# Patient Record
Sex: Female | Born: 1950 | Race: Black or African American | Hispanic: No | Smoking: Never smoker
Health system: Southern US, Community
[De-identification: ages and names within clinical notes are randomized; demographics above are authoritative.]

## PROBLEM LIST (undated history)

## (undated) DIAGNOSIS — M1990 Unspecified osteoarthritis, unspecified site: Secondary | ICD-10-CM

## (undated) DIAGNOSIS — N183 Chronic kidney disease, stage 3 (moderate): Secondary | ICD-10-CM

## (undated) DIAGNOSIS — I2510 Atherosclerotic heart disease of native coronary artery without angina pectoris: Secondary | ICD-10-CM

## (undated) DIAGNOSIS — Z992 Dependence on renal dialysis: Secondary | ICD-10-CM

## (undated) DIAGNOSIS — D649 Anemia, unspecified: Secondary | ICD-10-CM

## (undated) DIAGNOSIS — E109 Type 1 diabetes mellitus without complications: Secondary | ICD-10-CM

## (undated) DIAGNOSIS — K219 Gastro-esophageal reflux disease without esophagitis: Secondary | ICD-10-CM

## (undated) DIAGNOSIS — G4730 Sleep apnea, unspecified: Secondary | ICD-10-CM

## (undated) DIAGNOSIS — M545 Low back pain: Secondary | ICD-10-CM

## (undated) DIAGNOSIS — I209 Angina pectoris, unspecified: Secondary | ICD-10-CM

## (undated) DIAGNOSIS — I10 Essential (primary) hypertension: Secondary | ICD-10-CM

## (undated) DIAGNOSIS — E785 Hyperlipidemia, unspecified: Secondary | ICD-10-CM

## (undated) DIAGNOSIS — R0602 Shortness of breath: Secondary | ICD-10-CM

## (undated) DIAGNOSIS — M791 Myalgia, unspecified site: Secondary | ICD-10-CM

## (undated) DIAGNOSIS — M25512 Pain in left shoulder: Secondary | ICD-10-CM

## (undated) DIAGNOSIS — N186 End stage renal disease: Secondary | ICD-10-CM

## (undated) HISTORY — DX: Myalgia, unspecified site: M79.10

## (undated) HISTORY — PX: CATARACT EXTRACTION, BILATERAL: SHX1313

## (undated) HISTORY — DX: Essential (primary) hypertension: I10

## (undated) HISTORY — PX: CHOLECYSTECTOMY: SHX55

## (undated) HISTORY — DX: Hyperlipidemia, unspecified: E78.5

## (undated) HISTORY — DX: Low back pain: M54.5

## (undated) HISTORY — DX: Atherosclerotic heart disease of native coronary artery without angina pectoris: I25.10

## (undated) HISTORY — PX: ABDOMINAL HYSTERECTOMY: SHX81

## (undated) HISTORY — DX: Anemia, unspecified: D64.9

## (undated) HISTORY — DX: Chronic kidney disease, stage 3 (moderate): N18.3

## (undated) HISTORY — PX: APPENDECTOMY: SHX54

## (undated) HISTORY — DX: Pain in left shoulder: M25.512

## (undated) HISTORY — DX: Type 1 diabetes mellitus without complications: E10.9

---

## 1978-09-30 DIAGNOSIS — E109 Type 1 diabetes mellitus without complications: Secondary | ICD-10-CM | POA: Insufficient documentation

## 1996-04-24 HISTORY — PX: DOPPLER ECHOCARDIOGRAPHY: SHX263

## 2003-04-25 HISTORY — PX: CORONARY ANGIOPLASTY WITH STENT PLACEMENT: SHX49

## 2006-04-24 HISTORY — PX: CARDIAC CATHETERIZATION: SHX172

## 2008-04-24 HISTORY — PX: CARDIAC CATHETERIZATION: SHX172

## 2008-09-27 ENCOUNTER — Ambulatory Visit: Payer: Self-pay | Admitting: *Deleted

## 2008-09-27 ENCOUNTER — Inpatient Hospital Stay (HOSPITAL_COMMUNITY): Admission: EM | Admit: 2008-09-27 | Discharge: 2008-09-29 | Payer: Self-pay | Admitting: Emergency Medicine

## 2008-09-27 LAB — CONVERTED CEMR LAB: Hgb A1c MFr Bld: 7.7 %

## 2008-09-28 ENCOUNTER — Ambulatory Visit: Payer: Self-pay | Admitting: Vascular Surgery

## 2008-09-28 ENCOUNTER — Encounter (INDEPENDENT_AMBULATORY_CARE_PROVIDER_SITE_OTHER): Payer: Self-pay | Admitting: Emergency Medicine

## 2008-09-28 LAB — CONVERTED CEMR LAB: LDL Cholesterol: 86 mg/dL

## 2008-09-29 ENCOUNTER — Encounter (INDEPENDENT_AMBULATORY_CARE_PROVIDER_SITE_OTHER): Payer: Self-pay | Admitting: *Deleted

## 2008-09-29 DIAGNOSIS — I2510 Atherosclerotic heart disease of native coronary artery without angina pectoris: Secondary | ICD-10-CM

## 2008-09-29 DIAGNOSIS — D649 Anemia, unspecified: Secondary | ICD-10-CM | POA: Insufficient documentation

## 2008-09-29 DIAGNOSIS — E109 Type 1 diabetes mellitus without complications: Secondary | ICD-10-CM

## 2008-09-29 DIAGNOSIS — Z9089 Acquired absence of other organs: Secondary | ICD-10-CM | POA: Insufficient documentation

## 2008-09-29 DIAGNOSIS — I10 Essential (primary) hypertension: Secondary | ICD-10-CM | POA: Insufficient documentation

## 2008-09-29 DIAGNOSIS — J45909 Unspecified asthma, uncomplicated: Secondary | ICD-10-CM | POA: Insufficient documentation

## 2008-09-29 HISTORY — DX: Essential (primary) hypertension: I10

## 2008-09-29 HISTORY — DX: Anemia, unspecified: D64.9

## 2008-09-29 HISTORY — DX: Atherosclerotic heart disease of native coronary artery without angina pectoris: I25.10

## 2008-09-29 HISTORY — DX: Type 1 diabetes mellitus without complications: E10.9

## 2008-10-06 ENCOUNTER — Ambulatory Visit: Payer: Self-pay | Admitting: Internal Medicine

## 2008-10-06 ENCOUNTER — Encounter (INDEPENDENT_AMBULATORY_CARE_PROVIDER_SITE_OTHER): Payer: Self-pay | Admitting: *Deleted

## 2008-10-06 LAB — CONVERTED CEMR LAB: Blood Glucose, Fingerstick: 305

## 2009-01-13 ENCOUNTER — Telehealth (INDEPENDENT_AMBULATORY_CARE_PROVIDER_SITE_OTHER): Payer: Self-pay | Admitting: *Deleted

## 2009-01-25 ENCOUNTER — Telehealth (INDEPENDENT_AMBULATORY_CARE_PROVIDER_SITE_OTHER): Payer: Self-pay | Admitting: Internal Medicine

## 2009-02-16 ENCOUNTER — Encounter (INDEPENDENT_AMBULATORY_CARE_PROVIDER_SITE_OTHER): Payer: Self-pay | Admitting: Internal Medicine

## 2009-02-16 ENCOUNTER — Ambulatory Visit: Payer: Self-pay | Admitting: Internal Medicine

## 2009-02-16 ENCOUNTER — Ambulatory Visit (HOSPITAL_COMMUNITY): Admission: RE | Admit: 2009-02-16 | Discharge: 2009-02-16 | Payer: Self-pay | Admitting: Internal Medicine

## 2009-02-16 DIAGNOSIS — R079 Chest pain, unspecified: Secondary | ICD-10-CM | POA: Insufficient documentation

## 2009-02-16 DIAGNOSIS — M545 Low back pain, unspecified: Secondary | ICD-10-CM | POA: Insufficient documentation

## 2009-02-16 DIAGNOSIS — IMO0001 Reserved for inherently not codable concepts without codable children: Secondary | ICD-10-CM | POA: Insufficient documentation

## 2009-02-16 DIAGNOSIS — M791 Myalgia, unspecified site: Secondary | ICD-10-CM

## 2009-02-16 HISTORY — DX: Low back pain, unspecified: M54.50

## 2009-02-16 HISTORY — DX: Myalgia, unspecified site: M79.10

## 2009-02-16 LAB — CONVERTED CEMR LAB
Blood Glucose, Fingerstick: 126
Cholesterol: 114 mg/dL (ref 0–200)
HDL: 32 mg/dL — ABNORMAL LOW (ref >39)
Hgb A1c MFr Bld: 8.7 %
LDL Cholesterol: 59 mg/dL (ref 0–99)
Total CHOL/HDL Ratio: 3.6
Triglycerides: 114 mg/dL (ref <150)
VLDL: 23 mg/dL (ref 0–40)

## 2009-02-17 ENCOUNTER — Encounter (INDEPENDENT_AMBULATORY_CARE_PROVIDER_SITE_OTHER): Payer: Self-pay | Admitting: Internal Medicine

## 2009-02-17 ENCOUNTER — Telehealth: Payer: Self-pay | Admitting: *Deleted

## 2009-02-22 LAB — CONVERTED CEMR LAB
BUN: 17 mg/dL (ref 6–23)
CO2: 29 meq/L (ref 19–32)
Calcium: 8.8 mg/dL (ref 8.4–10.5)
Chloride: 104 meq/L (ref 96–112)
Creatinine, Ser: 1.6 mg/dL — ABNORMAL HIGH (ref 0.40–1.20)
Glucose, Bld: 100 mg/dL — ABNORMAL HIGH (ref 70–99)
Potassium: 4.1 meq/L (ref 3.5–5.3)
Sodium: 137 meq/L (ref 135–145)
Total CK: 95 units/L (ref 7–177)

## 2009-03-02 ENCOUNTER — Telehealth: Payer: Self-pay | Admitting: *Deleted

## 2009-03-08 ENCOUNTER — Ambulatory Visit (HOSPITAL_COMMUNITY): Admission: RE | Admit: 2009-03-08 | Discharge: 2009-03-08 | Payer: Self-pay | Admitting: Cardiovascular Disease

## 2009-03-16 ENCOUNTER — Ambulatory Visit: Payer: Self-pay | Admitting: Internal Medicine

## 2009-03-16 DIAGNOSIS — M25512 Pain in left shoulder: Secondary | ICD-10-CM

## 2009-03-16 DIAGNOSIS — M25519 Pain in unspecified shoulder: Secondary | ICD-10-CM | POA: Insufficient documentation

## 2009-03-16 HISTORY — DX: Pain in left shoulder: M25.512

## 2009-03-30 ENCOUNTER — Encounter (INDEPENDENT_AMBULATORY_CARE_PROVIDER_SITE_OTHER): Payer: Self-pay | Admitting: Internal Medicine

## 2009-03-31 ENCOUNTER — Ambulatory Visit (HOSPITAL_COMMUNITY): Admission: RE | Admit: 2009-03-31 | Discharge: 2009-03-31 | Payer: Self-pay | Admitting: Internal Medicine

## 2009-04-07 ENCOUNTER — Encounter: Admission: RE | Admit: 2009-04-07 | Discharge: 2009-04-07 | Payer: Self-pay | Admitting: Internal Medicine

## 2009-04-20 ENCOUNTER — Telehealth (INDEPENDENT_AMBULATORY_CARE_PROVIDER_SITE_OTHER): Payer: Self-pay | Admitting: *Deleted

## 2009-08-03 ENCOUNTER — Telehealth (INDEPENDENT_AMBULATORY_CARE_PROVIDER_SITE_OTHER): Payer: Self-pay | Admitting: Internal Medicine

## 2009-08-10 ENCOUNTER — Telehealth (INDEPENDENT_AMBULATORY_CARE_PROVIDER_SITE_OTHER): Payer: Self-pay | Admitting: Internal Medicine

## 2009-08-12 ENCOUNTER — Telehealth (INDEPENDENT_AMBULATORY_CARE_PROVIDER_SITE_OTHER): Payer: Self-pay | Admitting: Internal Medicine

## 2009-09-06 ENCOUNTER — Telehealth (INDEPENDENT_AMBULATORY_CARE_PROVIDER_SITE_OTHER): Payer: Self-pay | Admitting: Internal Medicine

## 2009-10-01 ENCOUNTER — Telehealth (INDEPENDENT_AMBULATORY_CARE_PROVIDER_SITE_OTHER): Payer: Self-pay | Admitting: Internal Medicine

## 2009-10-06 ENCOUNTER — Encounter: Admission: RE | Admit: 2009-10-06 | Discharge: 2009-10-06 | Payer: Self-pay | Admitting: Internal Medicine

## 2009-10-09 ENCOUNTER — Encounter (INDEPENDENT_AMBULATORY_CARE_PROVIDER_SITE_OTHER): Payer: Self-pay | Admitting: Internal Medicine

## 2009-10-20 ENCOUNTER — Ambulatory Visit: Payer: Self-pay | Admitting: Internal Medicine

## 2009-10-20 DIAGNOSIS — N184 Chronic kidney disease, stage 4 (severe): Secondary | ICD-10-CM

## 2009-10-20 DIAGNOSIS — N183 Chronic kidney disease, stage 3 unspecified: Secondary | ICD-10-CM

## 2009-10-20 HISTORY — DX: Chronic kidney disease, stage 4 (severe): N18.4

## 2009-10-20 HISTORY — DX: Chronic kidney disease, stage 3 unspecified: N18.30

## 2009-10-20 LAB — CONVERTED CEMR LAB
Blood Glucose, AC Bkfst: 218 mg/dL
Hgb A1c MFr Bld: 8.6 %

## 2009-10-21 ENCOUNTER — Encounter: Payer: Self-pay | Admitting: Internal Medicine

## 2009-10-21 DIAGNOSIS — E785 Hyperlipidemia, unspecified: Secondary | ICD-10-CM | POA: Insufficient documentation

## 2009-10-21 HISTORY — DX: Hyperlipidemia, unspecified: E78.5

## 2009-10-21 LAB — CONVERTED CEMR LAB
ALT: <8 units/L (ref 0–35)
AST: 10 units/L (ref 0–37)
Albumin: 3.6 g/dL (ref 3.5–5.2)
Alkaline Phosphatase: 66 units/L (ref 39–117)
BUN: 15 mg/dL (ref 6–23)
CO2: 22 meq/L (ref 19–32)
Calcium: 8.6 mg/dL (ref 8.4–10.5)
Chloride: 103 meq/L (ref 96–112)
Cholesterol: 114 mg/dL (ref 0–200)
Creatinine, Ser: 1.66 mg/dL — ABNORMAL HIGH (ref 0.40–1.20)
Glucose, Bld: 236 mg/dL — ABNORMAL HIGH (ref 70–99)
HCT: 32.6 % — ABNORMAL LOW (ref 36.0–46.0)
HDL: 37 mg/dL — ABNORMAL LOW (ref >39)
Hemoglobin: 10.3 g/dL — ABNORMAL LOW (ref 12.0–15.0)
LDL Cholesterol: 57 mg/dL (ref 0–99)
MCHC: 31.6 g/dL (ref 30.0–36.0)
MCV: 86.2 fL (ref >=78.0)
Platelets: 230 10*3/uL (ref 150–400)
Potassium: 4.2 meq/L (ref 3.5–5.3)
RBC: 3.78 M/uL — ABNORMAL LOW (ref 3.87–5.11)
RDW: 15.6 % — ABNORMAL HIGH (ref 11.5–15.5)
Sodium: 139 meq/L (ref 135–145)
Total Bilirubin: 0.3 mg/dL (ref 0.3–1.2)
Total CHOL/HDL Ratio: 3.1
Total CK: 107 units/L (ref 7–177)
Total Protein: 6.6 g/dL (ref 6.0–8.3)
Triglycerides: 101 mg/dL (ref <150)
VLDL: 20 mg/dL (ref 0–40)
WBC: 6.9 10*3/uL (ref 4.0–10.5)

## 2009-10-27 ENCOUNTER — Encounter (INDEPENDENT_AMBULATORY_CARE_PROVIDER_SITE_OTHER): Payer: Self-pay | Admitting: *Deleted

## 2009-10-28 ENCOUNTER — Telehealth: Payer: Self-pay | Admitting: *Deleted

## 2009-10-29 ENCOUNTER — Telehealth: Payer: Self-pay | Admitting: Internal Medicine

## 2009-10-30 ENCOUNTER — Encounter: Payer: Self-pay | Admitting: Internal Medicine

## 2009-11-01 ENCOUNTER — Encounter: Payer: Self-pay | Admitting: Internal Medicine

## 2009-11-01 ENCOUNTER — Ambulatory Visit: Payer: Self-pay | Admitting: Internal Medicine

## 2009-11-01 ENCOUNTER — Telehealth: Payer: Self-pay | Admitting: Internal Medicine

## 2009-11-01 ENCOUNTER — Encounter (INDEPENDENT_AMBULATORY_CARE_PROVIDER_SITE_OTHER): Payer: Self-pay | Admitting: *Deleted

## 2009-11-01 LAB — CONVERTED CEMR LAB
Calcium, Total (PTH): 9.6 mg/dL (ref 8.4–10.5)
Ferritin: 255 ng/mL (ref 10–291)
PTH: 59.4 pg/mL (ref 14.0–72.0)
Phosphorus: 2.6 mg/dL (ref 2.3–4.6)
Retic Ct Pct: 1.6 % (ref 0.4–3.1)
Vitamin B-12: 468 pg/mL (ref 211–911)

## 2009-11-03 ENCOUNTER — Encounter: Payer: Self-pay | Admitting: Internal Medicine

## 2009-11-16 ENCOUNTER — Encounter (INDEPENDENT_AMBULATORY_CARE_PROVIDER_SITE_OTHER): Payer: Self-pay | Admitting: *Deleted

## 2009-11-18 ENCOUNTER — Encounter (INDEPENDENT_AMBULATORY_CARE_PROVIDER_SITE_OTHER): Payer: Self-pay | Admitting: *Deleted

## 2009-11-18 ENCOUNTER — Ambulatory Visit: Payer: Self-pay | Admitting: Internal Medicine

## 2009-11-19 ENCOUNTER — Ambulatory Visit: Payer: Self-pay | Admitting: Internal Medicine

## 2009-11-19 ENCOUNTER — Encounter
Admission: RE | Admit: 2009-11-19 | Discharge: 2010-01-04 | Payer: Self-pay | Admitting: Physical Medicine and Rehabilitation

## 2009-11-21 LAB — CONVERTED CEMR LAB
ALT: 8 units/L (ref 0–35)
AST: 13 units/L (ref 0–37)
Albumin: 3.9 g/dL (ref 3.5–5.2)
Alkaline Phosphatase: 70 units/L (ref 39–117)
BUN: 23 mg/dL (ref 6–23)
CO2: 25 meq/L (ref 19–32)
Calcium: 9.1 mg/dL (ref 8.4–10.5)
Chloride: 107 meq/L (ref 96–112)
Creatinine, Ser: 1.64 mg/dL — ABNORMAL HIGH (ref 0.40–1.20)
Glucose, Bld: 116 mg/dL — ABNORMAL HIGH (ref 70–99)
Potassium: 4.5 meq/L (ref 3.5–5.3)
Sodium: 141 meq/L (ref 135–145)
Total Bilirubin: 0.2 mg/dL — ABNORMAL LOW (ref 0.3–1.2)
Total Protein: 7.1 g/dL (ref 6.0–8.3)

## 2009-12-02 ENCOUNTER — Ambulatory Visit: Payer: Self-pay | Admitting: Internal Medicine

## 2009-12-04 ENCOUNTER — Encounter: Payer: Self-pay | Admitting: Internal Medicine

## 2009-12-20 ENCOUNTER — Ambulatory Visit: Payer: Self-pay | Admitting: Internal Medicine

## 2009-12-20 ENCOUNTER — Telehealth: Payer: Self-pay | Admitting: Internal Medicine

## 2009-12-20 LAB — CONVERTED CEMR LAB: Blood Glucose, Fingerstick: 144

## 2009-12-24 ENCOUNTER — Telehealth: Payer: Self-pay | Admitting: Internal Medicine

## 2010-02-21 ENCOUNTER — Telehealth: Payer: Self-pay | Admitting: Internal Medicine

## 2010-03-14 ENCOUNTER — Telehealth: Payer: Self-pay | Admitting: Internal Medicine

## 2010-05-04 ENCOUNTER — Encounter
Admission: RE | Admit: 2010-05-04 | Discharge: 2010-05-04 | Payer: Self-pay | Source: Home / Self Care | Attending: Internal Medicine | Admitting: Internal Medicine

## 2010-05-24 NOTE — Assessment & Plan Note (Signed)
Summary: NOT HFU/FU PER DR/CFB   Vital Signs:  Patient profile:   61 year old female Height:      63 inches (160.02 cm) Weight:      269.2 pounds (122.36 kg) BMI:     47.86 Temp:     97.2 degrees F Pulse rate:   88 / minute BP sitting:   144 / 77  (right arm) Cuff size:   large  Vitals Entered By: Yvonna Alanis RN (February 16, 2009 2:07 PM) CC: follow up visit- bumped left great toe into counter about 2 weeks ago - still sore - not red or inflamed - also c/o chest pain with activity since Isosorbide discontinues Is Patient Diabetic? Yes Did you bring your meter with you today? Yes Nutritional Status BMI of > 30 = obese CBG Result 126  Have you ever been in a relationship where you felt threatened, hurt or afraid?No   Does patient need assistance? Functional Status Self care Ambulation Normal   CC:  follow up visit- bumped left great toe into counter about 2 weeks ago - still sore - not red or inflamed - also c/o chest pain with activity since Isosorbide discontinues.  History of Present Illness: Ann [name] is a 60 yo F with PMH of CAD s/p stent placement (@ [facility] in [place]) in 2005 who presents for routine f/u. She says she was taken off isosorbide and verapamil in June after being admitted for syncope and has been having worsening chest pain since. She had been on isosorbide since her stent placement in 2005 and said it helped her angina. Her chest pain has gotten to the point where she can't even walk from her bed to her bathroom without getting CP, and sometimes it even happens when she is just sitting. The pain goes down both arms, is associated with SOB, tingling in fingers, and nausea, and improves some with nitro and when she lays down. She previously saw Dr. Thurston Hole, cardiologist in [place], [place], but has not seen anyone about this since moving to [place] in June.   She also c/o LBP during standing and walking that goes down her R leg to her R knee/foot (she reports  that she was told she has a "slipped disc"). She also complains of chronically "sore legs".   Preventive Screening-Counseling & Management  Alcohol-Tobacco     Alcohol drinks/day: 0     Smoking Status: never  Caffeine-Diet-Exercise     Does Patient Exercise: yes     Type of exercise: walking  Allergies: 1)  ! Codeine 2)  ! Flagyl  Review of Systems CV:  Complains of chest pain or discomfort and shortness of breath with exertion. Resp:  Complains of shortness of breath. MS:  Complains of low back pain and muscle aches.  Physical Exam  General:  Well-developed,well-nourished,in no acute distress; alert,appropriate and cooperative throughout examination Head:  Normocephalic and atraumatic without obvious abnormalities. No apparent alopecia or balding. Lungs:  Normal respiratory effort, chest expands symmetrically. Lungs are clear to auscultation, no crackles or wheezes. Heart:  Normal rate and regular rhythm. S1 and S2 normal without gallop, murmur, click, rub or other extra sounds. Abdomen:  Bowel sounds positive, pt is obese Msk:  LBP is non-tender to palpation, straight leg test negative Pulses:  R dorsalis pedis decreased and L dorsalis pedis decreased.   Extremities:  no edema Neurologic:  alert & oriented X3.   Psych:  Cognition and judgment appear intact. Alert and cooperative with normal  attention span and concentration. No apparent delusions, illusions, hallucinations   Impression & Recommendations:  Problem # 1:  CHEST PAIN (ICD-786.50) Pt with significant CAD history worsening angina-like pain since her isosorbide was discontinued in June. She has not been seen by a physician since this time. Her symptoms are concerning for unstable angina given the severity of the symptoms and that they can occur at rest. EKG was normal but she does seem to have a new interventricular conduction delay.  Will check lipid profile (pt says she has not eating or drank anything yet today).  Spoke with [facility] cardiology and they agreed to see patient tomorrow at 11 am. Pt was given voucher for taxi cab due to transportation issue.  Orders: 12 Lead EKG (12 Lead EKG) Cardiology Referral (Cardiology)  Problem # 2:  DIABETES MELLITUS, INSULIN DEPENDENT (IDDM) (ICD-250.01) Patient says her CBGs have been < 160 pre-meal and  ~ 200 post-meal, but a1c = 8.7 doesn't bear this out. Pt has not seen Ann [name] in the past and she has appt openings today, so I will send her over after this appt to talk to her. I will have her come back in 2-4 weeks to possibly adjust her diabetes regimen.  Her updated medication list for this problem includes:    Aspirin 81 Mg Tbec (Aspirin) .Marland Kitchen... Take 1 tablet by mouth once a day    Glimepiride 4 Mg Tabs (Glimepiride) .Marland Kitchen... Take 1 tablet by mouth once a day    Lantus 100 Unit/ml Soln (Insulin glargine) .Marland Kitchen... Take 20 units at bedtime    Avalide 300-25 Mg Tabs (Irbesartan-hydrochlorothiazide) .Marland Kitchen... Take one tab by mouth once daily  Orders: T-Hgb A1C (in-house) [id number]) T- Capillary Blood Glucose [id number]) Diabetic Clinic Referral (Diabetic)  Problem # 3:  MYALGIA (ICD-729.1) Pt with "sore legs", has been ongoing for some time. May be related to her knee OA but could also be statin-related myalgia. Will check CK and Bmet.   Her updated medication list for this problem includes:    Aspirin 81 Mg Tbec (Aspirin) .Marland Kitchen... Take 1 tablet by mouth once a day  Orders: T-CK Total ([id number]) T-Basic Metabolic Panel ([id number])  Problem # 4:  BACK PAIN, LUMBAR (ICD-724.2) Pt with chronic LBP and a report of a "slipped disc", pain goes down R leg. Straight leg test neg and low back is non-tender to palpation. This is likely MSK-related, may be herniated disc or just spasm. Will suggest heating pad, weight loss, and ibuprofen as needed.  Her updated medication list for this problem includes:    Aspirin 81 Mg Tbec (Aspirin) .Marland Kitchen... Take 1 tablet by mouth once  a day  Complete Medication List: 1)  Aspirin 81 Mg Tbec (Aspirin) .... Take 1 tablet by mouth once a day 2)  Claritin 10 Mg Caps (Loratadine) .... Take 1 tablet by mouth once a day 3)  Glimepiride 4 Mg Tabs (Glimepiride) .... Take 1 tablet by mouth once a day 4)  Plavix 75 Mg Tabs (Clopidogrel bisulfate) .... Take 1 tablet by mouth once a day 5)  Protonix 20 Mg Tbec (Pantoprazole sodium) .... Take 1 tablet by mouth once a day 6)  Crestor 40 Mg Tabs (Rosuvastatin calcium) 7)  Lantus 100 Unit/ml Soln (Insulin glargine) .... Take 20 units at bedtime 8)  Ventolin Hfa 108 (90 Base) Mcg/act Aers (Albuterol sulfate) .... Take 1-2 puffs every four hours as needed for shortness of breath. 9)  Senokot S 8.6-50 Mg Tabs (Sennosides-docusate sodium) .... Take one tab  once a day as needed for constipation 10)  Avalide 300-25 Mg Tabs (Irbesartan-hydrochlorothiazide) .... Take one tab by mouth once daily  Other Orders: T-Lipid Profile [id number])  Patient Instructions: 1)  Please schedule a follow-up appointment in 3-4 weeks. 2)  We have set up an appointment for you to see a cardiologist at [facility] tomorrow, 10/27, at 10:30 am. Please make sure you keep this appointment.  Prescriptions: AVALIDE 300-25 MG TABS (IRBESARTAN-HYDROCHLOROTHIAZIDE) Take one tab by mouth once daily  #30 x 3   Entered and Authorized by:   Neta Mends MD   Signed by:   Neta Mends MD on 02/16/2009   Method used:   Print then Give to Patient   RxID:   [id number] [place] S 8.6-50 MG TABS (SENNOSIDES-DOCUSATE SODIUM) Take one tab once a day as needed for constipation  #30 x 3   Entered and Authorized by:   Neta Mends MD   Signed by:   Neta Mends MD on 02/16/2009   Method used:   Print then Give to Patient   RxID:   [id number] VENTOLIN HFA 108 (90 BASE) MCG/ACT AERS (ALBUTEROL SULFATE) Take 1-2 puffs every four hours as needed for shortness of breath.  #1 x 2   Entered and Authorized  by:   Neta Mends MD   Signed by:   Neta Mends MD on 02/16/2009   Method used:   Print then Give to Patient   [id number] LANTUS 100 UNIT/ML SOLN (INSULIN GLARGINE) Take 20 units at bedtime  #1 month x 3   Entered and Authorized by:   Neta Mends MD   Signed by:   Neta Mends MD on 02/16/2009   Method used:   Print then Give to Patient   [id number] CRESTOR 40 MG TABS (ROSUVASTATIN CALCIUM)   #30 x 3   Entered and Authorized by:   Neta Mends MD   Signed by:   Neta Mends MD on 02/16/2009   Method used:   Print then Give to Patient   [id number] PROTONIX 20 MG TBEC (PANTOPRAZOLE SODIUM) Take 1 tablet by mouth once a day  #30 x 3   Entered and Authorized by:   Neta Mends MD   Signed by:   Neta Mends MD on 02/16/2009   Method used:   Print then Give to Patient   [id number] PLAVIX 15 MG TABS (CLOPIDOGREL BISULFATE) Take 1 tablet by mouth once a day  #30 x 3   Entered and Authorized by:   Neta Mends MD   Signed by:   Neta Mends MD on 02/16/2009   Method used:   Print then Give to Patient   [id number] GLIMEPIRIDE 4 MG TABS (GLIMEPIRIDE) Take 1 tablet by mouth once a day  #30 x 3   Entered and Authorized by:   Neta Mends MD   Signed by:   Neta Mends MD on 02/16/2009   Method used:   Print then Give to Patient   [id number]   Prevention & Chronic Care Immunizations   Influenza vaccine: Not documented    Tetanus booster: Not documented    Pneumococcal vaccine: Not documented  Colorectal Screening   Hemoccult: Not documented    Colonoscopy: Not documented  Other Screening   Pap smear: Not documented    Mammogram: Not documented   Smoking status: never  (02/16/2009)  Diabetes Mellitus   HgbA1C: 8.7  (02/16/2009)  HgbA1C action/deferral: Ordered  (02/16/2009)    Eye exam: Not documented    Foot exam: yes  (10/06/2008)   High risk foot: Not  documented   Foot care education: Done  (10/06/2008)    Urine microalbumin/creatinine ratio: Not documented  Lipids   Total Cholesterol: Not documented   LDL: 86  (09/28/2008)   LDL Direct: Not documented   HDL: Not documented   Triglycerides: Not documented  Hypertension   Last Blood Pressure: 144 / 77  (02/16/2009)   Serum creatinine: Not documented   Serum potassium Not documented  Self-Management Support :    Patient will work on the following items until the next clinic visit to reach self-care goals:     Medications and monitoring: take my medicines every day, check my blood sugar, bring all of my medications to every visit, examine my feet every day  (02/16/2009)     Eating: drink diet soda or water instead of juice or soda, use fresh or frozen vegetables, eat baked foods instead of fried foods, eat fruit for snacks and desserts  (02/16/2009)     Other: going up and down stairs for exercise (and walk) - chest pain stops her from exercising since stopped Isosorbide  (02/16/2009)    Diabetes self-management support: Not documented   Referred for diabetes self-mgmt training.    Hypertension self-management support: Not documented   Nursing Instructions: HgbA1C today (see order) CBG today (see order)   Process Orders Check Orders Results:     [facility]: ABN not required for this insurance Tests Sent for requisitioning (February 16, 2009 4:21 PM):     02/16/2009: [facility] -- T-CK Total [id number] (signed)     02/16/2009: [facility] -- T-Basic Metabolic Panel [id number] (signed)     02/16/2009: [facility] -- T-Lipid Profile [phone] (signed)   Laboratory Results   Blood Tests   Date/Time Received: February 16, 2009 2:28 PM Date/Time Reported: Maryan Rued  February 16, 2009 2:28 PM   HGBA1C: 8.7%   (Normal Range: Non-Diabetic - 3-6%   Control Diabetic - 6-8%) CBG Random:: 126mg /dL        Appended Document: Orders Update    Clinical Lists Changes  Orders: Added new Service order of Est. Patient Level III [id number]) - Signed

## 2010-05-24 NOTE — Assessment & Plan Note (Signed)
Summary: ACUTE LAB WORK PER DR ALVAREZ/TOBBIA/1 MONTH F/U VISIT PER AL...   Vital Signs:  Patient profile:   60 year old female Height:      63 inches (160.02 cm) Weight:      265.1 pounds (120.50 kg) BMI:     47.13 Temp:     97.5 degrees F (36.39 degrees C) oral Pulse rate:   70 / minute BP sitting:   193 / 84  (left arm) Cuff size:   large  Vitals Entered By: Lucky Rathke NT II (November 19, 2009 2:06 PM) CC: PATIENT IS HERE FOR LAB  / 1 MONTH FOLLOW UP APPT/ HAVING STIFFNESS -RIGHT SIDE OF NECK Is Patient Diabetic? Yes Did you bring your meter with you today? No Pain Assessment Patient in pain? no      Nutritional Status BMI of > 30 = obese  Have you ever been in a relationship where you felt threatened, hurt or afraid?No   Does patient need assistance? Functional Status Self care Ambulation Normal Comments PATIENT IS HERE FOR LAB / 1 MONTH FOLLOW UP APPT /  HAVING STIFFNESS -RIGHT SIDE OF NECK   CC:  PATIENT IS HERE FOR LAB  / 1 MONTH FOLLOW UP APPT/ HAVING STIFFNESS -RIGHT SIDE OF NECK.  History of Present Illness: 60 y/o female with pmh as described on the EMR;who comes to the clinic for followup of his BP and to have labs reviewed.  Labs has demonstrated that her PTH is normal together with the rest of his electrolytes.  Patient is having anemiaof chronic disease most likely due to renal failure.  She is not having nay complaints; is doing ok and feeling well.  BP continue to be elevated; she has some issues witht he prescription, which were not filled properly for her and is currently using 55 units of lantus with fasting CBG's in the 120-130's range.  Preventive Screening-Counseling & Management  Alcohol-Tobacco     Alcohol drinks/day: 0     Smoking Status: never  Caffeine-Diet-Exercise     Does Patient Exercise: yes     Type of exercise: walking upstairs     Times/week: 7  Problems Prior to Update: 1)  Dyslipidemia  (ICD-272.4) 2)  Chronic Kidney  Disease Stage Iii (MODERATE)  (ICD-585.3) 3)  Shoulder Pain, Left  (ICD-719.41) 4)  Back Pain, Lumbar  (ICD-724.2) 5)  Myalgia  (ICD-729.1) 6)  Chest Pain  (ICD-786.50) 7)  Appendectomy, Hx of  (ICD-V45.79) 8)  Cholecystectomy, Hx of  (ICD-V45.79) 9)  Knee Joint Replacement By Other Means  (ICD-V43.65) 10)  Asthma, Unspecified, Unspecified Status  (ICD-493.90) 11)  Cad  (ICD-414.00) 12)  Unspecified Anemia  (ICD-285.9) 13)  Diabetes Mellitus, Insulin Dependent (IDDM)  (ICD-250.01) 14)  Hypertension  (ICD-401.1)  Current Problems (verified): 1)  Dyslipidemia  (ICD-272.4) 2)  Chronic Kidney Disease Stage Iii (MODERATE)  (ICD-585.3) 3)  Shoulder Pain, Left  (ICD-719.41) 4)  Back Pain, Lumbar  (ICD-724.2) 5)  Myalgia  (ICD-729.1) 6)  Chest Pain  (ICD-786.50) 7)  Appendectomy, Hx of  (ICD-V45.79) 8)  Cholecystectomy, Hx of  (ICD-V45.79) 9)  Knee Joint Replacement By Other Means  (ICD-V43.65) 10)  Asthma, Unspecified, Unspecified Status  (ICD-493.90) 11)  Cad  (ICD-414.00) 12)  Unspecified Anemia  (ICD-285.9) 13)  Diabetes Mellitus, Insulin Dependent (IDDM)  (ICD-250.01) 14)  Hypertension  (ICD-401.1)  Medications Prior to Update: 1)  Aspirin 81 Mg Tbec (Aspirin) .... Take 1 Tablet By Mouth Once A Day 2)  Claritin 10 Mg  Caps (Loratadine) .... Take 1 Tablet By Mouth Once A Day 3)  Glimepiride 4 Mg Tabs (Glimepiride) .... Take 1 Tablet By Mouth Once A Day 4)  Plavix 75 Mg Tabs (Clopidogrel Bisulfate) .... Take 1 Tablet By Mouth Once A Day 5)  Protonix 20 Mg Tbec (Pantoprazole Sodium) .... Take 1 Tablet By Mouth Once A Day 6)  Lantus Solostar 100 Unit/ml Soln (Insulin Glargine) .... Inject 20 Units At Bedtime 7)  Ventolin Hfa 108 (90 Base) Mcg/act Aers (Albuterol Sulfate) .... Take 1-2 Puffs Every Four Hours As Needed For Shortness of Breath. 8)  Senokot S 8.6-50 Mg Tabs (Sennosides-Docusate Sodium) .... Take One Tab Once A Day As Needed For Constipation 9)   Lisinopril-Hydrochlorothiazide 20-12.5 Mg Tabs (Lisinopril-Hydrochlorothiazide) .... Take 1 Tablet By Mouth Once A Day 10)  Lopressor 50 Mg Tabs (Metoprolol Tartrate) .... Take 1 Tablet By Mouth Once A Day 11)  Isosorbide Dinitrate 30 Mg Tabs (Isosorbide Dinitrate) .Marland Kitchen.. 1 Tab Daily 12)  Nitrostat 0.4 Mg Subl (Nitroglycerin) .... Dissolve Under Tongue Every 5 Minutes As Needed For Chest Pain Up To 3 Doses 13)  Simvastatin 40 Mg Tabs (Simvastatin) 14)  Prodigy Blood Glucose Test  Strp (Glucose Blood) .... Use As Directed 15)  Prodigy Insulin Syringe 28g X 1/2" 1 Ml Misc (Insulin Syringe-Needle U-100) .... Use As Directed 16)  Prodigy Lancets 28g  Misc (Lancets) .... Use As Directed 17)  Blood Glucose Meter  Kit (Blood Glucose Monitoring Suppl) .... Use As Directed 18)  Niaspan 500 Mg Cr-Tabs (Niacin (Antihyperlipidemic)) .... Take 2 Tablet By Mouth Once A Day 19)  Miralax   Powd (Polyethylene Glycol 3350) .... As Per Prep  Instructions. 20)  Dulcolax 5 Mg  Tbec (Bisacodyl) .... Day Before Procedure Take 2 At 3pm and 2 At 8pm. 21)  Reglan 10 Mg  Tabs (Metoclopramide Hcl) .... As Per Prep Instructions.  Current Medications (verified): 1)  Aspirin 81 Mg Tbec (Aspirin) .... Take 1 Tablet By Mouth Once A Day 2)  Claritin 10 Mg Caps (Loratadine) .... Take 1 Tablet By Mouth Once A Day 3)  Glimepiride 4 Mg Tabs (Glimepiride) .... Take 1 Tablet By Mouth Once A Day 4)  Plavix 75 Mg Tabs (Clopidogrel Bisulfate) .... Take 1 Tablet By Mouth Once A Day 5)  Protonix 20 Mg Tbec (Pantoprazole Sodium) .... Take 1 Tablet By Mouth Once A Day 6)  Lantus Solostar 100 Unit/ml Soln (Insulin Glargine) .... Inject 55 Units At Bedtime 7)  Ventolin Hfa 108 (90 Base) Mcg/act Aers (Albuterol Sulfate) .... Take 1-2 Puffs Every Four Hours As Needed For Shortness of Breath. 8)  Senokot S 8.6-50 Mg Tabs (Sennosides-Docusate Sodium) .... Take One Tab Once A Day As Needed For Constipation 9)  Lisinopril-Hydrochlorothiazide 20-12.5  Mg Tabs (Lisinopril-Hydrochlorothiazide) .... Take 1 Tablet By Mouth Once A Day 10)  Lopressor 50 Mg Tabs (Metoprolol Tartrate) .... Take 1 Tablet By Mouth Twice A Day 11)  Isosorbide Dinitrate 30 Mg Tabs (Isosorbide Dinitrate) .Marland Kitchen.. 1 Tab Daily 12)  Nitrostat 0.4 Mg Subl (Nitroglycerin) .... Dissolve Under Tongue Every 5 Minutes As Needed For Chest Pain Up To 3 Doses 13)  Simvastatin 40 Mg Tabs (Simvastatin) 14)  Prodigy Blood Glucose Test  Strp (Glucose Blood) .... Use As Directed 15)  Prodigy Insulin Syringe 28g X 1/2" 1 Ml Misc (Insulin Syringe-Needle U-100) .... Use As Directed 16)  Prodigy Lancets 28g  Misc (Lancets) .... Use As Directed 17)  Blood Glucose Meter  Kit (Blood Glucose Monitoring Suppl) .Marland KitchenMarland KitchenMarland Kitchen  Use As Directed 18)  Niaspan 500 Mg Cr-Tabs (Niacin (Antihyperlipidemic)) .... Take 2 Tablet By Mouth Once A Day  Allergies (verified): 1)  ! Codeine 2)  ! Flagyl  Past History:  Past Medical History: Last updated: 10/20/2009 CAD, followed by Dr. Gwenlyn Found HTN HLD DM CKD, stage III Anemia, NOS OSA using CPAP OA  Past Surgical History: Last updated: 10/20/2009 s/p L TKR  Risk Factors: Alcohol Use: 0 (11/19/2009) Exercise: yes (11/19/2009)  Risk Factors: Smoking Status: never (11/19/2009)  Review of Systems       As per HPI.  Physical Exam  General:  obese, alert, cooperative, NAD Lungs:  normal respiratory effort, no accessory muscle use, normal breath sounds, no crackles, and no wheezes.   Heart:  normal rate, regular rhythm, no gallop, no rub, and no JVD.  grade 1/6 systolic murmur over outflow track. Abdomen:  soft, non-tender, and normal bowel sounds.   Neurologic:  alert & oriented X3, strength normal in all extremities, and gait normal.     Impression & Recommendations:  Problem # 1:  CHRONIC KIDNEY DISEASE STAGE III (MODERATE) (ICD-585.3) Patient electrolytes and PTH WNL. Will repeat Lab work today andwill advised her to follow a low sodium and low  protein diet. Willadjust BP meds for better control and will follow her in 1 month.  Orders: T-CMP with Estimated GFR ([phone])  Problem # 2:  HYPERTENSION (ICD-401.1) Not at goal. Will increased lisinopril/hctz to 2 tabs daily; will provide right prescription for increased lopressor and will encourage her to follow a low sodium diet.  Her updated medication list for this problem includes:    Lisinopril-hydrochlorothiazide 20-12.5 Mg Tabs (Lisinopril-hydrochlorothiazide) .Marland Kitchen... Take 2 tablet by mouth once a day    Lopressor 50 Mg Tabs (Metoprolol tartrate) .Marland Kitchen... Take 1 tablet by mouth twice a day  Problem # 3:  DIABETES MELLITUS, INSULIN DEPENDENT (IDDM) (ICD-250.01) Currently better control with new dose of 55 unitsof lantus daily. Will check A1C during next visit and will advise her to follow a low carb diet.  Her updated medication list for this problem includes:    Aspirin 81 Mg Tbec (Aspirin) .Marland Kitchen... Take 1 tablet by mouth once a day    Glimepiride 4 Mg Tabs (Glimepiride) .Marland Kitchen... Take 1 tablet by mouth once a day    Lantus Solostar 100 Unit/ml Soln (Insulin glargine) ..... Inject 55 units at bedtime    Lisinopril-hydrochlorothiazide 20-12.5 Mg Tabs (Lisinopril-hydrochlorothiazide) .Marland Kitchen... Take 2 tablet by mouth once a day  Problem # 4:  UNSPECIFIED ANEMIA (ICD-285.9) Anemiaofchronic disease; due to kidney failure. Will follow closely. Patient will arrange visit with [place] [facility]; she might benefit of starting relationship with kidney doctor and to decide best timing to start epogen.  Complete Medication List: 1)  Aspirin 81 Mg Tbec (Aspirin) .... Take 1 tablet by mouth once a day 2)  Claritin 10 Mg Caps (Loratadine) .... Take 1 tablet by mouth once a day 3)  Glimepiride 4 Mg Tabs (Glimepiride) .... Take 1 tablet by mouth once a day 4)  Plavix 75 Mg Tabs (Clopidogrel bisulfate) .... Take 1 tablet by mouth once a day 5)  Protonix 20 Mg Tbec (Pantoprazole sodium) .... Take 1 tablet by  mouth once a day 6)  Lantus Solostar 100 Unit/ml Soln (Insulin glargine) .... Inject 55 units at bedtime 7)  Ventolin Hfa 108 (90 Base) Mcg/act Aers (Albuterol sulfate) .... Take 1-2 puffs every four hours as needed for shortness of breath. 8)  Senokot S 8.6-50 Mg Tabs (  Sennosides-docusate sodium) .... Take one tab once a day as needed for constipation 9)  Lisinopril-hydrochlorothiazide 20-12.5 Mg Tabs (Lisinopril-hydrochlorothiazide) .... Take 2 tablet by mouth once a day 10)  Lopressor 50 Mg Tabs (Metoprolol tartrate) .... Take 1 tablet by mouth twice a day 11)  Isosorbide Dinitrate 30 Mg Tabs (Isosorbide dinitrate) .Marland Kitchen.. 1 tab daily 12)  Nitrostat 0.4 Mg Subl (Nitroglycerin) .... Dissolve under tongue every 5 minutes as needed for chest pain up to 3 doses 13)  Simvastatin 40 Mg Tabs (Simvastatin) 14)  Prodigy Blood Glucose Test Strp (Glucose blood) .... Use as directed 15)  Prodigy Insulin Syringe 28g X 1/2" 1 Ml Misc (Insulin syringe-needle u-100) .... Use as directed 16)  Prodigy Lancets 28g Misc (Lancets) .... Use as directed 17)  Blood Glucose Meter Kit (Blood glucose monitoring suppl) .... Use as directed 18)  Niaspan 500 Mg Cr-tabs (Niacin (antihyperlipidemic)) .... Take 2 tablet by mouth once a day  Patient Instructions: 1)  Take medications as prescribed. 2)  Follow a low sodium diet. 3)  Followup in 1 month for BP and diabetes checkup. 4)  Follow a low carbohydrates diet. 5)  You will be called with any abnormalities in the tests scheduled or performed today.  If you don't hear from [place] within a week from when the test was performed, you can assume that your test was normal. Prescriptions: LISINOPRIL-HYDROCHLOROTHIAZIDE 20-12.5 MG TABS (LISINOPRIL-HYDROCHLOROTHIAZIDE) Take 2 tablet by mouth once a day  #62 x 3   Entered and Authorized by:   Barton Dubois MD   Signed by:   Barton Dubois MD on 11/19/2009   Method used:   Print then Give to Patient   RxID:    [id number] LOPRESSOR 50 MG TABS (METOPROLOL TARTRATE) Take 1 tablet by mouth twice a day  #62 x 3   Entered and Authorized by:   Barton Dubois MD   Signed by:   Barton Dubois MD on 11/19/2009   Method used:   Print then Give to Patient   RxID:   [id number]  Process Orders Check Orders Results:     [facility]: ABN not required for this insurance Tests Sent for requisitioning (November 19, 2009 2:57 PM):     11/19/2009: [facility] -- T-CMP with Estimated GFR [id number] (signed)    Prevention & Chronic Care Immunizations   Influenza vaccine: Fluvax 3+  (03/16/2009)   Influenza vaccine deferral: Not available  (10/20/2009)    Tetanus booster: Not documented   Td booster deferral: Deferred  (10/20/2009)    Pneumococcal vaccine: Not documented    H. zoster vaccine: Not documented   H. zoster vaccine deferral: Not available  (10/20/2009)  Colorectal Screening   Hemoccult: Not documented    Colonoscopy: Not documented   Colonoscopy action/deferral: GI referral  (10/20/2009)  Other Screening   Pap smear: Not documented   Pap smear action/deferral: Not indicated S/P hysterectomy  (10/20/2009)    Mammogram: BI-RADS CATEGORY 3:  Probably benign finding(s) - short interval^MM DIGITAL DIAGNOSTIC UNILAT R  (10/06/2009)   Mammogram action/deferral: Ordered  (03/16/2009)   Mammogram due: 04/07/2010    DXA bone density scan: Not documented   Smoking status: never  (11/19/2009)  Diabetes Mellitus   HgbA1C: 8.6  (10/20/2009)   HgbA1C action/deferral: Ordered  (02/16/2009)    Eye exam: Not documented    Foot exam: yes  (10/06/2008)   High risk foot: Not documented   Foot care education: Done  (10/06/2008)    Urine microalbumin/creatinine  ratio: Not documented   Urine microalbumin action/deferral: Not indicated  Lipids   Total Cholesterol: 114  (10/21/2009)   LDL: 57  (10/21/2009)   LDL Direct: Not documented   HDL: 37   (10/21/2009)   Triglycerides: 101  (10/21/2009)    SGOT (AST): 10  (10/21/2009)   SGPT (ALT): <8 U/L  (10/21/2009)   Alkaline phosphatase: 66  (10/21/2009)   Total bilirubin: 0.3  (10/21/2009)  Hypertension   Last Blood Pressure: 193 / 84  (11/19/2009)   Serum creatinine: 1.66  (10/21/2009)   Serum potassium 4.2  (10/21/2009)  Self-Management Support :   Personal Goals (by the next clinic visit) :     Personal A1C goal: 6  (10/20/2009)     Personal blood pressure goal: 130/80  (10/20/2009)     Personal LDL goal: 100  (10/20/2009)    Patient will work on the following items until the next clinic visit to reach self-care goals:     Medications and monitoring: take my medicines every day, check my blood sugar, bring all of my medications to every visit, examine my feet every day  (11/19/2009)     Eating: drink diet soda or water instead of juice or soda, eat more vegetables, use fresh or frozen vegetables, eat foods that are low in salt, eat baked foods instead of fried foods, eat fruit for snacks and desserts, limit or avoid alcohol  (11/19/2009)     Activity: take a 30 minute walk every day  (03/16/2009)     Other: going up and down stairs for exercise (and walk) - chest pain stops her from exercising since stopped Isosorbide  (02/16/2009)    Diabetes self-management support: Resources for patients handout  (11/19/2009)    Hypertension self-management support: Resources for patients handout  (11/19/2009)    Lipid self-management support: Resources for patients handout  (11/19/2009)     Self-management comments: Drucilla Chalet UP /  DOWN      Resource handout printed.

## 2010-05-24 NOTE — Progress Notes (Signed)
Summary: change avalide/ hla  Phone Note From Pharmacy   Summary of Call: the previous note about the avalide change was not the correct meds that are approved by medicaid, the pt first must try and fail an ACE INHIBITOR/DIURETIC COMBO such as BENAZEPRIL/HCT OR CAPTOPRIL/HCTZ OR ENALAPRIL/HCTZ OR FOSINOPRIL/HCTZ OR LISINOPRIL/HCTZ OR MOEXIPRIL/HCTZ OR QUINAPRIL/HCTZ. SOOOOO can we change to one of these. sorry for the confusion Initial call taken by: Freddy Finner RN,  March 02, 2009 3:41 PM  Follow-up for Phone Call        She has been on avalide since last summer. Why is this coming up now?  Milta Deiters MD  March 04, 2009 10:05 AM   Additional Follow-up for Phone Call Additional follow up Details #1::        because medicaid is being reformed and being more strict about what they pay for, we will be getting alot of these from what i understand Additional Follow-up by: Freddy Finner RN,  March 04, 2009 11:21 AM    Additional Follow-up for Phone Call Additional follow up Details #2::    called to pharmacy Follow-up by: Freddy Finner RN,  March 04, 2009 4:42 PM  New/Updated Medications: LISINOPRIL-HYDROCHLOROTHIAZIDE 20-12.5 MG TABS (LISINOPRIL-HYDROCHLOROTHIAZIDE) Take 1 tablet by mouth once a day Prescriptions: LISINOPRIL-HYDROCHLOROTHIAZIDE 20-12.5 MG TABS (LISINOPRIL-HYDROCHLOROTHIAZIDE) Take 1 tablet by mouth once a day  #30 x 4   Entered and Authorized by:   Milta Deiters MD   Signed by:   Milta Deiters MD on 03/04/2009   Method used:   Telephoned to ...         [id number]

## 2010-05-24 NOTE — Miscellaneous (Signed)
Summary: HIPAA Restrictions  HIPAA Restrictions   Imported By: Bonner Puna 10/07/2008 15:37:52  _____________________________________________________________________  External Attachment:    Type:   Image     Comment:   External Document

## 2010-05-24 NOTE — Progress Notes (Signed)
Summary: Refills/gh  Phone Note Refill Request  on October 01, 2009 3:44 PM  Refills Requested: Medication #1:  Prodigy meter  Medication #2:  Prodigy Test Strips  Medication #3:  Prodigy Lancets  Medication #4:  Pen needles Test 3 times a day.  Meter has been damaged.   Method Requested: Electronic Initial call taken by: Sander Nephew RN,  October 01, 2009 3:46 PM  Follow-up for Phone Call       Follow-up by: Neta Mends MD,  October 01, 2009 4:23 PM    New/Updated Medications: PRODIGY BLOOD GLUCOSE TEST  STRP (GLUCOSE BLOOD) use as directed PRODIGY INSULIN SYRINGE 28G X 1/2" 1 ML MISC (INSULIN SYRINGE-NEEDLE U-100) use as directed PRODIGY LANCETS 28G  MISC (LANCETS) use as directed BLOOD GLUCOSE METER  KIT (BLOOD GLUCOSE MONITORING SUPPL) use as directed Prescriptions: PRODIGY BLOOD GLUCOSE TEST  STRP (GLUCOSE BLOOD) use as directed  #100 x 6   Entered and Authorized by:   Neta Mends MD   Signed by:   Neta Mends MD on 10/01/2009   Method used:   Faxed to ...       [facility] (mail-order)       [address] [place], [place]  [address]       Ph: [phone]       Fax: [phone]   RxID:   [id number] [place] X 1/2" 1 ML MISC (INSULIN SYRINGE-NEEDLE U-100) use as directed  #100 x 6   Entered and Authorized by:   Neta Mends MD   Signed by:   Neta Mends MD on 10/01/2009   Method used:   Faxed to ...       [facility] (mail-order)       [address] [place], [place]  [address]       Ph: [phone]       Fax: [phone]   RxID:   [id number] PRODIGY LANCETS 28G  MISC (LANCETS) use as directed  #100 x 6   Entered and Authorized by:   Neta Mends MD   Signed by:   Neta Mends MD on 10/01/2009   Method used:   Faxed to ...       [facility] (mail-order)       [address] [place], [place]  [address]       Ph: [phone]       Fax: [phone]   RxID:    [id number] BLOOD GLUCOSE METER  KIT (BLOOD GLUCOSE MONITORING SUPPL) use as directed  #1 x 0   Entered and Authorized by:   Neta Mends MD   Signed by:   Neta Mends MD on 10/01/2009   Method used:   Faxed to ...       [facility] (mail-order)       [address] [place], [place]  [address]       Ph: [phone]       Fax: [phone]   [place]:   [phone]

## 2010-05-24 NOTE — Progress Notes (Signed)
  Phone Note Outgoing Call   Call placed by: Lucky Rathke NT II,  October 28, 2009 4:19 PM Call placed to: Patient Details for Reason: REFERRALS X 2 Summary of Call: [place], GAVE HER THE 2 APPT THE DOCTOR [facility] - AUGUST 11, 011 @ 7:30AM AND/ [facility]TH[facility] -[facility]- JULY 1`1, 011 @ [phone].Lela Sturdivant NT II  October 28, 2009 4:22 PM

## 2010-05-24 NOTE — Progress Notes (Signed)
Summary: Refill/gh  Phone Note Refill Request Message from:  Pharmacy on December 24, 2009 10:46 AM  Refills Requested: Medication #1:  SIMVASTATIN 40 MG TABS Pt was started on Norvasc after discharge.  Pharmacy says that the guidelines for Simvastatin say no more than 20 daily.  What should the pharmacy dispense.   Method Requested: Electronic Initial call taken by: Sander Nephew RN,  December 24, 2009 10:49 AM  Follow-up for Phone Call        40mg  is fine, 80mg  is no longer allowed.  Follow-up by: Vertell Limber MD,  December 27, 2009 12:52 PM    Prescriptions: AMLODIPINE BESYLATE 5 MG TABS (AMLODIPINE BESYLATE) Take 1 tablet by mouth once a day  #30 x 3   Entered and Authorized by:   Vertell Limber MD   Signed by:   Vertell Limber MD on 12/27/2009   Method used:   Faxed to ...       [facility] (mail-order)       [address] [place], [place]  [address]       Ph: [phone]       Fax: [phone]   RxID:   [id number] SIMVASTATIN 40 MG TABS (SIMVASTATIN)   #30 x 1   Entered and Authorized by:   Vertell Limber MD   Signed by:   Vertell Limber MD on 12/27/2009   Method used:   Faxed to ...       [facility] (mail-order)       [address] [place], [place]  [address]       Ph: [phone]       Fax: [phone]   RxID:   [id number]

## 2010-05-24 NOTE — Progress Notes (Signed)
Summary: jphone/gg  Phone Note Call from Patient   Summary of Call: Pt call With c/o chest pain,  states when she is going up and down stairs it feels like something is sitting on chest.  also has SOB and nausea.  Pt told to go to ED for evaluation now. Initial call taken by: Gevena Cotton RN,  January 25, 2009 1:03 PM

## 2010-05-24 NOTE — Progress Notes (Signed)
Summary: Refill/gh  Phone Note Refill Request Message from:  Pharmacy on March 14, 2010 10:57 AM  Refills Requested: Medication #1:  LOPRESSOR 50 MG TABS Take 1 tablet by mouth twice a day  Medication #2:  LISINOPRIL-HYDROCHLOROTHIAZIDE 20-12.5 MG TABS Take 2 tablet by mouth once a day Pharmacy would like 6 month refills if possible.   Method Requested: Fax to [facility] Initial call taken by: Sander Nephew RN,  March 14, 2010 10:59 AM  Follow-up for Phone Call       Follow-up by: Vertell Limber MD,  March 14, 2010 12:51 PM    Prescriptions: LOPRESSOR 50 MG TABS (METOPROLOL TARTRATE) Take 1 tablet by mouth twice a day  #62 x 6   Entered and Authorized by:   Vertell Limber MD   Signed by:   Vertell Limber MD on 03/14/2010   Method used:   Faxed to ...       [facility] (mail-order)       [address] [place], [place]  [address]       Ph: [phone]       Fax: [phone]   RxID:   [id number] LISINOPRIL-HYDROCHLOROTHIAZIDE 20-12.5 MG TABS (LISINOPRIL-HYDROCHLOROTHIAZIDE) Take 2 tablet by mouth once a day  #62 x 6   Entered and Authorized by:   Vertell Limber MD   Signed by:   Vertell Limber MD on 03/14/2010   Method used:   Faxed to ...       [facility] (mail-order)       [address] [place], [place]  [address]       Ph: [phone]       Fax: [phone]   [place]:   [phone]

## 2010-05-24 NOTE — Assessment & Plan Note (Signed)
Summary: EST-1 MONTH F/U VISIT BP CHECK AND DM/CH   Vital Signs:  Patient profile:   60 year old female Height:      63 inches (160.02 cm) Weight:      262.2 pounds (119.18 kg) BMI:     46.61 Temp:     97.6 degrees F (36.44 degrees C) oral Pulse rate:   70 / minute BP sitting:   174 / 87  (right arm)  Vitals Entered By: Hilda Blades Ditzler RN (December 20, 2009 1:45 PM) CC: hypoglycemia Is Patient Diabetic? Yes Did you bring your meter with you today? No Pain Assessment Patient in pain? yes     Location: right knee Intensity: 7 Type: sharp Onset of pain  long time Nutritional Status BMI of > 30 = obese Nutritional Status Detail appetite good CBG Result 144  Have you ever been in a relationship where you felt threatened, hurt or afraid?denies   Does patient need assistance? Functional Status Self care Ambulation Normal Comments FU - discuss last labs. CBG down x 2 77 and 30 8/24 and 8/25.   CC:  hypoglycemia.  History of Present Illness: 60 y/o female with pmh as described on the EMR;who comes to the clinic for followup of her DM and BP and to have labs reviewed.  DM: pt has had low sugars last week, no meter and no meds with her today.   Patient is having anemia of chronic disease most likely due to renal failure.  She is not having nay complaints; is doing ok and feeling well.  BP continue to be elevated; will adress the issue today.  Patient is feeling well and denies CP, abdominal pain, nausea, vomiting, HA's, palpitations, blurred vision. fever, chills, diarrhea, constipation or SOB.     Depression History:      The patient denies a depressed mood most of the day and a diminished interest in her usual daily activities.         Preventive Screening-Counseling & Management  Alcohol-Tobacco     Alcohol drinks/day: 0     Smoking Status: never  Caffeine-Diet-Exercise     Does Patient Exercise: yes     Type of exercise: walking upstairs     Times/week:  7  Current Medications (verified): 1)  Aspirin 81 Mg Tbec (Aspirin) .... Take 1 Tablet By Mouth Once A Day 2)  Claritin 10 Mg Caps (Loratadine) .... Take 1 Tablet By Mouth Once A Day 3)  Plavix 75 Mg Tabs (Clopidogrel Bisulfate) .... Take 1 Tablet By Mouth Once A Day 4)  Protonix 20 Mg Tbec (Pantoprazole Sodium) .... Take 1 Tablet By Mouth Once A Day 5)  Lantus Solostar 100 Unit/ml Soln (Insulin Glargine) .... Inject 55 Units At Bedtime 6)  Ventolin Hfa 108 (90 Base) Mcg/act Aers (Albuterol Sulfate) .... Take 1-2 Puffs Every Four Hours As Needed For Shortness of Breath. 7)  Senokot S 8.6-50 Mg Tabs (Sennosides-Docusate Sodium) .... Take One Tab Once A Day As Needed For Constipation 8)  Lisinopril-Hydrochlorothiazide 20-12.5 Mg Tabs (Lisinopril-Hydrochlorothiazide) .... Take 2 Tablet By Mouth Once A Day 9)  Lopressor 50 Mg Tabs (Metoprolol Tartrate) .... Take 1 Tablet By Mouth Twice A Day 10)  Isosorbide Dinitrate 30 Mg Tabs (Isosorbide Dinitrate) .Marland Kitchen.. 1 Tab Daily 11)  Nitrostat 0.4 Mg Subl (Nitroglycerin) .... Dissolve Under Tongue Every 5 Minutes As Needed For Chest Pain Up To 3 Doses 12)  Simvastatin 40 Mg Tabs (Simvastatin) 13)  Prodigy Blood Glucose Test  Strp (Glucose Blood) .Marland KitchenMarland KitchenMarland Kitchen  Use As Directed 14)  Prodigy Insulin Syringe 28g X 1/2" 1 Ml Misc (Insulin Syringe-Needle U-100) .... Use As Directed 15)  Prodigy Lancets 28g  Misc (Lancets) .... Use As Directed 16)  Blood Glucose Meter  Kit (Blood Glucose Monitoring Suppl) .... Use As Directed 17)  Niaspan 500 Mg Cr-Tabs (Niacin (Antihyperlipidemic)) .... Take 2 Tablet By Mouth Once A Day 18)  Amlodipine Besylate 5 Mg Tabs (Amlodipine Besylate) .... Take 1 Tablet By Mouth Once A Day  Allergies: 1)  ! Codeine 2)  ! Flagyl  Review of Systems       As per HPI  Physical Exam  General:  obese, alert, cooperative, NAD Lungs:  normal respiratory effort, no accessory muscle use, normal breath sounds, no crackles, and no wheezes.   Heart:   normal rate, regular rhythm, no gallop, no rub, and no JVD.  grade 1/6 systolic murmur over outflow track. Abdomen:  soft, non-tender, and normal bowel sounds.   Msk:  s/p L TKR Extremities:  no edema Neurologic:  alert & oriented X3, strength normal in all extremities, and gait normal.     Impression & Recommendations:  Problem # 1:  DIABETES MELLITUS, INSULIN DEPENDENT (IDDM) (ICD-250.01) Assessment Comment Only had 1 episode of low CBG in 30's last week. no meter today.  Given history of CKD will d/c glimepiride, and cont. to monitor.  will bring back in one month for a1c check.  The following medications were removed from the medication list:    Glimepiride 4 Mg Tabs (Glimepiride) .Marland Kitchen... Take 1 tablet by mouth once a day Her updated medication list for this problem includes:    Aspirin 81 Mg Tbec (Aspirin) .Marland Kitchen... Take 1 tablet by mouth once a day    Lantus Solostar 100 Unit/ml Soln (Insulin glargine) ..... Inject 55 units at bedtime    Lisinopril-hydrochlorothiazide 20-12.5 Mg Tabs (Lisinopril-hydrochlorothiazide) .Marland Kitchen... Take 2 tablet by mouth once a day  Problem # 2:  HYPERTENSION (ICD-401.1) Assessment: Comment Only BP elevated, will add amlodipine, and titrate up on next visit as needed.  Also will titrate up on lopressor as pt is not B-blocked adequately now.   Her updated medication list for this problem includes:    Lisinopril-hydrochlorothiazide 20-12.5 Mg Tabs (Lisinopril-hydrochlorothiazide) .Marland Kitchen... Take 2 tablet by mouth once a day    Lopressor 50 Mg Tabs (Metoprolol tartrate) .Marland Kitchen... Take 1 tablet by mouth twice a day    Amlodipine Besylate 5 Mg Tabs (Amlodipine besylate) .Marland Kitchen... Take 1 tablet by mouth once a day  Problem # 3:  DYSLIPIDEMIA (ICD-272.4) Assessment: Comment Only Well controlled on current treatment, No new changes made today, Will continue to monitor.  Her updated medication list for this problem includes:    Simvastatin 40 Mg Tabs (Simvastatin)    Niaspan  500 Mg Cr-tabs (Niacin (antihyperlipidemic)) .Marland Kitchen... Take 2 tablet by mouth once a day  Labs Reviewed: SGOT: 13 (11/19/2009)   SGPT: 8 (11/19/2009)   HDL:37 (10/21/2009), 32 (02/16/2009)  LDL:57 (10/21/2009), 59 (02/16/2009)  Chol:114 (10/21/2009), 114 (02/16/2009)  Trig:101 (10/21/2009), 114 (02/16/2009)  Complete Medication List: 1)  Aspirin 81 Mg Tbec (Aspirin) .... Take 1 tablet by mouth once a day 2)  Claritin 10 Mg Caps (Loratadine) .... Take 1 tablet by mouth once a day 3)  Plavix 75 Mg Tabs (Clopidogrel bisulfate) .... Take 1 tablet by mouth once a day 4)  Protonix 20 Mg Tbec (Pantoprazole sodium) .... Take 1 tablet by mouth once a day 5)  Lantus Solostar 100 Unit/ml  Soln (Insulin glargine) .... Inject 55 units at bedtime 6)  Ventolin Hfa 108 (90 Base) Mcg/act Aers (Albuterol sulfate) .... Take 1-2 puffs every four hours as needed for shortness of breath. 7)  Senokot S 8.6-50 Mg Tabs (Sennosides-docusate sodium) .... Take one tab once a day as needed for constipation 8)  Lisinopril-hydrochlorothiazide 20-12.5 Mg Tabs (Lisinopril-hydrochlorothiazide) .... Take 2 tablet by mouth once a day 9)  Lopressor 50 Mg Tabs (Metoprolol tartrate) .... Take 1 tablet by mouth twice a day 10)  Isosorbide Dinitrate 30 Mg Tabs (Isosorbide dinitrate) .Marland Kitchen.. 1 tab daily 11)  Nitrostat 0.4 Mg Subl (Nitroglycerin) .... Dissolve under tongue every 5 minutes as needed for chest pain up to 3 doses 12)  Simvastatin 40 Mg Tabs (Simvastatin) 13)  Prodigy Blood Glucose Test Strp (Glucose blood) .... Use as directed 14)  Prodigy Insulin Syringe 28g X 1/2" 1 Ml Misc (Insulin syringe-needle u-100) .... Use as directed 15)  Prodigy Lancets 28g Misc (Lancets) .... Use as directed 16)  Blood Glucose Meter Kit (Blood glucose monitoring suppl) .... Use as directed 17)  Niaspan 500 Mg Cr-tabs (Niacin (antihyperlipidemic)) .... Take 2 tablet by mouth once a day 18)  Amlodipine Besylate 5 Mg Tabs (Amlodipine besylate) .... Take  1 tablet by mouth once a day  Other Orders: Capillary Blood Glucose/CBG [id number]) Influenza Vaccine NON MCR [id number])  Patient Instructions: 1)  Please schedule a follow-up appointment in 1 month. Prescriptions: AMLODIPINE BESYLATE 5 MG TABS (AMLODIPINE BESYLATE) Take 1 tablet by mouth once a day  #30 x 3   Entered and Authorized by:   Vertell Limber MD   Signed by:   Vertell Limber MD on 12/20/2009   Method used:   Faxed to ...       [facility] (mail-order)       [address] [place], [place]  [address]       Ph: [phone]       Fax: [phone]   RxID:   [id number]    Prevention & Chronic Care Immunizations   Influenza vaccine: Fluvax Non-MCR  (12/20/2009)   Influenza vaccine deferral: Not available  (10/20/2009)    Tetanus booster: Not documented   Td booster deferral: Deferred  (10/20/2009)    Pneumococcal vaccine: Not documented    H. zoster vaccine: Not documented   H. zoster vaccine deferral: Not available  (10/20/2009)  Colorectal Screening   Hemoccult: Not documented    Colonoscopy: DONE  (12/02/2009)   Colonoscopy action/deferral: GI referral  (10/20/2009)   Colonoscopy due: 11/2014  Other Screening   Pap smear: Not documented   Pap smear action/deferral: Not indicated S/P hysterectomy  (10/20/2009)    Mammogram: BI-RADS CATEGORY 3:  Probably benign finding(s) - short interval^MM DIGITAL DIAGNOSTIC UNILAT R  (10/06/2009)   Mammogram action/deferral: Ordered  (03/16/2009)   Mammogram due: 04/07/2010    DXA bone density scan: Not documented   Smoking status: never  (12/20/2009)  Diabetes Mellitus   HgbA1C: 8.6  (10/20/2009)   HgbA1C action/deferral: Ordered  (02/16/2009)    Eye exam: Not documented    Foot exam: yes  (10/06/2008)   High risk foot: Not documented   Foot care education: Done  (10/06/2008)    Urine microalbumin/creatinine ratio: Not documented   Urine microalbumin action/deferral: Not  indicated  Lipids   Total Cholesterol: 114  (10/21/2009)   LDL: 57  (10/21/2009)   LDL Direct: Not documented   HDL: 37  (10/21/2009)  Triglycerides: 101  (10/21/2009)    SGOT (AST): 13  (11/19/2009)   SGPT (ALT): 8  (11/19/2009)   Alkaline phosphatase: 70  (11/19/2009)   Total bilirubin: 0.2  (11/19/2009)  Hypertension   Last Blood Pressure: 174 / 87  (12/20/2009)   Serum creatinine: 1.64  (11/19/2009)   Serum potassium 4.5  (11/19/2009)  Self-Management Support :   Personal Goals (by the next clinic visit) :     Personal A1C goal: 6  (10/20/2009)     Personal blood pressure goal: 130/80  (10/20/2009)     Personal LDL goal: 100  (10/20/2009)    Patient will work on the following items until the next clinic visit to reach self-care goals:     Medications and monitoring: take my medicines every day, check my blood sugar, bring all of my medications to every visit, examine my feet every day  (12/20/2009)     Eating: drink diet soda or water instead of juice or soda, eat more vegetables, use fresh or frozen vegetables, eat foods that are low in salt, eat baked foods instead of fried foods, eat fruit for snacks and desserts, limit or avoid alcohol  (12/20/2009)     Activity: take a 30 minute walk every day  (12/20/2009)     Other: going up and down stairs for exercise (and walk) - chest pain stops her from exercising since stopped Isosorbide  (02/16/2009)    Diabetes self-management support: Written self-care plan, Education handout  (12/20/2009)   Diabetes care plan printed   Diabetes education handout printed    Hypertension self-management support: Written self-care plan, Education handout  (12/20/2009)   Hypertension self-care plan printed.   Hypertension education handout printed    Lipid self-management support: Written self-care plan, Education handout  (12/20/2009)   Lipid self-care plan printed.   Lipid education handout printed   Influenza Vaccine    Vaccine Type:  Fluvax Non-MCR    Site: left deltoid    Mfr: GlaxoSmithKline    Dose: 0.5 ml    Route: IM    Given by: Hilda Blades Ditzler RN    Exp. Date: 10/22/2010    Lot #: [id number]    VIS given: 11/15/06 version given December 20, 2009.  Flu Vaccine Consent Questions    Do you have a history of severe allergic reactions to this vaccine? no    Any prior history of allergic reactions to egg and/or gelatin? no    Do you have a sensitivity to the preservative Thimersol? no    Do you have a past history of Guillan-Barre Syndrome? no    Do you currently have an acute febrile illness? no    Have you ever had a severe reaction to latex? no    Vaccine information given and explained to patient? yes    Are you currently pregnant? no

## 2010-05-24 NOTE — Letter (Signed)
Summary: Patient Notice- Polyp Results  [facility]  [address] [place], [place] [address]   Phone: [phone]  Fax: [phone]        December 04, 2009 MRN: [id number]    [facility] [address] 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  Should you develop new or worsening symptoms of abdominal pain, bowel habit changes or bleeding from the rectum or bowels, please schedule an evaluation with either your primary care physician or with me.  Additional information/recommendations:  _x_ No further action with gastroenterology is needed at this time. Please      follow-up with your primary care physician for your other healthcare      needs.  __ Please call [phone] to schedule a return visit to review your      situation.  __ Please keep your follow-up visit as already scheduled.  __ Continue treatment plan as outlined the day of your exam.  Please call us if you are having persistent problems or have questions about your condition that have not been fully answered at this time.  Sincerely,  Ann Dragon MD  This letter has been electronically signed by your physician.  Appended Document: Patient Notice- Polyp Results letter mailed

## 2010-05-24 NOTE — Miscellaneous (Signed)
Summary: change in simvastatin  In light of new FDA recommendations regarding simvastatin 80 mg, I will call Ms. Dykes and tell her to decrease her Simvastatin to 40 mg for the time being. I will submit a refill for this.  Edit: Upon further review (and after already submitting the refill request), she has an appointment with Dr. Philbert Riser on June 29. I don't think simvastatin 40 mg will be a sufficient dose for her, so I will ask Dr. Philbert Riser to please decide on a more appropriate medication and she'll probably just continue with the simvastatin 80 mg until then. Please note that she was previously on Crestor 40 mg but her insurance did not cover it, prompting the switch to simvastatin 80 mg. The only statins covered by her insurance are lovastatin, pravastatin, and simvastatin, and again, I don't think pravastatin will be sufficient for her. I will double-check with [facility] attending to see if this is okay or if they would prefer to proceed in a different way.  Prescriptions: Prescriptions: SIMVASTATIN 40 MG TABS (SIMVASTATIN)   #30 x 1   Entered and Authorized by:   Neta Mends MD   Signed by:   Neta Mends MD on 10/09/2009   Method used:   Faxed to ...       [facility] (mail-order)       [address] [place], [place]  [address]       Ph: [phone]       Fax: [phone]   RxID:   [id number]   Appended Document: change in simvastatin Dr Santa Abdelrahman's plan appropriate.  LDL was 59 10/10 while on crestor.  Now on simvastatin due to insurance coverage.  No FLP since med change.  Will need FLP at next visit.

## 2010-05-24 NOTE — Procedures (Signed)
Summary: Colonoscopy  Patient: [name] Note: All result statuses are Final unless otherwise noted.  Tests: (1) Colonoscopy (COL)   COL Colonoscopy           [facility]     [place], [place]  [address]           COLONOSCOPY PROCEDURE REPORT           PATIENT:  Ann, [name]  MR#:  [id number]     BIRTHDATE:  07/29/1950, 59 yrs. old  GENDER:  female     ENDOSCOPIST:  Lowella Bandy. Olevia Perches, MD     REF. BY:  Alphia Moh, M.D.     PROCEDURE DATE:  12/02/2009     PROCEDURE:  Colonoscopy [id number]     ASA CLASS:  Class II     INDICATIONS:  Routine Risk Screening     MEDICATIONS:   Versed 7 mg, Fentanyl 50 mcg           DESCRIPTION OF PROCEDURE:   After the risks benefits and     alternatives of the procedure were thoroughly explained, informed     consent was obtained.  Digital rectal exam was performed and     revealed no rectal masses.   The LB [id number] endoscope     was introduced through the anus and advanced to the cecum, which     was identified by both the appendix and ileocecal valve, without     limitations.  The quality of the prep was good, using MiraLax.     The instrument was then slowly withdrawn as the colon was fully     examined.     <<PROCEDUREIMAGES>>           FINDINGS:  A pedunculated polyp was found in the ascending colon.     10 mm polyp at 120 cm Polyp was snared, then cauterized with     monopolar cautery. Retrieval was successful (see image1, image2,     and image3). snare polyp  This was otherwise a normal examination     of the colon (see image4, image5, and image6).  Internal     hemorrhoids were found (see image6).  Mild diverticulosis was     found (see image3).   Retroflexed views in the rectum revealed no     abnormalities.    The scope was then withdrawn from the patient     and the procedure completed.           COMPLICATIONS:  None     ENDOSCOPIC IMPRESSION:     1) Pedunculated polyp in the ascending  colon     2) Otherwise normal examination     3) Internal hemorrhoids     4) Mild diverticulosis     RECOMMENDATIONS:     1) Await pathology results     2) high fiber diet     REPEAT EXAM:  In 5 year(s) for.           ______________________________     Lowella Bandy. Olevia Perches, MD           CC:           n.     eSIGNED:   Lowella Bandy. Matai Carpenito at 12/02/2009 08:39 AM           Lacks, [name], [id number]  Note: An exclamation mark (!) indicates a result that was not dispersed into the flowsheet.  Document Creation Date: 12/02/2009 8:40 AM _______________________________________________________________________  (1) Order result status: Final Collection or observation date-time: 12/02/2009 08:23 Requested date-time:  Receipt date-time:  Reported date-time:  Referring Physician:   Ordering Physician: Delfin Edis [phone]) Specimen Source:  Source: Ann [name] Order Number: [id number] Lab site:   Appended Document: Colonoscopy     Procedures Next Due Date:    Colonoscopy: 11/2014

## 2010-05-24 NOTE — Progress Notes (Signed)
Summary: Medication change  Phone Note Refill Request Message from:  Fax from Pharmacy on February 17, 2009 3:31 PM  Refills Requested: Medication #1:  AVALIDE 300-25 MG TABS Take one tab by mouth once daily. Can this be changed to Diovan HCT Hyzaar.  Needs to go to [facility]. at [phone].   Method Requested: Electronic Initial call taken by: Sander Nephew RN,  February 17, 2009 3:32 PM  Follow-up for Phone Call        Please forward to patient's PCP. Follow-up by: Bertha Stakes MD,  February 17, 2009 4:04 PM  Additional Follow-up for Phone Call Additional follow up Details #1::        Done. Switched med to [employer] 100-25 and faxed to [facility].    New/Updated Medications: HYZAAR 100-25 MG TABS (LOSARTAN POTASSIUM-HCTZ) 1 tab daily Prescriptions: HYZAAR 100-25 MG TABS (LOSARTAN POTASSIUM-HCTZ) 1 tab daily  #30 x 3   Entered by:   Neta Mends MD   Authorized by:   Marland Kitchen OPC ATTENDING DESKTOP   Signed by:   Neta Mends MD on 02/22/2009   Method used:   Faxed to ...       [facility] (mail-order)       [address] [place], [place]  [address]       Ph: [phone]       Fax: [phone]   RxID:   [id number] HYZAAR 100-25 MG TABS (LOSARTAN POTASSIUM-HCTZ) 1 tab daily  #30 x 3   Entered by:   Neta Mends MD   Authorized by:   Marland Kitchen OPC ATTENDING DESKTOP   Signed by:   Neta Mends MD on 02/22/2009   Method used:   Telephoned to ...       [facility] (mail-order)       [address] [place], [place]  [address]       Ph: [phone]       Fax: [phone]   RxID:   [id number]

## 2010-05-24 NOTE — Assessment & Plan Note (Signed)
Summary: NEW HFU-PER DR WARREN/CFB   Vital Signs:  Patient profile:   60 year old female Height:      63 inches (160.02 cm) Weight:      276.0 pounds (125.45 kg) BMI:     49.07 Temp:     97.4 degrees F (36.33 degrees C) oral Pulse rate:   78 / minute BP sitting:   176 / 77  (right arm)  Vitals Entered By: Morrison Old RN (October 06, 2008 2:56 PM) CC: Hospital f/u ; no c/o's Is Patient Diabetic? Yes  Pain Assessment Patient in pain? no      Nutritional Status BMI of > 30 = obese CBG Result 305  Have you ever been in a relationship where you felt threatened, hurt or afraid?No   Does patient need assistance? Functional Status Self care Ambulation Normal   CC:  Hospital f/u ; no c/o's.  History of Present Illness: Pt is a 60yo female with pmhx outlined below who presents today for hospital follow-up.  Pt was admitted earlier this month for syncope.  Workup, including ECHO, carotid doppler, and EEG all within normal limits.  Pt was believed to be overmedicated, in that her BP in the ED at time of admission was 68/43.  All of the pts HTN meds were held at time of discharge. Today, pt denies any acute complaints.       Preventive Screening-Counseling & Management  Alcohol-Tobacco     Alcohol drinks/day: 0     Smoking Status: never  Caffeine-Diet-Exercise     Does Patient Exercise: yes     Type of exercise: walking  Allergies (verified): 1)  ! Codeine 2)  ! Flagyl  Social History: Smoking Status:  never Does Patient Exercise:  yes  Review of Systems       See HPI  Physical Exam  General:  overweight-appearing.  overweight-appearing.   Lungs:  Normal respiratory effort, chest expands symmetrically. Lungs are clear to auscultation, no crackles or wheezes. Heart:  Normal rate and regular rhythm. S1 and S2 normal without gallop, murmur, click, rub or other extra sounds. Abdomen:  soft and normal bowel sounds.  soft and normal bowel sounds.   Extremities:  Trace  edema Neurologic:  Nonfocal   Impression & Recommendations:  Problem # 1:  HYPERTENSION (ICD-401.1) Pts episode of syncope was likely 2/2 overmedication with anti-hypertensives.  At this time, will restart her Avalide.  Plan to have the pt return in 2-3 weeks in order to check her BP and a BMET.  Her updated medication list for this problem includes:    Avalide 300-25 Mg Tabs (Irbesartan-hydrochlorothiazide) .Marland Kitchen... Take one tab by mouth once daily  BP today: 176/77  Labs Reviewed: LDL: 86 (09/28/2008)     Problem # 2:  DIABETES MELLITUS, INSULIN DEPENDENT (IDDM) (ICD-250.01) Pts A1c was 7.7 during her hospitalization.  However, today her CBG is 305.  Pt states that she has not taken her Lantus in a few days because of an inability to afford the prescription.  However, she now has an assistance program that is willing to provide the med.  She states that she will be getting the Lantus today.  No changes to her regimen planned at this time.  Pt advised to call the clinic if she is unable to secure her Lantus and we will provide her with a sample if needs be.  Will follow.    Her updated medication list for this problem includes:    Aspirin 81 Mg  Tbec (Aspirin) .Marland Kitchen... Take 1 tablet by mouth once a day    Glimepiride 4 Mg Tabs (Glimepiride) .Marland Kitchen... Take 1 tablet by mouth once a day    Lantus 100 Unit/ml Soln (Insulin glargine) .Marland Kitchen... Take 20 units at bedtime    Avalide 300-25 Mg Tabs (Irbesartan-hydrochlorothiazide) .Marland Kitchen... Take one tab by mouth once daily  Reviewed HgBA1c results: 7.7 in hospital (09/27/2008)  Complete Medication List: 1)  Aspirin 81 Mg Tbec (Aspirin) .... Take 1 tablet by mouth once a day 2)  Claritin 10 Mg Caps (Loratadine) .... Take 1 tablet by mouth once a day 3)  Glimepiride 4 Mg Tabs (Glimepiride) .... Take 1 tablet by mouth once a day 4)  Plavix 75 Mg Tabs (Clopidogrel bisulfate) .... Take 1 tablet by mouth once a day 5)  Protonix 20 Mg Tbec (Pantoprazole sodium) ....  Take 1 tablet by mouth once a day 6)  Crestor 40 Mg Tabs (Rosuvastatin calcium) 7)  Lantus 100 Unit/ml Soln (Insulin glargine) .... Take 20 units at bedtime 8)  Ventolin Hfa 108 (90 Base) Mcg/act Aers (Albuterol sulfate) .... Take 1-2 puffs every four hours as needed for shortness of breath. 9)  Senokot S 8.6-50 Mg Tabs (Sennosides-docusate sodium) .... Take one tab once a day as needed for constipation 10)  Avalide 300-25 Mg Tabs (Irbesartan-hydrochlorothiazide) .... Take one tab by mouth once daily  Other Orders: Capillary Blood Glucose/CBG [id number])  Patient Instructions: 1)  Please schedule a follow-up appointment in 1 month. Prescriptions: AVALIDE 300-25 MG TABS (IRBESARTAN-HYDROCHLOROTHIAZIDE) Take one tab by mouth once daily  #30 x 3   Entered and Authorized by:   Kathlynn Grate MD   Signed by:   Kathlynn Grate MD on 10/06/2008   Method used:   Print then Give to Patient   RxID:   [id number]          Diabetic Foot Exam Last Podiatry Exam Date: 10/06/2008  Foot Inspection Is there a history of a foot ulcer?              No Is there a foot ulcer now?              No Can the patient see the bottom of their feet?          No Are the shoes appropriate in style and fit?          Yes Is there swelling or an abnormal foot shape?          No Are the toenails long?                No Are the toenails thick?                No Are the toenails ingrown?              No Is there heavy callous build-up?              No Is there a claw toe deformity?                          No      Diabetic Foot Care Education :Patient educated on appropriate care of diabetic feet.     10-g (5.07) Semmes-Weinstein Monofilament Test Performed by: Morrison Old RN          Right Foot          Left Foot Visual Inspection  Test Control      normal         normal Site 1         normal         normal Site 2         normal         normal Site 3         normal         normal Site 4          normal         normal Site 5         normal         normal Site 6         normal         normal Site 7         normal         normal Site 8         normal         normal Site 9         normal         normal

## 2010-05-24 NOTE — Consult Note (Signed)
[facility]   Imported By: Garlan Fillers 11/02/2009 11:29:28  _____________________________________________________________________  External Attachment:    Type:   Image     Comment:   External Document

## 2010-05-24 NOTE — Progress Notes (Signed)
Summary: refill/gg  Phone Note Refill Request  on August 12, 2009 3:04 PM  Refills Requested: Medication #1:  LANTUS 100 UNIT/ML SOLN Take 20 units at bedtime  Method Requested: Telephone to Pharmacy Initial call taken by: Gevena Cotton RN,  August 12, 2009 3:04 PM  Follow-up for Phone Call        I've already taken care of this. Follow-up by: Neta Mends MD,  August 15, 2009 10:21 AM

## 2010-05-24 NOTE — Progress Notes (Signed)
Summary: REfill/gh  Phone Note Refill Request Message from:  Fax from Pharmacy on December 20, 2009 2:56 PM  Refills Requested: Medication #1:  VENTOLIN HFA 108 (90 BASE) MCG/ACT AERS Take 1-2 puffs every four hours as needed for shortness of breath.  Method Requested: Electronic Initial call taken by: Sander Nephew RN,  December 20, 2009 2:56 PM    Prescriptions: VENTOLIN HFA 108 (90 BASE) MCG/ACT AERS (ALBUTEROL SULFATE) Take 1-2 puffs every four hours as needed for shortness of breath.  #1 x 2   Entered and Authorized by:   Vertell Limber MD   Signed by:   Vertell Limber MD on 12/20/2009   Method used:   Faxed to ...       [facility] (mail-order)       [address] [place], [place]  [address]       Ph: [phone]       Fax: [phone]   RxID:   [id number] VENTOLIN HFA 108 (90 BASE) MCG/ACT AERS (ALBUTEROL SULFATE) Take 1-2 puffs every four hours as needed for shortness of breath.  #1 x 2   Entered and Authorized by:   Vertell Limber MD   Signed by:   Vertell Limber MD on 12/20/2009   Method used:   Print then Give to Patient   RxID:   [id number]

## 2010-05-24 NOTE — Progress Notes (Signed)
Summary: Refill/gh  Phone Note Refill Request Message from:  Fax from Pharmacy on February 21, 2010 9:52 AM  Refills Requested: Medication #1:  LISINOPRIL-HYDROCHLOROTHIAZIDE 20-12.5 MG TABS Take 2 tablet by mouth once a day  Medication #2:  LOPRESSOR 50 MG TABS Take 1 tablet by mouth twice a day  Medication #3:  LANTUS SOLOSTAR 100 UNIT/ML SOLN inject 55 units at bedtime  Medication #4:  PLAVIX 75 MG TABS Take 1 tablet by mouth once a day  Method Requested: Electronic Initial call taken by: Sander Nephew RN,  February 21, 2010 9:59 AM    Prescriptions: LOPRESSOR 50 MG TABS (METOPROLOL TARTRATE) Take 1 tablet by mouth twice a day  #62 x 3   Entered and Authorized by:   Vertell Limber MD   Signed by:   Vertell Limber MD on 02/21/2010   Method used:   Faxed to ...       [facility] (mail-order)       [address] [place], [place]  [address]       Ph: [phone]       Fax: [phone]   RxID:   [id number] AMLODIPINE BESYLATE 5 MG TABS (AMLODIPINE BESYLATE) Take 1 tablet by mouth once a day  #30 x 3   Entered and Authorized by:   Vertell Limber MD   Signed by:   Vertell Limber MD on 02/21/2010   Method used:   Faxed to ...       [facility] (mail-order)       [address] [place], [place]  [address]       Ph: [phone]       Fax: [phone]   RxID:   [id number] LISINOPRIL-HYDROCHLOROTHIAZIDE 20-12.5 MG TABS (LISINOPRIL-HYDROCHLOROTHIAZIDE) Take 2 tablet by mouth once a day  #62 x 3   Entered and Authorized by:   Vertell Limber MD   Signed by:   Vertell Limber MD on 02/21/2010   Method used:   Faxed to ...       [facility] (mail-order)       [address] [place], [place]  [address]       Ph: [phone]       Fax: [phone]   RxID:   [id number] LANTUS SOLOSTAR 100 UNIT/ML SOLN (INSULIN GLARGINE) inject 55 units at bedtime  #1 month x 11   Entered and Authorized by:   Vertell Limber  MD   Signed by:   Vertell Limber MD on 02/21/2010   Method used:   Faxed to ...       [facility] (mail-order)       [address] [place], [place]  [address]       Ph: [phone]       Fax: [phone]   RxID:   [id number] PLAVIX 75 MG TABS (CLOPIDOGREL BISULFATE) Take 1 tablet by mouth once a day  #30 x 3   Entered and Authorized by:   Vertell Limber MD   Signed by:   Vertell Limber MD on 02/21/2010   Method used:   Faxed to ...       [facility] (mail-order)       [address] [place], [place]  [address]       Ph: [phone]  Fax: [phone]   [id number]

## 2010-05-24 NOTE — Progress Notes (Signed)
Summary: Refill/gh  Phone Note Refill Request Message from:  Fax from Pharmacy on February 21, 2010 11:08 AM  Refills Requested: Medication #1:  PROTONIX 20 MG TBEC Take 1 tablet by mouth once a day  Medication #2:  SIMVASTATIN 40 MG TABS  Medication #3:  VENTOLIN HFA 108 (90 BASE) MCG/ACT AERS Take 1-2 puffs every four hours as needed for shortness of breath.  Method Requested: Electronic Initial call taken by: Sander Nephew RN,  February 21, 2010 11:08 AM    Prescriptions: SIMVASTATIN 40 MG TABS (SIMVASTATIN)   #30 x 3   Entered and Authorized by:   Vertell Limber MD   Signed by:   Vertell Limber MD on 02/21/2010   Method used:   Faxed to ...       [facility] (mail-order)       [address] [place], [place]  [address]       Ph: [phone]       Fax: [phone]   RxID:   [id number] VENTOLIN HFA 108 (90 BASE) MCG/ACT AERS (ALBUTEROL SULFATE) Take 1-2 puffs every four hours as needed for shortness of breath.  #1 x 3   Entered and Authorized by:   Vertell Limber MD   Signed by:   Vertell Limber MD on 02/21/2010   Method used:   Faxed to ...       [facility] (mail-order)       [address] [place], [place]  [address]       Ph: [phone]       Fax: [phone]   RxID:   [id number] PROTONIX 20 MG TBEC (PANTOPRAZOLE SODIUM) Take 1 tablet by mouth once a day  #30 x 3   Entered and Authorized by:   Vertell Limber MD   Signed by:   Vertell Limber MD on 02/21/2010   Method used:   Faxed to ...       [facility] (mail-order)       [address] [place],   [address]       Ph: [phone]       Fax: [phone]   RxID:   [id number]

## 2010-05-24 NOTE — Letter (Signed)
Summary: Anticoagulation/[facility]  Anticoagulation/[facility]   Imported By: Phillis Knack 11/09/2009 09:18:08  _____________________________________________________________________  External Attachment:    Type:   Image     Comment:   External Document

## 2010-05-24 NOTE — Progress Notes (Signed)
Summary: Medication change  Phone Note From Pharmacy   Summary of Call: Fax from pharmacy.  Hyzaar is now approved by the pt's insurance.  Do you want to go back to the Hyzaar or continue the pt on the Lisinopril/HCTZ  20/12.5 combination dose. Initial call taken by: Sander Nephew RN,  April 20, 2009 11:09 AM  Follow-up for Phone Call        Please route to patient's primary MD, and she can decide when she returns. Follow-up by: Burman Freestone MD,  April 20, 2009 11:19 AM

## 2010-05-24 NOTE — Progress Notes (Signed)
Summary: Refill/gh  Phone Note Refill Request Message from:  Fax from Pharmacy on August 10, 2009 9:47 AM  Refills Requested: Medication #1:  Lantus Solostar Pt requests the the Solostar instead of the vials.  Needs doctor approval for.   Method Requested: Electronic Initial call taken by: Sander Nephew RN,  August 10, 2009 9:48 AM  Follow-up for Phone Call       Follow-up by: Neta Mends MD,  August 12, 2009 3:07 PM    New/Updated Medications: LANTUS SOLOSTAR 100 UNIT/ML SOLN (INSULIN GLARGINE) inject 20 units at bedtime Prescriptions: LANTUS SOLOSTAR 100 UNIT/ML SOLN (INSULIN GLARGINE) inject 20 units at bedtime  #1 month x 3   Entered and Authorized by:   Neta Mends MD   Signed by:   Neta Mends MD on 08/12/2009   Method used:   Faxed to ...       [facility] (mail-order)       [address] [place], [place]  [address]       Ph: [phone]       Fax: [phone]   [place]:   [phone]

## 2010-05-24 NOTE — Letter (Signed)
Summary: [facility] Instructions  [facility]  [place], [place] [address]   Phone: [phone]  Fax: [phone]       [facility] [place]    03-Jul-1950    MRN: [id number]       Procedure Day /Date:  Thursday 12/02/2009     Arrival Time:  7:30 am     Procedure Time:  8:00 am     Location of Procedure:                    _x _  [place] (4th Floor)    [place]  Starting 5 days prior to your procedure Saturday 8/6 do not eat nuts, seeds, popcorn, corn, beans, peas,  salads, or any raw vegetables.  Do not take any fiber supplements (e.g. Metamucil, Citrucel, and Benefiber). ____________________________________________________________________________________________________   THE DAY BEFORE YOUR PROCEDURE         DATE: Wednesday 8/10  1   Drink clear liquids the entire day-NO SOLID FOOD  2   Do not drink anything colored red or purple.  Avoid juices with pulp.  No orange juice.  3   Drink at least 64 oz. (8 glasses) of fluid/clear liquids during the day to prevent dehydration and help the prep work efficiently.  CLEAR LIQUIDS INCLUDE: Water Jello Ice Popsicles Tea (sugar ok, no milk/cream) Powdered fruit flavored drinks Coffee (sugar ok, no milk/cream) Gatorade Juice: apple, white grape, white cranberry  Lemonade Clear bullion, consomm, broth Carbonated beverages (any kind) Strained chicken noodle soup Hard Candy  4   Mix the entire bottle of Miralax with 64 oz. of Gatorade/Powerade in the morning and put in the refrigerator to chill.  5   At 3:00 pm take 2 Dulcolax/Bisacodyl tablets.  6   At 4:30 pm take one Reglan/Metoclopramide tablet.  7  Starting at 5:00 pm drink one 8 oz glass of the Miralax mixture every 15-20 minutes until you have finished drinking the entire 64 oz.  You should finish drinking prep around 7:30 or 8:00 pm.  8   If you are nauseated, you may take the 2nd Reglan/Metoclopramide  tablet at 6:30 pm.        9    At 8:00 pm take 2 more DULCOLAX/Bisacodyl tablets.     THE DAY OF YOUR PROCEDURE      DATE:  Thursday 8/11  You may drink clear liquids until 6:00 am  (2 HOURS BEFORE PROCEDURE).   MEDICATION INSTRUCTIONS  Unless otherwise instructed, you should take regular prescription medications with a small sip of water as early as possible the morning of your procedure.  Diabetic patients - see separate instructions.  Stop taking Plavix or Aggrenox on  Thursday 8/4  (7 days before procedure).             Additional medication instructions:  Do not take Lisinopril/HCTZ day of procedure.         OTHER INSTRUCTIONS  You will need a responsible adult at least 61 years of age to accompany you and drive you home.   This person must remain in the waiting room during your procedure.  Wear loose fitting clothing that is easily removed.  Leave jewelry and other valuables at home.  However, you may wish to bring a book to read or an iPod/MP3 player to listen to music as you wait for your procedure to start.  Remove all body piercing jewelry and leave at home.  Total time from sign-in until discharge is approximately 2-3 hours.  You should go home directly after your procedure and rest.  You can resume normal activities the day after your procedure.  The day of your procedure you should not:   Drive   Make legal decisions   Operate machinery   Drink alcohol   Return to work  You will receive specific instructions about eating, activities and medications before you leave.   The above instructions have been reviewed and explained to me by   _______________________    I fully understand and can verbalize these instructions _____________________________ Date _______

## 2010-05-24 NOTE — Assessment & Plan Note (Signed)
Summary: ACUTE-F/U WITH MEDICIATIONS/(RIOFRIO)/CFB   Vital Signs:  Patient profile:   61 year old female Height:      63 inches (160.02 cm) Weight:      264.9 pounds (121.55 kg) BMI:     47.54 Temp:     97.5 degrees F (36.39 degrees C) oral Pulse rate:   109 / minute BP sitting:   169 / 78  (right arm) Cuff size:   large  Vitals Entered By: Lucky Rathke NT II (October 20, 2009 10:13 AM) CC: Medication follow up/ L knee pain/ back pain Is Patient Diabetic? Yes Did you bring your meter with you today? no/ meter broken Pain Assessment Patient in pain? yes     Location: left knee/back Intensity: 7 Type: aching Onset of pain  Constant Nutritional Status BMI of > 30 = obese  Have you ever been in a relationship where you felt threatened, hurt or afraid?No   Does patient need assistance? Functional Status Self care Ambulation Normal   CC:  Medication follow up/ L knee pain/ back pain.  History of Present Illness: Ann [name] is a 60 yo woman with PMH as outlined in chart.  She is here for routine follow up.  Of note, she has a few complaints.    1.  She has had L TKR at [place], [place].  She is having some left knee pain and "clicking".  States she has not established with an orthopedic doctor in [place].  2.  Lower back pain.  This is located over midline back.  This has been ongoing for years.  States she was told she had a slipped disc and that orthopedic above stated he wanted to perform an injection but she was not interested.   3.  Myalgia.    reports that she has had "aches" in her thighs for about 3-4 years.  Getting worse over time.    Preventive Screening-Counseling & Management  Alcohol-Tobacco     Alcohol drinks/day: 0     Smoking Status: never  Caffeine-Diet-Exercise     Does Patient Exercise: yes     Type of exercise: walking upstairs     Times/week: 7  Current Medications (verified): 1)  Aspirin 81 Mg Tbec (Aspirin) .... Take 1 Tablet By Mouth Once A  Day 2)  Claritin 10 Mg Caps (Loratadine) .... Take 1 Tablet By Mouth Once A Day 3)  Glimepiride 4 Mg Tabs (Glimepiride) .... Take 1 Tablet By Mouth Once A Day 4)  Plavix 75 Mg Tabs (Clopidogrel Bisulfate) .... Take 1 Tablet By Mouth Once A Day 5)  Protonix 20 Mg Tbec (Pantoprazole Sodium) .... Take 1 Tablet By Mouth Once A Day 6)  Lantus Solostar 100 Unit/ml Soln (Insulin Glargine) .... Inject 20 Units At Bedtime 7)  Ventolin Hfa 108 (90 Base) Mcg/act Aers (Albuterol Sulfate) .... Take 1-2 Puffs Every Four Hours As Needed For Shortness of Breath. 8)  Senokot S 8.6-50 Mg Tabs (Sennosides-Docusate Sodium) .... Take One Tab Once A Day As Needed For Constipation 9)  Lisinopril-Hydrochlorothiazide 20-12.5 Mg Tabs (Lisinopril-Hydrochlorothiazide) .... Take 1 Tablet By Mouth Once A Day 10)  Lopressor 50 Mg Tabs (Metoprolol Tartrate) .... 1/2 Tablet Twice Daily 11)  Isosorbide Dinitrate 30 Mg Tabs (Isosorbide Dinitrate) .Marland Kitchen.. 1 Tab Daily 12)  Nitrostat 0.4 Mg Subl (Nitroglycerin) .... Dissolve Under Tongue Every 5 Minutes As Needed For Chest Pain Up To 3 Doses 13)  Simvastatin 40 Mg Tabs (Simvastatin) 14)  Prodigy Blood Glucose Test  Strp (Glucose Blood) .Marland KitchenMarland KitchenMarland Kitchen  Use As Directed 15)  Prodigy Insulin Syringe 28g X 1/2" 1 Ml Misc (Insulin Syringe-Needle U-100) .... Use As Directed 16)  Prodigy Lancets 28g  Misc (Lancets) .... Use As Directed 17)  Blood Glucose Meter  Kit (Blood Glucose Monitoring Suppl) .... Use As Directed 18)  Niaspan 500 Mg Cr-Tabs (Niacin (Antihyperlipidemic)) .... Take 2 Tablet By Mouth Once A Day  Allergies (verified): 1)  ! Codeine 2)  ! Flagyl  Past History:  Risk Factors: Alcohol Use: 0 (10/20/2009) Exercise: yes (10/20/2009)  Risk Factors: Smoking Status: never (10/20/2009)  Past Medical History: CAD, followed by Dr. Gwenlyn Found HTN HLD DM CKD, stage III Anemia, NOS OSA using CPAP OA  Past Surgical History: s/p L TKR  Review of Systems      See HPI  Physical  Exam  General:  obese, alert, cooperative, NAD Eyes:  anicteric Neck:  supple, no JVD, and no carotid bruits.   Lungs:  normal respiratory effort, no accessory muscle use, normal breath sounds, no crackles, and no wheezes.   Heart:  normal rate, regular rhythm, no gallop, no rub, and no JVD.  grade 1/6 systolic murmur over outflow track. Abdomen:  soft, non-tender, and normal bowel sounds.   Msk:  s/p L TKR Extremities:  no edema Neurologic:  alert & oriented X3, strength normal in all extremities, and gait normal.   Psych:  Oriented X3, memory intact for recent and remote, normally interactive, and good eye contact.     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 Take 1 tablet by mouth once a day  Orders: T-Comprehensive Metabolic Panel ([id number]) T-CK Total [id number])  Problem # 3:  KNEE JOINT REPLACEMENT BY OTHER MEANS (ICD-V43.65)  will refer to orthopedic to establish care with them  Orders: Orthopedic Referral (Ortho)  Problem # 4:  DIABETES MELLITUS, INSULIN DEPENDENT (IDDM) (ICD-250.01) not at goal and unchanged from prior will have her increase lantus by 1 unit per day until fasting CBG < 150  Her updated medication list for this problem includes:    Aspirin 81 Mg Tbec (Aspirin) .Marland Kitchen... Take 1 tablet by mouth once a day    Glimepiride 4 Mg Tabs (Glimepiride) .Marland Kitchen... Take 1 tablet by mouth once a day    Lantus Solostar 100 Unit/ml Soln (Insulin glargine) ..... Inject 20 units at bedtime    Lisinopril-hydrochlorothiazide 20-12.5 Mg  Tabs (Lisinopril-hydrochlorothiazide) .Marland Kitchen... Take 1 tablet by mouth once a day  Orders: T-Hgb A1C (in-house) [phone]) T- Capillary Blood Glucose [id number]) T-Lipid Profile [id number])  Labs Reviewed: Creat: 1.60 (02/16/2009)    Reviewed HgBA1c results: 8.6 (10/20/2009)  8.7 (02/16/2009)  Problem # 5:  HYPERTENSION (ICD-401.1)  will increase metoprolol to 50mg  two times a day would increase lisinopril/HCTZ to 2 tablets next visit if not at goal  Her updated medication list for this problem includes:    Lisinopril-hydrochlorothiazide 20-12.5 Mg Tabs (Lisinopril-hydrochlorothiazide) .Marland Kitchen... Take 1 tablet by mouth once a day    Lopressor 50 Mg Tabs (Metoprolol tartrate) .Marland Kitchen... Take 1 tablet by mouth once a day  BP today: 169/78 Prior BP:  143/78 (03/16/2009)  Labs Reviewed: K+: 4.1 (02/16/2009) Creat: : 1.60 (02/16/2009)   Chol: 114 (02/16/2009)   HDL: 32 (02/16/2009)   LDL: 59 (02/16/2009)   TG: 114 (02/16/2009)  Orders: T-Comprehensive Metabolic Panel ([id number])  Problem # 6:  CHRONIC KIDNEY DISEASE STAGE III (MODERATE) (ICD-585.3)  per notes, Cr baseline about 1.6 will recheck today along with PO4 may need iPTH checked recently  Labs Reviewed: BUN: 17 (02/16/2009)   Cr: 1.60 (02/16/2009)    Ca++: 8.8 (02/16/2009)     Orders: T-Comprehensive Metabolic Panel ([id number])  Complete Medication List: 1)  Aspirin 81 Mg Tbec (Aspirin) .... Take 1 tablet by mouth once a day 2)  Claritin 10 Mg Caps (Loratadine) .... Take 1 tablet by mouth once a day 3)  Glimepiride 4 Mg Tabs (Glimepiride) .... Take 1 tablet by mouth once a day 4)  Plavix 75 Mg Tabs (Clopidogrel bisulfate) .... Take 1 tablet by mouth once a day 5)  Protonix 20 Mg Tbec (Pantoprazole sodium) .... Take 1 tablet by mouth once a day 6)  Lantus Solostar 100 Unit/ml Soln (Insulin glargine) .... Inject 20 units at bedtime 7)  Ventolin Hfa 108 (90 Base) Mcg/act Aers (Albuterol sulfate) .... Take 1-2 puffs every four  hours as needed for shortness of breath. 8)  Senokot S 8.6-50 Mg Tabs (Sennosides-docusate sodium) .... Take one tab once a day as needed for constipation 9)  Lisinopril-hydrochlorothiazide 20-12.5 Mg Tabs (Lisinopril-hydrochlorothiazide) .... Take 1 tablet by mouth once a day 10)  Lopressor 50 Mg Tabs (Metoprolol tartrate) .... Take 1 tablet by mouth once a day 11)  Isosorbide Dinitrate 30 Mg Tabs (Isosorbide dinitrate) .Marland Kitchen.. 1 tab daily 12)  Nitrostat 0.4 Mg Subl (Nitroglycerin) .... Dissolve under tongue every 5 minutes as needed for chest pain up to 3 doses 13)  Simvastatin 40 Mg Tabs (Simvastatin) 14)  Prodigy Blood Glucose Test Strp (Glucose blood) .... Use as directed 15)  Prodigy Insulin Syringe 28g X 1/2" 1 Ml Misc (Insulin syringe-needle u-100) .... Use as directed 16)  Prodigy Lancets 28g Misc (Lancets) .... Use as directed 17)  Blood Glucose Meter Kit (Blood glucose monitoring suppl) .... Use as directed 18)  Niaspan 500 Mg Cr-tabs (Niacin (antihyperlipidemic)) .... Take 2 tablet by mouth once a day  Other Orders: T-CBC No Diff [id number])  Patient Instructions: 1)  Please schedule a follow-up appointment in 1 month, please schedule with PCP. 2)  Will check some labs today and call if there is any problem. 3)  Increase metoprolol as listed below. 4)  Continue with simvastatin (zocor) 40mg  daily 5)  Will refer to orthopedic as discussed 6)  Will refer to GI for colonoscopy as discussed.  7)  Check your sugar every morning before breakfast.  If your sugar is > 150, then increase your lantus by 1 unit everyday until your sugar is less than 150 and stay at that dose.  Process Orders Check Orders Results:     [facility]: [id number] not required for this insurance Tests Sent for requisitioning (October 20, 2009 11:15 AM):     10/20/2009: [facility] -- T-Comprehensive Metabolic Panel [id number] (signed)     10/20/2009: [facility] --  T-Lipid Profile [phone] (signed)     10/20/2009: [facility] -- T-CBC No Diff [id number] (signed)     10/20/2009: [facility] -- T-CK Total [[id number]] (signed)    Prevention & Chronic Care Immunizations   Influenza vaccine: Fluvax 3+  (03/16/2009)   Influenza vaccine  deferral: Not available  (10/20/2009)    Tetanus booster: Not documented   Td booster deferral: Deferred  (10/20/2009)    Pneumococcal vaccine: Not documented    H. zoster vaccine: Not documented   H. zoster vaccine deferral: Not available  (10/20/2009)    Immunization comments: pt reports having tetanus shot in [place] "not that long ago".... need to obtain records  Colorectal Screening   Hemoccult: Not documented    Colonoscopy: Not documented   Colonoscopy action/deferral: GI referral  (10/20/2009)  Other Screening   Pap smear: Not documented   Pap smear action/deferral: Not indicated S/P hysterectomy  (10/20/2009)    Mammogram: BI-RADS CATEGORY 3:  Probably benign finding(s) - short interval^MM DIGITAL DIAGNOSTIC UNILAT R  (10/06/2009)   Mammogram action/deferral: Ordered  (03/16/2009)   Mammogram due: 04/07/2010    DXA bone density scan: Not documented  Reports requested:  Smoking status: never  (10/20/2009)  Diabetes Mellitus   HgbA1C: 8.6  (10/20/2009)   HgbA1C action/deferral: Ordered  (02/16/2009)    Eye exam: Not documented   Last eye exam report requested.    Foot exam: yes  (10/06/2008)   High risk foot: Not documented   Foot care education: Done  (10/06/2008)    Urine microalbumin/creatinine ratio: Not documented   Urine microalbumin action/deferral: Not indicated    Diabetes flowsheet reviewed?: Yes   Progress toward A1C goal: Unchanged  Lipids   Total Cholesterol: 114  (02/16/2009)   LDL: 59  (02/16/2009)   LDL Direct: Not documented   HDL: 32  (02/16/2009)   Triglycerides: 114  (02/16/2009)  Hypertension   Last Blood  Pressure: 169 / 78  (10/20/2009)   Serum creatinine: 1.60  (02/16/2009)   Serum potassium 4.1  (02/16/2009) CMP ordered     Hypertension flowsheet reviewed?: Yes   Progress toward BP goal: Deteriorated  Self-Management Support :   Personal Goals (by the next clinic visit) :     Personal A1C goal: 6  (10/20/2009)     Personal blood pressure goal: 130/80  (10/20/2009)     Personal LDL goal: 100  (10/20/2009)    Patient will work on the following items until the next clinic visit to reach self-care goals:     Medications and monitoring: take my medicines every day, check my blood sugar, bring all of my medications to every visit, examine my feet every day  (10/20/2009)     Eating: drink diet soda or water instead of juice or soda, eat more vegetables, use fresh or frozen vegetables, eat foods that are low in salt, eat baked foods instead of fried foods, eat fruit for snacks and desserts, limit or avoid alcohol  (10/20/2009)     Activity: take a 30 minute walk every day  (03/16/2009)     Other: going up and down stairs for exercise (and walk) - chest pain stops her from exercising since stopped Isosorbide  (02/16/2009)    Diabetes self-management support: Written self-care plan  (10/20/2009)   Diabetes care plan printed    Hypertension self-management support: Written self-care plan  (10/20/2009)   Hypertension self-care plan printed.    Self-management comments: pt has bilateral knee pain   Nursing Instructions: GI referral for screening colonoscopy (see order) Request report of last diabetic eye exam   Laboratory Results   Blood Tests   Date/Time Received: October 20, 2009 10:29 AM  Date/Time Reported: Lenoria Farrier  October 20, 2009 10:29 AM   HGBA1C: 8.6%   (Normal Range:  Non-Diabetic - 3-6%   Control Diabetic - 6-8%) CBG Fasting:: 218mg /dL

## 2010-05-24 NOTE — Consult Note (Signed)
Summary: [facility]   Imported By: Bonner Puna 04/05/2009 16:53:19  _____________________________________________________________________  External Attachment:    Type:   Image     Comment:   External Document

## 2010-05-24 NOTE — Progress Notes (Signed)
Summary: Refill/gh  Phone Note Refill Request Message from:  Fax from Pharmacy on Sep 06, 2009 4:19 PM  Refills Requested: Medication #1:  LISINOPRIL-HYDROCHLOROTHIAZIDE 20-12.5 MG TABS Take 1 tablet by mouth once a day  Method Requested: Electronic Initial call taken by: Sander Nephew RN,  Sep 06, 2009 4:19 PM  Follow-up for Phone Call       Follow-up by: Neta Mends MD,  Sep 07, 2009 11:36 AM    Prescriptions: LISINOPRIL-HYDROCHLOROTHIAZIDE 20-12.5 MG TABS (LISINOPRIL-HYDROCHLOROTHIAZIDE) Take 1 tablet by mouth once a day  #30 x 4   Entered and Authorized by:   Neta Mends MD   Signed by:   Neta Mends MD on 09/07/2009   Method used:   Faxed to ...       [facility] (mail-order)       [address] [place], [place]  [address]       Ph: [phone]       Fax: [phone]   RxID:   [id number]

## 2010-05-24 NOTE — Miscellaneous (Signed)
Summary: HOSPITAL DISCHARGE 09/29/08  Clinical Lists Changes  "Hospital Discharge  Date of admission: 09/27/08  Date of discharge: 09/29/08  Brief reason for admission/active problems: Syncope. ECHO/ Carotid/ EEG WNL. Likely 2/2 over medication. Have held all BP meds and please follow in clinic and first med to restart would be Avalide.  Followup needed: Dr. Trinna Post 10/06/08 at 3 pm. Check BMET.   The medication and problem lists have been updated.  Please see the dictated discharge summary for details."   Patient Instructions: 1)  You have an appointment scheduled in the outpatient clinic which is in the [facility] hospital beneath the Emergency Dept. Your appointment is scheduled for 10/06/08 at 3 pm w/ Dr. Trinna Post.  2)  We ask that you discontinue all of your blood pressure medications for now (Avalide, Imdur, Tekturna, and Verapamil). We think that your fainting spell was related to this as all of our other tests have not shown another possible cause. We will likely restart your other medications beginning with the Avalide when we see you again in clinic.  3)  I have started you on Sennokot-S for constipation. I have sent over your information to one of our nurses, Mamie, to help you with the prescriptions. Our clinic phone number is [phone].    Problems: Added new problem of HYPERTENSION (ICD-401.1) - Signed Added new problem of DIABETES MELLITUS, INSULIN DEPENDENT (IDDM) (ICD-250.01) - Signed Added new problem of UNSPECIFIED ANEMIA (ICD-285.9) - Signed Added new problem of CAD (ICD-414.00) - Signed Added new problem of ASTHMA, UNSPECIFIED, UNSPECIFIED STATUS (ICD-493.90) - Signed Added new problem of KNEE JOINT REPLACEMENT BY OTHER MEANS (ICD-V43.65) - Signed Added new problem of CHOLECYSTECTOMY, HX OF (ICD-V45.79) - Signed Added new problem of APPENDECTOMY, HX OF (ICD-V45.79) - Signed Medications: Added new medication of ASPIRIN 81 MG TBEC (ASPIRIN) Take 1 tablet by mouth once a day  - Signed Added new medication of CLARITIN 10 MG CAPS (LORATADINE) Take 1 tablet by mouth once a day - Signed Added new medication of GLIMEPIRIDE 4 MG TABS (GLIMEPIRIDE) Take 1 tablet by mouth once a day - Signed Added new medication of PLAVIX 75 MG TABS (CLOPIDOGREL BISULFATE) Take 1 tablet by mouth once a day - Signed Added new medication of PROTONIX 20 MG TBEC (PANTOPRAZOLE SODIUM) Take 1 tablet by mouth once a day - Signed Added new medication of CRESTOR 40 MG TABS (ROSUVASTATIN CALCIUM) - Signed Added new medication of LANTUS 100 UNIT/ML SOLN (INSULIN GLARGINE) Take 20 units at bedtime - Signed Added new medication of VENTOLIN HFA 108 (90 BASE) MCG/ACT AERS (ALBUTEROL SULFATE) Take 1-2 puffs every four hours as needed for shortness of breath. - Signed Added new medication of SENOKOT S 8.6-50 MG TABS (SENNOSIDES-DOCUSATE SODIUM) Take one tab once a day as needed for constipation - Signed Rx of SENOKOT S 8.6-50 MG TABS (SENNOSIDES-DOCUSATE SODIUM) Take one tab once a day as needed for constipation;  #30 x 2;  Signed;  Entered by: Darlyne Russian MD;  Authorized by: Darlyne Russian MD;  Method used: Print then Give to Patient Observations: Added new observation of INSTRUCTIONS: You have an appointment scheduled in the outpatient clinic which is in the [facility] beneath the Emergency Dept. Your appointment is scheduled for 10/06/08 at 3 pm w/ Dr. Trinna Post.   We ask that you discontinue all of your blood pressure medications for now (Avalide, Imdur, Tekturna, and Verapamil). We think that your fainting spell was related to this as all of our other tests  have not shown another possible cause. We will likely restart your other medications beginning with the Avalide when we see you again in clinic.   I have started you on Sennokot-S for constipation. I have sent over your information to one of our nurses, Mamie, to help you with the prescriptions. Our clinic phone number is [phone].   (09/29/2008  10:31)    Prescriptions: SENOKOT S 8.6-50 MG TABS (SENNOSIDES-DOCUSATE SODIUM) Take one tab once a day as needed for constipation  #30 x 2   Entered and Authorized by:   Darlyne Russian MD   Signed by:   Darlyne Russian MD on 09/29/2008   Method used:   Print then Give to Patient   RxID:   [id number]

## 2010-05-24 NOTE — Letter (Signed)
Summary: ABC Sheet  ABC Sheet   Imported By: Bonner Puna 10/06/2008 16:39:54  _____________________________________________________________________  External Attachment:    Type:   Image     Comment:   External Document

## 2010-05-24 NOTE — Progress Notes (Signed)
Summary: unable to contact by phone/dmr  Phone Note Outgoing Call   Call placed by: Barnabas Harries RD,CDE,  January 13, 2009 2:08 PM Summary of Call: could not get through by phone to follow-up with patient regarding scheduling with doctor/CDE. Sent letter to listed address requesting patient call to make an appointment or let usknow if she is getting her medical care elsewhere.

## 2010-05-24 NOTE — Assessment & Plan Note (Signed)
Summary: 32MONTH F/U/EST/VS   Vital Signs:  Patient profile:   60 year old female Height:      63 inches (160.02 cm) Weight:      267.4 pounds (121.55 kg) BMI:     47.54 Temp:     98.0 degrees F (36.67 degrees C) oral Pulse rate:   94 / minute BP sitting:   143 / 78  (left arm) Cuff size:   large  Vitals Entered By: Nadine Counts Deborra Medina) (March 16, 2009 1:40 PM) CC: routine f/u dm, left shoulder pain, discuss meds Is Patient Diabetic? Yes Did you bring your meter with you today? Yes Pain Assessment Patient in pain? yes     Location: left shoulder Intensity: 10 Type: sharp Onset of pain  Constant for "months" Nutritional Status BMI of > 30 = obese  Have you ever been in a relationship where you felt threatened, hurt or afraid?No   Does patient need assistance? Functional Status Self care Ambulation Normal   CC:  routine f/u dm, left shoulder pain, and discuss meds.  History of Present Illness: Ann [name] is a 59 yo F with PMH of CAD s/p stent placement in 2005 who presents for follow-up following cardiac cath done 2 weeks ago. The cath showed patent stent and non-obstructive CAD for which cardiology recommended medical management. She was restarted on isosorbide dinitrate and started on lopressor. The patient has been doing well and says her CP has now resolved. She has no concerns today other than L shoulder discomfort when she rolls over in bed.  Preventive Screening-Counseling & Management  Alcohol-Tobacco     Alcohol drinks/day: 0     Smoking Status: never  Current Medications (verified): 1)  Aspirin 81 Mg Tbec (Aspirin) .... Take 1 Tablet By Mouth Once A Day 2)  Claritin 10 Mg Caps (Loratadine) .... Take 1 Tablet By Mouth Once A Day 3)  Glimepiride 4 Mg Tabs (Glimepiride) .... Take 1 Tablet By Mouth Once A Day 4)  Plavix 75 Mg Tabs (Clopidogrel Bisulfate) .... Take 1 Tablet By Mouth Once A Day 5)  Protonix 20 Mg Tbec (Pantoprazole Sodium) .... Take 1 Tablet  By Mouth Once A Day 6)  Crestor 40 Mg Tabs (Rosuvastatin Calcium) 7)  Lantus 100 Unit/ml Soln (Insulin Glargine) .... Take 20 Units At Bedtime 8)  Ventolin Hfa 108 (90 Base) Mcg/act Aers (Albuterol Sulfate) .... Take 1-2 Puffs Every Four Hours As Needed For Shortness of Breath. 9)  Senokot S 8.6-50 Mg Tabs (Sennosides-Docusate Sodium) .... Take One Tab Once A Day As Needed For Constipation 10)  Lisinopril-Hydrochlorothiazide 20-12.5 Mg Tabs (Lisinopril-Hydrochlorothiazide) .... Take 1 Tablet By Mouth Once A Day 11)  Lopressor 50 Mg Tabs (Metoprolol Tartrate) .... 1/2 Tablet Twice Daily 12)  Isosorbide Dinitrate 30 Mg Tabs (Isosorbide Dinitrate) .Marland Kitchen.. 1 Tab Daily 13)  Nitrostat 0.4 Mg Subl (Nitroglycerin) .... Dissolve Under Tongue Every 5 Minutes As Needed For Chest Pain Up To 3 Doses  Allergies: 1)  ! Codeine 2)  ! Flagyl  Review of Systems      See HPI  Physical Exam  General:  Well-developed,well-nourished,in no acute distress; alert,appropriate and cooperative throughout examination Head:  Normocephalic and atraumatic without obvious abnormalities. No apparent alopecia or balding. Lungs:  Normal respiratory effort, chest expands symmetrically. Lungs are clear to auscultation, no crackles or wheezes. Heart:  Normal rate and regular rhythm. S1 and S2 normal without gallop, murmur, click, rub or other extra sounds. Extremities:  1+ left pedal edema and 1+  right pedal edema.   Neurologic:  alert & oriented X3.   Psych:  Cognition and judgment appear intact. Alert and cooperative with normal attention span and concentration. No apparent delusions, illusions, hallucinations   Impression & Recommendations:  Problem # 1:  CHEST PAIN (ICD-786.50) Resolved. Cardiac cath 2 wks ago showed patent stent and non-obstructive CAD, pt started on lopressor and restarted on isosorbide per cardiology and doing well. She will follow-up with cardiology on Dec 7. Medicare will no longer cover Avalide and  we know of no reason why she can't take lisinopril, so an rx of lisinopril-HCTZ was re-called in (Dr. Stann Mainland initially called it in a couple weeks ago but pharmacy never received the order).   Problem # 2:  DIABETES MELLITUS, INSULIN DEPENDENT (IDDM) (ICD-250.01) Pt's meter erased her CBG readings when she changed the battery, so all the meter log we have is for the past 2 days. She says she's usually 100-120 in AM, which increases to mid-upper 200s by nighttime. She is only on glimepiride and lantus 20 units at bedtime. I will set up an appt for her with Barnabas Harries and have her come back to clinic in the next month or so to adjust her medications as necessary.  Her updated medication list for this problem includes:    Aspirin 81 Mg Tbec (Aspirin) .Marland Kitchen... Take 1 tablet by mouth once a day    Glimepiride 4 Mg Tabs (Glimepiride) .Marland Kitchen... Take 1 tablet by mouth once a day    Lantus 100 Unit/ml Soln (Insulin glargine) .Marland Kitchen... Take 20 units at bedtime    Lisinopril-hydrochlorothiazide 20-12.5 Mg Tabs (Lisinopril-hydrochlorothiazide) .Marland Kitchen... Take 1 tablet by mouth once a day  Problem # 3:  SHOULDER PAIN, LEFT (ICD-719.41) Pt with L shoulder pain/stiffness at night when she changes position. I recommended she use Tylenol and a heating pad on light heat for a few minutes to see if that helps. May be arthritic vs mild muscle strain.  Her updated medication list for this problem includes:    Aspirin 81 Mg Tbec (Aspirin) .Marland Kitchen... Take 1 tablet by mouth once a day  Complete Medication List: 1)  Aspirin 81 Mg Tbec (Aspirin) .... Take 1 tablet by mouth once a day 2)  Claritin 10 Mg Caps (Loratadine) .... Take 1 tablet by mouth once a day 3)  Glimepiride 4 Mg Tabs (Glimepiride) .... Take 1 tablet by mouth once a day 4)  Plavix 75 Mg Tabs (Clopidogrel bisulfate) .... Take 1 tablet by mouth once a day 5)  Protonix 20 Mg Tbec (Pantoprazole sodium) .... Take 1 tablet by mouth once a day 6)  Crestor 40 Mg Tabs (Rosuvastatin  calcium) 7)  Lantus 100 Unit/ml Soln (Insulin glargine) .... Take 20 units at bedtime 8)  Ventolin Hfa 108 (90 Base) Mcg/act Aers (Albuterol sulfate) .... Take 1-2 puffs every four hours as needed for shortness of breath. 9)  Senokot S 8.6-50 Mg Tabs (Sennosides-docusate sodium) .... Take one tab once a day as needed for constipation 10)  Lisinopril-hydrochlorothiazide 20-12.5 Mg Tabs (Lisinopril-hydrochlorothiazide) .... Take 1 tablet by mouth once a day 11)  Lopressor 50 Mg Tabs (Metoprolol tartrate) .... 1/2 tablet twice daily 12)  Isosorbide Dinitrate 30 Mg Tabs (Isosorbide dinitrate) .Marland Kitchen.. 1 tab daily 13)  Nitrostat 0.4 Mg Subl (Nitroglycerin) .... Dissolve under tongue every 5 minutes as needed for chest pain up to 3 doses  Other Orders: Mammogram (Screening) (Mammo) Admin 1st Vaccine [id number]) Flu Vaccine 81yrs + [id number])  Patient Instructions: 1)  Please schedule a follow-up appointment in 1-2 months. Please schedule an appointment to see Barnabas Harries as soon as possible. 2)  Please notify us or your cardiologist if your chest pain worsens or call 911 if you have any concerning symptoms. 3)  For your shoulder pain, use Tylenol and a heating pad on light heat. Make sure to use it for only a few minutes at a time and be careful not to burn your skin.   Prevention & Chronic Care Immunizations   Influenza vaccine: Fluvax 3+  (03/16/2009)    Tetanus booster: Not documented    Pneumococcal vaccine: Not documented  Colorectal Screening   Hemoccult: Not documented    Colonoscopy: Not documented  Other Screening   Pap smear: Not documented    Mammogram: Not documented   Mammogram action/deferral: Ordered  (03/16/2009)  Reports requested:  Smoking status: never  (03/16/2009)  Diabetes Mellitus   HgbA1C: 8.7  (02/16/2009)   HgbA1C action/deferral: Ordered  (02/16/2009)    Eye exam: Not documented   Last eye exam report requested.    Foot exam: yes  (10/06/2008)   High risk  foot: Not documented   Foot care education: Done  (10/06/2008)    Urine microalbumin/creatinine ratio: Not documented  Lipids   Total Cholesterol: 114  (02/16/2009)   LDL: 59  (02/16/2009)   LDL Direct: Not documented   HDL: 32  (02/16/2009)   Triglycerides: 114  (02/16/2009)  Hypertension   Last Blood Pressure: 143 / 78  (03/16/2009)   Serum creatinine: 1.60  (02/16/2009)   Serum potassium 4.1  (02/16/2009)  Self-Management Support :    Patient will work on the following items until the next clinic visit to reach self-care goals:     Medications and monitoring: take my medicines every day, check my blood sugar  (03/16/2009)     Eating: drink diet soda or water instead of juice or soda, eat more vegetables, eat foods that are low in salt, eat baked foods instead of fried foods  (03/16/2009)     Activity: take a 30 minute walk every day  (03/16/2009)     Other: going up and down stairs for exercise (and walk) - chest pain stops her from exercising since stopped Isosorbide  (02/16/2009)    Diabetes self-management support: Copy of home glucose meter record  (03/16/2009)    Hypertension self-management support: Not documented   Nursing Instructions: Schedule screening mammogram (see order) Request report of last diabetic eye exam-[facility] ph# [phone] next appt nov 30th Give Flu vaccine today  Flu Vaccine Consent Questions     Do you have a history of severe allergic reactions to this vaccine? no    Any prior history of allergic reactions to egg and/or gelatin? no    Do you have a sensitivity to the preservative Thimersol? no    Do you have a past history of Guillan-Barre Syndrome? no    Do you currently have an acute febrile illness? no    Have you ever had a severe reaction to latex? no    Vaccine information given and explained to patient? yes    Are you currently pregnant? no    Lot [id number]   Exp Date:07/22/2009   Manufacturer: [employer]    Site Given   right Deltoid IM.Nadine Counts Deborra Medina)  March 16, 2009 2:14 PM  Schedule screening mammogram (see order) Request report of last diabetic eye exam-[facility] ph# [phone] next appt nov 30th Give Pneumovax today Give Flu vaccine  today  .mchsflu

## 2010-05-24 NOTE — Letter (Signed)
Summary: Anticoagulation Modification Letter  [facility]  [place], [place] [address]   Phone: [phone]  Fax: [phone]    November 01, 2009  Re:    Ann [name] DOB:    March 28, 1951 MRN:    [id number]    Dear Avelina Laine,  We have scheduled the above patient for an endoscopic procedure. Our records show that  she is on anticoagulation therapy. Please advise as to how long the patient may come off their therapy of Plavix  prior to the scheduled Colonoscopy  on 12-02-09.   Please fax back the completed form to: [phone], ATTN: Neoma Laming  Thank you for your help with this matter.  Sincerely,  Vivia Ewing LPN   [facility] Recommendation:  Hold Plavix 7 days prior ________________  Hold Coumadin 5 days prior ____________  Other ______________________________

## 2010-05-24 NOTE — Miscellaneous (Signed)
Summary: [facility]  Clinical Lists Changes  Medications: Added new medication of MIRALAX   POWD (POLYETHYLENE GLYCOL 3350) As per prep  instructions. - Signed Added new medication of DULCOLAX 5 MG  TBEC (BISACODYL) Day before procedure take 2 at 3pm and 2 at 8pm. - Signed Added new medication of REGLAN 10 MG  TABS (METOCLOPRAMIDE HCL) As per prep instructions. - Signed Rx of MIRALAX   POWD (POLYETHYLENE GLYCOL 3350) As per prep  instructions.;  #255gm x 0;  Signed;  Entered by: Ulice Dash RN;  Authorized by: Clayborne Artist;  Method used: Print then Give to Patient Rx of DULCOLAX 5 MG  TBEC (BISACODYL) Day before procedure take 2 at 3pm and 2 at 8pm.;  #4 x 0;  Signed;  Entered by: Ulice Dash RN;  Authorized by: Clayborne Artist;  Method used: Print then Give to Patient Rx of REGLAN 10 MG  TABS (METOCLOPRAMIDE HCL) As per prep instructions.;  #2 x 0;  Signed;  Entered by: Ulice Dash RN;  Authorized by: Clayborne Artist;  Method used: Print then Give to Patient Allergies: Changed allergy or adverse reaction from CODEINE to CODEINE Changed allergy or adverse reaction from FLAGYL to FLAGYL    Prescriptions: REGLAN 10 MG  TABS (METOCLOPRAMIDE HCL) As per prep instructions.  #2 x 0   Entered by:   Ulice Dash RN   Authorized by:   Clayborne Artist   Signed by:   Ulice Dash RN on 11/18/2009   Method used:   Print then Give to Patient   RxID:   [id number] DULCOLAX 5 MG  TBEC (BISACODYL) Day before procedure take 2 at 3pm and 2 at 8pm.  #4 x 0   Entered by:   Ulice Dash RN   Authorized by:   Clayborne Artist   Signed by:   Ulice Dash RN on 11/18/2009   Method used:   Print then Give to Patient   RxID:   [id number] MIRALAX   POWD (POLYETHYLENE GLYCOL 3350) As per prep  instructions.  #255gm x 0   Entered by:   Ulice Dash RN   Authorized by:   Clayborne Artist   Signed by:   Ulice Dash RN on 11/18/2009   Method used:   Print then Give to Patient   RxID:   [id number]

## 2010-05-24 NOTE — Letter (Signed)
Summary: Diabetic Instructions  [facility]  [place], [place] [address]   Phone: [phone]  Fax: [phone]    Ann [name] [birth year] MRN: [id number]   _  _   ORAL DIABETIC MEDICATION INSTRUCTIONS  The day before your procedure:   Take your diabetic pill as you do normally  The day of your procedure:   Do not take your diabetic pill    We will check your blood sugar levels during the admission process and again in Recovery before discharging you home  ________________________________________________________________________  _  _   INSULIN (LONG ACTING) MEDICATION INSTRUCTIONS (Lantus, NPH, 70/30, Humulin, Novolin-N)   The day before your procedure:   Take  your regular evening dose    The day of your procedure:   Do not take your morning dose

## 2010-05-24 NOTE — Progress Notes (Signed)
Summary: refill/gg  Phone Note Refill Request  on August 03, 2009 11:56 AM  Refills Requested: Medication #1:  CRESTOR 40 MG TABS **crestor not covered by insurance.Marland Kitchen...will you change to simvastatin, lovastatin or pravastatin   Method Requested: Mail to Pharmacy Initial call taken by: Gevena Cotton RN,  August 03, 2009 11:56 AM  Follow-up for Phone Call        Does her insurance take Lipitor? If not, I will change her to simvastatin. Let me know. Thanks! Follow-up by: Neta Mends MD,  August 03, 2009 2:54 PM  Additional Follow-up for Phone Call Additional follow up Details #1::        Only the 3 listed above Gevena Cotton RN  August 04, 2009 5:05 PM     Additional Follow-up for Phone Call Additional follow up Details #2::    Sent in a rx for simvastatin. I would recommend she follow-up with her cardiologist as well, as he may have other options for her (and also because she was on a high Crestor dose, which is much more potent than simvastatin). Thanks! Follow-up by: Neta Mends MD,  August 04, 2009 7:29 PM  Additional Follow-up for Phone Call Additional follow up Details #3:: Details for Additional Follow-up Action Taken: Pt informed verbalizes understanding of these instructions.  Additional Follow-up by: Gevena Cotton RN,  August 10, 2009 10:07 AM  New/Updated Medications: SIMVASTATIN 80 MG TABS (SIMVASTATIN) Take 1 tablet daily Prescriptions: SIMVASTATIN 80 MG TABS (SIMVASTATIN) Take 1 tablet daily  #30 x 6   Entered and Authorized by:   Neta Mends MD   Signed by:   Neta Mends MD on 08/04/2009   Method used:   Faxed to ...       [facility] (mail-order)       [address] [place], [place]  [address]       Ph: [phone]       Fax: [phone]   RxID:   [id number]

## 2010-05-24 NOTE — Progress Notes (Signed)
Summary: refill/gg  Phone Note Refill Request  on November 01, 2009 3:11 PM  Refills Requested: Medication #1:  PLAVIX 75 MG TABS Take 1 tablet by mouth once a day  Medication #2:  LANTUS SOLOSTAR 100 UNIT/ML SOLN inject 20 units at bedtime  Medication #3:  GLIMEPIRIDE 4 MG TABS Take 1 tablet by mouth once a day  Medication #4:  SIMVASTATIN 40 MG TABS  Method Requested: Fax to Local Pharmacy Initial call taken by: Gevena Cotton RN,  November 01, 2009 3:11 PM  Follow-up for Phone Call        Refill approved-nurse to complete Follow-up by: Milta Deiters MD,  November 01, 2009 5:07 PM  Additional Follow-up for Phone Call Additional follow up Details #1::        Rx faxed to pharmacy Additional Follow-up by: Gevena Cotton RN,  November 02, 2009 2:36 PM    Prescriptions: SIMVASTATIN 40 MG TABS (SIMVASTATIN)   #30 x 1   Entered and Authorized by:   Milta Deiters MD   Signed by:   Milta Deiters MD on 11/01/2009   Method used:   Telephoned to ...       [facility] (mail-order)       [address] [place], [place]  [address]       Ph: [phone]       Fax: [phone]   RxID:   [id number] LANTUS SOLOSTAR 100 UNIT/ML SOLN (INSULIN GLARGINE) inject 20 units at bedtime  #1 month x 3   Entered and Authorized by:   Milta Deiters MD   Signed by:   Milta Deiters MD on 11/01/2009   Method used:   Telephoned to ...       [facility] (mail-order)       [address] [place], [place]  [address]       Ph: [phone]       Fax: [phone]   RxID:   [id number] PLAVIX 75 MG TABS (CLOPIDOGREL BISULFATE) Take 1 tablet by mouth once a day  #30 x 3   Entered and Authorized by:   Milta Deiters MD   Signed by:   Milta Deiters MD on 11/01/2009   Method used:   Telephoned to ...       [facility] (mail-order)       [address] [place], [place]  [address]       Ph: [phone]       Fax: [phone]   RxID:    [id number] GLIMEPIRIDE 4 MG TABS (GLIMEPIRIDE) Take 1 tablet by mouth once a day  #30 x 3   Entered and Authorized by:   Milta Deiters MD   Signed by:   Milta Deiters MD on 11/01/2009   Method used:   Telephoned to ...       [facility] (mail-order)       [address] [place],   [address]       Ph: [phone]       Fax: [phone]   RxID:   [id number]

## 2010-05-24 NOTE — Letter (Signed)
Summary: Previsit letter  [facility]  [place], [place] [address]   Phone: [phone]  Fax: [phone]       10/27/2009 MRN: [id number]  [facility] [address] [place], [place]  [address]  Dear Ms. Enberg,  Welcome to the Gastroenterology Division at [facility].    You are scheduled to see a nurse for your pre-procedure visit on 11-18-09 at 11:00a.m. on the 3rd floor at [facility], [place] [employer].  We ask that you try to arrive at our office 15 minutes prior to your appointment time to allow for check-in.  Your nurse visit will consist of discussing your medical and surgical history, your immediate family medical history, and your medications.    Please bring a complete list of all your medications or, if you prefer, bring the medication bottles and we will list them.  We will need to be aware of both prescribed and over the counter drugs.  We will need to know exact dosage information as well.  If you are on blood thinners (Coumadin, Plavix, Aggrenox, Ticlid, etc.) please call our office today/prior to your appointment, as we need to consult with your physician about holding your medication.   Please be prepared to read and sign documents such as consent forms, a financial agreement, and acknowledgement forms.  If necessary, and with your consent, a friend or relative is welcome to sit-in on the nurse visit with you.  Please bring your insurance card so that we may make a copy of it.  If your insurance requires a referral to see a specialist, please bring your referral form from your primary care physician.  No co-pay is required for this nurse visit.     If you cannot keep your appointment, please call [phone] to cancel or reschedule prior to your appointment date.  This allows [place] the opportunity to schedule an appointment for another patient in need of care.    Thank you for choosing [facility] for your medical needs.   We appreciate the opportunity to care for you.  Please visit [place] at our website  to learn more about our practice.                     Sincerely.                                                                                                                   The Gastroenterology Division

## 2010-05-24 NOTE — Progress Notes (Signed)
Summary: TRIAGE-Plavix  Phone Note Call from Patient Call back at Home Phone [phone]   Caller: Patient Call For: Dr. Olevia Perches Reason for Call: Talk to Nurse Summary of Call: calling to inform that she is on Plavix and has a COL coming up Initial call taken by: Lucien Mons,  October 29, 2009 4:22 PM  Follow-up for Phone Call        Pt. has a Colon scheduled for 12-02-09 (Previsit is 11-18-09) She states she had one many years ago in another town, "For bleeding hemorrhoids" Denies polyps. Pt. has been  on ASA and Plavix for several years, ordered by Dr.Riofrio.  Pt. states she did hold ASA/Plavix for knee surgery 2 years ago.   [facility]orum PLEASE ADVISE  Follow-up by: Vivia Ewing LPN,  July  8, 624THL 075-GRM PM  Additional Follow-up for Phone Call Additional follow up Details #1::        We will need a permission from Dr Gwenlyn Found or [facility] to hold the Plavix pror to the procedure. Additional Follow-up by: Lafayette Dragon MD,  October 29, 2009 11:04 PM    Additional Follow-up for Phone Call Additional follow up Details #2::    Anticoag. instruction form has been faxed to Dr.Berry at [phone]. Vivia Ewing LPN  July 11, 624THL [id number] AM   Dr.Berry approved for pt. to hold her Plavix 7 days prior to Colonoscopy. Pt. is aware. I will add the note to her previsit information. Pt. instructed to call back as needed.  Follow-up by: Vivia Ewing LPN,  July 13, 624THL [id number] PM

## 2010-07-05 ENCOUNTER — Encounter: Payer: Self-pay | Admitting: Internal Medicine

## 2010-07-06 ENCOUNTER — Other Ambulatory Visit: Payer: Self-pay | Admitting: Internal Medicine

## 2010-07-08 ENCOUNTER — Other Ambulatory Visit: Payer: Self-pay | Admitting: *Deleted

## 2010-07-08 LAB — GLUCOSE, CAPILLARY
Glucose-Capillary: 144 mg/dL — ABNORMAL HIGH (ref 70–99)
Glucose-Capillary: 102 mg/dL — ABNORMAL HIGH (ref 70–99)
Glucose-Capillary: 92 mg/dL (ref 70–99)

## 2010-07-08 NOTE — Telephone Encounter (Signed)
[facility] states patient reports she is having muscle pain on Simvastatin and has been for yrs while on this medication and other medications. Also the FDA has changed the max. Recommended dose to 20mg  in pts taking amlodipine concomitantly. And to consider reducing dose of Simvastatin or changing to alternative lipid-lowering agent. Thanks

## 2010-07-10 LAB — GLUCOSE, CAPILLARY: Glucose-Capillary: 218 mg/dL — ABNORMAL HIGH (ref 70–99)

## 2010-07-11 MED ORDER — AMLODIPINE BESYLATE 5 MG PO TABS: 5.0000 mg | ORAL_TABLET | Freq: Every day | ORAL | Status: DC

## 2010-07-11 MED ORDER — NIACIN ER (ANTIHYPERLIPIDEMIC) 500 MG PO TBCR: 1000.0000 mg | EXTENDED_RELEASE_TABLET | Freq: Every day | ORAL | Status: DC

## 2010-07-12 ENCOUNTER — Telehealth: Payer: Self-pay | Admitting: *Deleted

## 2010-07-12 NOTE — Telephone Encounter (Signed)
Pt called with c/o rt side of neck pain with radiation to rt shoulder and hand.  Onset 1 week ago. Also muscles in both legs are sore, this is not new onset over a year ago.  She states she informed her doctor.  Pt on Zocor for 1 year.  Appointment scheduled.  Offered for tomorrow but pt can't come in until Monday

## 2010-07-13 ENCOUNTER — Telehealth: Payer: Self-pay | Admitting: *Deleted

## 2010-07-13 DIAGNOSIS — E785 Hyperlipidemia, unspecified: Secondary | ICD-10-CM

## 2010-07-13 NOTE — Telephone Encounter (Signed)
Pt has reported to pharmacy that she is experiencing pain on the simvastatin and has for years with this medication a d other statins.  Maximum recommended dose per pharmacy is 20 mg.  Would you consider reducing the dosage or changing to another lipid-lowering agent.

## 2010-07-18 ENCOUNTER — Ambulatory Visit (INDEPENDENT_AMBULATORY_CARE_PROVIDER_SITE_OTHER): Payer: Medicaid Other | Admitting: Internal Medicine

## 2010-07-18 ENCOUNTER — Encounter: Payer: Self-pay | Admitting: Internal Medicine

## 2010-07-18 ENCOUNTER — Other Ambulatory Visit: Payer: Self-pay | Admitting: Internal Medicine

## 2010-07-18 VITALS — BP 132/67 | HR 74 | Temp 97.4°F | Resp 18 | Ht 62.0 in | Wt 255.5 lb

## 2010-07-18 DIAGNOSIS — IMO0001 Reserved for inherently not codable concepts without codable children: Secondary | ICD-10-CM

## 2010-07-18 DIAGNOSIS — I10 Essential (primary) hypertension: Secondary | ICD-10-CM

## 2010-07-18 DIAGNOSIS — E109 Type 1 diabetes mellitus without complications: Secondary | ICD-10-CM

## 2010-07-18 DIAGNOSIS — M791 Myalgia, unspecified site: Secondary | ICD-10-CM | POA: Insufficient documentation

## 2010-07-18 LAB — COMPREHENSIVE METABOLIC PANEL
ALT: 12 U/L (ref 0–35)
AST: 16 U/L (ref 0–37)
Albumin: 3.1 g/dL — ABNORMAL LOW (ref 3.5–5.2)
Alkaline Phosphatase: 63 U/L (ref 39–117)
BUN: 19 mg/dL (ref 6–23)
CO2: 23 mEq/L (ref 19–32)
Calcium: 8.4 mg/dL (ref 8.4–10.5)
Chloride: 102 mEq/L (ref 96–112)
Creat: 1.86 mg/dL — ABNORMAL HIGH (ref 0.40–1.20)
Glucose, Bld: 259 mg/dL — ABNORMAL HIGH (ref 70–99)
Potassium: 4.3 mEq/L (ref 3.5–5.3)
Sodium: 132 mEq/L — ABNORMAL LOW (ref 135–145)
Total Bilirubin: 0.3 mg/dL (ref 0.3–1.2)
Total Protein: 6.7 g/dL (ref 6.0–8.3)

## 2010-07-18 LAB — CK TOTAL AND CKMB (NOT AT ARMC)
CK, MB: 1.3 ng/mL (ref 0.3–4.0)
Total CK: 80 U/L (ref 7–177)

## 2010-07-18 LAB — POCT GLYCOSYLATED HEMOGLOBIN (HGB A1C): Hemoglobin A1C: 10.4

## 2010-07-18 MED ORDER — PRAVASTATIN SODIUM 40 MG PO TABS: 40.0000 mg | ORAL_TABLET | Freq: Every evening | ORAL | Status: DC

## 2010-07-18 MED ORDER — SIMVASTATIN 40 MG PO TABS: 20.0000 mg | ORAL_TABLET | Freq: Every day | ORAL | Status: DC

## 2010-07-18 MED ORDER — INSULIN GLARGINE 100 UNIT/ML ~~LOC~~ SOLN: 65.0000 [IU] | Freq: Every day | SUBCUTANEOUS | Status: DC

## 2010-07-18 NOTE — Progress Notes (Signed)
Subjective:    Patient ID: Ann [name], female    DOB: 1950/08/30, 60 y.o.   MRN: [id number]  HPI Patient is a 60 years old AAF with past medical history  as outlined here who comes to the Clinic for regular f/u and refill her zocor and both leg muscle pain. She said she has both thigh muscle pain since she took zocor, worse on walking and better on rest, has not checked CK. Her CBg has been 300s and had eye exam last Novemeber. No other c/o, including fever, chill, chest pain, shortness of breath, hemoptysis, abdominal pain, nausea, vomiting, diarrhea, melena, dysuria, significant weight change. Denies recent smoking, alcohol or drug abuse. Has been taking all his medications as instructed.  Has tried to llose weight.   Review of Systems Per HPI.  Current Outpatient Medications Current Outpatient Prescriptions  Medication Sig Dispense Refill  . albuterol (VENTOLIN HFA) 108 (90 BASE) MCG/ACT inhaler Inhale 2 puffs into the lungs every 4 (four) hours as needed. for shortness of breath       . amLODipine (NORVASC) 5 MG tablet Take 1 tablet (5 mg total) by mouth daily.  30 tablet  3  . aspirin 81 MG tablet Take 81 mg by mouth daily.        . insulin glargine (LANTUS SOLOSTAR) 100 UNIT/ML injection Inject 55 Units into the skin at bedtime.        . isosorbide dinitrate (ISORDIL) 30 MG tablet Take 30 mg by mouth daily.        Marland Kitchen lisinopril-hydrochlorothiazide (PRINZIDE,ZESTORETIC) 20-12.5 MG per tablet TAKE 2 TABLETS BY MOUTH EVERY DAY  60 tablet  PRN  . loratadine (CLARITIN) 10 MG tablet Take 10 mg by mouth daily.        . metoprolol (LOPRESSOR) 50 MG tablet TAKE 1 TABLET BY MOUTH TWICE DAILY  60 tablet  PRN  . niacin (NIASPAN) 500 MG CR tablet Take 2 tablets (1,000 mg total) by mouth at bedtime.  60 tablet  3  . nitroGLYCERIN (NITROSTAT) 0.4 MG SL tablet Place 0.4 mg under the tongue every 5 (five) minutes as needed. As needed for chest pain up to 3 doses       . pantoprazole (PROTONIX) 20 MG  tablet TAKE 1 TABLET BY MOUTH DAILY  30 tablet  PRN  . PLAVIX 75 MG tablet TAKE 1 TABLET BY MOUTH DAILY  30 tablet  PRN  . sennosides-docusate sodium (SENOKOT-S) 8.6-50 MG tablet Take 1 tablet by mouth as needed. For constipation       . simvastatin (ZOCOR) 40 MG tablet TAKE 1 TABLET BY MOUTH DAILY  30 tablet  PRN    Allergies Codeine and Metronidazole  Past Medical History  Diagnosis Date  . Hyperlipidemia 10/21/2009  . Chronic kidney disease (CKD), stage III (moderate) 10/20/2009  . Shoulder pain, left 03/16/2009    occurs at night  . Lumbar back pain 02/16/2009    states pain goes down right leg  . Myalgia 02/16/2009  . Chest pain 02/16/2009  . Asthma 09/29/2008  . CAD (coronary artery disease) 09/29/2008  . Anemia 09/29/2008    anemia of chronic disease  . Insulin dependent diabetes mellitus type IA 09/29/2008  . Hypertension 09/29/2008    Past Surgical History  Procedure Date  . Appendectomy   . Cholecystectomy   . Total knee arthroplasty     Left       Objective:   Physical Exam General: Vital signs reviewed and noted. Well-developed,well-nourished,in no acute  distress; alert,appropriate and cooperative throughout examination. Head: normocephalic, atraumatic. Neck: No deformities, masses, or tenderness noted. Lungs: Normal respiratory effort. Clear to auscultation BL without crackles or wheezes.  Heart: RRR. S1 and S2 normal without gallop, murmur, or rubs.  Abdomen: BS normoactive. Soft, Nondistended, non-tender.  No masses or organomegaly. Extremities: No pretibial edema. Both thigh has muscle tenderness, no redness or swelling.          Assessment & Plan:

## 2010-07-18 NOTE — Assessment & Plan Note (Addendum)
Lab Results  Component Value Date   HGBA1C 10.4 07/18/2010   HGBA1C 8.6 10/20/2009   CREATININE 1.64* 11/19/2009   CHOL 114 10/21/2009   HDL 37* 10/21/2009   TRIG 101 10/21/2009    Her DM poorly controlled with current dose of lantus and CBG >300, did not bring the glucometer with her today. Will increase her lantus to 65 units from 55 and DM education. Asker her to bring the glucometer at next visit in a month.

## 2010-07-18 NOTE — Assessment & Plan Note (Signed)
She has this symptom since she took zocor, so this may be due to its side effects and will decrease its dose to 20 and check CK. Recheck at next visit.

## 2010-07-18 NOTE — Assessment & Plan Note (Signed)
Lab Results  Component Value Date   NA 141 11/19/2009   K 4.5 11/19/2009   CL 107 11/19/2009   CO2 25 11/19/2009   BUN 23 11/19/2009   CREATININE 1.64* 11/19/2009    BP Readings from Last 3 Encounters:  07/18/10 132/67  12/20/09 174/87  11/19/09 193/84   BP well controlled and will continue current meds and check CMET.

## 2010-07-18 NOTE — Telephone Encounter (Signed)
Agree, I have changed her simvastatin to pravastatin and send a script to pharmacy.

## 2010-07-18 NOTE — Patient Instructions (Signed)
Please take all your medications as instructed in your instructions.   We will call you if any abnormal lab results.  Please call the Clinic for appointment or go to Emergency Department if your symptoms do not improve or get worse.  Please bring your glucometer with you at next visit.

## 2010-07-20 LAB — GLUCOSE, CAPILLARY: Glucose-Capillary: 241 mg/dL — ABNORMAL HIGH (ref 70–99)

## 2010-07-27 ENCOUNTER — Ambulatory Visit: Payer: Self-pay | Admitting: Internal Medicine

## 2010-07-27 LAB — BASIC METABOLIC PANEL
BUN: 21 mg/dL (ref 6–23)
BUN: 24 mg/dL — ABNORMAL HIGH (ref 6–23)
CO2: 22 mEq/L (ref 19–32)
CO2: 26 mEq/L (ref 19–32)
Calcium: 8.6 mg/dL (ref 8.4–10.5)
Calcium: 8.6 mg/dL (ref 8.4–10.5)
Chloride: 100 mEq/L (ref 96–112)
Chloride: 107 mEq/L (ref 96–112)
Creatinine, Ser: 1.6 mg/dL — ABNORMAL HIGH (ref 0.4–1.2)
Creatinine, Ser: 1.74 mg/dL — ABNORMAL HIGH (ref 0.4–1.2)
GFR calc Af Amer: 36 mL/min — ABNORMAL LOW (ref >60)
GFR calc Af Amer: 40 mL/min — ABNORMAL LOW (ref >60)
GFR calc non Af Amer: 30 mL/min — ABNORMAL LOW (ref >60)
GFR calc non Af Amer: 33 mL/min — ABNORMAL LOW (ref >60)
Glucose, Bld: 120 mg/dL — ABNORMAL HIGH (ref 70–99)
Glucose, Bld: 61 mg/dL — ABNORMAL LOW (ref 70–99)
Potassium: 3.3 mEq/L — ABNORMAL LOW (ref 3.5–5.1)
Potassium: 3.8 mEq/L (ref 3.5–5.1)
Sodium: 138 mEq/L (ref 135–145)
Sodium: 140 mEq/L (ref 135–145)

## 2010-07-27 LAB — GLUCOSE, CAPILLARY
Glucose-Capillary: 107 mg/dL — ABNORMAL HIGH (ref 70–99)
Glucose-Capillary: 116 mg/dL — ABNORMAL HIGH (ref 70–99)
Glucose-Capillary: 117 mg/dL — ABNORMAL HIGH (ref 70–99)
Glucose-Capillary: 53 mg/dL — ABNORMAL LOW (ref 70–99)
Glucose-Capillary: 96 mg/dL (ref 70–99)

## 2010-07-28 LAB — GLUCOSE, CAPILLARY: Glucose-Capillary: 126 mg/dL — ABNORMAL HIGH (ref 70–99)

## 2010-08-01 ENCOUNTER — Encounter: Payer: Self-pay | Admitting: Internal Medicine

## 2010-08-01 LAB — CBC
HCT: 25.1 % — ABNORMAL LOW (ref 36.0–46.0)
HCT: 25.5 % — ABNORMAL LOW (ref 36.0–46.0)
HCT: 25.6 % — ABNORMAL LOW (ref 36.0–46.0)
HCT: 28.3 % — ABNORMAL LOW (ref 36.0–46.0)
HCT: 28.9 % — ABNORMAL LOW (ref 36.0–46.0)
Hemoglobin: 8.1 g/dL — ABNORMAL LOW (ref 12.0–15.0)
Hemoglobin: 8.4 g/dL — ABNORMAL LOW (ref 12.0–15.0)
Hemoglobin: 8.6 g/dL — ABNORMAL LOW (ref 12.0–15.0)
Hemoglobin: 9.9 g/dL — ABNORMAL LOW (ref 12.0–15.0)
Hemoglobin: 9.9 g/dL — ABNORMAL LOW (ref 12.0–15.0)
MCHC: 32.1 g/dL (ref 30.0–36.0)
MCHC: 33 g/dL (ref 30.0–36.0)
MCHC: 33.7 g/dL (ref 30.0–36.0)
MCHC: 34.4 g/dL (ref 30.0–36.0)
MCHC: 35 g/dL (ref 30.0–36.0)
MCV: 81.7 fL (ref 78.0–100.0)
MCV: 81.8 fL (ref 78.0–100.0)
MCV: 82.1 fL (ref 78.0–100.0)
MCV: 82.4 fL (ref 78.0–100.0)
MCV: 82.7 fL (ref 78.0–100.0)
Platelets: 173 10*3/uL (ref 150–400)
Platelets: 177 10*3/uL (ref 150–400)
Platelets: 182 10*3/uL (ref 150–400)
Platelets: 195 10*3/uL (ref 150–400)
Platelets: 93 10*3/uL — ABNORMAL LOW (ref 150–400)
RBC: 3.05 MIL/uL — ABNORMAL LOW (ref 3.87–5.11)
RBC: 3.09 MIL/uL — ABNORMAL LOW (ref 3.87–5.11)
RBC: 3.12 MIL/uL — ABNORMAL LOW (ref 3.87–5.11)
RBC: 3.45 MIL/uL — ABNORMAL LOW (ref 3.87–5.11)
RBC: 3.53 MIL/uL — ABNORMAL LOW (ref 3.87–5.11)
RDW: 16.8 % — ABNORMAL HIGH (ref 11.5–15.5)
RDW: 16.8 % — ABNORMAL HIGH (ref 11.5–15.5)
RDW: 17 % — ABNORMAL HIGH (ref 11.5–15.5)
RDW: 17.2 % — ABNORMAL HIGH (ref 11.5–15.5)
RDW: 17.2 % — ABNORMAL HIGH (ref 11.5–15.5)
WBC: 2.8 10*3/uL — ABNORMAL LOW (ref 4.0–10.5)
WBC: 3.9 10*3/uL — ABNORMAL LOW (ref 4.0–10.5)
WBC: 4 10*3/uL (ref 4.0–10.5)
WBC: 4.6 10*3/uL (ref 4.0–10.5)
WBC: 6.3 10*3/uL (ref 4.0–10.5)

## 2010-08-01 LAB — BASIC METABOLIC PANEL
BUN: 16 mg/dL (ref 6–23)
BUN: 16 mg/dL (ref 6–23)
BUN: 21 mg/dL (ref 6–23)
CO2: 22 mEq/L (ref 19–32)
CO2: 23 mEq/L (ref 19–32)
CO2: 25 mEq/L (ref 19–32)
Calcium: 8.4 mg/dL (ref 8.4–10.5)
Calcium: 8.8 mg/dL (ref 8.4–10.5)
Calcium: 8.8 mg/dL (ref 8.4–10.5)
Chloride: 110 mEq/L (ref 96–112)
Chloride: 111 mEq/L (ref 96–112)
Chloride: 115 mEq/L — ABNORMAL HIGH (ref 96–112)
Creatinine, Ser: 1.7 mg/dL — ABNORMAL HIGH (ref 0.4–1.2)
Creatinine, Ser: 1.73 mg/dL — ABNORMAL HIGH (ref 0.4–1.2)
Creatinine, Ser: 2.13 mg/dL — ABNORMAL HIGH (ref 0.4–1.2)
GFR calc Af Amer: 29 mL/min — ABNORMAL LOW (ref >60)
GFR calc Af Amer: 36 mL/min — ABNORMAL LOW (ref >60)
GFR calc Af Amer: 37 mL/min — ABNORMAL LOW (ref >60)
GFR calc non Af Amer: 24 mL/min — ABNORMAL LOW (ref >60)
GFR calc non Af Amer: 30 mL/min — ABNORMAL LOW (ref >60)
GFR calc non Af Amer: 31 mL/min — ABNORMAL LOW (ref >60)
Glucose, Bld: 106 mg/dL — ABNORMAL HIGH (ref 70–99)
Glucose, Bld: 143 mg/dL — ABNORMAL HIGH (ref 70–99)
Glucose, Bld: 70 mg/dL (ref 70–99)
Potassium: 4.1 mEq/L (ref 3.5–5.1)
Potassium: 4.4 mEq/L (ref 3.5–5.1)
Potassium: 4.4 mEq/L (ref 3.5–5.1)
Sodium: 140 mEq/L (ref 135–145)
Sodium: 142 mEq/L (ref 135–145)
Sodium: 143 mEq/L (ref 135–145)

## 2010-08-01 LAB — CROSSMATCH
ABO/RH(D): O POS
Antibody Screen: NEGATIVE

## 2010-08-01 LAB — COMPREHENSIVE METABOLIC PANEL
ALT: 38 U/L — ABNORMAL HIGH (ref 0–35)
AST: 42 U/L — ABNORMAL HIGH (ref 0–37)
Albumin: 2.7 g/dL — ABNORMAL LOW (ref 3.5–5.2)
Alkaline Phosphatase: 110 U/L (ref 39–117)
BUN: 21 mg/dL (ref 6–23)
CO2: 23 mEq/L (ref 19–32)
Calcium: 8.9 mg/dL (ref 8.4–10.5)
Chloride: 109 mEq/L (ref 96–112)
Creatinine, Ser: 2.54 mg/dL — ABNORMAL HIGH (ref 0.4–1.2)
GFR calc Af Amer: 23 mL/min — ABNORMAL LOW (ref >60)
GFR calc non Af Amer: 19 mL/min — ABNORMAL LOW (ref >60)
Glucose, Bld: 228 mg/dL — ABNORMAL HIGH (ref 70–99)
Potassium: 3.8 mEq/L (ref 3.5–5.1)
Sodium: 139 mEq/L (ref 135–145)
Total Bilirubin: 0.5 mg/dL (ref 0.3–1.2)
Total Protein: 6.4 g/dL (ref 6.0–8.3)

## 2010-08-01 LAB — IRON AND TIBC
Iron: 16 ug/dL — ABNORMAL LOW (ref 42–135)
Saturation Ratios: 9 % — ABNORMAL LOW (ref 20–55)
TIBC: 178 ug/dL — ABNORMAL LOW (ref 250–470)
UIBC: 162 ug/dL

## 2010-08-01 LAB — CULTURE, BLOOD (ROUTINE X 2)
Culture: NO GROWTH
Culture: NO GROWTH

## 2010-08-01 LAB — POCT I-STAT, CHEM 8
BUN: 24 mg/dL — ABNORMAL HIGH (ref 6–23)
Calcium, Ion: 1.19 mmol/L (ref 1.12–1.32)
Chloride: 105 mEq/L (ref 96–112)
Creatinine, Ser: 2.6 mg/dL — ABNORMAL HIGH (ref 0.4–1.2)
Glucose, Bld: 219 mg/dL — ABNORMAL HIGH (ref 70–99)
HCT: 26 % — ABNORMAL LOW (ref 36.0–46.0)
Hemoglobin: 8.8 g/dL — ABNORMAL LOW (ref 12.0–15.0)
Potassium: 3.7 mEq/L (ref 3.5–5.1)
Sodium: 137 mEq/L (ref 135–145)
TCO2: 20 mmol/L (ref 0–100)

## 2010-08-01 LAB — POCT CARDIAC MARKERS
CKMB, poc: <1 ng/mL — ABNORMAL LOW (ref 1.0–8.0)
CKMB, poc: <1 ng/mL — ABNORMAL LOW (ref 1.0–8.0)
Myoglobin, poc: 153 ng/mL (ref 12–200)
Myoglobin, poc: 199 ng/mL (ref 12–200)
Troponin i, poc: <0.05 ng/mL (ref 0.00–0.09)
Troponin i, poc: <0.05 ng/mL (ref 0.00–0.09)

## 2010-08-01 LAB — GLUCOSE, CAPILLARY
Glucose-Capillary: 103 mg/dL — ABNORMAL HIGH (ref 70–99)
Glucose-Capillary: 121 mg/dL — ABNORMAL HIGH (ref 70–99)
Glucose-Capillary: 124 mg/dL — ABNORMAL HIGH (ref 70–99)
Glucose-Capillary: 142 mg/dL — ABNORMAL HIGH (ref 70–99)
Glucose-Capillary: 181 mg/dL — ABNORMAL HIGH (ref 70–99)
Glucose-Capillary: 305 mg/dL — ABNORMAL HIGH (ref 70–99)
Glucose-Capillary: 63 mg/dL — ABNORMAL LOW (ref 70–99)
Glucose-Capillary: 74 mg/dL (ref 70–99)
Glucose-Capillary: 81 mg/dL (ref 70–99)

## 2010-08-01 LAB — SAMPLE TO BLOOD BANK

## 2010-08-01 LAB — CARDIAC PANEL(CRET KIN+CKTOT+MB+TROPI)
CK, MB: 0.8 ng/mL (ref 0.3–4.0)
CK, MB: 0.9 ng/mL (ref 0.3–4.0)
Relative Index: INVALID (ref 0.0–2.5)
Relative Index: INVALID (ref 0.0–2.5)
Total CK: 62 U/L (ref 7–177)
Total CK: 71 U/L (ref 7–177)
Troponin I: 0.02 ng/mL (ref 0.00–0.06)
Troponin I: 0.02 ng/mL (ref 0.00–0.06)

## 2010-08-01 LAB — VITAMIN B12: Vitamin B-12: 406 pg/mL (ref 211–911)

## 2010-08-01 LAB — URINALYSIS, ROUTINE W REFLEX MICROSCOPIC
Bilirubin Urine: NEGATIVE
Glucose, UA: NEGATIVE mg/dL
Hgb urine dipstick: NEGATIVE
Ketones, ur: NEGATIVE mg/dL
Nitrite: NEGATIVE
Protein, ur: NEGATIVE mg/dL
Specific Gravity, Urine: 1.012 (ref 1.005–1.030)
Urobilinogen, UA: 1 mg/dL (ref 0.0–1.0)
pH: 5 (ref 5.0–8.0)

## 2010-08-01 LAB — DIFFERENTIAL
Basophils Absolute: 0 10*3/uL (ref 0.0–0.1)
Basophils Relative: 0 % (ref 0–1)
Eosinophils Absolute: 0.1 10*3/uL (ref 0.0–0.7)
Eosinophils Relative: 1 % (ref 0–5)
Lymphocytes Relative: 8 % — ABNORMAL LOW (ref 12–46)
Lymphs Abs: 0.5 10*3/uL — ABNORMAL LOW (ref 0.7–4.0)
Monocytes Absolute: 0.6 10*3/uL (ref 0.1–1.0)
Monocytes Relative: 10 % (ref 3–12)
Neutro Abs: 5.1 10*3/uL (ref 1.7–7.7)
Neutrophils Relative %: 81 % — ABNORMAL HIGH (ref 43–77)

## 2010-08-01 LAB — LACTIC ACID, PLASMA
Lactic Acid, Venous: 0.5 mmol/L (ref 0.5–2.2)
Lactic Acid, Venous: 2.6 mmol/L — ABNORMAL HIGH (ref 0.5–2.2)
Lactic Acid, Venous: 3 mmol/L — ABNORMAL HIGH (ref 0.5–2.2)

## 2010-08-01 LAB — APTT: aPTT: 37 seconds (ref 24–37)

## 2010-08-01 LAB — TSH
TSH: 0.333 u[IU]/mL — ABNORMAL LOW (ref 0.350–4.500)
TSH: 1.593 u[IU]/mL (ref 0.350–4.500)

## 2010-08-01 LAB — CK TOTAL AND CKMB (NOT AT ARMC)
CK, MB: 0.6 ng/mL (ref 0.3–4.0)
Relative Index: INVALID (ref 0.0–2.5)
Total CK: 62 U/L (ref 7–177)

## 2010-08-01 LAB — LIPID PANEL
Cholesterol: 138 mg/dL (ref 0–200)
HDL: 32 mg/dL — ABNORMAL LOW (ref >39)
LDL Cholesterol: 86 mg/dL (ref 0–99)
Total CHOL/HDL Ratio: 4.3 RATIO
Triglycerides: 101 mg/dL (ref <150)
VLDL: 20 mg/dL (ref 0–40)

## 2010-08-01 LAB — T4, FREE: Free T4: 0.87 ng/dL (ref 0.80–1.80)

## 2010-08-01 LAB — TROPONIN I: Troponin I: 0.01 ng/mL (ref 0.00–0.06)

## 2010-08-01 LAB — RETICULOCYTES
RBC.: 3.13 MIL/uL — ABNORMAL LOW (ref 3.87–5.11)
Retic Count, Absolute: 40.7 10*3/uL (ref 19.0–186.0)
Retic Ct Pct: 1.3 % (ref 0.4–3.1)

## 2010-08-01 LAB — RAPID URINE DRUG SCREEN, HOSP PERFORMED
Amphetamines: NOT DETECTED
Barbiturates: NOT DETECTED
Benzodiazepines: NOT DETECTED
Cocaine: NOT DETECTED
Opiates: NOT DETECTED
Tetrahydrocannabinol: NOT DETECTED

## 2010-08-01 LAB — FOLATE: Folate: 12.2 ng/mL

## 2010-08-01 LAB — FERRITIN: Ferritin: 450 ng/mL — ABNORMAL HIGH (ref 10–291)

## 2010-08-01 LAB — HEMOCCULT GUIAC POC 1CARD (OFFICE): Fecal Occult Bld: NEGATIVE

## 2010-08-01 LAB — HEMOGLOBIN A1C
Hgb A1c MFr Bld: 7.7 % — ABNORMAL HIGH (ref 4.6–6.1)
Mean Plasma Glucose: 174 mg/dL

## 2010-08-01 LAB — CORTISOL: Cortisol, Plasma: 11.9 ug/dL

## 2010-08-01 LAB — CREATININE, URINE, RANDOM: Creatinine, Urine: 84.1 mg/dL

## 2010-08-01 LAB — PROTIME-INR
INR: 1.3 (ref 0.00–1.49)
Prothrombin Time: 16.1 seconds — ABNORMAL HIGH (ref 11.6–15.2)

## 2010-08-01 LAB — MAGNESIUM
Magnesium: 1.7 mg/dL (ref 1.5–2.5)
Magnesium: 2.1 mg/dL (ref 1.5–2.5)

## 2010-08-01 LAB — SODIUM, URINE, RANDOM: Sodium, Ur: 47 mEq/L

## 2010-08-01 LAB — BRAIN NATRIURETIC PEPTIDE: Pro B Natriuretic peptide (BNP): 65 pg/mL (ref 0.0–100.0)

## 2010-08-01 LAB — ABO/RH: ABO/RH(D): O POS

## 2010-08-18 ENCOUNTER — Ambulatory Visit: Payer: Self-pay | Admitting: Internal Medicine

## 2010-09-01 ENCOUNTER — Encounter: Payer: Medicaid Other | Admitting: Internal Medicine

## 2010-09-02 ENCOUNTER — Other Ambulatory Visit: Payer: Self-pay | Admitting: Internal Medicine

## 2010-09-06 NOTE — Discharge Summary (Signed)
Ann [name], Ann [name] NO.:  [id number]   MEDICAL RECORD NO.:  [id number]          PATIENT TYPE:  INP   LOCATION:  [id number]                         FACILITY:  [facility]   PHYSICIAN:  Ann Pellegrini, MD     DATE OF BIRTH:  24-Aug-1950   DATE OF ADMISSION:  09/27/2008  DATE OF DISCHARGE:  09/29/2008                               DISCHARGE SUMMARY   PRIMARY CARE PHYSICIAN:  [facility] in [place], although  the patient is relocating to [place] and we will begin to see her in  the Outpatient Clinic.   The patient's former cardiologist is Dr. Thurston Hole in [place].   DISCHARGE DIAGNOSES:  1. Syncope in the setting of blood pressure of 68/43 upon evaluation      in the emergency department, etiology unknown, but most likely      secondary to left blood pressure medications or arrhythmia, not      seen during 2 days on telemetry.  2. Hypotension.  3. Acute on chronic renal insufficiency  4. Anemia with the presenting hemoglobin of 8.4, increased to 9.9 on      the day of discharge which is close to her baseline.  5. History of asthma, never requiring intubation.  6. History of hypertension, on multiple medications.  7. History of diabetes mellitus with an A1c of 7.7 on this admission.  8. Coronary artery disease, status post 2 stents.   PAST SURGICAL HISTORY:  Cholecystectomy, partial hysterectomy,  appendectomy, and left knee arthroplasty.   DISCHARGE MEDICATIONS:  1. Plavix 75 mg p.o. daily.  2. Aspirin 81 mg p.o. daily.  3. Claritin 10 mg p.o. daily.  4. Glimepiride 4 mg p.o. daily.  5. Protonix 20 mg p.o. daily.  6. Crestor 40 mg p.o. daily.  7. Lantus 20 units nightly.  8. Ventolin albuterol inhaler as directed.  9. Senokot-S 1 tablet daily for constipation.   Please note that the only new medication was the Senokot and a  prescription was provided for this.  Please also note that the other 4  blood pressure medications that the patient had been  taking prior to  admission were all held upon discharge.  These include Avalide 300/25,  verapamil 240, Tekturna 300 daily, and Imdur b.i.d.  The patient's blood  pressure will be evaluated in the Outpatient Clinic and these will be  titrated back up.   CONDITION ON DISCHARGE:  The patient was monitored on telemetry for  nearly 48 hours and did not have any events or arrhythmias.  The patient  only displayed mild dizziness during hospitalization and was held off  all of her blood pressure medications and her blood pressure remained in  the 128-144 range except for one elevated reading on the night prior to  discharge.  The patient's discharge blood pressure was 131/80.  The  patient should be followed up in the Outpatient Clinic and has an  appointment scheduled with Dr. Trinna Post at 3 p.m. on October 06, 2008.  Please assess the patient's blood pressure at that time and we would  like the first medication for  her to be started back on, it would be the  Avalide given her chronic renal insufficiency.   CONSULTATIONS:  None.   IMAGING:  1. Two-D echo performed on September 28, 2008, with the following      impression:  LVEF 55-60% and grade 1 diastolic dysfunction.  Mild      diffuse sclerosis without stenosis of the aortic valve and mild      mitral valve regurgitation.  2. Carotid Dopplers with the preliminary report of no ICA stenosis.      Final report pending.  3. EEG, final transcript pending but we were not notified within 24      hours, making it likely that it will come back normal.  4. Chest x-ray on September 27, 2008, with no acute cardiopulmonary      findings.  5. Renal ultrasound with following impression:  Mild renal cortical      thinning and slight increased echogenicity, may suggest medical      renal disease.  No hydronephrosis.   HISTORY OF PRESENT ILLNESS:  The patient is a 60 year old with coronary  artery disease, status post 2 stents around 5 years ago and no known  stress  or catheterization since, hypertension, hyperlipidemia, and  diabetes, who presents after passing out a few hours prior to being  seen.  The patient had been lying on side and rose to a seated position.  She became dizzy with the room spinning around her, then fell her heart  pounding, then passed out.  Her daughter came to her side and said she  had passed out for about 5-10 minutes.  She called EMS and her initial  CBG was around the 200s.  She displayed no incontinence, no tongue  biting, no seizure activity, but her eyes did roll back in her head.  After waking, the patient felt generalized weakness, but nothing focal.  She had no chest pain.  Mild shortness of breath chronically, perhaps  mildly worse recently.  The patient reports one other episodes like this  a few years ago where she has similar presentation, was hospitalized and  had a negative workup and diagnosed with inner ear pathology.  The  patient has had subjective fevers and chills lately and new mild dry  cough since the day prior to admission.   PHYSICAL EXAMINATION:  ADMISSION VITAL SIGNS:  Temperature 99.1.  Blood  pressure 68/43 in the ED that rose to 96/41 with normal saline bolus.  Pulse is 71.  Respiratory rate of 20.  O2 sat of 100% on room air.  GENERAL:  No apparent distress.  EYES:  EOMI, anicteric.  ENT:  Pink oropharynx.  Mallampati 4.  NECK:  Supple.  No bruits.  RESPIRATORY:  Clear to auscultation bilaterally.  Distant heart sounds  secondary to body habitus.  CARDIOVASCULAR:  Regular rate and rhythm.  No murmurs, rubs, or gallops.  Distant heart sounds again secondary to body habitus.  GI:  Obese, soft, mild tender to palpation in the lower abdomen.  GU:  No CVA tenderness.  MUSCULOSKELETAL:  No deformities.  NEURO:  Cranial nerves II-XII intact, alert and oriented, 5/5 strength  diffusely.  Cerebellar function intact.  PSYCH:  Appropriate.   ADMISSION LABORATORY DATA:  Sodium 139, potassium 3.8,  chloride of 109,  bicarbonate 23, BUN of 21, and creatinine 2.54.  Please note that the  patient's creatinine at [facility] in July 2009 was around  1.6, so this is elevated from her baseline.  Glucose is 228.  White  blood cell count 6.3, hemoglobin 8.4, platelets of 177, MCV of 81.7 with  an RDW of 17.0.  Anemia panel shows reticulocyte count of 1.3%, ferritin  of 450, TIBC of 178, folate of 12.2, B12 of 406.   INR of 1.3, PT of 16.1, and PTT of 37.  Point-of-care cardiac enzymes  were negative and 3 sets were drawn, all of which were negative.  The  patient did have a elevated lactic acid of 3.0 in the setting of  hypertension.  The patient's lactic acid did stabilize and was 0.5 on  discharge.   FOBT negative.  Cortisol 11.0.   HOSPITAL COURSE:  1. Syncope in the setting of hypotension:  The patient was seen in the      ED after being transported by EMS after passing out for 5-10      minutes.  In the ED, her blood pressure was only 68/43.  She was      hydrated with normal saline boluses and her blood pressure      increased into the systolic [id number].  She was hospitalized on      telemetry and did not show any ectopic activity.  She does have a      history of coronary artery disease, so an echo was obtained which      showed good systolic function with an LVEF of 55-60%.  There was      mild diastolic dysfunction seen.  She also had a carotid Doppler      and the preliminary reports showed no ICA stenosis, although the      final reports are currently pending.  The patient also had an EEG      given that she had 5-10 minutes of time down.  The EEG report is      currently pending.  Other potential etiologies of the patient's      syncope include overmedication from the multiple antihypertensives      that the patient was on.  The patient's blood pressure medications      were held and her blood pressure was in the A999333 range systolic.      There was 1 outlier  systolic blood pressure of 178.  However, it      was decided that upon discharge, to continue holding all of her      antihypertensives and to follow her closely in the clinic and she      has a followup appointment in around 1 week.  Other etiologies for      the patient's syncope may include vasovagal as the patient was      coughing prior to the incident as well as neurogenic, but this is      less likely given that there were no focal neurologic symptoms and      the patient did not have any signs or symptoms of seizure.  Please      note that the patient did have an elevated lactic acid as high as      3.0 upon admission.  This is likely secondary to tissue ischemia      secondary to the patient's hypotension and syncope.  The patient's      elevated lactic acid level resolved within one day and the patient      never had an anion gap on her BMET.  2. Hypertension:  As above, the patient's blood pressure medications      were  held upon discharge and should be followed up in the      Outpatient Clinic.  3. Diabetes mellitus:  The patient's hemoglobin A1c is 7.7.  Her blood      sugars were checked t.i.d. q.a.c. and they were generally good in      the 60-120 range.  This was on her home dose of Lantus 20 units      nightly.  She will be continued on this as an outpatient.  4. Anemia:  The patient had her last CBC in the outpatient setting      with a hemoglobin of 10.0 in July 2009.  Her initial presenting      hemoglobin was only 8.1.  An FOBT was checked which was negative      for any occult blood.  The patient's hemoglobin increased during      hospitalization to a discharge of 9.9 close to her baseline.  The      patient did have anemia panel which showed a normal ferritin and      normal B12.  It is possible that the patient suffers from anemia of      chronic disease and should be followed up in the outpatient      setting.  5. Acute on chronic renal insufficiency:  The  patient had a last      outpatient creatinine of 1.6 in July 2009.  The creatinine on      admission was around 2.5.  It is likely with the hypoperfusion of      her kidneys in the setting of hypotension that this resulted in      acute renal insufficiency.  Upon hydration, her creatinine improved      to a discharge creatinine of 1.73 which is close to her baseline.      A renal ultrasound was obtained which showed increased echogenicity      consistent with medical renal disease.   DISCHARGE VITAL SIGNS:  Temperature 98.3, blood pressure 131/80, O2 sat  of 100% on room air, and pulse of 83.   DISCHARGE LABORATORY DATA:  Sodium 140, potassium 4.4, chloride of 110,  bicarbonate 22, glucose of 143, BUN of 16, creatinine 1.73, and calcium  of 8.8.  White blood cell count of 3.9, hemoglobin 9.9, and platelets of  195.   PENDING LABORATORY DATA:  Please follow up on the final report for the  carotid Dopplers as well as the EEG.      Ann Russian, MD  Electronically Signed      Ann Pellegrini, MD     LW/MEDQ  D:  09/29/2008  T:  09/30/2008  Job:  [id number]

## 2010-09-06 NOTE — Procedures (Signed)
REQUESTING PHYSICIAN:  Felicity Pellegrini, MD   EEG NUMBER:  [id number]   CLINICAL HISTORY:  A 60 year old woman admitted for hypotension and  syncope.  EEG is performed for evaluation of seizure.  The patient  described as awake and drowsy.  This is a routine EEG done with photic  stimulation and hyperventilation.   DESCRIPTION:  The dominant rhythm in this tracing is a moderate  amplitude alpha rhythm of 10 Hz, which predominates posteriorly, appears  without abnormal asymmetry, and attenuates with eye opening and closing.  Low-amplitude fast activity seen frontally and centrally and appears  without abnormal asymmetry.  No focal slowing is noted and no  epileptiform discharges are seen.  Drowsiness occurs naturally, as  evidenced by fragmentation of the background and generalized attenuation  of rhythms.  No architecture stage II sleep noted.  Photic stimulation  produced symmetric driving responses best seen at 11 Hz.  Hyperventilation produced a lot of muscle artifact, but some increased  prominence of the background alpha, without appearance of focal  abnormality.  Single-channel devoted EKG revealed sinus rhythm  throughout with a rate of approximately 84 beats per minute.   CONCLUSION:  Normal study in awake and drowsy states.      Michael L. Doy Mince, M.D.  Electronically Signed     [id number]  D:  09/28/2008 17:06:34  T:  09/29/2008 11:36:21  Job #:  [id number]

## 2010-09-08 ENCOUNTER — Other Ambulatory Visit: Payer: Self-pay | Admitting: *Deleted

## 2010-09-08 DIAGNOSIS — E109 Type 1 diabetes mellitus without complications: Secondary | ICD-10-CM

## 2010-09-08 MED ORDER — INSULIN GLARGINE 100 UNIT/ML ~~LOC~~ SOLN: 65.0000 [IU] | Freq: Every day | SUBCUTANEOUS | Status: DC

## 2010-09-09 ENCOUNTER — Other Ambulatory Visit: Payer: Self-pay | Admitting: *Deleted

## 2010-09-09 MED ORDER — INSULIN PEN NEEDLE 31G X 8 MM MISC: Status: DC

## 2010-09-09 MED ORDER — ACCU-CHEK MULTICLIX LANCETS MISC: Status: DC

## 2010-09-09 MED ORDER — GLUCOSE BLOOD VI STRP: ORAL_STRIP | Status: DC

## 2010-12-05 ENCOUNTER — Ambulatory Visit (INDEPENDENT_AMBULATORY_CARE_PROVIDER_SITE_OTHER): Payer: Medicaid Other | Admitting: Internal Medicine

## 2010-12-05 ENCOUNTER — Encounter: Payer: Self-pay | Admitting: Internal Medicine

## 2010-12-05 VITALS — BP 185/90 | HR 85 | Temp 97.8°F | Ht 62.0 in | Wt 251.5 lb

## 2010-12-05 DIAGNOSIS — E109 Type 1 diabetes mellitus without complications: Secondary | ICD-10-CM

## 2010-12-05 DIAGNOSIS — I10 Essential (primary) hypertension: Secondary | ICD-10-CM

## 2010-12-05 DIAGNOSIS — E785 Hyperlipidemia, unspecified: Secondary | ICD-10-CM

## 2010-12-05 DIAGNOSIS — M791 Myalgia, unspecified site: Secondary | ICD-10-CM

## 2010-12-05 DIAGNOSIS — IMO0001 Reserved for inherently not codable concepts without codable children: Secondary | ICD-10-CM

## 2010-12-05 DIAGNOSIS — D649 Anemia, unspecified: Secondary | ICD-10-CM

## 2010-12-05 DIAGNOSIS — E119 Type 2 diabetes mellitus without complications: Secondary | ICD-10-CM

## 2010-12-05 DIAGNOSIS — N183 Chronic kidney disease, stage 3 unspecified: Secondary | ICD-10-CM

## 2010-12-05 LAB — CBC
HCT: 32.5 % — ABNORMAL LOW (ref 36.0–46.0)
Hemoglobin: 10.5 g/dL — ABNORMAL LOW (ref 12.0–15.0)
MCH: 27.5 pg (ref 26.0–34.0)
MCHC: 32.3 g/dL (ref 30.0–36.0)
MCV: 85.1 fL (ref 78.0–100.0)
Platelets: 208 10*3/uL (ref 150–400)
RBC: 3.82 MIL/uL — ABNORMAL LOW (ref 3.87–5.11)
RDW: 14.5 % (ref 11.5–15.5)
WBC: 7.5 10*3/uL (ref 4.0–10.5)

## 2010-12-05 LAB — TSH: TSH: 0.38 u[IU]/mL (ref 0.350–4.500)

## 2010-12-05 LAB — COMPREHENSIVE METABOLIC PANEL
ALT: 9 U/L (ref 0–35)
AST: 12 U/L (ref 0–37)
Albumin: 3.5 g/dL (ref 3.5–5.2)
Alkaline Phosphatase: 68 U/L (ref 39–117)
BUN: 27 mg/dL — ABNORMAL HIGH (ref 6–23)
CO2: 27 mEq/L (ref 19–32)
Calcium: 8.8 mg/dL (ref 8.4–10.5)
Chloride: 104 mEq/L (ref 96–112)
Creat: 1.65 mg/dL — ABNORMAL HIGH (ref 0.50–1.10)
Glucose, Bld: 108 mg/dL — ABNORMAL HIGH (ref 70–99)
Potassium: 4.2 mEq/L (ref 3.5–5.3)
Sodium: 139 mEq/L (ref 135–145)
Total Bilirubin: 0.4 mg/dL (ref 0.3–1.2)
Total Protein: 6.9 g/dL (ref 6.0–8.3)

## 2010-12-05 LAB — LIPID PANEL
Cholesterol: 145 mg/dL (ref 0–200)
HDL: 34 mg/dL — ABNORMAL LOW (ref >39)
LDL Cholesterol: 80 mg/dL (ref 0–99)
Total CHOL/HDL Ratio: 4.3 Ratio
Triglycerides: 154 mg/dL — ABNORMAL HIGH (ref <150)
VLDL: 31 mg/dL (ref 0–40)

## 2010-12-05 LAB — GLUCOSE, CAPILLARY: Glucose-Capillary: 135 mg/dL — ABNORMAL HIGH (ref 70–99)

## 2010-12-05 LAB — POCT GLYCOSYLATED HEMOGLOBIN (HGB A1C): Hemoglobin A1C: 8.8

## 2010-12-05 NOTE — Progress Notes (Signed)
  Subjective:    Patient ID: Ann [name], female    DOB: 09-29-1950, 61 y.o.   MRN: [id number]  HPI  Patient is 5ar-old female with past medical history outlined below who presents to clinic for regular followup on her blood pressure, cholesterol, diabetes. She reports compliance with her medications as well as recommended diet and exercise. She reports losing 10 pounds. She denies recent sicknesses for hospitalizations, no episodes of chest pain or shortness of breath, no abdominal or urinary concerns, no systemic symptoms. She denies weakness or headaches or visual changes. She would like to have a referral to sports medicine clinic for further management of her chronic shoulder, knee, neck pain.  Review of Systems Constitutional: Denies fever, chills, diaphoresis, appetite change and fatigue.  HEENT: Denies photophobia, eye pain, redness, hearing loss, ear pain, congestion, sore throat, rhinorrhea, sneezing, mouth sores, trouble swallowing, neck pain, neck stiffness and tinnitus.   Respiratory: Denies SOB, DOE, cough, chest tightness,  and wheezing.   Cardiovascular: Denies chest pain, palpitations and leg swelling.  Gastrointestinal: Denies nausea, vomiting, abdominal pain, diarrhea, constipation, blood in stool and abdominal distention.  Genitourinary: Denies dysuria, urgency, frequency, hematuria, flank pain and difficulty urinating.  Musculoskeletal: Denies myalgias, back pain, joint swelling, arthralgias and gait problem.  Skin: Denies pallor, rash and wound.  Neurological: Denies dizziness, seizures, syncope, weakness, light-headedness, numbness and headaches.  Hematological: Denies adenopathy. Easy bruising, personal or family bleeding history  Psychiatric/Behavioral: Denies suicidal ideation, mood changes, confusion, nervousness, sleep disturbance and agitation      Objective:   Physical Exam  Constitutional: Vital signs reviewed.  Patient is a well-developed and well-nourished in  no acute distress and cooperative with exam. Alert and oriented x3.  Cardiovascular: RRR, S1 normal, S2 normal, no MRG, pulses symmetric and intact bilaterally Pulmonary/Chest: CTAB, no wheezes, rales, or rhonchi Abdominal: Soft. Non-tender, non-distended, bowel sounds are normal, no masses, organomegaly, or guarding present.  Neurological: A&O x3, Strenght is normal and symmetric bilaterally, cranial nerve II-XII are grossly intact, no focal motor deficit, sensory intact to light touch bilaterally.          Assessment & Plan:

## 2010-12-05 NOTE — Assessment & Plan Note (Signed)
A1c down from 11-8.8. Patient reports compliance with insulin and recommended diet and exercise. I have encouraged compliance and we have discussed goal A1c of 7. We have also discussed goal LDL of less than 100. I have examined patient's feet today and be exam findings are within normal limits. Patient is up-to-date on yearly ophthalmology screening. No changes to her medication regimen will be made today.

## 2010-12-05 NOTE — Assessment & Plan Note (Signed)
We will check fasting lipid panel today and readjust medication regimen if indicated. We'll also check liver function tests.

## 2010-12-05 NOTE — Assessment & Plan Note (Signed)
Blood pressure above goal. We will increase her Vicodin to 10 mg once daily. I have encouraged compliance with medications as well as diet and exercise. We will provide refills on her medications today. We'll also check electrolyte panel. Patient was advised to call clinic back if her numbers are persistently higher than 150/90.

## 2010-12-05 NOTE — Assessment & Plan Note (Signed)
We'll check electrolyte panel.

## 2010-12-06 ENCOUNTER — Other Ambulatory Visit: Payer: Self-pay | Admitting: Internal Medicine

## 2010-12-13 ENCOUNTER — Ambulatory Visit (INDEPENDENT_AMBULATORY_CARE_PROVIDER_SITE_OTHER): Payer: Medicaid Other | Admitting: Family Medicine

## 2010-12-13 ENCOUNTER — Encounter: Payer: Self-pay | Admitting: Family Medicine

## 2010-12-13 VITALS — BP 124/76 | HR 79 | Ht 62.0 in | Wt 251.0 lb

## 2010-12-13 DIAGNOSIS — M546 Pain in thoracic spine: Secondary | ICD-10-CM

## 2010-12-13 MED ORDER — CYCLOBENZAPRINE HCL 5 MG PO TABS: ORAL_TABLET | ORAL | Status: DC

## 2010-12-13 NOTE — Progress Notes (Signed)
  Subjective:    Patient ID: Ann [name], female    DOB: 06-10-1950, 60 y.o.   MRN: [id number]  HPI  Several weeks of left upper back pain. Does not recall specific injury. It hurts a lot when she picks up her 12 pound grandchild. Also hurts if she does a lot of work with her left arm. Not having any numbness in her arm or hand. Pain is centered in her back with radiation across the left scapula. Pain is 4-6/10 and it hurts more at night. It is relieved somewhat by rest. It is worsened by standing long-term or doing a lot of work with her upper extremity.  Review of Systems    denies unusual weight change recently, denies fever Objective:   Physical Exam GENERAL: Well-developed obese female no acute distress Bilateral shoulder have full range of motion. All rotator cuff muscles have intact strength. Back: Tender to palpation at the medial border of the left scapula down to the tip of the trapezius. She has normal scapular motion that is symmetrical.       Assessment & Plan:  Thoracic back pain related to leg muscles and body habitus specifically macromastia. I recommended physical therapy for her LC her back in 4 weeks.

## 2010-12-20 ENCOUNTER — Ambulatory Visit: Payer: Medicaid Other | Attending: Family Medicine | Admitting: Rehabilitation

## 2010-12-20 DIAGNOSIS — IMO0001 Reserved for inherently not codable concepts without codable children: Secondary | ICD-10-CM | POA: Insufficient documentation

## 2010-12-20 DIAGNOSIS — M546 Pain in thoracic spine: Secondary | ICD-10-CM | POA: Insufficient documentation

## 2011-01-03 ENCOUNTER — Other Ambulatory Visit: Payer: Self-pay | Admitting: *Deleted

## 2011-01-03 DIAGNOSIS — I10 Essential (primary) hypertension: Secondary | ICD-10-CM

## 2011-01-03 MED ORDER — AMLODIPINE BESYLATE 10 MG PO TABS: 10.0000 mg | ORAL_TABLET | Freq: Every day | ORAL | Status: DC

## 2011-01-04 ENCOUNTER — Ambulatory Visit: Payer: Medicaid Other | Attending: Family Medicine | Admitting: Rehabilitation

## 2011-01-04 DIAGNOSIS — M546 Pain in thoracic spine: Secondary | ICD-10-CM | POA: Insufficient documentation

## 2011-01-04 DIAGNOSIS — IMO0001 Reserved for inherently not codable concepts without codable children: Secondary | ICD-10-CM | POA: Insufficient documentation

## 2011-01-11 ENCOUNTER — Ambulatory Visit: Payer: Medicaid Other

## 2011-01-13 ENCOUNTER — Ambulatory Visit: Payer: Medicaid Other | Admitting: Family Medicine

## 2011-01-18 ENCOUNTER — Ambulatory Visit: Payer: Medicaid Other | Admitting: Rehabilitation

## 2011-01-20 ENCOUNTER — Ambulatory Visit (INDEPENDENT_AMBULATORY_CARE_PROVIDER_SITE_OTHER): Payer: Medicaid Other | Admitting: Family Medicine

## 2011-01-20 ENCOUNTER — Encounter: Payer: Self-pay | Admitting: Family Medicine

## 2011-01-20 VITALS — BP 133/79 | HR 74

## 2011-01-20 DIAGNOSIS — M25561 Pain in right knee: Secondary | ICD-10-CM

## 2011-01-20 DIAGNOSIS — IMO0001 Reserved for inherently not codable concepts without codable children: Secondary | ICD-10-CM

## 2011-01-20 DIAGNOSIS — M25569 Pain in unspecified knee: Secondary | ICD-10-CM

## 2011-01-20 DIAGNOSIS — M791 Myalgia, unspecified site: Secondary | ICD-10-CM

## 2011-01-20 NOTE — Progress Notes (Signed)
  Subjective:    Patient ID: Ann [name], female    DOB: 1950-10-18, 60 y.o.   MRN: [id number]  HPI #1. Followup upper back pain. She has completed physical therapy except for 2 sessions. She is 100% resolved and extremely happy about that.  #2. Right knee pain. Similar to the pain she had in her left knee before she had total knee replacement done about 5 years ago. Pain is centrally located in the knee, does not radiate. At this time is not awakening her from sleep but sometimes keeps her awake when she tries to go to sleep. Pain can be 8-9/10 on bad days and to 3/10 on good days. Worse with climbing stairs or doing a lot of walking. Occasionally get sharp pinch sensation if she steps up. The knee has not given way but it feels like it could. PERTINENT  PMH / PSH: Left total knee replacement done in [facility]. At that time she had x-rays done at both knees and was told she had some moderate arthritis in the right knee. For the left knee she had corticosteroid injection trials which helped for a while, and she had visco- supplementation which did not seem to help.  Review of Systems Denies fever or, denies unusual weight change.    Objective:   Physical Exam  GENERAL: Well-developed female. Overweight. BACK. Upper. Nontender to palpation along the trapezius area. KNEES: Left knee full extension and flexion. Well-healed midline vertical scar. Right knee: There to palpation along the medial joint line especially anteriorly. The calf is soft the popliteal space is benign. There is some tenderness along the distal portion of the medial hamstring at its insertion site. Ligamentously intact to varus and average stress. Normal Lachman. No effusion. No erythema. INJECTION: Patient was given informed consent, signed copy in the chart. Appropriate time out was taken. Area prepped and draped in usual sterile fashion. One cc of kenalog plus  for cc of lidocaine was injected into the  right knee using a(n) anterior approach. The patient tolerated the procedure well. There were no complications. Post procedure instructions were given.      Assessment & Plan:  #1. Upper back pain totally resolved. I congratulated her on her work with physical therapy. I would recommend she continue the exercises at least 3 days a week. For flareup of her chart exercises 5-6 days a week.  #2. Osteoarthritis knees. Status post left TKR. We discussed options. Most likely she has advancing arthritis in the right knee. I'll order x-rays. We decided to ahead and move forward with corticosteroid injection. I will see her back in 2-3 weeks.

## 2011-01-27 ENCOUNTER — Ambulatory Visit
Admission: RE | Admit: 2011-01-27 | Discharge: 2011-01-27 | Disposition: A | Discharge status: Home / Self Care | Payer: Medicaid Other | Source: Ambulatory Visit | Attending: Family Medicine | Admitting: Family Medicine

## 2011-01-27 DIAGNOSIS — M25561 Pain in right knee: Secondary | ICD-10-CM

## 2011-01-31 ENCOUNTER — Encounter: Payer: Self-pay | Admitting: Family Medicine

## 2011-02-10 ENCOUNTER — Ambulatory Visit (INDEPENDENT_AMBULATORY_CARE_PROVIDER_SITE_OTHER): Payer: Medicaid Other | Admitting: Family Medicine

## 2011-02-10 ENCOUNTER — Encounter: Payer: Self-pay | Admitting: Family Medicine

## 2011-02-10 VITALS — BP 108/67 | HR 66

## 2011-02-10 DIAGNOSIS — IMO0002 Reserved for concepts with insufficient information to code with codable children: Secondary | ICD-10-CM

## 2011-02-10 DIAGNOSIS — M1710 Unilateral primary osteoarthritis, unspecified knee: Secondary | ICD-10-CM

## 2011-02-10 DIAGNOSIS — M545 Low back pain, unspecified: Secondary | ICD-10-CM

## 2011-02-10 DIAGNOSIS — M1711 Unilateral primary osteoarthritis, right knee: Secondary | ICD-10-CM

## 2011-02-10 NOTE — Progress Notes (Signed)
Subjective:    Patient ID: Ann [name], female    DOB: 04/08/1951, 60 y.o.   MRN: [id number]  HPI #1. Followup right knee pain. The shot of corticosteroid improve her pain from an 8/10 to about a 2/10. She is also here to followup x-rays.  #2. Having some low back pain. Wonders if this could be related to the problems with her knees. Has been doing some upper back rehabilitation at home. Low back pain does not radiate below her upper buttock's. She's not having a leg weakness. She denies urinary or fecal incontinence. The pain is 2-6/10. Worse after long periods of standing. Relieved after rest.  PERTINENT  PMH / PSH: History of left total knee replacement done several years ago in [place]. Dr. Jannifer Franklin stated that. She has since moved here. Diabetes mellitus.  PCP: Dr Vertell Limber , [facility].  Patient Active Problem List  Diagnoses  . DIABETES MELLITUS, INSULIN DEPENDENT (IDDM)  . DYSLIPIDEMIA  . UNSPECIFIED ANEMIA  . HYPERTENSION  . CAD  . ASTHMA, UNSPECIFIED, UNSPECIFIED STATUS  . CHRONIC KIDNEY DISEASE STAGE III (MODERATE)  . Myalgia  . Thoracic spine pain  . Knee pain, right  . Right knee DJD   Current Outpatient Prescriptions on File Prior to Visit  Medication Sig Dispense Refill  . amLODipine (NORVASC) 10 MG tablet Take 1 tablet (10 mg total) by mouth daily.  30 tablet  3  . aspirin 81 MG tablet Take 81 mg by mouth daily.        . cyclobenzaprine (FLEXERIL) 5 MG tablet Take by moth 1/2 or 1 tab at night prn back spasm  30 tablet  1  . glucose blood (ACCU-CHEK AVIVA) test strip Use as instructed  100 each  12  . insulin glargine (LANTUS SOLOSTAR) 100 UNIT/ML injection Inject 65 Units into the skin at bedtime.  10 mL  6  . Insulin Pen Needle (EASY TOUCH PEN NEEDLES) 31G X 8 MM MISC Use to inject insulin as instructed.  100 each  3  . isosorbide dinitrate (ISORDIL) 30 MG tablet Take 30 mg by mouth daily.        . Lancets (ACCU-CHEK MULTICLIX)  lancets Use as instructed  100 each  12  . lisinopril-hydrochlorothiazide (PRINZIDE,ZESTORETIC) 20-12.5 MG per tablet TAKE 2 TABLETS BY MOUTH EVERY DAY  60 tablet  PRN  . loratadine (CLARITIN) 10 MG tablet Take 10 mg by mouth daily.        . metoprolol (LOPRESSOR) 50 MG tablet TAKE 1 TABLET BY MOUTH TWICE DAILY  60 tablet  PRN  . niacin (NIASPAN) 500 MG CR tablet Take 2 tablets (1,000 mg total) by mouth at bedtime.  60 tablet  3  . nitroGLYCERIN (NITROSTAT) 0.4 MG SL tablet Place 0.4 mg under the tongue every 5 (five) minutes as needed. As needed for chest pain up to 3 doses       . pantoprazole (PROTONIX) 20 MG tablet TAKE 1 TABLET BY MOUTH DAILY  30 tablet  PRN  . PLAVIX 75 MG tablet TAKE 1 TABLET BY MOUTH DAILY  30 tablet  PRN  . pravastatin (PRAVACHOL) 40 MG tablet Take 1 tablet (40 mg total) by mouth every evening.  30 tablet  11  . sennosides-docusate sodium (SENOKOT-S) 8.6-50 MG tablet Take 1 tablet by mouth as needed. For constipation       . VENTOLIN HFA 108 (90 BASE) MCG/ACT inhaler INHALE ONE TO TWO PUFFS BY MOUTH EVERY 4 HOURS AS  NEEDED FOR SHORTNESS OF BREATH  1 Inhaler  3    X-rays right knee. Unfortunately these were not done standing as I had requested. There is medial shift, sclerosis and bone-on-bone at the medial compartment.  XRAY OFFICIAL REPORT:Clinical Data: Chronic knee pain with increased popping and  locking. History of contralateral total knee replacement.  RIGHT KNEE - 3 VIEW  Comparison: None.  Findings: Tricompartmental degenerative changes are most advanced  in the medial compartment where there is moderate loss of joint  space. There is mild patellar spurring. No acute fracture,  dislocation or definite intra-articular loose body is identified.  There is no significant knee joint effusion. There are mild  diffuse vascular calcifications.  IMPRESSION:  Moderate tricompartmental degenerative changes. No acute osseous  findings.   Review of Systems   ROS:   denies fever, sweats, chills. Has had no unusual weight change. Objective:   Physical Exam Vital signs reviewed. GENERAL: Well developed, well nourished, no acute distress. Overweight. KNEES: Right knee full flexion and extension. Medial joint line tenderness. No effusion. No erythema. Ligaments intact. Left knee has a well-healed midline scar. Full extension, full flexion. Bilaterally her calves are soft and distally she is neurovascular intact. BACK: Tender to palpation over the PSIS and the SI joint bilaterally. She also has muscle spasm in the lumbar muscles. The lumbar vertebra are nontender to percussion. Flexing at the hips greater than 90 causes increase in her pain from a pulling sensation bilaterally in the muscles. She can hyperextend at the waist only about 10 before she has cramping in the lumbar muscles. Straight leg raise negative bilaterally. Lower extremity strength 5 out of 5.       Assessment & Plan:  #1. Right knee pain with DJD. History of left total knee replacement. Her right knee pain improved significantly with corticosteroid injection. She would like to go ahead and pursue getting a total knee replacement on that side. Dr. Jannifer Franklin did her previous knee [place] city. We will set her up with orthopedic surgeon here and she now lives in [place]. #2. Low back pain. We gave her handout on low back exercises. Offered her physical therapy but she wants to try it at home. She will followup when necessary.

## 2011-02-10 NOTE — Patient Instructions (Signed)
You have been scheduled for an appointment at [facility] for 02/20/11 at 12:30 for a consultation for a right total knee replacement. Their office is located at [address] [place], and their phone number is [phone].

## 2011-02-21 ENCOUNTER — Encounter: Payer: Self-pay | Admitting: Internal Medicine

## 2011-02-21 ENCOUNTER — Ambulatory Visit (INDEPENDENT_AMBULATORY_CARE_PROVIDER_SITE_OTHER): Payer: Medicaid Other | Admitting: Internal Medicine

## 2011-02-21 VITALS — BP 158/88 | HR 79 | Temp 98.3°F | Ht 62.0 in | Wt 246.3 lb

## 2011-02-21 DIAGNOSIS — I2510 Atherosclerotic heart disease of native coronary artery without angina pectoris: Secondary | ICD-10-CM

## 2011-02-21 DIAGNOSIS — D649 Anemia, unspecified: Secondary | ICD-10-CM

## 2011-02-21 DIAGNOSIS — Z23 Encounter for immunization: Secondary | ICD-10-CM

## 2011-02-21 DIAGNOSIS — E109 Type 1 diabetes mellitus without complications: Secondary | ICD-10-CM

## 2011-02-21 DIAGNOSIS — I10 Essential (primary) hypertension: Secondary | ICD-10-CM

## 2011-02-21 DIAGNOSIS — N183 Chronic kidney disease, stage 3 unspecified: Secondary | ICD-10-CM

## 2011-02-21 DIAGNOSIS — E119 Type 2 diabetes mellitus without complications: Secondary | ICD-10-CM

## 2011-02-21 LAB — CBC
HCT: 32.9 % — ABNORMAL LOW (ref 36.0–46.0)
Hemoglobin: 10.4 g/dL — ABNORMAL LOW (ref 12.0–15.0)
MCH: 27.2 pg (ref 26.0–34.0)
MCHC: 31.6 g/dL (ref 30.0–36.0)
MCV: 86.1 fL (ref 78.0–100.0)
Platelets: 209 10*3/uL (ref 150–400)
RBC: 3.82 MIL/uL — ABNORMAL LOW (ref 3.87–5.11)
RDW: 14.8 % (ref 11.5–15.5)
WBC: 6 10*3/uL (ref 4.0–10.5)

## 2011-02-21 LAB — GLUCOSE, CAPILLARY: Glucose-Capillary: 80 mg/dL (ref 70–99)

## 2011-02-21 LAB — IRON AND TIBC
%SAT: 25 % (ref 20–55)
Iron: 60 ug/dL (ref 42–145)
TIBC: 240 ug/dL — ABNORMAL LOW (ref 250–470)
UIBC: 180 ug/dL (ref 125–400)

## 2011-02-21 LAB — FERRITIN: Ferritin: 260 ng/mL (ref 10–291)

## 2011-02-21 LAB — BASIC METABOLIC PANEL
BUN: 27 mg/dL — ABNORMAL HIGH (ref 6–23)
CO2: 26 mEq/L (ref 19–32)
Calcium: 9.1 mg/dL (ref 8.4–10.5)
Chloride: 106 mEq/L (ref 96–112)
Creat: 1.83 mg/dL — ABNORMAL HIGH (ref 0.50–1.10)
Glucose, Bld: 69 mg/dL — ABNORMAL LOW (ref 70–99)
Potassium: 4.4 mEq/L (ref 3.5–5.3)
Sodium: 141 mEq/L (ref 135–145)

## 2011-02-21 LAB — POCT GLYCOSYLATED HEMOGLOBIN (HGB A1C): Hemoglobin A1C: 8.4

## 2011-02-21 MED ORDER — INSULIN GLARGINE 100 UNIT/ML ~~LOC~~ SOLN: 70.0000 [IU] | Freq: Every day | SUBCUTANEOUS | Status: DC

## 2011-02-21 NOTE — Patient Instructions (Signed)
Please stop taking Plavix, as he no longer needed continue taking Aspirin 81mg  daily  Please increase Lantus to 70 units daily

## 2011-02-21 NOTE — Assessment & Plan Note (Signed)
Patient's blood pressure slightly above goal when initially checked, however after the patient was sitting in the room for several minutes I rechecked it personally and her blood pressure at that time was 136/80. Patient reports compliance with all of her medications. Therefore we'll not make any further changes to her blood pressure regimen

## 2011-02-21 NOTE — Progress Notes (Signed)
  Subjective:    Patient ID: Ann [name], female    DOB: Apr 13, 1951, 60 y.o.   MRN: [id number]  HPI  Patient is 60 year-old female with past medical history outlined below who presents to clinic for regular followup on her blood pressure, cholesterol, diabetes. She reports compliance with her medications as well as recommended diet and exercise. She reports losing more than 10 pounds. She denies recent sicknesses for hospitalizations, no episodes of chest pain or shortness of breath, no abdominal or urinary concerns, no systemic symptoms. She denies weakness or headaches or visual changes. She is following up with sports medicine clinic for further management of her chronic shoulder, knee, neck pain, and is scheduled for followup with [facility] orthopedics early November for possible total right knee replacement due to severe degenerative disease.  Review of Systems  Musculoskeletal: Positive for back pain and arthralgias. Negative for myalgias, joint swelling and gait problem.  All other systems reviewed and are negative.       Objective:   Physical Exam  Nursing note and vitals reviewed. Constitutional: She is oriented to person, place, and time. She appears well-developed and well-nourished.  HENT:  Head: Normocephalic and atraumatic.  Eyes: Pupils are equal, round, and reactive to light.  Neck: Normal range of motion. Neck supple. No JVD present. No thyromegaly present.  Cardiovascular: Normal rate, regular rhythm and normal heart sounds.   No murmur heard. Pulmonary/Chest: Effort normal and breath sounds normal. She has no wheezes. She has no rales.  Abdominal: Soft. Bowel sounds are normal.  Musculoskeletal: She exhibits no edema.       Right knee: She exhibits decreased range of motion. She exhibits no swelling, no effusion, no deformity, normal alignment, no LCL laxity, no bony tenderness, normal meniscus and no MCL laxity. tenderness found.  Neurological: She is alert and  oriented to person, place, and time.  Skin: Skin is warm and dry.          Assessment & Plan:

## 2011-02-21 NOTE — Assessment & Plan Note (Signed)
Patient's last A1c was 8.8 in August 2012, will recheck today, however given the elevated CBGs will increase her Lantus from 65 to 70 units daily. Otherwise patient is up-to-date with her health maintenance, her cholesterol and blood pressure are within goal, foot exam performed today which was within normal limits. We'll followup again in 3 months. Continue to encourage diet and exercise

## 2011-02-21 NOTE — Assessment & Plan Note (Signed)
She has a history of CAD, status post PCI in her RCA twice were all in 2005 following CP and positive myoview. Last cath was in 2010 due to recurrent CP and a positive myoview, on cath she has patent stents and diffuse disease in non-dominant circ. No further intervention was performed then. However patient has been on Plavix since 2005. Given the patient currently has stable CAD and no recent stenting, she has no strong indication for continuing Plavix, therefore I have advised patient to discontinue Plavix from today.

## 2011-03-03 ENCOUNTER — Other Ambulatory Visit: Payer: Self-pay | Admitting: Internal Medicine

## 2011-03-06 ENCOUNTER — Ambulatory Visit: Payer: Medicaid Other | Admitting: Ophthalmology

## 2011-03-06 ENCOUNTER — Telehealth: Payer: Self-pay | Admitting: *Deleted

## 2011-03-06 NOTE — Telephone Encounter (Signed)
Pt calls and states she has had pain in her L breast and underarm for 2 weeks progressively becoming worse, she states she tries to do self exam and it is so painful  (9/10) that she cannot do it, it is also hard to raise her L arm. appt is given for today w/ dr Marcello Moores at 1330 per chilonb.

## 2011-03-08 ENCOUNTER — Ambulatory Visit (INDEPENDENT_AMBULATORY_CARE_PROVIDER_SITE_OTHER): Payer: Medicaid Other | Admitting: Ophthalmology

## 2011-03-08 ENCOUNTER — Encounter: Payer: Self-pay | Admitting: Ophthalmology

## 2011-03-08 ENCOUNTER — Ambulatory Visit (HOSPITAL_COMMUNITY)
Admission: RE | Admit: 2011-03-08 | Discharge: 2011-03-08 | Disposition: A | Discharge status: Home / Self Care | Payer: Medicaid Other | Source: Ambulatory Visit | Attending: Internal Medicine | Admitting: Internal Medicine

## 2011-03-08 VITALS — BP 133/67 | HR 68 | Temp 97.0°F | Ht 64.3 in | Wt 249.2 lb

## 2011-03-08 DIAGNOSIS — R0789 Other chest pain: Secondary | ICD-10-CM | POA: Insufficient documentation

## 2011-03-08 DIAGNOSIS — R059 Cough, unspecified: Secondary | ICD-10-CM | POA: Insufficient documentation

## 2011-03-08 DIAGNOSIS — E119 Type 2 diabetes mellitus without complications: Secondary | ICD-10-CM

## 2011-03-08 DIAGNOSIS — N644 Mastodynia: Secondary | ICD-10-CM | POA: Insufficient documentation

## 2011-03-08 DIAGNOSIS — R05 Cough: Secondary | ICD-10-CM | POA: Insufficient documentation

## 2011-03-08 LAB — GLUCOSE, CAPILLARY: Glucose-Capillary: 177 mg/dL — ABNORMAL HIGH (ref 70–99)

## 2011-03-08 MED ORDER — DICLOFENAC SODIUM 1 % TD GEL: 1.0000 "application " | Freq: Four times a day (QID) | TRANSDERMAL | Status: DC

## 2011-03-08 NOTE — Assessment & Plan Note (Signed)
Pain is likely musculoskeletal in nature. Prescribed patient voltaren gel since she has chronic kidney disease. Dr. Hilma Favors obtained prior authorization. Ordered chest Xray and mammogram of left breast for possible pulmonary or breast causes.

## 2011-03-08 NOTE — Progress Notes (Signed)
Subjective:   Patient ID: Ann [name] female   DOB: Oct 24, 1950 60 y.o.   MRN: [id number]  HPI: [name] is a 60 y.o. lady who presents with left breast pain for 1 week.  Pain in breast and burning sensation in armpit. Pleuritic and provoked by cough. Pain came on at night, turned over on side and had pain in breast. Also has itching. When exercises, feel heaviness but not similar to pain she had before her catheterization. No periods of immobilization. No SOB. Cough, wheeze not worse than normal. No fever. Right handed. Has gotten a little worse over the week. Has some history of soreness of left shoulder. No heavy lifting or trauma to the breast. Patient notes small amount of white dried discharge from nipple a few days ago.  Past Medical History  Diagnosis Date  . Hyperlipidemia 10/21/2009  . Chronic kidney disease (CKD), stage III (moderate) 10/20/2009  . Shoulder pain, left 03/16/2009    occurs at night  . Lumbar back pain 02/16/2009    states pain goes down right leg  . Myalgia 02/16/2009  . Chest pain 02/16/2009  . Asthma 09/29/2008  . CAD (coronary artery disease) 09/29/2008  . Anemia 09/29/2008    anemia of chronic disease  . Insulin dependent diabetes mellitus type IA 09/29/2008  . Hypertension 09/29/2008   Current Outpatient Prescriptions  Medication Sig Dispense Refill  . amLODipine (NORVASC) 10 MG tablet Take 1 tablet (10 mg total) by mouth daily.  30 tablet  3  . aspirin 81 MG tablet Take 81 mg by mouth daily.        . cyclobenzaprine (FLEXERIL) 5 MG tablet Take by moth 1/2 or 1 tab at night prn back spasm  30 tablet  1  . diclofenac sodium (VOLTAREN) 1 % GEL Apply 1 application topically 4 (four) times daily.  100 g  0  . EASY TOUCH PEN NEEDLES 31G X 8 MM MISC USE TO INJECT INSULIN AS INSTRUCTED  100 each  PRN  . glucose blood (ACCU-CHEK AVIVA) test strip Use as instructed  100 each  12  . insulin glargine (LANTUS SOLOSTAR) 100 UNIT/ML injection Inject 70 Units  into the skin at bedtime.  10 mL  6  . isosorbide dinitrate (ISORDIL) 30 MG tablet Take 30 mg by mouth daily.        . Lancets (ACCU-CHEK MULTICLIX) lancets Use as instructed  100 each  12  . lisinopril-hydrochlorothiazide (PRINZIDE,ZESTORETIC) 20-12.5 MG per tablet TAKE 2 TABLETS BY MOUTH EVERY DAY  60 tablet  PRN  . loratadine (CLARITIN) 10 MG tablet Take 10 mg by mouth daily.        . metoprolol (LOPRESSOR) 50 MG tablet TAKE 1 TABLET BY MOUTH TWICE DAILY  60 tablet  PRN  . niacin (NIASPAN) 500 MG CR tablet Take 2 tablets (1,000 mg total) by mouth at bedtime.  60 tablet  3  . nitroGLYCERIN (NITROSTAT) 0.4 MG SL tablet Place 0.4 mg under the tongue every 5 (five) minutes as needed. As needed for chest pain up to 3 doses       . pantoprazole (PROTONIX) 20 MG tablet TAKE 1 TABLET BY MOUTH DAILY  30 tablet  PRN  . pravastatin (PRAVACHOL) 40 MG tablet Take 1 tablet (40 mg total) by mouth every evening.  30 tablet  11  . sennosides-docusate sodium (SENOKOT-S) 8.6-50 MG tablet Take 1 tablet by mouth as needed. For constipation       . VENTOLIN HFA 108 (90 BASE)  MCG/ACT inhaler INHALE ONE TO TWO PUFFS BY MOUTH EVERY 4 HOURS AS NEEDED FOR SHORTNESS OF BREATH  1 Inhaler  3   No family history on file. History   Social History  . Marital Status: Legally Separated    Spouse Name: N/A    Number of Children: N/A  . Years of Education: N/A   Social History Main Topics  . Smoking status: Never Smoker   . Smokeless tobacco: Current User    Types: Snuff  . Alcohol Use: No  . Drug Use: No  . Sexually Active: None   Other Topics Concern  . None   Social History Narrative  . None   Objective:  Physical Exam: Filed Vitals:   03/08/11 1332  BP: 133/67  Pulse: 68  Temp: 97 F (36.1 C)  TempSrc: Oral  Height: 5' 4.3" (1.633 m)  Weight: 249 lb 3.2 oz (113.036 kg)  SpO2: 99%   Constitutional: Vital signs reviewed.  Patient is a well-developed and well-nourished woman in no acute distress and  cooperative with exam.  Eyes: PERRL, EOMI, conjunctivae normal, No scleral icterus.  Cardiovascular: RRR, S1 normal, S2 normal, no MRG, pulses symmetric and intact bilaterally Pulmonary/Chest: CTAB, no wheezes, rales, or rhonchi Abdominal: Soft. Non-tender, non-distended, bowel sounds are normal Neurological: A&O x3, cranial nerve II-XII are grossly intact. No allodynia of breast tissue or back, axilla. Skin/Breast: No redness, or skin lesion on breast or in axilla or inframammary tissue. No discharge from nipple. Pain is reproduced by having patient reach across her body. No pain with other movements. Psychiatric: Normal mood and affect. speech and behavior is normal. Judgment and thought content normal. Cognition and memory are normal.   Assessment & Plan:

## 2011-03-08 NOTE — Patient Instructions (Signed)
Please use voltaren gel up to 4x a day on your left breast. Please call clinic if you notice any fluid filled bumps in the area. Use tylenol for pain. Get mammogram and chest X-ray.

## 2011-03-22 ENCOUNTER — Ambulatory Visit
Admission: RE | Admit: 2011-03-22 | Discharge: 2011-03-22 | Disposition: A | Discharge status: Home / Self Care | Payer: Medicaid Other | Source: Ambulatory Visit | Attending: Internal Medicine | Admitting: Internal Medicine

## 2011-03-22 DIAGNOSIS — N644 Mastodynia: Secondary | ICD-10-CM

## 2011-03-27 ENCOUNTER — Other Ambulatory Visit: Payer: Self-pay | Admitting: Internal Medicine

## 2011-04-04 ENCOUNTER — Other Ambulatory Visit: Payer: Self-pay | Admitting: Internal Medicine

## 2011-05-02 ENCOUNTER — Other Ambulatory Visit: Payer: Self-pay | Admitting: Internal Medicine

## 2011-05-17 ENCOUNTER — Encounter: Payer: Medicaid Other | Admitting: Internal Medicine

## 2011-05-30 ENCOUNTER — Other Ambulatory Visit: Payer: Self-pay | Admitting: Internal Medicine

## 2011-06-20 ENCOUNTER — Other Ambulatory Visit: Payer: Self-pay | Admitting: Internal Medicine

## 2011-06-26 ENCOUNTER — Encounter: Payer: Self-pay | Admitting: Internal Medicine

## 2011-06-26 ENCOUNTER — Ambulatory Visit (INDEPENDENT_AMBULATORY_CARE_PROVIDER_SITE_OTHER): Payer: Medicaid Other | Admitting: Internal Medicine

## 2011-06-26 VITALS — BP 145/73 | HR 76 | Temp 97.4°F | Resp 20 | Ht 63.5 in | Wt 247.3 lb

## 2011-06-26 DIAGNOSIS — E109 Type 1 diabetes mellitus without complications: Secondary | ICD-10-CM

## 2011-06-26 DIAGNOSIS — N183 Chronic kidney disease, stage 3 unspecified: Secondary | ICD-10-CM

## 2011-06-26 DIAGNOSIS — E119 Type 2 diabetes mellitus without complications: Secondary | ICD-10-CM

## 2011-06-26 DIAGNOSIS — I10 Essential (primary) hypertension: Secondary | ICD-10-CM

## 2011-06-26 DIAGNOSIS — Z79899 Other long term (current) drug therapy: Secondary | ICD-10-CM

## 2011-06-26 DIAGNOSIS — I129 Hypertensive chronic kidney disease with stage 1 through stage 4 chronic kidney disease, or unspecified chronic kidney disease: Secondary | ICD-10-CM

## 2011-06-26 LAB — POCT GLYCOSYLATED HEMOGLOBIN (HGB A1C): Hemoglobin A1C: 7.3

## 2011-06-26 LAB — BASIC METABOLIC PANEL WITH GFR
BUN: 21 mg/dL (ref 6–23)
CO2: 24 mEq/L (ref 19–32)
Calcium: 9.5 mg/dL (ref 8.4–10.5)
Chloride: 105 mEq/L (ref 96–112)
Creat: 1.78 mg/dL — ABNORMAL HIGH (ref 0.50–1.10)
GFR, Est African American: 35 mL/min — ABNORMAL LOW
GFR, Est Non African American: 30 mL/min — ABNORMAL LOW
Glucose, Bld: 74 mg/dL (ref 70–99)
Potassium: 4.7 mEq/L (ref 3.5–5.3)
Sodium: 139 mEq/L (ref 135–145)

## 2011-06-26 LAB — GLUCOSE, CAPILLARY: Glucose-Capillary: 76 mg/dL (ref 70–99)

## 2011-06-26 NOTE — Assessment & Plan Note (Signed)
No changes to regimen today. Patient using 55 units at night time.

## 2011-06-26 NOTE — Assessment & Plan Note (Signed)
Check BMP today 

## 2011-06-26 NOTE — Assessment & Plan Note (Addendum)
Patient has had variable BP in the past but most recently controlled. I will not change meds today and will have her follow up in 1 month. If still elevated then we may need to increase diuretic based on her volume excess being a CKD patient.

## 2011-06-26 NOTE — Patient Instructions (Signed)
1500 Calorie Diabetic Diet The 1500 calorie diabetic diet limits calories to 1500 each day. Following this diet and making healthy meal choices can help improve overall health. It controls blood glucose (sugar) levels and can also help lower blood pressure and cholesterol.  SERVING SIZES Measuring foods and serving sizes helps to make sure you are getting the right amount of food. The list below tells how big or small some common serving sizes are.  1 oz.........4 stacked dice.   3 oz........Marland KitchenDeck of cards.   1 tsp.......Marland KitchenTip of little finger.   1 tbs......Marland KitchenMarland KitchenThumb.   2 tbs.......Marland KitchenGolf ball.    cup......Marland KitchenHalf of a fist.   1 cup.......Marland KitchenA fist.  GUIDELINES FOR CHOOSING FOODS The goal of this diet is to eat a variety of foods and limit calories to 1500 each day. This can be done by choosing foods that are low in calories and fat. The diet also suggests eating small amounts of food frequently. Doing this helps control your blood glucose levels, so they do not get too high or too low. Each meal or snack may include a protein food source to help you feel more satisfied. Try to eat about the same amount of food around the same time each day. This includes weekend days, travel days, and days off work. Space your meals about 4 to 5 hours apart, and add a snack between them, if you wish.  For example, a daily food plan could include breakfast, a morning snack, lunch, dinner, and an evening snack. Healthy meals and snacks have different types of foods, including whole grains, vegetables, fruits, lean meats, poultry, fish, and dairy products. As you plan your meals, select a variety of foods. Choose from the bread and starch, vegetable, fruit, dairy, and meat/protein groups. Examples of foods from each group are listed below, with their suggested serving sizes. Use measuring cups and spoons to become familiar with what a healthy portion looks like. Bread and Starch Each serving equals 15 grams of  carbohydrate.  1 slice bread.    bagel.    cup cold cereal (unsweetened).    cup hot cereal or mashed potatoes.   1 small potato (size of a computer mouse).   ? cup cooked pasta or rice.    English muffin.   1 cup broth-based soup.   3 cups of popcorn.   4 to 6 whole-wheat crackers.    cup cooked beans, peas, or corn.  Vegetables Each serving equals 5 grams of carbohydrate.   cup cooked vegetables.   1 cup raw vegetables.    cup tomato or vegetable juice.  Fruit Each serving equals 15 grams of carbohydrate.  1 small apple or orange.   1  cup watermelon or strawberries.    cup applesauce (no sugar added).   2 tbs raisins.    banana.    cup canned fruit, packed in water or in its own juice.    cup unsweetened fruit juice.  Dairy Each serving equals 12 to 15 grams of carbohydrate.  1 cup fat-free milk.   6 oz artificially sweetened yogurt or plain yogurt.   1 cup low-fat buttermilk.   1 cup soy milk.   1 cup almond milk.  Meat/Protein  1 large egg.   2 to 3 oz meat, poultry, or fish.    cup low-fat cottage cheese.   1 tbs peanut butter.   1 oz low-fat cheese.    cup tuna, packed in water.    cup tofu.  Fat  1 tsp oil.   1 tsp trans-fat-free margarine.   1 tsp butter.   1 tsp mayonnaise.   2 tbs avocado.   1 tbs salad dressing.   1 tbs cream cheese.   2 tbs sour cream.  SAMPLE 1500 CALORIE DIET PLAN Breakfast   whole-wheat English muffin (1 carb serving).   1 tsp trans-fat-free margarine.   1 scrambled egg.   1 cup fat-free milk (1 carb serving).   1 small orange (1 carb serving).  Lunch  Chicken wrap.   1 whole-wheat tortilla, 8-inch (1 carb servings).   2 oz chicken breast, sliced.   2 tbs low-fat salad dressing, such as [place].    cup shredded lettuce.   2 slices tomato.    cup carrot sticks.   1 small apple (1 carb serving).  Afternoon Snack  3 graham cracker squares (1 carb  serving).   1 tbs peanut butter.  Dinner  2 oz lean pork chop, broiled.   1 cup brown rice (3 carb servings).    cup steamed carrots.    cup green beans.   1 cup fat-free milk (1 carb serving).   1 tsp trans-fat-free margarine.  Evening Snack   cup low-fat cottage cheese.   1 small peach or pear, sliced (or  cup canned in water) (1 carb serving).  MEAL PLAN You can use this worksheet to help you make a daily meal plan based on the 1500 calorie diabetic diet suggestions. If you are using this plan to help you control your blood glucose, you may interchange carbohydrate containing foods (dairy, starches, and fruits). Select a variety of fresh foods of varying colors and flavors. The total amount of carbohydrate in your meals or snacks is more important than making sure you include all of the food groups every time you eat. You can choose from approximately this many of the following foods to build your day's meals:  6 Starches.   3 Vegetables.   2 Fruits.   2 Dairy.   4 to 6 oz Meat/Protein.   Up to 3 Fats.  Your dietician can use this worksheet to help you decide how many servings and which types of foods are right for you. BREAKFAST Food Group and Servings / Food Choice Starch _________________________________________________________ Dairy __________________________________________________________ Fruit ___________________________________________________________ Meat/Protein____________________________________________________ Fat ____________________________________________________________ LUNCH Food Group and Servings / Food Choice  Starch _________________________________________________________ Meat/Protein ___________________________________________________ Vegetables _____________________________________________________ Fruit __________________________________________________________ Dairy __________________________________________________________ Fat  ____________________________________________________________ Ann [name] Food Group and Servings / Food Choice Dairy __________________________________________________________ Starch _________________________________________________________ Meat/Protein____________________________________________________ Ann [name] ___________________________________________________________ Ann [name] Food Group and Servings / Food Choice Starch _________________________________________________________ Meat/Protein ___________________________________________________ Dairy __________________________________________________________ Vegetable ______________________________________________________ Fruit ___________________________________________________________ Fat ____________________________________________________________ Ann [name] Food Group and Servings / Food Choice Fruit ___________________________________________________________ Meat/Protein ____________________________________________________ Dairy __________________________________________________________ Starch __________________________________________________________ DAILY TOTALS Starches _________________________ Vegetables _______________________ Fruits ____________________________ Dairy ____________________________ Meat/Protein_____________________ Fats _____________________________ Document Released: 10/31/2004 Document Revised: 03/30/2011 Document Reviewed: 02/25/2009 [employer] Patient Information 2012 [place].

## 2011-06-26 NOTE — Progress Notes (Signed)
  Subjective:    Patient ID: Ann [name], female    DOB: 05/13/1950, 61 y.o.   MRN: [id number]  HPI  Patient is here today for a regular follow up.  Her BP is slightly elevated today but she says that she took her medication prior to coming for appointment.  Diabetes is well controlled with Hba1c improved to 7.3 from 8.4 last visit.  Has knee surgery coming up and has an appointment with cardiologist for surgical clearance.  No other complaints.   Review of Systems  Constitutional: Negative for fever, activity change and appetite change.  HENT: Negative for sore throat.   Respiratory: Negative for cough and shortness of breath.   Cardiovascular: Negative for chest pain and leg swelling.  Gastrointestinal: Negative for nausea, abdominal pain, diarrhea, constipation and abdominal distention.  Genitourinary: Negative for frequency, hematuria and difficulty urinating.  Neurological: Negative for dizziness and headaches.  Psychiatric/Behavioral: Negative for suicidal ideas and behavioral problems.       Objective:   Physical Exam  Constitutional: She is oriented to person, place, and time. She appears well-developed and well-nourished.  HENT:  Head: Normocephalic and atraumatic.  Eyes: Conjunctivae and EOM are normal. Pupils are equal, round, and reactive to light. No scleral icterus.  Neck: Normal range of motion. Neck supple. No JVD present. No thyromegaly present.  Cardiovascular: Normal rate, regular rhythm, normal heart sounds and intact distal pulses.  Exam reveals no gallop and no friction rub.   No murmur heard. Pulmonary/Chest: Effort normal and breath sounds normal. No respiratory distress. She has no wheezes. She has no rales.  Abdominal: Soft. Bowel sounds are normal. She exhibits no distension and no mass. There is no tenderness. There is no rebound and no guarding.  Musculoskeletal: Normal range of motion. She exhibits no edema and no tenderness.  Lymphadenopathy:   She has no cervical adenopathy.  Neurological: She is alert and oriented to person, place, and time.  Psychiatric: She has a normal mood and affect. Her behavior is normal.          Assessment & Plan:

## 2011-07-27 ENCOUNTER — Encounter: Payer: Medicaid Other | Admitting: Internal Medicine

## 2011-08-23 ENCOUNTER — Other Ambulatory Visit: Payer: Self-pay | Admitting: Internal Medicine

## 2011-09-22 ENCOUNTER — Emergency Department (HOSPITAL_COMMUNITY)
Admission: EM | Admit: 2011-09-22 | Discharge: 2011-09-22 | Disposition: A | Discharge status: Home / Self Care | Payer: Medicaid Other | Attending: Emergency Medicine | Admitting: Emergency Medicine

## 2011-09-22 ENCOUNTER — Encounter (HOSPITAL_COMMUNITY): Payer: Self-pay | Admitting: *Deleted

## 2011-09-22 DIAGNOSIS — E119 Type 2 diabetes mellitus without complications: Secondary | ICD-10-CM | POA: Insufficient documentation

## 2011-09-22 DIAGNOSIS — I2510 Atherosclerotic heart disease of native coronary artery without angina pectoris: Secondary | ICD-10-CM | POA: Insufficient documentation

## 2011-09-22 DIAGNOSIS — M722 Plantar fascial fibromatosis: Secondary | ICD-10-CM | POA: Insufficient documentation

## 2011-09-22 DIAGNOSIS — Z794 Long term (current) use of insulin: Secondary | ICD-10-CM | POA: Insufficient documentation

## 2011-09-22 DIAGNOSIS — I129 Hypertensive chronic kidney disease with stage 1 through stage 4 chronic kidney disease, or unspecified chronic kidney disease: Secondary | ICD-10-CM | POA: Insufficient documentation

## 2011-09-22 DIAGNOSIS — N183 Chronic kidney disease, stage 3 unspecified: Secondary | ICD-10-CM | POA: Insufficient documentation

## 2011-09-22 DIAGNOSIS — J45909 Unspecified asthma, uncomplicated: Secondary | ICD-10-CM | POA: Insufficient documentation

## 2011-09-22 MED ORDER — IBUPROFEN 600 MG PO TABS: 600.0000 mg | ORAL_TABLET | Freq: Four times a day (QID) | ORAL | Status: AC | PRN

## 2011-09-22 MED ORDER — BETAMETHASONE SOD PHOS & ACET 6 (3-3) MG/ML IJ SUSP
12.0000 mg | Freq: Once | INTRAMUSCULAR | Status: AC
  Administered 2011-09-22: 12 mg via INTRAMUSCULAR
  Filled 2011-09-22: qty 2

## 2011-09-22 MED ORDER — BUPIVACAINE HCL 0.25 % IJ SOLN
1.0000 mL | Freq: Once | INTRAMUSCULAR | Status: AC
  Administered 2011-09-22: 1 mL
  Filled 2011-09-22: qty 1

## 2011-09-22 NOTE — ED Notes (Addendum)
Patient state she noticed sorness in her left foot and noted a knot in the arch of left foot x 5 days and continues to be painful and swollen. Patient also has c/o of sore throat x 2 days. Patient denies any injury.

## 2011-09-22 NOTE — ED Provider Notes (Signed)
History     CSN: [id number]  Arrival date & time 09/22/11  0808   First MD Initiated Contact with Patient 09/22/11 0820      No chief complaint on file.   (Consider location/radiation/quality/duration/timing/severity/associated sxs/prior treatment) HPI The patient is a 61 yo woman, history of CAD, DM, HTN, presenting with right foot pain.  The patient notes a 5-day history of right foot pain, described as a sharp heel and medial foot pain, worse with weight bearing, which has slowly worsened over the last few days.  She notes no preceding trauma, fevers, erythema, and no similar symptoms in the past.  The patient is scheduled for an upcoming R knee surgery.   Past Medical History  Diagnosis Date  . Hyperlipidemia 10/21/2009  . Chronic kidney disease (CKD), stage III (moderate) 10/20/2009  . Shoulder pain, left 03/16/2009    occurs at night  . Lumbar back pain 02/16/2009    states pain goes down right leg  . Myalgia 02/16/2009  . Chest pain 02/16/2009  . Asthma 09/29/2008  . CAD (coronary artery disease) 09/29/2008  . Anemia 09/29/2008    anemia of chronic disease  . Insulin dependent diabetes mellitus type IA 09/29/2008  . Hypertension 09/29/2008    Past Surgical History  Procedure Date  . Appendectomy   . Cholecystectomy   . Total knee arthroplasty     Left    History reviewed. No pertinent family history.  History  Substance Use Topics  . Smoking status: Never Smoker   . Smokeless tobacco: Current User    Types: Snuff  . Alcohol Use: No    OB History    Grav Para Term Preterm Abortions TAB SAB Ect Mult Living                  Review of Systems General: no fevers, chills, changes in weight, changes in appetite Skin: no rash HEENT: no blurry vision, hearing changes, sore throat Pulm: see HPI CV: no chest pain, palpitations, shortness of breath Abd: no abdominal pain, nausea/vomiting, diarrhea/constipation GU: no dysuria, hematuria, polyuria Ext: no  arthralgias, myalgias Neuro: no weakness, numbness, or tingling  Allergies  Codeine and Metronidazole  Home Medications   Current Outpatient Rx  Name Route Sig Dispense Refill  . AMLODIPINE BESYLATE 10 MG PO TABS  TAKE 1 TABLET BY MOUTH DAILY 30 tablet PRN  . ASPIRIN 81 MG PO TABS Oral Take 81 mg by mouth daily.      . INSULIN GLARGINE 100 UNIT/ML [place] SOLN Subcutaneous Inject 70 Units into the skin at bedtime.     . ISOSORBIDE DINITRATE 30 MG PO TABS Oral Take 30 mg by mouth daily.      Marland Kitchen LISINOPRIL-HYDROCHLOROTHIAZIDE 20-12.5 MG PO TABS  TAKE 2 TABLETS BY MOUTH EVERY DAY 60 tablet 5  . METOPROLOL TARTRATE 50 MG PO TABS  TAKE 1 TABLET BY MOUTH TWICE DAILY 60 tablet 5  . PANTOPRAZOLE SODIUM 20 MG PO TBEC  TAKE 1 TABLET BY MOUTH DAILY 30 tablet 5  . PRAVASTATIN SODIUM 40 MG PO TABS  TAKE ONE TABLET BY MOUTH EVERY EVENING 30 tablet 5  . VENTOLIN HFA 108 (90 BASE) MCG/ACT IN AERS  INHALE 1-2 PUFFS BY MOUTH EVERY 4 HOURS AS NEEDED FOR SHORTNESS OF BREATH 1 Inhaler 11  . ACCU-CHEK AVIVA PLUS VI STRP  USE AS DIRECTED 50 each 2  . EASY TOUCH PEN NEEDLES 31G X 8 MM MISC  USE TO INJECT INSULIN AS INSTRUCTED 100 each PRN  .  ACCU-CHEK MULTICLIX LANCETS MISC  USE AS DIRECTED 102 each 2    BP 172/83  Pulse 86  Temp(Src) 98.3 F (36.8 C) (Oral)  Resp 16  SpO2 100%  Physical Exam General: alert, cooperative, and in no apparent distress HEENT: pupils equal round and reactive to light, vision grossly intact, oropharynx clear and non-erythematous  Neck: supple, no lymphadenopathy Lungs: clear to ascultation bilaterally, normal work of respiration, no wheezes, rales, ronchi Heart: regular rate and rhythm, no murmurs, gallops, or rubs Abdomen: soft, non-tender, non-distended, normal bowel sounds Extremities: Right foot with ttp at insertion of plantar fascia, and with palpation of medial plantar fascia Neurologic: alert & oriented X3, cranial nerves II-XII intact, strength grossly intact, sensation  intact to light touch  ED Course  Procedures (including critical care time)  Labs Reviewed - No data to display No results found.   No diagnosis found.    MDM   # Right foot pain - exam consistent with plantar fasciitis.  No history of trauma, erythema, or fever. -betamethasone and bupivicaine injection  Procedure note Plantar fascia injection The right foot was cleaned with chloraprep.  A syringe was prepared with 2 cc betamethasone and 1 cc bupivacaine 0.25%.  The needle was inserted into the medial heel, and the mixture was injected.  The needle was withdrawn, and the area was covered with a bandage.  No complications.  Hester Mates, MD 09/22/11 1025

## 2011-09-22 NOTE — Discharge Instructions (Signed)
Plantar Fasciitis (Heel Spur Syndrome) with Rehab The plantar fascia is a fibrous, ligament-like, soft-tissue structure that spans the bottom of the foot. Plantar fasciitis is a condition that causes pain in the foot due to inflammation of the tissue. SYMPTOMS   Pain and tenderness on the underneath side of the foot.   Pain that worsens with standing or walking.  CAUSES  Plantar fasciitis is caused by irritation and injury to the plantar fascia on the underneath side of the foot. Common mechanisms of injury include:  Direct trauma to bottom of the foot.   Damage to a small nerve that runs under the foot where the main fascia attaches to the heel bone.   Stress placed on the plantar fascia due to bone spurs.  RISK INCREASES WITH:   Activities that place stress on the plantar fascia (running, jumping, pivoting, or cutting).   Poor strength and flexibility.   Improperly fitted shoes.   Tight calf muscles.   Flat feet.   Failure to warm-up properly before activity.   Obesity.  PREVENTION  Warm up and stretch properly before activity.   Allow for adequate recovery between workouts.   Maintain physical fitness:   Strength, flexibility, and endurance.   Cardiovascular fitness.   Maintain a health body weight.   Avoid stress on the plantar fascia.   Wear properly fitted shoes, including arch supports for individuals who have flat feet.  PROGNOSIS  If treated properly, then the symptoms of plantar fasciitis usually resolve without surgery. However, occasionally surgery is necessary. RELATED COMPLICATIONS   Recurrent symptoms that may result in a chronic condition.   Problems of the lower back that are caused by compensating for the injury, such as limping.   Pain or weakness of the foot during push-off following surgery.   Chronic inflammation, scarring, and partial or complete fascia tear, occurring more often from repeated injections.  TREATMENT  Treatment  initially involves the use of ice and medication to help reduce pain and inflammation. The use of strengthening and stretching exercises may help reduce pain with activity, especially stretches of the Achilles tendon. These exercises may be performed at home or with a therapist. Your caregiver may recommend that you use heel cups of arch supports to help reduce stress on the plantar fascia. Occasionally, corticosteroid injections are given to reduce inflammation. If symptoms persist for greater than 6 months despite non-surgical (conservative), then surgery may be recommended.  MEDICATION   If pain medication is necessary, then nonsteroidal anti-inflammatory medications, such as aspirin and ibuprofen, or other minor pain relievers, such as acetaminophen, are often recommended.   Do not take pain medication within 7 days before surgery.   Prescription pain relievers may be given if deemed necessary by your caregiver. Use only as directed and only as much as you need.   Corticosteroid injections may be given by your caregiver. These injections should be reserved for the most serious cases, because they may only be given a certain number of times.  HEAT AND COLD  Cold treatment (icing) relieves pain and reduces inflammation. Cold treatment should be applied for 10 to 15 minutes every 2 to 3 hours for inflammation and pain and immediately after any activity that aggravates your symptoms. Use ice packs or massage the area with a piece of ice (ice massage).   Heat treatment may be used prior to performing the stretching and strengthening activities prescribed by your caregiver, physical therapist, or athletic trainer. Use a heat pack or soak the   injury in warm water.  SEEK IMMEDIATE MEDICAL CARE IF:  Treatment seems to offer no benefit, or the condition worsens.   Any medications produce adverse side effects.  EXERCISES RANGE OF MOTION (ROM) AND STRETCHING EXERCISES - Plantar Fasciitis (Heel Spur  Syndrome) These exercises may help you when beginning to rehabilitate your injury. Your symptoms may resolve with or without further involvement from your physician, physical therapist or athletic trainer. While completing these exercises, remember:   Restoring tissue flexibility helps normal motion to return to the joints. This allows healthier, less painful movement and activity.   An effective stretch should be held for at least 30 seconds.   A stretch should never be painful. You should only feel a gentle lengthening or release in the stretched tissue.  RANGE OF MOTION - Toe Extension, Flexion  Sit with your right / left leg crossed over your opposite knee.   Grasp your toes and gently pull them back toward the top of your foot. You should feel a stretch on the bottom of your toes and/or foot.   Hold this stretch for __________ seconds.   Now, gently pull your toes toward the bottom of your foot. You should feel a stretch on the top of your toes and or foot.   Hold this stretch for __________ seconds.  Repeat __________ times. Complete this stretch __________ times per day.  RANGE OF MOTION - Ankle Dorsiflexion, Active Assisted  Remove shoes and sit on a chair that is preferably not on a carpeted surface.   Place right / left foot under knee. Extend your opposite leg for support.   Keeping your heel down, slide your right / left foot back toward the chair until you feel a stretch at your ankle or calf. If you do not feel a stretch, slide your bottom forward to the edge of the chair, while still keeping your heel down.   Hold this stretch for __________ seconds.  Repeat __________ times. Complete this stretch __________ times per day.  STRETCH - Gastroc, Standing  Place hands on wall.   Extend right / left leg, keeping the front knee somewhat bent.   Slightly point your toes inward on your back foot.   Keeping your right / left heel on the floor and your knee straight, shift  your weight toward the wall, not allowing your back to arch.   You should feel a gentle stretch in the right / left calf. Hold this position for __________ seconds.  Repeat __________ times. Complete this stretch __________ times per day. STRETCH - [place]us, Standing  Place hands on wall.   Extend right / left leg, keeping the other knee somewhat bent.   Slightly point your toes inward on your back foot.   Keep your right / left heel on the floor, bend your back knee, and slightly shift your weight over the back leg so that you feel a gentle stretch deep in your back calf.   Hold this position for __________ seconds.  Repeat __________ times. Complete this stretch __________ times per day. STRETCH - Gastrocsoleus, Standing  Note: This exercise can place a lot of stress on your foot and ankle. Please complete this exercise only if specifically instructed by your caregiver.   Place the ball of your right / left foot on a step, keeping your other foot firmly on the same step.   Hold on to the wall or a rail for balance.   Slowly lift your other foot, allowing your   body weight to press your heel down over the edge of the step.   You should feel a stretch in your right / left calf.   Hold this position for __________ seconds.   Repeat this exercise with a slight bend in your right / left knee.  Repeat __________ times. Complete this stretch __________ times per day.  STRENGTHENING EXERCISES - Plantar Fasciitis (Heel Spur Syndrome)  These exercises may help you when beginning to rehabilitate your injury. They may resolve your symptoms with or without further involvement from your physician, physical therapist or athletic trainer. While completing these exercises, remember:   Muscles can gain both the endurance and the strength needed for everyday activities through controlled exercises.   Complete these exercises as instructed by your physician, physical therapist or athletic trainer.  Progress the resistance and repetitions only as guided.  STRENGTH - Towel Curls  Sit in a chair positioned on a non-carpeted surface.   Place your foot on a towel, keeping your heel on the floor.   Pull the towel toward your heel by only curling your toes. Keep your heel on the floor.   If instructed by your physician, physical therapist or athletic trainer, add ____________________ at the end of the towel.  Repeat __________ times. Complete this exercise __________ times per day. STRENGTH - Ankle Inversion  Secure one end of a rubber exercise band/tubing to a fixed object (table, pole). Loop the other end around your foot just before your toes.   Place your fists between your knees. This will focus your strengthening at your ankle.   Slowly, pull your big toe up and in, making sure the band/tubing is positioned to resist the entire motion.   Hold this position for __________ seconds.   Have your muscles resist the band/tubing as it slowly pulls your foot back to the starting position.  Repeat __________ times. Complete this exercises __________ times per day.  Document Released: 04/10/2005 Document Revised: 03/30/2011 Document Reviewed: 07/23/2008 [employer] Patient Information 2012 [employer]. 

## 2011-09-22 NOTE — ED Notes (Addendum)
Patient states she found a knot in the arch of left foot and swelling x 5 days. Pain and swelling continues today and pain getting worse.

## 2011-09-22 NOTE — ED Provider Notes (Signed)
I saw and evaluated the patient, reviewed the resident's note and I agree with the findings and plan.   .Face to face Exam:  General:  Awake HEENT:  Atraumatic Resp:  Normal effort Abd:  Nondistended Neuro:No focal weakness Lymph: No adenopathy   Dot Lanes, MD 09/22/11 [phone]

## 2011-09-28 ENCOUNTER — Encounter: Payer: Self-pay | Admitting: Internal Medicine

## 2011-09-28 ENCOUNTER — Ambulatory Visit (INDEPENDENT_AMBULATORY_CARE_PROVIDER_SITE_OTHER): Payer: Medicaid Other | Admitting: Internal Medicine

## 2011-09-28 VITALS — BP 114/65 | HR 66 | Temp 98.1°F | Ht 63.0 in | Wt 247.2 lb

## 2011-09-28 DIAGNOSIS — N183 Chronic kidney disease, stage 3 unspecified: Secondary | ICD-10-CM

## 2011-09-28 DIAGNOSIS — I10 Essential (primary) hypertension: Secondary | ICD-10-CM

## 2011-09-28 DIAGNOSIS — M6240 Contracture of muscle, unspecified site: Secondary | ICD-10-CM

## 2011-09-28 DIAGNOSIS — Z794 Long term (current) use of insulin: Secondary | ICD-10-CM

## 2011-09-28 DIAGNOSIS — M722 Plantar fascial fibromatosis: Secondary | ICD-10-CM | POA: Insufficient documentation

## 2011-09-28 DIAGNOSIS — E109 Type 1 diabetes mellitus without complications: Secondary | ICD-10-CM

## 2011-09-28 DIAGNOSIS — E119 Type 2 diabetes mellitus without complications: Secondary | ICD-10-CM

## 2011-09-28 LAB — BASIC METABOLIC PANEL WITH GFR
BUN: 47 mg/dL — ABNORMAL HIGH (ref 6–23)
CO2: 25 mEq/L (ref 19–32)
Calcium: 9.3 mg/dL (ref 8.4–10.5)
Chloride: 106 mEq/L (ref 96–112)
Creat: 1.53 mg/dL — ABNORMAL HIGH (ref 0.50–1.10)
GFR, Est African American: 42 mL/min — ABNORMAL LOW
GFR, Est Non African American: 36 mL/min — ABNORMAL LOW
Glucose, Bld: 108 mg/dL — ABNORMAL HIGH (ref 70–99)
Potassium: 4.5 mEq/L (ref 3.5–5.3)
Sodium: 141 mEq/L (ref 135–145)

## 2011-09-28 LAB — GLUCOSE, CAPILLARY: Glucose-Capillary: 121 mg/dL — ABNORMAL HIGH (ref 70–99)

## 2011-09-28 LAB — POCT GLYCOSYLATED HEMOGLOBIN (HGB A1C): Hemoglobin A1C: 7.6

## 2011-09-28 MED ORDER — INSULIN LISPRO 100 UNIT/ML ~~LOC~~ SOLN: 5.0000 [IU] | Freq: Three times a day (TID) | SUBCUTANEOUS | Status: DC

## 2011-09-28 MED ORDER — INSULIN GLARGINE 100 UNIT/ML ~~LOC~~ SOLN: 60.0000 [IU] | Freq: Every day | SUBCUTANEOUS | Status: DC

## 2011-09-28 NOTE — Patient Instructions (Addendum)
Please decrease your lantus dose to 60 units at bedtime.  I have written a prescription for short acting (meal coverage) insulin for your to give yourself before breakfast, lunch and dinner.   Please decrease your dose of ibuprofen until you are completely off by the next 2 weeks. We are giving you a referral to podiatry  The proper technique for using your inhaler is as follows: Take a deep breath in and exhale completely. With you next deep breath in, activate your inhaler and inhale the medication as deeply as you can. Hold this breath in for 7 full seconds. This counts as one puff.  If any of your lab results are abnormal we will contact you by phone or send you a letter. If they are normal, we will not contact you, but will be happy to discuss them at your next clinic appointment.  Return to clinic to see your PCP in 3 months.  Please bring all your medications to your next clinic appointment.

## 2011-09-28 NOTE — Progress Notes (Signed)
Addended by: Augustin Coupe C on: 09/28/2011 05:20 PM   Modules accepted: Orders

## 2011-09-28 NOTE — Assessment & Plan Note (Addendum)
Overall the patient's A1c has been stable in the mid sevens. This is above goal of less than 7. Her Lantus dose is too high as she is having morning hypoglycemia. Therefore in order to achieve better control we will add meal coverage. I will decrease her Lantus dose to 60 units each bedtime. I will provide her insulin Lis pro pen. She will give herself 5 units before breakfast lunch and dinner. I instructed her that if she skips a meal she should not give herself short acting insulin. We will titrate up her short acting insulin as necessary. We'll also check urine microalbumin creatinine ratio. Unfortunately we will have to get prior authorization for a short acting insulin pen. He'll enter this order in once we have authorization.

## 2011-09-28 NOTE — Progress Notes (Signed)
Subjective:     Patient ID: Ann [name], female   DOB: March 10, 1951, 61 y.o.   MRN: [id number]  Diabetes She presents for her follow-up diabetic visit. She has type 2 diabetes mellitus. Her disease course has been stable. Hypoglycemia symptoms include dizziness, mood changes, nervousness/anxiousness, sleepiness, speech difficulty and tremors. Pertinent negatives for hypoglycemia include no confusion, headaches, seizures or sweats. Associated symptoms include foot paresthesias. Pertinent negatives for diabetes include no blurred vision, no chest pain, no fatigue, no foot ulcerations, no polydipsia, no polyphagia, no polyuria, no visual change and no weakness. Pertinent negatives for hypoglycemia complications include no blackouts. Symptoms are stable. Diabetic complications include peripheral neuropathy. Risk factors for coronary artery disease include diabetes mellitus and dyslipidemia. Current diabetic treatment includes insulin injections and diet. She is compliant with treatment most of the time. She is currently taking insulin at bedtime (She only uses 70 units of Lantus at bedtime. She has no other coverage). Insulin injections are given by patient. She is following a diabetic diet. She rarely participates in exercise. She monitors blood glucose at home 3-4 x per day. Her home blood glucose trend is fluctuating minimally. (She states that approximately 2-3 times per week she will have morning hypoglycemia to the 50s and 60s unless she eats a late night snack. Unfortunately she did not bring her meter today. She states she does have overt symptoms of hypoglycemia approximately monthly.) An ACE inhibitor/angiotensin II receptor blocker is being taken. She does not see a podiatrist.Eye exam is current.  Foot Injury  The incident occurred 5 to 7 days ago (Pain started 1 week ago. She went to the ED for eval and was diagnosed with plantar fasciitis. ). The incident occurred at home. There was no injury mechanism.  The pain is present in the right foot (had burning, itching sensation in the bottom of the right foot. Then foot started swelling on the bottom). The quality of the pain is described as burning and cramping. The pain is at a severity of 2/10. The pain has been improving (Pain now only hurst when walking and when she pivots) since onset. Associated symptoms include tingling. Pertinent negatives include no inability to bear weight, loss of motion, loss of sensation, muscle weakness or numbness. The symptoms are aggravated by weight bearing. She has tried ice, NSAIDs and rest (cortisone injections helped a lot. Taking ibuprofen 600 mg Q6h which also helps) for the symptoms. The treatment provided significant (She would liek a referral to a podiatrist) relief.     Review of Systems  Constitutional: Negative for fever, chills and fatigue.  Eyes: Negative for blurred vision and visual disturbance.  Respiratory: Negative for cough and shortness of breath.   Cardiovascular: Negative for chest pain and leg swelling.  Gastrointestinal: Negative for abdominal pain, diarrhea and constipation.  Genitourinary: Negative for dysuria and polyuria.  Musculoskeletal:       Foot pain  Skin: Negative.   Neurological: Positive for dizziness, tingling, tremors and speech difficulty. Negative for seizures, weakness, numbness and headaches.  Hematological: Negative for polydipsia and polyphagia.  Psychiatric/Behavioral: Negative for confusion. The patient is nervous/anxious.        Objective:   Physical Exam  Vitals reviewed. Constitutional: She appears well-developed and well-nourished. No distress.  HENT:  Right Ear: External ear normal.  Left Ear: External ear normal.  Cardiovascular: Normal rate, regular rhythm and intact distal pulses.  Exam reveals friction rub. Exam reveals no gallop.   No murmur heard. Pulmonary/Chest: Effort normal and breath  sounds normal. No respiratory distress. She has no wheezes.  She has no rales.  Musculoskeletal: Normal range of motion. She exhibits tenderness. She exhibits no edema.       Right foot: She exhibits tenderness. She exhibits normal range of motion, no swelling, normal capillary refill, no crepitus, no deformity and no laceration.       Left foot: Normal. She exhibits normal range of motion, no tenderness, no bony tenderness, no swelling, normal capillary refill, no deformity and no laceration.       Right foot there is tenderness to palpation on the anterior portion of the calcaneus (plantar surface of the foot)  Neurological: She is alert. She has normal strength. No sensory deficit.  Skin: Skin is warm and dry. No rash noted. She is not diaphoretic. No erythema. No pallor.  Psychiatric: She has a normal mood and affect.       Assessment:     Please see problem oriented charting for assessment and plan by problem (best viewed under encounters tab).

## 2011-09-28 NOTE — Assessment & Plan Note (Signed)
Patient does seem to have a plantar fasciitis of the right foot. The burning, cramping nature the pain is on the plantar surface is very consistent. She also has a tenderness on the area of insertion of the plantar fascia. Overall I am happy that she had relief with cortisone injection. Her ibuprofen use though is concerning given her renal dysfunction. I will refer her to podiatry for further management of her feet. I will also ask her to decrease her ibuprofen use so that she is tapered off by the next 1-2 weeks.

## 2011-09-28 NOTE — Assessment & Plan Note (Signed)
Patient's baseline creatinine is approximately 1.8.  Overall her diabetes could use some better control. Hypertension is well-controlled and does not require modification. However, with her recent increase in ibuprofen usage I am concerned for her renal function. We will check basic metabolic panel and urine micro-albumin/creatinine ratio

## 2011-09-29 ENCOUNTER — Telehealth: Payer: Self-pay | Admitting: Internal Medicine

## 2011-09-29 DIAGNOSIS — E109 Type 1 diabetes mellitus without complications: Secondary | ICD-10-CM

## 2011-09-29 LAB — MICROALBUMIN / CREATININE URINE RATIO
Creatinine, Urine: 61.2 mg/dL
Microalb Creat Ratio: 239.5 mg/g — ABNORMAL HIGH (ref 0.0–30.0)
Microalb, Ur: 14.66 mg/dL — ABNORMAL HIGH (ref 0.00–1.89)

## 2011-09-29 MED ORDER — INSULIN ASPART 100 UNIT/ML ~~LOC~~ SOLN: 5.0000 [IU] | Freq: Three times a day (TID) | SUBCUTANEOUS | Status: DC

## 2011-09-29 NOTE — Telephone Encounter (Signed)
I called the patient to tell her about her lab results. Overall her creatinine is going down, which is good. We do not have a baseline for her microalbumin to creatinine ratio however this is not unexpectedly high for someone with baseline renal dysfunction. Her estimated 24-hour urine excretion as measured by her microalbumin creatinine ratio places her in the moderate albuminuria category.    Microalb, Ur 0.00 - 1.89 mg/dL 14.66 (H)          Creatinine, Urine mg/dL 61.2           84.1      Microalb Creat Ratio 0.0 - 30.0 mg/g 239.5 (H)        Lab Results  Component Value Date   CREATININE 1.53* 09/28/2011   BUN 47* 09/28/2011   NA 141 09/28/2011   K 4.5 09/28/2011   CL 106 09/28/2011   CO2 25 09/28/2011   I also called to discuss the patient's short acting insulin. Medicaid will only pay for the insulin pens if we undergo preauthorization. I believe that the complexity of adding 3 times a day insulin will be detrimental to describe achieving her insulin goals and that an insulin pen will greatly increase our likelihood of success.

## 2011-09-29 NOTE — Telephone Encounter (Signed)
Called patient to inform her that I had written her for a NovoLog flex pen 5 units 3 times a day a.c. Told her that she should skip insulin if she does not eat a meal. She should continue to take her Lantus as we discussed at her previous visit. Also placed a referral for her to meet with Debera Lat our diabetes educator. This appointment has already been set up.

## 2011-10-18 ENCOUNTER — Ambulatory Visit (INDEPENDENT_AMBULATORY_CARE_PROVIDER_SITE_OTHER): Payer: Medicaid Other | Admitting: Dietician

## 2011-10-18 DIAGNOSIS — E109 Type 1 diabetes mellitus without complications: Secondary | ICD-10-CM

## 2011-10-18 NOTE — Patient Instructions (Addendum)
Please read the sheet about carbs and talk to your daughter about it.  Please make a follow up in 3-5 weeks and bring meters.  A prescription will be sent for your new meter ( accu chek Nano) to [facility].  Call with any questions- Butch Penny [phone]

## 2011-10-18 NOTE — Progress Notes (Signed)
Diabetes Self-Management Training (DSMT)  Initial Visit  10/18/2011 [name], identified by name and date of birth, is a 61 y.o. female with Type 2 Diabetes. Year of diabetes diagnosis: 1980 Other persons present: patient allowed nursing students to sit in on visit  ASSESSMENT Patient concerns are Nutrition/meal planning, Monitoring, Healthy Lifestyle and Glycemic control.  There were no vitals taken for this visit. There is no height or weight on file to calculate BMI. Lab Results  Component Value Date   LDLCALC 80 12/05/2010   Lab Results  Component Value Date   HGBA1C 7.6 09/28/2011   Labs reviewed.  Family history of diabetes: Yes Support systems: friends, family Special needs: None Patients belief/attitude about diabetes: Diabetes can be controlled. Self foot exams daily: Yes Diabetes Complications: None Prior DM Education: Yes- a while back   Medications See Medications list. Is taking as prescribed. Reports she has nocturnal hypoglycemia(60s)  if she doesn't eat snack on 60 units lantus. 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   Self-Monitoring Medication Nutrition Monitor: gave patient an accu-chek nano meter today because she wants to have a meter that marks after meal blood sugars Frequency of testing: 6-8 times/day- more since med change Breakfast: 90-150- which it is today after coffee with splenda, she did not bring her meter but reports her blood sugars have been higher since steroid injection in foot and have gotten back to controled yesterday between 90-150. Mostly higher in Pms  Hyperglycemia: Yes Rarely Hypoglycemia: Yes Weekly   Meal Planning Some knowledge and Interested in improving   Assessment comments: patient very interested in self care. Began education on carb counting/consistency today. Patient to return to continue education and review blood sugars   INDIVIDUAL DIABETES EDUCATION PLAN:  Nutrition management Medication Monitoring _______________________________________________________________________  Intervention TOPICS COVERED TODAY:  Nutrition management  Role of diet in the treatment of diabetes and the relationship between the three main macronutritents and blood glucose control. Medication  Reviewed patients medication for diabetes, action, purpose, timing of dose and side effects. Monitoring  Taught/evaluated SMBG with accu check smartview meter. Purpose and frequency of SMBG. Taught/discussed recording of test results and interpretation of SMBG. Interpreting lab values - A1C, lipid, urine microalbumina.  PATIENTS GOALS/PLAN (copy and paste in patient instructions so patient receives a copy): 1.  Learning Objective:       State how carbs affect blood sugar 2.  Behavioral Objective:         Monitoring: To identify blood glucose trends, I will test my blood glucose 3x  day Never 0%  Personalized Follow-Up Plan for Ongoing Self Management Support:  [place], church, friends, family and CDE visits ______________________________________________________________________   Outcomes Expected outcomes: Demonstrated interest in learning.Expect positive changes in lifestyle. Self-care Barriers: Lack of transportation, Lack of material resources Education material provided: yes Patient to contact team via Phone if problems or questions.  Time in: 1000     Time out: 1100  Future DSMT - 4-6 wks   Ann [name], Butch Penny

## 2011-11-06 ENCOUNTER — Other Ambulatory Visit: Payer: Self-pay | Admitting: Orthopaedic Surgery

## 2011-11-07 ENCOUNTER — Encounter: Payer: Self-pay | Admitting: Internal Medicine

## 2011-11-07 ENCOUNTER — Telehealth: Payer: Self-pay | Admitting: Dietician

## 2011-11-07 DIAGNOSIS — E109 Type 1 diabetes mellitus without complications: Secondary | ICD-10-CM

## 2011-11-07 MED ORDER — GLUCOSE BLOOD VI STRP: ORAL_STRIP | Status: DC

## 2011-11-07 MED ORDER — ACCU-CHEK FASTCLIX LANCETS MISC: 1.0000 | Freq: Four times a day (QID) | Status: DC

## 2011-11-07 NOTE — Telephone Encounter (Signed)
Needs testing supplies. Request rx be sent to [facility]

## 2011-11-08 ENCOUNTER — Ambulatory Visit (INDEPENDENT_AMBULATORY_CARE_PROVIDER_SITE_OTHER): Payer: Medicaid Other | Admitting: Dietician

## 2011-11-08 ENCOUNTER — Other Ambulatory Visit: Payer: Self-pay | Admitting: Internal Medicine

## 2011-11-08 VITALS — Ht 63.0 in | Wt 253.9 lb

## 2011-11-08 DIAGNOSIS — E109 Type 1 diabetes mellitus without complications: Secondary | ICD-10-CM

## 2011-11-08 MED ORDER — INSULIN GLARGINE 100 UNIT/ML ~~LOC~~ SOLN: 55.0000 [IU] | Freq: Every day | SUBCUTANEOUS | Status: DC

## 2011-11-08 NOTE — Patient Instructions (Addendum)
Please decrease your Lantus insulin to 55 units each night.  Use meal plan of 30-45 grams carbs  per meal and up to 15 grams carb  for snacks.  Let's make a follow in same day you see your new doctor in August.

## 2011-11-08 NOTE — Progress Notes (Signed)
See meter download for more detail.

## 2011-11-08 NOTE — Progress Notes (Signed)
129 this am 200 later in day 88 was lowest  Fish chicken [place]  24 hr recall: CBG 88 5 units 8-9 am-8 0zfruit flavored protein soy drink- chai tea smoothie CBG 5 units 1-2 Pm 8 oz veggeie smoothie  CBG 5 units 5-6 Pm bowl Fried Sweet potato balls and water  Diabetes Self-Management Training (DSMT)  Initial Visit  11/08/2011 Ms. Kaley Hyslop, identified by name and date of birth, is a 61 y.o. female with Type 2 Diabetes. Year of diabetes diagnosis: 1980  ASSESSMENT Patient concerns are Nutrition/meal planning, weight loss, Healthy Lifestyle and Glycemic control. She has been trying to eat healthier, chicken fish [place], baked boiled not fried. More vegetables and fruits. Family is supportive.   Height 5\' 3"  (1.6 m), weight 253 lb 14.4 oz (115.168 kg). Body mass index is 44.98 kg/(m^2). Lab Results  Component Value Date   LDLCALC 80 12/05/2010   Lab Results  Component Value Date   HGBA1C 7.6 09/28/2011   Labs reviewed.  Support systems: friends, family Special needs: simple education material Patients belief/attitude about diabetes: Diabetes can be controlled. Diabetes Complications: went to eye doctor- has retinopathy Prior DM Education: Yes- a while back   Medications See Medications list. Is taking as prescribed. 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 Knee surgery August 6th   Self-Monitoring Monitor: Frequency of testing: 6-8 times/day- more since med change Breakfast: Hyperglycemia: Yes Rarely Hypoglycemia: Yes Weekly   Meal Planning Some knowledge and Interested in improving  Assessment comments: patient very interested in self care. Began education on carb counting/consistency today. Patient to return to continue education and review blood sugars   INDIVIDUAL DIABETES EDUCATION PLAN:  Nutrition  management Medication Monitoring _______________________________________________________________________  Intervention TOPICS COVERED TODAY:  Nutrition management  Role of diet in the treatment of diabetes and the relationship between the three main macronutritents and blood glucose control. Began education on carb consistency Medication  Reviewed patients medication for diabetes, action, purpose, timing of dose and side effects. Monitoring  Education about how to read meter download...  PATIENTS GOALS/PLAN (copy and paste in patient instructions so patient receives a copy): 1.  Learning Objective:       State how carbs affect blood sugar 2.  Behavioral Objective:         Monitoring: To identify blood glucose trends, I will test my blood glucose 3x  day average is 2.1x per day 75%            Meal  Planning I will try to eat a consistent amount of carbs for meal and snacks 25%  Personalized Follow-Up Plan for Ongoing Self Management Support:  [place], church, friends, family and CDE visits ______________________________________________________________________   Outcomes Expected outcomes: Demonstrated interest in learning.Expect positive changes in lifestyle. Self-care Barriers: Lack of transportation, Lack of material resources Education material provided: yes Patient to contact team via Phone if problems or questions.  Time in: 1100     Time out: 1150  Future DSMT - 4-6 wks   [place], Butch Penny

## 2011-11-13 ENCOUNTER — Encounter (HOSPITAL_COMMUNITY): Payer: Self-pay | Admitting: Respiratory Therapy

## 2011-11-21 ENCOUNTER — Encounter (HOSPITAL_COMMUNITY)
Admission: RE | Admit: 2011-11-21 | Discharge: 2011-11-21 | Disposition: A | Discharge status: Home / Self Care | Payer: Medicaid Other | Source: Ambulatory Visit | Attending: Orthopaedic Surgery | Admitting: Orthopaedic Surgery

## 2011-11-21 ENCOUNTER — Encounter (HOSPITAL_COMMUNITY): Payer: Self-pay

## 2011-11-21 HISTORY — DX: Angina pectoris, unspecified: I20.9

## 2011-11-21 HISTORY — DX: Gastro-esophageal reflux disease without esophagitis: K21.9

## 2011-11-21 HISTORY — DX: Shortness of breath: R06.02

## 2011-11-21 HISTORY — DX: Sleep apnea, unspecified: G47.30

## 2011-11-21 LAB — DIFFERENTIAL
Basophils Absolute: 0 10*3/uL (ref 0.0–0.1)
Basophils Relative: 0 % (ref 0–1)
Eosinophils Absolute: 0.1 10*3/uL (ref 0.0–0.7)
Eosinophils Relative: 2 % (ref 0–5)
Lymphocytes Relative: 33 % (ref 12–46)
Lymphs Abs: 2.2 10*3/uL (ref 0.7–4.0)
Monocytes Absolute: 0.5 10*3/uL (ref 0.1–1.0)
Monocytes Relative: 7 % (ref 3–12)
Neutro Abs: 3.8 10*3/uL (ref 1.7–7.7)
Neutrophils Relative %: 58 % (ref 43–77)

## 2011-11-21 LAB — URINALYSIS, ROUTINE W REFLEX MICROSCOPIC
Bilirubin Urine: NEGATIVE
Glucose, UA: NEGATIVE mg/dL
Hgb urine dipstick: NEGATIVE
Ketones, ur: NEGATIVE mg/dL
Leukocytes, UA: NEGATIVE
Nitrite: NEGATIVE
Protein, ur: NEGATIVE mg/dL
Specific Gravity, Urine: 1.016 (ref 1.005–1.030)
Urobilinogen, UA: 0.2 mg/dL (ref 0.0–1.0)
pH: 5 (ref 5.0–8.0)

## 2011-11-21 LAB — SURGICAL PCR SCREEN
MRSA, PCR: NEGATIVE
Staphylococcus aureus: NEGATIVE

## 2011-11-21 LAB — CBC
HCT: 32.6 % — ABNORMAL LOW (ref 36.0–46.0)
Hemoglobin: 10.7 g/dL — ABNORMAL LOW (ref 12.0–15.0)
MCH: 28.2 pg (ref 26.0–34.0)
MCHC: 32.8 g/dL (ref 30.0–36.0)
MCV: 85.8 fL (ref 78.0–100.0)
Platelets: 238 10*3/uL (ref 150–400)
RBC: 3.8 MIL/uL — ABNORMAL LOW (ref 3.87–5.11)
RDW: 13.9 % (ref 11.5–15.5)
WBC: 6.6 10*3/uL (ref 4.0–10.5)

## 2011-11-21 LAB — BASIC METABOLIC PANEL
BUN: 31 mg/dL — ABNORMAL HIGH (ref 6–23)
CO2: 23 mEq/L (ref 19–32)
Calcium: 9.4 mg/dL (ref 8.4–10.5)
Chloride: 101 mEq/L (ref 96–112)
Creatinine, Ser: 1.83 mg/dL — ABNORMAL HIGH (ref 0.50–1.10)
GFR calc Af Amer: 33 mL/min — ABNORMAL LOW (ref >90)
GFR calc non Af Amer: 28 mL/min — ABNORMAL LOW (ref >90)
Glucose, Bld: 81 mg/dL (ref 70–99)
Potassium: 3.9 mEq/L (ref 3.5–5.1)
Sodium: 137 mEq/L (ref 135–145)

## 2011-11-21 LAB — APTT: aPTT: 31 seconds (ref 24–37)

## 2011-11-21 LAB — PROTIME-INR
INR: 1.05 (ref 0.00–1.49)
Prothrombin Time: 13.9 seconds (ref 11.6–15.2)

## 2011-11-21 NOTE — Progress Notes (Signed)
Office called about consent, consent for arthroscopy ,pt for arthroplasty.

## 2011-11-21 NOTE — Pre-Procedure Instructions (Signed)
Hand  11/21/2011   Your procedure is scheduled on:  11-28-2011  Report to [facility] at 5:30 AM.  Call this number if you have problems the morning of surgery: [phone]   Remember:   Do not eat food or drink:After Midnight.      Take these medicines the morning of surgery with A SIP OF WATER: inhaler as needed,Amlodipine(Norvasc),Isosorbide<Metoprolol(Lopressor)pantoprazole(Protonix)   Do not wear jewelry, make-up or nail polish.  Do not wear lotions, powders, or perfumes. You may wear deodorant.  Do not shave 48 hours prior to surgery. Men may shave face and neck.  Do not bring valuables to the hospital.  Contacts, dentures or bridgework may not be worn into surgery.  Leave suitcase in the car. After surgery it may be brought to your room.  For patients admitted to the hospital, checkout time is 11:00 AM the day of discharge.     Special Instructions: Incentive Spirometry - Practice and bring it with you on the day of surgery. and CHG Shower Use Special Wash: 1/2 bottle night before surgery and 1/2 bottle morning of surgery.   Please read over the following fact sheets that you were given: Pain Booklet, Coughing and Deep Breathing, Blood Transfusion Information, MRSA Information and Surgical Site Infection Prevention

## 2011-11-21 NOTE — Progress Notes (Signed)
Requested OV and EKG from [facility]. Sleep Study also requested from [facility].

## 2011-11-22 ENCOUNTER — Other Ambulatory Visit: Payer: Self-pay | Admitting: Orthopaedic Surgery

## 2011-11-22 NOTE — Consult Note (Signed)
Anesthesia chart review: Patient is a 61 year old female posted for right total knee arthroplasty by Dr. Rhona Raider on 11/28/2011.  History includes morbid obesity with BMI of 44, nonsmoker, hyperlipidemia, asthma, diabetes mellitus, on insulin, obstructive sleep apnea with CPAP, GERD, chronic kidney disease stage III (baseline Cr 1.8 per Dr. Rosine Door notes), chronic shortness of breath, hypertension, anemia, coronary artery disease s/p PCI RCA in [place].    Her Cardiologist is Dr. Gwenlyn [facility]).  He recommended a nuclear stress test, which was done on 10/27/2011 and showed normal myocardial perfusion with an EF of 58%.  Echo on 09/28/08 showed: 1. Left ventricle: The cavity size was normal. Systolic function was normal. The estimated ejection fraction was in the range of 55% to 60%. Doppler parameters are consistent with abnormal left ventricular relaxation (grade 1 diastolic dysfunction). 2. Aortic valve: Mild diffuse sclerosis without stenosis. 3. Mitral valve: Mild regurgitation.  Cardiac cath on 03/08/09 showed normal left main, LAD with 40-50% proximal and distal stenosis, AV groove circumflex, nondominant and diffusely diseased and gave off high first marginal ramus distribution that was fairly serpiginous with 75% stenosis on the bend in the proximal third, RCA with patent stent in the midportion, as well as in the proximal PDA with 30% scattered stenosis in between. EF greater than 60%. Medical therapy was recommended.  EKG on 11/21/2011 showed sinus rhythm with PACs, minimal voltage criteria for LVH.  Chest x-ray on 03/08/2011 showed no active disease.  Labs noted.  BUN 31, Cr 1.83, K 3.9, glucose 81.  H/H 10.7/32.6, PLT 238. PT/PTT WNL.  Her Cr has been 1.53-1.83 since at least 12/05/10, so appears to be within her baseline.  Dr. Rhona Raider can follow renal function post-operatively.  Anticipate she can proceed as planned.    Myra Gianotti, PA-C

## 2011-11-24 NOTE — H&P (Signed)
Ann [name] is an 61 y.o. female.   Chief Complaint: right knee pain HPI: Ann [name] is a patient of ours who is complaining of increasing right knee pain.  She is having pain with every step and having trouble sleeping at nighttime comfortably because of right knee pain.  She has been on ibuprofen and has had one corticosteroid injection which failed to relieve her discomfort.  She is having to limit her activities which makes her unhappy.  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  We have discussed with her proceeding on with a right total knee replacement.  Past Medical History  Diagnosis Date  . Hyperlipidemia 10/21/2009  . Shoulder pain, left 03/16/2009    occurs at night  . Lumbar back pain 02/16/2009    states pain goes down right leg  . Myalgia 02/16/2009  . Chest pain 02/16/2009  . Asthma 09/29/2008  . Anemia 09/29/2008    anemia of chronic disease  . Insulin dependent diabetes mellitus type IA 09/29/2008  . Hypertension 09/29/2008  . CAD (coronary artery disease) 09/29/2008    Stress test 10/2011 nl with EF 58%. [place] card.  . Shortness of breath     with exerction  . Sleep apnea     CPAP  . Chronic kidney disease (CKD), stage III (moderate) 10/20/2009  . GERD (gastroesophageal reflux disease)   . Anginal pain     no recent chest pain    Past Surgical History  Procedure Date  . Appendectomy   . Cholecystectomy   . Total knee arthroplasty     Left  . Abdominal hysterectomy   . Joint replacement     left knee    No family history on file. Social History:  reports that she has never smoked. Her smokeless tobacco use includes Snuff. She reports that she does not drink alcohol or use illicit drugs.  Allergies:  Allergies  Allergen Reactions  . Codeine     REACTION: nausea and vomiting  . Metronidazole     REACTION: itching    No prescriptions prior to admission    No results found for this or any previous visit (from the past 48  hour(s)). No results found.  Review of Systems  Constitutional: Negative.   HENT: Negative.   Eyes: Negative.   Respiratory: Negative.        History of asthma  Cardiovascular: Negative.        History of MI and stent placement  Gastrointestinal: Negative.   Genitourinary: Negative.   Musculoskeletal: Negative.   Skin: Negative.   Neurological: Negative.   Endo/Heme/Allergies: Negative.        Positive for diabetes  Psychiatric/Behavioral: Negative.     There were no vitals taken for this visit. Physical Exam  Constitutional: She appears well-nourished.  HENT:  Head: Atraumatic.  Eyes: Pupils are equal, round, and reactive to light.  Neck: Normal range of motion.  Cardiovascular: Normal rate and normal heart sounds.   Respiratory: Effort normal.  GI: Bowel sounds are normal.  Musculoskeletal:       Right knee exam: Walks with a slightly altered gait.  Pain along the right knee medial joint line.  Range of motion 0-110.  Mild crepitation with range of motion.  No significant effusion.  Neurological: She is alert.  Skin: Skin is warm.     Assessment/Plan Assessment: Right knee end-stage DJD. Plan: Ann [name] has tried oral anti-inflammatory medicines and corticosteroid injections and has bone-on-bone degeneration in her right knee.  Her activities are severely limited and she is having night pain.  We have discussed with her proceeding with a right total knee replacement with the risks of anesthesia, infection, DVT, PE and death related to any replacement operation.  She will be in the hospital for 2-3 days and if things go well discharged home if she needs skilled nursing facility placement this can't be accommodated as well.  Ann [name] R 11/24/2011, 12:35 PM

## 2011-11-27 MED ORDER — LACTATED RINGERS IV SOLN
INTRAVENOUS | Status: DC
  Administered 2011-11-28: 12:00:00 via INTRAVENOUS

## 2011-11-27 MED ORDER — CHLORHEXIDINE GLUCONATE 4 % EX LIQD: 60.0000 mL | Freq: Once | CUTANEOUS | Status: DC

## 2011-11-27 MED ORDER — CEFAZOLIN SODIUM-DEXTROSE 2-3 GM-% IV SOLR
2.0000 g | INTRAVENOUS | Status: AC
  Administered 2011-11-28: 2 g via INTRAVENOUS
  Filled 2011-11-27: qty 50

## 2011-11-28 ENCOUNTER — Encounter (HOSPITAL_COMMUNITY): Payer: Self-pay | Admitting: Vascular Surgery

## 2011-11-28 ENCOUNTER — Inpatient Hospital Stay (HOSPITAL_COMMUNITY)
Admission: RE | Admit: 2011-11-28 | Discharge: 2011-12-02 | DRG: 470 | Disposition: A | Discharge status: Home / Self Care | Payer: Medicaid Other | Source: Ambulatory Visit | Attending: Orthopaedic Surgery | Admitting: Orthopaedic Surgery

## 2011-11-28 ENCOUNTER — Encounter (HOSPITAL_COMMUNITY)
Admission: RE | Disposition: A | Discharge status: Home / Self Care | Payer: Self-pay | Source: Ambulatory Visit | Attending: Orthopaedic Surgery

## 2011-11-28 ENCOUNTER — Inpatient Hospital Stay (HOSPITAL_COMMUNITY): Payer: Medicaid Other | Admitting: Vascular Surgery

## 2011-11-28 ENCOUNTER — Encounter (HOSPITAL_COMMUNITY): Payer: Self-pay | Admitting: *Deleted

## 2011-11-28 DIAGNOSIS — D62 Acute posthemorrhagic anemia: Secondary | ICD-10-CM | POA: Diagnosis not present

## 2011-11-28 DIAGNOSIS — Z9861 Coronary angioplasty status: Secondary | ICD-10-CM

## 2011-11-28 DIAGNOSIS — Z9089 Acquired absence of other organs: Secondary | ICD-10-CM

## 2011-11-28 DIAGNOSIS — J45909 Unspecified asthma, uncomplicated: Secondary | ICD-10-CM | POA: Diagnosis present

## 2011-11-28 DIAGNOSIS — Z885 Allergy status to narcotic agent status: Secondary | ICD-10-CM

## 2011-11-28 DIAGNOSIS — M1710 Unilateral primary osteoarthritis, unspecified knee: Secondary | ICD-10-CM

## 2011-11-28 DIAGNOSIS — N183 Chronic kidney disease, stage 3 unspecified: Secondary | ICD-10-CM | POA: Diagnosis present

## 2011-11-28 DIAGNOSIS — Z90710 Acquired absence of both cervix and uterus: Secondary | ICD-10-CM

## 2011-11-28 DIAGNOSIS — E785 Hyperlipidemia, unspecified: Secondary | ICD-10-CM | POA: Diagnosis present

## 2011-11-28 DIAGNOSIS — K219 Gastro-esophageal reflux disease without esophagitis: Secondary | ICD-10-CM | POA: Diagnosis present

## 2011-11-28 DIAGNOSIS — F17200 Nicotine dependence, unspecified, uncomplicated: Secondary | ICD-10-CM | POA: Diagnosis present

## 2011-11-28 DIAGNOSIS — Z794 Long term (current) use of insulin: Secondary | ICD-10-CM

## 2011-11-28 DIAGNOSIS — I2510 Atherosclerotic heart disease of native coronary artery without angina pectoris: Secondary | ICD-10-CM | POA: Diagnosis present

## 2011-11-28 DIAGNOSIS — M179 Osteoarthritis of knee, unspecified: Secondary | ICD-10-CM | POA: Diagnosis present

## 2011-11-28 DIAGNOSIS — G4730 Sleep apnea, unspecified: Secondary | ICD-10-CM | POA: Diagnosis present

## 2011-11-28 DIAGNOSIS — I129 Hypertensive chronic kidney disease with stage 1 through stage 4 chronic kidney disease, or unspecified chronic kidney disease: Secondary | ICD-10-CM | POA: Diagnosis present

## 2011-11-28 DIAGNOSIS — E119 Type 2 diabetes mellitus without complications: Secondary | ICD-10-CM | POA: Diagnosis present

## 2011-11-28 DIAGNOSIS — I252 Old myocardial infarction: Secondary | ICD-10-CM

## 2011-11-28 HISTORY — PX: TOTAL KNEE ARTHROPLASTY: SHX125

## 2011-11-28 LAB — GLUCOSE, CAPILLARY
Glucose-Capillary: 82 mg/dL (ref 70–99)
Glucose-Capillary: 116 mg/dL — ABNORMAL HIGH (ref 70–99)
Glucose-Capillary: 132 mg/dL — ABNORMAL HIGH (ref 70–99)
Glucose-Capillary: 144 mg/dL — ABNORMAL HIGH (ref 70–99)
Glucose-Capillary: 126 mg/dL — ABNORMAL HIGH (ref 70–99)

## 2011-11-28 SURGERY — ARTHROPLASTY, KNEE, TOTAL
Anesthesia: General | Site: Knee | Laterality: Right | Wound class: Clean

## 2011-11-28 MED ORDER — DEXTROSE 5 % IV SOLN
INTRAVENOUS | Status: DC | PRN
  Administered 2011-11-28 (×2): via INTRAVENOUS

## 2011-11-28 MED ORDER — CELECOXIB 200 MG PO CAPS
200.0000 mg | ORAL_CAPSULE | Freq: Every day | ORAL | Status: DC
  Administered 2011-11-29 – 2011-12-02 (×4): 200 mg via ORAL
  Filled 2011-11-28 (×4): qty 1

## 2011-11-28 MED ORDER — ONDANSETRON HCL 4 MG/2ML IJ SOLN: 4.0000 mg | Freq: Once | INTRAMUSCULAR | Status: DC | PRN

## 2011-11-28 MED ORDER — ROCURONIUM BROMIDE 100 MG/10ML IV SOLN
INTRAVENOUS | Status: DC | PRN
  Administered 2011-11-28: 70 mg via INTRAVENOUS

## 2011-11-28 MED ORDER — LISINOPRIL 40 MG PO TABS
40.0000 mg | ORAL_TABLET | Freq: Every day | ORAL | Status: DC
  Administered 2011-11-29 – 2011-12-02 (×4): 40 mg via ORAL
  Filled 2011-11-28 (×4): qty 1

## 2011-11-28 MED ORDER — SODIUM CHLORIDE 0.9 % IR SOLN
Status: DC | PRN
  Administered 2011-11-28: 3000 mL

## 2011-11-28 MED ORDER — ONDANSETRON HCL 4 MG PO TABS: 4.0000 mg | ORAL_TABLET | Freq: Four times a day (QID) | ORAL | Status: DC | PRN

## 2011-11-28 MED ORDER — ASPIRIN EC 325 MG PO TBEC
325.0000 mg | DELAYED_RELEASE_TABLET | Freq: Two times a day (BID) | ORAL | Status: DC
  Administered 2011-11-28 – 2011-12-02 (×7): 325 mg via ORAL
  Filled 2011-11-28 (×10): qty 1

## 2011-11-28 MED ORDER — METOPROLOL TARTRATE 50 MG PO TABS
50.0000 mg | ORAL_TABLET | Freq: Two times a day (BID) | ORAL | Status: DC
  Administered 2011-11-28 – 2011-12-02 (×8): 50 mg via ORAL
  Filled 2011-11-28 (×10): qty 1

## 2011-11-28 MED ORDER — INSULIN GLARGINE 100 UNIT/ML ~~LOC~~ SOLN
50.0000 [IU] | Freq: Every day | SUBCUTANEOUS | Status: DC
  Administered 2011-11-29 – 2011-12-01 (×3): 50 [IU] via SUBCUTANEOUS

## 2011-11-28 MED ORDER — HYDROMORPHONE HCL PF 1 MG/ML IJ SOLN
INTRAMUSCULAR | Status: AC
  Filled 2011-11-28: qty 1

## 2011-11-28 MED ORDER — HYDROCHLOROTHIAZIDE 25 MG PO TABS
25.0000 mg | ORAL_TABLET | Freq: Every day | ORAL | Status: DC
  Administered 2011-11-29 – 2011-12-02 (×4): 25 mg via ORAL
  Filled 2011-11-28 (×4): qty 1

## 2011-11-28 MED ORDER — ACETAMINOPHEN 650 MG RE SUPP: 650.0000 mg | Freq: Four times a day (QID) | RECTAL | Status: DC | PRN

## 2011-11-28 MED ORDER — METHOCARBAMOL 500 MG PO TABS
500.0000 mg | ORAL_TABLET | Freq: Four times a day (QID) | ORAL | Status: DC | PRN
  Administered 2011-11-28 – 2011-12-02 (×10): 500 mg via ORAL
  Filled 2011-11-28 (×11): qty 1

## 2011-11-28 MED ORDER — INSULIN ASPART 100 UNIT/ML ~~LOC~~ SOLN
5.0000 [IU] | Freq: Three times a day (TID) | SUBCUTANEOUS | Status: DC
  Administered 2011-11-28 – 2011-11-29 (×3): 5 [IU] via SUBCUTANEOUS
  Administered 2011-11-30: 08:00:00 via SUBCUTANEOUS
  Administered 2011-12-01: 5 [IU] via SUBCUTANEOUS

## 2011-11-28 MED ORDER — LACTATED RINGERS IV SOLN: INTRAVENOUS | Status: DC

## 2011-11-28 MED ORDER — LISINOPRIL-HYDROCHLOROTHIAZIDE 20-12.5 MG PO TABS
2.0000 | ORAL_TABLET | Freq: Every day | ORAL | Status: DC
Start: 2011-11-28 — End: 2011-11-28

## 2011-11-28 MED ORDER — CEFAZOLIN SODIUM-DEXTROSE 2-3 GM-% IV SOLR
2.0000 g | Freq: Four times a day (QID) | INTRAVENOUS | Status: AC
  Administered 2011-11-28 (×2): 2 g via INTRAVENOUS
  Filled 2011-11-28 (×2): qty 50

## 2011-11-28 MED ORDER — LIDOCAINE HCL (CARDIAC) 20 MG/ML IV SOLN
INTRAVENOUS | Status: DC | PRN
  Administered 2011-11-28: 100 mg via INTRAVENOUS

## 2011-11-28 MED ORDER — ISOSORBIDE DINITRATE 30 MG PO TABS
30.0000 mg | ORAL_TABLET | Freq: Every day | ORAL | Status: DC
  Administered 2011-11-29 – 2011-12-02 (×4): 30 mg via ORAL
  Filled 2011-11-28 (×5): qty 1

## 2011-11-28 MED ORDER — DOCUSATE SODIUM 100 MG PO CAPS
100.0000 mg | ORAL_CAPSULE | Freq: Two times a day (BID) | ORAL | Status: DC
  Administered 2011-11-28 – 2011-12-02 (×9): 100 mg via ORAL
  Filled 2011-11-28 (×10): qty 1

## 2011-11-28 MED ORDER — MIDAZOLAM HCL 5 MG/5ML IJ SOLN
INTRAMUSCULAR | Status: DC | PRN
  Administered 2011-11-28 (×2): 1 mg via INTRAVENOUS

## 2011-11-28 MED ORDER — DIPHENHYDRAMINE HCL 12.5 MG/5ML PO ELIX
12.5000 mg | ORAL_SOLUTION | ORAL | Status: DC | PRN
  Administered 2011-11-28 – 2011-12-01 (×5): 25 mg via ORAL
  Filled 2011-11-28 (×5): qty 5

## 2011-11-28 MED ORDER — ALBUTEROL SULFATE HFA 108 (90 BASE) MCG/ACT IN AERS
2.0000 | INHALATION_SPRAY | Freq: Four times a day (QID) | RESPIRATORY_TRACT | Status: DC | PRN
  Filled 2011-11-28: qty 6.7

## 2011-11-28 MED ORDER — METHOCARBAMOL 100 MG/ML IJ SOLN
500.0000 mg | Freq: Four times a day (QID) | INTRAMUSCULAR | Status: DC | PRN
  Administered 2011-11-28: 500 mg via INTRAVENOUS
  Filled 2011-11-28: qty 5

## 2011-11-28 MED ORDER — ACETAMINOPHEN 10 MG/ML IV SOLN
INTRAVENOUS | Status: DC | PRN
  Administered 2011-11-28: 1000 mg via INTRAVENOUS

## 2011-11-28 MED ORDER — GLYCOPYRROLATE 0.2 MG/ML IJ SOLN
INTRAMUSCULAR | Status: DC | PRN
  Administered 2011-11-28: 0.4 mg via INTRAVENOUS

## 2011-11-28 MED ORDER — PHENYLEPHRINE HCL 10 MG/ML IJ SOLN
INTRAMUSCULAR | Status: DC | PRN
  Administered 2011-11-28: 150 ug via INTRAVENOUS

## 2011-11-28 MED ORDER — MENTHOL 3 MG MT LOZG: 1.0000 | LOZENGE | OROMUCOSAL | Status: DC | PRN

## 2011-11-28 MED ORDER — PHENOL 1.4 % MT LIQD: 1.0000 | OROMUCOSAL | Status: DC | PRN

## 2011-11-28 MED ORDER — CEFUROXIME SODIUM 1.5 G IJ SOLR
INTRAMUSCULAR | Status: AC
  Filled 2011-11-28: qty 1.5

## 2011-11-28 MED ORDER — MORPHINE SULFATE 2 MG/ML IJ SOLN
1.0000 mg | INTRAMUSCULAR | Status: DC | PRN
  Administered 2011-11-28: 2 mg via INTRAVENOUS
  Filled 2011-11-28: qty 1

## 2011-11-28 MED ORDER — MAGNESIUM CITRATE PO SOLN
1.0000 | Freq: Once | ORAL | Status: AC | PRN
  Filled 2011-11-28: qty 296

## 2011-11-28 MED ORDER — METOCLOPRAMIDE HCL 5 MG/ML IJ SOLN: 5.0000 mg | Freq: Three times a day (TID) | INTRAMUSCULAR | Status: DC | PRN

## 2011-11-28 MED ORDER — CEFUROXIME SODIUM 1.5 G IJ SOLR
INTRAMUSCULAR | Status: DC | PRN
  Administered 2011-11-28: 1.5 g

## 2011-11-28 MED ORDER — HYDROCODONE-ACETAMINOPHEN 5-325 MG PO TABS
1.0000 | ORAL_TABLET | ORAL | Status: DC | PRN
  Administered 2011-11-28: 2 via ORAL
  Administered 2011-11-28 – 2011-11-29 (×2): 1 via ORAL
  Administered 2011-11-29: 2 via ORAL
  Filled 2011-11-28 (×2): qty 2
  Filled 2011-11-28: qty 1
  Filled 2011-11-28: qty 2

## 2011-11-28 MED ORDER — NEOSTIGMINE METHYLSULFATE 1 MG/ML IJ SOLN
INTRAMUSCULAR | Status: DC | PRN
  Administered 2011-11-28: 4 mg via INTRAVENOUS

## 2011-11-28 MED ORDER — ONDANSETRON HCL 4 MG/2ML IJ SOLN: 4.0000 mg | Freq: Four times a day (QID) | INTRAMUSCULAR | Status: DC | PRN

## 2011-11-28 MED ORDER — LACTATED RINGERS IV SOLN
INTRAVENOUS | Status: DC | PRN
  Administered 2011-11-28 (×2): via INTRAVENOUS

## 2011-11-28 MED ORDER — SENNOSIDES-DOCUSATE SODIUM 8.6-50 MG PO TABS
1.0000 | ORAL_TABLET | Freq: Every evening | ORAL | Status: DC | PRN
  Administered 2011-12-01: 1 via ORAL
  Filled 2011-11-28: qty 1

## 2011-11-28 MED ORDER — ONDANSETRON HCL 4 MG/2ML IJ SOLN
INTRAMUSCULAR | Status: DC | PRN
  Administered 2011-11-28: 4 mg via INTRAVENOUS

## 2011-11-28 MED ORDER — ACETAMINOPHEN 10 MG/ML IV SOLN
INTRAVENOUS | Status: AC
  Filled 2011-11-28: qty 100

## 2011-11-28 MED ORDER — FENTANYL CITRATE 0.05 MG/ML IJ SOLN
INTRAMUSCULAR | Status: DC | PRN
  Administered 2011-11-28 (×4): 50 ug via INTRAVENOUS
  Administered 2011-11-28: 100 ug via INTRAVENOUS

## 2011-11-28 MED ORDER — NITROGLYCERIN IN D5W 200-5 MCG/ML-% IV SOLN
INTRAVENOUS | Status: DC | PRN
  Administered 2011-11-28: 16.6 ug/min via INTRAVENOUS

## 2011-11-28 MED ORDER — INSULIN ASPART 100 UNIT/ML ~~LOC~~ SOLN
0.0000 [IU] | Freq: Three times a day (TID) | SUBCUTANEOUS | Status: DC
  Administered 2011-11-28 – 2011-11-29 (×2): 3 [IU] via SUBCUTANEOUS
  Administered 2011-11-29 – 2011-11-30 (×2): 4 [IU] via SUBCUTANEOUS
  Administered 2011-11-30 – 2011-12-01 (×2): 3 [IU] via SUBCUTANEOUS

## 2011-11-28 MED ORDER — METOCLOPRAMIDE HCL 10 MG PO TABS: 5.0000 mg | ORAL_TABLET | Freq: Three times a day (TID) | ORAL | Status: DC | PRN

## 2011-11-28 MED ORDER — PROPOFOL 10 MG/ML IV EMUL
INTRAVENOUS | Status: DC | PRN
  Administered 2011-11-28: 160 mg via INTRAVENOUS
  Administered 2011-11-28: 40 mg via INTRAVENOUS

## 2011-11-28 MED ORDER — ALUM & MAG HYDROXIDE-SIMETH 200-200-20 MG/5ML PO SUSP: 30.0000 mL | ORAL | Status: DC | PRN

## 2011-11-28 MED ORDER — BUPIVACAINE-EPINEPHRINE PF 0.5-1:200000 % IJ SOLN
INTRAMUSCULAR | Status: DC | PRN
  Administered 2011-11-28: 30 mL

## 2011-11-28 MED ORDER — HYDROMORPHONE HCL PF 1 MG/ML IJ SOLN
0.2500 mg | INTRAMUSCULAR | Status: DC | PRN
  Administered 2011-11-28 (×4): 0.5 mg via INTRAVENOUS

## 2011-11-28 MED ORDER — SIMVASTATIN 5 MG PO TABS
5.0000 mg | ORAL_TABLET | Freq: Every day | ORAL | Status: DC
  Administered 2011-11-28 – 2011-12-01 (×4): 5 mg via ORAL
  Filled 2011-11-28 (×5): qty 1

## 2011-11-28 MED ORDER — AMLODIPINE BESYLATE 10 MG PO TABS
10.0000 mg | ORAL_TABLET | Freq: Every day | ORAL | Status: DC
  Administered 2011-11-29 – 2011-12-02 (×4): 10 mg via ORAL
  Filled 2011-11-28 (×4): qty 1

## 2011-11-28 MED ORDER — ACETAMINOPHEN 325 MG PO TABS: 650.0000 mg | ORAL_TABLET | Freq: Four times a day (QID) | ORAL | Status: DC | PRN

## 2011-11-28 MED ORDER — PANTOPRAZOLE SODIUM 20 MG PO TBEC
20.0000 mg | DELAYED_RELEASE_TABLET | Freq: Every day | ORAL | Status: DC
  Administered 2011-11-29 – 2011-12-02 (×4): 20 mg via ORAL
  Filled 2011-11-28 (×4): qty 1

## 2011-11-28 SURGICAL SUPPLY — 61 items
AUTOTRANSFUSION W/QD PVC DRAIN (AUTOTRANSFUSION) ×2 IMPLANT
BANDAGE ELASTIC 4 VELCRO ST LF (GAUZE/BANDAGES/DRESSINGS) ×2 IMPLANT
BANDAGE ELASTIC 6 VELCRO ST LF (GAUZE/BANDAGES/DRESSINGS) ×2 IMPLANT
BANDAGE ESMARK 6X9 LF (GAUZE/BANDAGES/DRESSINGS) ×1 IMPLANT
BANDAGE GAUZE ELAST BULKY 4 IN (GAUZE/BANDAGES/DRESSINGS) ×2 IMPLANT
BLADE SAGITTAL 25.0X1.19X90 (BLADE) ×2 IMPLANT
BLADE SURG ROTATE 9660 (MISCELLANEOUS) IMPLANT
BNDG ELASTIC 6X10 VLCR STRL LF (GAUZE/BANDAGES/DRESSINGS) ×2 IMPLANT
BNDG ESMARK 6X9 LF (GAUZE/BANDAGES/DRESSINGS) ×2
BOWL SMART MIX CTS (DISPOSABLE) ×2 IMPLANT
CEMENT HV SMART SET (Cement) ×4 IMPLANT
CLOTH BEACON ORANGE TIMEOUT ST (SAFETY) ×2 IMPLANT
COVER SURGICAL LIGHT HANDLE (MISCELLANEOUS) ×2 IMPLANT
CUFF TOURNIQUET SINGLE 34IN LL (TOURNIQUET CUFF) ×2 IMPLANT
CUFF TOURNIQUET SINGLE 44IN (TOURNIQUET CUFF) IMPLANT
DRAPE EXTREMITY T 121X128X90 (DRAPE) ×2 IMPLANT
DRAPE PROXIMA HALF (DRAPES) ×2 IMPLANT
DRAPE U-SHAPE 47X51 STRL (DRAPES) ×2 IMPLANT
DRSG ADAPTIC 3X8 NADH LF (GAUZE/BANDAGES/DRESSINGS) ×2 IMPLANT
DRSG PAD ABDOMINAL 8X10 ST (GAUZE/BANDAGES/DRESSINGS) ×2 IMPLANT
DURAPREP 26ML APPLICATOR (WOUND CARE) ×2 IMPLANT
ELECT REM PT RETURN 9FT ADLT (ELECTROSURGICAL) ×2
ELECTRODE REM PT RTRN 9FT ADLT (ELECTROSURGICAL) ×1 IMPLANT
EVACUATOR 1/8 PVC DRAIN (DRAIN) ×2 IMPLANT
FACESHIELD LNG OPTICON STERILE (SAFETY) ×4 IMPLANT
GLOVE BIO SURGEON STRL SZ8.5 (GLOVE) ×2 IMPLANT
GLOVE BIOGEL PI IND STRL 8 (GLOVE) ×1 IMPLANT
GLOVE BIOGEL PI IND STRL 8.5 (GLOVE) ×1 IMPLANT
GLOVE BIOGEL PI INDICATOR 8 (GLOVE) ×1
GLOVE BIOGEL PI INDICATOR 8.5 (GLOVE) ×1
GLOVE SS BIOGEL STRL SZ 8 (GLOVE) ×1 IMPLANT
GLOVE SUPERSENSE BIOGEL SZ 8 (GLOVE) ×1
GOWN PREVENTION PLUS XLARGE (GOWN DISPOSABLE) ×2 IMPLANT
GOWN PREVENTION PLUS XXLARGE (GOWN DISPOSABLE) ×2 IMPLANT
GOWN STRL NON-REIN LRG LVL3 (GOWN DISPOSABLE) ×2 IMPLANT
HANDPIECE INTERPULSE COAX TIP (DISPOSABLE) ×1
HOOD PEEL AWAY FACE SHEILD DIS (HOOD) ×2 IMPLANT
IMMOBILIZER KNEE 20 (SOFTGOODS)
IMMOBILIZER KNEE 20 THIGH 36 (SOFTGOODS) IMPLANT
IMMOBILIZER KNEE 22 UNIV (SOFTGOODS) ×2 IMPLANT
IMMOBILIZER KNEE 24 THIGH 36 (MISCELLANEOUS) IMPLANT
IMMOBILIZER KNEE 24 UNIV (MISCELLANEOUS)
KIT BASIN OR (CUSTOM PROCEDURE TRAY) ×2 IMPLANT
KIT ROOM TURNOVER OR (KITS) ×2 IMPLANT
MANIFOLD NEPTUNE II (INSTRUMENTS) ×2 IMPLANT
NS IRRIG 1000ML POUR BTL (IV SOLUTION) ×2 IMPLANT
PACK TOTAL JOINT (CUSTOM PROCEDURE TRAY) ×2 IMPLANT
PAD ARMBOARD 7.5X6 YLW CONV (MISCELLANEOUS) ×4 IMPLANT
SET HNDPC FAN SPRY TIP SCT (DISPOSABLE) ×1 IMPLANT
SPONGE GAUZE 4X4 12PLY (GAUZE/BANDAGES/DRESSINGS) ×2 IMPLANT
STAPLER VISISTAT 35W (STAPLE) ×2 IMPLANT
SUCTION FRAZIER TIP 10 FR DISP (SUCTIONS) ×2 IMPLANT
SUT VIC AB 0 CT1 27 (SUTURE) ×2
SUT VIC AB 0 CT1 27XBRD ANBCTR (SUTURE) ×2 IMPLANT
SUT VIC AB 2-0 CT1 27 (SUTURE) ×2
SUT VIC AB 2-0 CT1 TAPERPNT 27 (SUTURE) ×2 IMPLANT
SUT VLOC 180 0 24IN GS25 (SUTURE) ×2 IMPLANT
TOWEL OR 17X24 6PK STRL BLUE (TOWEL DISPOSABLE) ×2 IMPLANT
TOWEL OR 17X26 10 PK STRL BLUE (TOWEL DISPOSABLE) ×2 IMPLANT
TRAY FOLEY CATH 14FR (SET/KITS/TRAYS/PACK) ×2 IMPLANT
WATER STERILE IRR 1000ML POUR (IV SOLUTION) ×6 IMPLANT

## 2011-11-28 NOTE — Anesthesia Procedure Notes (Addendum)
Anesthesia Regional Block:  Femoral nerve block  Pre-Anesthetic Checklist: ,, timeout performed, Correct Patient, Correct Site, Correct Laterality, Correct Procedure, Correct Position, site marked, Risks and benefits discussed,  Surgical consent,  Pre-op evaluation,  At surgeon's request and post-op pain management  Laterality: Right  Prep: chloraprep       Needles:  Injection technique: Single-shot  Needle Type: Echogenic Stimulator Needle          Additional Needles:  Procedures: ultrasound guided and nerve stimulator Femoral nerve block  Nerve Stimulator or Paresthesia:  Response: 0.4 mA,   Additional Responses:   Narrative:  Start time: 11/28/2011 7:15 AM End time: 11/28/2011 7:30 AM Injection made incrementally with aspirations every 5 mL.  Performed by: Personally  Anesthesiologist: Lillia Abed MD  Additional Notes: Monitors applied. Patient sedated. Sterile prep and drape,hand hygiene and sterile gloves were used. Relevant anatomy identified.Needle position confirmed.Local anesthetic injected incrementally after negative aspiration. Local anesthetic spread visualized around nerve(s). Vascular puncture avoided. No complications. Image printed for medical record.The patient tolerated the procedure well.       Femoral nerve block Procedure Name: Intubation Date/Time: 11/28/2011 7:49 AM Performed by: Wanita Chamberlain Pre-anesthesia Checklist: Patient identified, Timeout performed, Emergency Drugs available, Suction available and Patient being monitored Patient Re-evaluated:Patient Re-evaluated prior to inductionOxygen Delivery Method: Circle system utilized Preoxygenation: Pre-oxygenation with 100% oxygen Intubation Type: IV induction Ventilation: Mask ventilation without difficulty and Oral airway inserted - appropriate to patient size Laryngoscope Size: Mac and 3 Grade View: Grade I Tube type: Oral Tube size: 7.5 mm Number of attempts: 1 Airway Equipment and  Method: Stylet and LTA kit utilized Placement Confirmation: ETT inserted through vocal cords under direct vision,  positive ETCO2,  CO2 detector and breath sounds checked- equal and bilateral Secured at: 22 cm Tube secured with: Tape Dental Injury: Teeth and Oropharynx as per pre-operative assessment  Comments: Pt's pharynx w/ a lot of redundant soft tissue.  Tonsils also enlarged.  False v. Cords w/ some edema present.

## 2011-11-28 NOTE — Preoperative (Addendum)
Beta Blockers  Pt took Lopressor @ 03:15    0n 11/28/11

## 2011-11-28 NOTE — Anesthesia Postprocedure Evaluation (Signed)
Anesthesia Post Note  Patient: Ann [name]  Procedure(s) Performed: Procedure(s) (LRB): TOTAL KNEE ARTHROPLASTY (Right)  Anesthesia type: general  Patient location: PACU  Post pain: Pain level controlled  Post assessment: Patient's Cardiovascular Status Stable  Last Vitals:  Filed Vitals:   11/28/11 1018  BP: 127/70  Temp: 36.6 C  Resp:     Post vital signs: Reviewed and stable  Level of consciousness: sedated  Complications: No apparent anesthesia complications

## 2011-11-28 NOTE — Op Note (Signed)
PREOP DIAGNOSIS: DJD RIGHT KNEE POSTOP DIAGNOSIS: DJD RIGHT KNEE PROCEDURE: RIGHT TKR ANESTHESIA: General and block ATTENDING SURGEON: Ethanael Veith G ASSISTANT: Roselee Nova PA  INDICATIONS FOR PROCEDURE: Ann [name] is a 61 y.o. female who has struggled for a long time with pain due to degenerative arthritis of the right knee.  The patient has failed many conservative non-operative measures and at this point has pain which limits the ability to sleep and walk.  The patient is offered total knee replacement.  Informed operative consent was obtained after discussion of possible risks of anesthesia, infection, neurovascular injury, DVT, and death.  The importance of the post-operative rehabilitation protocol to optimize result was stressed extensively with the patient.  SUMMARY OF FINDINGS AND PROCEDURE:  [name] was taken to the operative suite where under the above anesthesia a right knee replacement was performed.  There were advanced degenerative changes and the bone quality was good.  We used the DePuy system and placed size standard plus femur, 3MBT tibia, 52mm all polyethylene patella, and a size 12.8mm spacer.  I did include Zinacef antibiotic in the cement.  The patient was admitted for appropriate post-op care to include perioperative antibiotics and mechanical and pharmacologic measures for DVT prophylaxis.  DESCRIPTION OF PROCEDURE:  [name] was taken to the operative suite where the above anesthesia was applied.  The patient was positioned supine and prepped and draped in normal sterile fashion.  An appropriate time out was performed.  After the administration of Kefzol pre-op antibiotic a standard longitudinal incision was made on the anterior knee.  Dissection was carried down to the extensor mechanism.  All appropriate anti-infective measures were used including the pre-operative antibiotic, betadine impregnated drape, and closed hooded exhaust systems for each member of the  surgical team.  A medial parapatellar incision was made in the extensor mechanism and the knee cap flipped and the knee flexed.  Some residual meniscal tissues were removed along with any remaining ACL/PCL tissue.  A guide was placed on the tibia and a flat cut was made on it's superior surface.  An intramedullary guide was placed in the femur and was utilized to make anterior and posterior cuts creating an appropriate flexion gap.  A second intramedullary guide was placed in the femur to make a distal cut properly balancing the knee with an extension gap equal to the flexion gap.  The three bones sized to the above mentioned sizes and the appropriate guides were placed and utilized.  A trial reduction was done and the knee easily came to full extension and the patella tracked well on flexion.  The trial components were removed and all bones were cleaned with pulsatile lavage and then dried thoroughly.  Cement was mixed including antibiotic and was pressurized onto the bones followed by placement of the aforementioned components.  Excess cement was trimmed and pressure was held on the components until the cement had hardened.  The tourniquet was deflated and a small amount of bleeding was controlled with cautery and pressure.  The knee was irrigated.  The extensor mechanism was re-approximated with V-loc suture in running fashion.  The knee was flexed and the repair was solid.  The subcutaneous tissues were re-approximated with #0 and #2-0 vicryl and the skin closed with staples.  A sterile dressing was applied.  Intraoperative fluids, EBL, and tourniquet time can be obtained from anesthesia records.  DISPOSITION:  The patient was taken to recovery room in stable condition and admitted for appropriate post-op care to  include peri-operative antibiotic and DVT prophylaxis with mechanical and pharmacologic measures.  Tashea Othman G 11/28/2011, 9:37 AM

## 2011-11-28 NOTE — Interval H&P Note (Signed)
History and Physical Interval Note:  11/28/2011 7:30 AM  Ann [name]  has presented today for surgery, with the diagnosis of RIGHT TOTAL KNEE ARTHROPLASTY  The various methods of treatment have been discussed with the patient and family. After consideration of risks, benefits and other options for treatment, the patient has consented to  Procedure(s) (LRB): TOTAL KNEE ARTHROPLASTY (Right) as a surgical intervention .  The patient's history has been reviewed, patient examined, no change in status, stable for surgery.  I have reviewed the patient's chart and labs.  Questions were answered to the patient's satisfaction.     Kelvin Sennett G

## 2011-11-28 NOTE — Progress Notes (Signed)
Orthopedic Tech Progress Note Patient Details:  Ann [name] 1950-10-13 [id number]  CPM Right Knee CPM Right Knee: On Right Knee Flexion (Degrees): 60  Right Knee Extension (Degrees): 0  Additional Comments: trapeze bar   Cammer, Theodoro Parma 11/28/2011, 11:00 AM

## 2011-11-28 NOTE — Progress Notes (Signed)
12 lead EKG completed as ordered.  NSR. Anesthesiologist has viewed EKG. Pt. May go to 5 N as ordered.

## 2011-11-28 NOTE — Anesthesia Preprocedure Evaluation (Addendum)
Anesthesia Evaluation  Patient identified by MRN, date of birth, ID band Patient awake    Reviewed: Allergy & Precautions, H&P , NPO status , Patient's Chart, lab work & pertinent test results  Airway Mallampati: I TM Distance: >3 FB Neck ROM: Full    Dental  (+) Teeth Intact and Dental Advisory Given   Pulmonary shortness of breath and with exertion, asthma , sleep apnea ,    Pulmonary exam normal       Cardiovascular Exercise Tolerance: Poor hypertension, Pt. on medications and Pt. on home beta blockers + CAD Rate:Normal     Neuro/Psych    GI/Hepatic GERD-  Medicated and Controlled,  Endo/Other  Type 2, Oral Hypoglycemic AgentsMorbid obesity  Renal/GU      Musculoskeletal   Abdominal   Peds  Hematology   Anesthesia Other Findings   Reproductive/Obstetrics                        Anesthesia Physical Anesthesia Plan  ASA: III  Anesthesia Plan: General   Post-op Pain Management: MAC Combined w/ Regional for Post-op pain   Induction: Intravenous  Airway Management Planned: LMA and Oral ETT  Additional Equipment:   Intra-op Plan:   Post-operative Plan: Extubation in OR  Informed Consent: I have reviewed the patients History and Physical, chart, labs and discussed the procedure including the risks, benefits and alternatives for the proposed anesthesia with the patient or authorized representative who has indicated his/her understanding and acceptance.     Plan Discussed with: CRNA and Surgeon  Anesthesia Plan Comments:        Anesthesia Quick Evaluation

## 2011-11-28 NOTE — Progress Notes (Signed)
UR COMPLETED  

## 2011-11-28 NOTE — Transfer of Care (Signed)
Immediate Anesthesia Transfer of Care Note  Patient: Ann [name]  Procedure(s) Performed: Procedure(s) (LRB): TOTAL KNEE ARTHROPLASTY (Right)  Patient Location: PACU  Anesthesia Type: General  Level of Consciousness: awake, alert , oriented and patient cooperative  Airway & Oxygen Therapy: Patient Spontanous Breathing and Patient connected to nasal cannula oxygen  Post-op Assessment: Report given to PACU RN and Post -op Vital signs reviewed and stable  Post vital signs: Reviewed and stable  Complications: No apparent anesthesia complications

## 2011-11-29 ENCOUNTER — Encounter (HOSPITAL_COMMUNITY): Payer: Self-pay | Admitting: Orthopaedic Surgery

## 2011-11-29 LAB — GLUCOSE, CAPILLARY
Glucose-Capillary: 101 mg/dL — ABNORMAL HIGH (ref 70–99)
Glucose-Capillary: 175 mg/dL — ABNORMAL HIGH (ref 70–99)
Glucose-Capillary: 143 mg/dL — ABNORMAL HIGH (ref 70–99)
Glucose-Capillary: 110 mg/dL — ABNORMAL HIGH (ref 70–99)

## 2011-11-29 LAB — CBC
HCT: 28 % — ABNORMAL LOW (ref 36.0–46.0)
Hemoglobin: 9.3 g/dL — ABNORMAL LOW (ref 12.0–15.0)
MCH: 28.3 pg (ref 26.0–34.0)
MCHC: 33.2 g/dL (ref 30.0–36.0)
MCV: 85.1 fL (ref 78.0–100.0)
Platelets: 175 10*3/uL (ref 150–400)
RBC: 3.29 MIL/uL — ABNORMAL LOW (ref 3.87–5.11)
RDW: 14.1 % (ref 11.5–15.5)
WBC: 8.8 10*3/uL (ref 4.0–10.5)

## 2011-11-29 LAB — BASIC METABOLIC PANEL
BUN: 27 mg/dL — ABNORMAL HIGH (ref 6–23)
CO2: 26 mEq/L (ref 19–32)
Calcium: 9.1 mg/dL (ref 8.4–10.5)
Chloride: 99 mEq/L (ref 96–112)
Creatinine, Ser: 1.9 mg/dL — ABNORMAL HIGH (ref 0.50–1.10)
GFR calc Af Amer: 32 mL/min — ABNORMAL LOW (ref >90)
GFR calc non Af Amer: 27 mL/min — ABNORMAL LOW (ref >90)
Glucose, Bld: 102 mg/dL — ABNORMAL HIGH (ref 70–99)
Potassium: 4.1 mEq/L (ref 3.5–5.1)
Sodium: 135 mEq/L (ref 135–145)

## 2011-11-29 MED ORDER — HYDROMORPHONE HCL PF 1 MG/ML IJ SOLN
1.0000 mg | INTRAMUSCULAR | Status: DC | PRN
  Administered 2011-11-29: 1 mg via INTRAVENOUS
  Filled 2011-11-29: qty 1

## 2011-11-29 MED ORDER — HYDROCODONE-ACETAMINOPHEN 10-325 MG PO TABS
1.0000 | ORAL_TABLET | ORAL | Status: DC | PRN
  Administered 2011-11-29 – 2011-12-02 (×9): 2 via ORAL
  Filled 2011-11-29 (×9): qty 2

## 2011-11-29 NOTE — Progress Notes (Signed)
Physical Therapy Treatment Patient Details Name: Ann [name] MRN: [id number] DOB: 01/23/1951 Today's Date: 11/29/2011 Time: [id number] PT Time Calculation (min): 22 min  PT Assessment / Plan / Recommendation Comments on Treatment Session       Follow Up Recommendations  Home health PT;Supervision - Intermittent    Barriers to Discharge        Equipment Recommendations  Rolling walker with 5" wheels;Tub/shower bench;3 in 1 bedside comode    Recommendations for Other Services    Frequency 7X/week   Plan Discharge plan remains appropriate;Frequency remains appropriate    Precautions / Restrictions Precautions Precautions: Fall Restrictions Weight Bearing Restrictions: Yes RLE Weight Bearing: Weight bearing as tolerated   Pertinent Vitals/Pain Pt reports no pain in R knee.      Mobility  Bed Mobility Bed Mobility: Sit to Supine;Supine to Sit Supine to Sit: 4: Min assist;HOB flat Sit to Supine: 3: Mod assist Details for Bed Mobility Assistance: Assist for bilateral LEs.  Transfers Transfers: Stand to Sit;Sit to Stand Sit to Stand: 4: Min guard;From bed Stand to Sit: 4: Min guard;To bed Details for Transfer Assistance: cues for R LE positioning.  Ambulation/Gait Ambulation/Gait Assistance: 4: Min guard Ambulation Distance (Feet): 60 Feet Assistive device: Rolling walker Ambulation/Gait Assistance Details: Cues for sequencing and decreased dependence on UEs.  Gait Pattern: Step-to pattern;Decreased stance time - right Stairs: No Wheelchair Mobility Wheelchair Mobility: No    Exercises     PT Diagnosis:    PT Problem List:   PT Treatment Interventions:     PT Goals Acute Rehab PT Goals PT Goal Formulation: With patient Time For Goal Achievement: 12/06/11 Potential to Achieve Goals: Good Pt will go Supine/Side to Sit: with supervision;with HOB 0 degrees PT Goal: Supine/Side to Sit - Progress: Progressing toward goal Pt will go Sit to Supine/Side: with  supervision PT Goal: Sit to Supine/Side - Progress: Progressing toward goal Pt will go Sit to Stand: with supervision PT Goal: Sit to Stand - Progress: Progressing toward goal Pt will go Stand to Sit: with supervision PT Goal: Stand to Sit - Progress: Progressing toward goal Pt will Transfer Bed to Chair/Chair to Bed: with supervision PT Transfer Goal: Bed to Chair/Chair to Bed - Progress: Progressing toward goal Pt will Ambulate: 51 - 150 feet;with supervision;with rolling walker PT Goal: Ambulate - Progress: Progressing toward goal  Visit Information  Last PT Received On: 11/29/11 Assistance Needed: +1    Subjective Data  Subjective: I am ready   Cognition  Overall Cognitive Status: Appears within functional limits for tasks assessed/performed Arousal/Alertness: Awake/alert Orientation Level: Oriented X4 / Intact Behavior During Session: [facility] for tasks performed    Balance     End of Session PT - End of Session Equipment Utilized During Treatment: Gait belt Activity Tolerance: Patient tolerated treatment well Patient left: in bed;with call bell/phone within reach;with family/visitor present Nurse Communication: Mobility status CPM Right Knee CPM Right Knee: Off   GP     Ann [name] 11/29/2011, 6:11 PM Ann [name] L. Aiyden Lauderback DPT [phone]

## 2011-11-29 NOTE — Progress Notes (Addendum)
Subjective: 1 Day Post-Op Procedure(s) (LRB): TOTAL KNEE ARTHROPLASTY (Right)  Activity level:  Bedrest/CPM Diet tolerance:  regular Voiding:  Foley cath in place Patient reports pain as severe.    Objective: Vital signs in last 24 hours: Temp:  [97 F (36.1 C)-99.7 F (37.6 C)] 99.7 F (37.6 C) (08/07 0546) Pulse Rate:  [65-84] 83  (08/07 0546) Resp:  [12-20] 20  (08/07 0546) BP: (120-188)/(60-71) 188/65 mmHg (08/07 0546) SpO2:  [93 %-99 %] 98 % (08/07 0546)  Labs: No results found for this basename: HGB:5 in the last 72 hours No results found for this basename: WBC:2,RBC:2,HCT:2,PLT:2 in the last 72 hours No results found for this basename: NA:2,K:2,CL:2,CO2:2,BUN:2,CREATININE:2,GLUCOSE:2,CALCIUM:2 in the last 72 hours No results found for this basename: LABPT:2,INR:2 in the last 72 hours  Physical Exam:  Neurologically intact ABD soft Sensation intact distally Intact pulses distally Dorsiflexion/Plantar flexion intact Compartment soft  Assessment/Plan:  1 Day Post-Op Procedure(s) (LRB): TOTAL KNEE ARTHROPLASTY (Right)  Up with therapy Will change to Dilaudid per patient request Labs all still pending Dressing change tomorrow and hopefully SNF but will see if she gets more comfortable    Ann [name] G 11/29/2011, 6:22 AM

## 2011-11-29 NOTE — Progress Notes (Signed)
PT PROGRESS NOTE:  11/29/11 1200  PT Visit Information  Last PT Received On 11/29/11  Assistance Needed +1  PT Time Calculation  PT Start Time 1159  PT Stop Time 1212  PT Time Calculation (min) 13 min  Subjective Data  Subjective I cant get up right now   Patient Stated Goal be able to walk  Precautions  Precautions Fall  Required Braces or Orthoses Knee Immobilizer - Right (discontinued)  Knee Immobilizer - Right Other (comment) (discontinued. )  Restrictions  Weight Bearing Restrictions Yes  RLE Weight Bearing WBAT  Cognition  Overall Cognitive Status Appears within functional limits for tasks assessed/performed  Arousal/Alertness Awake/alert  Orientation Level Oriented X4 / Intact  Behavior During Session [facility] for tasks performed  Bed Mobility  Bed Mobility Not assessed  Transfers  Transfers Not assessed  Ambulation/Gait  Ambulation/Gait Assistance Not tested (comment)  Wheelchair Mobility  Wheelchair Mobility No  Exercises  Exercises Total Joint  Total Joint Exercises  Ankle Circles/Pumps Both;10 reps  Quad Sets Right;5 reps;Supine  Heel Slides 10 reps;Right;AROM  PT - End of Session  Activity Tolerance Patient limited by fatigue;Patient limited by pain  Patient left in bed;in CPM;with call bell/phone within reach;with family/visitor present  PT - Assessment/Plan  Comments on Treatment Session Pt just returned to bed prior to PT arrival  Had difficulty keeping pt awake to participate in ther ex. Pt reports pain much improved after PT intervention in earlier session.    PT Plan Discharge plan remains appropriate;Frequency remains appropriate  PT Frequency 7X/week  Follow Up Recommendations Home health PT;Supervision - Intermittent  Equipment Recommended Rolling walker with 5" wheels;3 in 1 bedside comode  Acute Rehab PT Goals  PT Goal Formulation With patient  Time For Goal Achievement 12/06/11  Potential to Achieve Goals Good  Pt will go Supine/Side to Sit with  supervision;with HOB 0 degrees  PT General Charges  $$ ACUTE PT VISIT 1 Procedure  PT Treatments  $Therapeutic Exercise 8-22 mins   Jana Swartzlander L. Mert Dietrick DPT [phone]

## 2011-11-29 NOTE — Progress Notes (Signed)
Occupational Therapy Evaluation Patient Details Name: Ann [name] MRN: [id number] DOB: [birth year] Today's Date: 11/29/2011 Time: [id number] OT Time Calculation (min): 21 min  OT Assessment / Plan / Recommendation Clinical Impression  60 yo s/p R TKA. Pt will benefit from skilled OT services to max independence with ADL and functional mobilty for ADl with nec DME and AE due to  bleow deficits.    OT Assessment  Patient needs continued OT Services    Follow Up Recommendations  No OT follow up    Barriers to Discharge None    Equipment Recommendations  Rolling walker with 5" wheels;Tub/shower bench;3 in 1 bedside comode    Recommendations for Other Services    Frequency  Min 2X/week    Precautions / Restrictions     Pertinent Vitals/Pain 7. Pain meds prior to session    ADL  Grooming: Simulated;Set up Where Assessed - Grooming: Supported sitting Upper Body Bathing: Simulated;Set up Where Assessed - Upper Body Bathing: Supported sitting Lower Body Bathing: Simulated;Moderate assistance Where Assessed - Lower Body Bathing: Supine, head of bed up;Rolling right and/or left Upper Body Dressing: Simulated;Set up Where Assessed - Upper Body Dressing: Supported sitting Lower Body Dressing: Simulated;Moderate assistance Where Assessed - Lower Body Dressing: Supine, head of bed up;Rolling right and/or left Transfers/Ambulation Related to ADLs:  declined transfers due to pain. Will assess next session ADL Comments: Pt would benefit from AE for LB ADL    OT Diagnosis: Generalized weakness;Acute pain  OT Problem List: Decreased strength;Decreased range of motion;Decreased activity tolerance;Decreased safety awareness;Decreased knowledge of use of DME or AE;Obesity;Pain OT Treatment Interventions: Self-care/ADL training;Energy conservation;DME and/or AE instruction;Therapeutic activities;Patient/family education   OT Goals Acute Rehab OT Goals OT Goal Formulation: With patient Time For  Goal Achievement: 12/06/11 Potential to Achieve Goals: Good ADL Goals Pt Will Perform Lower Body Bathing: with supervision;with caregiver independent in assisting;Sit to stand from chair;with adaptive equipment;Unsupported ADL Goal: Lower Body Bathing - Progress: Goal set today Pt Will Perform Lower Body Dressing: with supervision;with caregiver independent in assisting;Sit to stand from chair;Unsupported;with adaptive equipment ADL Goal: Lower Body Dressing - Progress: Goal set today Pt Will Transfer to Toilet: with modified independence;with DME;3-in-1;Ambulation ADL Goal: Toilet Transfer - Progress: Goal set today Pt Will Perform Toileting - Clothing Manipulation: with modified independence;Standing ADL Goal: Toileting - Clothing Manipulation - Progress: Goal set today Pt Will Perform Tub/Shower Transfer: with min assist;with caregiver independent in assisting;Transfer tub bench;with DME;Ambulation ADL Goal: Tub/Shower Transfer - Progress: Goal set today  Visit Information  Last OT Received On: 11/29/11    Subjective Data      Prior Functioning  Vision/Perception  Home Living Lives With: Daughter;Family Available Help at Discharge: Family;Available 24 hours/day Type of Home: House Home Access: Ramped entrance Home Layout: One level Bathroom Shower/Tub: Tub/shower unit;Curtain Bathroom Toilet: Standard Bathroom Accessibility: Yes How Accessible: Accessible via walker;Accessible via wheelchair Home Adaptive Equipment: None Prior Function Level of Independence: Independent Able to Take Stairs?: Yes Driving: Yes Vocation: On disability Communication Communication: No difficulties Dominant Hand: Right      Cognition  Overall Cognitive Status: Appears within functional limits for tasks assessed/performed Arousal/Alertness: Awake/alert Orientation Level: Oriented X4 / Intact Behavior During Session: [facility] for tasks performed    Extremity/Trunk Assessment Right Upper  Extremity Assessment RUE ROM/Strength/Tone: Within functional levels Left Upper Extremity Assessment LUE ROM/Strength/Tone: Within functional levels Trunk Assessment Trunk Assessment: Normal   Mobility     Exercise    Balance    End of Session OT -  End of Session Activity Tolerance: Patient limited by pain Patient left: in bed;with call bell/phone within reach;with family/visitor present CPM Right Knee CPM Right Knee: Off  GO     Collin Rengel,HILLARY 11/29/2011, 5:31 PM [facility], OTR/L  [phone] 11/29/2011

## 2011-11-29 NOTE — Progress Notes (Signed)
PT EVALUATION NOTE:   11/29/11 0946  PT Visit Information  Last PT Received On 11/29/11  Assistance Needed +1  PT Time Calculation  PT Start Time 0945  PT Stop Time 1002  PT Time Calculation (min) 17 min  Subjective Data  Subjective My pain is too bad  Patient Stated Goal be able to walk   Precautions  Precautions Fall  Restrictions  Weight Bearing Restrictions Yes  RLE Weight Bearing WBAT  Home Living  Lives With Daughter;Family  Available Help at Discharge Family  Type of [place] entrance  [place] One level  Bathroom Shower/Tub Tub/shower unit;Curtain  Corporate treasurer Yes  How Accessible Accessible via walker;Accessible via wheelchair  [place] None  Prior Function  Level of Independence Independent  Able to Take Stairs? Yes  Driving Yes  Vocation On disability  Communication  Communication No difficulties  Cognition  Overall Cognitive Status Appears within functional limits for tasks assessed/performed  Arousal/Alertness Awake/alert  Orientation Level Oriented X4 / Intact  Behavior During Session [facility] for tasks performed  Right Upper Extremity Assessment  RUE ROM/Strength/Tone WFL  Left Upper Extremity Assessment  LUE ROM/Strength/Tone WFL  Right Lower Extremity Assessment  RLE ROM/Strength/Tone Deficits  RLE ROM/Strength/Tone Deficits decreased strength and ROM secondary to pain.   Left Lower Extremity Assessment  LLE ROM/Strength/Tone WFL  Bed Mobility  Bed Mobility Sit to Supine  Sit to Supine 3: Mod assist  Details for Bed Mobility Assistance Assist for bilateral LEs.   Transfers  Transfers Not assessed  Ambulation/Gait  Ambulation/Gait Assistance Not tested (comment)  Wheelchair Mobility  Wheelchair Mobility No  Exercises  Exercises Total Joint  Total Joint Exercises  Ankle Circles/Pumps Both;10 reps  Quad Sets Right;5 reps;Supine  Goniometric ROM 0-50 degrees R knee  AAROM  PT - End of Session  Activity Tolerance Patient limited by pain  Patient left in bed;with call bell/phone within reach;with family/visitor present;Other (comment) (With TENs Unit on R knee)  Nurse Communication Patient requests pain meds  PT Assessment  Clinical Impression Statement Pt is a 61 y/o female s/p R TKA.  Unable to fully assess pt mobility secondary to untolerable pain in knee.  Applied TENS unit and ICE to Right knee after perfomring manual A-P joint mobs with distractions and Soft tissue mobilization of the medial and lateral quadraceps.  Pt able to rest comfortably after PT intervention for pain control.  Will further assess pt mobility later today.      PT Recommendation/Assessment Patient needs continued PT services  PT Problem List Decreased strength;Decreased range of motion;Decreased activity tolerance;Decreased mobility;Decreased safety awareness;Decreased knowledge of use of DME;Decreased knowledge of precautions;Obesity;Pain  Barriers to Discharge None  PT Therapy Diagnosis  Acute pain;Difficulty walking;Generalized weakness  PT Plan  PT Frequency 7X/week  PT Treatment/Interventions Gait training;DME instruction;Stair training;Functional mobility training;Therapeutic activities;Therapeutic exercise;Neuromuscular re-education;Patient/family education;Modalities;Manual techniques  PT Recommendation  Follow Up Recommendations Home health PT;Supervision - Intermittent  Equipment Recommended Rolling walker with 5" wheels;3 in 1 bedside comode  Individuals Consulted  Consulted and Agree with Results and Recommendations Patient;Family member/caregiver  Family Member Consulted Daughter  Acute Rehab PT Goals  PT Goal Formulation With patient  Time For Goal Achievement 12/06/11  Potential to Achieve Goals Good  Pt will go Supine/Side to Sit with supervision;with HOB 0 degrees  PT Goal: Supine/Side to Sit - Progress Goal set today  Pt will go Sit to Supine/Side with  supervision  PT Goal: Sit  to Supine/Side - Progress Goal set today  Pt will go Sit to Stand with supervision  PT Goal: Sit to Stand - Progress Goal set today  Pt will go Stand to Sit with supervision  PT Goal: Stand to Sit - Progress Goal set today  Pt will Transfer Bed to Chair/Chair to Bed with supervision  PT Transfer Goal: Bed to Chair/Chair to Bed - Progress Goal set today  Pt will Ambulate 51 - 150 feet;with supervision;with rolling walker  PT Goal: Ambulate - Progress Goal set today  PT General Charges  $$ ACUTE PT VISIT 1 Procedure  PT Evaluation  $Initial PT Evaluation Tier II 1 Procedure  Written Expression  Dominant Hand Right   Abdul Beirne L. Lexandra Rettke DPT [phone]

## 2011-11-30 LAB — CBC
HCT: 23.6 % — ABNORMAL LOW (ref 36.0–46.0)
Hemoglobin: 8 g/dL — ABNORMAL LOW (ref 12.0–15.0)
MCH: 28.9 pg (ref 26.0–34.0)
MCHC: 33.9 g/dL (ref 30.0–36.0)
MCV: 85.2 fL (ref 78.0–100.0)
Platelets: 138 10*3/uL — ABNORMAL LOW (ref 150–400)
RBC: 2.77 MIL/uL — ABNORMAL LOW (ref 3.87–5.11)
RDW: 14.4 % (ref 11.5–15.5)
WBC: 9.8 10*3/uL (ref 4.0–10.5)

## 2011-11-30 LAB — GLUCOSE, CAPILLARY
Glucose-Capillary: 133 mg/dL — ABNORMAL HIGH (ref 70–99)
Glucose-Capillary: 64 mg/dL — ABNORMAL LOW (ref 70–99)
Glucose-Capillary: 95 mg/dL (ref 70–99)
Glucose-Capillary: 167 mg/dL — ABNORMAL HIGH (ref 70–99)
Glucose-Capillary: 159 mg/dL — ABNORMAL HIGH (ref 70–99)

## 2011-11-30 MED ORDER — METHOCARBAMOL 500 MG PO TABS: 500.0000 mg | ORAL_TABLET | Freq: Four times a day (QID) | ORAL | Status: AC | PRN

## 2011-11-30 MED ORDER — ASPIRIN 325 MG PO TBEC: 325.0000 mg | DELAYED_RELEASE_TABLET | Freq: Two times a day (BID) | ORAL | Status: AC

## 2011-11-30 MED ORDER — HYDROCODONE-ACETAMINOPHEN 10-325 MG PO TABS: 1.0000 | ORAL_TABLET | ORAL | Status: AC | PRN

## 2011-11-30 MED ORDER — CELECOXIB 200 MG PO CAPS: 200.0000 mg | ORAL_CAPSULE | Freq: Every day | ORAL | Status: AC

## 2011-11-30 NOTE — Progress Notes (Signed)
Occupational Therapy Treatment Patient Details Name: Ann [name] MRN: [id number] DOB: 05-03-1950 Today's Date: 11/30/2011 Time: [id number] OT Time Calculation (min): 18 min  OT Assessment / Plan / Recommendation Comments on Treatment Session Pt is knowledgeable in use of adaptive equipment for LB ADL.  She previously had used a tub transfer bench following knee surgery and wishes to have another ordered for her.  Pt has 2 daughters who are nurse assistants that will be able to help her as needed with ADL.     Follow Up Recommendations  No OT follow up    Barriers to Discharge       Equipment Recommendations  Rolling walker with 5" wheels;Tub/shower bench;3 in 1 bedside comode    Recommendations for Other Services    Frequency     Plan Discharge plan remains appropriate    Precautions / Restrictions Precautions Precautions: Fall Restrictions Weight Bearing Restrictions: Yes RLE Weight Bearing: Weight bearing as tolerated   Pertinent Vitals/Pain Pt c/o muscle spasm pain in R LE, pre medicated, repositioned    ADL  Lower Body Dressing: Performed;Supervision/safety Where Assessed - Lower Body Dressing: Unsupported sitting Toilet Transfer: Simulated;Supervision/safety Toilet Transfer Method: Sit to stand Equipment Used: Gait belt;Reacher;Sock aid;Long-handled sponge;Long-handled shoe horn;Rolling walker Transfers/Ambulation Related to ADLs: transfered and ambulated in room with supervision and RW. ADL Comments: Pt is aware of availability of AE and use.  Only interested in reacher and long sponge.  Educated pt and daughter where they may purchase in the community rather than the entire AE kit.    OT Diagnosis:    OT Problem List:   OT Treatment Interventions:     OT Goals ADL Goals Pt Will Perform Lower Body Bathing: with supervision;with caregiver independent in assisting;Sit to stand from chair;with adaptive equipment;Unsupported ADL Goal: Lower Body Bathing - Progress:  Met Pt Will Perform Lower Body Dressing: with supervision;with caregiver independent in assisting;Sit to stand from chair;Unsupported;with adaptive equipment ADL Goal: Lower Body Dressing - Progress: Met Pt Will Transfer to Toilet: with modified independence;with DME;3-in-1;Ambulation ADL Goal: Toilet Transfer - Progress: Progressing toward goals Pt Will Perform Toileting - Clothing Manipulation: with modified independence;Standing ADL Goal: Toileting - Clothing Manipulation - Progress: Met Pt Will Perform Tub/Shower Transfer: with min assist;with caregiver independent in assisting;Transfer tub bench;with DME;Ambulation ADL Goal: Tub/Shower Transfer - Progress: Met  Visit Information  Last OT Received On: 11/30/11 Assistance Needed: +1    Subjective Data      Prior Functioning       Cognition  Overall Cognitive Status: Appears within functional limits for tasks assessed/performed Arousal/Alertness: Awake/alert Orientation Level: Oriented X4 / Intact Behavior During Session: Ann [name] for tasks performed    Mobility Bed Mobility Bed Mobility: Supine to Sit Supine to Sit: 6: Modified independent (Device/Increase time) Details for Bed Mobility Assistance: Pt able to manage L LE independently.   Transfers Transfers: Sit to Stand;Stand to Sit Sit to Stand: 5: Supervision;From bed;With upper extremity assist Stand to Sit: 5: Supervision;To chair/3-in-1   Exercises    Balance    End of Session OT - End of Session Activity Tolerance: Patient tolerated treatment well Patient left: with call bell/phone within reach;in chair;with family/visitor present   GO     Ann [name] 11/30/2011, 12:03 PM [phone]

## 2011-11-30 NOTE — Progress Notes (Signed)
Physical Therapy Treatment Patient Details Name: Ann [name] MRN: [id number] DOB: 06/13/1950 Today's Date: 11/30/2011 Time: 0830-0910 PT Time Calculation (min): 40 min  PT Assessment / Plan / Recommendation Comments on Treatment Session  See contact note in chart.  Pt left in CPM at 0-50 degrees instructed to increase to 70 degrees in 1 hour.     Follow Up Recommendations  Home health PT;Supervision - Intermittent    Barriers to Discharge        Equipment Recommendations  Rolling walker with 5" wheels;Tub/shower bench;3 in 1 bedside comode    Recommendations for Other Services    Frequency 7X/week   Plan Discharge plan remains appropriate;Frequency remains appropriate    Precautions / Restrictions Precautions Precautions: Fall Restrictions Weight Bearing Restrictions: Yes RLE Weight Bearing: Weight bearing as tolerated   Pertinent Vitals/Pain Pt reports no pain in knee.    Mobility  Bed Mobility Bed Mobility: Supine to Sit Supine to Sit: 6: Modified independent (Device/Increase time) Sit to Supine: 6: Modified independent (Device/Increase time);HOB flat Details for Bed Mobility Assistance: Pt able to manage L LE independently.   Transfers Transfers: Stand to Sit;Sit to Stand Sit to Stand: 5: Supervision;From bed;With upper extremity assist Stand to Sit: 5: Supervision;To chair/3-in-1 Details for Transfer Assistance: no instruction required.  Ambulation/Gait Ambulation/Gait Assistance: 5: Supervision Ambulation Distance (Feet): 150 Feet Assistive device: Rolling walker Ambulation/Gait Assistance Details: Pt demonstrates safety with RW.  Required multiple standing and one sitting rest break secondary to fatigue.   Gait Pattern: Decreased stance time - right;Step-through pattern Gait velocity: WFL Stairs: No Wheelchair Mobility Wheelchair Mobility: No    Exercises Total Joint Exercises Ankle Circles/Pumps: Both;10 reps Quad Sets: Right;5 reps;Supine Short Arc Quad:  10 reps;Right;Supine;AROM Heel Slides: 10 reps;Right;AROM Straight Leg Raises: 5 reps;Right;Supine Goniometric ROM: 0-80 degrees AAROM in supine   PT Diagnosis:    PT Problem List:   PT Treatment Interventions:     PT Goals Acute Rehab PT Goals PT Goal Formulation: With patient Time For Goal Achievement: 12/06/11 Potential to Achieve Goals: Good Pt will go Supine/Side to Sit: with supervision;with HOB 0 degrees PT Goal: Supine/Side to Sit - Progress: Met Pt will go Sit to Supine/Side: with supervision PT Goal: Sit to Supine/Side - Progress: Met Pt will go Sit to Stand: with supervision PT Goal: Sit to Stand - Progress: Met Pt will go Stand to Sit: with supervision PT Goal: Stand to Sit - Progress: Met Pt will Transfer Bed to Chair/Chair to Bed: with supervision PT Transfer Goal: Bed to Chair/Chair to Bed - Progress: Met Pt will Ambulate: 51 - 150 feet;with supervision;with rolling walker PT Goal: Ambulate - Progress: Met  Visit Information  Last PT Received On: 11/30/11 Assistance Needed: +1    Subjective Data  Subjective: I am sleepy Patient Stated Goal: be able to walk   Cognition  Overall Cognitive Status: Appears within functional limits for tasks assessed/performed Arousal/Alertness: Awake/alert Orientation Level: Oriented X4 / Intact Behavior During Session: [facility] for tasks performed    Balance     End of Session PT - End of Session Equipment Utilized During Treatment: Gait belt Activity Tolerance: Patient tolerated treatment well Patient left: in bed;in CPM;with call bell/phone within reach;with family/visitor present Nurse Communication: Mobility status CPM Right Knee CPM Right Knee: Off Right Knee Flexion (Degrees): 55  Right Knee Extension (Degrees): 0    GP     Herberta Pickron 11/30/2011, 12:12 PM [place] Lust L. Sameerah Nachtigal DPT [phone]

## 2011-11-30 NOTE — Progress Notes (Signed)
Subjective: 2 Days Post-Op Procedure(s) (LRB): TOTAL KNEE ARTHROPLASTY (Right) ABLA Activity level:  walking Diet tolerance:  eating Voiding:  ok Patient reports pain as 4 on 0-10 scale.    Objective: Vital signs in last 24 hours: Temp:  [98.3 F (36.8 C)-99.9 F (37.7 C)] 98.3 F (36.8 C) (08/08 0652) Pulse Rate:  [76-91] 76  (08/08 0652) Resp:  [18-20] 18  (08/08 0652) BP: (122-164)/(57-69) 122/57 mmHg (08/08 0652) SpO2:  [94 %-100 %] 94 % (08/08 0652)  Labs:  Basename 11/30/11 0556 11/29/11 0600  HGB 8.0* 9.3*    Basename 11/30/11 0556 11/29/11 0600  WBC 9.8 8.8  RBC 2.77* 3.29*  HCT 23.6* 28.0*  PLT 138* 175    Basename 11/29/11 0600  NA 135  K 4.1  CL 99  CO2 26  BUN 27*  CREATININE 1.90*  GLUCOSE 102*  CALCIUM 9.1   No results found for this basename: LABPT:2,INR:2 in the last 72 hours  Physical Exam:  Neurologically intact ABD soft Neurovascular intact Sensation intact distally Intact pulses distally Dorsiflexion/Plantar flexion intact No cellulitis present Compartment soft Dressing changed  Wound wnl  Assessment/Plan:  2 Days Post-Op Procedure(s) (LRB): TOTAL KNEE ARTHROPLASTY (Right) Advance diet Up with therapy Plan for discharge tomorrow    Rebeckah Masih R 11/30/2011, 12:08 PM

## 2011-11-30 NOTE — Progress Notes (Signed)
PT PROGRESS NOTE: 11/30/2011   Pt has met all acute PT goals and is appropriate for discharge home when cleared by MD.  Today pt ambulated 150 with RW limited by fatigue only.  Pain is well controlled and strength/ROM are significantly improved.  Acute PT will continue to progress pt while in acute setting.   Normajean Nash L. Zohaib Heeney DPT [phone]

## 2011-12-01 DIAGNOSIS — D62 Acute posthemorrhagic anemia: Secondary | ICD-10-CM | POA: Diagnosis not present

## 2011-12-01 LAB — CBC WITH DIFFERENTIAL/PLATELET
Basophils Absolute: 0 10*3/uL (ref 0.0–0.1)
Basophils Relative: 0 % (ref 0–1)
Eosinophils Absolute: 0.2 10*3/uL (ref 0.0–0.7)
Eosinophils Relative: 2 % (ref 0–5)
HCT: 25.8 % — ABNORMAL LOW (ref 36.0–46.0)
Hemoglobin: 8.9 g/dL — ABNORMAL LOW (ref 12.0–15.0)
Lymphocytes Relative: 17 % (ref 12–46)
Lymphs Abs: 1.4 10*3/uL (ref 0.7–4.0)
MCH: 28.1 pg (ref 26.0–34.0)
MCHC: 34.5 g/dL (ref 30.0–36.0)
MCV: 81.4 fL (ref 78.0–100.0)
Monocytes Absolute: 0.7 10*3/uL (ref 0.1–1.0)
Monocytes Relative: 8 % (ref 3–12)
Neutro Abs: 6.1 10*3/uL (ref 1.7–7.7)
Neutrophils Relative %: 73 % (ref 43–77)
Platelets: 143 10*3/uL — ABNORMAL LOW (ref 150–400)
RBC: 3.17 MIL/uL — ABNORMAL LOW (ref 3.87–5.11)
RDW: 13.9 % (ref 11.5–15.5)
WBC: 8.4 10*3/uL (ref 4.0–10.5)

## 2011-12-01 LAB — CBC
HCT: 21.5 % — ABNORMAL LOW (ref 36.0–46.0)
HCT: 22 % — ABNORMAL LOW (ref 36.0–46.0)
Hemoglobin: 7.3 g/dL — ABNORMAL LOW (ref 12.0–15.0)
Hemoglobin: 7.5 g/dL — ABNORMAL LOW (ref 12.0–15.0)
MCH: 28.4 pg (ref 26.0–34.0)
MCH: 28.4 pg (ref 26.0–34.0)
MCHC: 34 g/dL (ref 30.0–36.0)
MCHC: 34.1 g/dL (ref 30.0–36.0)
MCV: 83.7 fL (ref 78.0–100.0)
MCV: 83.3 fL (ref 78.0–100.0)
Platelets: 136 10*3/uL — ABNORMAL LOW (ref 150–400)
Platelets: 136 10*3/uL — ABNORMAL LOW (ref 150–400)
RBC: 2.57 MIL/uL — ABNORMAL LOW (ref 3.87–5.11)
RBC: 2.64 MIL/uL — ABNORMAL LOW (ref 3.87–5.11)
RDW: 14.1 % (ref 11.5–15.5)
RDW: 14 % (ref 11.5–15.5)
WBC: 8.6 10*3/uL (ref 4.0–10.5)
WBC: 8.4 10*3/uL (ref 4.0–10.5)

## 2011-12-01 LAB — GLUCOSE, CAPILLARY
Glucose-Capillary: 113 mg/dL — ABNORMAL HIGH (ref 70–99)
Glucose-Capillary: 136 mg/dL — ABNORMAL HIGH (ref 70–99)
Glucose-Capillary: 122 mg/dL — ABNORMAL HIGH (ref 70–99)

## 2011-12-01 LAB — PREPARE RBC (CROSSMATCH)

## 2011-12-01 NOTE — Progress Notes (Signed)
PT PROGRESS NOTE:  12/01/2011  Pt had difficulty participating in PT today and yesterday due to fatigue.  Pt having difficulty staying awake this morning and has not taken any pain medication to make her sleepy.    Pt reports feeling sleepy all the time.  Daughter confirms that pt sleeps most of the day. Pt's Hgb has dropped each day since she was admitted and is currently 7.3 g/dL. Pt may benefit from further MD evaluation.    Jeray Shugart L. Dreyah Montrose DPT [phone]

## 2011-12-01 NOTE — Progress Notes (Signed)
Utilization review completed. Ichelle Harral, RN, BSN. 

## 2011-12-01 NOTE — Progress Notes (Signed)
Subjective: 3 Days Post-Op Procedure(s) (LRB): TOTAL KNEE ARTHROPLASTY (Right) Acute blood loss anemia Activity level:  Decreased endurance and therapy due to anemia Diet tolerance:  Eating well Voiding:  Voiding okay Patient reports pain as 2 on 0-10 scale.    Objective: Vital signs in last 24 hours: Temp:  [97.6 F (36.4 C)-99.6 F (37.6 C)] 98.4 F (36.9 C) (08/09 1030) Pulse Rate:  [78-92] 78  (08/09 1030) Resp:  [16-18] 16  (08/09 1030) BP: (110-137)/(50-62) 137/58 mmHg (08/09 1030) SpO2:  [95 %-96 %] 96 % (08/08 2055)  Labs:  Basename 12/01/11 1000 12/01/11 0615 11/30/11 0556 11/29/11 0600  HGB 7.5* 7.3* 8.0* 9.3*    Basename 12/01/11 1000 12/01/11 0615  WBC 8.4 8.6  RBC 2.64* 2.57*  HCT 22.0* 21.5*  PLT 136* 136*    Basename 11/29/11 0600  NA 135  K 4.1  CL 99  CO2 26  BUN 27*  CREATININE 1.90*  GLUCOSE 102*  CALCIUM 9.1   No results found for this basename: LABPT:2,INR:2 in the last 72 hours  Physical Exam:  Neurologically intact ABD soft Neurovascular intact Sensation intact distally Intact pulses distally Dorsiflexion/Plantar flexion intact No cellulitis present Compartment soft Knee appears normal with no sign of infection. Lungs are clear and abdomen soft  Assessment/Plan: Acute blood loss anemia 3 Days Post-Op Procedure(s) (LRB): TOTAL KNEE ARTHROPLASTY (Right) Plan for discharge tomorrow Plan to give 2 units of packed RBCs today for blood loss anemia. Will discharge in the a.m. tomorrow due to 2 transportation issues  there is a repeat CBC ordered posttransfusion. Home therapy has been arranged. DVT prophylaxis is ASA 325 one by mouth twice a day x2 weeks. Prescriptions are in her chart.    Brandyn Thien R 12/01/2011, 11:00 AM

## 2011-12-01 NOTE — Progress Notes (Signed)
Due to blood loss anemia will transfuse 2 units of packed red cells and then check a cbc. If good will try to d/c this afternoon or in am

## 2011-12-01 NOTE — Progress Notes (Signed)
Physical Therapy Treatment Patient Details Name: Ann [name] MRN: [id number] DOB: 1950-09-24 Today's Date: 12/01/2011 Time: [id number] PT Time Calculation (min): 29 min  PT Assessment / Plan / Recommendation Comments on Treatment Session  See contact note in chart.  Pt has low Hgb and not tolerating activity as well as previous sessions.    Follow Up Recommendations  Home health PT;Supervision - Intermittent    Barriers to Discharge        Equipment Recommendations  Rolling walker with 5" wheels;Tub/shower bench;3 in 1 bedside comode    Recommendations for Other Services    Frequency 7X/week   Plan Discharge plan remains appropriate;Frequency remains appropriate    Precautions / Restrictions Precautions Precautions: Fall Restrictions Weight Bearing Restrictions: Yes RLE Weight Bearing: Weight bearing as tolerated   Pertinent Vitals/Pain Pt reports no pain and has not had any pain medication this morning.     Mobility  Bed Mobility Bed Mobility: Supine to Sit Supine to Sit: 7: Independent Transfers Transfers: Stand to Sit;Sit to Stand Sit to Stand: 7: Independent Stand to Sit: 7: Independent;To chair/3-in-1;With upper extremity assist Ambulation/Gait Ambulation/Gait Assistance: 5: Supervision (for safety) Ambulation Distance (Feet): 50 Feet Assistive device: Rolling walker Ambulation/Gait Assistance Details: Close supervision for safety as pt felt fatigued and "over heated" .  Pt only able to tolerate ambulating 50 feet.  Gait Pattern: Decreased stance time - right;Step-through pattern Gait velocity: decreased Stairs: No Wheelchair Mobility Wheelchair Mobility: No Modified Rankin (Stroke Patients Only) Pre-Morbid Rankin Score: Slight disability    Exercises     PT Diagnosis:    PT Problem List:   PT Treatment Interventions:     PT Goals Acute Rehab PT Goals PT Goal Formulation: With patient Time For Goal Achievement: 12/06/11 Potential to Achieve Goals:  Good Pt will go Supine/Side to Sit: with HOB 0 degrees;Independently Pt will go Sit to Supine/Side: Independently;with HOB 0 degrees Pt will go Sit to Stand: with modified independence PT Goal: Sit to Stand - Progress: Met Pt will go Stand to Sit: with modified independence;with upper extremity assist PT Goal: Stand to Sit - Progress: Met Pt will Transfer Bed to Chair/Chair to Bed: with modified independence PT Transfer Goal: Bed to Chair/Chair to Bed - Progress: Met Pt will Ambulate: >150 feet;with modified independence;with rolling walker PT Goal: Ambulate - Progress: Not met  Visit Information  Last PT Received On: 12/01/11 Assistance Needed: +1    Subjective Data  Subjective: I feel sleepy   Cognition  Overall Cognitive Status: Appears within functional limits for tasks assessed/performed Arousal/Alertness: Awake/alert Orientation Level: Oriented X4 / Intact Behavior During Session: [facility] for tasks performed    Balance     End of Session PT - End of Session Equipment Utilized During Treatment: Gait belt Activity Tolerance: Patient limited by fatigue Patient left: in chair;with call bell/phone within reach;with family/visitor present Nurse Communication: Mobility status   GP     Lang Zingg 12/01/2011, 8:47 AM Collier Salina L. Shihab States DPT [phone]

## 2011-12-01 NOTE — Discharge Summary (Signed)
Patient ID: Ann [name] MRN: [id number] DOB/AGE: June 24, 1950 61 y.o.  Admit date: 11/28/2011 Discharge date: 12/02/2011  Admission Diagnoses:  Principal Problem:  *DJD (degenerative joint disease) of knee Active Problems:  Acute blood loss anemia   Discharge Diagnoses:  Same  Past Medical History  Diagnosis Date  . Hyperlipidemia 10/21/2009  . Shoulder pain, left 03/16/2009    occurs at night  . Lumbar back pain 02/16/2009    states pain goes down right leg  . Myalgia 02/16/2009  . Chest pain 02/16/2009  . Asthma 09/29/2008  . Anemia 09/29/2008    anemia of chronic disease  . Insulin dependent diabetes mellitus type IA 09/29/2008  . Hypertension 09/29/2008  . CAD (coronary artery disease) 09/29/2008    Stress test 10/2011 nl with EF 58%. [place] card.  . Shortness of breath     with exerction  . Sleep apnea     CPAP  . Chronic kidney disease (CKD), stage III (moderate) 10/20/2009  . GERD (gastroesophageal reflux disease)   . Anginal pain     no recent chest pain    Surgeries: Procedure(s): TOTAL KNEE ARTHROPLASTY on 11/28/2011   Consultants:    Discharged Condition: Improved  Hospital Course: Kennita Schleiger is an 61 y.o. female who was admitted 11/28/2011 for operative treatment ofDJD (degenerative joint disease) of knee. Patient has severe unremitting pain that affects sleep, daily activities, and work/hobbies. After pre-op clearance the patient was taken to the operating room on 11/28/2011 and underwent  Procedure(s): TOTAL KNEE ARTHROPLASTY.  Due to her blood loss anemia 2 units of packed RBCs were transfused prior to her leaving the hospital. Post transfusion CBC pending at time of discharge summary  Patient was given perioperative antibiotics: Anti-infectives     Start     Dose/Rate Route Frequency Ordered Stop   11/28/11 1345   ceFAZolin (ANCEF) IVPB 2 g/50 mL premix        2 g 100 mL/hr over 30 Minutes Intravenous Every 6 hours 11/28/11 1241 11/28/11 2117   11/28/11 0946   cefUROXime (ZINACEF) injection  Status:  Discontinued          As needed 11/28/11 0946 11/28/11 1013   11/27/11 1421   ceFAZolin (ANCEF) IVPB 2 g/50 mL premix        2 g 100 mL/hr over 30 Minutes Intravenous 60 min pre-op 11/27/11 1421 11/28/11 0730           Patient was given sequential compression devices, early ambulation, and chemoprophylaxis to prevent DVT.  Patient benefited maximally from hospital stay and there were no complications.    Recent vital signs: Patient Vitals for the past 24 hrs:  BP Temp Pulse Resp SpO2  12-04-11 1030 137/58 mmHg 98.4 F (36.9 C) 78  16  -  11/30/11 2055 132/62 mmHg 99.6 F (37.6 C) 92  18  96 %  11/30/11 1450 110/50 mmHg 97.6 F (36.4 C) 80  18  95 %     Recent laboratory studies:  Basename 04-Dec-2011 1000 Dec 04, 2011 0615 11/29/11 0600  WBC 8.4 8.6 --  HGB 7.5* 7.3* --  HCT 22.0* 21.5* --  PLT 136* 136* --  NA -- -- 135  K -- -- 4.1  CL -- -- 99  CO2 -- -- 26  BUN -- -- 27*  CREATININE -- -- 1.90*  GLUCOSE -- -- 102*  INR -- -- --  CALCIUM -- -- 9.1     Discharge Medications:   Medication List  As of 12-04-2011  11:05 AM   STOP taking these medications         aspirin 81 MG tablet         TAKE these medications         albuterol 108 (90 BASE) MCG/ACT inhaler   Commonly known as: PROVENTIL HFA;VENTOLIN HFA   Inhale 2 puffs into the lungs every 6 (six) hours as needed. For shortness of breath      amLODipine 10 MG tablet   Commonly known as: NORVASC   Take 10 mg by mouth daily.      aspirin 325 MG EC tablet   Take 1 tablet (325 mg total) by mouth 2 (two) times daily.      celecoxib 200 MG capsule   Commonly known as: CELEBREX   Take 1 capsule (200 mg total) by mouth daily.      HYDROcodone-acetaminophen 10-325 MG per tablet   Commonly known as: NORCO   Take 1-2 tablets by mouth every 4 (four) hours as needed.      insulin aspart 100 UNIT/ML injection   Commonly known as: novoLOG   Inject 5 Units  into the skin 3 (three) times daily before meals.      insulin glargine 100 UNIT/ML injection   Commonly known as: LANTUS   Inject 50 Units into the skin at bedtime.      isosorbide dinitrate 30 MG tablet   Commonly known as: ISORDIL   Take 30 mg by mouth daily.      lisinopril-hydrochlorothiazide 20-12.5 MG per tablet   Commonly known as: PRINZIDE,ZESTORETIC   Take 2 tablets by mouth daily.      methocarbamol 500 MG tablet   Commonly known as: ROBAXIN   Take 1 tablet (500 mg total) by mouth every 6 (six) hours as needed.      metoprolol 50 MG tablet   Commonly known as: LOPRESSOR   Take 50 mg by mouth 2 (two) times daily.      pantoprazole 20 MG tablet   Commonly known as: PROTONIX   Take 20 mg by mouth daily.      pravastatin 40 MG tablet   Commonly known as: PRAVACHOL   Take 40 mg by mouth daily.            Diagnostic Studies: No results found.  Disposition: 01-Home or Self Care  Discharge Orders    Future Appointments: Provider: Department: Dept Phone: Center:   12/14/2011 2:30 PM Neta Ehlers, MD [facility] [phone] [facility]     Future Orders Please Complete By Expires   Diet - low sodium heart healthy      Call MD / Call 911      Comments:   If you experience chest pain or shortness of breath, CALL 911 and be transported to the hospital emergency room.  If you develope a fever above 101 F, pus (white drainage) or increased drainage or redness at the wound, or calf pain, call your surgeon's office.   Constipation Prevention      Comments:   Drink plenty of fluids.  Prune juice may be helpful.  You may use a stool softener, such as Colace (over the counter) 100 mg twice a day.  Use MiraLax (over the counter) for constipation as needed.   Increase activity slowly as tolerated         Follow-up Information    Follow up with Hessie Dibble, MD in 14 days.   Contact information:   [address]  [place] home care agency will provide physical therapy.   Signed: Dshawn Mcnay R 12/01/2011, 11:05 AM

## 2011-12-01 NOTE — Progress Notes (Signed)
Physical Therapy Treatment Patient Details Name: Ann [name] MRN: [id number] DOB: 1950/12/05 Today's Date: 12/01/2011 Time: [id number] PT Time Calculation (min): 22 min  PT Assessment / Plan / Recommendation Comments on Treatment Session  Pt received one of two units of PRBCs prior to PT session. Pt reports feeling much better and tolerated activity better.     Follow Up Recommendations  Home health PT;Supervision - Intermittent    Barriers to Discharge        Equipment Recommendations  Rolling walker with 5" wheels;Tub/shower bench;3 in 1 bedside comode    Recommendations for Other Services    Frequency 7X/week   Plan Discharge plan remains appropriate;Frequency remains appropriate    Precautions / Restrictions Precautions Precautions: Fall Restrictions Weight Bearing Restrictions: Yes RLE Weight Bearing: Weight bearing as tolerated   Pertinent Vitals/Pain Pt reporting no pain in L knee    Mobility  Bed Mobility Bed Mobility: Supine to Sit Supine to Sit: 7: Independent Sit to Supine: 7: Independent Transfers Transfers: Stand to Sit;Sit to Stand Sit to Stand: 6: Modified independent (Device/Increase time);With upper extremity assist;From bed;From chair/3-in-1 Stand to Sit: 6: Modified independent (Device/Increase time);With upper extremity assist;To bed;To chair/3-in-1 Details for Transfer Assistance: no instruction required.  Ambulation/Gait Ambulation/Gait Assistance: 6: Modified independent (Device/Increase time) Ambulation Distance (Feet): 80 Feet Assistive device: Rolling walker Ambulation/Gait Assistance Details: Pt reports feeling less fatigued after ambulating.  Gait Pattern: Decreased stance time - right;Step-through pattern Gait velocity: decreased Stairs: No Wheelchair Mobility Wheelchair Mobility: No Modified Rankin (Stroke Patients Only) Pre-Morbid Rankin Score: Slight disability    Exercises     PT Diagnosis:    PT Problem List:   PT Treatment  Interventions:     PT Goals Acute Rehab PT Goals PT Goal Formulation: With patient Time For Goal Achievement: 12/06/11 Potential to Achieve Goals: Good Pt will go Supine/Side to Sit: with HOB 0 degrees;Independently PT Goal: Supine/Side to Sit - Progress: Met Pt will go Sit to Supine/Side: Independently;with HOB 0 degrees PT Goal: Sit to Supine/Side - Progress: Met Pt will go Sit to Stand: with modified independence PT Goal: Sit to Stand - Progress: Met Pt will go Stand to Sit: with modified independence;with upper extremity assist PT Goal: Stand to Sit - Progress: Met Pt will Transfer Bed to Chair/Chair to Bed: with modified independence PT Transfer Goal: Bed to Chair/Chair to Bed - Progress: Met Pt will Ambulate: >150 feet;with modified independence;with rolling walker PT Goal: Ambulate - Progress: Progressing toward goal  Visit Information  Last PT Received On: 12/01/11    Subjective Data  Subjective: I feel a lot better   Cognition  Overall Cognitive Status: Appears within functional limits for tasks assessed/performed Arousal/Alertness: Awake/alert Orientation Level: Oriented X4 / Intact Behavior During Session: [facility] for tasks performed    Balance     End of Session PT - End of Session Equipment Utilized During Treatment: Gait belt Activity Tolerance: Patient tolerated treatment well Patient left: in bed;with call bell/phone within reach Nurse Communication: Mobility status   GP     Ann [name] 12/01/2011, 5:32 PM Ann [name] L. Kirt Chew DPT [phone]

## 2011-12-02 LAB — TYPE AND SCREEN
ABO/RH(D): O POS
Antibody Screen: NEGATIVE
Unit division: 0
Unit division: 0

## 2011-12-02 LAB — GLUCOSE, CAPILLARY: Glucose-Capillary: 90 mg/dL (ref 70–99)

## 2011-12-02 NOTE — Progress Notes (Signed)
Physical Therapy Treatment Patient Details Name: Ann [name] MRN: [id number] DOB: Jan 21, 1951 Today's Date: 12/02/2011 Time: [id number] PT Time Calculation (min): 18 min  PT Assessment / Plan / Recommendation Comments on Treatment Session  Patient continuing to make great progress. Reviewed HEP and gave handout. Discharging today to home with daughter    Follow Up Recommendations  Home health PT    Barriers to Discharge        Equipment Recommendations  Rolling walker with 5" wheels;3 in 1 bedside comode;Tub/shower seat    Recommendations for Other Services    Frequency 7X/week   Plan Discharge plan remains appropriate;Frequency remains appropriate    Precautions / Restrictions Precautions Precautions: Fall Restrictions RLE Weight Bearing: Weight bearing as tolerated   Pertinent Vitals/Pain     Mobility  Transfers Sit to Stand: 6: Modified independent (Device/Increase time) Stand to Sit: 6: Modified independent (Device/Increase time) Ambulation/Gait Ambulation/Gait Assistance: 6: Modified independent (Device/Increase time) Ambulation Distance (Feet): 150 Feet Assistive device: Rolling walker Gait Pattern: Step-through pattern;Decreased stride length    Exercises Total Joint Exercises Quad Sets: AROM;Right;10 reps Short Arc QuadSinclair Ship;Right;10 reps Heel Slides: AAROM;Right;10 reps Straight Leg Raises: AAROM;Right;10 reps   PT Diagnosis:    PT Problem List:   PT Treatment Interventions:     PT Goals Acute Rehab PT Goals PT Goal: Sit to Stand - Progress: Progressing toward goal PT Goal: Stand to Sit - Progress: Progressing toward goal PT Transfer Goal: Bed to Chair/Chair to Bed - Progress: Progressing toward goal PT Goal: Ambulate - Progress: Progressing toward goal  Visit Information  Last PT Received On: 12/02/11 Assistance Needed: +1    Subjective Data      Cognition  Overall Cognitive Status: Appears within functional limits for tasks  assessed/performed Arousal/Alertness: Awake/alert Orientation Level: Appears intact for tasks assessed Behavior During Session: [facility] for tasks performed    Balance     End of Session PT - End of Session Equipment Utilized During Treatment: Gait belt Activity Tolerance: Patient tolerated treatment well Patient left: with call bell/phone within reach;in bed   GP     Jacqualyn Posey 12/02/2011, 8:13 AM 12/02/2011 Jacqualyn Posey PTA [phone] pager [phone] office

## 2011-12-02 NOTE — Progress Notes (Signed)
PATIENT ID: Ann [name]   4 Days Post-Op Procedure(s) (LRB): TOTAL KNEE ARTHROPLASTY (Right)  Subjective: Doing very well. Feels better after transfusion yesterday. Physical therapy feels she is ready for discharge home.  Objective:  Filed Vitals:   12/02/11 0711  BP: 146/70  Pulse: 74  Temp: 98.7 F (37.1 C)  Resp: 18     Right knee dressing clean dry and intact. No calf tenderness or swelling. She is ambulating well with her walker.  Labs:   [facility] 12/01/11 2047 12/01/11 1000 12/01/11 0615 11/30/11 0556  HGB 8.9* 7.5* 7.3* 8.0*   Basename 12/01/11 2047 12/01/11 1000  WBC 8.4 8.4  RBC 3.17* 2.64*  HCT 25.8* 22.0*  PLT 143* 136*  No results found for this basename: NA:2,K:2,CL:2,CO2:2,BUN:2,CREATININE:2,GLUCOSE:2,CALCIUM:2 in the last 72 hours  Assessment and Plan: Doing well after right total knee replacement Discharged home today with home health services  VTE proph: Aspirin and SCDs

## 2011-12-03 NOTE — Progress Notes (Signed)
Referral received for SNF. Chart reviewed and CSW has spoken with RNCM who indicates that patient is for DC to home with Home Health and DME.  CSW to sign off. Please re-consult if CSW needs arise.  Lorie Phenix. [facility], [place]

## 2011-12-04 LAB — GLUCOSE, CAPILLARY: Glucose-Capillary: 96 mg/dL (ref 70–99)

## 2011-12-13 ENCOUNTER — Other Ambulatory Visit: Payer: Self-pay | Admitting: Internal Medicine

## 2011-12-14 ENCOUNTER — Encounter: Payer: Medicaid Other | Admitting: Internal Medicine

## 2011-12-24 HISTORY — PX: TOTAL KNEE ARTHROPLASTY: SHX125

## 2011-12-26 ENCOUNTER — Ambulatory Visit: Payer: Medicaid Other | Attending: Orthopaedic Surgery

## 2011-12-26 DIAGNOSIS — M25669 Stiffness of unspecified knee, not elsewhere classified: Secondary | ICD-10-CM | POA: Insufficient documentation

## 2011-12-26 DIAGNOSIS — IMO0001 Reserved for inherently not codable concepts without codable children: Secondary | ICD-10-CM | POA: Insufficient documentation

## 2011-12-26 DIAGNOSIS — M6281 Muscle weakness (generalized): Secondary | ICD-10-CM | POA: Insufficient documentation

## 2011-12-26 DIAGNOSIS — M25569 Pain in unspecified knee: Secondary | ICD-10-CM | POA: Insufficient documentation

## 2011-12-26 DIAGNOSIS — R269 Unspecified abnormalities of gait and mobility: Secondary | ICD-10-CM | POA: Insufficient documentation

## 2012-01-02 ENCOUNTER — Ambulatory Visit: Payer: Medicaid Other | Admitting: Physical Therapy

## 2012-01-09 ENCOUNTER — Encounter: Payer: Medicaid Other | Admitting: Rehabilitation

## 2012-01-11 ENCOUNTER — Other Ambulatory Visit: Payer: Self-pay | Admitting: Internal Medicine

## 2012-01-16 ENCOUNTER — Ambulatory Visit: Payer: Medicaid Other | Admitting: Rehabilitation

## 2012-01-17 ENCOUNTER — Ambulatory Visit (INDEPENDENT_AMBULATORY_CARE_PROVIDER_SITE_OTHER): Payer: Medicaid Other | Admitting: Internal Medicine

## 2012-01-17 ENCOUNTER — Encounter: Payer: Self-pay | Admitting: Internal Medicine

## 2012-01-17 VITALS — BP 154/76 | HR 78 | Temp 98.4°F | Wt 228.7 lb

## 2012-01-17 DIAGNOSIS — I10 Essential (primary) hypertension: Secondary | ICD-10-CM

## 2012-01-17 DIAGNOSIS — Z79899 Other long term (current) drug therapy: Secondary | ICD-10-CM

## 2012-01-17 DIAGNOSIS — E109 Type 1 diabetes mellitus without complications: Secondary | ICD-10-CM

## 2012-01-17 DIAGNOSIS — D62 Acute posthemorrhagic anemia: Secondary | ICD-10-CM

## 2012-01-17 DIAGNOSIS — J329 Chronic sinusitis, unspecified: Secondary | ICD-10-CM | POA: Insufficient documentation

## 2012-01-17 LAB — CBC WITH DIFFERENTIAL/PLATELET
Basophils Absolute: 0 10*3/uL (ref 0.0–0.1)
Basophils Relative: 0 % (ref 0–1)
Eosinophils Absolute: 0.1 10*3/uL (ref 0.0–0.7)
Eosinophils Relative: 2 % (ref 0–5)
HCT: 32.4 % — ABNORMAL LOW (ref 36.0–46.0)
Hemoglobin: 10.9 g/dL — ABNORMAL LOW (ref 12.0–15.0)
Lymphocytes Relative: 23 % (ref 12–46)
Lymphs Abs: 2.1 10*3/uL (ref 0.7–4.0)
MCH: 28 pg (ref 26.0–34.0)
MCHC: 33.6 g/dL (ref 30.0–36.0)
MCV: 83.3 fL (ref 78.0–100.0)
Monocytes Absolute: 0.5 10*3/uL (ref 0.1–1.0)
Monocytes Relative: 6 % (ref 3–12)
Neutro Abs: 6.4 10*3/uL (ref 1.7–7.7)
Neutrophils Relative %: 69 % (ref 43–77)
Platelets: 260 10*3/uL (ref 150–400)
RBC: 3.89 MIL/uL (ref 3.87–5.11)
RDW: 15 % (ref 11.5–15.5)
WBC: 9.2 10*3/uL (ref 4.0–10.5)

## 2012-01-17 LAB — LIPID PANEL
Cholesterol: 149 mg/dL (ref 0–200)
HDL: 35 mg/dL — ABNORMAL LOW (ref >39)
LDL Cholesterol: 86 mg/dL (ref 0–99)
Total CHOL/HDL Ratio: 4.3 Ratio
Triglycerides: 141 mg/dL (ref <150)
VLDL: 28 mg/dL (ref 0–40)

## 2012-01-17 LAB — GLUCOSE, CAPILLARY: Glucose-Capillary: 69 mg/dL — ABNORMAL LOW (ref 70–99)

## 2012-01-17 LAB — POCT GLYCOSYLATED HEMOGLOBIN (HGB A1C): Hemoglobin A1C: 6.5

## 2012-01-17 MED ORDER — DAILY-VITAMIN/IRON PO TABS: ORAL_TABLET | ORAL | Status: DC

## 2012-01-17 MED ORDER — INSULIN GLARGINE 100 UNIT/ML ~~LOC~~ SOLN: SUBCUTANEOUS | Status: DC

## 2012-01-17 MED ORDER — INSULIN ASPART 100 UNIT/ML ~~LOC~~ SOLN: SUBCUTANEOUS | Status: DC

## 2012-01-17 MED ORDER — AMOXICILLIN-POT CLAVULANATE 875-125 MG PO TABS: 1.0000 | ORAL_TABLET | Freq: Two times a day (BID) | ORAL | Status: DC

## 2012-01-17 MED ORDER — FLUTICASONE PROPIONATE 50 MCG/ACT NA SUSP: 1.0000 | Freq: Every day | NASAL | Status: DC

## 2012-01-17 MED ORDER — CETIRIZINE HCL 10 MG PO CHEW: 10.0000 mg | CHEWABLE_TABLET | Freq: Every day | ORAL | Status: DC

## 2012-01-17 NOTE — Assessment & Plan Note (Signed)
HbA1c is improving to 6.5 today.  I think Lantus 50 and Novolog 5units TID might be a little too much for her given her hypoglycemia episodes.  I reviewed her meter and it ranges from 57-255 with several values below 70 in the morning, mostly around lunch and evening as well. -Decrease  Lantus to 45 units at bedtime -Decrease Novolog to 3 units with meal and if  blood sugar is less than 100 before meal time, do not take Novolog insulin -continue checking 3 times per day -Will sign form for diabetic shoe since her PCP is on night float -Follow up in 4 weeks to see if we need to readjust her insulin regimen -She is instructed to call our office if her blood sugar is below 70.

## 2012-01-17 NOTE — Assessment & Plan Note (Signed)
Patient Hb was 7.3 post knee surgery and received 2 units of blood transfusion.  Her baseline Hb ~9.   -Will repeat CBC -Check anemia panel

## 2012-01-17 NOTE — Progress Notes (Signed)
HPI: Ann [name] is a 61 yo woman with PMH of DM, HTN, CKD III, presents today because she missed her appt last month because of right knee replacement.  Hb was 7.3 post surgery so she had 2 units of blood transfusion.  Had colonoscopy in 2011 with [place] and said it was normal.  Allergy symptoms in the past several weeks.  She has sneezing, burning eyes, stuff up nose.  No nasal drainage. +headache in frontal area, no fever or chills.  She has not tried anything over the counter.    DM: she checks 4-5 times per day. CBGs range 65-200's.  She states that she has 2-3 times per week tthat her blood sugar is less than 65 with symptoms of shaking, nervous and hungry.  She would drink juice or eat sandwich. No hx of hospitalization for hypoglycemia.  These hypoglycemic episodes happen overnight and early morning. Her CBG was 69 in the office today because she did not eat her breakfast.    ROS: as per HPI  PE: General: alert, well-developed, and cooperative to examination.  Head:tenderness to palpation of the frontal sinus, no maxillary tenderness.  Nasal mucosal erythematous, mild purulent discharge.  No oral exudates or erythema  Lungs: normal respiratory effort, no accessory muscle use, normal breath sounds, no crackles, and no wheezes. Heart: normal rate, regular rhythm, no murmur, no gallop, and no rub.  Abdomen: soft, non-tender, normal bowel sounds, no distention, no guarding, no rebound tenderness Pulses: 2+ DP/PT pulses bilaterally Extremities: No cyanosis, clubbing, +1 pitting edema R>L, no tenderness Neurologic: nonfocal, and walking with a cane  Skin: turgor normal and no rashes.  Psych: Oriented X3, memory intact for recent and remote, normally interactive, good eye contact, not anxious appearing, and not depressed appearing.

## 2012-01-17 NOTE — Patient Instructions (Addendum)
Decrease your Lantus to 45 units at bedtime Decrease Novolog to 3 units with meal and if your blood sugar is less than 100 before meal time, do not take your Novolog insulin Start taking Augmentin one tablet twice daily x 14 days Take Flonase as prescribed Take Zyrtec 10mg  one tablet daily Follow up in 4 weeks and bring your meter

## 2012-01-17 NOTE — Assessment & Plan Note (Signed)
Signs and symptoms of sinusitis. -Will treat with augmentin 875mg  pobid x 14 days -Flonase nasal spray -Zyrtec 10mg  po qd

## 2012-01-17 NOTE — Assessment & Plan Note (Signed)
Well-controlled, continue current regimen 

## 2012-01-18 LAB — ANEMIA PANEL
%SAT: 16 % — ABNORMAL LOW (ref 20–55)
ABS Retic: 73.9 10*3/uL (ref 19.0–186.0)
Ferritin: 397 ng/mL — ABNORMAL HIGH (ref 10–291)
Folate: >20 ng/mL
Iron: 38 ug/dL — ABNORMAL LOW (ref 42–145)
RBC.: 3.89 MIL/uL (ref 3.87–5.11)
Retic Ct Pct: 1.9 % (ref 0.4–2.3)
TIBC: 237 ug/dL — ABNORMAL LOW (ref 250–470)
UIBC: 199 ug/dL (ref 125–400)
Vitamin B-12: 369 pg/mL (ref 211–911)

## 2012-01-19 ENCOUNTER — Ambulatory Visit: Payer: Medicaid Other | Admitting: Rehabilitation

## 2012-01-23 ENCOUNTER — Ambulatory Visit: Payer: Medicaid Other | Attending: Orthopaedic Surgery | Admitting: Rehabilitation

## 2012-01-23 DIAGNOSIS — R293 Abnormal posture: Secondary | ICD-10-CM | POA: Insufficient documentation

## 2012-01-23 DIAGNOSIS — IMO0001 Reserved for inherently not codable concepts without codable children: Secondary | ICD-10-CM | POA: Insufficient documentation

## 2012-01-23 DIAGNOSIS — M6281 Muscle weakness (generalized): Secondary | ICD-10-CM | POA: Insufficient documentation

## 2012-01-23 DIAGNOSIS — Z96659 Presence of unspecified artificial knee joint: Secondary | ICD-10-CM | POA: Insufficient documentation

## 2012-01-23 DIAGNOSIS — M25569 Pain in unspecified knee: Secondary | ICD-10-CM | POA: Insufficient documentation

## 2012-01-23 DIAGNOSIS — M25669 Stiffness of unspecified knee, not elsewhere classified: Secondary | ICD-10-CM | POA: Insufficient documentation

## 2012-01-26 ENCOUNTER — Ambulatory Visit: Payer: Medicaid Other | Admitting: Rehabilitation

## 2012-01-30 ENCOUNTER — Ambulatory Visit: Payer: Medicaid Other | Admitting: Physical Therapy

## 2012-02-01 ENCOUNTER — Other Ambulatory Visit: Payer: Self-pay | Admitting: Internal Medicine

## 2012-02-01 ENCOUNTER — Ambulatory Visit: Payer: Medicaid Other | Admitting: Rehabilitation

## 2012-02-02 ENCOUNTER — Other Ambulatory Visit: Payer: Self-pay | Admitting: *Deleted

## 2012-02-02 MED ORDER — INSULIN PEN NEEDLE 31G X 8 MM MISC: Status: AC

## 2012-02-05 ENCOUNTER — Other Ambulatory Visit: Payer: Self-pay | Admitting: Internal Medicine

## 2012-02-23 ENCOUNTER — Other Ambulatory Visit: Payer: Self-pay | Admitting: Internal Medicine

## 2012-02-23 DIAGNOSIS — Z1231 Encounter for screening mammogram for malignant neoplasm of breast: Secondary | ICD-10-CM

## 2012-03-07 ENCOUNTER — Other Ambulatory Visit: Payer: Self-pay | Admitting: Internal Medicine

## 2012-03-07 NOTE — Telephone Encounter (Signed)
Insulin change last appt. [name] for F/U. Has Dec appt with PCP. Will refill

## 2012-03-14 ENCOUNTER — Encounter: Payer: Medicaid Other | Admitting: Internal Medicine

## 2012-03-25 ENCOUNTER — Ambulatory Visit (INDEPENDENT_AMBULATORY_CARE_PROVIDER_SITE_OTHER): Payer: Medicaid Other | Admitting: Internal Medicine

## 2012-03-25 ENCOUNTER — Encounter: Payer: Self-pay | Admitting: Internal Medicine

## 2012-03-25 VITALS — BP 109/61 | HR 78 | Temp 98.9°F | Ht 63.0 in | Wt 252.2 lb

## 2012-03-25 DIAGNOSIS — E1165 Type 2 diabetes mellitus with hyperglycemia: Secondary | ICD-10-CM | POA: Insufficient documentation

## 2012-03-25 DIAGNOSIS — E1122 Type 2 diabetes mellitus with diabetic chronic kidney disease: Secondary | ICD-10-CM | POA: Insufficient documentation

## 2012-03-25 DIAGNOSIS — E1121 Type 2 diabetes mellitus with diabetic nephropathy: Secondary | ICD-10-CM

## 2012-03-25 DIAGNOSIS — E1142 Type 2 diabetes mellitus with diabetic polyneuropathy: Secondary | ICD-10-CM

## 2012-03-25 DIAGNOSIS — Z0000 Encounter for general adult medical examination without abnormal findings: Secondary | ICD-10-CM

## 2012-03-25 DIAGNOSIS — E1149 Type 2 diabetes mellitus with other diabetic neurological complication: Secondary | ICD-10-CM

## 2012-03-25 DIAGNOSIS — E1021 Type 1 diabetes mellitus with diabetic nephropathy: Secondary | ICD-10-CM

## 2012-03-25 DIAGNOSIS — E109 Type 1 diabetes mellitus without complications: Secondary | ICD-10-CM

## 2012-03-25 DIAGNOSIS — Z23 Encounter for immunization: Secondary | ICD-10-CM

## 2012-03-25 LAB — GLUCOSE, CAPILLARY: Glucose-Capillary: 84 mg/dL (ref 70–99)

## 2012-03-25 NOTE — Assessment & Plan Note (Signed)
-  last colonoscopy in 2010 showed tubular adenoma. She will need a repeat colonoscopy next year. -she is due for a mammogram. Scheduled to get one this Friday.

## 2012-03-25 NOTE — Patient Instructions (Addendum)
Please return to clinic in 3 months.

## 2012-03-25 NOTE — Progress Notes (Addendum)
Internal Medicine Clinic Visit    HPI:  Ann [name] is a 61 y.o. year old female who presents for diabetic foot exam. She also states that she had been trying to lose her "belly fat" for the past year without success. She says she tries to eat healthy, lots of veggies, doesn't eat red meat, tries to exercise every other day. She used to walk but that is limited by pain recently. She does some exercises at home and household tasks mostly.   Past Medical History  Diagnosis Date  . Hyperlipidemia 10/21/2009  . Shoulder pain, left 03/16/2009    occurs at night  . Lumbar back pain 02/16/2009    states pain goes down right leg  . Myalgia 02/16/2009  . Chest pain 02/16/2009  . Asthma 09/29/2008  . Anemia 09/29/2008    anemia of chronic disease  . Insulin dependent diabetes mellitus type IA 09/29/2008  . Hypertension 09/29/2008  . CAD (coronary artery disease) 09/29/2008    Stress test 10/2011 nl with EF 58%. [place] card.  . Shortness of breath     with exerction  . Sleep apnea     CPAP  . Chronic kidney disease (CKD), stage III (moderate) 10/20/2009  . GERD (gastroesophageal reflux disease)   . Anginal pain     no recent chest pain    Past Surgical History  Procedure Date  . Appendectomy   . Cholecystectomy   . Total knee arthroplasty     Left  . Abdominal hysterectomy   . Joint replacement     left knee  . Total knee arthroplasty 11/28/2011    Procedure: TOTAL KNEE ARTHROPLASTY;  Surgeon: Hessie Dibble, MD;  Location: [facility];  Service: Orthopedics;  Laterality: Right;     ROS:  A complete review of systems was otherwise negative, except as noted in the HPI.  Allergies: Codeine and Metronidazole  Medications: Current Outpatient Prescriptions  Medication Sig Dispense Refill  . amLODipine (NORVASC) 10 MG tablet Take 10 mg by mouth daily.      Marland Kitchen amLODipine (NORVASC) 10 MG tablet TAKE 1 TABLET BY MOUTH DAILY  30 tablet  PRN  . amoxicillin-clavulanate (AUGMENTIN)  875-125 MG per tablet Take 1 tablet by mouth 2 (two) times daily.  28 tablet  0  . cetirizine (ZYRTEC) 10 MG chewable tablet Chew 1 tablet (10 mg total) by mouth daily.  30 tablet  3  . fluticasone (FLONASE) 50 MCG/ACT nasal spray INHALE 1 SPRAY IN EACH NOSTRIL EVERY DAY  16 g  2  . insulin aspart (NOVOLOG FLEXPEN) 100 UNIT/ML injection Inject 3 units before each meal  1 pen  12  . insulin glargine (LANTUS) 100 UNIT/ML injection Inject 45 units at bedtime  10 mL  3  . Insulin Pen Needle (EASY TOUCH PEN NEEDLES) 31G X 8 MM MISC Use to inject insulin as instructed.  100 each  11  . isosorbide dinitrate (ISORDIL) 30 MG tablet Take 30 mg by mouth daily.        Marland Kitchen lisinopril-hydrochlorothiazide (PRINZIDE,ZESTORETIC) 20-12.5 MG per tablet Take 2 tablets by mouth daily.      Marland Kitchen lisinopril-hydrochlorothiazide (PRINZIDE,ZESTORETIC) 20-12.5 MG per tablet TAKE 2 TABLETS BY MOUTH EVERY DAY  60 tablet  PRN  . lisinopril-hydrochlorothiazide (PRINZIDE,ZESTORETIC) 20-12.5 MG per tablet TAKE 2 TABLETS BY MOUTH EVERY DAY  60 tablet  PRN  . lisinopril-hydrochlorothiazide (PRINZIDE,ZESTORETIC) 20-12.5 MG per tablet TAKE 2 TABLETS BY MOUTH EVERY DAY  60 tablet  1  . metoprolol (LOPRESSOR)  50 MG tablet Take 50 mg by mouth 2 (two) times daily.      . metoprolol (LOPRESSOR) 50 MG tablet TAKE 1 TABLET BY MOUTH TWICE DAILY  60 tablet  PRN  . metoprolol (LOPRESSOR) 50 MG tablet TAKE 1 TABLET BY MOUTH TWICE DAILY  60 tablet  PRN  . metoprolol (LOPRESSOR) 50 MG tablet TAKE 1 TABLET BY MOUTH TWICE DAILY  60 tablet  1  . Multiple Vitamins-Iron (DAILY-VITAMIN/IRON) TABS Take one tablet daily  30 tablet  0  . pantoprazole (PROTONIX) 20 MG tablet Take 20 mg by mouth daily.      . pantoprazole (PROTONIX) 20 MG tablet TAKE 1 TABLET BY MOUTH DAILY  30 tablet  PRN  . pantoprazole (PROTONIX) 20 MG tablet TAKE 1 TABLET BY MOUTH DAILY  30 tablet  PRN  . pantoprazole (PROTONIX) 20 MG tablet TAKE 1 TABLET BY MOUTH DAILY  30 tablet  3  .  pravastatin (PRAVACHOL) 40 MG tablet TAKE ONE TABLET BY MOUTH EVERY EVENING  31 tablet  6  . PROAIR HFA 108 (90 BASE) MCG/ACT inhaler INHALE 1-2 PUFFS BY MOUTH EVERY 4 HOURS AS NEEDED FOR SHORTNESS OF BREATH  8.5 each  PRN    History   Social History  . Marital Status: Legally Separated    Spouse Name: N/A    Number of Children: N/A  . Years of Education: N/A   Occupational History  . Not on file.   Social History Main Topics  . Smoking status: Never Smoker   . Smokeless tobacco: Current User    Types: Snuff  . Alcohol Use: No  . Drug Use: No  . Sexually Active: Not on file   Other Topics Concern  . Not on file   Social History Narrative  . No narrative on file    family history is not on file.  Physical Exam Blood pressure 109/61, pulse 78, temperature 98.9 F (37.2 C), temperature source Oral, height 5\' 3"  (1.6 m), weight 252 lb 3.2 oz (114.397 kg), SpO2 97.00%. General:  No acute distress, alert and oriented x 3, well-appearing  HEENT:  PERRL, EOMI, no lymphadenopathy, moist mucous membranes Cardiovascular:  Regular rate and rhythm, no murmurs, rubs or gallops Respiratory:  Clear to auscultation bilaterally, no wheezes, rales, or rhonchi Abdomen:  Soft, nondistended, nontender, normoactive bowel sounds Extremities:  Warm, no foot ulcers, no callouses, sensation to monofilament intact b/l Skin: Warm, dry, no rashes Neuro: Not anxious appearing, no depressed mood, normal affect  Labs: Lab Results  Component Value Date   CREATININE 1.90* 11/29/2011   BUN 27* 11/29/2011   NA 135 11/29/2011   K 4.1 11/29/2011   CL 99 11/29/2011   CO2 26 11/29/2011   Lab Results  Component Value Date   WBC 9.2 01/17/2012   HGB 10.9* 01/17/2012   HCT 32.4* 01/17/2012   MCV 83.3 01/17/2012   PLT 260 01/17/2012      Assessment and Plan:  Please see problem based charting.  FOLLOWUP: Ann [name] will follow back up in our clinic in approximately  3 months. Ann [name] knows to call out  clinic in the meantime with any questions or new issues.   Patient was seen and evaluated by Santa Lighter, MD,  and discussed with Dominic Pea, DO Attending Physician.      INTERNAL MEDICINE TEACHING ATTENDING ADDENDUM - Dominic Pea, DO: I reviewed with the resident Santa Lighter, MD  medical history, physical examination, diagnosis and results of tests and treatment  and I agree with the patient's care as documented.   Dominic Pea, DO  03/27/2012, 10:06 AM

## 2012-03-25 NOTE — Assessment & Plan Note (Deleted)
Foot exam today that was normal, monofilament sensation intact throughout. Last eye exam was in spring 2013 but she cannot remember the name of her ophthalmologist. She reports the exam was 'fine' but she also had 'blood in the back of her eye.' Will ask patient to let us know the name of her ophthalmologist so we can get in touch with him/her. In the mean time, we will continue to monitor blood pressure and glycemic control. Last A1c was 6.5. Cont on current regimen. Diabetic shoe approval form filled out in November, patient asked to call us if there are any problems.

## 2012-03-25 NOTE — Assessment & Plan Note (Signed)
Foot exam today that was normal, monofilament sensation intact throughout. Last microalbumin was 239 - evidence of diabetic nephropathy. Last eye exam was in spring 2013 but she cannot remember the name of her ophthalmologist. She reports the exam was 'fine' but she also had 'blood in the back of her eye.' Will ask patient to let us know the name of her ophthalmologist so we can get in touch with him/her. In the mean time, we will continue to monitor blood pressure and glycemic control. Last A1c was 6.5. Cont on current regimen. Diabetic shoe approval form filled out in November, patient asked to call us if there are any problems.

## 2012-03-29 ENCOUNTER — Ambulatory Visit
Admission: RE | Admit: 2012-03-29 | Discharge: 2012-03-29 | Disposition: A | Discharge status: Home / Self Care | Payer: Medicaid Other | Source: Ambulatory Visit | Attending: Internal Medicine | Admitting: Internal Medicine

## 2012-03-29 DIAGNOSIS — Z1231 Encounter for screening mammogram for malignant neoplasm of breast: Secondary | ICD-10-CM

## 2012-04-26 ENCOUNTER — Telehealth: Payer: Self-pay | Admitting: *Deleted

## 2012-04-26 NOTE — Telephone Encounter (Signed)
ERROR

## 2012-05-24 ENCOUNTER — Other Ambulatory Visit: Payer: Self-pay | Admitting: Internal Medicine

## 2012-06-03 ENCOUNTER — Ambulatory Visit: Payer: Medicaid Other | Attending: Orthopaedic Surgery | Admitting: Physical Therapy

## 2012-06-03 DIAGNOSIS — M6281 Muscle weakness (generalized): Secondary | ICD-10-CM | POA: Insufficient documentation

## 2012-06-03 DIAGNOSIS — Z96659 Presence of unspecified artificial knee joint: Secondary | ICD-10-CM | POA: Insufficient documentation

## 2012-06-03 DIAGNOSIS — M25669 Stiffness of unspecified knee, not elsewhere classified: Secondary | ICD-10-CM | POA: Insufficient documentation

## 2012-06-03 DIAGNOSIS — M25569 Pain in unspecified knee: Secondary | ICD-10-CM | POA: Insufficient documentation

## 2012-06-03 DIAGNOSIS — IMO0001 Reserved for inherently not codable concepts without codable children: Secondary | ICD-10-CM | POA: Insufficient documentation

## 2012-06-03 DIAGNOSIS — R269 Unspecified abnormalities of gait and mobility: Secondary | ICD-10-CM | POA: Insufficient documentation

## 2012-06-28 ENCOUNTER — Other Ambulatory Visit: Payer: Self-pay | Admitting: Internal Medicine

## 2012-07-31 ENCOUNTER — Other Ambulatory Visit: Payer: Self-pay | Admitting: Internal Medicine

## 2012-08-29 ENCOUNTER — Other Ambulatory Visit: Payer: Self-pay | Admitting: Internal Medicine

## 2012-08-29 DIAGNOSIS — N183 Chronic kidney disease, stage 3 unspecified: Secondary | ICD-10-CM

## 2012-09-03 ENCOUNTER — Ambulatory Visit
Admission: RE | Admit: 2012-09-03 | Discharge: 2012-09-03 | Disposition: A | Discharge status: Home / Self Care | Payer: Medicaid Other | Source: Ambulatory Visit | Attending: Internal Medicine | Admitting: Internal Medicine

## 2012-09-03 DIAGNOSIS — N183 Chronic kidney disease, stage 3 unspecified: Secondary | ICD-10-CM

## 2012-10-14 ENCOUNTER — Telehealth: Payer: Self-pay | Admitting: *Deleted

## 2012-10-14 ENCOUNTER — Other Ambulatory Visit: Payer: Self-pay | Admitting: Internal Medicine

## 2012-10-14 MED ORDER — ISOSORBIDE MONONITRATE ER 30 MG PO TB24: 30.0000 mg | ORAL_TABLET | Freq: Every day | ORAL | Status: DC

## 2012-10-14 NOTE — Telephone Encounter (Signed)
Isosorbide [place] refilled w/[facility]

## 2012-10-31 ENCOUNTER — Other Ambulatory Visit: Payer: Self-pay

## 2012-12-25 ENCOUNTER — Other Ambulatory Visit: Payer: Self-pay | Admitting: Orthopaedic Surgery

## 2012-12-25 DIAGNOSIS — I739 Peripheral vascular disease, unspecified: Secondary | ICD-10-CM

## 2012-12-25 DIAGNOSIS — M545 Low back pain, unspecified: Secondary | ICD-10-CM

## 2013-01-03 ENCOUNTER — Other Ambulatory Visit: Payer: Medicaid Other

## 2013-01-22 ENCOUNTER — Encounter: Payer: Self-pay | Admitting: Cardiology

## 2013-01-27 ENCOUNTER — Ambulatory Visit: Payer: Medicaid Other | Admitting: Physician Assistant

## 2013-01-28 ENCOUNTER — Ambulatory Visit (INDEPENDENT_AMBULATORY_CARE_PROVIDER_SITE_OTHER): Payer: Medicaid Other | Admitting: Physician Assistant

## 2013-01-28 ENCOUNTER — Encounter: Payer: Self-pay | Admitting: Physician Assistant

## 2013-01-28 DIAGNOSIS — I2510 Atherosclerotic heart disease of native coronary artery without angina pectoris: Secondary | ICD-10-CM

## 2013-01-28 DIAGNOSIS — I10 Essential (primary) hypertension: Secondary | ICD-10-CM

## 2013-01-28 NOTE — Assessment & Plan Note (Addendum)
The most important thing she needs to work on is her sky-rocketing weight.  I recommended nutritional consult, however, I do not think she can afford it.  I did provide the contact information should she wish to persue it.

## 2013-01-28 NOTE — Assessment & Plan Note (Signed)
BP is controlled at this time.

## 2013-01-28 NOTE — Patient Instructions (Addendum)
Follow up with Dr. Gwenlyn Found in six months.

## 2013-01-28 NOTE — Progress Notes (Signed)
Date:  01/28/2013   ID:  Ann [name], DOB 1950/11/29, MRN [id number]  PCP:  Philis Fendt, MD  Primary Cardiologist:  Gwenlyn Found     History of Present Illness: Ann [name] is a 62 y.o. doese,  single African American female mother of 65, grandmother to 6 grandchildren who saw Dr. Gwenlyn Found on 07/25/12.  She has a history of PCI of her right coronary artery at [facility] in the past. Her other problems include hypertension, hyperlipidemia, non-insulin-requiring diabetes and obstructive sleep apnea on CPAP. She had a Myoview stress test performed in November of 2010 which showed inferior ischemia, and a cath performed by Dr. Gwenlyn Found at [facility] on March 08, 2009 via the right radial approach revealed patent stents in the mid and distal RCA with high-grade disease in her proximal and mid AV groove circumflex as well as her obtuse marginal branch, as well as moderate disease in her proximal and mid LAD with normal LV function. Her circumflex vessels were tortuous. He elected to treat her medically.  She had an uncomplicated total knee replacement performed by Dr. Rhona Raider 9/13. The last lipid profile performed in September of last year revealed a total cholesterol of 148, LDL of 86 and HDL of 35.   The patient presents for six month evaluation.  She reports some sharp left and right neck pain which is intermittent and sometimes worse with movement.  It tends to resolve on its own.  She also reports chronic right and left Abd pain with palpation, LEE but otherwise denies nausea, vomiting, fever, chest pain, shortness of breath, orthopnea, dizziness, PND, cough, congestion, abdominal pain, hematochezia, melena, lower extremity edema, claudication.  She has a CPAP but does not use it as frequently as she should.  Her weight continues to go up and she is now 40# greater than one year ago.    Wt Readings from Last 3 Encounters:  01/28/13 268 lb 11.2 oz (121.882 kg)  03/25/12 252 lb 3.2 oz (114.397 kg)    01/17/12 228 lb 11.2 oz (103.738 kg)     Past Medical History  Diagnosis Date  . Hyperlipidemia 10/21/2009  . Shoulder pain, left 03/16/2009    occurs at night  . Lumbar back pain 02/16/2009    states pain goes down right leg  . Myalgia 02/16/2009  . Chest pain 02/16/2009  . Asthma 09/29/2008  . Anemia 09/29/2008    anemia of chronic disease  . Insulin dependent diabetes mellitus type IA 09/29/2008  . Hypertension 09/29/2008  . CAD (coronary artery disease) 09/29/2008    hx  PCI - [facility]. Stress test 10/2011 nl with EF 58%. [place] card., last cath 2010  . Shortness of breath     with exerction  . Sleep apnea     CPAP  . Chronic kidney disease (CKD), stage III (moderate) 10/20/2009  . GERD (gastroesophageal reflux disease)   . Anginal pain     no recent chest pain    Current Outpatient Prescriptions  Medication Sig Dispense Refill  . ACCU-CHEK FASTCLIX LANCETS MISC USE AS DIRECTED TO CHECK BLOOD SUGAR FOUR TIMES A DAY(BEFORE MEALS AND AT BEDTIME).  102 each  9  . ACCU-CHEK SMARTVIEW test strip CHECK BLOOD SUGAR BEFORE MEALS AND AT BEDTIME  150 each  9  . amLODipine (NORVASC) 10 MG tablet TAKE 1 TABLET BY MOUTH DAILY  30 tablet  PRN  . cetirizine (ZYRTEC) 10 MG chewable tablet Chew 1 tablet (10 mg total) by  mouth daily.  30 tablet  3  . diclofenac sodium (VOLTAREN) 1 % GEL Apply topically as needed.      . fluticasone (FLONASE) 50 MCG/ACT nasal spray INHALE 1 SPRAY IN EACH NOSTRIL EVERY DAY  16 g  PRN  . insulin aspart (NOVOLOG FLEXPEN) 100 UNIT/ML injection Inject 3 units before each meal  1 pen  12  . Insulin Pen Needle (EASY TOUCH PEN NEEDLES) 31G X 8 MM MISC Use to inject insulin as instructed.  100 each  11  . isosorbide mononitrate (IMDUR) 30 MG 24 hr tablet Take 1 tablet (30 mg total) by mouth daily.  30 tablet  6  . LANTUS SOLOSTAR 100 UNIT/ML injection INJECT 45 UNITS SUBCUTANEOUSLY EVERY NIGHT AT BEDTIME  15 mL  6  . lisinopril-hydrochlorothiazide  (PRINZIDE,ZESTORETIC) 20-12.5 MG per tablet TAKE 2 TABLETS BY MOUTH EVERY DAY  60 tablet  1  . metoprolol (LOPRESSOR) 50 MG tablet       . Multiple Vitamins-Iron (DAILY-VITAMIN/IRON) TABS Take one tablet daily  30 tablet  0  . pantoprazole (PROTONIX) 20 MG tablet TAKE 1 TABLET BY MOUTH DAILY  30 tablet  3  . pravastatin (PRAVACHOL) 40 MG tablet TAKE ONE TABLET BY MOUTH EVERY EVENING  30 tablet  PRN  . PROAIR HFA 108 (90 BASE) MCG/ACT inhaler INHALE 1-2 PUFFS BY MOUTH EVERY 4 HOURS AS NEEDED FOR SHORTNESS OF BREATH  8.5 each  PRN   No current facility-administered medications for this visit.    Allergies:    Allergies  Allergen Reactions  . Codeine     REACTION: nausea and vomiting  . Metronidazole     REACTION: itching    Social History:  The patient  reports that she has never smoked. Her smokeless tobacco use includes Snuff. She reports that she does not drink alcohol or use illicit drugs.   Family history:   Family History  Problem Relation Age of Onset  . Diabetes Mother   . Hypertension Mother   . Heart disease Mother   . Heart attack Father   . Heart disease Sister   . Heart disease Brother     ROS:  Please see the history of present illness.  All other systems reviewed and negative.   PHYSICAL EXAM: VS:  BP 130/78  Pulse 71  Ht 5\' 3"  (1.6 m)  Wt 268 lb 11.2 oz (121.882 kg)  BMI 47.61 kg/m2 Obese, well developed, in no acute distress HEENT: Pupils are equal round react to light accommodation extraocular movements are intact.  Neck: no JVDNo cervical lymphadenopathy. Cardiac: Regular rate and rhythm without murmurs rubs or gallops. Lungs:  clear to auscultation bilaterally, no wheezing, rhonchi or rales Abd: soft, tender with light palpation in the right and left upper quads, positive bowel sounds all quadrants,  [id number] L>R lower extremity edema.  2+ radial and dorsalis pedis pulses. Skin: warm and dry Neuro:  Grossly normal  EKG:    NSR 71BPM  ASSESSMENT  AND PLAN:  Problem List Items Addressed This Visit   Morbid obesity - Primary     The most important thing she needs to work on is her sky-rocketing weight.  I recommended nutritional consult, however, I do not think she can afford it.  I did provide the contact information should she wish to persue it.    HYPERTENSION     BP is controlled at this time.    Relevant Medications      metoprolol (LOPRESSOR) 50 MG tablet

## 2013-02-18 ENCOUNTER — Other Ambulatory Visit: Payer: Self-pay | Admitting: Internal Medicine

## 2013-02-26 ENCOUNTER — Other Ambulatory Visit: Payer: Self-pay

## 2013-02-26 DIAGNOSIS — Z1231 Encounter for screening mammogram for malignant neoplasm of breast: Secondary | ICD-10-CM

## 2013-03-15 ENCOUNTER — Ambulatory Visit
Admission: RE | Admit: 2013-03-15 | Discharge: 2013-03-15 | Disposition: A | Discharge status: Home / Self Care | Payer: Medicaid Other | Source: Ambulatory Visit | Attending: Orthopaedic Surgery | Admitting: Orthopaedic Surgery

## 2013-03-15 DIAGNOSIS — I739 Peripheral vascular disease, unspecified: Secondary | ICD-10-CM

## 2013-03-15 DIAGNOSIS — M545 Low back pain, unspecified: Secondary | ICD-10-CM

## 2013-04-02 ENCOUNTER — Ambulatory Visit: Payer: Medicaid Other

## 2013-04-10 ENCOUNTER — Other Ambulatory Visit: Payer: Self-pay | Admitting: Internal Medicine

## 2013-04-16 ENCOUNTER — Ambulatory Visit
Admission: RE | Admit: 2013-04-16 | Discharge: 2013-04-16 | Disposition: A | Discharge status: Home / Self Care | Payer: Medicaid Other | Source: Ambulatory Visit

## 2013-04-16 DIAGNOSIS — Z1231 Encounter for screening mammogram for malignant neoplasm of breast: Secondary | ICD-10-CM

## 2013-04-23 ENCOUNTER — Encounter: Payer: Self-pay | Admitting: Cardiovascular Disease

## 2013-04-29 ENCOUNTER — Telehealth: Payer: Self-pay | Admitting: Cardiovascular Disease

## 2013-04-29 NOTE — Telephone Encounter (Signed)
Please  Call-she needs another referral so she can get her C Pap supplies. [facility]  Phone number [phone] and ax number is [phone]

## 2013-04-29 NOTE — Telephone Encounter (Signed)
Message forwarded to W. Waddell, CMA.  

## 2013-05-02 ENCOUNTER — Other Ambulatory Visit: Payer: Self-pay | Admitting: Internal Medicine

## 2013-05-08 ENCOUNTER — Other Ambulatory Visit: Payer: Self-pay | Admitting: Internal Medicine

## 2013-05-08 ENCOUNTER — Other Ambulatory Visit: Payer: Self-pay | Admitting: Cardiovascular Disease

## 2013-05-08 NOTE — Telephone Encounter (Signed)
Informed patient that her CPAP care will be transferred to [facility]. Once they have confirmed her insurance information, they will be contacting her. patient voiced understanding. Records faxed to [facility] @ [phone].

## 2013-05-08 NOTE — Telephone Encounter (Signed)
Rx was sent to pharmacy electronically. 

## 2013-06-05 ENCOUNTER — Other Ambulatory Visit: Payer: Self-pay | Admitting: Internal Medicine

## 2013-06-06 ENCOUNTER — Other Ambulatory Visit: Payer: Self-pay | Admitting: Internal Medicine

## 2013-06-24 ENCOUNTER — Telehealth: Payer: Self-pay | Admitting: *Deleted

## 2013-06-24 NOTE — Telephone Encounter (Signed)
Faxed signed form ( [place] [facility]- CMN/PA)  For patient's CPAP supplies to [facility]

## 2013-06-27 ENCOUNTER — Other Ambulatory Visit: Payer: Self-pay | Admitting: Internal Medicine

## 2013-09-25 ENCOUNTER — Other Ambulatory Visit: Payer: Self-pay | Admitting: Internal Medicine

## 2014-02-13 ENCOUNTER — Other Ambulatory Visit: Payer: Self-pay | Admitting: Cardiovascular Disease

## 2014-02-15 NOTE — Telephone Encounter (Signed)
Rx was sent to pharmacy electronically. 

## 2014-03-16 ENCOUNTER — Other Ambulatory Visit: Payer: Self-pay | Admitting: Cardiovascular Disease

## 2014-03-17 ENCOUNTER — Other Ambulatory Visit: Payer: Self-pay

## 2014-03-17 ENCOUNTER — Telehealth: Payer: Self-pay | Admitting: Cardiovascular Disease

## 2014-03-17 DIAGNOSIS — Z1231 Encounter for screening mammogram for malignant neoplasm of breast: Secondary | ICD-10-CM

## 2014-03-17 NOTE — Telephone Encounter (Signed)
Rx refill sent to patient pharmacy   

## 2014-03-18 NOTE — Telephone Encounter (Signed)
Close encounter 

## 2014-03-26 ENCOUNTER — Ambulatory Visit (INDEPENDENT_AMBULATORY_CARE_PROVIDER_SITE_OTHER): Payer: Medicaid Other | Admitting: Physician Assistant

## 2014-03-26 ENCOUNTER — Encounter: Payer: Self-pay | Admitting: Physician Assistant

## 2014-03-26 VITALS — BP 110/70 | HR 60 | Ht 62.0 in | Wt 272.8 lb

## 2014-03-26 DIAGNOSIS — Z0000 Encounter for general adult medical examination without abnormal findings: Secondary | ICD-10-CM

## 2014-03-26 DIAGNOSIS — I10 Essential (primary) hypertension: Secondary | ICD-10-CM

## 2014-03-26 DIAGNOSIS — E1021 Type 1 diabetes mellitus with diabetic nephropathy: Secondary | ICD-10-CM

## 2014-03-26 DIAGNOSIS — I2510 Atherosclerotic heart disease of native coronary artery without angina pectoris: Secondary | ICD-10-CM

## 2014-03-26 DIAGNOSIS — N183 Chronic kidney disease, stage 3 unspecified: Secondary | ICD-10-CM

## 2014-03-26 NOTE — Progress Notes (Signed)
Patient ID: Ann [name], female   DOB: 1951/03/26, 63 y.o.   MRN: [id number]    Date:  03/26/2014   ID:  Cleve, Llaguno 09-04-1950, MRN [id number]  PCP:  Philis Fendt, MD  Primary Cardiologist:  Gwenlyn Found     History of Present Illness: Ann [name] is a 63 y.o. obese, single African American female mother of 3, grandmother to 6 grandchildren who saw Dr. Gwenlyn Found on 07/25/12.  I saw her last in October 2014.  She has a history of PCI of her right coronary artery at [facility] in the past. Her other problems include hypertension, hyperlipidemia, non-insulin-requiring diabetes and obstructive sleep apnea on CPAP. She had a Myoview stress test performed in November of 2010 which showed inferior ischemia, and a cath performed by Dr. Gwenlyn Found at [facility] on March 08, 2009 via the right radial approach revealed patent stents in the mid and distal RCA with high-grade disease in her proximal and mid AV groove circumflex as well as her obtuse marginal branch, as well as moderate disease in her proximal and mid LAD with normal LV function. Her circumflex vessels were tortuous. He elected to treat her medically. She had an uncomplicated total knee replacement performed by Dr. Rhona Raider 9/13. The last lipid profile performed in September of last year revealed a total cholesterol of 148, LDL of 86 and HDL of 35.  Dr. Jeanie Cooks recently checked her cholesterol. About 3 weeks ago she had SOB and LEE and was put on Lasix for short period. This has gotten much better and she is now breathing better.   She denies nausea, vomiting, fever, chest pain, shortness of breath, orthopnea, dizziness, PND, cough, congestion, abdominal pain, hematochezia, melena, lower extremity edema, claudication.  Wt Readings from Last 3 Encounters:  03/26/14 272 lb 12.8 oz (123.741 kg)  01/28/13 268 lb 11.2 oz (121.882 kg)  03/25/12 252 lb 3.2 oz (114.397 kg)     Past Medical History  Diagnosis Date  . Hyperlipidemia 10/21/2009  .  Shoulder pain, left 03/16/2009    occurs at night  . Lumbar back pain 02/16/2009    states pain goes down right leg  . Myalgia 02/16/2009  . Chest pain 02/16/2009  . Asthma 09/29/2008  . Anemia 09/29/2008    anemia of chronic disease  . Insulin dependent diabetes mellitus type IA 09/29/2008  . Hypertension 09/29/2008  . CAD (coronary artery disease) 09/29/2008    hx  [facility] - [facility]. Stress test 10/2011 nl with EF 58%. [place] card., last cath 2010  . Shortness of breath     with exerction  . Sleep apnea     CPAP  . Chronic kidney disease (CKD), stage III (moderate) 10/20/2009  . GERD (gastroesophageal reflux disease)   . Anginal pain     no recent chest pain    Current Outpatient Prescriptions  Medication Sig Dispense Refill  . ACCU-CHEK FASTCLIX LANCETS MISC USE AS DIRECTED TO CHECK BLOOD SUGAR FOUR TIMES A DAY(BEFORE MEALS AND AT BEDTIME). 102 each 9  . ACCU-CHEK SMARTVIEW test strip CHECK BLOOD SUGAR BEFORE MEALS AND AT BEDTIME 150 each 9  . amLODipine (NORVASC) 10 MG tablet TAKE 1 TABLET BY MOUTH DAILY 30 tablet PRN  . cetirizine (ZYRTEC) 10 MG chewable tablet Chew 1 tablet (10 mg total) by mouth daily. 30 tablet 3  . diclofenac sodium (VOLTAREN) 1 % GEL Apply topically as needed.    . fluticasone (FLONASE) 50 MCG/ACT nasal spray INHALE 1 SPRAY  IN EACH NOSTRIL EVERY DAY 16 g PRN  . insulin aspart (NOVOLOG FLEXPEN) 100 UNIT/ML injection Inject 3 units before each meal 1 pen 12  . Insulin Pen Needle (EASY TOUCH PEN NEEDLES) 31G X 8 MM MISC Use to inject insulin as instructed. 100 each 11  . isosorbide mononitrate (IMDUR) 30 MG 24 hr tablet Take 1 tablet (30 mg total) by mouth daily. *APPOINTMENT NEEDED** 30 tablet 0  . LANTUS SOLOSTAR 100 UNIT/ML injection INJECT 45 UNITS SUBCUTANEOUSLY EVERY NIGHT AT BEDTIME 15 mL 6  . lisinopril-hydrochlorothiazide (PRINZIDE,ZESTORETIC) 20-12.5 MG per tablet TAKE 2 TABLETS BY MOUTH EVERY DAY 60 tablet 1  . metoprolol (LOPRESSOR) 50 MG  tablet     . Multiple Vitamins-Iron (DAILY-VITAMIN/IRON) TABS Take one tablet daily 30 tablet 0  . pantoprazole (PROTONIX) 20 MG tablet TAKE 1 TABLET BY MOUTH DAILY 30 tablet 3  . pravastatin (PRAVACHOL) 40 MG tablet TAKE ONE TABLET BY MOUTH EVERY EVENING 30 tablet PRN  . PROAIR HFA 108 (90 BASE) MCG/ACT inhaler INHALE 1-2 PUFFS BY MOUTH EVERY 4 HOURS AS NEEDED FOR SHORTNESS OF BREATH 8.5 each PRN   No current facility-administered medications for this visit.    Allergies:    Allergies  Allergen Reactions  . Codeine     REACTION: nausea and vomiting  . Metronidazole     REACTION: itching    Social History:  The patient  reports that she has never smoked. Her smokeless tobacco use includes Snuff. She reports that she does not drink alcohol or use illicit drugs.   Family history:   Family History  Problem Relation Age of Onset  . Diabetes Mother   . Hypertension Mother   . Heart disease Mother   . Heart attack Father   . Heart disease Sister   . Heart disease Brother     ROS:  Please see the history of present illness.  All other systems reviewed and negative.   PHYSICAL EXAM: VS:  BP 110/70 mmHg  Pulse 60  Ht 5\' 2"  (1.575 m)  Wt 272 lb 12.8 oz (123.741 kg)  BMI 49.88 kg/m2 Morbidly obese, well developed, in no acute distress HEENT: Pupils are equal round react to light accommodation extraocular movements are intact.  Neck:No cervical lymphadenopathy. Cardiac: Regular rate and rhythm without murmurs rubs or gallops. Lungs:  clear to auscultation bilaterally, no wheezing, rhonchi or rales Ext: Trace lower extremity edema.  2+ radial and 1+ dorsalis pedis pulses. Skin: warm and dry Neuro:  Grossly normal    ASSESSMENT AND PLAN:  Problem List Items Addressed This Visit    CHRONIC KIDNEY DISEASE STAGE III (MODERATE)   Coronary atherosclerosis - Primary (Chronic)    No complaints of angina. She is on indoor 30 mg and metoprolol 50 mg twice a day    Health care  maintenance   HYPERTENSION    Blood pressure well controlled. Continue current therapy    Morbid obesity    I again offered nutrition counseling. 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

## 2014-03-26 NOTE — Assessment & Plan Note (Signed)
I again offered nutrition counseling. She does not appear to be interested

## 2014-03-26 NOTE — Assessment & Plan Note (Deleted)
I again offered her nutrition counseling.

## 2014-03-26 NOTE — Assessment & Plan Note (Addendum)
Followed by Dr. Jeanie Cooks.  On a statin

## 2014-03-26 NOTE — Assessment & Plan Note (Signed)
No complaints of angina. She is on indoor 30 mg and metoprolol 50 mg twice a day

## 2014-03-26 NOTE — Assessment & Plan Note (Signed)
Blood pressure well controlled.  Continue current therapy.

## 2014-03-26 NOTE — Patient Instructions (Signed)
Your physician recommends that you schedule a follow-up appointment in:6 months with Dr Berry 

## 2014-04-05 ENCOUNTER — Emergency Department (HOSPITAL_COMMUNITY)
Admission: EM | Admit: 2014-04-05 | Discharge: 2014-04-05 | Disposition: A | Discharge status: Home / Self Care | Payer: Medicaid Other | Attending: Emergency Medicine | Admitting: Emergency Medicine

## 2014-04-05 ENCOUNTER — Emergency Department (HOSPITAL_COMMUNITY): Payer: Medicaid Other

## 2014-04-05 ENCOUNTER — Encounter (HOSPITAL_COMMUNITY): Payer: Self-pay | Admitting: Family Medicine

## 2014-04-05 DIAGNOSIS — Z794 Long term (current) use of insulin: Secondary | ICD-10-CM | POA: Diagnosis not present

## 2014-04-05 DIAGNOSIS — R05 Cough: Secondary | ICD-10-CM

## 2014-04-05 DIAGNOSIS — I129 Hypertensive chronic kidney disease with stage 1 through stage 4 chronic kidney disease, or unspecified chronic kidney disease: Secondary | ICD-10-CM | POA: Diagnosis not present

## 2014-04-05 DIAGNOSIS — J209 Acute bronchitis, unspecified: Secondary | ICD-10-CM

## 2014-04-05 DIAGNOSIS — Z79899 Other long term (current) drug therapy: Secondary | ICD-10-CM | POA: Insufficient documentation

## 2014-04-05 DIAGNOSIS — J45901 Unspecified asthma with (acute) exacerbation: Secondary | ICD-10-CM | POA: Diagnosis not present

## 2014-04-05 DIAGNOSIS — R059 Cough, unspecified: Secondary | ICD-10-CM

## 2014-04-05 DIAGNOSIS — E785 Hyperlipidemia, unspecified: Secondary | ICD-10-CM | POA: Insufficient documentation

## 2014-04-05 DIAGNOSIS — E109 Type 1 diabetes mellitus without complications: Secondary | ICD-10-CM | POA: Diagnosis not present

## 2014-04-05 DIAGNOSIS — Z9981 Dependence on supplemental oxygen: Secondary | ICD-10-CM | POA: Diagnosis not present

## 2014-04-05 DIAGNOSIS — E669 Obesity, unspecified: Secondary | ICD-10-CM | POA: Insufficient documentation

## 2014-04-05 DIAGNOSIS — Z862 Personal history of diseases of the blood and blood-forming organs and certain disorders involving the immune mechanism: Secondary | ICD-10-CM | POA: Insufficient documentation

## 2014-04-05 DIAGNOSIS — Z9861 Coronary angioplasty status: Secondary | ICD-10-CM | POA: Insufficient documentation

## 2014-04-05 DIAGNOSIS — R0781 Pleurodynia: Secondary | ICD-10-CM | POA: Insufficient documentation

## 2014-04-05 DIAGNOSIS — N183 Chronic kidney disease, stage 3 (moderate): Secondary | ICD-10-CM | POA: Insufficient documentation

## 2014-04-05 DIAGNOSIS — Z9889 Other specified postprocedural states: Secondary | ICD-10-CM | POA: Insufficient documentation

## 2014-04-05 DIAGNOSIS — K219 Gastro-esophageal reflux disease without esophagitis: Secondary | ICD-10-CM | POA: Diagnosis not present

## 2014-04-05 DIAGNOSIS — I25119 Atherosclerotic heart disease of native coronary artery with unspecified angina pectoris: Secondary | ICD-10-CM | POA: Diagnosis not present

## 2014-04-05 DIAGNOSIS — G4730 Sleep apnea, unspecified: Secondary | ICD-10-CM | POA: Insufficient documentation

## 2014-04-05 MED ORDER — PREDNISONE 20 MG PO TABS
60.0000 mg | ORAL_TABLET | Freq: Once | ORAL | Status: AC
  Administered 2014-04-05: 60 mg via ORAL
  Filled 2014-04-05: qty 3

## 2014-04-05 MED ORDER — ALBUTEROL SULFATE (2.5 MG/3ML) 0.083% IN NEBU
5.0000 mg | INHALATION_SOLUTION | Freq: Once | RESPIRATORY_TRACT | Status: AC
  Administered 2014-04-05: 5 mg via RESPIRATORY_TRACT
  Filled 2014-04-05: qty 6

## 2014-04-05 MED ORDER — PREDNISONE 20 MG PO TABS: 40.0000 mg | ORAL_TABLET | Freq: Every day | ORAL | Status: DC

## 2014-04-05 MED ORDER — GUAIFENESIN ER 600 MG PO TB12: 600.0000 mg | ORAL_TABLET | Freq: Two times a day (BID) | ORAL | Status: DC

## 2014-04-05 NOTE — Discharge Instructions (Signed)
Follow-up with your primary care physician. Return to the ER if any severe shortness of breath, high fever, nausea, vomiting, chest pain.  Acute Bronchitis Bronchitis is inflammation of the airways that extend from the windpipe into the lungs (bronchi). The inflammation often causes mucus to develop. This leads to a cough, which is the most common symptom of bronchitis.  In acute bronchitis, the condition usually develops suddenly and goes away over time, usually in a couple weeks. Smoking, allergies, and asthma can make bronchitis worse. Repeated episodes of bronchitis may cause further lung problems.  CAUSES Acute bronchitis is most often caused by the same virus that causes a cold. The virus can spread from person to person (contagious) through coughing, sneezing, and touching contaminated objects. SIGNS AND SYMPTOMS   Cough.   Fever.   Coughing up mucus.   Body aches.   Chest congestion.   Chills.   Shortness of breath.   Sore throat.  DIAGNOSIS  Acute bronchitis is usually diagnosed through a physical exam. Your health care provider will also ask you questions about your medical history. Tests, such as chest X-rays, are sometimes done to rule out other conditions.  TREATMENT  Acute bronchitis usually goes away in a couple weeks. Oftentimes, no medical treatment is necessary. Medicines are sometimes given for relief of fever or cough. Antibiotic medicines are usually not needed but may be prescribed in certain situations. In some cases, an inhaler may be recommended to help reduce shortness of breath and control the cough. A cool mist vaporizer may also be used to help thin bronchial secretions and make it easier to clear the chest.  HOME CARE INSTRUCTIONS  Get plenty of rest.   Drink enough fluids to keep your urine clear or pale yellow (unless you have a medical condition that requires fluid restriction). Increasing fluids may help thin your respiratory secretions  (sputum) and reduce chest congestion, and it will prevent dehydration.   Take medicines only as directed by your health care provider.  If you were prescribed an antibiotic medicine, finish it all even if you start to feel better.  Avoid smoking and secondhand smoke. Exposure to cigarette smoke or irritating chemicals will make bronchitis worse. If you are a smoker, consider using nicotine gum or skin patches to help control withdrawal symptoms. Quitting smoking will help your lungs heal faster.   Reduce the chances of another bout of acute bronchitis by washing your hands frequently, avoiding people with cold symptoms, and trying not to touch your hands to your mouth, nose, or eyes.   Keep all follow-up visits as directed by your health care provider.  SEEK MEDICAL CARE IF: Your symptoms do not improve after 1 week of treatment.  SEEK IMMEDIATE MEDICAL CARE IF:  You develop an increased fever or chills.   You have chest pain.   You have severe shortness of breath.  You have bloody sputum.   You develop dehydration.  You faint or repeatedly feel like you are going to pass out.  You develop repeated vomiting.  You develop a severe headache. MAKE SURE YOU:   Understand these instructions.  Will watch your condition.  Will get help right away if you are not doing well or get worse. Document Released: 05/18/2004 Document Revised: 08/25/2013 Document Reviewed: 10/01/2012 [facility] Patient Information 2015 [place], [place]. This information is not intended to replace advice given to you by your health care provider. Make sure you discuss any questions you have with your health care provider.  Cough, Adult  A cough is a reflex that helps clear your throat and airways. It can help heal the body or may be a reaction to an irritated airway. A cough may only last 2 or 3 weeks (acute) or may last more than 8 weeks (chronic).  CAUSES Acute cough:  Viral or bacterial  infections. Chronic cough:  Infections.  Allergies.  Asthma.  Post-nasal drip.  Smoking.  Heartburn or acid reflux.  Some medicines.  Chronic lung problems (COPD).  Cancer. SYMPTOMS   Cough.  Fever.  Chest pain.  Increased breathing rate.  High-pitched whistling sound when breathing (wheezing).  Colored mucus that you cough up (sputum). TREATMENT   A bacterial cough may be treated with antibiotic medicine.  A viral cough must run its course and will not respond to antibiotics.  Your caregiver may recommend other treatments if you have a chronic cough. HOME CARE INSTRUCTIONS   Only take over-the-counter or prescription medicines for pain, discomfort, or fever as directed by your caregiver. Use cough suppressants only as directed by your caregiver.  Use a cold steam vaporizer or humidifier in your bedroom or home to help loosen secretions.  Sleep in a semi-upright position if your cough is worse at night.  Rest as needed.  Stop smoking if you smoke. SEEK IMMEDIATE MEDICAL CARE IF:   You have pus in your sputum.  Your cough starts to worsen.  You cannot control your cough with suppressants and are losing sleep.  You begin coughing up blood.  You have difficulty breathing.  You develop pain which is getting worse or is uncontrolled with medicine.  You have a fever. MAKE SURE YOU:   Understand these instructions.  Will watch your condition.  Will get help right away if you are not doing well or get worse. Document Released: 10/07/2010 Document Revised: 07/03/2011 Document Reviewed: 10/07/2010 [facility] Patient Information 2015 [place], [place]. This information is not intended to replace advice given to you by your health care provider. Make sure you discuss any questions you have with your health care provider.

## 2014-04-05 NOTE — ED Provider Notes (Signed)
CSN: [id number]     Arrival date & time 04/05/14  1140 History   First MD Initiated Contact with Patient 04/05/14 1248     Chief Complaint  Patient presents with  . Cough     (Consider location/radiation/quality/duration/timing/severity/associated sxs/prior Treatment) HPI Ann [name] is a 63 year old female with past medical history of asthma, hypertension, [facility] ED, CAD, hyperlipidemia, GERD who presents the ER with 5 days of nonproductive cough, nasal congestion, low-grade fever. Patient states her symptoms began gradually 5 days ago, and have since persisted. Patient states she's experienced similar symptoms in the past, with viral cold like symptoms. Patient states she's used her albuterol inhaler multiple times through the day with minimal relief. Patient reports a low-grade fever of 100.36F last night, and states she's been trying Robitussin and ibuprofen over-the-counter for relief of her symptoms. Patient reports associated "rib pain" on bilateral sides of her chest only when she coughs. Patient denies headache, dizziness, weakness, neck pain, back pain, chest pain, shortness of breath, abdominal pain, nausea, vomiting, dysuria, diarrhea.  Past Medical History  Diagnosis Date  . Hyperlipidemia 10/21/2009  . Shoulder pain, left 03/16/2009    occurs at night  . Lumbar back pain 02/16/2009    states pain goes down right leg  . Myalgia 02/16/2009  . Chest pain 02/16/2009  . Asthma 09/29/2008  . Anemia 09/29/2008    anemia of chronic disease  . Insulin dependent diabetes mellitus type IA 09/29/2008  . Hypertension 09/29/2008  . CAD (coronary artery disease) 09/29/2008    hx  PCI - [facility]. Stress test 10/2011 nl with EF 58%. [facility] card., last cath 2010  . Shortness of breath     with exerction  . Sleep apnea     CPAP  . Chronic kidney disease (CKD), stage III (moderate) 10/20/2009  . GERD (gastroesophageal reflux disease)   . Anginal pain     no recent chest pain    Past Surgical History  Procedure Laterality Date  . Appendectomy    . Cholecystectomy    . Total knee arthroplasty  12/2011    Left  . Abdominal hysterectomy    . Total knee arthroplasty  11/28/2011    Procedure: TOTAL KNEE ARTHROPLASTY;  Surgeon: Hessie Dibble, MD;  Location: [facility];  Service: Orthopedics;  Laterality: Right;  . Coronary angioplasty with stent placement  2005    2 vessel PCI at [facility].  . Cardiac catheterization  2008    in [place], [facility] stable  . Cardiac catheterization  2010    patent stents in mid and distal RCA; high grade disease in prox & mid AV groove LCX& OM; mod disease in prox & mid LAD normal LV FUNCTION--TREATED MEDICALLY    . Doppler echocardiography  1998    EF 60%   Family History  Problem Relation Age of Onset  . Diabetes Mother   . Hypertension Mother   . Heart disease Mother   . Heart attack Father   . Heart disease Sister   . Heart disease Brother    History  Substance Use Topics  . Smoking status: Never Smoker   . Smokeless tobacco: Current User    Types: Snuff  . Alcohol Use: No   OB History    No data available     Review of Systems  Constitutional: Negative for fever.  HENT: Positive for congestion and sore throat. Negative for trouble swallowing and voice change.   Eyes: Negative for visual disturbance.  Respiratory:  Positive for cough. Negative for shortness of breath.   Cardiovascular: Negative for chest pain.  Gastrointestinal: Negative for nausea, vomiting and abdominal pain.  Genitourinary: Negative for dysuria.  Musculoskeletal: Negative for neck pain.       Bilateral rib pain  Skin: Negative for rash.  Neurological: Negative for dizziness, weakness and numbness.  Psychiatric/Behavioral: Negative.       Allergies  Codeine and Metronidazole  Home Medications   Prior to Admission medications   Medication Sig Start Date End Date Taking? Authorizing Provider  ACCU-CHEK FASTCLIX LANCETS MISC USE AS DIRECTED  TO CHECK BLOOD SUGAR FOUR TIMES A DAY(BEFORE MEALS AND AT BEDTIME). 10/14/12   Sid Falcon, MD  ACCU-CHEK SMARTVIEW test strip CHECK BLOOD SUGAR BEFORE MEALS AND AT BEDTIME 10/14/12   Sid Falcon, MD  amLODipine (NORVASC) 10 MG tablet TAKE 1 TABLET BY MOUTH DAILY 02/01/12   Neta Ehlers, MD  cetirizine (ZYRTEC) 10 MG chewable tablet Chew 1 tablet (10 mg total) by mouth daily. 01/17/12   Ansel Bong, MD  diclofenac sodium (VOLTAREN) 1 % GEL Apply topically as needed.    Historical Provider, MD  fluticasone (FLONASE) 50 MCG/ACT nasal spray INHALE 1 SPRAY IN EACH NOSTRIL EVERY DAY 05/24/12   Neta Ehlers, MD  guaiFENesin (MUCINEX) 600 MG 12 hr tablet Take 1 tablet (600 mg total) by mouth 2 (two) times daily. 04/05/14   Carrie Mew, PA-C  insulin aspart (NOVOLOG FLEXPEN) 100 UNIT/ML injection Inject 3 units before each meal 01/17/12   Ansel Bong, MD  Insulin Pen Needle (EASY TOUCH PEN NEEDLES) 31G X 8 MM MISC Use to inject insulin as instructed. 02/02/12   Bartholomew Crews, MD  isosorbide mononitrate (IMDUR) 30 MG 24 hr tablet Take 1 tablet (30 mg total) by mouth daily. *APPOINTMENT NEEDED** 03/17/14   Lorretta Harp, MD  LANTUS SOLOSTAR 100 UNIT/ML injection INJECT 45 UNITS SUBCUTANEOUSLY EVERY NIGHT AT BEDTIME 07/31/12   Neta Ehlers, MD  lisinopril-hydrochlorothiazide (PRINZIDE,ZESTORETIC) 20-12.5 MG per tablet TAKE 2 TABLETS BY MOUTH EVERY DAY 02/05/12   Dominic Pea, DO  metoprolol (LOPRESSOR) 50 MG tablet  02/05/12   Dominic Pea, DO  Multiple Vitamins-Iron (DAILY-VITAMIN/IRON) TABS Take one tablet daily 01/17/12   Ansel Bong, MD  pantoprazole (PROTONIX) 20 MG tablet TAKE 1 TABLET BY MOUTH DAILY 02/05/12   Dominic Pea, DO  pravastatin (PRAVACHOL) 40 MG tablet TAKE ONE TABLET BY MOUTH EVERY EVENING 06/28/12   Neta Ehlers, MD  predniSONE (DELTASONE) 20 MG tablet Take 2 tablets (40 mg total) by mouth daily with breakfast. 04/05/14   Carrie Mew, PA-C  PROAIR HFA 108 (90 BASE)  MCG/ACT inhaler INHALE 1-2 PUFFS BY MOUTH EVERY 4 HOURS AS NEEDED FOR SHORTNESS OF BREATH 02/01/12   Neta Ehlers, MD   BP 123/51 mmHg  Pulse 71  Temp(Src) 98.3 F (36.8 C)  Resp 18  SpO2 97% Physical Exam  Constitutional: She is oriented to person, place, and time. She appears well-developed and well-nourished. No distress.  Obese female in no acute distress.  HENT:  Head: Normocephalic and atraumatic.  Mouth/Throat: Oropharynx is clear and moist. No oropharyngeal exudate.  Eyes: Right eye exhibits no discharge. Left eye exhibits no discharge. No scleral icterus.  Neck: Normal range of motion.  Cardiovascular: Normal rate, regular rhythm and normal heart sounds.   No murmur heard. Pulmonary/Chest: Effort normal. No accessory muscle usage. No tachypnea. No respiratory distress. She has wheezes in the right  upper field and the left upper field.    Mild wheezes noted in bilateral upper lobes. No respiratory distress. The patient speaking in full, clear sentences. Mild tenderness noted to lower ribs bilaterally in patient's axillary regions  Abdominal: Soft. There is no tenderness.  Musculoskeletal: Normal range of motion. She exhibits no edema or tenderness.  Neurological: She is alert and oriented to person, place, and time. No cranial nerve deficit. Coordination normal.  Skin: Skin is warm and dry. No rash noted. She is not diaphoretic.  Psychiatric: She has a normal mood and affect.  Nursing note and vitals reviewed.   ED Course  Procedures (including critical care time) Labs Review Labs Reviewed - No data to display  Imaging Review Dg Chest 2 View  04/05/2014   CLINICAL DATA:  Three day history of cough, headache, fever, sore throat and body aches. Current history of diabetes and hypertension.  EXAM: CHEST  2 VIEW  COMPARISON:  02/26/2011, 09/27/2008.  FINDINGS: Cardiomediastinal silhouette unremarkable, unchanged. Mildly prominent bronchovascular markings diffusely and mild  to moderate central peribronchial thickening, more so than on the prior examinations. Lungs otherwise clear. No localized airspace consolidation. No pleural effusions. No pneumothorax. Normal pulmonary vascularity. Visualized bony thorax intact.  IMPRESSION: Mild to moderate changes of acute bronchitis and/or asthma without focal airspace pneumonia.   Electronically Signed   By: Evangeline Dakin M.D.   On: 04/05/2014 13:06     EKG Interpretation   Date/Time:  Sunday April 05 2014 13:01:01 EST Ventricular Rate:  70 PR Interval:  161 QRS Duration: 91 QT Interval:  408 QTC Calculation: 440 R Axis:   47 Text Interpretation:  Sinus rhythm Sinus rhythm Non-specific  intra-ventricular conduction delay Borderline ECG Confirmed by Carmin Muskrat  MD [phone]) on 04/05/2014 1:10:10 PM      MDM   Final diagnoses:  Acute bronchitis, unspecified organism    Patient here with cough, nasal congestion, low-grade fever the past 5 days. Symptoms consistent with viral etiology. We'll follow up with chest x-ray, breathing treatment and prednisone.  Chest x-ray remarkable for impression: Mild to moderate changes of acute bronchitis and/or asthma without focal airspace pneumonia.  Lungs clear on reassessment. Patient non-tachypneic, nontoxic tachycardic, non-hypoxic, and in no acute distress. We will discharge patient with prescription for prednisone, expectorant, and have her follow-up with her primary care physician. I discussed return precautions with patient, and patient was agreeable to this plan. I encouraged her to call or return to the ER should she have any questions or concerns.  BP 123/51 mmHg  Pulse 71  Temp(Src) 98.3 F (36.8 C)  Resp 18  SpO2 97%  Signed,  Dahlia Bailiff, PA-C 4:27 PM  Patient discussed with Dr. Carmin Muskrat, M.D.  Carrie Mew, PA-C 04/05/14 1627  Carmin Muskrat, MD 04/08/14 [phone]

## 2014-04-05 NOTE — ED Notes (Signed)
Per pt sts cough and rib pain from coughing since Friday. sts low grade fever.

## 2014-04-13 ENCOUNTER — Other Ambulatory Visit: Payer: Self-pay | Admitting: Cardiovascular Disease

## 2014-04-14 NOTE — Telephone Encounter (Signed)
Rx refill sent to patient pharmacy   

## 2014-05-06 ENCOUNTER — Other Ambulatory Visit: Payer: Self-pay | Admitting: Internal Medicine

## 2014-05-06 DIAGNOSIS — N644 Mastodynia: Secondary | ICD-10-CM

## 2014-05-27 ENCOUNTER — Ambulatory Visit
Admission: RE | Admit: 2014-05-27 | Discharge: 2014-05-27 | Disposition: A | Discharge status: Home / Self Care | Payer: Medicare Other | Source: Ambulatory Visit | Attending: Internal Medicine | Admitting: Internal Medicine

## 2014-05-27 DIAGNOSIS — N644 Mastodynia: Secondary | ICD-10-CM

## 2014-10-28 ENCOUNTER — Encounter: Payer: Self-pay | Admitting: Internal Medicine

## 2014-11-03 ENCOUNTER — Other Ambulatory Visit: Payer: Self-pay | Admitting: Cardiovascular Disease

## 2014-11-04 NOTE — Telephone Encounter (Signed)
Rx(s) sent to pharmacy electronically.  

## 2014-11-19 ENCOUNTER — Encounter: Payer: Self-pay | Admitting: Gastroenterology

## 2014-11-27 ENCOUNTER — Other Ambulatory Visit: Payer: Self-pay | Admitting: Cardiovascular Disease

## 2014-11-27 NOTE — Telephone Encounter (Signed)
Rx(s) sent to pharmacy electronically.  

## 2015-01-05 ENCOUNTER — Ambulatory Visit (AMBULATORY_SURGERY_CENTER): Payer: Self-pay | Admitting: *Deleted

## 2015-01-05 VITALS — Ht 62.0 in | Wt 253.4 lb

## 2015-01-05 DIAGNOSIS — Z86010 Personal history of colonic polyps: Secondary | ICD-10-CM

## 2015-01-05 MED ORDER — NA SULFATE-K SULFATE-MG SULF 17.5-3.13-1.6 GM/177ML PO SOLN
1.0000 | Freq: Once | ORAL | Status: DC
Start: 2015-01-05 — End: 2015-01-19

## 2015-01-05 NOTE — Progress Notes (Signed)
Denies allergies to eggs or soy products. Denies complications with sedation or anesthesia. Denies O2 use. Denies use of diet or weight loss medications.  Emmi instructions given for colonoscopy.  

## 2015-01-19 ENCOUNTER — Encounter: Payer: Self-pay | Admitting: Gastroenterology

## 2015-01-19 ENCOUNTER — Ambulatory Visit (AMBULATORY_SURGERY_CENTER): Payer: Medicare Other | Admitting: Gastroenterology

## 2015-01-19 VITALS — BP 111/51 | HR 60 | Temp 96.6°F | Resp 13 | Ht 62.0 in | Wt 253.0 lb

## 2015-01-19 DIAGNOSIS — Z86010 Personal history of colonic polyps: Secondary | ICD-10-CM | POA: Diagnosis not present

## 2015-01-19 DIAGNOSIS — D123 Benign neoplasm of transverse colon: Secondary | ICD-10-CM

## 2015-01-19 DIAGNOSIS — D122 Benign neoplasm of ascending colon: Secondary | ICD-10-CM | POA: Diagnosis not present

## 2015-01-19 LAB — GLUCOSE, CAPILLARY
Glucose-Capillary: 180 mg/dL — ABNORMAL HIGH (ref 65–99)
Glucose-Capillary: 185 mg/dL — ABNORMAL HIGH (ref 65–99)

## 2015-01-19 MED ORDER — SODIUM CHLORIDE 0.9 % IV SOLN: 500.0000 mL | INTRAVENOUS | Status: DC

## 2015-01-19 NOTE — Progress Notes (Signed)
Called to room to assist during endoscopic procedure.  Patient ID and intended procedure confirmed with present staff. Received instructions for my participation in the procedure from the performing physician.  

## 2015-01-19 NOTE — Patient Instructions (Signed)
YOU HAD AN ENDOSCOPIC PROCEDURE TODAY AT [facility]:   Refer to the procedure report that was given to you for any specific questions about what was found during the examination.  If the procedure report does not answer your questions, please call your gastroenterologist to clarify.  If you requested that your care partner not be given the details of your procedure findings, then the procedure report has been included in a sealed envelope for you to review at your convenience later.  YOU SHOULD EXPECT: Some feelings of bloating in the abdomen. Passage of more gas than usual.  Walking can help get rid of the air that was put into your GI tract during the procedure and reduce the bloating. If you had a lower endoscopy (such as a colonoscopy or flexible sigmoidoscopy) you may notice spotting of blood in your stool or on the toilet paper. If you underwent a bowel prep for your procedure, you may not have a normal bowel movement for a few days.  Please Note:  You might notice some irritation and congestion in your nose or some drainage.  This is from the oxygen used during your procedure.  There is no need for concern and it should clear up in a day or so.  SYMPTOMS TO REPORT IMMEDIATELY:   Following lower endoscopy (colonoscopy or flexible sigmoidoscopy):  Excessive amounts of blood in the stool  Significant tenderness or worsening of abdominal pains  Swelling of the abdomen that is new, acute  Fever of 100F or higher    For urgent or emergent issues, a gastroenterologist can be reached at any hour by calling [phone].   DIET: Your first meal following the procedure should be a small meal and then it is ok to progress to your normal diet. Heavy or fried foods are harder to digest and may make you feel nauseous or bloated.  Likewise, meals heavy in dairy and vegetables can increase bloating.  Drink plenty of fluids but you should avoid alcoholic beverages for 24  hours.  ACTIVITY:  You should plan to take it easy for the rest of today and you should NOT DRIVE or use heavy machinery until tomorrow (because of the sedation medicines used during the test).    FOLLOW UP: Our staff will call the number listed on your records the next business day following your procedure to check on you and address any questions or concerns that you may have regarding the information given to you following your procedure. If we do not reach you, we will leave a message.  However, if you are feeling well and you are not experiencing any problems, there is no need to return our call.  We will assume that you have returned to your regular daily activities without incident.  If any biopsies were taken you will be contacted by phone or by letter within the next 1-3 weeks.  Please call us at [phone] if you have not heard about the biopsies in 3 weeks.    SIGNATURES/CONFIDENTIALITY: You and/or your care partner have signed paperwork which will be entered into your electronic medical record.  These signatures attest to the fact that that the information above on your After Visit Summary has been reviewed and is understood.  Full responsibility of the confidentiality of this discharge information lies with you and/or your care-partner.    Handouts were given to your care partner on polyps, diverticulosis, hemorrhoids,  and a high fiber diet with liberal fluid intake.  Your blood sugar was 185 in the recovery room. Hold NSAIDS for 2 weeks post polyp removal. You may resume your other current medications today. Await biopsy results. Please call if any questions or concerns.

## 2015-01-19 NOTE — Op Note (Signed)
[place]  [address] [place], [id number]   COLONOSCOPY PROCEDURE REPORT  PATIENT: Ann [name], Ann [name]  MR#: [id number] BIRTHDATE: [birth year] , 72  yrs. old GENDER: female ENDOSCOPIST: [place] Cellar, MD REFERRED BY: [name], MD PROCEDURE DATE:  01/19/2015 PROCEDURE:   Colonoscopy with snare polypectomy and Colonoscopy with biopsy First Screening Colonoscopy - Avg.  risk and is 50 yrs.  old or older - No.  Prior Negative Screening - Now for repeat screening. N/A  History of Adenoma - Now for follow-up colonoscopy & has been > or = to 3 yrs.  Yes hx of adenoma.  Has been 3 or more years since last colonoscopy.  Polyps removed today? Yes ASA CLASS:   Class II INDICATIONS:Surveillance due to prior colonic neoplasia and Colorectal Neoplasm Risk Assessment for this procedure is average risk. MEDICATIONS: Propofol 300 mg IV  DESCRIPTION OF PROCEDURE:   After the risks benefits and alternatives of the procedure were thoroughly explained, informed consent was obtained.  The digital rectal exam revealed external hemorrhoids.   The LB [id number]  endoscope was introduced through the anus and advanced to the cecum, which was identified by both the appendix and ileocecal valve. No adverse events experienced.   The quality of the prep was good.  The instrument was then slowly withdrawn as the colon was fully examined. Estimated blood loss is zero unless otherwise noted in this procedure report.   COLON FINDINGS: A sessile polyp measuring 5 mm in size was found in the ascending colon.  A polypectomy was performed with a cold snare.  The resection was complete, the polyp tissue was completely retrieved and sent to histology.   A pedunculated polyp measuring 6 mm in size was found in the hepatic flexure.  A polypectomy was performed using snare cautery.  The resection was complete, the polyp tissue was completely retrieved and sent to histology.   A sessile  polyp measuring 3 mm in size was found in the transverse colon.  A polypectomy was performed with cold forceps.  The resection was complete, the polyp tissue was completely retrieved and sent to histology.   There was moderate diverticulosis noted in the sigmoid colon and ascending colon.   The examination was otherwise normal.  Retroflexed views revealed internal hemorrhoids. The time to cecum = 2.4 Withdrawal time = 21.2   The scope was withdrawn and the procedure completed.  COMPLICATIONS: There were no immediate complications.   ENDOSCOPIC IMPRESSION: 1.   Sessile polyp was found in the ascending colon; polypectomy was performed with a cold snare 2.   Pedunculated polyp was found in the hepatic flexure; polypectomy was performed using snare cautery 3.   Sessile polyp was found in the transverse colon; polypectomy was performed with cold forceps 4.   Moderate diverticulosis was noted in the sigmoid colon and ascending colon 5.   The examination was otherwise normal 6.   Internal and external hemorrhoids  RECOMMENDATIONS: 1.  Hold NSAIDS for 2 weeks post polypectomy 2.  Await pathology results 3.  Resume diet 4.  Resume medications  eSigned:  [place] Cellar, MD 01/19/2015 10:15 AM   cc: [name] MD, the patient   PATIENT NAME:  Ann [name], Ann [name] MR#: [id number]

## 2015-01-19 NOTE — Progress Notes (Signed)
To recovery, report to Willis, RN, VSS 

## 2015-01-20 ENCOUNTER — Telehealth: Payer: Self-pay | Admitting: *Deleted

## 2015-01-20 NOTE — Telephone Encounter (Signed)
  Follow up Call-  Call back number 01/19/2015  Post procedure Call Back phone  # [phone]  Permission to leave phone message Yes     Patient questions:  Do you have a fever, pain , or abdominal swelling? No. Pain Score  0 *  Have you tolerated food without any problems? Yes.    Have you been able to return to your normal activities? Yes.    Do you have any questions about your discharge instructions: Diet   No. Medications  No. Follow up visit  No.  Do you have questions or concerns about your Care? No.  Actions: * If pain score is 4 or above: No action needed, pain <4.

## 2015-01-26 ENCOUNTER — Encounter: Payer: Self-pay | Admitting: Gastroenterology

## 2015-06-08 ENCOUNTER — Other Ambulatory Visit: Payer: Self-pay

## 2015-06-08 DIAGNOSIS — Z1231 Encounter for screening mammogram for malignant neoplasm of breast: Secondary | ICD-10-CM

## 2015-06-15 ENCOUNTER — Encounter (INDEPENDENT_AMBULATORY_CARE_PROVIDER_SITE_OTHER): Payer: Self-pay | Admitting: Ophthalmology

## 2015-06-22 ENCOUNTER — Ambulatory Visit: Payer: Medicare Other

## 2015-06-28 ENCOUNTER — Encounter (INDEPENDENT_AMBULATORY_CARE_PROVIDER_SITE_OTHER): Payer: Medicare HMO | Admitting: Ophthalmology

## 2015-06-28 DIAGNOSIS — I10 Essential (primary) hypertension: Secondary | ICD-10-CM | POA: Diagnosis not present

## 2015-06-28 DIAGNOSIS — E113513 Type 2 diabetes mellitus with proliferative diabetic retinopathy with macular edema, bilateral: Secondary | ICD-10-CM | POA: Diagnosis not present

## 2015-06-28 DIAGNOSIS — E11311 Type 2 diabetes mellitus with unspecified diabetic retinopathy with macular edema: Secondary | ICD-10-CM | POA: Diagnosis not present

## 2015-06-28 DIAGNOSIS — H43813 Vitreous degeneration, bilateral: Secondary | ICD-10-CM

## 2015-06-28 DIAGNOSIS — H35033 Hypertensive retinopathy, bilateral: Secondary | ICD-10-CM | POA: Diagnosis not present

## 2015-07-01 ENCOUNTER — Ambulatory Visit
Admission: RE | Admit: 2015-07-01 | Discharge: 2015-07-01 | Disposition: A | Discharge status: Home / Self Care | Payer: Medicare HMO | Source: Ambulatory Visit

## 2015-07-01 DIAGNOSIS — Z1231 Encounter for screening mammogram for malignant neoplasm of breast: Secondary | ICD-10-CM

## 2015-07-08 ENCOUNTER — Other Ambulatory Visit (INDEPENDENT_AMBULATORY_CARE_PROVIDER_SITE_OTHER): Payer: Medicare HMO | Admitting: Ophthalmology

## 2015-07-08 DIAGNOSIS — E11311 Type 2 diabetes mellitus with unspecified diabetic retinopathy with macular edema: Secondary | ICD-10-CM

## 2015-07-08 DIAGNOSIS — E113512 Type 2 diabetes mellitus with proliferative diabetic retinopathy with macular edema, left eye: Secondary | ICD-10-CM

## 2015-11-08 ENCOUNTER — Ambulatory Visit (INDEPENDENT_AMBULATORY_CARE_PROVIDER_SITE_OTHER): Payer: Medicare HMO | Admitting: Ophthalmology

## 2015-12-01 ENCOUNTER — Ambulatory Visit (INDEPENDENT_AMBULATORY_CARE_PROVIDER_SITE_OTHER): Payer: Medicare HMO | Admitting: Ophthalmology

## 2016-06-27 ENCOUNTER — Other Ambulatory Visit: Payer: Self-pay | Admitting: Internal Medicine

## 2016-06-27 DIAGNOSIS — Z1231 Encounter for screening mammogram for malignant neoplasm of breast: Secondary | ICD-10-CM

## 2016-07-25 ENCOUNTER — Ambulatory Visit: Payer: Medicare HMO

## 2016-08-06 ENCOUNTER — Encounter (HOSPITAL_COMMUNITY): Payer: Self-pay | Admitting: *Deleted

## 2016-08-06 ENCOUNTER — Emergency Department (HOSPITAL_COMMUNITY)
Admission: EM | Admit: 2016-08-06 | Discharge: 2016-08-06 | Disposition: A | Discharge status: Home / Self Care | Payer: Medicare Other | Attending: Emergency Medicine | Admitting: Emergency Medicine

## 2016-08-06 DIAGNOSIS — E10649 Type 1 diabetes mellitus with hypoglycemia without coma: Secondary | ICD-10-CM | POA: Insufficient documentation

## 2016-08-06 DIAGNOSIS — J45909 Unspecified asthma, uncomplicated: Secondary | ICD-10-CM | POA: Insufficient documentation

## 2016-08-06 DIAGNOSIS — E1022 Type 1 diabetes mellitus with diabetic chronic kidney disease: Secondary | ICD-10-CM | POA: Diagnosis not present

## 2016-08-06 DIAGNOSIS — Z96653 Presence of artificial knee joint, bilateral: Secondary | ICD-10-CM | POA: Insufficient documentation

## 2016-08-06 DIAGNOSIS — I2510 Atherosclerotic heart disease of native coronary artery without angina pectoris: Secondary | ICD-10-CM | POA: Diagnosis not present

## 2016-08-06 DIAGNOSIS — N183 Chronic kidney disease, stage 3 (moderate): Secondary | ICD-10-CM | POA: Insufficient documentation

## 2016-08-06 DIAGNOSIS — E162 Hypoglycemia, unspecified: Secondary | ICD-10-CM

## 2016-08-06 LAB — URINALYSIS, ROUTINE W REFLEX MICROSCOPIC
Bilirubin Urine: NEGATIVE
Glucose, UA: NEGATIVE mg/dL
Hgb urine dipstick: NEGATIVE
Ketones, ur: NEGATIVE mg/dL
Leukocytes, UA: NEGATIVE
Nitrite: NEGATIVE
Protein, ur: 100 mg/dL — AB
Specific Gravity, Urine: 1.012 (ref 1.005–1.030)
pH: 6 (ref 5.0–8.0)

## 2016-08-06 LAB — CBC WITH DIFFERENTIAL/PLATELET
Basophils Absolute: 0 10*3/uL (ref 0.0–0.1)
Basophils Relative: 0 %
Eosinophils Absolute: 0.1 10*3/uL (ref 0.0–0.7)
Eosinophils Relative: 1 %
HCT: 35 % — ABNORMAL LOW (ref 36.0–46.0)
Hemoglobin: 11.4 g/dL — ABNORMAL LOW (ref 12.0–15.0)
Lymphocytes Relative: 29 %
Lymphs Abs: 1.9 10*3/uL (ref 0.7–4.0)
MCH: 28.5 pg (ref 26.0–34.0)
MCHC: 32.6 g/dL (ref 30.0–36.0)
MCV: 87.5 fL (ref 78.0–100.0)
Monocytes Absolute: 0.3 10*3/uL (ref 0.1–1.0)
Monocytes Relative: 5 %
Neutro Abs: 4.2 10*3/uL (ref 1.7–7.7)
Neutrophils Relative %: 65 %
Platelets: 217 10*3/uL (ref 150–400)
RBC: 4 MIL/uL (ref 3.87–5.11)
RDW: 14.6 % (ref 11.5–15.5)
WBC: 6.5 10*3/uL (ref 4.0–10.5)

## 2016-08-06 LAB — CBG MONITORING, ED
Glucose-Capillary: 105 mg/dL — ABNORMAL HIGH (ref 65–99)
Glucose-Capillary: 168 mg/dL — ABNORMAL HIGH (ref 65–99)
Glucose-Capillary: 177 mg/dL — ABNORMAL HIGH (ref 65–99)

## 2016-08-06 LAB — BASIC METABOLIC PANEL
Anion gap: 8 (ref 5–15)
BUN: 24 mg/dL — ABNORMAL HIGH (ref 6–20)
CO2: 21 mmol/L — ABNORMAL LOW (ref 22–32)
Calcium: 8.9 mg/dL (ref 8.9–10.3)
Chloride: 111 mmol/L (ref 101–111)
Creatinine, Ser: 2.26 mg/dL — ABNORMAL HIGH (ref 0.44–1.00)
GFR calc Af Amer: 25 mL/min — ABNORMAL LOW (ref >60)
GFR calc non Af Amer: 21 mL/min — ABNORMAL LOW (ref >60)
Glucose, Bld: 113 mg/dL — ABNORMAL HIGH (ref 65–99)
Potassium: 5.1 mmol/L (ref 3.5–5.1)
Sodium: 140 mmol/L (ref 135–145)

## 2016-08-06 NOTE — ED Provider Notes (Signed)
[facility] DEPT Provider Note   CSN: [id number] Arrival date & time: 08/06/16  1140     History   Chief Complaint Chief Complaint  Patient presents with  . Hypoglycemia    HPI Ann [name] is a 66 y.o. female.  Patient is a 66 year old female with a history of diabetes who presents with low blood sugar. She states that she took her regular medications this morning and did not eat breakfast. She was feeling a little bit shaky when she left for church but by the time she got to church she was feeling weaker and lightheaded with some diaphoresis. Her blood sugar was found to be 58. She got a hotdog, some orange juice and oral glucose at the church and is currently feeling better. She denies any chest pain or shortness of breath. No cough or other recent illnesses. No fevers. No abdominal pain. No urinary symptoms.      Past Medical History:  Diagnosis Date  . Anemia 09/29/2008   anemia of chronic disease  . Anginal pain ([place])    no recent chest pain  . Asthma 09/29/2008  . CAD (coronary artery disease) 09/29/2008   hx  PCI - [facility]. Stress test 10/2011 nl with EF 58%. [place] card., last cath 2010  . Chest pain 02/16/2009  . Chronic kidney disease (CKD), stage III (moderate) 10/20/2009  . GERD (gastroesophageal reflux disease)   . Hyperlipidemia 10/21/2009  . Hypertension 09/29/2008  . Insulin dependent diabetes mellitus type IA ([place]) 09/29/2008  . Lumbar back pain 02/16/2009   states pain goes down right leg  . Myalgia 02/16/2009  . Shortness of breath    with exerction  . Shoulder pain, left 03/16/2009   occurs at night  . Sleep apnea    CPAP    Patient Active Problem List   Diagnosis Date Noted  . Morbid obesity ([place]) 01/28/2013  . 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 Health care maintenance 03/25/2012  . Acute blood loss anemia 12/01/2011    Class: Acute  . 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 Plantar fasciitis of right foot 09/28/2011  . Breast pain 03/08/2011  . 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 Knee pain, right 01/20/2011  . Thoracic spine pain 12/13/2010  . DYSLIPIDEMIA 10/21/2009  . CHRONIC KIDNEY DISEASE STAGE III (MODERATE) 10/20/2009  . UNSPECIFIED ANEMIA 09/29/2008  . HYPERTENSION 09/29/2008  . Coronary atherosclerosis 09/29/2008  . ASTHMA, UNSPECIFIED, UNSPECIFIED STATUS 09/29/2008    Past Surgical History:  Procedure Laterality Date  . ABDOMINAL HYSTERECTOMY    . APPENDECTOMY    . CARDIAC CATHETERIZATION  2008   in [place], [place] stable  . CARDIAC CATHETERIZATION  2010   patent stents in mid and distal RCA; high grade disease in prox & mid AV groove LCX& OM; mod disease in prox & mid LAD normal LV FUNCTION--TREATED MEDICALLY    . CATARACT EXTRACTION, BILATERAL    . CHOLECYSTECTOMY    . CORONARY ANGIOPLASTY WITH STENT PLACEMENT  2005   2 vessel PCI at [facility].  [place]   EF 60%  . TOTAL KNEE ARTHROPLASTY  12/2011   Left  . TOTAL KNEE ARTHROPLASTY  11/28/2011   Procedure: TOTAL KNEE ARTHROPLASTY;  Surgeon: Hessie Dibble, MD;  Location: [facility];  Service: Orthopedics;  Laterality: Right;    OB History    No data available  Home Medications    Prior to Admission medications   Medication Sig Start Date End Date Taking? Authorizing Provider  amLODipine (NORVASC) 10 MG tablet TAKE 1 TABLET BY MOUTH DAILY 02/01/12  Yes Neta Ehlers, MD  cetirizine (ZYRTEC) 10 MG chewable tablet Chew 1 tablet (10 mg total) by mouth daily. Patient taking differently: Chew 10 mg by mouth daily as needed for allergies.  01/17/12  Yes Ansel Bong, MD  fluticasone (FLONASE) 50 MCG/ACT nasal spray INHALE 1 SPRAY IN EACH NOSTRIL EVERY DAY Patient taking differently: INHALE 2 SPRAY IN EACH NOSTRIL EVERY DAY AS NEEDED FOR CONGESTION 05/24/12  Yes Neta Ehlers, MD  insulin aspart (NOVOLOG FLEXPEN) 100 UNIT/ML injection Inject 3 units  before each meal Patient taking differently: Inject 5 Units into the skin 3 (three) times daily with meals.  01/17/12  Yes Ansel Bong, MD  insulin detemir (LEVEMIR) 100 UNIT/ML injection Inject 70 Units into the skin at bedtime.   Yes Historical Provider, MD  linagliptin (TRADJENTA) 5 MG TABS tablet Take 5 mg by mouth daily.   Yes Historical Provider, MD  lisinopril-hydrochlorothiazide (PRINZIDE,ZESTORETIC) 20-12.5 MG per tablet TAKE 2 TABLETS BY MOUTH EVERY DAY 02/05/12  Yes Dominic Pea, DO  metoprolol (LOPRESSOR) 50 MG tablet Take 50 mg by mouth 2 (two) times daily.  02/05/12  Yes Alejandro Paya, DO  pantoprazole (PROTONIX) 20 MG tablet TAKE 1 TABLET BY MOUTH DAILY 02/05/12  Yes Alejandro Paya, DO  pravastatin (PRAVACHOL) 40 MG tablet TAKE ONE TABLET BY MOUTH EVERY EVENING 06/28/12  Yes Neta Ehlers, MD  PROAIR HFA 108 (90 BASE) MCG/ACT inhaler INHALE 1-2 PUFFS BY MOUTH EVERY 4 HOURS AS NEEDED FOR SHORTNESS OF BREATH 02/01/12  Yes Neta Ehlers, MD  ACCU-CHEK FASTCLIX LANCETS MISC USE AS DIRECTED TO CHECK BLOOD SUGAR FOUR TIMES A DAY(BEFORE MEALS AND AT BEDTIME). Patient not taking: Reported on 08/06/2016 10/14/12   Sid Falcon, MD  ACCU-CHEK SMARTVIEW test strip CHECK BLOOD SUGAR BEFORE MEALS AND AT BEDTIME Patient not taking: Reported on 08/06/2016 10/14/12   Sid Falcon, MD  guaiFENesin (MUCINEX) 600 MG 12 hr tablet Take 1 tablet (600 mg total) by mouth 2 (two) times daily. Patient not taking: Reported on 01/05/2015 04/05/14   Dahlia Bailiff, PA-C  Insulin Pen Needle (EASY TOUCH PEN NEEDLES) 31G X 8 MM MISC Use to inject insulin as instructed. Patient not taking: Reported on 08/06/2016 02/02/12   Bartholomew Crews, MD  isosorbide mononitrate (IMDUR) 30 MG 24 hr tablet TAKE 1 TABLET BY MOUTH ONCE DAILY Patient not taking: Reported on 08/06/2016 11/04/14   Lorretta Harp, MD  Multiple Vitamins-Iron (DAILY-VITAMIN/IRON) TABS Take one tablet daily Patient not taking: Reported on 08/06/2016  01/17/12   Ansel Bong, MD    Family History Family History  Problem Relation Age of Onset  . Heart attack Father   . Heart disease Sister   . Heart disease Brother   . Diabetes Mother   . Hypertension Mother   . Heart disease Mother   . Colon cancer Neg Hx     Social History Social History  Substance Use Topics  . Smoking status: Never Smoker  . Smokeless tobacco: Current User    Types: Snuff     Comment: using "all my life"  . Alcohol use No     Allergies   Codeine and Metronidazole   Review of Systems Review of Systems  Constitutional: Positive for diaphoresis and fatigue. Negative for chills and  fever.  HENT: Negative for congestion, rhinorrhea and sneezing.   Eyes: Negative.   Respiratory: Negative for cough, chest tightness and shortness of breath.   Cardiovascular: Negative for chest pain and leg swelling.  Gastrointestinal: Negative for abdominal pain, blood in stool, diarrhea, nausea and vomiting.  Genitourinary: Negative for difficulty urinating, flank pain, frequency and hematuria.  Musculoskeletal: Negative for arthralgias and back pain.  Skin: Negative for rash.  Neurological: Positive for light-headedness. Negative for dizziness, speech difficulty, weakness, numbness and headaches.     Physical Exam Updated Vital Signs BP (!) 150/93 (BP Location: Left Arm)   Pulse 65   Temp 97.3 F (36.3 C) (Oral)   Resp 15   SpO2 100%   Physical Exam  Constitutional: She is oriented to person, place, and time. She appears well-developed and well-nourished.  HENT:  Head: Normocephalic and atraumatic.  Eyes: Pupils are equal, round, and reactive to light.  Neck: Normal range of motion. Neck supple.  Cardiovascular: Normal rate, regular rhythm and normal heart sounds.   Pulmonary/Chest: Effort normal and breath sounds normal. No respiratory distress. She has no wheezes. She has no rales. She exhibits no tenderness.  Abdominal: Soft. Bowel sounds are normal.  There is no tenderness. There is no rebound and no guarding.  Musculoskeletal: Normal range of motion. She exhibits no edema.  Lymphadenopathy:    She has no cervical adenopathy.  Neurological: She is alert and oriented to person, place, and time.  Skin: Skin is warm and dry. No rash noted.  Psychiatric: She has a normal mood and affect.     ED Treatments / Results  Labs (all labs ordered are listed, but only abnormal results are displayed) Labs Reviewed  BASIC METABOLIC PANEL - Abnormal; Notable for the following:       Result Value   CO2 21 (*)    Glucose, Bld 113 (*)    BUN 24 (*)    Creatinine, Ser 2.26 (*)    GFR calc non Af Amer 21 (*)    GFR calc Af Amer 25 (*)    All other components within normal limits  CBC WITH DIFFERENTIAL/PLATELET - Abnormal; Notable for the following:    Hemoglobin 11.4 (*)    HCT 35.0 (*)    All other components within normal limits  URINALYSIS, ROUTINE W REFLEX MICROSCOPIC - Abnormal; Notable for the following:    APPearance HAZY (*)    Protein, ur 100 (*)    Bacteria, UA RARE (*)    Squamous Epithelial / LPF 6-30 (*)    All other components within normal limits  CBG MONITORING, ED - Abnormal; Notable for the following:    Glucose-Capillary 105 (*)    All other components within normal limits  CBG MONITORING, ED - Abnormal; Notable for the following:    Glucose-Capillary 168 (*)    All other components within normal limits  CBG MONITORING, ED - Abnormal; Notable for the following:    Glucose-Capillary 177 (*)    All other components within normal limits    EKG  EKG Interpretation  Date/Time:  Sunday August 06 2016 12:02:05 EDT Ventricular Rate:  60 PR Interval:    QRS Duration: 113 QT Interval:  490 QTC Calculation: 490 R Axis:   48 Text Interpretation:  Sinus rhythm Prolonged PR interval Borderline intraventricular conduction delay Borderline prolonged QT interval since last tracing no significant change Confirmed by Maxson Oddo  MD,  Vaani Morren ([id number]) on 08/06/2016 2:37:42 PM  Radiology No results found.  Procedures Procedures (including critical care time)  Medications Ordered in ED Medications - No data to display   Initial Impression / Assessment and Plan / ED Course  I have reviewed the triage vital signs and the nursing notes.  Pertinent labs & imaging results that were available during my care of the patient were reviewed by me and considered in my medical decision making (see chart for details).     Patient presents with an episode of hypoglycemia. This resolved by ED arrival. [facility] had no further episodes of hypoglycemia. She was monitored for several hours. She was encouraged to eat regular meals and to follow-up with her primary care provider. She is instructed to keep a close eye and her blood sugars over the next few days. She also was notified that her creatinine is slightly elevated as compared to prior values that we have on file. She was encouraged to have this rechecked by her PCP.  Final Clinical Impressions(s) / ED Diagnoses   Final diagnoses:  Hypoglycemia    New Prescriptions New Prescriptions   No medications on file     Malvin Johns, MD 08/06/16 1506

## 2016-08-06 NOTE — ED Notes (Signed)
CBG 105 

## 2016-08-06 NOTE — ED Triage Notes (Signed)
Pt report feeling weak this morning. Pt states that she did not eat breakfast but has continued to take her diabetes medcations. Pt states that she has had a decreased appetite recently. Pt reports getting dizzy lightheaded and diaphoretic. Pt states that EMS was called and found to have a cbg of 58 then increased to 100.  Pt was given oral glucose, orange juice and a hotdog. Pt alert oriented and no neuro deficits on arrival .

## 2016-08-06 NOTE — Discharge Instructions (Signed)
Your creatinine (kidney function test) was a little worse than that last values that we have on file.  This needs to be followed by your primary care provider.

## 2016-08-22 ENCOUNTER — Ambulatory Visit
Admission: RE | Admit: 2016-08-22 | Discharge: 2016-08-22 | Disposition: A | Discharge status: Home / Self Care | Payer: Medicare Other | Source: Ambulatory Visit | Attending: Internal Medicine | Admitting: Internal Medicine

## 2016-08-22 DIAGNOSIS — Z1231 Encounter for screening mammogram for malignant neoplasm of breast: Secondary | ICD-10-CM

## 2016-12-07 ENCOUNTER — Telehealth: Payer: Self-pay | Admitting: Cardiovascular Disease

## 2016-12-07 NOTE — Telephone Encounter (Signed)
Received records from [facility] for appointment on 01/05/17 with Dr Gwenlyn Found.  Records put with Dr Kennon Holter schedule for 01/05/17. lp

## 2017-01-05 ENCOUNTER — Ambulatory Visit: Payer: Medicare Other | Admitting: Cardiovascular Disease

## 2017-02-09 ENCOUNTER — Ambulatory Visit: Payer: Medicare Other | Admitting: Cardiovascular Disease

## 2017-07-17 ENCOUNTER — Other Ambulatory Visit: Payer: Self-pay | Admitting: Internal Medicine

## 2017-07-17 DIAGNOSIS — Z1231 Encounter for screening mammogram for malignant neoplasm of breast: Secondary | ICD-10-CM

## 2017-08-22 ENCOUNTER — Ambulatory Visit (HOSPITAL_COMMUNITY)
Admission: EM | Admit: 2017-08-22 | Discharge: 2017-08-22 | Disposition: A | Discharge status: Home / Self Care | Payer: Medicare Other | Attending: Family Medicine | Admitting: Family Medicine

## 2017-08-22 ENCOUNTER — Encounter (HOSPITAL_COMMUNITY): Payer: Self-pay | Admitting: Emergency Medicine

## 2017-08-22 DIAGNOSIS — R21 Rash and other nonspecific skin eruption: Secondary | ICD-10-CM | POA: Diagnosis not present

## 2017-08-22 DIAGNOSIS — L298 Other pruritus: Secondary | ICD-10-CM | POA: Diagnosis not present

## 2017-08-22 DIAGNOSIS — L299 Pruritus, unspecified: Secondary | ICD-10-CM

## 2017-08-22 DIAGNOSIS — L209 Atopic dermatitis, unspecified: Secondary | ICD-10-CM | POA: Diagnosis not present

## 2017-08-22 MED ORDER — METHYLPREDNISOLONE SODIUM SUCC 125 MG IJ SOLR
125.0000 mg | Freq: Once | INTRAMUSCULAR | Status: AC
  Administered 2017-08-22: 125 mg via INTRAMUSCULAR

## 2017-08-22 MED ORDER — METHYLPREDNISOLONE SODIUM SUCC 125 MG IJ SOLR
INTRAMUSCULAR | Status: AC
  Filled 2017-08-22: qty 2

## 2017-08-22 MED ORDER — CETIRIZINE-PSEUDOEPHEDRINE ER 5-120 MG PO TB12: 1.0000 | ORAL_TABLET | Freq: Every day | ORAL | 0 refills | Status: DC

## 2017-08-22 MED ORDER — DIPHENHYDRAMINE HCL 25 MG PO TABS: 25.0000 mg | ORAL_TABLET | Freq: Every evening | ORAL | 0 refills | Status: DC | PRN

## 2017-08-22 NOTE — ED Provider Notes (Signed)
[facility]    CSN: [id number] Arrival date & time: 08/22/17  1315     History   Chief Complaint Chief Complaint  Patient presents with  . Rash    HPI Ann [name] is a 67 y.o. female.   Patient also complains of rash that began last week.  She denies changes in soaps, detergents, or anyone with similar symptoms.  She denies any recent changes in medications.  She localizes the rash to her bilatearl arms and face.  She describes it as itchy, and spreading.  She has tried OTC lotions and powder without relief.  She denies aggravating symptoms.  She denies having previous symptoms in the past.        Past Medical History:  Diagnosis Date  . Anemia 09/29/2008   anemia of chronic disease  . Anginal pain ([place])    no recent chest pain  . Asthma 09/29/2008  . CAD (coronary artery disease) 09/29/2008   hx  [facility] - [facility]. Stress test 10/2011 nl with EF 58%. [place] card., last cath 2010  . Chest pain 02/16/2009  . Chronic kidney disease (CKD), stage III (moderate) ([place]) 10/20/2009  . GERD (gastroesophageal reflux disease)   . Hyperlipidemia 10/21/2009  . Hypertension 09/29/2008  . Insulin dependent diabetes mellitus type IA ([place]) 09/29/2008  . Lumbar back pain 02/16/2009   states pain goes down right leg  . Myalgia 02/16/2009  . Shortness of breath    with exerction  . Shoulder pain, left 03/16/2009   occurs at night  . Sleep apnea    CPAP    Patient Active Problem List   Diagnosis Date Noted  . Morbid obesity ([place]) 01/28/2013  . 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 Health care maintenance 03/25/2012  . Acute blood loss anemia 12/01/2011    Class: Acute  . 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 Plantar fasciitis of right foot 09/28/2011  . Breast pain 03/08/2011  . 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 Knee pain, right 01/20/2011  . Thoracic spine pain 12/13/2010  . DYSLIPIDEMIA  10/21/2009  . CHRONIC KIDNEY DISEASE STAGE III (MODERATE) 10/20/2009  . UNSPECIFIED ANEMIA 09/29/2008  . HYPERTENSION 09/29/2008  . Coronary atherosclerosis 09/29/2008  . ASTHMA, UNSPECIFIED, UNSPECIFIED STATUS 09/29/2008    Past Surgical History:  Procedure Laterality Date  . ABDOMINAL HYSTERECTOMY    . APPENDECTOMY    . CARDIAC CATHETERIZATION  2008   in [place], [place] stable  . CARDIAC CATHETERIZATION  2010   patent stents in mid and distal RCA; high grade disease in prox & mid AV groove LCX& OM; mod disease in prox & mid LAD normal LV FUNCTION--TREATED MEDICALLY    . CATARACT EXTRACTION, BILATERAL    . CHOLECYSTECTOMY    . CORONARY ANGIOPLASTY WITH STENT PLACEMENT  2005   2 vessel PCI at [place].  [place]   EF 60%  . TOTAL KNEE ARTHROPLASTY  12/2011   Left  . TOTAL KNEE ARTHROPLASTY  11/28/2011   Procedure: TOTAL KNEE ARTHROPLASTY;  Surgeon: Hessie Dibble, MD;  Location: [facility];  Service: Orthopedics;  Laterality: Right;    OB History   None      Home Medications    Prior to Admission medications   Medication Sig Start Date End Date Taking? Authorizing Provider  ACCU-CHEK FASTCLIX LANCETS MISC USE AS DIRECTED TO CHECK BLOOD SUGAR FOUR TIMES A DAY(BEFORE  MEALS AND AT BEDTIME). Patient not taking: Reported on 08/06/2016 10/14/12   Sid Falcon, MD  ACCU-CHEK SMARTVIEW test strip CHECK BLOOD SUGAR BEFORE MEALS AND AT BEDTIME Patient not taking: Reported on 08/06/2016 10/14/12   Sid Falcon, MD  amLODipine (NORVASC) 10 MG tablet TAKE 1 TABLET BY MOUTH DAILY 02/01/12   Neta Ehlers, MD  cetirizine (ZYRTEC) 10 MG chewable tablet Chew 1 tablet (10 mg total) by mouth daily. Patient taking differently: Chew 10 mg by mouth daily as needed for allergies.  01/17/12   Ho, Carylon Perches, MD  cetirizine-pseudoephedrine (ZYRTEC-D) 5-120 MG tablet Take 1 tablet by mouth daily. 08/22/17   Maythe Deramo, [place], PA-C  diphenhydrAMINE (BENADRYL) 25 MG tablet Take 1  tablet (25 mg total) by mouth at bedtime as needed for itching. 08/22/17   Roniya Tetro, [place], PA-C  ergocalciferol (VITAMIN D2) 50000 units capsule Take 50,000 Units by mouth once a week.    [provider]  fluticasone (FLONASE) 50 MCG/ACT nasal spray INHALE 1 SPRAY IN EACH NOSTRIL EVERY DAY Patient taking differently: INHALE 2 SPRAY IN EACH NOSTRIL EVERY DAY AS NEEDED FOR CONGESTION 05/24/12   Neta Ehlers, MD  gabapentin (NEURONTIN) 100 MG capsule Take 100 mg by mouth 3 (three) times daily.    [provider]  guaiFENesin (MUCINEX) 600 MG 12 hr tablet Take 1 tablet (600 mg total) by mouth 2 (two) times daily. Patient not taking: Reported on 01/05/2015 04/05/14   Dahlia Bailiff, PA-C  insulin aspart (NOVOLOG FLEXPEN) 100 UNIT/ML injection Inject 3 units before each meal Patient taking differently: Inject 5 Units into the skin 3 (three) times daily with meals.  01/17/12   Ho, Carylon Perches, MD  insulin detemir (LEVEMIR) 100 UNIT/ML injection Inject 70 Units into the skin at bedtime.    [provider]  Insulin Pen Needle (EASY TOUCH PEN NEEDLES) 31G X 8 MM MISC Use to inject insulin as instructed. Patient not taking: Reported on 08/06/2016 02/02/12   Bartholomew Crews, MD  isosorbide mononitrate (IMDUR) 30 MG 24 hr tablet TAKE 1 TABLET BY MOUTH ONCE DAILY Patient not taking: Reported on 08/06/2016 11/04/14   Lorretta Harp, MD  linagliptin (TRADJENTA) 5 MG TABS tablet Take 5 mg by mouth daily.    [provider]  lisinopril-hydrochlorothiazide (PRINZIDE,ZESTORETIC) 20-12.5 MG per tablet TAKE 2 TABLETS BY MOUTH EVERY DAY 02/05/12   Dominic Pea, DO  metoprolol (LOPRESSOR) 50 MG tablet Take 50 mg by mouth 2 (two) times daily.  02/05/12   Dominic Pea, DO  Multiple Vitamins-Iron (DAILY-VITAMIN/IRON) TABS Take one tablet daily Patient not taking: Reported on 08/06/2016 01/17/12   Ansel Bong, MD  pantoprazole (PROTONIX) 20 MG tablet TAKE 1 TABLET BY MOUTH DAILY  02/05/12   Dominic Pea, DO  pravastatin (PRAVACHOL) 40 MG tablet TAKE ONE TABLET BY MOUTH EVERY EVENING 06/28/12   Neta Ehlers, MD  PROAIR HFA 108 (90 BASE) MCG/ACT inhaler INHALE 1-2 PUFFS BY MOUTH EVERY 4 HOURS AS NEEDED FOR SHORTNESS OF BREATH 02/01/12   Neta Ehlers, MD  tiZANidine (ZANAFLEX) 4 MG tablet Take 4 mg by mouth every 6 (six) hours as needed for muscle spasms.    [provider]    Family History Family History  Problem Relation Age of Onset  . Heart attack Father   . Heart disease Sister   . Heart disease Brother   . Diabetes Mother   . Hypertension Mother   . Heart disease Mother   .  Heart attack Mother   . Stroke Mother   . Colon cancer Neg Hx     Social History Social History   Tobacco Use  . Smoking status: Never Smoker  . Smokeless tobacco: Current User    Types: Snuff  . Tobacco comment: using "all my life"  Substance Use Topics  . Alcohol use: No    Alcohol/week: 0.0 oz  . Drug use: No     Allergies   Codeine and Metronidazole   Review of Systems Review of Systems  Constitutional: Negative for chills and fever.  HENT: Positive for rhinorrhea and sneezing.   Respiratory: Positive for cough.   Cardiovascular: Negative for chest pain.  Gastrointestinal: Negative for nausea and vomiting.  Skin: Positive for rash.     Physical Exam Triage Vital Signs ED Triage Vitals [08/22/17 1400]  Enc Vitals Group     BP (!) 172/91     Pulse Rate 100     Resp 18     Temp 98 F (36.7 C)     Temp Source Oral     SpO2 96 %     Weight      Height      Head Circumference      Peak Flow      Pain Score      Pain Loc      Pain Edu?      Excl. in [place]?    No data found.  Updated Vital Signs BP (!) 172/91 (BP Location: Left Arm)   Pulse 100   Temp 98 F (36.7 C) (Oral)   Resp 18   SpO2 96%   Physical Exam  Constitutional: She is oriented to person, place, and time. She appears well-developed and well-nourished. No distress.    HENT:  Head: Normocephalic and atraumatic.  Right Ear: External ear normal.  Left Ear: External ear normal.  Nose: Nose normal.  Mouth/Throat: Oropharynx is clear and moist. No oropharyngeal exudate.  Eyes: Pupils are equal, round, and reactive to light. EOM are normal. No scleral icterus.  Neck: Normal range of motion. Neck supple.  Cardiovascular: Normal rate, regular rhythm and normal heart sounds. Exam reveals no gallop and no friction rub.  No murmur heard. Radial pulse 2+ bilaterally    Pulmonary/Chest: Effort normal and breath sounds normal. No stridor. No respiratory distress. She has no wheezes. She has no rales.  Speaking in full sentences without difficulty  Lymphadenopathy:    She has no cervical adenopathy.  Neurological: She is alert and oriented to person, place, and time.  Skin: Skin is warm and dry. Capillary refill takes 2 to 3 seconds. Rash noted. She is not diaphoretic.  Mild tenting dorsal aspect bilateral hands  Slightly raised papular rash diffuse about the bilateral anterior forearms, anterior chest, bilateral cheeks and forehead.  No obvious signs of erythema, swelling, or discharge.  Psychiatric: She has a normal mood and affect. Her behavior is normal. Judgment and thought content normal.  Vitals reviewed.    UC Treatments / Results  Labs (all labs ordered are listed, but only abnormal results are displayed) Labs Reviewed - No data to display  EKG None  Radiology No results found.  Procedures Procedures (including critical care time)  Medications Ordered in UC Medications  methylPREDNISolone sodium succinate (SOLU-MEDROL) 125 mg/2 mL injection 125 mg (125 mg Intramuscular Given 08/22/17 1446)    Initial Impression / Assessment and Plan / UC Course  I have reviewed the triage vital signs and the  nursing notes.  Pertinent labs & imaging results that were available during my care of the patient were reviewed by me and considered in my medical  decision making (see chart for details).   Patient complains of itchy rash for one week.  History, symptoms and PE most consistent with atopic dermatitis.  Given methylprednisolone in office for symptomatic relief.  Patient reports hx of well-controlled diabetes.  Patient understand and is aware that the steroid injection may raise her blood glucose and to go to the ER if she experiences high and uncontrollable blood glucose.  Zyrtec prescribed for daytime relief, and benadryl prescribed for nighttime relief. Return and ER precautions given.     Final Clinical Impressions(s) / UC Diagnoses   Final diagnoses:  Atopic dermatitis, unspecified type  Itching     Discharge Instructions     Steroid shot given in office Prescribed zyrtec to use for daytime relief of symptoms Prescribed benadryl as needed for nighttime relief of symptoms Continue to moisturize and avoid hot showers Follow up with PCP if symptoms persists Return or go to ER if you have any new or worsening symptoms    ED Prescriptions    Medication Sig Dispense Auth. Provider   cetirizine-pseudoephedrine (ZYRTEC-D) 5-120 MG tablet Take 1 tablet by mouth daily. 30 tablet Jisella Ashenfelter, [place], PA-C   diphenhydrAMINE (BENADRYL) 25 MG tablet Take 1 tablet (25 mg total) by mouth at bedtime as needed for itching. 20 tablet Stacey Drain, [place], PA-C     Controlled Substance Prescriptions [place] Controlled Substance Registry consulted? Not Applicable   Lestine Box, [place] 08/22/17 1503

## 2017-08-22 NOTE — Discharge Instructions (Addendum)
Steroid shot given in office Prescribed zyrtec to use for daytime relief of symptoms Prescribed benadryl as needed for nighttime relief of symptoms Continue to moisturize and avoid hot showers Follow up with PCP if symptoms persists Return or go to ER if you have any new or worsening symptoms

## 2017-08-22 NOTE — ED Triage Notes (Signed)
Pt sts itchy rash to neck and arms

## 2017-08-23 ENCOUNTER — Ambulatory Visit
Admission: RE | Admit: 2017-08-23 | Discharge: 2017-08-23 | Disposition: A | Discharge status: Home / Self Care | Payer: Medicare Other | Source: Ambulatory Visit | Attending: Internal Medicine | Admitting: Internal Medicine

## 2017-08-23 DIAGNOSIS — Z1231 Encounter for screening mammogram for malignant neoplasm of breast: Secondary | ICD-10-CM

## 2018-06-04 ENCOUNTER — Other Ambulatory Visit: Payer: Self-pay | Admitting: Internal Medicine

## 2018-06-04 DIAGNOSIS — Z13820 Encounter for screening for osteoporosis: Secondary | ICD-10-CM

## 2018-06-14 ENCOUNTER — Ambulatory Visit: Payer: Medicare Other | Admitting: Cardiovascular Disease

## 2018-06-26 ENCOUNTER — Ambulatory Visit: Payer: Medicare Other | Admitting: Cardiovascular Disease

## 2018-06-27 ENCOUNTER — Encounter: Payer: Self-pay | Admitting: *Deleted

## 2018-07-25 ENCOUNTER — Other Ambulatory Visit: Payer: Self-pay | Admitting: Internal Medicine

## 2018-07-25 DIAGNOSIS — Z1231 Encounter for screening mammogram for malignant neoplasm of breast: Secondary | ICD-10-CM

## 2018-08-06 ENCOUNTER — Other Ambulatory Visit: Payer: Medicare Other

## 2018-10-07 ENCOUNTER — Other Ambulatory Visit: Payer: Medicare Other

## 2018-10-07 ENCOUNTER — Ambulatory Visit: Payer: Medicare Other

## 2019-02-20 ENCOUNTER — Other Ambulatory Visit: Payer: Self-pay

## 2019-02-20 ENCOUNTER — Inpatient Hospital Stay (HOSPITAL_COMMUNITY)
Admission: EM | Admit: 2019-02-20 | Discharge: 2019-02-28 | DRG: 689 | Disposition: A | Discharge status: Skilled Nursing Facility | Payer: Medicare Other | Attending: Internal Medicine | Admitting: Internal Medicine

## 2019-02-20 ENCOUNTER — Encounter (HOSPITAL_COMMUNITY): Payer: Self-pay | Admitting: Family Medicine

## 2019-02-20 ENCOUNTER — Emergency Department (HOSPITAL_COMMUNITY): Payer: Medicare Other

## 2019-02-20 DIAGNOSIS — E66813 Obesity, class 3: Secondary | ICD-10-CM | POA: Diagnosis present

## 2019-02-20 DIAGNOSIS — T426X5A Adverse effect of other antiepileptic and sedative-hypnotic drugs, initial encounter: Secondary | ICD-10-CM | POA: Diagnosis present

## 2019-02-20 DIAGNOSIS — N184 Chronic kidney disease, stage 4 (severe): Secondary | ICD-10-CM | POA: Diagnosis present

## 2019-02-20 DIAGNOSIS — Z6841 Body Mass Index (BMI) 40.0 and over, adult: Secondary | ICD-10-CM

## 2019-02-20 DIAGNOSIS — K5900 Constipation, unspecified: Secondary | ICD-10-CM

## 2019-02-20 DIAGNOSIS — N10 Acute tubulo-interstitial nephritis: Secondary | ICD-10-CM | POA: Diagnosis not present

## 2019-02-20 DIAGNOSIS — Z9841 Cataract extraction status, right eye: Secondary | ICD-10-CM

## 2019-02-20 DIAGNOSIS — J45909 Unspecified asthma, uncomplicated: Secondary | ICD-10-CM | POA: Diagnosis present

## 2019-02-20 DIAGNOSIS — N179 Acute kidney failure, unspecified: Secondary | ICD-10-CM | POA: Diagnosis present

## 2019-02-20 DIAGNOSIS — I120 Hypertensive chronic kidney disease with stage 5 chronic kidney disease or end stage renal disease: Secondary | ICD-10-CM | POA: Diagnosis present

## 2019-02-20 DIAGNOSIS — G9340 Encephalopathy, unspecified: Secondary | ICD-10-CM | POA: Diagnosis not present

## 2019-02-20 DIAGNOSIS — N12 Tubulo-interstitial nephritis, not specified as acute or chronic: Secondary | ICD-10-CM | POA: Diagnosis present

## 2019-02-20 DIAGNOSIS — I700 Atherosclerosis of aorta: Secondary | ICD-10-CM | POA: Diagnosis present

## 2019-02-20 DIAGNOSIS — Z96653 Presence of artificial knee joint, bilateral: Secondary | ICD-10-CM | POA: Diagnosis present

## 2019-02-20 DIAGNOSIS — R739 Hyperglycemia, unspecified: Secondary | ICD-10-CM

## 2019-02-20 DIAGNOSIS — Z823 Family history of stroke: Secondary | ICD-10-CM

## 2019-02-20 DIAGNOSIS — Z794 Long term (current) use of insulin: Secondary | ICD-10-CM

## 2019-02-20 DIAGNOSIS — E785 Hyperlipidemia, unspecified: Secondary | ICD-10-CM | POA: Diagnosis present

## 2019-02-20 DIAGNOSIS — D631 Anemia in chronic kidney disease: Secondary | ICD-10-CM | POA: Diagnosis present

## 2019-02-20 DIAGNOSIS — E1065 Type 1 diabetes mellitus with hyperglycemia: Secondary | ICD-10-CM

## 2019-02-20 DIAGNOSIS — Z9842 Cataract extraction status, left eye: Secondary | ICD-10-CM

## 2019-02-20 DIAGNOSIS — I2510 Atherosclerotic heart disease of native coronary artery without angina pectoris: Secondary | ICD-10-CM | POA: Diagnosis present

## 2019-02-20 DIAGNOSIS — G92 Toxic encephalopathy: Secondary | ICD-10-CM | POA: Diagnosis present

## 2019-02-20 DIAGNOSIS — M545 Low back pain: Secondary | ICD-10-CM | POA: Diagnosis present

## 2019-02-20 DIAGNOSIS — T481X5A Adverse effect of skeletal muscle relaxants [neuromuscular blocking agents], initial encounter: Secondary | ICD-10-CM | POA: Diagnosis present

## 2019-02-20 DIAGNOSIS — Z9049 Acquired absence of other specified parts of digestive tract: Secondary | ICD-10-CM

## 2019-02-20 DIAGNOSIS — F17290 Nicotine dependence, other tobacco product, uncomplicated: Secondary | ICD-10-CM | POA: Diagnosis present

## 2019-02-20 DIAGNOSIS — Z90710 Acquired absence of both cervix and uterus: Secondary | ICD-10-CM

## 2019-02-20 DIAGNOSIS — G8929 Other chronic pain: Secondary | ICD-10-CM | POA: Diagnosis present

## 2019-02-20 DIAGNOSIS — R4182 Altered mental status, unspecified: Secondary | ICD-10-CM

## 2019-02-20 DIAGNOSIS — K5730 Diverticulosis of large intestine without perforation or abscess without bleeding: Secondary | ICD-10-CM | POA: Diagnosis present

## 2019-02-20 DIAGNOSIS — E1122 Type 2 diabetes mellitus with diabetic chronic kidney disease: Secondary | ICD-10-CM | POA: Diagnosis present

## 2019-02-20 DIAGNOSIS — G4730 Sleep apnea, unspecified: Secondary | ICD-10-CM | POA: Diagnosis present

## 2019-02-20 DIAGNOSIS — Z8249 Family history of ischemic heart disease and other diseases of the circulatory system: Secondary | ICD-10-CM

## 2019-02-20 DIAGNOSIS — Z955 Presence of coronary angioplasty implant and graft: Secondary | ICD-10-CM

## 2019-02-20 DIAGNOSIS — Z885 Allergy status to narcotic agent status: Secondary | ICD-10-CM

## 2019-02-20 DIAGNOSIS — Z79899 Other long term (current) drug therapy: Secondary | ICD-10-CM

## 2019-02-20 DIAGNOSIS — Z20828 Contact with and (suspected) exposure to other viral communicable diseases: Secondary | ICD-10-CM | POA: Diagnosis present

## 2019-02-20 DIAGNOSIS — N186 End stage renal disease: Secondary | ICD-10-CM | POA: Diagnosis present

## 2019-02-20 DIAGNOSIS — I10 Essential (primary) hypertension: Secondary | ICD-10-CM | POA: Diagnosis present

## 2019-02-20 DIAGNOSIS — E1165 Type 2 diabetes mellitus with hyperglycemia: Secondary | ICD-10-CM | POA: Diagnosis present

## 2019-02-20 DIAGNOSIS — Z833 Family history of diabetes mellitus: Secondary | ICD-10-CM

## 2019-02-20 DIAGNOSIS — N200 Calculus of kidney: Secondary | ICD-10-CM | POA: Diagnosis present

## 2019-02-20 DIAGNOSIS — Z888 Allergy status to other drugs, medicaments and biological substances status: Secondary | ICD-10-CM

## 2019-02-20 DIAGNOSIS — Z7951 Long term (current) use of inhaled steroids: Secondary | ICD-10-CM

## 2019-02-20 LAB — URINALYSIS, ROUTINE W REFLEX MICROSCOPIC
Bilirubin Urine: NEGATIVE
Glucose, UA: >=500 mg/dL — AB
Ketones, ur: NEGATIVE mg/dL
Nitrite: NEGATIVE
Protein, ur: >=300 mg/dL — AB
Specific Gravity, Urine: 1.013 (ref 1.005–1.030)
WBC, UA: >50 WBC/hpf — ABNORMAL HIGH (ref 0–5)
pH: 5 (ref 5.0–8.0)

## 2019-02-20 LAB — COMPREHENSIVE METABOLIC PANEL
ALT: 13 U/L (ref 0–44)
AST: 16 U/L (ref 15–41)
Albumin: 3.2 g/dL — ABNORMAL LOW (ref 3.5–5.0)
Alkaline Phosphatase: 61 U/L (ref 38–126)
Anion gap: 11 (ref 5–15)
BUN: 34 mg/dL — ABNORMAL HIGH (ref 8–23)
CO2: 22 mmol/L (ref 22–32)
Calcium: 9.5 mg/dL (ref 8.9–10.3)
Chloride: 103 mmol/L (ref 98–111)
Creatinine, Ser: 3.38 mg/dL — ABNORMAL HIGH (ref 0.44–1.00)
GFR calc Af Amer: 15 mL/min — ABNORMAL LOW (ref >60)
GFR calc non Af Amer: 13 mL/min — ABNORMAL LOW (ref >60)
Glucose, Bld: 395 mg/dL — ABNORMAL HIGH (ref 70–99)
Potassium: 5.3 mmol/L — ABNORMAL HIGH (ref 3.5–5.1)
Sodium: 136 mmol/L (ref 135–145)
Total Bilirubin: 0.4 mg/dL (ref 0.3–1.2)
Total Protein: 6.7 g/dL (ref 6.5–8.1)

## 2019-02-20 LAB — CBC WITH DIFFERENTIAL/PLATELET
Abs Immature Granulocytes: 0.03 10*3/uL (ref 0.00–0.07)
Basophils Absolute: 0 10*3/uL (ref 0.0–0.1)
Basophils Relative: 0 %
Eosinophils Absolute: 0.1 10*3/uL (ref 0.0–0.5)
Eosinophils Relative: 1 %
HCT: 32.2 % — ABNORMAL LOW (ref 36.0–46.0)
Hemoglobin: 10.3 g/dL — ABNORMAL LOW (ref 12.0–15.0)
Immature Granulocytes: 0 %
Lymphocytes Relative: 21 %
Lymphs Abs: 1.6 10*3/uL (ref 0.7–4.0)
MCH: 28.6 pg (ref 26.0–34.0)
MCHC: 32 g/dL (ref 30.0–36.0)
MCV: 89.4 fL (ref 80.0–100.0)
Monocytes Absolute: 0.6 10*3/uL (ref 0.1–1.0)
Monocytes Relative: 8 %
Neutro Abs: 5.3 10*3/uL (ref 1.7–7.7)
Neutrophils Relative %: 70 %
Platelets: 215 10*3/uL (ref 150–400)
RBC: 3.6 MIL/uL — ABNORMAL LOW (ref 3.87–5.11)
RDW: 13.7 % (ref 11.5–15.5)
WBC: 7.6 10*3/uL (ref 4.0–10.5)
nRBC: 0 % (ref 0.0–0.2)

## 2019-02-20 LAB — CBG MONITORING, ED: Glucose-Capillary: 347 mg/dL — ABNORMAL HIGH (ref 70–99)

## 2019-02-20 MED ORDER — METOPROLOL TARTRATE 50 MG PO TABS
50.0000 mg | ORAL_TABLET | Freq: Two times a day (BID) | ORAL | Status: DC
  Administered 2019-02-21 – 2019-02-28 (×15): 50 mg via ORAL
  Filled 2019-02-20 (×16): qty 1

## 2019-02-20 MED ORDER — SODIUM CHLORIDE 0.9% FLUSH
3.0000 mL | Freq: Two times a day (BID) | INTRAVENOUS | Status: DC
  Administered 2019-02-22 – 2019-02-25 (×5): 3 mL via INTRAVENOUS

## 2019-02-20 MED ORDER — ONDANSETRON HCL 4 MG PO TABS: 4.0000 mg | ORAL_TABLET | Freq: Four times a day (QID) | ORAL | Status: DC | PRN

## 2019-02-20 MED ORDER — SODIUM CHLORIDE 0.9 % IV SOLN
1.0000 g | Freq: Once | INTRAVENOUS | Status: AC
  Administered 2019-02-20: 1 g via INTRAVENOUS
  Filled 2019-02-20: qty 10

## 2019-02-20 MED ORDER — INSULIN ASPART 100 UNIT/ML ~~LOC~~ SOLN
0.0000 [IU] | Freq: Three times a day (TID) | SUBCUTANEOUS | Status: DC
  Administered 2019-02-21 – 2019-02-22 (×2): 1 [IU] via SUBCUTANEOUS
  Administered 2019-02-22 – 2019-02-24 (×2): 2 [IU] via SUBCUTANEOUS
  Administered 2019-02-25 (×2): 1 [IU] via SUBCUTANEOUS
  Administered 2019-02-25 – 2019-02-26 (×2): 2 [IU] via SUBCUTANEOUS
  Administered 2019-02-26 (×2): 1 [IU] via SUBCUTANEOUS
  Administered 2019-02-27: 2 [IU] via SUBCUTANEOUS

## 2019-02-20 MED ORDER — HEPARIN SODIUM (PORCINE) 5000 UNIT/ML IJ SOLN
5000.0000 [IU] | Freq: Three times a day (TID) | INTRAMUSCULAR | Status: DC
  Administered 2019-02-21 – 2019-02-28 (×24): 5000 [IU] via SUBCUTANEOUS
  Filled 2019-02-20 (×23): qty 1

## 2019-02-20 MED ORDER — INSULIN ASPART 100 UNIT/ML ~~LOC~~ SOLN
0.0000 [IU] | Freq: Every day | SUBCUTANEOUS | Status: DC
  Administered 2019-02-21: 4 [IU] via SUBCUTANEOUS

## 2019-02-20 MED ORDER — AMLODIPINE BESYLATE 10 MG PO TABS
10.0000 mg | ORAL_TABLET | Freq: Every day | ORAL | Status: DC
  Administered 2019-02-21 – 2019-02-28 (×8): 10 mg via ORAL
  Filled 2019-02-20 (×3): qty 1
  Filled 2019-02-20: qty 2
  Filled 2019-02-20 (×4): qty 1

## 2019-02-20 MED ORDER — PRAVASTATIN SODIUM 10 MG PO TABS
10.0000 mg | ORAL_TABLET | Freq: Every day | ORAL | Status: DC
  Administered 2019-02-21 – 2019-02-27 (×7): 10 mg via ORAL
  Filled 2019-02-20 (×7): qty 1

## 2019-02-20 MED ORDER — ISOSORBIDE MONONITRATE ER 30 MG PO TB24
30.0000 mg | ORAL_TABLET | Freq: Every day | ORAL | Status: DC
  Administered 2019-02-21 – 2019-02-28 (×8): 30 mg via ORAL
  Filled 2019-02-20 (×8): qty 1

## 2019-02-20 MED ORDER — SODIUM CHLORIDE 0.9 % IV SOLN
1.0000 g | INTRAVENOUS | Status: DC
  Administered 2019-02-21 – 2019-02-23 (×3): 1 g via INTRAVENOUS
  Filled 2019-02-20 (×2): qty 1
  Filled 2019-02-20 (×2): qty 10

## 2019-02-20 MED ORDER — ONDANSETRON HCL 4 MG/2ML IJ SOLN: 4.0000 mg | Freq: Four times a day (QID) | INTRAMUSCULAR | Status: DC | PRN

## 2019-02-20 MED ORDER — INSULIN GLARGINE 100 UNIT/ML ~~LOC~~ SOLN
30.0000 [IU] | Freq: Every day | SUBCUTANEOUS | Status: DC
  Administered 2019-02-21 – 2019-02-22 (×3): 30 [IU] via SUBCUTANEOUS
  Filled 2019-02-20 (×5): qty 0.3

## 2019-02-20 MED ORDER — SODIUM CHLORIDE 0.9 % IV SOLN
INTRAVENOUS | Status: AC
  Administered 2019-02-21: 01:00:00 via INTRAVENOUS

## 2019-02-20 MED ORDER — ACETAMINOPHEN 325 MG PO TABS: 650.0000 mg | ORAL_TABLET | Freq: Four times a day (QID) | ORAL | Status: DC | PRN

## 2019-02-20 MED ORDER — ACETAMINOPHEN 650 MG RE SUPP: 650.0000 mg | Freq: Four times a day (QID) | RECTAL | Status: DC | PRN

## 2019-02-20 NOTE — ED Triage Notes (Signed)
Pt here from home with gcems with co hyperglycemia and rt flank pain that radiates around to the front. Possible UTI. Pt has some slight confusion with family that started last night with UTI symptoms. EMS gave 250 of NS. CBG in ED was 347.

## 2019-02-20 NOTE — ED Notes (Signed)
Patient transported to CT 

## 2019-02-20 NOTE — ED Notes (Signed)
RN not available for report. Will attempt call back in 10-15 minutes

## 2019-02-20 NOTE — ED Notes (Addendum)
ED TO INPATIENT HANDOFF REPORT  ED Nurse Name and Phone #:  [phone]  S Name/Age/Gender Ann [name] 68 y.o. female Room/Bed: 029C/029C  Code Status   Code Status: Full Code  Home/SNF/Other Home Patient oriented to: self, situation, and place Is this baseline? No   Triage Complete: Triage complete  Chief Complaint Hyperglycemia, Possible UTI  Triage Note Pt here from home with gcems with co hyperglycemia and rt flank pain that radiates around to the front. Possible UTI. Pt has some slight confusion with family that started last night with UTI symptoms. EMS gave 250 of NS. CBG in ED was 347.                                                                                                                                       Allergies Allergies  Allergen Reactions  . Codeine Other (See Comments)    REACTION: nausea and vomiting  . Metronidazole Itching    Level of Care/Admitting Diagnosis ED Disposition    ED Disposition Condition [facility]: [place] [[id number]]  Level of Care: Telemetry Medical [104]  I expect the patient will be discharged within 24 hours: Yes  LOW acuity---Tx typically complete <24 hrs---ACUTE conditions typically can be evaluated <24 hours---LABS likely to return to acceptable levels <24 hours---IS near functional baseline---EXPECTED to return to current living arrangement---NOT newly hypoxic: Does not meet criteria for 5C-Observation unit  Covid Evaluation: Asymptomatic Screening Protocol (No Symptoms)  Diagnosis: Pyelonephritis [[id number]]  Admitting Physician: Vianne Bulls [[id number]]  Attending Physician: Vianne Bulls [[id number]]  PT Class (Do Not Modify): Observation [104]  PT Acc Code (Do Not Modify): Observation [10022]       B Medical/Surgery History Past Medical History:  Diagnosis Date  . Anemia 09/29/2008   anemia of chronic disease  . Anginal pain ([place])    no recent chest pain  . Asthma  09/29/2008  . CAD (coronary artery disease) 09/29/2008   hx  PCI - [facility]. Stress test 10/2011 nl with EF 58%. [employer] card., last cath 2010  . Chest pain 02/16/2009  . Chronic kidney disease (CKD), stage III (moderate) 10/20/2009  . GERD (gastroesophageal reflux disease)   . Hyperlipidemia 10/21/2009  . Hypertension 09/29/2008  . Insulin dependent diabetes mellitus type IA ([place]) 09/29/2008  . Lumbar back pain 02/16/2009   states pain goes down right leg  . Myalgia 02/16/2009  . Shortness of breath    with exerction  . Shoulder pain, left 03/16/2009   occurs at night  . Sleep apnea    CPAP   Past Surgical History:  Procedure Laterality Date  . ABDOMINAL HYSTERECTOMY    . APPENDECTOMY    . CARDIAC CATHETERIZATION  2008   in [place], [place] stable  . CARDIAC CATHETERIZATION  2010   patent stents in mid and distal RCA; high grade disease  in prox & mid AV groove LCX& OM; mod disease in prox & mid LAD normal LV FUNCTION--TREATED MEDICALLY    . CATARACT EXTRACTION, BILATERAL    . CHOLECYSTECTOMY    . CORONARY ANGIOPLASTY WITH STENT PLACEMENT  2005   2 vessel PCI at [facility].  [facility]   EF 60%  . TOTAL KNEE ARTHROPLASTY  12/2011   Left  . TOTAL KNEE ARTHROPLASTY  11/28/2011   Procedure: TOTAL KNEE ARTHROPLASTY;  Surgeon: Hessie Dibble, MD;  Location: [facility];  Service: Orthopedics;  Laterality: Right;     A IV Location/Drains/Wounds Patient Lines/Drains/Airways Status   Active Line/Drains/Airways    Name:   Placement date:   Placement time:   Site:   Days:   Peripheral IV 02/20/19 Left Antecubital   02/20/19    1716    Antecubital   less than 1   Incision 11/28/11 Knee Right   11/28/11    0825     2641          Intake/Output Last 24 hours No intake or output data in the 24 hours ending 02/20/19 2207  Labs/Imaging Results for orders placed or performed during the hospital encounter of 02/20/19 (from the past 48 hour(s))  CBG monitoring,  ED     Status: Abnormal   Collection Time: 02/20/19  5:14 PM  Result Value Ref Range   Glucose-Capillary 347 (H) 70 - 99 mg/dL   Comment 1 Notify RN    Comment 2 Document in Chart   CBC with Differential     Status: Abnormal   Collection Time: 02/20/19  6:00 PM  Result Value Ref Range   WBC 7.6 4.0 - 10.5 K/uL   RBC 3.60 (L) 3.87 - 5.11 MIL/uL   Hemoglobin 10.3 (L) 12.0 - 15.0 g/dL   HCT 32.2 (L) 36.0 - 46.0 %   MCV 89.4 80.0 - 100.0 fL   MCH 28.6 26.0 - 34.0 pg   MCHC 32.0 30.0 - 36.0 g/dL   RDW 13.7 11.5 - 15.5 %   Platelets 215 150 - 400 K/uL   nRBC 0.0 0.0 - 0.2 %   Neutrophils Relative % 70 %   Neutro Abs 5.3 1.7 - 7.7 K/uL   Lymphocytes Relative 21 %   Lymphs Abs 1.6 0.7 - 4.0 K/uL   Monocytes Relative 8 %   Monocytes Absolute 0.6 0.1 - 1.0 K/uL   Eosinophils Relative 1 %   Eosinophils Absolute 0.1 0.0 - 0.5 K/uL   Basophils Relative 0 %   Basophils Absolute 0.0 0.0 - 0.1 K/uL   Immature Granulocytes 0 %   Abs Immature Granulocytes 0.03 0.00 - 0.07 K/uL    Comment: Performed at [facility], [address] [place], [place] [address]  Comprehensive metabolic panel     Status: Abnormal   Collection Time: 02/20/19  6:00 PM  Result Value Ref Range   Sodium 136 135 - 145 mmol/L   Potassium 5.3 (H) 3.5 - 5.1 mmol/L   Chloride 103 98 - 111 mmol/L   CO2 22 22 - 32 mmol/L   Glucose, Bld 395 (H) 70 - 99 mg/dL   BUN 34 (H) 8 - 23 mg/dL   Creatinine, Ser 3.38 (H) 0.44 - 1.00 mg/dL   Calcium 9.5 8.9 - 10.3 mg/dL   Total Protein 6.7 6.5 - 8.1 g/dL   Albumin 3.2 (L) 3.5 - 5.0 g/dL   AST 16 15 - 41 U/L  ALT 13 0 - 44 U/L   Alkaline Phosphatase 61 38 - 126 U/L   Total Bilirubin 0.4 0.3 - 1.2 mg/dL   GFR calc non Af Amer 13 (L) >60 mL/min   GFR calc Af Amer 15 (L) >60 mL/min   Anion gap 11 5 - 15    Comment: Performed at [facility] [address] [place], [place] [address]  Urinalysis, Routine w reflex microscopic     Status: Abnormal   Collection Time:  02/20/19  7:00 PM  Result Value Ref Range   Color, Urine YELLOW YELLOW   APPearance CLOUDY (A) CLEAR   Specific Gravity, Urine 1.013 1.005 - 1.030   pH 5.0 5.0 - 8.0   Glucose, UA >=500 (A) NEGATIVE mg/dL   Hgb urine dipstick SMALL (A) NEGATIVE   Bilirubin Urine NEGATIVE NEGATIVE   Ketones, ur NEGATIVE NEGATIVE mg/dL   Protein, ur >=300 (A) NEGATIVE mg/dL   Nitrite NEGATIVE NEGATIVE   Leukocytes,Ua TRACE (A) NEGATIVE   RBC / HPF 0-5 0 - 5 RBC/hpf   WBC, UA >50 (H) 0 - 5 WBC/hpf   Bacteria, UA MANY (A) NONE SEEN   Squamous Epithelial / LPF 0-5 0 - 5   WBC Clumps PRESENT    Mucus PRESENT    Hyaline Casts, UA PRESENT    Granular Casts, UA PRESENT     Comment: Performed at [facility], [place] [address], [place],  [address]   Ct Renal Stone Study  Result Date: 02/20/2019 CLINICAL DATA:  68 year old female with right flank pain. Concern for kidney stone. EXAM: CT ABDOMEN AND PELVIS WITHOUT CONTRAST TECHNIQUE: Multidetector CT imaging of the abdomen and pelvis was performed following the standard protocol without IV contrast. COMPARISON:  Renal ultrasound dated 09/03/2012 FINDINGS: Evaluation of this exam is limited in the absence of intravenous contrast. Lower chest: Left lung base linear atelectasis/scarring. The visualized lung bases are otherwise clear. Multi vessel coronary vascular calcifications noted. No intra-abdominal free air or free fluid. Hepatobiliary: The liver is unremarkable. No intrahepatic biliary ductal dilatation with cholecystectomy. No retained calcified stone noted in the central CBD. Pancreas: There is moderate fatty infiltration of the pancreas. No active inflammatory changes. Spleen: Normal in size without focal abnormality. Adrenals/Urinary Tract: The adrenal glands are unremarkable. Moderate to severe bilateral renal parenchyma atrophy. 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 There is no hydronephrosis or obstructing stone. A 2 mm nonobstructing  stone versus vascular calcification in the upper pole of the right kidney. There is minimal haziness of the right renal sinus fat and parapelvic fat. The urinary bladder is minimally distended. There is diffuse thickened appearance of the bladder wall with mild perivesical haziness which may represent cystitis. Correlation with urinalysis recommended. Stomach/Bowel: There is a small hiatal hernia. There is colonic diverticulosis without active inflammatory changes. There is no bowel obstruction or active inflammation. The appendix is not visualized with certainty. No inflammatory changes identified in the right lower quadrant. Vascular/Lymphatic: Moderate aortoiliac atherosclerotic disease. The IVC is unremarkable. No portal venous gas. There is no adenopathy. Reproductive: Hysterectomy. No adnexal masses. Other: Midline vertical anterior pelvic wall incisional scar. There is a moderate midline ventral incisional fat containing hernia noted. No fluid collection. No bowel herniation. Musculoskeletal: Osteopenia with degenerative changes of the lower lumbar spine. No acute osseous pathology. IMPRESSION: 1. No hydronephrosis or obstructing stone. A 2 mm nonobstructing right renal upper pole stone versus vascular calcification. 2. Moderate bilateral renal parenchyma atrophy. There is findings  concerning for UTI and possible right pyelonephritis. Correlation with urinalysis recommended. No fluid collection or abscess. 3. Colonic diverticulosis. No bowel obstruction or active inflammation. Aortic Atherosclerosis (ICD10-I70.0). Electronically Signed   By: Anner Crete M.D.   On: 02/20/2019 20:33    Pending Labs Unresulted Labs (From admission, onward)    Start     Ordered   02/21/19 0500  HIV Antibody (routine testing w rflx)  (HIV Antibody (Routine testing w reflex) panel)  Tomorrow morning,   R     02/20/19 2203   02/21/19 0500  Hemoglobin A1c  Tomorrow morning,   R    Comments: To assess prior glycemic  control    02/20/19 2203   02/21/19 7846  Basic metabolic panel  Tomorrow morning,   R     02/20/19 2203   02/21/19 0500  CBC WITH DIFFERENTIAL  Tomorrow morning,   R     02/20/19 2203   02/20/19 2204  Sodium, urine, random  Add-on,   AD     02/20/19 2203   02/20/19 2204  Creatinine, urine, random  Add-on,   AD     02/20/19 2203   02/20/19 2204  Ammonia  Add-on,   AD     02/20/19 2203   02/20/19 2204  TSH  Add-on,   AD     02/20/19 2203   02/20/19 2204  Vitamin B12  Add-on,   AD     02/20/19 2203   02/20/19 2204  Folate RBC  Add-on,   AD     02/20/19 2203   02/20/19 2204  RPR  Add-on,   AD     02/20/19 2203   02/20/19 2108  SARS CORONAVIRUS 2 (TAT 6-24 HRS) Nasopharyngeal Nasopharyngeal Swab  (Asymptomatic/Tier 2 Patients Labs)  Once,   STAT    Question Answer Comment  Is this test for diagnosis or screening Screening   Symptomatic for COVID-19 as defined by CDC No   Hospitalized for COVID-19 No   Admitted to ICU for COVID-19 No   Previously tested for COVID-19 No   Resident in a congregate (group) care setting No   Employed in healthcare setting No   Pregnant No      02/20/19 2107   02/20/19 1723  Urine culture  ONCE - STAT,   STAT     02/20/19 1722          Vitals/Pain Today's Vitals   02/20/19 1900 02/20/19 1919 02/20/19 1930 02/20/19 2203  BP: (!) 130/109 (!) 151/72 (!) 154/137   Pulse:  79 72 76  Resp: 16 13 13 16   Temp:      TempSrc:      SpO2:  96% 99% 96%    Isolation Precautions No active isolations  Medications Medications  heparin injection 5,000 Units (has no administration in time range)  sodium chloride flush (NS) 0.9 % injection 3 mL (has no administration in time range)  acetaminophen (TYLENOL) tablet 650 mg (has no administration in time range)    Or  acetaminophen (TYLENOL) suppository 650 mg (has no administration in time range)  ondansetron (ZOFRAN) tablet 4 mg (has no administration in time range)    Or  ondansetron (ZOFRAN)  injection 4 mg (has no administration in time range)  insulin glargine (LANTUS) injection 30 Units (has no administration in time range)  insulin aspart (novoLOG) injection 0-9 Units (has no administration in time range)  insulin aspart (novoLOG) injection 0-5 Units (has no administration in time range)  cefTRIAXone (  ROCEPHIN) 1 g in sodium chloride 0.9 % 100 mL IVPB (has no administration in time range)  amLODipine (NORVASC) tablet 10 mg (has no administration in time range)  isosorbide mononitrate (IMDUR) 24 hr tablet 30 mg (has no administration in time range)  pravastatin (PRAVACHOL) tablet 10 mg (has no administration in time range)  metoprolol tartrate (LOPRESSOR) tablet 50 mg (has no administration in time range)  0.9 %  sodium chloride infusion (has no administration in time range)  cefTRIAXone (ROCEPHIN) 1 g in sodium chloride 0.9 % 100 mL IVPB (0 g Intravenous Stopped 02/20/19 2148)    Mobility non-ambulatory Moderate fall risk   Focused Assessments Cardiac Assessment Handoff:  Cardiac Rhythm: Normal sinus rhythm Lab Results  Component Value Date   CKTOTAL 80 07/18/2010   CKMB 1.3 07/18/2010   TROPONINI 0.02        NO INDICATION OF MYOCARDIAL INJURY. 09/28/2008   No results found for: DDIMER Does the Patient currently have chest pain? No     R Recommendations: See Admitting Provider Note  Report given to:   Additional Notes: -

## 2019-02-20 NOTE — ED Provider Notes (Addendum)
[facility] Provider Note   CSN: [id number] Arrival date & time: 02/20/19  1709     History   Chief Complaint No chief complaint on file.   HPI Ann [name] is a 68 y.o. female.     68 y.o female with a PMH of Asthma, CAD, Hyperlipidemia presents to the ED via EMS with a chief complaint of hyperglycemia. According to family members patient had some urinary symptoms which began yesterday, she was complaining of some right flank pain with radiation onto her abdomen.  According to family there was also some confusion, states patient was unaware of where she was.  Unable to obtain further history from patient as she is very confused.  The history is provided by the patient.    Past Medical History:  Diagnosis Date   Anemia 09/29/2008   anemia of chronic disease   Anginal pain (HCC)    no recent chest pain   Asthma 09/29/2008   CAD (coronary artery disease) 09/29/2008   hx  PCI - [facility]. Stress test 10/2011 nl with EF 58%. [facility] card., last cath 2010   Chest pain 02/16/2009   Chronic kidney disease (CKD), stage III (moderate) (HCC) 10/20/2009   GERD (gastroesophageal reflux disease)    Hyperlipidemia 10/21/2009   Hypertension 09/29/2008   Insulin dependent diabetes mellitus type IA ([place]) 09/29/2008   Lumbar back pain 02/16/2009   states pain goes down right leg   Myalgia 02/16/2009   Shortness of breath    with exerction   Shoulder pain, left 03/16/2009   occurs at night   Sleep apnea    CPAP    Patient Active Problem List   Diagnosis Date Noted   Pyelonephritis 02/20/2019   Morbid obesity ([place]) 01/28/2013   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   Health care maintenance 03/25/2012   Acute blood loss anemia 12/01/2011    Class: Acute   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    Class: Chronic   Plantar fasciitis of right foot 09/28/2011   Breast  pain 03/08/2011   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   Knee pain, right 01/20/2011   Thoracic spine pain 12/13/2010   DYSLIPIDEMIA 10/21/2009   CHRONIC KIDNEY DISEASE STAGE III (MODERATE) 10/20/2009   UNSPECIFIED ANEMIA 09/29/2008   HYPERTENSION 09/29/2008   Coronary atherosclerosis 09/29/2008   ASTHMA, UNSPECIFIED, UNSPECIFIED STATUS 09/29/2008    Past Surgical History:  Procedure Laterality Date   ABDOMINAL HYSTERECTOMY     APPENDECTOMY     CARDIAC CATHETERIZATION  2008   in [place], [place] stable   CARDIAC CATHETERIZATION  2010   patent stents in mid and distal RCA; high grade disease in prox & mid AV groove LCX& OM; mod disease in prox & mid LAD normal LV FUNCTION--TREATED MEDICALLY     CATARACT EXTRACTION, BILATERAL     CHOLECYSTECTOMY     CORONARY ANGIOPLASTY WITH STENT PLACEMENT  2005   2 vessel PCI at [facility].   DOPPLER ECHOCARDIOGRAPHY  1998   EF 60%   TOTAL KNEE ARTHROPLASTY  12/2011   Left   TOTAL KNEE ARTHROPLASTY  11/28/2011   Procedure: TOTAL KNEE ARTHROPLASTY;  Surgeon: Hessie Dibble, MD;  Location: [facility];  Service: Orthopedics;  Laterality: Right;     OB History   No obstetric history on file.      Home Medications    Prior to Admission medications   Medication Sig Start Date End Date  Taking? Authorizing Provider  ACCU-CHEK FASTCLIX LANCETS MISC USE AS DIRECTED TO CHECK BLOOD SUGAR FOUR TIMES A DAY(BEFORE MEALS AND AT BEDTIME). Patient not taking: Reported on 08/06/2016 10/14/12   Sid Falcon, MD  ACCU-CHEK SMARTVIEW test strip CHECK BLOOD SUGAR BEFORE MEALS AND AT BEDTIME Patient not taking: Reported on 08/06/2016 10/14/12   Sid Falcon, MD  amLODipine (NORVASC) 10 MG tablet TAKE 1 TABLET BY MOUTH DAILY 02/01/12   Neta Ehlers, MD  cetirizine (ZYRTEC) 10 MG chewable tablet Chew 1 tablet (10 mg total) by mouth daily. Patient taking differently: Chew 10 mg by mouth daily as needed for allergies.  01/17/12   Ho, Carylon Perches, MD   cetirizine-pseudoephedrine (ZYRTEC-D) 5-120 MG tablet Take 1 tablet by mouth daily. 08/22/17   Wurst, [place], PA-C  diphenhydrAMINE (BENADRYL) 25 MG tablet Take 1 tablet (25 mg total) by mouth at bedtime as needed for itching. 08/22/17   Wurst, [place], PA-C  ergocalciferol (VITAMIN D2) 50000 units capsule Take 50,000 Units by mouth once a week.    [provider]  fluticasone (FLONASE) 50 MCG/ACT nasal spray INHALE 1 SPRAY IN EACH NOSTRIL EVERY DAY Patient taking differently: INHALE 2 SPRAY IN EACH NOSTRIL EVERY DAY AS NEEDED FOR CONGESTION 05/24/12   Neta Ehlers, MD  gabapentin (NEURONTIN) 100 MG capsule Take 100 mg by mouth 3 (three) times daily.    [provider]  guaiFENesin (MUCINEX) 600 MG 12 hr tablet Take 1 tablet (600 mg total) by mouth 2 (two) times daily. Patient not taking: Reported on 01/05/2015 04/05/14   Dahlia Bailiff, PA-C  insulin aspart (NOVOLOG FLEXPEN) 100 UNIT/ML injection Inject 3 units before each meal Patient taking differently: Inject 5 Units into the skin 3 (three) times daily with meals.  01/17/12   Ho, Carylon Perches, MD  insulin detemir (LEVEMIR) 100 UNIT/ML injection Inject 70 Units into the skin at bedtime.    [provider]  Insulin Pen Needle (EASY TOUCH PEN NEEDLES) 31G X 8 MM MISC Use to inject insulin as instructed. Patient not taking: Reported on 08/06/2016 02/02/12   Bartholomew Crews, MD  isosorbide mononitrate (IMDUR) 30 MG 24 hr tablet TAKE 1 TABLET BY MOUTH ONCE DAILY Patient not taking: Reported on 08/06/2016 11/04/14   Lorretta Harp, MD  linagliptin (TRADJENTA) 5 MG TABS tablet Take 5 mg by mouth daily.    [provider]  lisinopril-hydrochlorothiazide (PRINZIDE,ZESTORETIC) 20-12.5 MG per tablet TAKE 2 TABLETS BY MOUTH EVERY DAY 02/05/12   Dominic Pea, DO  metoprolol (LOPRESSOR) 50 MG tablet Take 50 mg by mouth 2 (two) times daily.  02/05/12   Dominic Pea, DO  Multiple Vitamins-Iron (DAILY-VITAMIN/IRON) TABS  Take one tablet daily Patient not taking: Reported on 08/06/2016 01/17/12   Ansel Bong, MD  pantoprazole (PROTONIX) 20 MG tablet TAKE 1 TABLET BY MOUTH DAILY 02/05/12   Dominic Pea, DO  pravastatin (PRAVACHOL) 40 MG tablet TAKE ONE TABLET BY MOUTH EVERY EVENING 06/28/12   Neta Ehlers, MD  PROAIR HFA 108 (90 BASE) MCG/ACT inhaler INHALE 1-2 PUFFS BY MOUTH EVERY 4 HOURS AS NEEDED FOR SHORTNESS OF BREATH 02/01/12   Neta Ehlers, MD  tiZANidine (ZANAFLEX) 4 MG tablet Take 4 mg by mouth every 6 (six) hours as needed for muscle spasms.    [provider]    Family History Family History  Problem Relation Age of Onset   Heart attack Father    Heart disease Sister    Heart disease Brother  Diabetes Mother    Hypertension Mother    Heart disease Mother    Heart attack Mother    Stroke Mother    Colon cancer Neg Hx     Social History Social History   Tobacco Use   Smoking status: Never Smoker   Smokeless tobacco: Current User    Types: Snuff   Tobacco comment: using "all my life"  Substance Use Topics   Alcohol use: No    Alcohol/week: 0.0 standard drinks   Drug use: No     Allergies   Codeine and Metronidazole   Review of Systems Review of Systems  Unable to perform ROS: Mental status change  Constitutional: Negative for fever.  HENT: Negative for sinus pressure.   Respiratory: Negative for shortness of breath.   Cardiovascular: Negative for chest pain.  Gastrointestinal: Positive for abdominal pain. Negative for nausea and vomiting.  Genitourinary: Positive for difficulty urinating and flank pain (BL).  Musculoskeletal: Negative for back pain.  Skin: Negative for pallor and wound.  Neurological: Positive for weakness. Negative for numbness.     Physical Exam Updated Vital Signs BP (!) 154/137    Pulse 72    Temp 98.7 F (37.1 C) (Oral)    Resp 13    SpO2 99%   Physical Exam Vitals signs and nursing note reviewed.  HENT:      Head: Normocephalic and atraumatic.     Mouth/Throat:     Mouth: Mucous membranes are moist.  Eyes:     Pupils: Pupils are equal, round, and reactive to light.  Neck:     Musculoskeletal: Normal range of motion and neck supple.  Cardiovascular:     Rate and Rhythm: Normal rate.  Pulmonary:     Effort: Pulmonary effort is normal.  Abdominal:     Palpations: Abdomen is soft.     Tenderness: There is abdominal tenderness. There is right CVA tenderness.     Comments: Significant tenderness to palpation along the right CVA.  Abdomen appears distended, patient is holding onto the epigastric region of her abdomen.  Neurological:     Mental Status: She is alert. She is disoriented.     Comments: AO x1      ED Treatments / Results  Labs (all labs ordered are listed, but only abnormal results are displayed) Labs Reviewed  CBC WITH DIFFERENTIAL/PLATELET - Abnormal; Notable for the following components:      Result Value   RBC 3.60 (*)    Hemoglobin 10.3 (*)    HCT 32.2 (*)    All other components within normal limits  COMPREHENSIVE METABOLIC PANEL - Abnormal; Notable for the following components:   Potassium 5.3 (*)    Glucose, Bld 395 (*)    BUN 34 (*)    Creatinine, Ser 3.38 (*)    Albumin 3.2 (*)    GFR calc non Af Amer 13 (*)    GFR calc Af Amer 15 (*)    All other components within normal limits  URINALYSIS, ROUTINE W REFLEX MICROSCOPIC - Abnormal; Notable for the following components:   APPearance CLOUDY (*)    Glucose, UA >=500 (*)    Hgb urine dipstick SMALL (*)    Protein, ur >=300 (*)    Leukocytes,Ua TRACE (*)    WBC, UA >50 (*)    Bacteria, UA MANY (*)    All other components within normal limits  CBG MONITORING, ED - Abnormal; Notable for the following components:   Glucose-Capillary 347 (*)  All other components within normal limits  URINE CULTURE  SARS CORONAVIRUS 2 (TAT 6-24 HRS)    EKG None  Radiology Ct Renal Stone Study  Result Date:  02/20/2019 CLINICAL DATA:  68 year old female with right flank pain. Concern for kidney stone. EXAM: CT ABDOMEN AND PELVIS WITHOUT CONTRAST TECHNIQUE: Multidetector CT imaging of the abdomen and pelvis was performed following the standard protocol without IV contrast. COMPARISON:  Renal ultrasound dated 09/03/2012 FINDINGS: Evaluation of this exam is limited in the absence of intravenous contrast. Lower chest: Left lung base linear atelectasis/scarring. The visualized lung bases are otherwise clear. Multi vessel coronary vascular calcifications noted. No intra-abdominal free air or free fluid. Hepatobiliary: The liver is unremarkable. No intrahepatic biliary ductal dilatation with cholecystectomy. No retained calcified stone noted in the central CBD. Pancreas: There is moderate fatty infiltration of the pancreas. No active inflammatory changes. Spleen: Normal in size without focal abnormality. Adrenals/Urinary Tract: The adrenal glands are unremarkable. Moderate to severe bilateral renal parenchyma atrophy. 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 There is no hydronephrosis or obstructing stone. A 2 mm nonobstructing stone versus vascular calcification in the upper pole of the right kidney. There is minimal haziness of the right renal sinus fat and parapelvic fat. The urinary bladder is minimally distended. There is diffuse thickened appearance of the bladder wall with mild perivesical haziness which may represent cystitis. Correlation with urinalysis recommended. Stomach/Bowel: There is a small hiatal hernia. There is colonic diverticulosis without active inflammatory changes. There is no bowel obstruction or active inflammation. The appendix is not visualized with certainty. No inflammatory changes identified in the right lower quadrant. Vascular/Lymphatic: Moderate aortoiliac atherosclerotic disease. The IVC is unremarkable. No portal venous gas. There is no adenopathy. Reproductive: Hysterectomy. No  adnexal masses. Other: Midline vertical anterior pelvic wall incisional scar. There is a moderate midline ventral incisional fat containing hernia noted. No fluid collection. No bowel herniation. Musculoskeletal: Osteopenia with degenerative changes of the lower lumbar spine. No acute osseous pathology. IMPRESSION: 1. No hydronephrosis or obstructing stone. A 2 mm nonobstructing right renal upper pole stone versus vascular calcification. 2. Moderate bilateral renal parenchyma atrophy. There is findings concerning for UTI and possible right pyelonephritis. Correlation with urinalysis recommended. No fluid collection or abscess. 3. Colonic diverticulosis. No bowel obstruction or active inflammation. Aortic Atherosclerosis (ICD10-I70.0). Electronically Signed   By: Anner Crete M.D.   On: 02/20/2019 20:33    Procedures Procedures (including critical care time)  Medications Ordered in ED Medications  cefTRIAXone (ROCEPHIN) 1 g in sodium chloride 0.9 % 100 mL IVPB (1 g Intravenous New Bag/Given 02/20/19 2107)     Initial Impression / Assessment and Plan / ED Course  I have reviewed the triage vital signs and the nursing notes.  Pertinent labs & imaging results that were available during my care of the patient were reviewed by me and considered in my medical decision making (see chart for details).  Clinical Course as of Feb 20 2131  Thu Feb 20, 2019  2001 Bacteria, UA(!): MANY [JS]  2002 WBC, UA(!): >50 [JS]  2127 Glucose-Capillary(!): 347 [JS]    Clinical Course User Index [JS] Janeece Fitting, PA-C      Patient with a past medical history of diabetes, CKD presents to the ED with complaints of confusion, UTI which began yesterday according to family member.  Will contact family to obtain collateral information.  During arrival patient is alert and oriented x1.  She is very difficult to understand, does report her  sugars have been running high at home.  CBC without any leukocytosis.  CMP  with worsening creatinine although she does have a known history of CKD this is more of an acute on chronic.  Anion gap is 11, no ketones doubt DKA at this time.Marland Kitchen  LFTs are unremarkable.  Very low suspicion for gallbladder pathology.  UA is positive for greater than 50 white blood cell count, many bacteria, no nitrites.  She does seem somewhat altered suspect this is likely coming from her UTI.  On my exam patient was very tender along the right flank, will obtain CT to rule out any infected stone or hydronephrosis. CT renal showed: 1. No hydronephrosis or obstructing stone. A 2 mm nonobstructing  right renal upper pole stone versus vascular calcification.  2. Moderate bilateral renal parenchyma atrophy. There is findings  concerning for UTI and possible right pyelonephritis. Correlation  with urinalysis recommended. No fluid collection or abscess.  3. Colonic diverticulosis. No bowel obstruction or active  inflammation.    Aortic Atherosclerosis (ICD10-I70.0).      5:52 PM Attempted to obtain collateral information from daughter [name]  [phone]. She reported she is currently not living with mother, states her mother has been complaining of left side hip pain for "a while", confusion began yesterday per niece who lives with patient. Patient is able to carry on conversation, uses cane to ambulate.   Patient has become more alert while in the ED.  Due to her confusion on arrival along with her uncontrolled hyperglycemia will bring her into the hospital to treat her acute on chronic kidney failure along with her pyelonephritis.  Ceftriaxone has been ordered.  COVID-19 testing has been ordered as well will place call for hospitalist admission.   9:28 PM spoke to Dr. Myna Hidalgo and who is agreeable of admitting patient at this time.Family was called by me and update on patient's admission into the hospitalist.   Portions of this note were generated with Dragon dictation software. Dictation errors  may occur despite best attempts at proofreading.  Final Clinical Impressions(s) / ED Diagnoses   Final diagnoses:  Pyelonephritis  Hyperglycemia  Altered mental status, unspecified altered mental status type    ED Discharge Orders    None       Janeece Fitting, PA-C 02/20/19 2129    Janeece Fitting, PA-C 02/20/19 2132    Varney Biles, MD 02/20/19 [phone]

## 2019-02-20 NOTE — ED Notes (Signed)
Provider at bedside will return to collect COVID swab

## 2019-02-20 NOTE — H&P (Signed)
History and Physical    Ann [name]:[id number] DOB: July 21, 1950 DOA: 02/20/2019  PCP: [name], MD   Patient coming from: Home   Chief Complaint: Confusion, right flank pain, dysuria, uncontrolled blood sugar  HPI: Ann [name] is a 68 y.o. female with medical history significant for insulin-dependent diabetes mellitus, chronic kidney disease stage III-IV, chronic back and knee pain, coronary artery disease, and hypertension, now presenting to the emergency department for evaluation of right flank pain, dysuria, confusion, and uncontrolled blood glucose.  The patient's family noted that she seemed to be confused last night, this persisted today, and she has been complaining of right flank pain.  Patient reports that she has had burning discomfort with urination for roughly 2 weeks, and developed severe right flank pain over the past couple days.  She reports shaking chills today.  She denies any recent fall or trauma.  She is aware that her family is concerned she is confused.  She denies any headache, change in vision or hearing, or focal numbness or weakness.  She denies any recent changes in her medications.  She knows her name, location, current situation, but has trouble identifying the month or year.  ED Course: Upon arrival to the ED, patient is found to be afebrile, saturating well on room air, and with stable blood pressure.  EKG features a sinus rhythm.  Chemistry panel is notable for glucose of 395 with normal bicarbonate and anion gap, creatinine 3.38, up from 2.26 in 2018, and potassium of 5.3.  CBC is notable for a hemoglobin of 10.3.  Urinalysis features glucosuria, proteinuria, many bacteria, trace leukocytes, >50 WBC/hpf.  CT of the abdomen and pelvis is negative for hydronephrosis or obstructing stone, but notable for 2 mm nonobstructing right renal calculus and moderate bilateral renal parenchymal atrophy with findings concerning for UTI and possible right-sided  pyelonephritis.  Urine culture was collected in the emergency department, patient was treated with Rocephin, and coronavirus testing was ordered but not yet collected.  Review of Systems:  All other systems reviewed and apart from HPI, are negative.  Past Medical History:  Diagnosis Date  . Anemia 09/29/2008   anemia of chronic disease  . Anginal pain ([place])    no recent chest pain  . Asthma 09/29/2008  . CAD (coronary artery disease) 09/29/2008   hx  PCI - [facility]. Stress test 10/2011 nl with EF 58%. [facility] card., last cath 2010  . Chest pain 02/16/2009  . Chronic kidney disease (CKD), stage III (moderate) 10/20/2009  . GERD (gastroesophageal reflux disease)   . Hyperlipidemia 10/21/2009  . Hypertension 09/29/2008  . Insulin dependent diabetes mellitus type IA ([place]) 09/29/2008  . Lumbar back pain 02/16/2009   states pain goes down right leg  . Myalgia 02/16/2009  . Shortness of breath    with exerction  . Shoulder pain, left 03/16/2009   occurs at night  . Sleep apnea    CPAP    Past Surgical History:  Procedure Laterality Date  . ABDOMINAL HYSTERECTOMY    . APPENDECTOMY    . CARDIAC CATHETERIZATION  2008   in [place], [place] stable  . CARDIAC CATHETERIZATION  2010   patent stents in mid and distal RCA; high grade disease in prox & mid AV groove LCX& OM; mod disease in prox & mid LAD normal LV FUNCTION--TREATED MEDICALLY    . CATARACT EXTRACTION, BILATERAL    . CHOLECYSTECTOMY    . CORONARY ANGIOPLASTY WITH STENT PLACEMENT  2005  2 vessel PCI at [facility]  [place]   EF 60%  . TOTAL KNEE ARTHROPLASTY  12/2011   Left  . TOTAL KNEE ARTHROPLASTY  11/28/2011   Procedure: TOTAL KNEE ARTHROPLASTY;  Surgeon: Hessie Dibble, MD;  Location: [facility];  Service: Orthopedics;  Laterality: Right;     reports that she has never smoked. Her smokeless tobacco use includes snuff. She reports that she does not drink alcohol or use drugs.  Allergies   Allergen Reactions  . Codeine Other (See Comments)    REACTION: nausea and vomiting  . Metronidazole Itching    Family History  Problem Relation Age of Onset  . Heart attack Father   . Heart disease Sister   . Heart disease Brother   . Diabetes Mother   . Hypertension Mother   . Heart disease Mother   . Heart attack Mother   . Stroke Mother   . Colon cancer Neg Hx      Prior to Admission medications   Medication Sig Start Date End Date Taking? Authorizing Provider  ACCU-CHEK FASTCLIX LANCETS MISC USE AS DIRECTED TO CHECK BLOOD SUGAR FOUR TIMES A DAY(BEFORE MEALS AND AT BEDTIME). Patient not taking: Reported on 08/06/2016 10/14/12   Sid Falcon, MD  ACCU-CHEK SMARTVIEW test strip CHECK BLOOD SUGAR BEFORE MEALS AND AT BEDTIME Patient not taking: Reported on 08/06/2016 10/14/12   Sid Falcon, MD  amLODipine (NORVASC) 10 MG tablet TAKE 1 TABLET BY MOUTH DAILY 02/01/12   Neta Ehlers, MD  cetirizine (ZYRTEC) 10 MG chewable tablet Chew 1 tablet (10 mg total) by mouth daily. Patient taking differently: Chew 10 mg by mouth daily as needed for allergies.  01/17/12   Ho, Carylon Perches, MD  cetirizine-pseudoephedrine (ZYRTEC-D) 5-120 MG tablet Take 1 tablet by mouth daily. 08/22/17   Wurst, [place], PA-C  diphenhydrAMINE (BENADRYL) 25 MG tablet Take 1 tablet (25 mg total) by mouth at bedtime as needed for itching. 08/22/17   Wurst, [place], PA-C  ergocalciferol (VITAMIN D2) 50000 units capsule Take 50,000 Units by mouth once a week.    [provider]  fluticasone (FLONASE) 50 MCG/ACT nasal spray INHALE 1 SPRAY IN EACH NOSTRIL EVERY DAY Patient taking differently: INHALE 2 SPRAY IN EACH NOSTRIL EVERY DAY AS NEEDED FOR CONGESTION 05/24/12   Neta Ehlers, MD  gabapentin (NEURONTIN) 100 MG capsule Take 100 mg by mouth 3 (three) times daily.    [provider]  guaiFENesin (MUCINEX) 600 MG 12 hr tablet Take 1 tablet (600 mg total) by mouth 2 (two) times daily. Patient not  taking: Reported on 01/05/2015 04/05/14   Dahlia Bailiff, PA-C  insulin aspart (NOVOLOG FLEXPEN) 100 UNIT/ML injection Inject 3 units before each meal Patient taking differently: Inject 5 Units into the skin 3 (three) times daily with meals.  01/17/12   Ho, Carylon Perches, MD  insulin detemir (LEVEMIR) 100 UNIT/ML injection Inject 70 Units into the skin at bedtime.    [provider]  Insulin Pen Needle (EASY TOUCH PEN NEEDLES) 31G X 8 MM MISC Use to inject insulin as instructed. Patient not taking: Reported on 08/06/2016 02/02/12   Bartholomew Crews, MD  isosorbide mononitrate (IMDUR) 30 MG 24 hr tablet TAKE 1 TABLET BY MOUTH ONCE DAILY Patient not taking: Reported on 08/06/2016 11/04/14   Lorretta Harp, MD  linagliptin (TRADJENTA) 5 MG TABS tablet Take 5 mg by mouth daily.    [provider]  lisinopril-hydrochlorothiazide ([place]) 20-12.5  MG per tablet TAKE 2 TABLETS BY MOUTH EVERY DAY 02/05/12   Dominic Pea, DO  metoprolol (LOPRESSOR) 50 MG tablet Take 50 mg by mouth 2 (two) times daily.  02/05/12   Dominic Pea, DO  Multiple Vitamins-Iron (DAILY-VITAMIN/IRON) TABS Take one tablet daily Patient not taking: Reported on 08/06/2016 01/17/12   Ansel Bong, MD  pantoprazole (PROTONIX) 20 MG tablet TAKE 1 TABLET BY MOUTH DAILY 02/05/12   Dominic Pea, DO  pravastatin (PRAVACHOL) 40 MG tablet TAKE ONE TABLET BY MOUTH EVERY EVENING 06/28/12   Neta Ehlers, MD  PROAIR HFA 108 (90 BASE) MCG/ACT inhaler INHALE 1-2 PUFFS BY MOUTH EVERY 4 HOURS AS NEEDED FOR SHORTNESS OF BREATH 02/01/12   Neta Ehlers, MD  tiZANidine (ZANAFLEX) 4 MG tablet Take 4 mg by mouth every 6 (six) hours as needed for muscle spasms.    [provider]    Physical Exam: Vitals:   02/20/19 1816 02/20/19 1900 02/20/19 1919 02/20/19 1930  BP: 129/79 (!) 130/109 (!) 151/72 (!) 154/137  Pulse: 80  79 72  Resp: 15 16 13 13   Temp:      TempSrc:      SpO2: 96%  96% 99%     Constitutional: NAD, calm  Eyes: PERTLA, lids and conjunctivae normal ENMT: Mucous membranes are moist. Posterior pharynx clear of any exudate or lesions.   Neck: normal, supple, no masses, no thyromegaly Respiratory: no wheezing, no crackles. Normal respiratory effort. No accessory muscle use.  Cardiovascular: S1 & S2 heard, regular rate and rhythm. No extremity edema.   Abdomen: No distension, soft. Right CVA tender. Bowel sounds normal.  Musculoskeletal: no clubbing / cyanosis. No joint deformity upper and lower extremities..  Skin: no significant rashes, lesions, ulcers. Warm, dry, well-perfused. Neurologic: CN 2-12 grossly intact. Sensation intact. Strength 5/5 in all 4 limbs.  Psychiatric: Alert and oriented to person and place, not oriented to month or year. Pleasant, cooperative.     Labs on Admission: I have personally reviewed following labs and imaging studies  CBC: Recent Labs  Lab 02/20/19 1800  WBC 7.6  NEUTROABS 5.3  HGB 10.3*  HCT 32.2*  MCV 89.4  PLT 616   Basic Metabolic Panel: Recent Labs  Lab 02/20/19 1800  NA 136  K 5.3*  CL 103  CO2 22  GLUCOSE 395*  BUN 34*  CREATININE 3.38*  CALCIUM 9.5   GFR: CrCl cannot be calculated (Unknown ideal weight.). Liver Function Tests: Recent Labs  Lab 02/20/19 1800  AST 16  ALT 13  ALKPHOS 61  BILITOT 0.4  PROT 6.7  ALBUMIN 3.2*   No results for input(s): LIPASE, AMYLASE in the last 168 hours. No results for input(s): AMMONIA in the last 168 hours. Coagulation Profile: No results for input(s): INR, PROTIME in the last 168 hours. Cardiac Enzymes: No results for input(s): CKTOTAL, CKMB, CKMBINDEX, TROPONINI in the last 168 hours. BNP (last 3 results) No results for input(s): PROBNP in the last 8760 hours. HbA1C: No results for input(s): HGBA1C in the last 72 hours. CBG: Recent Labs  Lab 02/20/19 1714  GLUCAP 347*   Lipid Profile: No results for input(s): CHOL, HDL, LDLCALC, TRIG, CHOLHDL,  LDLDIRECT in the last 72 hours. Thyroid Function Tests: No results for input(s): TSH, T4TOTAL, FREET4, T3FREE, THYROIDAB in the last 72 hours. Anemia Panel: No results for input(s): VITAMINB12, FOLATE, FERRITIN, TIBC, IRON, RETICCTPCT in the last 72 hours. Urine analysis:    Component Value Date/Time   COLORURINE YELLOW  02/20/2019 1900   APPEARANCEUR CLOUDY (A) 02/20/2019 1900   LABSPEC 1.013 02/20/2019 1900   PHURINE 5.0 02/20/2019 1900   GLUCOSEU >=500 (A) 02/20/2019 1900   HGBUR SMALL (A) 02/20/2019 1900   BILIRUBINUR NEGATIVE 02/20/2019 1900   KETONESUR NEGATIVE 02/20/2019 1900   PROTEINUR >=300 (A) 02/20/2019 1900   UROBILINOGEN 0.2 11/21/2011 1213   NITRITE NEGATIVE 02/20/2019 1900   LEUKOCYTESUR TRACE (A) 02/20/2019 1900   Sepsis Labs: @LABRCNTIP (procalcitonin:4,lacticidven:4) )No results found for this or any previous visit (from the past 240 hour(s)).   Radiological Exams on Admission: Ct Renal Stone Study  Result Date: 02/20/2019 CLINICAL DATA:  68 year old female with right flank pain. Concern for kidney stone. EXAM: CT ABDOMEN AND PELVIS WITHOUT CONTRAST TECHNIQUE: Multidetector CT imaging of the abdomen and pelvis was performed following the standard protocol without IV contrast. COMPARISON:  Renal ultrasound dated 09/03/2012 FINDINGS: Evaluation of this exam is limited in the absence of intravenous contrast. Lower chest: Left lung base linear atelectasis/scarring. The visualized lung bases are otherwise clear. Multi vessel coronary vascular calcifications noted. No intra-abdominal free air or free fluid. Hepatobiliary: The liver is unremarkable. No intrahepatic biliary ductal dilatation with cholecystectomy. No retained calcified stone noted in the central CBD. Pancreas: There is moderate fatty infiltration of the pancreas. No active inflammatory changes. Spleen: Normal in size without focal abnormality. Adrenals/Urinary Tract: The adrenal glands are unremarkable. Moderate  to severe bilateral renal parenchyma atrophy. 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 There is no hydronephrosis or obstructing stone. A 2 mm nonobstructing stone versus vascular calcification in the upper pole of the right kidney. There is minimal haziness of the right renal sinus fat and parapelvic fat. The urinary bladder is minimally distended. There is diffuse thickened appearance of the bladder wall with mild perivesical haziness which may represent cystitis. Correlation with urinalysis recommended. Stomach/Bowel: There is a small hiatal hernia. There is colonic diverticulosis without active inflammatory changes. There is no bowel obstruction or active inflammation. The appendix is not visualized with certainty. No inflammatory changes identified in the right lower quadrant. Vascular/Lymphatic: Moderate aortoiliac atherosclerotic disease. The IVC is unremarkable. No portal venous gas. There is no adenopathy. Reproductive: Hysterectomy. No adnexal masses. Other: Midline vertical anterior pelvic wall incisional scar. There is a moderate midline ventral incisional fat containing hernia noted. No fluid collection. No bowel herniation. Musculoskeletal: Osteopenia with degenerative changes of the lower lumbar spine. No acute osseous pathology. IMPRESSION: 1. No hydronephrosis or obstructing stone. A 2 mm nonobstructing right renal upper pole stone versus vascular calcification. 2. Moderate bilateral renal parenchyma atrophy. There is findings concerning for UTI and possible right pyelonephritis. Correlation with urinalysis recommended. No fluid collection or abscess. 3. Colonic diverticulosis. No bowel obstruction or active inflammation. Aortic Atherosclerosis (ICD10-I70.0). Electronically Signed   By: Anner Crete M.D.   On: 02/20/2019 20:33    EKG: Independently reviewed. Sinus rhythm, rate 79, QTc 478 ms.   Assessment/Plan   1. Pyelonephritis   - Presents with >1 wk dysuria, 1-2 days right  flank pain and confusion, and now shaking chills  - There is no fever or leukocytosis but history, exam, CT abd/pelvis findings, and UA all compatible with pyelonephritis  - No recent microbiology data in chart  - Urine culture collected in ED and empiric Rocephin started  - Continue [place], follow culture and clinical course    2. Acute encephalopathy  - Patient's family noted confusion beginning the evening of 10/28 without preceding fall or trauma  - No acute  neurologic deficits on exam  - Multiple potential etiologies, most likely secondary to UTI, medications could be contributing particularly with increased creatinine, and CVA less likely  - Hold medications that could be contributing, continue to treat UTI, check ammonia, B12, folate, TSH, and RPR, continue supportive care, and consider brain imaging if fails to improve as expected    3. Uncontrolled IDDM  - No recent A1c available  - Serum glucose is 395 on admission without DKA  - Monitor CBG's and continue long- and short-acting insulins, update A1c    4. CKD stage IV; ?AKI  - SCr is 3.38 on admission, up from 2.26 in 2018 with no more recent labs available  - Unclear if worsening is acute or chronic  - No hydronephrosis on CT  - Check urine chemistries, hold lisinopril-HCTZ, renally-dose medications, and repeat chem panel in am    5. Hypertension  - BP at goal  - Continue beta-blocker and Norvasc, hold lisinopril-HCTZ while investigating increased creatinine    6. Chronic pain  - Hold Zanaflex in light of AMS    7. CAD  - No anginal complaints  - Continue statin and beta-blocker; no antiplatelet on med list, recommended she discus with PCP    PPE: mask, face shield  DVT prophylaxis: sq heparin  Code Status: Full  Family Communication: Discussed with patient  Consults called: None  Admission status: Observation     Vianne Bulls, MD [facility] Pager [phone]  If 7PM-7AM, please contact  night-coverage [email or web address] Password [facility]  02/20/2019, 9:37 PM

## 2019-02-21 DIAGNOSIS — N10 Acute tubulo-interstitial nephritis: Secondary | ICD-10-CM | POA: Diagnosis present

## 2019-02-21 DIAGNOSIS — I700 Atherosclerosis of aorta: Secondary | ICD-10-CM | POA: Diagnosis present

## 2019-02-21 DIAGNOSIS — T481X5A Adverse effect of skeletal muscle relaxants [neuromuscular blocking agents], initial encounter: Secondary | ICD-10-CM | POA: Diagnosis present

## 2019-02-21 DIAGNOSIS — Z20828 Contact with and (suspected) exposure to other viral communicable diseases: Secondary | ICD-10-CM | POA: Diagnosis present

## 2019-02-21 DIAGNOSIS — N179 Acute kidney failure, unspecified: Secondary | ICD-10-CM | POA: Diagnosis present

## 2019-02-21 DIAGNOSIS — E1122 Type 2 diabetes mellitus with diabetic chronic kidney disease: Secondary | ICD-10-CM | POA: Diagnosis present

## 2019-02-21 DIAGNOSIS — Z6841 Body Mass Index (BMI) 40.0 and over, adult: Secondary | ICD-10-CM | POA: Diagnosis not present

## 2019-02-21 DIAGNOSIS — E785 Hyperlipidemia, unspecified: Secondary | ICD-10-CM | POA: Diagnosis present

## 2019-02-21 DIAGNOSIS — N200 Calculus of kidney: Secondary | ICD-10-CM | POA: Diagnosis present

## 2019-02-21 DIAGNOSIS — E1165 Type 2 diabetes mellitus with hyperglycemia: Secondary | ICD-10-CM | POA: Diagnosis present

## 2019-02-21 DIAGNOSIS — M545 Low back pain: Secondary | ICD-10-CM | POA: Diagnosis present

## 2019-02-21 DIAGNOSIS — I120 Hypertensive chronic kidney disease with stage 5 chronic kidney disease or end stage renal disease: Secondary | ICD-10-CM | POA: Diagnosis present

## 2019-02-21 DIAGNOSIS — T426X5A Adverse effect of other antiepileptic and sedative-hypnotic drugs, initial encounter: Secondary | ICD-10-CM | POA: Diagnosis present

## 2019-02-21 DIAGNOSIS — G4730 Sleep apnea, unspecified: Secondary | ICD-10-CM | POA: Diagnosis present

## 2019-02-21 DIAGNOSIS — F17290 Nicotine dependence, other tobacco product, uncomplicated: Secondary | ICD-10-CM | POA: Diagnosis present

## 2019-02-21 DIAGNOSIS — G8929 Other chronic pain: Secondary | ICD-10-CM | POA: Diagnosis present

## 2019-02-21 DIAGNOSIS — K5730 Diverticulosis of large intestine without perforation or abscess without bleeding: Secondary | ICD-10-CM | POA: Diagnosis present

## 2019-02-21 DIAGNOSIS — D631 Anemia in chronic kidney disease: Secondary | ICD-10-CM | POA: Diagnosis present

## 2019-02-21 DIAGNOSIS — J45909 Unspecified asthma, uncomplicated: Secondary | ICD-10-CM | POA: Diagnosis present

## 2019-02-21 DIAGNOSIS — I2510 Atherosclerotic heart disease of native coronary artery without angina pectoris: Secondary | ICD-10-CM | POA: Diagnosis present

## 2019-02-21 DIAGNOSIS — G92 Toxic encephalopathy: Secondary | ICD-10-CM | POA: Diagnosis present

## 2019-02-21 DIAGNOSIS — N12 Tubulo-interstitial nephritis, not specified as acute or chronic: Secondary | ICD-10-CM | POA: Diagnosis not present

## 2019-02-21 DIAGNOSIS — R4182 Altered mental status, unspecified: Secondary | ICD-10-CM | POA: Diagnosis present

## 2019-02-21 DIAGNOSIS — K5900 Constipation, unspecified: Secondary | ICD-10-CM | POA: Diagnosis not present

## 2019-02-21 DIAGNOSIS — G9340 Encephalopathy, unspecified: Secondary | ICD-10-CM | POA: Diagnosis not present

## 2019-02-21 DIAGNOSIS — N184 Chronic kidney disease, stage 4 (severe): Secondary | ICD-10-CM | POA: Diagnosis present

## 2019-02-21 LAB — URINE CULTURE: Culture: NO GROWTH

## 2019-02-21 LAB — BASIC METABOLIC PANEL
Anion gap: 9 (ref 5–15)
BUN: 33 mg/dL — ABNORMAL HIGH (ref 8–23)
CO2: 22 mmol/L (ref 22–32)
Calcium: 9.3 mg/dL (ref 8.9–10.3)
Chloride: 110 mmol/L (ref 98–111)
Creatinine, Ser: 3.37 mg/dL — ABNORMAL HIGH (ref 0.44–1.00)
GFR calc Af Amer: 15 mL/min — ABNORMAL LOW (ref >60)
GFR calc non Af Amer: 13 mL/min — ABNORMAL LOW (ref >60)
Glucose, Bld: 150 mg/dL — ABNORMAL HIGH (ref 70–99)
Potassium: 4.2 mmol/L (ref 3.5–5.1)
Sodium: 141 mmol/L (ref 135–145)

## 2019-02-21 LAB — SARS CORONAVIRUS 2 (TAT 6-24 HRS): SARS Coronavirus 2: NEGATIVE

## 2019-02-21 LAB — CBC WITH DIFFERENTIAL/PLATELET
Abs Immature Granulocytes: 0.02 10*3/uL (ref 0.00–0.07)
Basophils Absolute: 0 10*3/uL (ref 0.0–0.1)
Basophils Relative: 0 %
Eosinophils Absolute: 0.1 10*3/uL (ref 0.0–0.5)
Eosinophils Relative: 1 %
HCT: 29.6 % — ABNORMAL LOW (ref 36.0–46.0)
Hemoglobin: 9.5 g/dL — ABNORMAL LOW (ref 12.0–15.0)
Immature Granulocytes: 0 %
Lymphocytes Relative: 27 %
Lymphs Abs: 2 10*3/uL (ref 0.7–4.0)
MCH: 28 pg (ref 26.0–34.0)
MCHC: 32.1 g/dL (ref 30.0–36.0)
MCV: 87.3 fL (ref 80.0–100.0)
Monocytes Absolute: 0.7 10*3/uL (ref 0.1–1.0)
Monocytes Relative: 10 %
Neutro Abs: 4.5 10*3/uL (ref 1.7–7.7)
Neutrophils Relative %: 62 %
Platelets: 180 10*3/uL (ref 150–400)
RBC: 3.39 MIL/uL — ABNORMAL LOW (ref 3.87–5.11)
RDW: 13.8 % (ref 11.5–15.5)
WBC: 7.3 10*3/uL (ref 4.0–10.5)
nRBC: 0 % (ref 0.0–0.2)

## 2019-02-21 LAB — GLUCOSE, CAPILLARY
Glucose-Capillary: 107 mg/dL — ABNORMAL HIGH (ref 70–99)
Glucose-Capillary: 114 mg/dL — ABNORMAL HIGH (ref 70–99)
Glucose-Capillary: 126 mg/dL — ABNORMAL HIGH (ref 70–99)
Glucose-Capillary: 181 mg/dL — ABNORMAL HIGH (ref 70–99)
Glucose-Capillary: 301 mg/dL — ABNORMAL HIGH (ref 70–99)

## 2019-02-21 LAB — AMMONIA: Ammonia: 37 umol/L — ABNORMAL HIGH (ref 9–35)

## 2019-02-21 LAB — TSH: TSH: 1.01 u[IU]/mL (ref 0.350–4.500)

## 2019-02-21 LAB — VITAMIN B12: Vitamin B-12: 260 pg/mL (ref 180–914)

## 2019-02-21 LAB — HEMOGLOBIN A1C
Hgb A1c MFr Bld: 8.1 % — ABNORMAL HIGH (ref 4.8–5.6)
Mean Plasma Glucose: 185.77 mg/dL

## 2019-02-21 LAB — HIV ANTIBODY (ROUTINE TESTING W REFLEX): HIV Screen 4th Generation wRfx: NONREACTIVE

## 2019-02-21 MED ORDER — SODIUM CHLORIDE 0.9 % IV SOLN
INTRAVENOUS | Status: DC
  Administered 2019-02-21 – 2019-02-28 (×14): via INTRAVENOUS
  Filled 2019-02-21 (×20): qty 1000

## 2019-02-21 MED ORDER — SODIUM CHLORIDE 0.9 % IV SOLN: INTRAVENOUS | Status: DC

## 2019-02-21 MED ORDER — HYDRALAZINE HCL 20 MG/ML IJ SOLN
10.0000 mg | Freq: Four times a day (QID) | INTRAMUSCULAR | Status: DC | PRN
  Administered 2019-02-23: 10 mg via INTRAVENOUS
  Filled 2019-02-21: qty 1

## 2019-02-21 NOTE — Plan of Care (Signed)
  Problem: Clinical Measurements: Goal: Ability to maintain clinical measurements within normal limits will improve Outcome: Progressing   Problem: Clinical Measurements: Goal: Respiratory complications will improve Outcome: Progressing   

## 2019-02-21 NOTE — Progress Notes (Signed)
PROGRESS NOTE    Lenka Zhao Ziehm  LPF:[id number] DOB: [birth year] DOA: 02/20/2019 PCP: [name], MD   Brief Narrative:  Ann [name] is a 68 y.o. female with medical history significant for insulin-dependent diabetes mellitus, chronic kidney disease stage III-IV, chronic back and knee pain, coronary artery disease, and hypertension, now presenting to the emergency department for evaluation of right flank pain, dysuria, confusion, and uncontrolled blood glucose.  The patient's family noted that she seemed to be confused last night, this persisted today, and she has been complaining of right flank pain.  Patient reports that she has had burning discomfort with urination for roughly 2 weeks, and developed severe right flank pain over the past couple days.  She reports shaking chills today.  She denies any recent fall or trauma.  She is aware that her family is concerned she is confused.  She denies any headache, change in vision or hearing, or focal numbness or weakness.  She denies any recent changes in her medications.  She knows her name, location, current situation, but has trouble identifying the month or year.  ED Course: Upon arrival to the ED, patient is found to be afebrile, saturating well on room air, and with stable blood pressure.  EKG features a sinus rhythm.  Chemistry panel is notable for glucose of 395 with normal bicarbonate and anion gap, creatinine 3.38, up from 2.26 in 2018, and potassium of 5.3.  CBC is notable for a hemoglobin of 10.3.  Urinalysis features glucosuria, proteinuria, many bacteria, trace leukocytes, >50 WBC/hpf.  CT of the abdomen and pelvis is negative for hydronephrosis or obstructing stone, but notable for 2 mm nonobstructing right renal calculus and moderate bilateral renal parenchymal atrophy with findings concerning for UTI and possible right-sided pyelonephritis.  Urine culture was collected in the emergency department, patient was treated with Rocephin, and  coronavirus testing was ordered but not yet collected.  Assessment & Plan:   Principal Problem:   Pyelonephritis Active Problems:   HYPERTENSION   CAD (coronary artery disease)   CKD (chronic kidney disease), stage IV (HCC)   Uncontrolled diabetes mellitus with hyperglycemia (HCC)   Acute encephalopathy   Chronic pain  Right-sided pyelonephritis/nonobstructing right nephrolithiasis: Remains afebrile with no leukocytosis however continues to have right lower quadrant and right flank pain.  Has CVA tenderness as well.  Still confused.  Continue Rocephin and follow culture and tailor antibiotics accordingly.  Acute metabolic encephalopathy: Slightly elevated ammonia which is not impressive enough.  RPR and folate pending and vitamin B12 and TSH within normal limits.  Likely secondary to acute infection/pyelonephritis.  Treat underlying cause.  Type 2 diabetes mellitus with hyperglycemia: She seems to use 70 units of Lantus at home along with oral hypoglycemic agents.  She was started on 30 units of Lantus and SSI.  Blood sugar fluctuating with some hyperglycemia and within normal limits as well.  Continue current management and monitor.  Will hydrate gently and repeat labs in the morning.  CKD stage V vs AKI on CKD stage IV: Last known creatinine was in April 2018 it was 2.26 with GFR of 25.  Presented with creatinine of 3.38 with GFR 15.  Unknown whether this is acute on chronic kidney disease versus baseline chronic kidney disease.  Her diuretic and lisinopril on hold.  Continue current management.  Essential hypertension: Slightly elevated diastolic blood pressure.  Continue amlodipine and Imdur and add as needed hydralazine and monitor for next any 4 hours.   Chronic pain  -  Hold Zanaflex in light of AMS    CAD  - No anginal complaints  - Continue statin and beta-blocker; no antiplatelet on med list, recommended she discus with PCP    DVT prophylaxis: Heparin Code Status: Full  code Family Communication:  None present at bedside.  Disposition Plan: TBD.  Will consult PT OT.  Estimated body mass index is 44.82 kg/m as calculated from the following:   Height as of this encounter: 5\' 3"  (1.6 m).   Weight as of 01/19/15: 114.8 kg.      Nutritional status:               Consultants:   None  Procedures:   None  Antimicrobials:   IV Rocephin   Subjective: Seen and examined.  She seems to be lethargic.  She knew that she was in the hospital but she failed to answer rest of the questions regarding orientation such as name of the hospital, today's month, date, year and current president of the [place].  She also complained of some right flank pain and right lower quadrant abdominal pain.  She clearly was confused.  Objective: Vitals:   02/20/19 2214 02/20/19 2215 02/20/19 2305 02/21/19 0456  BP: (!) 174/94 (!) 136/116 (!) 176/87 (!) 156/84  Pulse: 77  73 77  Resp: 17 12 19 15   Temp:   98.2 F (36.8 C) 98.6 F (37 C)  TempSrc:      SpO2: 97%  99% 100%  Height:   5\' 3"  (1.6 m)     Intake/Output Summary (Last 24 hours) at 02/21/2019 0917 Last data filed at 02/21/2019 0555 Gross per 24 hour  Intake 475.13 ml  Output 200 ml  Net 275.13 ml   There were no vitals filed for this visit.  Examination:  General exam: Appears calm and comfortable, pleasant but confused Respiratory system: Diminished breath sounds at bases. Respiratory effort normal. Cardiovascular system: S1 & S2 heard, RRR. No JVD, murmurs, rubs, gallops or clicks. No pedal edema. Gastrointestinal system: Abdomen is nondistended, soft and nontender. No organomegaly or masses felt. Normal bowel sounds heard.  Right CVA tenderness Central nervous system: Alert and oriented. No focal neurological deficits. Extremities: Symmetric 5 x 5 power. Skin: No rashes, lesions or ulcers Psychiatry: Judgement and insight appear poor, mood & affect flat.   Data Reviewed: I have  personally reviewed following labs and imaging studies  CBC: Recent Labs  Lab 02/20/19 1800 02/21/19 0447  WBC 7.6 7.3  NEUTROABS 5.3 4.5  HGB 10.3* 9.5*  HCT 32.2* 29.6*  MCV 89.4 87.3  PLT 215 142   Basic Metabolic Panel: Recent Labs  Lab 02/20/19 1800 02/21/19 0447  NA 136 141  K 5.3* 4.2  CL 103 110  CO2 22 22  GLUCOSE 395* 150*  BUN 34* 33*  CREATININE 3.38* 3.37*  CALCIUM 9.5 9.3   GFR: CrCl cannot be calculated (Unknown ideal weight.). Liver Function Tests: Recent Labs  Lab 02/20/19 1800  AST 16  ALT 13  ALKPHOS 61  BILITOT 0.4  PROT 6.7  ALBUMIN 3.2*   No results for input(s): LIPASE, AMYLASE in the last 168 hours. Recent Labs  Lab 02/21/19 0148  AMMONIA 37*   Coagulation Profile: No results for input(s): INR, PROTIME in the last 168 hours. Cardiac Enzymes: No results for input(s): CKTOTAL, CKMB, CKMBINDEX, TROPONINI in the last 168 hours. BNP (last 3 results) No results for input(s): PROBNP in the last 8760 hours. HbA1C: Recent Labs  02/21/19 0447  HGBA1C 8.1*   CBG: Recent Labs  Lab 02/20/19 1714 02/21/19 0033 02/21/19 0658  GLUCAP 347* 301* 107*   Lipid Profile: No results for input(s): CHOL, HDL, LDLCALC, TRIG, CHOLHDL, LDLDIRECT in the last 72 hours. Thyroid Function Tests: Recent Labs    02/21/19 0018  TSH 1.010   Anemia Panel: Recent Labs    02/21/19 0018  VITAMINB12 260   Sepsis Labs: No results for input(s): PROCALCITON, LATICACIDVEN in the last 168 hours.  Recent Results (from the past 240 hour(s))  SARS CORONAVIRUS 2 (TAT 6-24 HRS) Nasopharyngeal Nasopharyngeal Swab     Status: None   Collection Time: 02/20/19  9:08 PM   Specimen: Nasopharyngeal Swab  Result Value Ref Range Status   SARS Coronavirus 2 NEGATIVE NEGATIVE Final    Comment: (NOTE) SARS-CoV-2 target nucleic acids are NOT DETECTED. The SARS-CoV-2 RNA is generally detectable in upper and lower respiratory specimens during the acute phase of  infection. Negative results do not preclude SARS-CoV-2 infection, do not rule out co-infections with other pathogens, and should not be used as the sole basis for treatment or other patient management decisions. Negative results must be combined with clinical observations, patient history, and epidemiological information. The expected result is Negative. Fact Sheet for Patients: SugarRoll.be Fact Sheet for Healthcare Providers: https://www.woods-mathews.com/ This test is not yet approved or cleared by the [place] FDA and  has been authorized for detection and/or diagnosis of SARS-CoV-2 by FDA under an Emergency Use Authorization (EUA). This EUA will remain  in effect (meaning this test can be used) for the duration of the COVID-19 declaration under Section 56 4(b)(1) of the Act, 21 U.S.C. section 360bbb-3(b)(1), unless the authorization is terminated or revoked sooner. Performed at [facility], [place] [address], [place], [place] [address]       Radiology Studies: Ct Renal Stone Study  Result Date: 02/20/2019 CLINICAL DATA:  68 year old female with right flank pain. Concern for kidney stone. EXAM: CT ABDOMEN AND PELVIS WITHOUT CONTRAST TECHNIQUE: Multidetector CT imaging of the abdomen and pelvis was performed following the standard protocol without IV contrast. COMPARISON:  Renal ultrasound dated 09/03/2012 FINDINGS: Evaluation of this exam is limited in the absence of intravenous contrast. Lower chest: Left lung base linear atelectasis/scarring. The visualized lung bases are otherwise clear. Multi vessel coronary vascular calcifications noted. No intra-abdominal free air or free fluid. Hepatobiliary: The liver is unremarkable. No intrahepatic biliary ductal dilatation with cholecystectomy. No retained calcified stone noted in the central CBD. Pancreas: There is moderate fatty infiltration of the pancreas. No active inflammatory changes.  Spleen: Normal in size without focal abnormality. Adrenals/Urinary Tract: The adrenal glands are unremarkable. Moderate to severe bilateral renal parenchyma atrophy. 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 There is no hydronephrosis or obstructing stone. A 2 mm nonobstructing stone versus vascular calcification in the upper pole of the right kidney. There is minimal haziness of the right renal sinus fat and parapelvic fat. The urinary bladder is minimally distended. There is diffuse thickened appearance of the bladder wall with mild perivesical haziness which may represent cystitis. Correlation with urinalysis recommended. Stomach/Bowel: There is a small hiatal hernia. There is colonic diverticulosis without active inflammatory changes. There is no bowel obstruction or active inflammation. The appendix is not visualized with certainty. No inflammatory changes identified in the right lower quadrant. Vascular/Lymphatic: Moderate aortoiliac atherosclerotic disease. The IVC is unremarkable. No portal venous gas. There is no adenopathy. Reproductive: Hysterectomy. No adnexal masses. Other: Midline vertical  anterior pelvic wall incisional scar. There is a moderate midline ventral incisional fat containing hernia noted. No fluid collection. No bowel herniation. Musculoskeletal: Osteopenia with degenerative changes of the lower lumbar spine. No acute osseous pathology. IMPRESSION: 1. No hydronephrosis or obstructing stone. A 2 mm nonobstructing right renal upper pole stone versus vascular calcification. 2. Moderate bilateral renal parenchyma atrophy. There is findings concerning for UTI and possible right pyelonephritis. Correlation with urinalysis recommended. No fluid collection or abscess. 3. Colonic diverticulosis. No bowel obstruction or active inflammation. Aortic Atherosclerosis (ICD10-I70.0). Electronically Signed   By: Anner Crete M.D.   On: 02/20/2019 20:33    Scheduled Meds:  amLODipine  10  mg Oral Daily   heparin  5,000 Units Subcutaneous Q8H   insulin aspart  0-5 Units Subcutaneous QHS   insulin aspart  0-9 Units Subcutaneous TID WC   insulin glargine  30 Units Subcutaneous QHS   isosorbide mononitrate  30 mg Oral Daily   metoprolol tartrate  50 mg Oral BID   pravastatin  10 mg Oral q1800   sodium chloride flush  3 mL Intravenous Q12H   Continuous Infusions:  cefTRIAXone (ROCEPHIN)  IV       LOS: 0 days   Time spent: 32 minutes   Darliss Cheney, MD [facility]  02/21/2019, 9:17 AM   To contact the attending provider between 7A-7P or the covering provider during after hours 7P-7A, please log into the web site [email or web address] and use password [email or web address].

## 2019-02-21 NOTE — Evaluation (Signed)
Physical Therapy Evaluation Patient Details Name: Ann [name] MRN: [id number] DOB: February 10, 1951 Today's Date: 02/21/2019   History of Present Illness  Patient is a 75 year olf female admitted with confusion, right side flank pain. PMH to include: IDDM, HTN, CKD, chronic back and knee pain  Clinical Impression  Patient pleasantly confused, received in bed. Unable to provide any information regarding prior functional level or home environment PTA. Difficulty following direction, slow to respond, difficulty processing. Required min assist for bed mobility sit to supine. +2 max assist for scooting up in bed in supine.  Patient will benefit from continued skilled PT to address her functional limitations and determine safe discharge plan.     Follow Up Recommendations SNF;Supervision for mobility/OOB    Equipment Recommendations  Other (comment)(TBD)    Recommendations for Other Services       Precautions / Restrictions Precautions Precautions: Fall Restrictions Weight Bearing Restrictions: No      Mobility  Bed Mobility Overal bed mobility: Needs Assistance Bed Mobility: Supine to Sit;Sit to Supine     Supine to sit: Min guard Sit to supine: Min assist   General bed mobility comments: step by step instructions, slow processing and command following. Assist needed to raise LEs back up onto bed. +2 assist to scoot up in supine.  Transfers                 General transfer comment: unable  Ambulation/Gait             General Gait Details: unable to safely ambulate at this time due to confusion and difficulty following direction  Stairs            Wheelchair Mobility    Modified Rankin (Stroke Patients Only)       Balance Overall balance assessment: Modified Independent                                           Pertinent Vitals/Pain Pain Assessment: Faces Faces Pain Scale: Hurts even more Pain Location: abdomen, right side with  mobility Pain Descriptors / Indicators: Discomfort Pain Intervention(s): Monitored during session;Limited activity within patient's tolerance;Repositioned    Home Living Family/patient expects to be discharged to:: Private residence                 Additional Comments: patient confused and unable to give me home information or independence level PTA    Prior Function                 Hand Dominance        Extremity/Trunk Assessment   Upper Extremity Assessment Upper Extremity Assessment: Overall WFL for tasks assessed    Lower Extremity Assessment Lower Extremity Assessment: Overall WFL for tasks assessed       Communication   Communication: Expressive difficulties(difficulty answering questions, unsure if word finding difficulties or confusion)  Cognition Arousal/Alertness: Awake/alert Behavior During Therapy: WFL for tasks assessed/performed Overall Cognitive Status: No family/caregiver present to determine baseline cognitive functioning Area of Impairment: Orientation;Awareness;Following commands                 Orientation Level: Situation;Time     Following Commands: Follows one step commands inconsistently;Follows one step commands with increased time   Awareness: Emergent          General Comments      Exercises     Assessment/Plan  PT Assessment Patient needs continued PT services  PT Problem List Decreased strength;Decreased mobility;Decreased safety awareness;Decreased activity tolerance;Decreased balance;Pain;Decreased cognition;Obesity       PT Treatment Interventions DME instruction;Therapeutic activities;Gait training;Therapeutic exercise;Functional mobility training;Balance training;Patient/family education;Cognitive remediation    PT Goals (Current goals can be found in the Care Plan section)  Acute Rehab PT Goals Patient Stated Goal: none stated PT Goal Formulation: Patient unable to participate in goal setting     Frequency Min 3X/week   Barriers to discharge        Co-evaluation               AM-PAC PT "6 Clicks" Mobility  Outcome Measure Help needed turning from your back to your side while in a flat bed without using bedrails?: A Little Help needed moving from lying on your back to sitting on the side of a flat bed without using bedrails?: A Little Help needed moving to and from a bed to a chair (including a wheelchair)?: A Lot Help needed standing up from a chair using your arms (e.g., wheelchair or bedside chair)?: A Lot Help needed to walk in hospital room?: Total Help needed climbing 3-5 steps with a railing? : Total 6 Click Score: 12    End of Session   Activity Tolerance: Patient tolerated treatment well;Other (comment);Patient limited by pain(limited by cognitive limitations) Patient left: in bed;with bed alarm set;with call bell/phone within reach Nurse Communication: Mobility status PT Visit Diagnosis: Muscle weakness (generalized) (M62.81);Difficulty in walking, not elsewhere classified (R26.2);Pain;Other abnormalities of gait and mobility (R26.89) Pain - Right/Left: Right Pain - part of body: Hip(abdomen)    Time: 1335-1350 PT Time Calculation (min) (ACUTE ONLY): 15 min   Charges:   PT Evaluation $PT Eval Moderate Complexity: 1 Mod          Modelle Vollmer, PT, GCS 02/21/19,1:56 PM

## 2019-02-21 NOTE — Progress Notes (Signed)
Received from ED via stretcher. See assessment as charted. No family with patient.

## 2019-02-22 LAB — GLUCOSE, CAPILLARY
Glucose-Capillary: 102 mg/dL — ABNORMAL HIGH (ref 70–99)
Glucose-Capillary: 135 mg/dL — ABNORMAL HIGH (ref 70–99)
Glucose-Capillary: 169 mg/dL — ABNORMAL HIGH (ref 70–99)
Glucose-Capillary: 166 mg/dL — ABNORMAL HIGH (ref 70–99)

## 2019-02-22 LAB — RPR: RPR Ser Ql: NONREACTIVE

## 2019-02-22 NOTE — Progress Notes (Signed)
PROGRESS NOTE  Ann [name] Laser NIO:[id number] DOB: 08-31-1950 DOA: 02/20/2019 PCP: [name], MD  Brief History   The patient is a 68 yr old woman who presented to the ED on 02/20/2019 with a chief complaint of right sided flank pain accompanied by dysuria, confusion, shaking chills, and elevated blood glucose. CT of the abdomen in the ED demonstrated right pyelonephritis with no hydronephrosis, but with non-obstructive 28mm right renal calculus and moderate bilateral renal parenchymal atrophy. Urine culture was obtained.   The patient has a past medical history significant for  IDDM, CKD II-IV, chronic back and knee pain, CAD, hypertension.  The patient has been admitted to a medical bed. She has been started on IV Rocephin and IV fluids.  Consultants  . None  Procedures  . None  Antibiotics   Anti-infectives (From admission, onward)   Start     Dose/Rate Route Frequency Ordered Stop   02/21/19 2000  cefTRIAXone (ROCEPHIN) 1 g in sodium chloride 0.9 % 100 mL IVPB     1 g 200 mL/hr over 30 Minutes Intravenous Every 24 hours 02/20/19 2203     02/20/19 2115  cefTRIAXone (ROCEPHIN) 1 g in sodium chloride 0.9 % 100 mL IVPB     1 g 200 mL/hr over 30 Minutes Intravenous  Once 02/20/19 2100 02/20/19 2148    .  Subjective  The patient is resting comfortably. She states that her right flank pain is not a lot better, but that she is feeling a little better.  Objective   Vitals:  Vitals:   02/22/19 0516 02/22/19 0841  BP: (!) 197/79 (!) 183/91  Pulse: 75 72  Resp: 18 18  Temp: 98.6 F (37 C) 98.9 F (37.2 C)  SpO2: 99% 96%   Exam:  Constitutional:  . The patient is awake, alert, and oriented x 3. She becomes very uncomfortable with attempting to sit up or turn to allow auscultation of her lungs from the back due to her right flank pain. Respiratory:  . No increased work of breathing. . No wheezes, rales, or rhonchi . No tactile fremitus Cardiovascular:  . Regular rate  and rhythm . No murmurs, ectopy, or gallups. . No lateral PMI. No thrills. Abdomen:  . Abdomen is soft, non-tender, non-distended . No hernias, masses, or organomegaly . Normoactive bowel sounds.  Musculoskeletal:  . No cyanosis, clubbing, or edema Skin:  . No rashes, lesions, ulcers . palpation of skin: no induration or nodules Neurologic:  . CN 2-12 intact . Sensation all 4 extremities intact Psychiatric:  . Mental status o Mood, affect appropriate o Orientation to person, place, time  . judgment and insight appear intact  I have personally reviewed the following:   Today's Data  . BMP, CBC, Vitals  Micro Data  . Urine cultures pending.  Scheduled Meds: . amLODipine  10 mg Oral Daily  . heparin  5,000 Units Subcutaneous Q8H  . insulin aspart  0-5 Units Subcutaneous QHS  . insulin aspart  0-9 Units Subcutaneous TID WC  . insulin glargine  30 Units Subcutaneous QHS  . isosorbide mononitrate  30 mg Oral Daily  . metoprolol tartrate  50 mg Oral BID  . pravastatin  10 mg Oral q1800  . sodium chloride flush  3 mL Intravenous Q12H   Continuous Infusions: . cefTRIAXone (ROCEPHIN)  IV 1 g (02/21/19 2023)  . 0.9 % sodium chloride with kcl 100 mL/hr at 02/22/19 0844    Principal Problem:   Pyelonephritis Active Problems:  HYPERTENSION   CAD (coronary artery disease)   CKD (chronic kidney disease), stage IV (HCC)   Uncontrolled diabetes mellitus with hyperglycemia (HCC)   Acute encephalopathy   Chronic pain   LOS: 1 day   A & P   Right-sided pyelonephritis/nonobstructing right nephrolithiasis: Remains afebrile with no leukocytosis however continues to have right lower quadrant and right flank pain.  Has CVA tenderness as well.  Still confused. Continue Rocephin and follow culture and tailor antibiotics accordingly.  Acute metabolic encephalopathy: Appears improved. RPR and folate pending and vitamin B12 and TSH within normal limits. Likely secondary to acute  infection/pyelonephritis. Treat underlying cause.  Type 2 diabetes mellitus with hyperglycemia: Glucoses have run from 102 - 181 over the past 24 hours. She seems to use 70 units of Lantus at home along with oral hypoglycemic agents. She was started on 30 units of Lantus and SSI. This appears to be working well.  Continue current management and monitor.   CKD stage V vs AKI on CKD stage IV: Last known creatinine was in April 2018 it was 2.26 with GFR of 25.  Presented with creatinine of 3.38 with GFR 15. This is only mildly improved with IV fluids. Her diuretic and lisinopril on hold. Avoid nephrotoxins and hypotension. Monitor creatinine, electrolytes, and volume status. Continue current management.  Essential hypertension: Slightly elevated diastolic blood pressure.  Continue amlodipine and Imdur and add as needed hydralazine and monitor for next any 4 hours.  Chronic pain: Zanaflex held due to encephalopathy upon admission.  CAD: Noted and stable. Noanginal complaints. Continue statin and beta-blocker. There is no antiplatelet on med list. It has been recommended that she discuss this issue with PCP.   I have seen and examined this patient myself. I have spent 32 minutes in her evaluation and carae.  DVT prophylaxis: Heparin Code Status: Full code Family Communication:  None present at bedside.  Disposition Plan: TBD.  Will consult PT OT. Lori-Ann Lindfors, DO [facility] Direct contact: see [email or web address]  7PM-7AM contact night coverage as above 02/22/2019, 4:00 PM  LOS: 1 day

## 2019-02-22 NOTE — Evaluation (Signed)
Occupational Therapy Evaluation Patient Details Name: Ann [name] MRN: [id number] DOB: 1950-05-26 Today's Date: 02/22/2019    History of Present Illness Patient is a 68 year old female admitted with confusion, right side flank pain. PMH to include: IDDM, HTN, CKD, chronic back and knee pain   Clinical Impression   Patient admitted for above and limited by problem list below, including impaired cognition, decreased activity tolerance, and pain.  Patient oriented to self and place, pleasantly confused, following 1 step commands inconsistently, with poor attention, memory and sequencing.  Patient currently requires max assist to roll and reposition in bed, mod assist for grooming, max to total assist for LB ADLs, transfers deferred due to safety.  Limited eval to bed level.  Patient unable to report PLOF or home setup, but reports yes to living with family.  Patient will benefit from continued OT services while admitted and after dc at SNF level in order to optimize independence with ADLs.     Follow Up Recommendations  SNF;Supervision/Assistance - 24 hour    Equipment Recommendations  Other (comment)(TBD at next venue of care)    Recommendations for Other Services       Precautions / Restrictions Precautions Precautions: Fall Restrictions Weight Bearing Restrictions: No      Mobility Bed Mobility Overal bed mobility: Needs Assistance Bed Mobility: Rolling Rolling: Max assist         General bed mobility comments: max assist to sequence and follow commands to roll in bed towards R and L with max assist; max assist to boost up in bed using [place]   Transfers                 General transfer comment: deferred     Balance                                           ADL either performed or assessed with clinical judgement   ADL Overall ADL's : Needs assistance/impaired     Grooming: Minimal assistance;Bed level Grooming Details (indicate  cue type and reason): able to wash face and hands, but antiipcate increased assist to sequence multiple step tasks  Upper Body Bathing: Moderate assistance;Bed level   Lower Body Bathing: Total assistance;Bed level   Upper Body Dressing : Maximal assistance;Bed level Upper Body Dressing Details (indicate cue type and reason): donning new gown  Lower Body Dressing: Total assistance;Bed level     Toilet Transfer Details (indicate cue type and reason): deferred  Toileting- Clothing Manipulation and Hygiene: Total assistance;Bed level Toileting - Clothing Manipulation Details (indicate cue type and reason): for hygiene and pad change after soiling linen     Functional mobility during ADLs: Maximal assistance;Cueing for sequencing(bed level, limited) General ADL Comments: pt limited by cognition, body habitus, pain, decreased activity tolerance      Vision   Additional Comments: further assessment needed     Perception     Praxis      Pertinent Vitals/Pain Pain Assessment: Faces Faces Pain Scale: Hurts even more Pain Location: abdomen R side with mobility, chest at completion of session Pain Descriptors / Indicators: Discomfort Pain Intervention(s): Limited activity within patient's tolerance;Monitored during session;Repositioned(RN notified )     Hand Dominance Right   Extremity/Trunk Assessment Upper Extremity Assessment Upper Extremity Assessment: Overall WFL for tasks assessed   Lower Extremity Assessment Lower Extremity Assessment: Defer to PT evaluation  Communication Communication Communication: Expressive difficulties(confusion vs word finding difficulties)   Cognition Arousal/Alertness: Awake/alert Behavior During Therapy: Flat affect Overall Cognitive Status: No family/caregiver present to determine baseline cognitive functioning Area of Impairment: Orientation;Following commands;Memory;Awareness;Problem solving                 Orientation Level:  Disoriented to;Situation;Time   Memory: Decreased short-term memory Following Commands: Follows one step commands inconsistently;Follows one step commands with increased time   Awareness: Intellectual Problem Solving: Slow processing;Decreased initiation;Requires verbal cues;Difficulty sequencing;Requires tactile cues General Comments: pt pleasantly confused, oriented to self and place only; following 1 step commands inconsistently and loosing track of thought mid sentence--continue assessment   General Comments       Exercises     Shoulder Instructions      Home Living Family/patient expects to be discharged to:: Private residence                                 Additional Comments: pt unable to provide PLOF, reports living with family       Prior Functioning/Environment          Comments: pt unable to provide PLOF         OT Problem List: Decreased strength;Decreased activity tolerance;Impaired balance (sitting and/or standing);Decreased cognition;Decreased safety awareness;Pain;Obesity      OT Treatment/Interventions: Self-care/ADL training;Balance training;Cognitive remediation/compensation;Patient/family education;DME and/or AE instruction;Therapeutic exercise;Therapeutic activities    OT Goals(Current goals can be found in the care plan section) Acute Rehab OT Goals Patient Stated Goal: none stated OT Goal Formulation: Patient unable to participate in goal setting Time For Goal Achievement: 03/08/19 Potential to Achieve Goals: Fair  OT Frequency: Min 2X/week   Barriers to D/C:            Co-evaluation              AM-PAC OT "6 Clicks" Daily Activity     Outcome Measure Help from another person eating meals?: A Lot Help from another person taking care of personal grooming?: A Little Help from another person toileting, which includes using toliet, bedpan, or urinal?: Total Help from another person bathing (including washing, rinsing,  drying)?: A Lot Help from another person to put on and taking off regular upper body clothing?: A Lot Help from another person to put on and taking off regular lower body clothing?: Total 6 Click Score: 11   End of Session Nurse Communication: Mobility status;Other (comment)(pain)  Activity Tolerance: Patient limited by pain Patient left: in bed;with call bell/phone within reach;with bed alarm set  OT Visit Diagnosis: Other abnormalities of gait and mobility (R26.89);Pain;Other symptoms and signs involving cognitive function Pain - Right/Left: Right Pain - part of body: (abdomen)                Time: 2774-1287 OT Time Calculation (min): 22 min Charges:  OT General Charges $OT Visit: 1 Visit OT Evaluation $OT Eval Moderate Complexity: Branch, OT Acute Rehabilitation Services Pager [phone] Office [phone]   Delight Stare 02/22/2019, 8:55 AM

## 2019-02-23 DIAGNOSIS — R4182 Altered mental status, unspecified: Secondary | ICD-10-CM

## 2019-02-23 LAB — CBC WITH DIFFERENTIAL/PLATELET
Abs Immature Granulocytes: 0.02 10*3/uL (ref 0.00–0.07)
Basophils Absolute: 0 10*3/uL (ref 0.0–0.1)
Basophils Relative: 0 %
Eosinophils Absolute: 0.1 10*3/uL (ref 0.0–0.5)
Eosinophils Relative: 2 %
HCT: 29.8 % — ABNORMAL LOW (ref 36.0–46.0)
Hemoglobin: 9.5 g/dL — ABNORMAL LOW (ref 12.0–15.0)
Immature Granulocytes: 0 %
Lymphocytes Relative: 29 %
Lymphs Abs: 1.6 10*3/uL (ref 0.7–4.0)
MCH: 28.3 pg (ref 26.0–34.0)
MCHC: 31.9 g/dL (ref 30.0–36.0)
MCV: 88.7 fL (ref 80.0–100.0)
Monocytes Absolute: 0.6 10*3/uL (ref 0.1–1.0)
Monocytes Relative: 10 %
Neutro Abs: 3.2 10*3/uL (ref 1.7–7.7)
Neutrophils Relative %: 59 %
Platelets: 157 10*3/uL (ref 150–400)
RBC: 3.36 MIL/uL — ABNORMAL LOW (ref 3.87–5.11)
RDW: 13.5 % (ref 11.5–15.5)
WBC: 5.6 10*3/uL (ref 4.0–10.5)
nRBC: 0 % (ref 0.0–0.2)

## 2019-02-23 LAB — BASIC METABOLIC PANEL
Anion gap: 8 (ref 5–15)
BUN: 22 mg/dL (ref 8–23)
CO2: 20 mmol/L — ABNORMAL LOW (ref 22–32)
Calcium: 8.8 mg/dL — ABNORMAL LOW (ref 8.9–10.3)
Chloride: 112 mmol/L — ABNORMAL HIGH (ref 98–111)
Creatinine, Ser: 2.95 mg/dL — ABNORMAL HIGH (ref 0.44–1.00)
GFR calc Af Amer: 18 mL/min — ABNORMAL LOW (ref >60)
GFR calc non Af Amer: 16 mL/min — ABNORMAL LOW (ref >60)
Glucose, Bld: 106 mg/dL — ABNORMAL HIGH (ref 70–99)
Potassium: 5 mmol/L (ref 3.5–5.1)
Sodium: 140 mmol/L (ref 135–145)

## 2019-02-23 LAB — GLUCOSE, CAPILLARY
Glucose-Capillary: 57 mg/dL — ABNORMAL LOW (ref 70–99)
Glucose-Capillary: 64 mg/dL — ABNORMAL LOW (ref 70–99)
Glucose-Capillary: 89 mg/dL (ref 70–99)
Glucose-Capillary: 97 mg/dL (ref 70–99)
Glucose-Capillary: 94 mg/dL (ref 70–99)
Glucose-Capillary: 91 mg/dL (ref 70–99)

## 2019-02-23 MED ORDER — CYCLOBENZAPRINE HCL 5 MG PO TABS
5.0000 mg | ORAL_TABLET | Freq: Three times a day (TID) | ORAL | Status: DC
  Administered 2019-02-23: 5 mg via ORAL
  Filled 2019-02-23 (×2): qty 1

## 2019-02-23 MED ORDER — LISINOPRIL 20 MG PO TABS
20.0000 mg | ORAL_TABLET | Freq: Every day | ORAL | Status: DC
  Administered 2019-02-23 – 2019-02-28 (×6): 20 mg via ORAL
  Filled 2019-02-23 (×6): qty 1

## 2019-02-23 MED ORDER — INSULIN GLARGINE 100 UNIT/ML ~~LOC~~ SOLN
20.0000 [IU] | Freq: Every day | SUBCUTANEOUS | Status: DC
  Administered 2019-02-23 – 2019-02-26 (×4): 20 [IU] via SUBCUTANEOUS
  Filled 2019-02-23 (×5): qty 0.2

## 2019-02-23 MED ORDER — HYDROCHLOROTHIAZIDE 12.5 MG PO CAPS
12.5000 mg | ORAL_CAPSULE | Freq: Every day | ORAL | Status: DC
  Administered 2019-02-23 – 2019-02-28 (×6): 12.5 mg via ORAL
  Filled 2019-02-23 (×6): qty 1

## 2019-02-23 MED ORDER — GABAPENTIN 100 MG PO CAPS
100.0000 mg | ORAL_CAPSULE | Freq: Three times a day (TID) | ORAL | Status: DC
  Administered 2019-02-23 (×2): 100 mg via ORAL
  Filled 2019-02-23 (×3): qty 1

## 2019-02-23 MED ORDER — LISINOPRIL-HYDROCHLOROTHIAZIDE 20-12.5 MG PO TABS: 1.0000 | ORAL_TABLET | Freq: Every day | ORAL | Status: DC

## 2019-02-23 NOTE — NC FL2 (Signed)
[place] LEVEL OF CARE SCREENING TOOL     IDENTIFICATION  Patient Name: Ann [name] Birthdate: [birth year] Sex: female Admission Date (Current Location): 02/20/2019  [place] and [place] Number:  Ann [name] [id number] [place] and Address:  [facility] [place], [place] [address] [place], [place] [address]      Provider Number: [id number]  Attending Physician Name and Address:  Ann Hillock, MD  Relative Name and Phone Number:  Ann [name], Daughter, [phone]    Current Level of Care: Hospital Recommended Level of Care: [place] Prior Approval Number:    Date Approved/Denied: 02/23/19 PASRR Number: [id number]  Discharge Plan:      Current Diagnoses: Patient Active Problem List   Diagnosis Date Noted  . Pyelonephritis 02/20/2019  . Acute encephalopathy 02/20/2019  . Chronic pain 02/20/2019  . Morbid obesity ([place]) 01/28/2013  . Uncontrolled diabetes mellitus with hyperglycemia ([place]) 03/25/2012  . Health care maintenance 03/25/2012  . Acute blood loss anemia 12/01/2011    Class: Acute  . 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 Plantar fasciitis of right foot 09/28/2011  . Breast pain 03/08/2011  . 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 Knee pain, right 01/20/2011  . Thoracic spine pain 12/13/2010  . DYSLIPIDEMIA 10/21/2009  . CKD (chronic kidney disease), stage IV ([place]) 10/20/2009  . UNSPECIFIED ANEMIA 09/29/2008  . HYPERTENSION 09/29/2008  . CAD (coronary artery disease) 09/29/2008  . ASTHMA, UNSPECIFIED, UNSPECIFIED STATUS 09/29/2008    Orientation RESPIRATION BLADDER Height & Weight     Self, Place  Normal Incontinent, External catheter Weight:   Height:  5\' 3"  (160 cm)  BEHAVIORAL SYMPTOMS/MOOD NEUROLOGICAL BOWEL NUTRITION STATUS      Continent Diet(heart healthy/carb modified diet, thin liquids)  AMBULATORY STATUS COMMUNICATION OF NEEDS Skin   Limited Assist Verbally Skin  abrasions(MASD on abdomen/groin)                       Personal Care Assistance Level of Assistance  Bathing, Feeding, Dressing, Total care Bathing Assistance: Maximum assistance Feeding assistance: Limited assistance Dressing Assistance: Maximum assistance Total Care Assistance: Maximum assistance   Functional Limitations Info  Sight, Hearing, Speech Sight Info: Adequate Hearing Info: Adequate Speech Info: Adequate    SPECIAL CARE FACTORS FREQUENCY  PT (By licensed PT), OT (By licensed OT)     PT Frequency: 5x/wk OT Frequency: 5x/wk            Contractures Contractures Info: Not present    Additional Factors Info  Code Status, Allergies, Insulin Sliding Scale Code Status Info: Full Code Allergies Info: Codeine, Metronidazole   Insulin Sliding Scale Info: insulin aspart novolog 0-9 units 3x daily w/meals; insulin aspart novolog 0-5 units daily at bedtime. insulin glargine (lantus) 30 units daily       Current Medications (02/23/2019):  This is the current hospital active medication list Current Facility-Administered Medications  Medication Dose Route Frequency Provider Last Rate Last Dose  . acetaminophen (TYLENOL) tablet 650 mg  650 mg Oral Q6H PRN Opyd, Ilene Qua, MD       Or  . acetaminophen (TYLENOL) suppository 650 mg  650 mg Rectal Q6H PRN Opyd, Ilene Qua, MD      . amLODipine (NORVASC) tablet 10 mg  10 mg Oral Daily Opyd, Ilene Qua, MD   10 mg at 02/23/19 0916  . cefTRIAXone (ROCEPHIN) 1 g in sodium chloride 0.9 % 100 mL IVPB  1  g Intravenous Q24H Vianne Bulls, MD   Stopped at 02/22/19 2045  . gabapentin (NEURONTIN) capsule 100 mg  100 mg Oral TID Ann Hillock, MD      . heparin injection 5,000 Units  5,000 Units Subcutaneous Q8H Opyd, Ilene Qua, MD   5,000 Units at 02/23/19 0553  . hydrALAZINE (APRESOLINE) injection 10 mg  10 mg Intravenous Q6H PRN Pahwani, Einar Grad, MD      . hydrochlorothiazide (MICROZIDE) capsule 12.5 mg  12.5 mg Oral Daily [place], Marge Duncans, MD      . insulin aspart (novoLOG) injection 0-5 Units  0-5 Units Subcutaneous QHS Vianne Bulls, MD   4 Units at 02/21/19 0051  . insulin aspart (novoLOG) injection 0-9 Units  0-9 Units Subcutaneous TID WC Opyd, Ilene Qua, MD   2 Units at 02/22/19 1734  . insulin glargine (LANTUS) injection 30 Units  30 Units Subcutaneous QHS Vianne Bulls, MD   30 Units at 02/22/19 2151  . isosorbide mononitrate (IMDUR) 24 hr tablet 30 mg  30 mg Oral Daily Opyd, Ilene Qua, MD   30 mg at 02/23/19 0916  . lisinopril (ZESTRIL) tablet 20 mg  20 mg Oral Daily Darrick Meigs, Marge Duncans, MD      . metoprolol tartrate (LOPRESSOR) tablet 50 mg  50 mg Oral BID Vianne Bulls, MD   50 mg at 02/23/19 0917  . ondansetron (ZOFRAN) tablet 4 mg  4 mg Oral Q6H PRN Opyd, Ilene Qua, MD       Or  . ondansetron (ZOFRAN) injection 4 mg  4 mg Intravenous Q6H PRN Opyd, Ilene Qua, MD      . pravastatin (PRAVACHOL) tablet 10 mg  10 mg Oral q1800 Opyd, Ilene Qua, MD   10 mg at 02/22/19 1734  . sodium chloride 0.9 % 1,000 mL with potassium chloride 10 mEq infusion   Intravenous Continuous Skeet Simmer, RPH 100 mL/hr at 02/23/19 4585    . sodium chloride flush (NS) 0.9 % injection 3 mL  3 mL Intravenous Q12H Opyd, Ilene Qua, MD   3 mL at 02/23/19 9292     Discharge Medications: Please see discharge summary for a list of discharge medications.  Relevant Imaging Results:  Relevant Lab Results:   Additional Information SSN: [id number]; COVID Negative on 02/20/2019  Ann [name], LCSWA

## 2019-02-23 NOTE — Progress Notes (Signed)
[facility]  PROGRESS NOTE  Ann [name] IWP:[id number] DOB: 1951/04/19 DOA: 02/20/2019 PCP: [name], MD   Brief HPI:   68 year old female who came to ED with complaints of right-sided flank pain up accompanied by dysuria, confusion, shaking chills and hyperglycemia.  CT read on pelvis showed right pyonephritis with no hydronephrosis also showed 2 mm right renal calculus and moderate bilateral renal parenchymal atrophy.  Urine culture was obtained.  Patient has history of diabetes mellitus type 2, CKD stage II-IV, chronic back pain, CAD, hypertension.    Subjective   Patient seen and examined, still has some right sided flank pain.  Urine culture showed no growth.  Patient was hypoglycemic this morningWith CBG dropping to 57, improved to 89 after she was given juice to drink.   Assessment/Plan:     1. Right flank pain-CT scan showed possibility of right-sided pyelonephritis, with right nephrolithiasis.  Urine culture showed no growth.  Patient is currently on IV Rocephin she still has right CVA/flank tenderness.  I feel that patient symptoms are more from the muscle spasm from chronic back pain.  Will start Flexeril 5 mg 3 times daily.  I called and discussed with neurologist on-call Dr. Tresa Moore, who feels that 2 mm stone should pass of its own.  No need to follow-up with him.  Patient already has an appointment to see her nephrologist in 1 week or so.  2. Metabolic encephalopathy-resolved, B12 and TSH are within normal limits.  RPR is nonreactive, folate RBC is pending.  Not clear about etiology of patient's metabolic encephalopathy.  Will monitor.  3. Diabetes mellitus type 2-patient having episodes of hypoglycemia in the hospital.  Will cut down the dose of Lantus to 20 units subcu daily.  Continue sliding scale insulin with NovoLog.  4. CKD stage V-patient's last known creatinine in April 2018 was 2.26 with GFR 25, patient presented to the ED with creatinine of 3.38 with GFR  15.  Today creatinine has improved to 2.95.  She already has an appointment to see nephrologist in 1 week.  5. Hypertension-blood pressure is elevated, restarted her home medications today.  Hydralazine as needed for SBP greater than 160/100.  6. Chronic back pain-Zanaflex is currently on hold due to encephalopathy on admission.  7. CAD-stable.  No anginal complaints.  Continue statin and beta-blocker.     CBG: Recent Labs  Lab 02/22/19 2058 02/23/19 0717 02/23/19 0749 02/23/19 0821 02/23/19 1132  GLUCAP 166* 57* 64* 89 97    CBC: Recent Labs  Lab 02/20/19 1800 02/21/19 0447 02/23/19 1108  WBC 7.6 7.3 5.6  NEUTROABS 5.3 4.5 3.2  HGB 10.3* 9.5* 9.5*  HCT 32.2* 29.6* 29.8*  MCV 89.4 87.3 88.7  PLT 215 180 505    Basic Metabolic Panel: Recent Labs  Lab 02/20/19 1800 02/21/19 0447 02/23/19 1108  NA 136 141 140  K 5.3* 4.2 5.0  CL 103 110 112*  CO2 22 22 20*  GLUCOSE 395* 150* 106*  BUN 34* 33* 22  CREATININE 3.38* 3.37* 2.95*  CALCIUM 9.5 9.3 8.8*     DVT prophylaxis: Heparin  Code Status: Full code  Family Communication: No family at bedside  Disposition Plan: PT recommends skilled nursing facility.     BMI  Estimated body mass index is 44.82 kg/m as calculated from the following:   Height as of this encounter: 5\' 3"  (1.6 m).   Weight as of 01/19/15: 114.8 kg.  Scheduled medications:  . amLODipine  10 mg Oral Daily  .  gabapentin  100 mg Oral TID  . heparin  5,000 Units Subcutaneous Q8H  . hydrochlorothiazide  12.5 mg Oral Daily  . insulin aspart  0-5 Units Subcutaneous QHS  . insulin aspart  0-9 Units Subcutaneous TID WC  . insulin glargine  30 Units Subcutaneous QHS  . isosorbide mononitrate  30 mg Oral Daily  . lisinopril  20 mg Oral Daily  . metoprolol tartrate  50 mg Oral BID  . pravastatin  10 mg Oral q1800  . sodium chloride flush  3 mL Intravenous Q12H    Consultants:    Procedures:     Antibiotics:   Anti-infectives  (From admission, onward)   Start     Dose/Rate Route Frequency Ordered Stop   02/21/19 2000  cefTRIAXone (ROCEPHIN) 1 g in sodium chloride 0.9 % 100 mL IVPB     1 g 200 mL/hr over 30 Minutes Intravenous Every 24 hours 02/20/19 2203     02/20/19 2115  cefTRIAXone (ROCEPHIN) 1 g in sodium chloride 0.9 % 100 mL IVPB     1 g 200 mL/hr over 30 Minutes Intravenous  Once 02/20/19 2100 02/20/19 2148       Objective   Vitals:   02/22/19 1644 02/22/19 2012 02/23/19 0508 02/23/19 0859  BP: (!) 154/68 (!) 182/64 (!) 171/59 (!) 184/68  Pulse: 68 72 (!) 59 66  Resp: 18 14 18 18   Temp: 98.4 F (36.9 C) 98.6 F (37 C) 98.3 F (36.8 C) 97.7 F (36.5 C)  TempSrc: Oral  Oral Oral  SpO2: 100% 100% 100% 98%  Height:        Intake/Output Summary (Last 24 hours) at 02/23/2019 1245 Last data filed at 02/23/2019 1132 Gross per 24 hour  Intake 580 ml  Output 1050 ml  Net -470 ml   There were no vitals filed for this visit.   Physical Examination:    General: Appears in no acute distress  Cardiovascular: S1-S2, regular, no murmur auscultated  Respiratory: Clear to auscultation bilaterally  Abdomen: Abdomen is soft, nontender, no organomegaly, positive right paraspinal tenderness  Extremities: No edema in the lower extremities  Neurologic: Cranial nerves II to XII grossly intact, motor strength 5/5 in all extremities     Data Reviewed: I have personally reviewed following labs and imaging studies   Recent Results (from the past 240 hour(s))  Urine culture     Status: None   Collection Time: 02/20/19  7:00 PM   Specimen: Urine, Clean Catch  Result Value Ref Range Status   Specimen Description URINE, CLEAN CATCH  Final   Special Requests NONE  Final   Culture   Final    NO GROWTH Performed at [facility], [address], [place], [place] [address]    Report Status 02/21/2019 FINAL  Final  SARS CORONAVIRUS 2 (TAT 6-24 HRS) Nasopharyngeal Nasopharyngeal Swab     Status:  None   Collection Time: 02/20/19  9:08 PM   Specimen: Nasopharyngeal Swab  Result Value Ref Range Status   SARS Coronavirus 2 NEGATIVE NEGATIVE Final    Comment: (NOTE) SARS-CoV-2 target nucleic acids are NOT DETECTED. The SARS-CoV-2 RNA is generally detectable in upper and lower respiratory specimens during the acute phase of infection. Negative results do not preclude SARS-CoV-2 infection, do not rule out co-infections with other pathogens, and should not be used as the sole basis for treatment or other patient management decisions. Negative results must be combined with clinical observations, patient history, and epidemiological information.  The expected result is Negative. Fact Sheet for Patients: SugarRoll.be Fact Sheet for Healthcare Providers: https://www.woods-mathews.com/ This test is not yet approved or cleared by the [place] FDA and  has been authorized for detection and/or diagnosis of SARS-CoV-2 by FDA under an Emergency Use Authorization (EUA). This EUA will remain  in effect (meaning this test can be used) for the duration of the COVID-19 declaration under Section 56 4(b)(1) of the Act, 21 U.S.C. section 360bbb-3(b)(1), unless the authorization is terminated or revoked sooner. Performed at [facility], [place] [address], [place], [place] [address]      Liver Function Tests: Recent Labs  Lab 02/20/19 1800  AST 16  ALT 13  ALKPHOS 61  BILITOT 0.4  PROT 6.7  ALBUMIN 3.2*   No results for input(s): LIPASE, AMYLASE in the last 168 hours. Recent Labs  Lab 02/21/19 0148  AMMONIA 37*      Admission status: Inpatient: Based on patients clinical presentation and evaluation of above clinical data, I have made determination that patient meets Inpatient criteria at this time.  Time spent: 20 min  [facility] Pager [phone]. If 7PM-7AM, please contact night-coverage at  [email or web address], Office  [phone]  password [email or web address]  02/23/2019, 12:45 PM  LOS: 2 days

## 2019-02-23 NOTE — Progress Notes (Signed)
PT Cancellation Note  Patient Details Name: Ann [name] MRN: [id number] DOB: 1950/09/29   Cancelled Treatment:    Reason Eval/Treat Not Completed: Medical issues which prohibited therapy  Attempted to see this morning with BP 184/68. Spoke with Dr. Darrick Meigs and goal is SBP<160. Pt was to get prn BP meds.  On return this pm BP 200/84 with RN notified. PT deferred at this time.    Barry Brunner, PT Pager [phone]  Rexanne Mano 02/23/2019, 4:56 PM

## 2019-02-23 NOTE — TOC Initial Note (Signed)
Transition of Care [facility]) - Initial/Assessment Note    Patient Details  Name: Ann [name] MRN: [id number] Date of Birth: 1951-02-20  Transition of Care [facility]) CM/SW Contact:    Gelene Mink, [place] Phone Number: 02/23/2019, 10:30 AM  Clinical Narrative:                  CSW called and spoke with the patient's daughter, [name] Manasco. CSW introduced herself and explained her role. CSW shared therapy recommendation. [name] is in agreement with her mother going to rehab. She stated that her sister lives with the patient but wants her mother to get stronger. Her preferences are [facility] and [facility]. CSW will leave a CMS SNF List at bedside.   CSW clinicals to [facility] to start insurance authorization. CSW will provide bed offers to the patient's daughter.   CSW will continue to follow and assist with discharge planning.   Expected Discharge Plan: Skilled Nursing Facility Barriers to Discharge: Insurance Authorization, Continued Medical Work up   Patient Goals and CMS Choice Patient states their goals for this hospitalization and ongoing recovery are:: Pt daughter wants her mother to feel better CMS Medicare.gov Compare Post Acute Care list provided to:: Patient Represenative (must comment) Choice offered to / list presented to : Adult Children  Expected Discharge Plan and Services Expected Discharge Plan: [place] In-house Referral: Clinical Social Work Discharge Planning Services: NA Post Acute Care Choice: [place] Living arrangements for the past 2 months: Apartment                 DME Arranged: N/A DME Agency: NA       HH Arranged: NA [place] Agency: NA        Prior Living Arrangements/Services Living arrangements for the past 2 months: [place] with:: Adult Children Patient language and need for interpreter reviewed:: No Do you feel safe going back to the place where you live?: Yes      Need for Family Participation in  Patient Care: No (Comment) Care giver support system in place?: Yes (comment)   Criminal Activity/Legal Involvement Pertinent to Current Situation/Hospitalization: No - Comment as needed  Activities of Daily Living      Permission Sought/Granted Permission sought to share information with : Case Manager Permission granted to share information with : Yes, Verbal Permission Granted  Share Information with NAME: [name]  Permission granted to share info w AGENCY: All SNF  Permission granted to share info w Relationship: Daughter     Emotional Assessment Appearance:: Appears stated age Attitude/Demeanor/Rapport: Unable to Assess Affect (typically observed): Unable to Assess Orientation: : Oriented to Self, Oriented to Place Alcohol / Substance Use: Not Applicable Psych Involvement: No (comment)  Admission diagnosis:  Pyelonephritis [N12] Hyperglycemia [R73.9] Altered mental status, unspecified altered mental status type [R41.82] Patient Active Problem List   Diagnosis Date Noted  . Pyelonephritis 02/20/2019  . Acute encephalopathy 02/20/2019  . Chronic pain 02/20/2019  . Morbid obesity ([place]) 01/28/2013  . Uncontrolled diabetes mellitus with hyperglycemia ([place]) 03/25/2012  . Health care maintenance 03/25/2012  . Acute blood loss anemia 12/01/2011    Class: Acute  . 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 Plantar fasciitis of right foot 09/28/2011  . Breast pain 03/08/2011  . 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 Knee pain, right 01/20/2011  . Thoracic spine pain 12/13/2010  . DYSLIPIDEMIA 10/21/2009  . CKD (chronic kidney disease), stage IV ([place]) 10/20/2009  .  UNSPECIFIED ANEMIA 09/29/2008  . HYPERTENSION 09/29/2008  . CAD (coronary artery disease) 09/29/2008  . ASTHMA, UNSPECIFIED, UNSPECIFIED STATUS 09/29/2008   PCP:  [name], MD Pharmacy:   [place], [facility] [address] [place] [address] Phone: [phone] Fax: [place], [place] [address] [place] [address] Phone: [phone] Fax: [phone]     Social Determinants of Health (SDOH) Interventions    Readmission Risk Interventions Readmission Risk Prevention Plan 02/23/2019  Transportation Screening Complete  PCP or Specialist Appt within 5-7 Days Complete  Home Care Screening Not Complete  Home Care Screening Not Completed Comments NA  Medication Review (RN CM) Not Complete  Med Review comments NA  Some recent data might be hidden

## 2019-02-24 DIAGNOSIS — E1165 Type 2 diabetes mellitus with hyperglycemia: Secondary | ICD-10-CM

## 2019-02-24 DIAGNOSIS — R739 Hyperglycemia, unspecified: Secondary | ICD-10-CM

## 2019-02-24 LAB — FOLATE RBC
Folate, Hemolysate: 294 ng/mL
Folate, RBC: 942 ng/mL (ref >498)
Hematocrit: 31.2 % — ABNORMAL LOW (ref 34.0–46.6)

## 2019-02-24 LAB — GLUCOSE, CAPILLARY
Glucose-Capillary: 83 mg/dL (ref 70–99)
Glucose-Capillary: 109 mg/dL — ABNORMAL HIGH (ref 70–99)
Glucose-Capillary: 163 mg/dL — ABNORMAL HIGH (ref 70–99)
Glucose-Capillary: 165 mg/dL — ABNORMAL HIGH (ref 70–99)

## 2019-02-24 MED ORDER — GABAPENTIN 100 MG PO CAPS
100.0000 mg | ORAL_CAPSULE | Freq: Two times a day (BID) | ORAL | Status: DC
  Administered 2019-02-24: 100 mg via ORAL
  Filled 2019-02-24 (×2): qty 1

## 2019-02-24 MED ORDER — CYCLOBENZAPRINE HCL 5 MG PO TABS
5.0000 mg | ORAL_TABLET | Freq: Two times a day (BID) | ORAL | Status: DC
  Administered 2019-02-24 (×2): 5 mg via ORAL
  Filled 2019-02-24 (×2): qty 1

## 2019-02-24 NOTE — Plan of Care (Signed)
  Problem: Activity: Goal: Risk for activity intolerance will decrease Outcome: Progressing   

## 2019-02-24 NOTE — TOC Progression Note (Signed)
Transition of Care [facility] - Progression Note    Patient Details  Name: Ann [name] MRN: [id number] Date of Birth: 11/30/1950  Transition of Care [facility]) CM/SW [place], [place] Phone Number: 02/24/2019, 4:54 PM  Clinical Narrative:     CSW provided additional requested information to [facility]. CSW provided previous level of functioning. CSW spoke with case manager Elmyra Ricks. She stated that they need SNF name to issue authorization.   CSW has noted this information and provided to [place]. CSW Lorriane Shire will follow up on Tuesday.   CSW will continue to follow.   Expected Discharge Plan: Skilled Nursing Facility Barriers to Discharge: Ship broker, Continued Medical Work up  Expected Discharge Plan and Services Expected Discharge Plan: [place] In-house Referral: Clinical Social Work Discharge Planning Services: NA Post Acute Care Choice: [place] Living arrangements for the past 2 months: Apartment                 DME Arranged: N/A DME Agency: NA       HH Arranged: NA [place] Agency: NA         Social Determinants of Health (SDOH) Interventions    Readmission Risk Interventions Readmission Risk Prevention Plan 02/23/2019  Transportation Screening Complete  PCP or Specialist Appt within 5-7 Days Complete  Home Care Screening Not Complete  Home Care Screening Not Completed Comments NA  Medication Review (RN CM) Not Complete  Med Review comments NA  Some recent data might be hidden

## 2019-02-24 NOTE — Progress Notes (Signed)
Progress Note    Ann [name]  GYK:[id number] DOB: Jan 14, 1951  DOA: 02/20/2019 PCP: [name], MD    Brief Narrative:   Chief complaint: Follow-up back pain concerning for pyelonephritis  Medical records reviewed and are as summarized below:  Ann [name] is an 68 y.o. female with a PMH of insulin-dependent diabetes, stage III-4 CKD, chronic back and knee pain, CAD, and hypertension who was admitted 02/20/2019 for evaluation of confusion, right flank pain associated with dysuria and uncontrolled blood sugars.  In the ED, her glucose was 395, creatinine 3.38, and potassium 5.3.  Urinalysis was consistent with infection and CT of the abdomen showed a 2 mm nonobstructing right renal calculus with moderate bilateral renal parenchymal atrophy which was concerning for right-sided pyelonephritis.  Urine cultures were sent and the patient was placed on empiric Rocephin.  Diuretics and lisinopril were placed on hold to address her AKI.  PT/OT evaluated her and recommended SNF placement.  Urine cultures ultimately were negative.  Patient's kidney stone was discussed with the urologist who felt that the stone should pass on its own.  Creatinine improved to baseline values with IV fluids and withholding nephrotoxic medications.  Assessment/Plan:   Principal Problem:   Pyelonephritis versus muscle spasm in a patient with known chronic back pain CT findings concerning for pyelonephritis on admission but urine cultures ultimately found to be negative.  The patient has been afebrile with a normal WBC making infection less likely.  She has been on empiric Rocephin.  To address her chronic back pain, she is currently being treated with Neurontin and Flexeril.  Patient reports her back pain is better than it was when she was first admitted, but appears to be encephalopathic.  While interviewing her today, she repeatedly fell asleep.  We will try to limit sedating medications, encourage out of bed/PT/OT,  and discontinue empiric antibiotics.  Active Problems:   HYPERTENSION, uncontrolled Systolic pressures despite resuming preadmission medications.  She is currently being managed with Norvasc 10 mg daily, HCTZ 12.5 mg daily, metoprolol 50 mg twice daily, lisinopril 20 mg daily, isosorbide 30 mg daily and hydralazine 10 mg every 6 hours as needed    CAD (coronary artery disease) No complaints of chest pain.  Continue Imdur, metoprolol.    CKD (chronic kidney disease), stage IV (HCC) Baseline creatinine 2.26.  Current creatinine elevated over typical baseline values, but have improved since admission.  GFR 18, consistent with stage IV CKD.    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 CBGs well-controlled 80s-90s.  Hemoglobin A1c 8.1%.    Acute toxic and metabolic encephalopathy Continues to be encephalopathic, possibly related to the need for muscle relaxants.  Ammonia slightly high at 37.    Chronic pain Currently being managed with Neurontin and Flexeril.    Normocytic anemia Likely secondary to chronic kidney disease.    Hyperlipidemia Continue statin.    Obesity, class III Body mass index is 44.6 kg/m.   Family Communication/Anticipated D/C date and plan/Code Status   DVT prophylaxis: Heparin ordered. Code Status: Full Code.  Family Communication: Daughter updated by telephone. Disposition Plan: Likely will need SNF.  Remains encephalopathic and unsafe for discharge, inpatient appropriate.   Medical Consultants:    None.   Anti-Infectives:    Rocephin 02/20/2019--->  Subjective:   Ann [name] appears encephalopathic this morning.  When speaking to me, she  repeatedly falls asleep, then when nudged awake, thinks she had answered my question when she had not.  Reports that her pain is mildly improved from admission.  Denies  nausea/vomiting.  Objective:    Vitals:   02/23/19 1707 02/23/19 1900 02/23/19 2058 02/24/19 0526  BP:  (!) 184/90 (!) 171/71 (!) 183/80  Pulse: (!) 57 67 77 73  Resp: 18  16 16   Temp: 98.7 F (37.1 C)  98.5 F (36.9 C) 98.7 F (37.1 C)  TempSrc: Oral  Oral Oral  SpO2: 100%  98% 98%  Weight:    114.2 kg  Height:        Intake/Output Summary (Last 24 hours) at 02/24/2019 0739 Last data filed at 02/24/2019 0600 Gross per 24 hour  Intake 920 ml  Output 2600 ml  Net -1680 ml   Filed Weights   02/24/19 0526  Weight: 114.2 kg    Exam: General: No acute distress.  Sleepy. Cardiovascular: Heart sounds show a regular rate, and rhythm. No gallops or rubs. No murmurs. No JVD. Lungs: Clear to auscultation bilaterally with decreased air movement in the bases. No rales, rhonchi or wheezes. Abdomen: Soft, nontender, nondistended with normal active bowel sounds. No masses. No hepatosplenomegaly. Neurological: Lethargic but nonfocal. Skin: Warm and dry. No rashes or lesions. Extremities: No clubbing or cyanosis. No edema. Pedal pulses 2+. Psychiatric: Mood and affect are normal. Insight and judgment are fair.   Data Reviewed:   I have personally reviewed following labs and imaging studies:  Labs: Labs show the following:   Basic Metabolic Panel: Recent Labs  Lab 02/20/19 1800 02/21/19 0447 02/23/19 1108  NA 136 141 140  K 5.3* 4.2 5.0  CL 103 110 112*  CO2 22 22 20*  GLUCOSE 395* 150* 106*  BUN 34* 33* 22  CREATININE 3.38* 3.37* 2.95*  CALCIUM 9.5 9.3 8.8*   GFR Estimated Creatinine Clearance: 21.9 mL/min (A) (by C-G formula based on SCr of 2.95 mg/dL (H)). Liver Function Tests: Recent Labs  Lab 02/20/19 1800  AST 16  ALT 13  ALKPHOS 61  BILITOT 0.4  PROT 6.7  ALBUMIN 3.2*    Recent Labs  Lab 02/21/19 0148  AMMONIA 37*    CBC: Recent Labs  Lab 02/20/19 1800 02/21/19 0447 02/23/19 1108  WBC 7.6 7.3 5.6  NEUTROABS 5.3 4.5 3.2  HGB 10.3* 9.5*  9.5*  HCT 32.2* 29.6* 29.8*  MCV 89.4 87.3 88.7  PLT 215 180 157   CBG: Recent Labs  Lab 02/23/19 0821 02/23/19 1132 02/23/19 1706 02/23/19 2054 02/24/19 0701  GLUCAP 89 97 94 91 83    Microbiology Recent Results (from the past 240 hour(s))  Urine culture     Status: None   Collection Time: 02/20/19  7:00 PM   Specimen: Urine, Clean Catch  Result Value Ref Range Status   Specimen Description URINE, CLEAN CATCH  Final   Special Requests NONE  Final   Culture   Final    NO GROWTH Performed at [facility], [place] [address] [place], [place] [address]    Report Status 02/21/2019 FINAL  Final  SARS CORONAVIRUS 2 (TAT 6-24 HRS) Nasopharyngeal Nasopharyngeal Swab     Status: None   Collection Time: 02/20/19  9:08 PM   Specimen: Nasopharyngeal Swab  Result Value Ref Range Status   SARS Coronavirus 2 NEGATIVE NEGATIVE Final    Comment: (NOTE) SARS-CoV-2 target nucleic acids are NOT DETECTED. The SARS-CoV-2 RNA is generally detectable in upper and  lower respiratory specimens during the acute phase of infection. Negative results do not preclude SARS-CoV-2 infection, do not rule out co-infections with other pathogens, and should not be used as the sole basis for treatment or other patient management decisions. Negative results must be combined with clinical observations, patient history, and epidemiological information. The expected result is Negative. Fact Sheet for Patients: SugarRoll.be Fact Sheet for Healthcare Providers: https://www.woods-mathews.com/ This test is not yet approved or cleared by the [place] FDA and  has been authorized for detection and/or diagnosis of SARS-CoV-2 by FDA under an Emergency Use Authorization (EUA). This EUA will remain  in effect (meaning this test can be used) for the duration of the COVID-19 declaration under Section 56 4(b)(1) of the Act, 21 U.S.C. section 360bbb-3(b)(1), unless the  authorization is terminated or revoked sooner. Performed at [facility], [place] [address], [place], [place] [address]     Procedures and diagnostic studies:  No results found.  Medications:   . amLODipine  10 mg Oral Daily  . cyclobenzaprine  5 mg Oral TID  . gabapentin  100 mg Oral TID  . heparin  5,000 Units Subcutaneous Q8H  . hydrochlorothiazide  12.5 mg Oral Daily  . insulin aspart  0-5 Units Subcutaneous QHS  . insulin aspart  0-9 Units Subcutaneous TID WC  . insulin glargine  20 Units Subcutaneous QHS  . isosorbide mononitrate  30 mg Oral Daily  . lisinopril  20 mg Oral Daily  . metoprolol tartrate  50 mg Oral BID  . pravastatin  10 mg Oral q1800  . sodium chloride flush  3 mL Intravenous Q12H   Continuous Infusions: . cefTRIAXone (ROCEPHIN)  IV Stopped (02/23/19 2003)  . 0.9 % sodium chloride with kcl 100 mL/hr at 02/24/19 0630     LOS: 3 days     Jacquelynn Cree  [facility] Pager [phone].   *Please refer to [email or web address], password [email or web address] to get updated schedule on who will round on this patient, as hospitalists switch teams weekly. If 7PM-7AM, please contact night-coverage at [email or web address], password [email or web address] for any overnight needs.  02/24/2019, 7:39 AM

## 2019-02-25 DIAGNOSIS — K5900 Constipation, unspecified: Secondary | ICD-10-CM

## 2019-02-25 LAB — BASIC METABOLIC PANEL
Anion gap: 8 (ref 5–15)
BUN: 20 mg/dL (ref 8–23)
CO2: 18 mmol/L — ABNORMAL LOW (ref 22–32)
Calcium: 8.6 mg/dL — ABNORMAL LOW (ref 8.9–10.3)
Chloride: 114 mmol/L — ABNORMAL HIGH (ref 98–111)
Creatinine, Ser: 2.78 mg/dL — ABNORMAL HIGH (ref 0.44–1.00)
GFR calc Af Amer: 19 mL/min — ABNORMAL LOW (ref >60)
GFR calc non Af Amer: 17 mL/min — ABNORMAL LOW (ref >60)
Glucose, Bld: 134 mg/dL — ABNORMAL HIGH (ref 70–99)
Potassium: 4.8 mmol/L (ref 3.5–5.1)
Sodium: 140 mmol/L (ref 135–145)

## 2019-02-25 LAB — GLUCOSE, CAPILLARY
Glucose-Capillary: 121 mg/dL — ABNORMAL HIGH (ref 70–99)
Glucose-Capillary: 145 mg/dL — ABNORMAL HIGH (ref 70–99)
Glucose-Capillary: 186 mg/dL — ABNORMAL HIGH (ref 70–99)
Glucose-Capillary: 168 mg/dL — ABNORMAL HIGH (ref 70–99)

## 2019-02-25 MED ORDER — POLYETHYLENE GLYCOL 3350 17 G PO PACK
17.0000 g | PACK | Freq: Every day | ORAL | Status: DC
  Administered 2019-02-25 – 2019-02-26 (×2): 17 g via ORAL
  Filled 2019-02-25 (×3): qty 1

## 2019-02-25 MED ORDER — GABAPENTIN 100 MG PO CAPS
100.0000 mg | ORAL_CAPSULE | Freq: Every day | ORAL | Status: DC
  Administered 2019-02-25 – 2019-02-27 (×3): 100 mg via ORAL
  Filled 2019-02-25 (×3): qty 1

## 2019-02-25 NOTE — Progress Notes (Signed)
Progress Note    Ann [name]  WVP:[id number] DOB: [birth year]  DOA: 02/20/2019 PCP: [name], MD    Brief Narrative:   Chief complaint: Follow-up back pain concerning for pyelonephritis  Medical records reviewed and are as summarized below:  Ann [name] is an 68 y.o. female with a PMH of insulin-dependent diabetes, stage III-4 CKD, chronic back and knee pain, CAD, and hypertension who was admitted 02/20/2019 for evaluation of confusion, right flank pain associated with dysuria and uncontrolled blood sugars.  In the ED, her glucose was 395, creatinine 3.38, and potassium 5.3.  Urinalysis was consistent with infection and CT of the abdomen showed a 2 mm nonobstructing right renal calculus with moderate bilateral renal parenchymal atrophy which was concerning for right-sided pyelonephritis.  Urine cultures were sent and the patient was placed on empiric Rocephin.  Diuretics and lisinopril were placed on hold to address her AKI.  PT/OT evaluated her and recommended SNF placement.  Urine cultures ultimately were negative.  Patient's kidney stone was discussed with the urologist who felt that the stone should pass on its own.  Creatinine improved to baseline values with IV fluids and withholding nephrotoxic medications.  Assessment/Plan:   Principal Problem:   Pyelonephritis versus muscle spasm in a patient with known chronic back pain CT findings concerning for pyelonephritis on admission but urine cultures ultimately found to be negative.  The patient has been afebrile with a normal WBC making infection less likely.  She has been on empiric Rocephin.  To address her chronic back pain, she is currently being treated with Neurontin and Flexeril.  Sedating medications reduced 02/24/2019, and the patient has been seen and evaluated by PT with SNF recommended.  Continues to fall asleep when being interviewed.  Will discontinue Flexeril.  We will repeat Covid testing in anticipation of  discharge.  Active Problems:   HYPERTENSION, uncontrolled Systolic pressures slightly improved today.  She is currently being managed with Norvasc 10 mg daily, HCTZ 12.5 mg daily, metoprolol 50 mg twice daily, lisinopril 20 mg daily, isosorbide 30 mg daily and hydralazine 10 mg every 6 hours as needed    CAD (coronary artery disease) No complaints of chest pain.  Continue Imdur, metoprolol.    CKD (chronic kidney disease), stage IV (HCC) Baseline creatinine 2.26.  Current creatinine elevated over typical baseline values, but have improved since admission.  Creatinine slowly improving, down to 2.78 today.    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 CBGs 83-165.  Hemoglobin A1c 8.1%.    Acute toxic and metabolic encephalopathy Sedating medications decreased 02/24/2019 due to ongoing encephalopathy.  Continues to appear to be encephalopathic so we will discontinue Flexeril entirely.    Chronic pain Currently being managed with Neurontin and Flexeril, dose reduced yesterday.  Completely discontinue Flexeril.    Normocytic anemia Likely secondary to chronic kidney disease.    Hyperlipidemia Continue statin.    Obesity, class III Body mass index is 44.91 kg/m.     Constipation Add daily MiraLAX.   Family Communication/Anticipated D/C date and plan/Code Status   DVT prophylaxis: Heparin ordered. Code Status: Full Code.  Family Communication: Daughter updated by telephone. Disposition Plan: Likely will need SNF.  Remains encephalopathic and unsafe for discharge, inpatient appropriate.   Medical Consultants:    None.   Anti-Infectives:    Rocephin 02/20/2019---> 02/24/2019  Subjective:   Mrs.  [name] reports some problems with constipation.  She continues to fall asleep when being interviewed, but acknowledges that her pain has improved  and she no longer has any dysuria.  Objective:    Vitals:   02/24/19 1331 02/24/19 1649 02/24/19 2116 02/25/19 0354  BP: (!) 148/78 (!) 157/72 (!) 160/79 (!) 179/76  Pulse: 65 67 70 64  Resp:  16 16 16   Temp:  97.6 F (36.4 C) 98.2 F (36.8 C) 98.5 F (36.9 C)  TempSrc:  Oral Oral Oral  SpO2:  100% 99% 100%  Weight:    115 kg  Height:        Intake/Output Summary (Last 24 hours) at 02/25/2019 0751 Last data filed at 02/25/2019 0600 Gross per 24 hour  Intake 6958.95 ml  Output 300 ml  Net 6658.95 ml   Filed Weights   02/24/19 0526 02/25/19 0354  Weight: 114.2 kg 115 kg    Exam: General: No acute distress.  Remains sleepy.  Falls asleep during my interview. Cardiovascular: Heart sounds show a regular rate, and rhythm. No gallops or rubs. No murmurs. No JVD. Lungs: Clear to auscultation bilaterally with good air movement. No rales, rhonchi or wheezes. Abdomen: Soft, nontender, nondistended with normal active bowel sounds. No masses. No hepatosplenomegaly. Skin: Warm and dry. No rashes or lesions. Extremities: No clubbing or cyanosis. No edema. Pedal pulses 2+.  Data Reviewed:   I have personally reviewed following labs and imaging studies:  Labs: Labs show the following:   Basic Metabolic Panel: Recent Labs  Lab 02/20/19 1800 02/21/19 0447 02/23/19 1108 02/25/19 0638  NA 136 141 140 140  K 5.3* 4.2 5.0 4.8  CL 103 110 112* 114*  CO2 22 22 20* 18*  GLUCOSE 395* 150* 106* 134*  BUN 34* 33* 22 20  CREATININE 3.38* 3.37* 2.95* 2.78*  CALCIUM 9.5 9.3 8.8* 8.6*   GFR Estimated Creatinine Clearance: 23.3 mL/min (A) (by C-G formula based on SCr of 2.78 mg/dL (H)). Liver Function Tests: Recent Labs  Lab 02/20/19 1800  AST 16  ALT 13  ALKPHOS 61  BILITOT 0.4  PROT 6.7  ALBUMIN 3.2*    Recent Labs  Lab 02/21/19 0148  AMMONIA 37*    CBC: Recent Labs  Lab 02/20/19 1800 02/21/19 0018 02/21/19 0447 02/23/19 1108  WBC 7.6  --  7.3 5.6  NEUTROABS  5.3  --  4.5 3.2  HGB 10.3*  --  9.5* 9.5*  HCT 32.2* 31.2* 29.6* 29.8*  MCV 89.4  --  87.3 88.7  PLT 215  --  180 157   CBG: Recent Labs  Lab 02/24/19 0701 02/24/19 1141 02/24/19 1644 02/24/19 2116 02/25/19 0652  GLUCAP 83 109* 163* 165* 121*    Microbiology Recent Results (from the past 240 hour(s))  Urine culture     Status: None   Collection Time: 02/20/19  7:00 PM   Specimen: Urine, Clean Catch  Result Value Ref Range Status   Specimen Description URINE, CLEAN CATCH  Final   Special Requests NONE  Final   Culture   Final    NO GROWTH Performed at [facility], [place] [address], [place], [place] [address]    Report Status 02/21/2019 FINAL  Final  SARS CORONAVIRUS 2 (TAT 6-24 HRS) Nasopharyngeal Nasopharyngeal Swab     Status: None   Collection Time: 02/20/19  9:08 PM   Specimen: Nasopharyngeal Swab  Result Value Ref Range Status   SARS Coronavirus 2 NEGATIVE NEGATIVE Final  Comment: (NOTE) SARS-CoV-2 target nucleic acids are NOT DETECTED. The SARS-CoV-2 RNA is generally detectable in upper and lower respiratory specimens during the acute phase of infection. Negative results do not preclude SARS-CoV-2 infection, do not rule out co-infections with other pathogens, and should not be used as the sole basis for treatment or other patient management decisions. Negative results must be combined with clinical observations, patient history, and epidemiological information. The expected result is Negative. Fact Sheet for Patients: SugarRoll.be Fact Sheet for Healthcare Providers: https://www.woods-mathews.com/ This test is not yet approved or cleared by the [place] FDA and  has been authorized for detection and/or diagnosis of SARS-CoV-2 by FDA under an Emergency Use Authorization (EUA). This EUA will remain  in effect (meaning this test can be used) for the duration of the COVID-19 declaration under Section 56  4(b)(1) of the Act, 21 U.S.C. section 360bbb-3(b)(1), unless the authorization is terminated or revoked sooner. Performed at [facility], [place] [address], [place], [place] [address]     Procedures and diagnostic studies:  No results found.  Medications:   . amLODipine  10 mg Oral Daily  . cyclobenzaprine  5 mg Oral BID  . gabapentin  100 mg Oral BID  . heparin  5,000 Units Subcutaneous Q8H  . hydrochlorothiazide  12.5 mg Oral Daily  . insulin aspart  0-5 Units Subcutaneous QHS  . insulin aspart  0-9 Units Subcutaneous TID WC  . insulin glargine  20 Units Subcutaneous QHS  . isosorbide mononitrate  30 mg Oral Daily  . lisinopril  20 mg Oral Daily  . metoprolol tartrate  50 mg Oral BID  . pravastatin  10 mg Oral q1800  . sodium chloride flush  3 mL Intravenous Q12H   Continuous Infusions: . 0.9 % sodium chloride with kcl 100 mL/hr at 02/25/19 0549     LOS: 4 days     Jacquelynn Cree  [facility] Pager [phone].   *Please refer to [email or web address], password [email or web address] to get updated schedule on who will round on this patient, as hospitalists switch teams weekly. If 7PM-7AM, please contact night-coverage at [email or web address], password [email or web address] for any overnight needs.  02/25/2019, 7:51 AM

## 2019-02-25 NOTE — Progress Notes (Signed)
Physical Therapy Treatment Patient Details Name: Ann [name] MRN: [id number] DOB: 06/08/1950 Today's Date: 02/25/2019    History of Present Illness 68 year old female admitted with confusion, right side flank pain. PMH to include: IDDM, HTN, CKD, chronic back and knee pain    PT Comments    Pt was seen for mobility with pt declining to stand up.  Very lethargic presentation today and was not able to stay awake unless PT continually spoke and tapped her.  Pt is demonstrating a limited effort, and did elevate her legs to avoid edema at the end of the session.  Follow up with her as tolerated, to shorten time needed to stay in SNF for rehab.  Pt will need to focus further therapy on standing and balancing.  Follow Up Recommendations  SNF     Equipment Recommendations  None recommended by PT    Recommendations for Other Services  None     Precautions / Restrictions Precautions Precautions: Fall Precaution Comments: elevate legs to manage edema Restrictions Weight Bearing Restrictions: No    Mobility  Bed Mobility               General bed mobility comments: pt was in chair when PT arrived  Transfers                 General transfer comment: declined  Ambulation/Gait                 Stairs             Wheelchair Mobility    Modified Rankin (Stroke Patients Only)       Balance                                            Cognition Arousal/Alertness: Lethargic Behavior During Therapy: Flat affect Overall Cognitive Status: No family/caregiver present to determine baseline cognitive functioning Area of Impairment: Awareness;Problem solving;Following commands                     Memory: Decreased short-term memory Following Commands: Follows one step commands with increased time;Follows one step commands inconsistently   Awareness: Intellectual Problem Solving: Slow processing;Requires verbal cues;Requires  tactile cues General Comments: falls asleep during exercises      Exercises General Exercises - Lower Extremity Ankle Circles/Pumps: AAROM;5 reps Long Arc Quad: AAROM;Strengthening;10 reps Heel Slides: AAROM;10 reps Hip ABduction/ADduction: AAROM;10 reps Straight Leg Raises: AAROM;10 reps Hip Flexion/Marching: AAROM;10 reps Toe Raises: 10 reps    General Comments        Pertinent Vitals/Pain Pain Assessment: No/denies pain    Home Living                      Prior Function            PT Goals (current goals can now be found in the care plan section) Acute Rehab PT Goals Patient Stated Goal: none stated Progress towards PT goals: Progressing toward goals    Frequency    Min 2X/week      PT Plan Frequency needs to be updated    Co-evaluation              AM-PAC PT "6 Clicks" Mobility   Outcome Measure  Help needed turning from your back to your side while in a flat bed without using bedrails?: A  Little Help needed moving from lying on your back to sitting on the side of a flat bed without using bedrails?: A Little Help needed moving to and from a bed to a chair (including a wheelchair)?: A Lot Help needed standing up from a chair using your arms (e.g., wheelchair or bedside chair)?: A Lot Help needed to walk in hospital room?: Total Help needed climbing 3-5 steps with a railing? : Total 6 Click Score: 12    End of Session   Activity Tolerance: Patient tolerated treatment well;Other (comment);Patient limited by pain Patient left: in chair;with call bell/phone within reach;with chair alarm set Nurse Communication: Mobility status PT Visit Diagnosis: Muscle weakness (generalized) (M62.81);Difficulty in walking, not elsewhere classified (R26.2);Pain;Other abnormalities of gait and mobility (R26.89)     Time: 1434-1500 PT Time Calculation (min) (ACUTE ONLY): 26 min  Charges:  $Therapeutic Exercise: 23-37 mins                  Ramond Dial 02/25/2019, 8:00 PM    Mee Hives, PT MS Acute Rehab Dept. Number: [place]

## 2019-02-26 LAB — BASIC METABOLIC PANEL
Anion gap: 8 (ref 5–15)
BUN: 24 mg/dL — ABNORMAL HIGH (ref 8–23)
CO2: 18 mmol/L — ABNORMAL LOW (ref 22–32)
Calcium: 8.6 mg/dL — ABNORMAL LOW (ref 8.9–10.3)
Chloride: 112 mmol/L — ABNORMAL HIGH (ref 98–111)
Creatinine, Ser: 2.82 mg/dL — ABNORMAL HIGH (ref 0.44–1.00)
GFR calc Af Amer: 19 mL/min — ABNORMAL LOW (ref >60)
GFR calc non Af Amer: 16 mL/min — ABNORMAL LOW (ref >60)
Glucose, Bld: 155 mg/dL — ABNORMAL HIGH (ref 70–99)
Potassium: 5.1 mmol/L (ref 3.5–5.1)
Sodium: 138 mmol/L (ref 135–145)

## 2019-02-26 LAB — GLUCOSE, CAPILLARY
Glucose-Capillary: 134 mg/dL — ABNORMAL HIGH (ref 70–99)
Glucose-Capillary: 163 mg/dL — ABNORMAL HIGH (ref 70–99)
Glucose-Capillary: 125 mg/dL — ABNORMAL HIGH (ref 70–99)
Glucose-Capillary: 95 mg/dL (ref 70–99)

## 2019-02-26 LAB — SARS CORONAVIRUS 2 (TAT 6-24 HRS): SARS Coronavirus 2: NEGATIVE

## 2019-02-26 NOTE — Plan of Care (Signed)
  Problem: Health Behavior/Discharge Planning: Goal: Ability to manage health-related needs will improve Outcome: Progressing   

## 2019-02-26 NOTE — Progress Notes (Signed)
PROGRESS NOTE    Ann [name]  ATF:[id number] DOB: 14-Mar-1951 DOA: 02/20/2019 PCP: [name], MD    Brief Narrative:  Patient admitted with concern for possible pyelonephritis subsequent toxic metabolic encephalopathy patient has been on IV antibiotics and sedating medications have gradually been reduced or discontinued.  The hope is that the patient will be able to be transferred to a SNF in the next 1 to 2 days.   Assessment & Plan:   Pyelonephritis versus muscle spasm in a patient with known chronic back pain CT findings concerning for pyelonephritis on admission but urine cultures ultimately found to be negative.  The patient has been afebrile with a normal WBC making infection less likely.  She has been on empiric Rocephin.  To address her chronic back pain, she is currently being treated with Neurontin and Flexeril.  Sedating medications reduced 02/24/2019, and the patient has been seen and evaluated by PT with SNF recommended.  Continues to fall asleep when being interviewed.  Discontinued Flexeril.  We will repeat Covid testing in anticipation of discharge to SNF.    Acute toxic and metabolic encephalopathy Sedating medications decreased 02/24/2019 due to ongoing encephalopathy.  Continues to appear to be encephalopathic so we discontinued Flexeril entirely.    HYPERTENSION, uncontrolled Norvasc 10 mg daily, HCTZ 12.5 mg daily, metoprolol 50 mg twice daily, lisinopril 20 mg daily, isosorbide 30 mg daily and hydralazine 10 mg every 6 hours as needed    CAD (coronary artery disease) Continue Imdur, metoprolol.    CKD (chronic kidney disease), stage IV (HCC) Baseline creatinine 2.26.    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 CBGs 83-165.  Hemoglobin A1c 8.1%.    Chronic pain Neurontin, dose reduced.  Completely discontinued Flexeril.    Normocytic anemia  Likely secondary to chronic kidney disease.    Hyperlipidemia Continue statin.    Obesity, class III Body mass index is 44.91 kg/m.     Constipation Add daily MiraLAX.   DVT prophylaxis: Heparin SQ Code Status:Full code   Consultants:   n/a  Procedures:   n/a  Antimicrobials:  Completed course of IV ceftriaxone   Subjective: No acute complaints today but the patient is clearly still slightly encephalopathic.  Objective: Vitals:   02/25/19 2059 02/26/19 0520 02/26/19 0822 02/26/19 0852  BP: (!) 159/83 (!) 169/80 (!) 150/127 (!) 158/91  Pulse: 70 65 74 68  Resp:  15 18   Temp: 98.5 F (36.9 C) 98 F (36.7 C) 98.2 F (36.8 C)   TempSrc: Oral  Oral   SpO2: 99% 98% 98%   Weight:      Height:        Intake/Output Summary (Last 24 hours) at 02/26/2019 1457 Last data filed at 02/26/2019 1300 Gross per 24 hour  Intake 1652.45 ml  Output 600 ml  Net 1052.45 ml   Filed Weights   02/24/19 0526 02/25/19 0354  Weight: 114.2 kg 115 kg    Examination:  General exam: Appears calm and comfortable  Respiratory system: Clear to auscultation. Respiratory effort normal. Cardiovascular system: S1 & S2 heard, RRR. No JVD, murmurs, rubs, gallops or clicks. No pedal edema. Gastrointestinal system: Abdomen is nondistended, soft and nontender. No organomegaly or masses felt. Normal bowel sounds heard. Central nervous system: Alert and oriented x2 (self and place but not time) Extremities: Symmetric 5 x 5 power. Skin: No rashes, lesions or ulcers  LOS: 5 days    Time spent: 35 mins    Charolotte Capuchin, MD [facility] Pager [phone]  If 7PM-7AM, please contact night-coverage [email or web address] Password [email or web address] 02/26/2019, 2:57 PM

## 2019-02-27 LAB — GLUCOSE, CAPILLARY
Glucose-Capillary: 52 mg/dL — ABNORMAL LOW (ref 70–99)
Glucose-Capillary: 80 mg/dL (ref 70–99)
Glucose-Capillary: 111 mg/dL — ABNORMAL HIGH (ref 70–99)
Glucose-Capillary: 155 mg/dL — ABNORMAL HIGH (ref 70–99)
Glucose-Capillary: 123 mg/dL — ABNORMAL HIGH (ref 70–99)

## 2019-02-27 LAB — BASIC METABOLIC PANEL
Anion gap: 8 (ref 5–15)
BUN: 21 mg/dL (ref 8–23)
CO2: 15 mmol/L — ABNORMAL LOW (ref 22–32)
Calcium: 8.6 mg/dL — ABNORMAL LOW (ref 8.9–10.3)
Chloride: 117 mmol/L — ABNORMAL HIGH (ref 98–111)
Creatinine, Ser: 2.64 mg/dL — ABNORMAL HIGH (ref 0.44–1.00)
GFR calc Af Amer: 21 mL/min — ABNORMAL LOW (ref >60)
GFR calc non Af Amer: 18 mL/min — ABNORMAL LOW (ref >60)
Glucose, Bld: 70 mg/dL (ref 70–99)
Potassium: 5 mmol/L (ref 3.5–5.1)
Sodium: 140 mmol/L (ref 135–145)

## 2019-02-27 MED ORDER — INSULIN GLARGINE 100 UNIT/ML ~~LOC~~ SOLN
16.0000 [IU] | Freq: Every day | SUBCUTANEOUS | Status: DC
  Administered 2019-02-27: 16 [IU] via SUBCUTANEOUS
  Filled 2019-02-27 (×2): qty 0.16

## 2019-02-27 NOTE — Progress Notes (Signed)
PROGRESS NOTE    Ann [name]  JOA:[id number] DOB: 1951/01/18 DOA: 02/20/2019 PCP: [name], MD    Brief Narrative:  Patient admitted with concern for possible pyelonephritis and subsequent toxic metabolic encephalopathy. Patient had been on IV antibiotics and sedating medications have gradually been reduced or discontinued.  The hope is that the patient will be able to be transferred to a SNF in the next 1 to 2 days.   Assessment & Plan:   Pyelonephritis versus muscle spasm in a patient with known chronic back pain CT findings concerning for pyelonephritis on admission but urine cultures ultimately found to be negative.  The patient has been afebrile with a normal WBC making infection less likely.  She has been on empiric Rocephin.  To address her chronic back pain, she is currently being treated with Neurontin and Flexeril.  Sedating medications reduced 02/24/2019, and the patient has been seen and evaluated by PT with SNF recommended.  Continues to fall asleep when being interviewed.  Discontinued Flexeril.  We will repeat Covid testing in anticipation of discharge to SNF.    Acute toxic and metabolic encephalopathy; slowly improving Sedating medications decreased 02/24/2019 due to ongoing encephalopathy.  Continues to appear to be encephalopathic so we discontinued Flexeril entirely. -Patient received IV ceftriaxone for possible urinary infection    HYPERTENSION, uncontrolled Norvasc 10 mg daily, HCTZ 12.5 mg daily, metoprolol 50 mg twice daily, lisinopril 20 mg daily, isosorbide 30 mg daily and hydralazine 10 mg every 6 hours as needed    CAD (coronary artery disease) Continue Imdur, metoprolol.    CKD (chronic kidney disease), stage IV (HCC) Baseline creatinine 2.26.    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 CBGs 83-165.  Hemoglobin A1c 8.1%.    Chronic pain  Neurontin, dose reduced.  Completely discontinued Flexeril.    Normocytic anemia Likely secondary to chronic kidney disease.    Hyperlipidemia Continue statin.    Obesity, class III Body mass index is 44.91 kg/m.     Constipation Add daily MiraLAX.   DVT prophylaxis: Heparin SQ Code Status:Full code   Consultants:   n/a  Procedures:   n/a  Antimicrobials:  Completed course of IV ceftriaxone   Subjective: No acute complaints today but the patient is clearly still slightly encephalopathic and occasionally answers questions appropriately.  Objective: Vitals:   02/26/19 2124 02/27/19 0452 02/27/19 0838 02/27/19 1300  BP: (!) 161/80 (!) 160/79 (!) 167/81 140/79  Pulse: 67 70 75 (!) 59  Resp: 16 17 18    Temp: 98.3 F (36.8 C) 97.9 F (36.6 C)    TempSrc: Oral Oral    SpO2: 98% 100% 97% 99%  Weight:      Height:        Intake/Output Summary (Last 24 hours) at 02/27/2019 1545 Last data filed at 02/27/2019 1500 Gross per 24 hour  Intake 4818.16 ml  Output 0 ml  Net 4818.16 ml   Filed Weights   02/24/19 0526 02/25/19 0354  Weight: 114.2 kg 115 kg    Examination:  General exam: Appears calm and comfortable  Respiratory system: Clear to auscultation. Respiratory effort normal. Cardiovascular system: S1 & S2 heard, RRR. No JVD, murmurs, rubs, gallops or clicks. No pedal edema. Gastrointestinal system: Abdomen is nondistended, soft and nontender. No organomegaly or masses felt. Normal bowel sounds heard. Central nervous system: Alert and oriented x2 (self and place but not time or  situation) Extremities: Symmetric 5 x 5 power. Skin: No rashes, lesions or ulcers     LOS: 6 days    Time spent: 25 mins    Charolotte Capuchin, MD [facility] Pager [phone]  If 7PM-7AM, please contact night-coverage [email or web address] Password [facility] 02/27/2019, 3:45 PM

## 2019-02-27 NOTE — Progress Notes (Signed)
Occupational Therapy Treatment Patient Details Name: CHERYL CHAY MRN: [id number] DOB: [birth year] Today's Date: 02/27/2019    History of present illness 68 year old female admitted with confusion, right side flank pain. PMH to include: IDDM, HTN, CKD, chronic back and knee pain   OT comments  Pt progressing towards acute OT goals. Cognition limiting session though pt was ultimately able to walk from recliner to threshold of bathroom. +2 assist for chair follow utilized as pt with generalized weakness, decreased activity tolerance, and decreased insight into deficits. D/c plan remains appropriate.    Follow Up Recommendations  SNF;Supervision/Assistance - 24 hour    Equipment Recommendations  Other (comment)(TBD at next venue of care)    Recommendations for Other Services      Precautions / Restrictions Precautions Precautions: Fall Precaution Comments: elevate legs to manage edema Restrictions Weight Bearing Restrictions: No       Mobility Bed Mobility               General bed mobility comments: up in chair  Transfers Overall transfer level: Needs assistance Equipment used: Rolling walker (2 wheeled) Transfers: Sit to/from Stand Sit to Stand: +2 safety/equipment;Min assist;+2 physical assistance;Mod assist         General transfer comment: inconsistent command following. improved performance on second trial. fatigue noted on third trial. Assist to powerup and steady. Pt unable to follow cues for hand placement during transition so assist provided to stabilize rw.    Balance Overall balance assessment: Needs assistance Sitting-balance support: No upper extremity supported;Feet supported Sitting balance-Leahy Scale: Fair     Standing balance support: Bilateral upper extremity supported;Single extremity supported Standing balance-Leahy Scale: Poor Standing balance comment: fair static, poor dynamic                           ADL either performed  or assessed with clinical judgement   ADL Overall ADL's : Needs assistance/impaired       Grooming Details (indicate cue type and reason): attempted oral care task with setup and cues provided. Pt had just finished lunch. Pt with decreased initiation with this task so deferred for now.                  Toilet Transfer: Minimal assistance;+2 for safety/equipment;Ambulation;RW Toilet Transfer Details (indicate cue type and reason): ambulated from recliner to threshold of bathroom. Pt reporting that she did not need to void. For now, she appears able to walk to a BSC vs toilet with min A +2 for having a chair ready.         Functional mobility during ADLs: Minimal assistance;+2 for safety/equipment General ADL Comments: Pt limited by decreased cognition and activity tolerance. Although she did ultimately walk a little further than planned. Max cues/encouragement 2/2 cognition. 3x sit<>stand, successfully followed cue to walk on third trial.      Vision       Perception     Praxis      Cognition Arousal/Alertness: Awake/alert(dozes off quickly ) Behavior During Therapy: (giddy) Overall Cognitive Status: No family/caregiver present to determine baseline cognitive functioning Area of Impairment: Attention;Following commands;Safety/judgement;Awareness;Problem solving;Memory;Orientation                 Orientation Level: Situation;Time;Place Current Attention Level: Sustained Memory: Decreased short-term memory Following Commands: Follows one step commands with increased time;Follows one step commands inconsistently Safety/Judgement: Decreased awareness of deficits Awareness: Intellectual Problem Solving: Slow processing;Requires verbal cues;Requires tactile cues General  Comments: still presenting with some encephalitis. giddy, laughing at random times. Pleasantly confused. Difficulty following conversation appropriately. Tangential responses. Following 1 step commands  inconsistently. Some improved command following with max cues but not always consistent result with this approach.        Exercises     Shoulder Instructions       General Comments      Pertinent Vitals/ Pain       Pain Assessment: Faces Faces Pain Scale: Hurts even more Pain Location: R hip with mobility Pain Descriptors / Indicators: Guarding;Sore Pain Intervention(s): Monitored during session;Limited activity within patient's tolerance;Repositioned  Home Living                                          Prior Functioning/Environment              Frequency  Min 2X/week        Progress Toward Goals  OT Goals(current goals can now be found in the care plan section)  Progress towards OT goals: Progressing toward goals  Acute Rehab OT Goals Patient Stated Goal: none stated OT Goal Formulation: Patient unable to participate in goal setting Time For Goal Achievement: 03/08/19 Potential to Achieve Goals: Fair ADL Goals Pt Will Perform Grooming: with set-up;with supervision;sitting;bed level Pt Will Perform Upper Body Bathing: with supervision;with set-up;sitting;bed level Pt Will Transfer to Toilet: with max assist;with +2 assist;squat pivot transfer;bedside commode Additional ADL Goal #1: Pt will follow 1 step commands with 90% accuracy, attending to task with less than minimal cueing in a non distracting enviorment in order to maxmize participation in ADLs. Additional ADL Goal #2: Pt will complete bed mobility with min assist and maintain dynamic sitting balance with min assist as precursor to ADLs.  Plan Discharge plan remains appropriate    Co-evaluation                 AM-PAC OT "6 Clicks" Daily Activity     Outcome Measure   Help from another person eating meals?: A Little Help from another person taking care of personal grooming?: A Little Help from another person toileting, which includes using toliet, bedpan, or urinal?: A  Lot Help from another person bathing (including washing, rinsing, drying)?: A Lot Help from another person to put on and taking off regular upper body clothing?: A Lot Help from another person to put on and taking off regular lower body clothing?: Total 6 Click Score: 13    End of Session Equipment Utilized During Treatment: Gait belt;Rolling walker  OT Visit Diagnosis: Other abnormalities of gait and mobility (R26.89);Pain;Other symptoms and signs involving cognitive function Pain - Right/Left: Right Pain - part of body: Hip   Activity Tolerance Patient limited by fatigue;Patient tolerated treatment well;Patient limited by pain   Patient Left in chair;with call bell/phone within reach;with chair alarm set   Nurse Communication          Time: 5462-7035 OT Time Calculation (min): 30 min  Charges: OT General Charges $OT Visit: 1 Visit OT Treatments $Self Care/Home Management : 23-37 mins  Tyrone Schimke, OT Acute Rehabilitation Services Pager: [phone] Office: [phone]    Hortencia Pilar 02/27/2019, 2:22 PM

## 2019-02-28 LAB — GLUCOSE, CAPILLARY
Glucose-Capillary: 70 mg/dL (ref 70–99)
Glucose-Capillary: 109 mg/dL — ABNORMAL HIGH (ref 70–99)

## 2019-02-28 MED ORDER — INSULIN DETEMIR 100 UNIT/ML ~~LOC~~ SOLN: 16.0000 [IU] | Freq: Every day | SUBCUTANEOUS | 11 refills | Status: DC

## 2019-02-28 MED ORDER — GABAPENTIN 100 MG PO CAPS: 100.0000 mg | ORAL_CAPSULE | Freq: Every day | ORAL | 3 refills | Status: DC

## 2019-02-28 NOTE — Progress Notes (Signed)
Physical Therapy Treatment Patient Details Name: Ann [name] MRN: [id number] DOB: [birth year] Today's Date: 02/28/2019    History of Present Illness 68 year old female admitted with confusion, right side flank pain. PMH to include: IDDM, HTN, CKD, chronic back and knee pain    PT Comments    Pt was seen for gait skill training, with a much better tolerance for standing today.  Pt is usually lethargic but today was motivated to get to BR.  Follow up with her as needed for strengthening and balance training, and to increase endurance as needed to return home with husband as planned.  SNF discharge is still expected to give her greater standing balance control and reduce her fall risk.   Follow Up Recommendations  SNF     Equipment Recommendations  None recommended by PT    Recommendations for Other Services       Precautions / Restrictions Precautions Precautions: Fall Precaution Comments: elevate legs to manage edema Restrictions Weight Bearing Restrictions: No    Mobility  Bed Mobility Overal bed mobility: Needs Assistance             General bed mobility comments: up in chair  Transfers Overall transfer level: Needs assistance Equipment used: Rolling walker (2 wheeled);1 person hand held assist Transfers: Sit to/from Stand Sit to Stand: Min assist         General transfer comment: pt is struggling to follow through on thoughts that are related to the task, distracted and frequently carefully redirected  Ambulation/Gait Ambulation/Gait assistance: Min assist Gait Distance (Feet): 25 Feet Assistive device: Rolling walker (2 wheeled);1 person hand held assist Gait Pattern/deviations: Step-through pattern;Shuffle;Wide base of support Gait velocity: reduced Gait velocity interpretation: <1.31 ft/sec, indicative of household ambulator General Gait Details: close cues for sequence with pt being able to get to BR but was incontinent on the way   Stairs              Wheelchair Mobility    Modified Rankin (Stroke Patients Only)       Balance Overall balance assessment: Needs assistance Sitting-balance support: Feet supported Sitting balance-Leahy Scale: Fair     Standing balance support: Bilateral upper extremity supported;During functional activity Standing balance-Leahy Scale: Poor Standing balance comment: pt needed cues for standing posture and safety in BR esp as PT assisted her to get cleaned up                            Cognition Arousal/Alertness: Awake/alert Behavior During Therapy: Flat affect Overall Cognitive Status: No family/caregiver present to determine baseline cognitive functioning Area of Impairment: Problem solving;Awareness;Safety/judgement;Following commands;Attention                   Current Attention Level: Selective Memory: Decreased recall of precautions;Decreased short-term memory Following Commands: Follows one step commands inconsistently;Follows one step commands with increased time Safety/Judgement: Decreased awareness of safety;Decreased awareness of deficits Awareness: Intellectual Problem Solving: Slow processing;Requires verbal cues General Comments: pt is distracted with trivial thoughts unrelated to the effort and safety of gait and transfers      Exercises      General Comments General comments (skin integrity, edema, etc.): pt is having some incontinence with longer walks but was not aware until she arrived and sat down in BR      Pertinent Vitals/Pain Pain Assessment: Faces Faces Pain Scale: No hurt    Home Living  Prior Function            PT Goals (current goals can now be found in the care plan section) Acute Rehab PT Goals Patient Stated Goal: to get home when able Progress towards PT goals: Progressing toward goals    Frequency    Min 2X/week      PT Plan Current plan remains appropriate    Co-evaluation               AM-PAC PT "6 Clicks" Mobility   Outcome Measure  Help needed turning from your back to your side while in a flat bed without using bedrails?: A Little Help needed moving from lying on your back to sitting on the side of a flat bed without using bedrails?: A Little Help needed moving to and from a bed to a chair (including a wheelchair)?: A Little Help needed standing up from a chair using your arms (e.g., wheelchair or bedside chair)?: A Little Help needed to walk in hospital room?: A Little Help needed climbing 3-5 steps with a railing? : A Lot 6 Click Score: 17    End of Session Equipment Utilized During Treatment: Gait belt Activity Tolerance: Patient tolerated treatment well;Patient limited by fatigue Patient left: in chair;with call bell/phone within reach;with chair alarm set Nurse Communication: Mobility status PT Visit Diagnosis: Muscle weakness (generalized) (M62.81);Difficulty in walking, not elsewhere classified (R26.2);Pain;Other abnormalities of gait and mobility (R26.89)     Time: 1771-1657 PT Time Calculation (min) (ACUTE ONLY): 32 min  Charges:  $Gait Training: 8-22 mins $Therapeutic Activity: 8-22 mins                    Ramond Dial 02/28/2019, 3:00 PM   Mee Hives, PT MS Acute Rehab Dept. Number: [place]

## 2019-02-28 NOTE — TOC Transition Note (Signed)
Transition of Care [facility]) - CM/SW Discharge Note *Discharged to [facility] via ambulance  Patient Details  Name: Ann [name] MRN: [id number] Date of Birth: 12/28/1950  Transition of Care [facility]) CM/SW Contact:  Sable Feil, LCSW Phone Number: 02/28/2019, 3:48 PM   Clinical Narrative: Daughter, Ann [name] contacted and informed of today's discharge to facility and ambulance transport. Discharge clinicals transmitted to facility and ambulance transport arranged. Nurse provided with information to call report.      Final next level of care: Skilled Nursing Facility Barriers to Discharge: No Barriers Identified   Patient Goals and CMS Choice Patient states their goals for this hospitalization and ongoing recovery are:: Per daughter, patient needs rehab to get stronger before returning home CMS Medicare.gov Compare Post Acute Care list provided to:: Patient Represenative (must comment)(Daughter informed about [email or web address]) Choice offered to / list presented to : Adult Children  Discharge Placement PASRR number recieved: 02/23/19            Patient chooses bed at: [facility] Patient to be transferred to facility by: Ambulance Name of family member notified: Ann [name] - daughter - [phone] Patient and family notified of of transfer: 02/28/19  Discharge Plan and Services In-house Referral: Clinical Social Work Discharge Planning Services: NA Post Acute Care Choice: [facility]          DME Arranged: N/A DME Agency: NA       HH Arranged: NA [place] Agency: NA        Social Determinants of Health ([place]) Interventions     Readmission Risk Interventions Readmission Risk Prevention Plan 02/23/2019  Transportation Screening Complete  PCP or Specialist Appt within 5-7 Days Complete  Home Care Screening Not Complete  Home Care Screening Not Completed Comments NA  Medication Review (RN CM) Not Complete  Med Review comments NA  Some  recent data might be hidden

## 2019-02-28 NOTE — Progress Notes (Signed)
DISCHARGE NOTE SNF [facility] to be discharged Skilled nursing facility per MD order. Patient verbalized understanding.  Skin clean, dry and intact without evidence of skin break down, no evidence of skin tears noted. IV catheter discontinued intact. Site without signs and symptoms of complications. Dressing and pressure applied. Pt denies pain at the site currently. No complaints noted.  Patient free of lines, drains, and wounds.   Discharge packet assembled. An After Visit Summary (AVS) was printed and given to the EMS personnel. Patient escorted via stretcher and discharged to [facility] via ambulance. Report called to accepting facility; all questions and concerns addressed.   Paulla Fore, RN

## 2019-02-28 NOTE — Discharge Summary (Signed)
Physician Discharge Summary  Ann [name] DOB: [birth year] DOA: 02/20/2019  PCP: [name], MD  Admit date: 02/20/2019 Discharge date: 02/28/2019  Admitted From: Home  Disposition: SNF   Recommendations for Outpatient Follow-up:  1. Follow up with PCP in 1-2 weeks 2. Please obtain BMP/CBC in one week 3. Please follow up on the following pending results:  Home Health: No  Equipment/Devices: None   Discharge Condition: Stable CODE STATUS: Full  Diet recommendation: Heart Healthy / Carb Modified / Regular / Dysphagia   Brief/Interim Summary: This is a 68 year old morbidly obese African-American female who was initially admitted with acute encephalopathy thought to be due to pyelonephritis.  Tthe patient did receive appropriate IV antibiotics therapy however after a few days her urine culture came back negative.  For this reason the antibiotics were discontinued and the patient's sedating medications and pain medications were gradually either tapered off or discontinued with subsequent gradual improvement in the patient's encephalopathy.  Furthermore the patient's home blood pressure medications were continued throughout with relatively stable readings.  Once the patient's encephalopathy improved she was able to work a little more with physical and Occupational Therapy who then recommended SNF placement.  Discharge Diagnoses:   Pyelonephritis versus muscle spasm in a patient with known chronic back pain CT findings concerning for pyelonephritis on admission but urine cultures ultimately found to be negative. The patient has been afebrile with a normal WBC making infection less likely. She has been on empiric Rocephin. To address her chronic back pain, she was being treated with Neurontin and Flexeril. Sedating medicationsreduced 02/24/2019, and the patient had been seen and evaluated by PT with SNF recommended.Continued to fall asleep when being interviewed.  Discontinued Flexeril and reduced the dose of gabapentin  Acute toxic and metabolic encephalopathy; slowly improving Sedating medications decreased 02/24/2019 due to ongoing encephalopathy.Continues to appear to be encephalopathic so we discontinued Flexeril entirely. -Patient received IV ceftriaxone for possible urinary infection but then urine cultures came back negative thus the most likely culprit for her encephalopathy was medication-induced and it slowly improved after discontinuing Flexeril and reducing the dose of gabapentin  HYPERTENSION, uncontrolled Norvasc 10 mg daily, HCTZ 12.5 mg daily, metoprolol 50 mg twice daily, lisinopril 20 mg daily, isosorbide 30 mg daily and hydralazine 10 mg every 6 hours as needed  CAD (coronary artery disease) Continue Imdur, metoprolol.  CKD (chronic kidney disease), stage IV (HCC) Baseline creatinine 2.26.  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 [id number]. Hemoglobin A1c 8.1%.  Chronic pain Neurontin, dose reduced.Completely discontinued Flexeril.  Normocytic anemia Likely secondary to chronic kidney disease.  Hyperlipidemia Continue statin.  Obesity, class III Body mass index is 44.91 kg/m.  Constipation Add daily MiraLAX.  Discharge Instructions: Continue PT and OT, avoid sedating medications, continue to reorient as the patient's encephalopathy is still gradually improving.  Discharge Instructions    Diet - low sodium heart healthy   Complete by: As directed    Increase activity slowly   Complete by: As directed      Allergies as of 02/28/2019      Reactions   Codeine Nausea And Vomiting   Metronidazole Itching      Medication List    TAKE these medications   Accu-Chek FastClix Lancets Misc USE AS DIRECTED TO CHECK BLOOD SUGAR FOUR TIMES A DAY(BEFORE MEALS AND AT  BEDTIME).   Accu-Chek SmartView test strip Generic drug: glucose  blood CHECK BLOOD SUGAR BEFORE MEALS AND AT BEDTIME   amLODipine 10 MG tablet Commonly known as: NORVASC TAKE 1 TABLET BY MOUTH DAILY   aspirin EC 81 MG tablet Take 81 mg by mouth daily.   gabapentin 100 MG capsule Commonly known as: NEURONTIN Take 1 capsule (100 mg total) by mouth at bedtime. What changed: when to take this   insulin aspart 100 UNIT/ML injection Commonly known as: NovoLOG FlexPen Inject 3 units before each meal What changed:   how much to take  how to take this  when to take this  additional instructions   insulin detemir 100 UNIT/ML injection Commonly known as: LEVEMIR Inject 0.16 mLs (16 Units total) into the skin at bedtime. What changed: how much to take   Insulin Pen Needle 31G X 8 MM Misc Commonly known as: Easy Touch Pen Needles Use to inject insulin as instructed.   isosorbide mononitrate 30 MG 24 hr tablet Commonly known as: IMDUR TAKE 1 TABLET BY MOUTH ONCE DAILY   linagliptin 5 MG Tabs tablet Commonly known as: TRADJENTA Take 5 mg by mouth daily.   lisinopril-hydrochlorothiazide 20-12.5 MG tablet Commonly known as: ZESTORETIC TAKE 2 TABLETS BY MOUTH EVERY DAY What changed: how much to take   metoprolol tartrate 50 MG tablet Commonly known as: LOPRESSOR Take 50 mg by mouth 2 (two) times daily.   pantoprazole 20 MG tablet Commonly known as: PROTONIX TAKE 1 TABLET BY MOUTH DAILY   pravastatin 40 MG tablet Commonly known as: PRAVACHOL TAKE ONE TABLET BY MOUTH EVERY EVENING What changed: when to take this       Allergies  Allergen Reactions  . Codeine Nausea And Vomiting  . Metronidazole Itching    Consultations:  None   Procedures/Studies: Ct Renal Stone Study  Result Date: 02/20/2019 CLINICAL DATA:  68 year old female with right flank pain. Concern for kidney stone. EXAM: CT ABDOMEN AND PELVIS WITHOUT CONTRAST TECHNIQUE: Multidetector CT  imaging of the abdomen and pelvis was performed following the standard protocol without IV contrast. COMPARISON:  Renal ultrasound dated 09/03/2012 FINDINGS: Evaluation of this exam is limited in the absence of intravenous contrast. Lower chest: Left lung base linear atelectasis/scarring. The visualized lung bases are otherwise clear. Multi vessel coronary vascular calcifications noted. No intra-abdominal free air or free fluid. Hepatobiliary: The liver is unremarkable. No intrahepatic biliary ductal dilatation with cholecystectomy. No retained calcified stone noted in the central CBD. Pancreas: There is moderate fatty infiltration of the pancreas. No active inflammatory changes. Spleen: Normal in size without focal abnormality. Adrenals/Urinary Tract: The adrenal glands are unremarkable. Moderate to severe bilateral renal parenchyma atrophy. 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 There is no hydronephrosis or obstructing stone. A 2 mm nonobstructing stone versus vascular calcification in the upper pole of the right kidney. There is minimal haziness of the right renal sinus fat and parapelvic fat. The urinary bladder is minimally distended. There is diffuse thickened appearance of the bladder wall with mild perivesical haziness which may represent cystitis. Correlation with urinalysis recommended. Stomach/Bowel: There is a small hiatal hernia. There is colonic diverticulosis without active inflammatory changes. There is no bowel obstruction or active inflammation. The appendix is not visualized with certainty. No inflammatory changes identified in the right lower quadrant. Vascular/Lymphatic: Moderate aortoiliac atherosclerotic disease. The IVC is unremarkable. No portal venous gas. There is no adenopathy. Reproductive: Hysterectomy. No adnexal masses. Other: Midline vertical anterior pelvic wall incisional scar. There is a moderate midline ventral incisional fat containing hernia noted.  No fluid  collection. No bowel herniation. Musculoskeletal: Osteopenia with degenerative changes of the lower lumbar spine. No acute osseous pathology. IMPRESSION: 1. No hydronephrosis or obstructing stone. A 2 mm nonobstructing right renal upper pole stone versus vascular calcification. 2. Moderate bilateral renal parenchyma atrophy. There is findings concerning for UTI and possible right pyelonephritis. Correlation with urinalysis recommended. No fluid collection or abscess. 3. Colonic diverticulosis. No bowel obstruction or active inflammation. Aortic Atherosclerosis (ICD10-I70.0). Electronically Signed   By: Anner Crete M.D.   On: 02/20/2019 20:33   (Echo, Carotid, EGD, Colonoscopy, ERCP)    Subjective: No acute complaints today and the patient is excited that she will be discharged.  Discharge Exam: Vitals:   02/28/19 0515 02/28/19 1138  BP: (!) 160/79 (!) 160/82  Pulse: 75 68  Resp: 17   Temp: 98.3 F (36.8 C) 97.6 F (36.4 C)  SpO2: 100% 98%   Vitals:   02/27/19 2151 02/27/19 2153 02/28/19 0515 02/28/19 1138  BP: (!) 163/92  (!) 160/79 (!) 160/82  Pulse: 83  75 68  Resp: 17  17   Temp: 98 F (36.7 C)  98.3 F (36.8 C) 97.6 F (36.4 C)  TempSrc:    Oral  SpO2:  99% 100% 98%  Weight:      Height:        General exam: Appears calm and comfortable  Respiratory system: Clear to auscultation. Respiratory effort normal. Cardiovascular system: S1 & S2 heard, RRR. No JVD, murmurs, rubs, gallops or clicks. No pedal edema. Gastrointestinal system: Abdomen is nondistended, soft and nontender. No organomegaly or masses felt. Normal bowel sounds heard. Central nervous system: Alert and oriented x2 (self and place but not time) Extremities: Symmetric 5 x 5 power. Skin: No rashes, lesions or ulcers    The results of significant diagnostics from this hospitalization (including imaging, microbiology, ancillary and laboratory) are listed below for reference.     Microbiology: Recent  Results (from the past 240 hour(s))  Urine culture     Status: None   Collection Time: 02/20/19  7:00 PM   Specimen: Urine, Clean Catch  Result Value Ref Range Status   Specimen Description URINE, CLEAN CATCH  Final   Special Requests NONE  Final   Culture   Final    NO GROWTH Performed at [facility], [address], [place], [place] [address]    Report Status 02/21/2019 FINAL  Final  SARS CORONAVIRUS 2 (TAT 6-24 HRS) Nasopharyngeal Nasopharyngeal Swab     Status: None   Collection Time: 02/20/19  9:08 PM   Specimen: Nasopharyngeal Swab  Result Value Ref Range Status   SARS Coronavirus 2 NEGATIVE NEGATIVE Final    Comment: (NOTE) SARS-CoV-2 target nucleic acids are NOT DETECTED. The SARS-CoV-2 RNA is generally detectable in upper and lower respiratory specimens during the acute phase of infection. Negative results do not preclude SARS-CoV-2 infection, do not rule out co-infections with other pathogens, and should not be used as the sole basis for treatment or other patient management decisions. Negative results must be combined with clinical observations, patient history, and epidemiological information. The expected result is Negative. Fact Sheet for Patients: SugarRoll.be Fact Sheet for Healthcare Providers: https://www.woods-mathews.com/ This test is not yet approved or cleared by the [place] FDA and  has been authorized for detection and/or diagnosis of SARS-CoV-2 by FDA under an Emergency Use Authorization (EUA). This EUA will remain  in effect (meaning this test can be used) for the duration of the  COVID-19 declaration under Section 56 4(b)(1) of the Act, 21 U.S.C. section 360bbb-3(b)(1), unless the authorization is terminated or revoked sooner. Performed at [facility], [place] [address] [place], [place] [address]   SARS CORONAVIRUS 2 (TAT 6-24 HRS) Nasopharyngeal Nasopharyngeal Swab     Status: None    Collection Time: 02/26/19  1:23 PM   Specimen: Nasopharyngeal Swab  Result Value Ref Range Status   SARS Coronavirus 2 NEGATIVE NEGATIVE Final    Comment: (NOTE) SARS-CoV-2 target nucleic acids are NOT DETECTED. The SARS-CoV-2 RNA is generally detectable in upper and lower respiratory specimens during the acute phase of infection. Negative results do not preclude SARS-CoV-2 infection, do not rule out co-infections with other pathogens, and should not be used as the sole basis for treatment or other patient management decisions. Negative results must be combined with clinical observations, patient history, and epidemiological information. The expected result is Negative. Fact Sheet for Patients: SugarRoll.be Fact Sheet for Healthcare Providers: https://www.woods-mathews.com/ This test is not yet approved or cleared by the [place] FDA and  has been authorized for detection and/or diagnosis of SARS-CoV-2 by FDA under an Emergency Use Authorization (EUA). This EUA will remain  in effect (meaning this test can be used) for the duration of the COVID-19 declaration under Section 56 4(b)(1) of the Act, 21 U.S.C. section 360bbb-3(b)(1), unless the authorization is terminated or revoked sooner. Performed at [facility], [place] [address] [place], [place] [address]      Labs: BNP (last 3 results) No results for input(s): BNP in the last 8760 hours. Basic Metabolic Panel: Recent Labs  Lab 02/23/19 1108 02/25/19 0638 02/26/19 0417 02/27/19 0548  NA 140 140 138 140  K 5.0 4.8 5.1 5.0  CL 112* 114* 112* 117*  CO2 20* 18* 18* 15*  GLUCOSE 106* 134* 155* 70  BUN 22 20 24* 21  CREATININE 2.95* 2.78* 2.82* 2.64*  CALCIUM 8.8* 8.6* 8.6* 8.6*   Liver Function Tests: No results for input(s): AST, ALT, ALKPHOS, BILITOT, PROT, ALBUMIN in the last 168 hours. No results for input(s): LIPASE, AMYLASE in the last 168 hours. No results for  input(s): AMMONIA in the last 168 hours. CBC: Recent Labs  Lab 02/23/19 1108  WBC 5.6  NEUTROABS 3.2  HGB 9.5*  HCT 29.8*  MCV 88.7  PLT 157   Cardiac Enzymes: No results for input(s): CKTOTAL, CKMB, CKMBINDEX, TROPONINI in the last 168 hours. BNP: Invalid input(s): POCBNP CBG: Recent Labs  Lab 02/27/19 1124 02/27/19 1604 02/27/19 2128 02/28/19 0656 02/28/19 1150  GLUCAP 111* 155* 123* 70 109*   D-Dimer No results for input(s): DDIMER in the last 72 hours. Hgb A1c No results for input(s): HGBA1C in the last 72 hours. Lipid Profile No results for input(s): CHOL, HDL, LDLCALC, TRIG, CHOLHDL, LDLDIRECT in the last 72 hours. Thyroid function studies No results for input(s): TSH, T4TOTAL, T3FREE, THYROIDAB in the last 72 hours.  Invalid input(s): FREET3 Anemia work up No results for input(s): VITAMINB12, FOLATE, FERRITIN, TIBC, IRON, RETICCTPCT in the last 72 hours. Urinalysis    Component Value Date/Time   COLORURINE YELLOW 02/20/2019 1900   APPEARANCEUR CLOUDY (A) 02/20/2019 1900   LABSPEC 1.013 02/20/2019 1900   PHURINE 5.0 02/20/2019 1900   GLUCOSEU >=500 (A) 02/20/2019 1900   HGBUR SMALL (A) 02/20/2019 1900   BILIRUBINUR NEGATIVE 02/20/2019 1900   KETONESUR NEGATIVE 02/20/2019 1900   PROTEINUR >=300 (A) 02/20/2019 1900   UROBILINOGEN 0.2 11/21/2011 1213   NITRITE  NEGATIVE 02/20/2019 1900   LEUKOCYTESUR TRACE (A) 02/20/2019 1900   Sepsis Labs Invalid input(s): PROCALCITONIN,  WBC,  LACTICIDVEN Microbiology Recent Results (from the past 240 hour(s))  Urine culture     Status: None   Collection Time: 02/20/19  7:00 PM   Specimen: Urine, Clean Catch  Result Value Ref Range Status   Specimen Description URINE, CLEAN CATCH  Final   Special Requests NONE  Final   Culture   Final    NO GROWTH Performed at [facility], [address] [place], [place] [address]    Report Status 02/21/2019 FINAL  Final  SARS CORONAVIRUS 2 (TAT 6-24 HRS) Nasopharyngeal  Nasopharyngeal Swab     Status: None   Collection Time: 02/20/19  9:08 PM   Specimen: Nasopharyngeal Swab  Result Value Ref Range Status   SARS Coronavirus 2 NEGATIVE NEGATIVE Final    Comment: (NOTE) SARS-CoV-2 target nucleic acids are NOT DETECTED. The SARS-CoV-2 RNA is generally detectable in upper and lower respiratory specimens during the acute phase of infection. Negative results do not preclude SARS-CoV-2 infection, do not rule out co-infections with other pathogens, and should not be used as the sole basis for treatment or other patient management decisions. Negative results must be combined with clinical observations, patient history, and epidemiological information. The expected result is Negative. Fact Sheet for Patients: SugarRoll.be Fact Sheet for Healthcare Providers: https://www.woods-mathews.com/ This test is not yet approved or cleared by the [place] FDA and  has been authorized for detection and/or diagnosis of SARS-CoV-2 by FDA under an Emergency Use Authorization (EUA). This EUA will remain  in effect (meaning this test can be used) for the duration of the COVID-19 declaration under Section 56 4(b)(1) of the Act, 21 U.S.C. section 360bbb-3(b)(1), unless the authorization is terminated or revoked sooner. Performed at [facility], [place] [address] [place], [place] [address]   SARS CORONAVIRUS 2 (TAT 6-24 HRS) Nasopharyngeal Nasopharyngeal Swab     Status: None   Collection Time: 02/26/19  1:23 PM   Specimen: Nasopharyngeal Swab  Result Value Ref Range Status   SARS Coronavirus 2 NEGATIVE NEGATIVE Final    Comment: (NOTE) SARS-CoV-2 target nucleic acids are NOT DETECTED. The SARS-CoV-2 RNA is generally detectable in upper and lower respiratory specimens during the acute phase of infection. Negative results do not preclude SARS-CoV-2 infection, do not rule out co-infections with other pathogens, and should not  be used as the sole basis for treatment or other patient management decisions. Negative results must be combined with clinical observations, patient history, and epidemiological information. The expected result is Negative. Fact Sheet for Patients: SugarRoll.be Fact Sheet for Healthcare Providers: https://www.woods-mathews.com/ This test is not yet approved or cleared by the [place] FDA and  has been authorized for detection and/or diagnosis of SARS-CoV-2 by FDA under an Emergency Use Authorization (EUA). This EUA will remain  in effect (meaning this test can be used) for the duration of the COVID-19 declaration under Section 56 4(b)(1) of the Act, 21 U.S.C. section 360bbb-3(b)(1), unless the authorization is terminated or revoked sooner. Performed at [facility], [place] [address] [place], [place] [address]      Time coordinating discharge: Over 30 minutes  SIGNED:   Charolotte Capuchin, MD  [facility] 02/28/2019, 2:42 PM  If 7PM-7AM, please contact night-coverage [email or web address] Password [email or web address]

## 2019-02-28 NOTE — Progress Notes (Signed)
Notified MD Satsangi via secure message that the pt was very confused and that this was a change from her baseline mentation. Pt has been having periods of confusion since admission and also demonstrates some forgetfulness  Pt is able to follow commands but when asked what her name is she states "Ann [name]" and is unsure of her birth date. Pt also stated that she needed to go to the store and buy "stuff" and when asked where she was at she stated that she was a "[employer]" but was unable to tell us who Gershon Mussel was. Will continue to monitor pt as pt has periods of confusion.    MD responded that he is aware of pt's confusion and that pt would continue to gradually improve.  Paulla Fore, RN, BSN

## 2019-11-12 ENCOUNTER — Ambulatory Visit: Payer: Self-pay | Admitting: Podiatry

## 2020-01-19 ENCOUNTER — Encounter: Payer: Self-pay | Admitting: Gastroenterology

## 2020-02-10 ENCOUNTER — Other Ambulatory Visit: Payer: Self-pay | Admitting: Student

## 2020-02-10 DIAGNOSIS — Z1231 Encounter for screening mammogram for malignant neoplasm of breast: Secondary | ICD-10-CM

## 2020-02-19 ENCOUNTER — Other Ambulatory Visit: Payer: Self-pay

## 2020-02-19 DIAGNOSIS — N184 Chronic kidney disease, stage 4 (severe): Secondary | ICD-10-CM

## 2020-03-03 ENCOUNTER — Other Ambulatory Visit (HOSPITAL_COMMUNITY): Payer: Medicare Other

## 2020-03-03 ENCOUNTER — Encounter (HOSPITAL_COMMUNITY): Payer: Medicare Other

## 2020-03-03 ENCOUNTER — Encounter: Payer: Medicare Other | Admitting: Vascular Surgery

## 2020-04-03 ENCOUNTER — Encounter (HOSPITAL_COMMUNITY): Payer: Self-pay | Admitting: Internal Medicine

## 2020-04-03 ENCOUNTER — Emergency Department (HOSPITAL_COMMUNITY): Payer: Medicare Other

## 2020-04-03 ENCOUNTER — Other Ambulatory Visit: Payer: Self-pay

## 2020-04-03 ENCOUNTER — Inpatient Hospital Stay (HOSPITAL_COMMUNITY)
Admission: EM | Admit: 2020-04-03 | Discharge: 2020-04-09 | DRG: 177 | Disposition: A | Discharge status: Home Health | Payer: Medicare Other | Attending: Internal Medicine | Admitting: Internal Medicine

## 2020-04-03 DIAGNOSIS — Z888 Allergy status to other drugs, medicaments and biological substances status: Secondary | ICD-10-CM

## 2020-04-03 DIAGNOSIS — N390 Urinary tract infection, site not specified: Secondary | ICD-10-CM | POA: Diagnosis not present

## 2020-04-03 DIAGNOSIS — E860 Dehydration: Secondary | ICD-10-CM | POA: Diagnosis present

## 2020-04-03 DIAGNOSIS — E1165 Type 2 diabetes mellitus with hyperglycemia: Secondary | ICD-10-CM | POA: Diagnosis present

## 2020-04-03 DIAGNOSIS — N186 End stage renal disease: Secondary | ICD-10-CM | POA: Diagnosis present

## 2020-04-03 DIAGNOSIS — R531 Weakness: Secondary | ICD-10-CM | POA: Diagnosis not present

## 2020-04-03 DIAGNOSIS — Z8249 Family history of ischemic heart disease and other diseases of the circulatory system: Secondary | ICD-10-CM

## 2020-04-03 DIAGNOSIS — U071 COVID-19: Secondary | ICD-10-CM | POA: Diagnosis not present

## 2020-04-03 DIAGNOSIS — I10 Essential (primary) hypertension: Secondary | ICD-10-CM | POA: Diagnosis present

## 2020-04-03 DIAGNOSIS — N179 Acute kidney failure, unspecified: Secondary | ICD-10-CM | POA: Diagnosis present

## 2020-04-03 DIAGNOSIS — Z885 Allergy status to narcotic agent status: Secondary | ICD-10-CM

## 2020-04-03 DIAGNOSIS — Z720 Tobacco use: Secondary | ICD-10-CM

## 2020-04-03 DIAGNOSIS — E66813 Obesity, class 3: Secondary | ICD-10-CM | POA: Diagnosis present

## 2020-04-03 DIAGNOSIS — Z794 Long term (current) use of insulin: Secondary | ICD-10-CM

## 2020-04-03 DIAGNOSIS — Z6841 Body Mass Index (BMI) 40.0 and over, adult: Secondary | ICD-10-CM

## 2020-04-03 DIAGNOSIS — I493 Ventricular premature depolarization: Secondary | ICD-10-CM | POA: Diagnosis present

## 2020-04-03 DIAGNOSIS — Z955 Presence of coronary angioplasty implant and graft: Secondary | ICD-10-CM

## 2020-04-03 DIAGNOSIS — Z90710 Acquired absence of both cervix and uterus: Secondary | ICD-10-CM

## 2020-04-03 DIAGNOSIS — E785 Hyperlipidemia, unspecified: Secondary | ICD-10-CM | POA: Diagnosis present

## 2020-04-03 DIAGNOSIS — J1282 Pneumonia due to coronavirus disease 2019: Secondary | ICD-10-CM | POA: Diagnosis present

## 2020-04-03 DIAGNOSIS — E1122 Type 2 diabetes mellitus with diabetic chronic kidney disease: Secondary | ICD-10-CM | POA: Diagnosis present

## 2020-04-03 DIAGNOSIS — I129 Hypertensive chronic kidney disease with stage 1 through stage 4 chronic kidney disease, or unspecified chronic kidney disease: Secondary | ICD-10-CM | POA: Diagnosis present

## 2020-04-03 DIAGNOSIS — Z833 Family history of diabetes mellitus: Secondary | ICD-10-CM

## 2020-04-03 DIAGNOSIS — I2510 Atherosclerotic heart disease of native coronary artery without angina pectoris: Secondary | ICD-10-CM | POA: Diagnosis present

## 2020-04-03 DIAGNOSIS — N184 Chronic kidney disease, stage 4 (severe): Secondary | ICD-10-CM | POA: Diagnosis present

## 2020-04-03 DIAGNOSIS — K219 Gastro-esophageal reflux disease without esophagitis: Secondary | ICD-10-CM | POA: Diagnosis present

## 2020-04-03 DIAGNOSIS — J45909 Unspecified asthma, uncomplicated: Secondary | ICD-10-CM | POA: Diagnosis present

## 2020-04-03 DIAGNOSIS — I498 Other specified cardiac arrhythmias: Secondary | ICD-10-CM

## 2020-04-03 DIAGNOSIS — Z7982 Long term (current) use of aspirin: Secondary | ICD-10-CM

## 2020-04-03 DIAGNOSIS — Z79899 Other long term (current) drug therapy: Secondary | ICD-10-CM

## 2020-04-03 HISTORY — DX: Unspecified osteoarthritis, unspecified site: M19.90

## 2020-04-03 LAB — URINALYSIS, ROUTINE W REFLEX MICROSCOPIC
Bilirubin Urine: NEGATIVE
Glucose, UA: NEGATIVE mg/dL
Ketones, ur: NEGATIVE mg/dL
Leukocytes,Ua: NEGATIVE
Nitrite: NEGATIVE
Protein, ur: >=300 mg/dL — AB
Specific Gravity, Urine: 1.014 (ref 1.005–1.030)
pH: 5 (ref 5.0–8.0)

## 2020-04-03 LAB — TRIGLYCERIDES: Triglycerides: 216 mg/dL — ABNORMAL HIGH (ref <150)

## 2020-04-03 LAB — COMPREHENSIVE METABOLIC PANEL
ALT: 42 U/L (ref 0–44)
AST: 86 U/L — ABNORMAL HIGH (ref 15–41)
Albumin: 3.2 g/dL — ABNORMAL LOW (ref 3.5–5.0)
Alkaline Phosphatase: 61 U/L (ref 38–126)
Anion gap: 16 — ABNORMAL HIGH (ref 5–15)
BUN: 60 mg/dL — ABNORMAL HIGH (ref 8–23)
CO2: 21 mmol/L — ABNORMAL LOW (ref 22–32)
Calcium: 8.8 mg/dL — ABNORMAL LOW (ref 8.9–10.3)
Chloride: 103 mmol/L (ref 98–111)
Creatinine, Ser: 5.23 mg/dL — ABNORMAL HIGH (ref 0.44–1.00)
GFR, Estimated: 8 mL/min — ABNORMAL LOW (ref >60)
Glucose, Bld: 187 mg/dL — ABNORMAL HIGH (ref 70–99)
Potassium: 4.1 mmol/L (ref 3.5–5.1)
Sodium: 140 mmol/L (ref 135–145)
Total Bilirubin: 0.3 mg/dL (ref 0.3–1.2)
Total Protein: 7.7 g/dL (ref 6.5–8.1)

## 2020-04-03 LAB — C-REACTIVE PROTEIN: CRP: 16.1 mg/dL — ABNORMAL HIGH (ref <1.0)

## 2020-04-03 LAB — CBC
HCT: 34.4 % — ABNORMAL LOW (ref 36.0–46.0)
Hemoglobin: 11.2 g/dL — ABNORMAL LOW (ref 12.0–15.0)
MCH: 28.6 pg (ref 26.0–34.0)
MCHC: 32.6 g/dL (ref 30.0–36.0)
MCV: 87.8 fL (ref 80.0–100.0)
Platelets: 166 10*3/uL (ref 150–400)
RBC: 3.92 MIL/uL (ref 3.87–5.11)
RDW: 14.5 % (ref 11.5–15.5)
WBC: 6 10*3/uL (ref 4.0–10.5)
nRBC: 0 % (ref 0.0–0.2)

## 2020-04-03 LAB — RESP PANEL BY RT-PCR (FLU A&B, COVID) ARPGX2
Influenza A by PCR: NEGATIVE
Influenza B by PCR: NEGATIVE
SARS Coronavirus 2 by RT PCR: POSITIVE — AB

## 2020-04-03 LAB — LACTIC ACID, PLASMA: Lactic Acid, Venous: 1.2 mmol/L (ref 0.5–1.9)

## 2020-04-03 LAB — PROCALCITONIN: Procalcitonin: <0.1 ng/mL

## 2020-04-03 LAB — FERRITIN: Ferritin: 1088 ng/mL — ABNORMAL HIGH (ref 11–307)

## 2020-04-03 LAB — FIBRINOGEN: Fibrinogen: 627 mg/dL — ABNORMAL HIGH (ref 210–475)

## 2020-04-03 LAB — LACTATE DEHYDROGENASE: LDH: 346 U/L — ABNORMAL HIGH (ref 98–192)

## 2020-04-03 MED ORDER — IOHEXOL 9 MG/ML PO SOLN
500.0000 mL | ORAL | Status: AC
Start: 2020-04-03 — End: 2020-04-03
  Administered 2020-04-03: 500 mL via ORAL

## 2020-04-03 MED ORDER — METHYLPREDNISOLONE SODIUM SUCC 125 MG IJ SOLR
0.5000 mg/kg | Freq: Two times a day (BID) | INTRAMUSCULAR | Status: DC
  Administered 2020-04-04: 48.125 mg via INTRAVENOUS
  Filled 2020-04-03: qty 2

## 2020-04-03 MED ORDER — LINAGLIPTIN 5 MG PO TABS
5.0000 mg | ORAL_TABLET | Freq: Every day | ORAL | Status: DC
  Administered 2020-04-04: 5 mg via ORAL
  Filled 2020-04-03 (×2): qty 1

## 2020-04-03 MED ORDER — ASCORBIC ACID 500 MG PO TABS
500.0000 mg | ORAL_TABLET | Freq: Every day | ORAL | Status: DC
  Administered 2020-04-04 – 2020-04-09 (×6): 500 mg via ORAL
  Filled 2020-04-03 (×6): qty 1

## 2020-04-03 MED ORDER — INSULIN ASPART 100 UNIT/ML ~~LOC~~ SOLN
0.0000 [IU] | SUBCUTANEOUS | Status: DC
  Administered 2020-04-04: 3 [IU] via SUBCUTANEOUS
  Administered 2020-04-04: 4 [IU] via SUBCUTANEOUS
  Administered 2020-04-04: 3 [IU] via SUBCUTANEOUS
  Administered 2020-04-04 (×2): 7 [IU] via SUBCUTANEOUS
  Administered 2020-04-05 (×2): 4 [IU] via SUBCUTANEOUS
  Administered 2020-04-05: 3 [IU] via SUBCUTANEOUS
  Filled 2020-04-03: qty 0.2

## 2020-04-03 MED ORDER — INSULIN DETEMIR 100 UNIT/ML ~~LOC~~ SOLN
0.1000 [IU]/kg | Freq: Two times a day (BID) | SUBCUTANEOUS | Status: DC
  Administered 2020-04-04 – 2020-04-07 (×7): 10 [IU] via SUBCUTANEOUS
  Filled 2020-04-03 (×8): qty 0.1

## 2020-04-03 MED ORDER — GUAIFENESIN-DM 100-10 MG/5ML PO SYRP
10.0000 mL | ORAL_SOLUTION | ORAL | Status: DC | PRN
  Administered 2020-04-07: 10 mL via ORAL
  Filled 2020-04-03: qty 10

## 2020-04-03 MED ORDER — IOHEXOL 9 MG/ML PO SOLN
ORAL | Status: AC
  Filled 2020-04-03: qty 1000

## 2020-04-03 MED ORDER — ZINC SULFATE 220 (50 ZN) MG PO CAPS
220.0000 mg | ORAL_CAPSULE | Freq: Every day | ORAL | Status: DC
  Administered 2020-04-04 – 2020-04-09 (×6): 220 mg via ORAL
  Filled 2020-04-03 (×6): qty 1

## 2020-04-03 MED ORDER — SODIUM CHLORIDE 0.9 % IV SOLN
1.0000 g | Freq: Once | INTRAVENOUS | Status: AC
  Administered 2020-04-03: 1 g via INTRAVENOUS
  Filled 2020-04-03: qty 10

## 2020-04-03 MED ORDER — SODIUM CHLORIDE 0.9 % IV BOLUS
500.0000 mL | Freq: Once | INTRAVENOUS | Status: AC
  Administered 2020-04-03: 500 mL via INTRAVENOUS

## 2020-04-03 MED ORDER — ACETAMINOPHEN 650 MG RE SUPP: 650.0000 mg | Freq: Four times a day (QID) | RECTAL | Status: DC | PRN

## 2020-04-03 MED ORDER — SODIUM CHLORIDE 0.9 % IV SOLN
100.0000 mg | Freq: Every day | INTRAVENOUS | Status: AC
  Administered 2020-04-04 – 2020-04-07 (×4): 100 mg via INTRAVENOUS
  Filled 2020-04-03: qty 100
  Filled 2020-04-03 (×6): qty 20

## 2020-04-03 MED ORDER — ONDANSETRON HCL 4 MG PO TABS: 4.0000 mg | ORAL_TABLET | Freq: Four times a day (QID) | ORAL | Status: DC | PRN

## 2020-04-03 MED ORDER — SODIUM CHLORIDE 0.9 % IV SOLN
500.0000 mg | Freq: Once | INTRAVENOUS | Status: AC
  Administered 2020-04-03: 500 mg via INTRAVENOUS
  Filled 2020-04-03: qty 500

## 2020-04-03 MED ORDER — SODIUM CHLORIDE 0.9 % IV SOLN: INTRAVENOUS | Status: DC

## 2020-04-03 MED ORDER — SODIUM CHLORIDE 0.9 % IV SOLN
200.0000 mg | Freq: Once | INTRAVENOUS | Status: AC
  Administered 2020-04-04: 200 mg via INTRAVENOUS
  Filled 2020-04-03: qty 40

## 2020-04-03 MED ORDER — PREDNISONE 50 MG PO TABS: 50.0000 mg | ORAL_TABLET | Freq: Every day | ORAL | Status: DC

## 2020-04-03 MED ORDER — ONDANSETRON HCL 4 MG/2ML IJ SOLN
4.0000 mg | Freq: Four times a day (QID) | INTRAMUSCULAR | Status: DC | PRN
  Administered 2020-04-08: 4 mg via INTRAVENOUS
  Filled 2020-04-03: qty 2

## 2020-04-03 MED ORDER — ACETAMINOPHEN 325 MG PO TABS: 650.0000 mg | ORAL_TABLET | Freq: Four times a day (QID) | ORAL | Status: DC | PRN

## 2020-04-03 MED ORDER — ENOXAPARIN SODIUM 40 MG/0.4ML ~~LOC~~ SOLN
40.0000 mg | SUBCUTANEOUS | Status: DC
  Administered 2020-04-03: 40 mg via SUBCUTANEOUS
  Filled 2020-04-03: qty 0.4

## 2020-04-03 MED ORDER — SODIUM CHLORIDE 0.9 % IV BOLUS
500.0000 mL | Freq: Once | INTRAVENOUS | Status: AC
  Administered 2020-04-04: 500 mL via INTRAVENOUS

## 2020-04-03 NOTE — H&P (Signed)
History and Physical    Ann [name] TMH:[id number] DOB: 04/20/1951 DOA: 04/03/2020  PCP: [name], MD   Patient coming from: Home.  I have personally briefly reviewed patient's old medical records in [place]  Chief Complaint: Diarrhea and legs weakness.  HPI: Ann [name] is a 69 y.o. female with medical history significant of anemia of chronic disease, CAD, asthma, stage III CKD, GERD, hyperlipidemia, hypertension, type 2 diabetes, chronic lumbar back pain, myalgia, left shoulder pain, sleep apnea on CPAP who is coming to the emergency department with complaints of onset of multiple episodes of diarrhea associated with nausea, epigastric abdominal pain today (denies emesis, constipation, melena or hematochezia), nonproductive cough, chills, subjective fever, fatigue and generalized sleeping since Monday. No dysuria, frequency or hematuria.  She denies headache, rhinorrhea, chest pain, palpitations, PND, orthopnea or recent pitting edema of the lower extremities.  She has felt lightheaded.  No polyuria, polydipsia, polyphagia or blurred vision.  She was exposed to a family member who has tested positive for COVID-19.   ED Course: Initial vital signs are temperature 98.7 F, pulse 66, respirations 21, BP 142/85 mmHg O2 sat 95% on room air.  The patient received a 500 mL bolus, azithromycin and ceftriaxone in the emergency department.  Labwork: Urinalysis show small hemoglobinuria, proteinuria more than 300 mg/dL in and rare bacteria.  CBC showed a white count of 6.0, hemoglobin 11.2 g/dL platelets 166.  CMP shows CO2 of 21 mmol/L with an anion gap was 16.  All other electrolytes are within normal limits when calcium is corrected to albumin.  Glucose 187, BUN 60 and creatinine 5.23 mg/dL.  LFTs showed the following abnormalities: Total protein 7.7, albumin 3.2 g/dL and AST 86 units/L.  Lactic acid was normal.  Fibrinogen was 627, triglycerides 216 and CRP 16.1 mg/dL.  LDH 346  units/L.  Ferritin was 1000 ADA and procalcitonin was less than 0.10 ng/mL.  Imaging: A 1 view chest radiograph show subtle heterogeneous airspace opacity in the right lung base CT head shows age advanced cerebral atrophy, ventriculomegaly and mild periventricular white matter disease.  There was no acute intracranial findings or mass lesions.  CT abdomen/pelvis did not show any acute abdominal/pelvic abnormality.  Please see images and full radiology report for further detail.  Review of Systems: As per HPI otherwise all other systems reviewed and are negative.  Past Medical History:  Diagnosis Date  . Anemia 09/29/2008   anemia of chronic disease  . Anginal pain ([place])    no recent chest pain  . Asthma 09/29/2008  . CAD (coronary artery disease) 09/29/2008   hx  PCI - [facility]. Stress test 10/2011 nl with EF 58%. [place] card., last cath 2010  . Chest pain 02/16/2009  . Chronic kidney disease (CKD), stage III (moderate) 10/20/2009  . GERD (gastroesophageal reflux disease)   . Hyperlipidemia 10/21/2009  . Hypertension 09/29/2008  . Insulin dependent diabetes mellitus type IA ([place]) 09/29/2008  . Lumbar back pain 02/16/2009   states pain goes down right leg  . Myalgia 02/16/2009  . Shortness of breath    with exerction  . Shoulder pain, left 03/16/2009   occurs at night  . Sleep apnea    CPAP    Past Surgical History:  Procedure Laterality Date  . ABDOMINAL HYSTERECTOMY    . APPENDECTOMY    . CARDIAC CATHETERIZATION  2008   in [place], [place] stable  . CARDIAC CATHETERIZATION  2010   patent stents in mid and  distal RCA; high grade disease in prox & mid AV groove LCX& OM; mod disease in prox & mid LAD normal LV FUNCTION--TREATED MEDICALLY    . CATARACT EXTRACTION, BILATERAL    . CHOLECYSTECTOMY    . CORONARY ANGIOPLASTY WITH STENT PLACEMENT  2005   2 vessel PCI at [facility].  [place]   EF 60%  . TOTAL KNEE ARTHROPLASTY  12/2011   Left  . TOTAL  KNEE ARTHROPLASTY  11/28/2011   Procedure: TOTAL KNEE ARTHROPLASTY;  Surgeon: Hessie Dibble, MD;  Location: [facility];  Service: Orthopedics;  Laterality: Right;    Social History  reports that she has never smoked. Her smokeless tobacco use includes snuff. She reports that she does not drink alcohol and does not use drugs.  Allergies  Allergen Reactions  . Codeine Nausea And Vomiting  . Metronidazole Itching    Family History  Problem Relation Age of Onset  . Heart attack Father   . Heart disease Sister   . Heart disease Brother   . Diabetes Mother   . Hypertension Mother   . Heart disease Mother   . Heart attack Mother   . Stroke Mother   . Colon cancer Neg Hx    Prior to Admission medications   Medication Sig Start Date End Date Taking? Authorizing Provider  ACCU-CHEK FASTCLIX LANCETS MISC USE AS DIRECTED TO CHECK BLOOD SUGAR FOUR TIMES A DAY(BEFORE MEALS AND AT BEDTIME). Patient taking differently: 1 each by Other route 4 (four) times daily. 10/14/12  Yes Sid Falcon, MD  ACCU-CHEK SMARTVIEW test strip CHECK BLOOD SUGAR BEFORE MEALS AND AT BEDTIME Patient taking differently: 1 each by Other route 4 (four) times daily -  before meals and at bedtime. 10/14/12  Yes Sid Falcon, MD  amLODipine (NORVASC) 10 MG tablet TAKE 1 TABLET BY MOUTH DAILY Patient taking differently: Take 10 mg by mouth daily. 02/01/12  Yes Neta Ehlers, MD  aspirin EC 81 MG tablet Take 81 mg by mouth daily.   Yes [provider]  DULoxetine (CYMBALTA) 60 MG capsule Take 60 mg by mouth daily. 03/22/20  Yes [provider]  furosemide (LASIX) 40 MG tablet Take 40 mg by mouth daily. 01/14/20  Yes [provider]  gabapentin (NEURONTIN) 100 MG capsule Take 1 capsule (100 mg total) by mouth at bedtime. 02/28/19  Yes Satsangi, Jabier Gauss, MD  HUMALOG KWIKPEN 100 UNIT/ML KwikPen Inject 10 Units into the skin 3 (three) times daily. 02/27/20  Yes [provider]  hydrALAZINE  (APRESOLINE) 50 MG tablet Take 50 mg by mouth 3 (three) times daily. 01/14/20  Yes [provider]  Insulin Pen Needle (EASY TOUCH PEN NEEDLES) 31G X 8 MM MISC Use to inject insulin as instructed. 02/02/12  Yes Bartholomew Crews, MD  isosorbide mononitrate (IMDUR) 30 MG 24 hr tablet TAKE 1 TABLET BY MOUTH ONCE DAILY Patient taking differently: Take 30 mg by mouth daily. 11/04/14  Yes Lorretta Harp, MD  linagliptin (TRADJENTA) 5 MG TABS tablet Take 5 mg by mouth daily.   Yes [provider]  lisinopril-hydrochlorothiazide (PRINZIDE,ZESTORETIC) 20-12.5 MG per tablet TAKE 2 [place] Patient taking differently: Take 1 tablet by mouth daily. 02/05/12  Yes Dominic Pea, DO  metoprolol (LOPRESSOR) 50 MG tablet Take 50 mg by mouth 2 (two) times daily.  02/05/12  Yes Paya, Cletus Gash, DO  pantoprazole (PROTONIX) 20 MG tablet TAKE 1 TABLET BY MOUTH DAILY Patient taking differently:  Take 20 mg by mouth daily. 02/05/12  Yes Paya, Cletus Gash, DO  pravastatin (PRAVACHOL) 40 MG tablet TAKE ONE TABLET BY MOUTH EVERY EVENING Patient taking differently: Take 40 mg by mouth at bedtime. 06/28/12  Yes Neta Ehlers, MD  rOPINIRole (REQUIP) 0.25 MG tablet Take 0.25 mg by mouth every evening. 02/27/20  Yes [provider]  sodium bicarbonate 650 MG tablet Take 650 mg by mouth 2 (two) times daily. 03/22/20  Yes [provider]  TOUJEO SOLOSTAR 300 UNIT/ML Solostar Pen Inject 10 Units into the skin at bedtime. 02/27/20  Yes [provider]  insulin aspart (NOVOLOG FLEXPEN) 100 UNIT/ML injection Inject 3 units before each meal Patient not taking: Reported on 04/03/2020 01/17/12   Ansel Bong, MD  insulin detemir (LEVEMIR) 100 UNIT/ML injection Inject 0.16 mLs (16 Units total) into the skin at bedtime. Patient not taking: Reported on 04/03/2020 02/28/19   Charolotte Capuchin, MD   Physical Exam: Vitals:   04/03/20 1945 04/03/20 2000 04/03/20 2015 04/03/20  2030  BP:  (!) 144/99  (!) 147/74  Pulse: 64 69 65 67  Resp: 20 18 20  (!) 24  Temp:      TempSrc:      SpO2: 99% 97% 97% 99%   Constitutional: Febrile, looks acutely ill. Eyes: PERRL, lids and conjunctivae injected. ENMT: Mucous membranes and lips are dry. Posterior pharynx clear of any exudate or lesions. Neck: normal, supple, no masses, no thyromegaly Respiratory: Decreased breath sounds with bibasilar crackles, no wheezing.  Normal respiratory effort. No accessory muscle use.  Cardiovascular: Regular rate with occasional 2:1 irregular rhythm, no murmurs / rubs / gallops. No extremity edema. 2+ pedal pulses. No carotid bruits.  Abdomen: Obese, no distention.  Bowel sounds positive.  Soft, positive for epigastric tenderness, no guarding or rebound, no masses palpated. No hepatosplenomegaly. Musculoskeletal: no clubbing / cyanosis. No joint deformity upper and lower extremities. Good ROM, no contractures. Normal muscle tone.  Skin: no rashes, lesions, ulcers on very limited dermatological examination. Neurologic: CN 2-12 grossly intact. Sensation intact, DTR normal. Strength 5/5 in all 4.  Psychiatric: Normal judgment and insight. Alert and oriented x 3. Normal mood.   Labs on Admission: I have personally reviewed following labs and imaging studies  CBC: Recent Labs  Lab 04/03/20 1455  WBC 6.0  HGB 11.2*  HCT 34.4*  MCV 87.8  PLT 643   Basic Metabolic Panel: Recent Labs  Lab 04/03/20 1455  NA 140  K 4.1  CL 103  CO2 21*  GLUCOSE 187*  BUN 60*  CREATININE 5.23*  CALCIUM 8.8*   GFR: CrCl cannot be calculated (Unknown ideal weight.).  Liver Function Tests: Recent Labs  Lab 04/03/20 1455  AST 86*  ALT 42  ALKPHOS 61  BILITOT 0.3  PROT 7.7  ALBUMIN 3.2*   Urine analysis:    Component Value Date/Time   COLORURINE AMBER (A) 04/03/2020 1455   APPEARANCEUR CLOUDY (A) 04/03/2020 1455   LABSPEC 1.014 04/03/2020 1455   PHURINE 5.0 04/03/2020 1455   GLUCOSEU  NEGATIVE 04/03/2020 1455   HGBUR SMALL (A) 04/03/2020 1455   BILIRUBINUR NEGATIVE 04/03/2020 1455   KETONESUR NEGATIVE 04/03/2020 1455   PROTEINUR >=300 (A) 04/03/2020 1455   UROBILINOGEN 0.2 11/21/2011 1213   NITRITE NEGATIVE 04/03/2020 1455   LEUKOCYTESUR NEGATIVE 04/03/2020 1455   Radiological Exams on Admission: CT ABDOMEN PELVIS WO CONTRAST  Result Date: 04/03/2020 CLINICAL DATA:  Abdominal pain. EXAM: CT ABDOMEN AND PELVIS WITHOUT CONTRAST TECHNIQUE: Multidetector CT imaging  of the abdomen and pelvis was performed following the standard protocol without IV contrast. COMPARISON:  02/20/2019 FINDINGS: Lower chest: Patchy ground-glass infiltrates in the right lower lobe could be due to developing pneumonia, atypical pneumonia or acute aspiration. No pleural effusions. The heart is normal in size. No pericardial effusion. Three-vessel coronary artery calcifications are noted. Small hiatal hernia. Hepatobiliary: No hepatic lesions are identified without contrast. No intrahepatic biliary dilatation. The gallbladder is surgically absent. No common bile duct dilatation. Pancreas: Marked fatty atrophy of the pancreas. No mass, inflammation or ductal dilatation. Spleen: Normal size.  No focal lesions. Adrenals/Urinary Tract: The adrenal glands are unremarkable. Small, chronically atrophied kidneys consistent with chronic renal failure. No worrisome renal lesions or hydroureteronephrosis. The bladder is unremarkable. Stomach/Bowel: The stomach, duodenum, small bowel and colon are unremarkable. No acute inflammatory changes, mass lesions or obstructive findings. The terminal ileum is normal. Scattered colonic diverticulosis but no findings for acute diverticulitis. Vascular/Lymphatic: Significant diffuse vascular calcifications. No aneurysm. No mesenteric or retroperitoneal mass or adenopathy. No free air or free fluid. No leaking oral contrast. Reproductive: The uterus is surgically absent. Both ovaries are  still present and appear normal. Other: Moderate-sized periumbilical abdominal wall hernia containing fat. Musculoskeletal: No significant bony findings. IMPRESSION: 1. No acute abdominal/pelvic findings, mass lesions or adenopathy. 2. Patchy ground-glass infiltrates in the right lower lobe could be due to developing pneumonia, atypical pneumonia or acute aspiration. 3. Status post cholecystectomy. No biliary dilatation. 4. Small, chronically atrophied kidneys consistent with chronic renal failure. 5. Moderate-sized periumbilical abdominal wall hernia containing fat. 6. Aortic atherosclerosis. Aortic Atherosclerosis (ICD10-I70.0). Electronically Signed   By: Marijo Sanes M.D.   On: 04/03/2020 19:19   CT Head Wo Contrast  Result Date: 04/03/2020 CLINICAL DATA:  Sudden onset of leg weakness today. EXAM: CT HEAD WITHOUT CONTRAST TECHNIQUE: Contiguous axial images were obtained from the base of the skull through the vertex without intravenous contrast. COMPARISON:  None. FINDINGS: Brain: Age advanced cerebral atrophy, ventriculomegaly and mild periventricular white matter disease. No extra-axial fluid collections are identified. No CT findings for acute hemispheric infarction or intracranial hemorrhage. No mass lesions. The brainstem and cerebellum are normal. Vascular: Moderate to advanced vascular calcifications but no aneurysm or hyperdense vessels. Skull: No skull fracture or bone lesions. Sinuses/Orbits: The paranasal sinuses and mastoid air cells are clear except for a 2 cm mucous retention cyst or polyp in the left maxillary sinus. The globes are intact. Other: No scalp lesions or scalp hematoma. IMPRESSION: 1. Age advanced cerebral atrophy, ventriculomegaly and mild periventricular white matter disease. 2. No acute intracranial findings or mass lesions. Electronically Signed   By: Marijo Sanes M.D.   On: 04/03/2020 19:14   DG Chest Port 1 View  Result Date: 04/03/2020 CLINICAL DATA:  Right lower lobe  infiltrate EXAM: PORTABLE CHEST 1 VIEW COMPARISON:  Same day CT abdomen pelvis, 04/03/2020, chest radiographs, 04/05/2014 FINDINGS: The heart size and mediastinal contours are within normal limits. Subtle heterogeneous airspace opacity of the right lung base. The visualized skeletal structures are unremarkable. IMPRESSION: Subtle heterogeneous airspace opacity of the right lung base, in keeping with findings of prior CT and consistent with infection or aspiration. Electronically Signed   By: Eddie Candle M.D.   On: 04/03/2020 20:23    EKG: Independently reviewed.  EKG #1 Vent. rate 68 BPM PR interval * ms QRS duration 163 ms QT/QTc 510/543 ms P-R-T axes * 52 55 Not AF. Left bundle branch block Artifact  EKG #2 Vent. rate  112 BPM PR interval * ms QRS duration 162 ms QT/QTc 434/593 ms P-R-T axes 71 47 -60 Second degree AV block, Mobitz II IVCD, consider atypical LBBB  Not sure if this is Mobitz type 2. Telemetry monitor shows 2:1 pattern.   Assessment/Plan Principal Problem:   2019 novel coronavirus disease (COVID-19) Observation/telemetry. Continue supplemental oxygen. Bronchodilators as needed. Flutter valve/incentive spirometry Gentle time-limited IV hydration. Procalcitonin is less than 0.10 ng/mL. Will hold azithromycin and ceftriaxone. Acetaminophen as needed for fever/pain. Antiemetics/antitussives as needed. Remdesivir per pharmacy. Solu-Medrol 0.5 mg/kg every 12 hours. COVID-19 CBG monitoring with RI SS protocol.  Active Problems:   AKI (acute kidney injury) ([place]) on   CKD (chronic kidney disease), stage IV (HCC) Repeat NS bolus 500 mg then, Normal saline 100 mL/h x 10 hours. Monitor BUN and creatinine. Given Covid pneumonia, will rehydrate slowly. Hold ACE inhibitor and diuretics. Avoid nephrotoxic medications.    AV node arrhythmia No clear pattern, but suspect Mobitz type arrhythmia. Discussed with cardiology on-call. No chest pain or syncopal  episodes. Has been lightheaded, but is dehydrated. Hydrate slowly and judiciously. Hold metoprolol. Keep electrolytes optimized. Check echocardiogram. Continue close cardiac telemetry monitoring. Transfer to SDU if symptomatic or moderate to severe bradycardic. Consult cardiology in the morning.    CAD (coronary artery disease) Continue aspirin, Imdur and statin. Hold beta-blocker.    HYPERTENSION Hold furosemide, lisinopril and hydrochlorothiazide. Continue amlodipine 10 mg p.o. daily. Code hydralazine 50 mg p.o. 3 times daily. Also on Imdur 30 mg p.o. daily    Uncontrolled type II diabetes mellitus with hyperglycemia (HCC) Carbohydrate modified diet. Check hemoglobin A1c. COVID-19 hyperglycemia protocol. CBG monitoring with RI SS every 4 hrs.    Hyperlipidemia Continue pravastatin 40 mg p.o. daily. Follow-up with PCP.    Obesity, Class II, BMI 38.63 kg/m (HCC) Significant weight loss since last visit. Continue lifestyle modifications. Follow-up closely with PCP.    GERD (gastroesophageal reflux disease) Continue Protonix 20 mg p.o. daily.    DVT prophylaxis: Lovenox SQ. Code Status:   Full code. Family Communication:   Disposition Plan:   Patient is from:  Home.  Anticipated DC to:  Home.  Anticipated DC date:  04/05/2020.  Anticipated DC barriers: Clinical status.  Consults called:   Admission status:  Observation/telemetry.   Severity of Illness: Very high due to age, medical history with COVID-19 pneumonia associated with AKI and AV node dysfunction.  Reubin Milan MD [facility]  How to contact the [facility] Attending or Consulting provider [place] or covering provider during after hours [place], for this patient?   1. Check the care team in [facility] and look for a) attending/consulting [facility] provider listed and b) the [facility] team listed 2. Log into [email or web address] and use [place]'s universal password to access. If you do not have the password, please  contact the hospital operator. 3. Locate the [facility] provider you are looking for under [facility] and page to a number that you can be directly reached. 4. If you still have difficulty reaching the provider, please page the [facility] (Director on Call) for the Hospitalists listed on amion for assistance.  04/03/2020, 9:34 PM   This document was prepared using Dragon voice recognition software and may contain some unintended transcription errors.

## 2020-04-03 NOTE — ED Triage Notes (Signed)
Transported by [facility] from home-- sudden onset of diarrhea x 1 and leg weakness. Hx of HTN, DM and C Diff. AAO x 4; vital signs stable. Denies n/v or abdominal pain.

## 2020-04-03 NOTE — ED Provider Notes (Signed)
[facility] DEPT Provider Note   CSN: [id number] Arrival date & time: 04/03/20  1331     History Chief Complaint  Patient presents with  . Weakness    Ann [name] is a 69 y.o. female.  HPI Level 5 caveat   69 year old female with report of cough, chills, and feeling hot for several days with associated generalized weakness to day.  Increased sleeping.  Symptoms present off and on since Monday.  Patient endorses loose stools today.   Discussed with daughter who states that patient has had cough all week and had increased weakness.  Past Medical History:  Diagnosis Date  . Anemia 09/29/2008   anemia of chronic disease  . Anginal pain ([place])    no recent chest pain  . Asthma 09/29/2008  . CAD (coronary artery disease) 09/29/2008   hx  PCI - [facility]. Stress test 10/2011 nl with EF 58%. [facility] card., last cath 2010  . Chest pain 02/16/2009  . Chronic kidney disease (CKD), stage III (moderate) 10/20/2009  . GERD (gastroesophageal reflux disease)   . Hyperlipidemia 10/21/2009  . Hypertension 09/29/2008  . Insulin dependent diabetes mellitus type IA ([place]) 09/29/2008  . Lumbar back pain 02/16/2009   states pain goes down right leg  . Myalgia 02/16/2009  . Shortness of breath    with exerction  . Shoulder pain, left 03/16/2009   occurs at night  . Sleep apnea    CPAP    Patient Active Problem List   Diagnosis Date Noted  . Constipation 02/25/2019  . Pyelonephritis 02/20/2019  . Acute encephalopathy 02/20/2019  . Chronic pain 02/20/2019  . Obesity, Class III, BMI 40-49.9 (morbid obesity) ([place]) 01/28/2013  . Uncontrolled diabetes mellitus with hyperglycemia ([place]) 03/25/2012  . Health care maintenance 03/25/2012  . Acute blood loss anemia 12/01/2011    Class: Acute  . 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 Plantar fasciitis of right foot 09/28/2011  . Breast pain 03/08/2011  . Right knee DJD  02/10/2011  . Knee pain, right 01/20/2011  . Thoracic spine pain 12/13/2010  . DYSLIPIDEMIA 10/21/2009  . CKD (chronic kidney disease), stage IV ([place]) 10/20/2009  . UNSPECIFIED ANEMIA 09/29/2008  . HYPERTENSION 09/29/2008  . CAD (coronary artery disease) 09/29/2008  . ASTHMA, UNSPECIFIED, UNSPECIFIED STATUS 09/29/2008    Past Surgical History:  Procedure Laterality Date  . ABDOMINAL HYSTERECTOMY    . APPENDECTOMY    . CARDIAC CATHETERIZATION  2008   in [place], [place] stable  . CARDIAC CATHETERIZATION  2010   patent stents in mid and distal RCA; high grade disease in prox & mid AV groove LCX& OM; mod disease in prox & mid LAD normal LV FUNCTION--TREATED MEDICALLY    . CATARACT EXTRACTION, BILATERAL    . CHOLECYSTECTOMY    . CORONARY ANGIOPLASTY WITH STENT PLACEMENT  2005   2 vessel PCI at [facility].  [place]   EF 60%  . TOTAL KNEE ARTHROPLASTY  12/2011   Left  . TOTAL KNEE ARTHROPLASTY  11/28/2011   Procedure: TOTAL KNEE ARTHROPLASTY;  Surgeon: Hessie Dibble, MD;  Location: [facility];  Service: Orthopedics;  Laterality: Right;     OB History   No obstetric history on file.     Family History  Problem Relation Age of Onset  . Heart attack Father   . Heart disease Sister   . Heart disease Brother   . Diabetes Mother   .  Hypertension Mother   . Heart disease Mother   . Heart attack Mother   . Stroke Mother   . Colon cancer Neg Hx     Social History   Tobacco Use  . Smoking status: Never Smoker  . Smokeless tobacco: Current User    Types: Snuff  . Tobacco comment: using "all my life"  Substance Use Topics  . Alcohol use: No    Alcohol/week: 0.0 standard drinks  . Drug use: No    Home Medications Prior to Admission medications   Medication Sig Start Date End Date Taking? Authorizing Provider  ACCU-CHEK FASTCLIX LANCETS MISC USE AS DIRECTED TO CHECK BLOOD SUGAR FOUR TIMES A DAY(BEFORE MEALS AND AT BEDTIME). Patient taking differently: 1  each by Other route 4 (four) times daily. 10/14/12  Yes Sid Falcon, MD  ACCU-CHEK SMARTVIEW test strip CHECK BLOOD SUGAR BEFORE MEALS AND AT BEDTIME Patient taking differently: 1 each by Other route 4 (four) times daily -  before meals and at bedtime. 10/14/12  Yes Sid Falcon, MD  amLODipine (NORVASC) 10 MG tablet TAKE 1 TABLET BY MOUTH DAILY Patient taking differently: Take 10 mg by mouth daily. 02/01/12  Yes Neta Ehlers, MD  aspirin EC 81 MG tablet Take 81 mg by mouth daily.   Yes [provider]  DULoxetine (CYMBALTA) 60 MG capsule Take 60 mg by mouth daily. 03/22/20  Yes [provider]  furosemide (LASIX) 40 MG tablet Take 40 mg by mouth daily. 01/14/20  Yes [provider]  gabapentin (NEURONTIN) 100 MG capsule Take 1 capsule (100 mg total) by mouth at bedtime. 02/28/19  Yes Satsangi, Jabier Gauss, MD  HUMALOG KWIKPEN 100 UNIT/ML KwikPen Inject 10 Units into the skin 3 (three) times daily. 02/27/20  Yes [provider]  hydrALAZINE (APRESOLINE) 50 MG tablet Take 50 mg by mouth 3 (three) times daily. 01/14/20  Yes [provider]  Insulin Pen Needle (EASY TOUCH PEN NEEDLES) 31G X 8 MM MISC Use to inject insulin as instructed. 02/02/12  Yes Bartholomew Crews, MD  isosorbide mononitrate (IMDUR) 30 MG 24 hr tablet TAKE 1 TABLET BY MOUTH ONCE DAILY Patient taking differently: Take 30 mg by mouth daily. 11/04/14  Yes Lorretta Harp, MD  linagliptin (TRADJENTA) 5 MG TABS tablet Take 5 mg by mouth daily.   Yes [provider]  lisinopril-hydrochlorothiazide (PRINZIDE,ZESTORETIC) 20-12.5 MG per tablet TAKE 2 [place] Patient taking differently: Take 1 tablet by mouth daily. 02/05/12  Yes Dominic Pea, DO  metoprolol (LOPRESSOR) 50 MG tablet Take 50 mg by mouth 2 (two) times daily.  02/05/12  Yes Paya, Cletus Gash, DO  pantoprazole (PROTONIX) 20 MG tablet TAKE 1 TABLET BY MOUTH DAILY Patient taking differently: Take 20 mg  by mouth daily. 02/05/12  Yes Paya, Cletus Gash, DO  pravastatin (PRAVACHOL) 40 MG tablet TAKE ONE TABLET BY MOUTH EVERY EVENING Patient taking differently: Take 40 mg by mouth at bedtime. 06/28/12  Yes Neta Ehlers, MD  rOPINIRole (REQUIP) 0.25 MG tablet Take 0.25 mg by mouth every evening. 02/27/20  Yes [provider]  sodium bicarbonate 650 MG tablet Take 650 mg by mouth 2 (two) times daily. 03/22/20  Yes [provider]  TOUJEO SOLOSTAR 300 UNIT/ML Solostar Pen Inject 10 Units into the skin at bedtime. 02/27/20  Yes [provider]  insulin aspart (NOVOLOG FLEXPEN) 100 UNIT/ML injection Inject 3 units before each meal Patient not taking: Reported on 04/03/2020 01/17/12   Julius Bowels  T, MD  insulin detemir (LEVEMIR) 100 UNIT/ML injection Inject 0.16 mLs (16 Units total) into the skin at bedtime. Patient not taking: Reported on 04/03/2020 02/28/19   Charolotte Capuchin, MD    Allergies    Codeine and Metronidazole  Review of Systems   Review of Systems  Physical Exam Updated Vital Signs BP (!) 153/77   Pulse (!) 124   Temp 98.7 F (37.1 C) (Oral)   Resp 10   SpO2 96%   Physical Exam Vitals and nursing note reviewed.  Constitutional:      General: She is not in acute distress.    Appearance: Normal appearance. She is obese.  HENT:     Head: Normocephalic.     Right Ear: External ear normal.     Left Ear: External ear normal.     Nose: Nose normal.     Mouth/Throat:     Mouth: Mucous membranes are dry.  Eyes:     Extraocular Movements: Extraocular movements intact.     Pupils: Pupils are equal, round, and reactive to light.  Cardiovascular:     Rate and Rhythm: Regular rhythm. Tachycardia present.  Pulmonary:     Effort: Pulmonary effort is normal.     Breath sounds: Normal breath sounds.  Abdominal:     General: Abdomen is flat.     Tenderness: There is abdominal tenderness.     Comments: Diffuse bilateral lower quadrant tenderness to palpation   Musculoskeletal:     Cervical back: Normal range of motion.  Skin:    General: Skin is warm and dry.     Capillary Refill: Capillary refill takes less than 2 seconds.  Neurological:     General: No focal deficit present.     Mental Status: She is alert.  Psychiatric:        Mood and Affect: Mood normal.     ED Results / Procedures / Treatments   Labs (all labs ordered are listed, but only abnormal results are displayed) Labs Reviewed  CBC - Abnormal; Notable for the following components:      Result Value   Hemoglobin 11.2 (*)    HCT 34.4 (*)    All other components within normal limits  COMPREHENSIVE METABOLIC PANEL - Abnormal; Notable for the following components:   CO2 21 (*)    Glucose, Bld 187 (*)    BUN 60 (*)    Creatinine, Ser 5.23 (*)    Calcium 8.8 (*)    Albumin 3.2 (*)    AST 86 (*)    GFR, Estimated 8 (*)    Anion gap 16 (*)    All other components within normal limits  CULTURE, BLOOD (ROUTINE X 2)  CULTURE, BLOOD (ROUTINE X 2)  RESP PANEL BY RT-PCR (FLU A&B, COVID) ARPGX2  URINALYSIS, ROUTINE W REFLEX MICROSCOPIC    EKG EKG Interpretation  Date/Time:  Saturday April 03 2020 14:25:04 EST Ventricular Rate:  68 PR Interval:    QRS Duration: 163 QT Interval:  510 QTC Calculation: 543 R Axis:   52 Text Interpretation: Atrial fibrillation Left bundle branch block Confirmed by Pattricia Boss [phone]) on 04/03/2020 8:21:33 PM   Radiology CT ABDOMEN PELVIS WO CONTRAST  Result Date: 04/03/2020 CLINICAL DATA:  Abdominal pain. EXAM: CT ABDOMEN AND PELVIS WITHOUT CONTRAST TECHNIQUE: Multidetector CT imaging of the abdomen and pelvis was performed following the standard protocol without IV contrast. COMPARISON:  02/20/2019 FINDINGS: Lower chest: Patchy ground-glass infiltrates in the right lower lobe could be due  to developing pneumonia, atypical pneumonia or acute aspiration. No pleural effusions. The heart is normal in size. No pericardial effusion.  Three-vessel coronary artery calcifications are noted. Small hiatal hernia. Hepatobiliary: No hepatic lesions are identified without contrast. No intrahepatic biliary dilatation. The gallbladder is surgically absent. No common bile duct dilatation. Pancreas: Marked fatty atrophy of the pancreas. No mass, inflammation or ductal dilatation. Spleen: Normal size.  No focal lesions. Adrenals/Urinary Tract: The adrenal glands are unremarkable. Small, chronically atrophied kidneys consistent with chronic renal failure. No worrisome renal lesions or hydroureteronephrosis. The bladder is unremarkable. Stomach/Bowel: The stomach, duodenum, small bowel and colon are unremarkable. No acute inflammatory changes, mass lesions or obstructive findings. The terminal ileum is normal. Scattered colonic diverticulosis but no findings for acute diverticulitis. Vascular/Lymphatic: Significant diffuse vascular calcifications. No aneurysm. No mesenteric or retroperitoneal mass or adenopathy. No free air or free fluid. No leaking oral contrast. Reproductive: The uterus is surgically absent. Both ovaries are still present and appear normal. Other: Moderate-sized periumbilical abdominal wall hernia containing fat. Musculoskeletal: No significant bony findings. IMPRESSION: 1. No acute abdominal/pelvic findings, mass lesions or adenopathy. 2. Patchy ground-glass infiltrates in the right lower lobe could be due to developing pneumonia, atypical pneumonia or acute aspiration. 3. Status post cholecystectomy. No biliary dilatation. 4. Small, chronically atrophied kidneys consistent with chronic renal failure. 5. Moderate-sized periumbilical abdominal wall hernia containing fat. 6. Aortic atherosclerosis. Aortic Atherosclerosis (ICD10-I70.0). Electronically Signed   By: Marijo Sanes M.D.   On: 04/03/2020 19:19   CT Head Wo Contrast  Result Date: 04/03/2020 CLINICAL DATA:  Sudden onset of leg weakness today. EXAM: CT HEAD WITHOUT CONTRAST  TECHNIQUE: Contiguous axial images were obtained from the base of the skull through the vertex without intravenous contrast. COMPARISON:  None. FINDINGS: Brain: Age advanced cerebral atrophy, ventriculomegaly and mild periventricular white matter disease. No extra-axial fluid collections are identified. No CT findings for acute hemispheric infarction or intracranial hemorrhage. No mass lesions. The brainstem and cerebellum are normal. Vascular: Moderate to advanced vascular calcifications but no aneurysm or hyperdense vessels. Skull: No skull fracture or bone lesions. Sinuses/Orbits: The paranasal sinuses and mastoid air cells are clear except for a 2 cm mucous retention cyst or polyp in the left maxillary sinus. The globes are intact. Other: No scalp lesions or scalp hematoma. IMPRESSION: 1. Age advanced cerebral atrophy, ventriculomegaly and mild periventricular white matter disease. 2. No acute intracranial findings or mass lesions. Electronically Signed   By: Marijo Sanes M.D.   On: 04/03/2020 19:14    Procedures .Critical Care Performed by: Pattricia Boss, MD Authorized by: Pattricia Boss, MD   Critical care provider statement:    Critical care time (minutes):  45   Critical care end time:  04/03/2020 8:25 PM   Critical care was necessary to treat or prevent imminent or life-threatening deterioration of the following conditions:  Dehydration   Critical care was time spent personally by me on the following activities:  Discussions with consultants, evaluation of patient's response to treatment, examination of patient, ordering and performing treatments and interventions, ordering and review of laboratory studies, ordering and review of radiographic studies, pulse oximetry, re-evaluation of patient's condition, obtaining history from patient or surrogate and review of old charts   (including critical care time)  Medications Ordered in ED Medications  iohexol (OMNIPAQUE) 9 MG/ML oral solution 500  mL (500 mLs Oral Contrast Given 04/03/20 1642)  iohexol (OMNIPAQUE) 9 MG/ML oral solution (has no administration in time range)  sodium chloride 0.9 %  bolus 500 mL (500 mLs Intravenous New Bag/Given 04/03/20 1540)    ED Course  I have reviewed the triage vital signs and the nursing notes.  Pertinent labs & imaging results that were available during my care of the patient were reviewed by me and considered in my medical decision making (see chart for details).    MDM Rules/Calculators/A&P                          69 yo female with ho hypertension, diabetes, CKD and anemia who presents today with generalized weakness and nonspecific infectious symptoms. Patient's creatinine is increased from last of 2.64-5.23. There are no acute electrolyte derangements associated with this. CBC shows mild anemia of 11.2 g of hemoglobin. Patient with worsening renal failure. I discussed with her daughter who states that her primary care has discussed that she may need dialysis in the future. However, daughter is unable to tell me what her creatinines have been over the past year or who the patient goes to. Suspect that she has some acute failure on top of CKD and will give some IV fluids. Plan admission for ongoing treatment and evaluation. Patient is Covid positive.  Additional labs added Discussed with Dr. Olevia Bowens who will see for admission Final Clinical Impression(s) / ED Diagnoses Final diagnoses:  COVID  Weakness  AKI (acute kidney injury) [facility])    Rx / [place] Orders ED Discharge Orders    None       Pattricia Boss, MD 04/03/20 2026

## 2020-04-03 NOTE — ED Notes (Signed)
Per CT tech, pt will be ready to go to CT appx 1845 after drinking oral contrast.

## 2020-04-04 ENCOUNTER — Inpatient Hospital Stay (HOSPITAL_COMMUNITY): Payer: Medicare Other

## 2020-04-04 ENCOUNTER — Encounter (HOSPITAL_COMMUNITY): Payer: Self-pay | Admitting: Internal Medicine

## 2020-04-04 DIAGNOSIS — I129 Hypertensive chronic kidney disease with stage 1 through stage 4 chronic kidney disease, or unspecified chronic kidney disease: Secondary | ICD-10-CM | POA: Diagnosis present

## 2020-04-04 DIAGNOSIS — K219 Gastro-esophageal reflux disease without esophagitis: Secondary | ICD-10-CM

## 2020-04-04 DIAGNOSIS — Z7982 Long term (current) use of aspirin: Secondary | ICD-10-CM | POA: Diagnosis not present

## 2020-04-04 DIAGNOSIS — N184 Chronic kidney disease, stage 4 (severe): Secondary | ICD-10-CM | POA: Diagnosis present

## 2020-04-04 DIAGNOSIS — Z955 Presence of coronary angioplasty implant and graft: Secondary | ICD-10-CM | POA: Diagnosis not present

## 2020-04-04 DIAGNOSIS — E1122 Type 2 diabetes mellitus with diabetic chronic kidney disease: Secondary | ICD-10-CM | POA: Diagnosis present

## 2020-04-04 DIAGNOSIS — E1165 Type 2 diabetes mellitus with hyperglycemia: Secondary | ICD-10-CM

## 2020-04-04 DIAGNOSIS — U071 COVID-19: Principal | ICD-10-CM

## 2020-04-04 DIAGNOSIS — E860 Dehydration: Secondary | ICD-10-CM | POA: Diagnosis present

## 2020-04-04 DIAGNOSIS — Z6841 Body Mass Index (BMI) 40.0 and over, adult: Secondary | ICD-10-CM | POA: Diagnosis not present

## 2020-04-04 DIAGNOSIS — I498 Other specified cardiac arrhythmias: Secondary | ICD-10-CM | POA: Diagnosis not present

## 2020-04-04 DIAGNOSIS — J45909 Unspecified asthma, uncomplicated: Secondary | ICD-10-CM | POA: Diagnosis present

## 2020-04-04 DIAGNOSIS — E785 Hyperlipidemia, unspecified: Secondary | ICD-10-CM | POA: Diagnosis present

## 2020-04-04 DIAGNOSIS — R531 Weakness: Secondary | ICD-10-CM | POA: Diagnosis present

## 2020-04-04 DIAGNOSIS — I493 Ventricular premature depolarization: Secondary | ICD-10-CM | POA: Diagnosis present

## 2020-04-04 DIAGNOSIS — Z885 Allergy status to narcotic agent status: Secondary | ICD-10-CM | POA: Diagnosis not present

## 2020-04-04 DIAGNOSIS — N179 Acute kidney failure, unspecified: Secondary | ICD-10-CM | POA: Diagnosis present

## 2020-04-04 DIAGNOSIS — R9431 Abnormal electrocardiogram [ECG] [EKG]: Secondary | ICD-10-CM

## 2020-04-04 DIAGNOSIS — Z720 Tobacco use: Secondary | ICD-10-CM | POA: Diagnosis not present

## 2020-04-04 DIAGNOSIS — I2510 Atherosclerotic heart disease of native coronary artery without angina pectoris: Secondary | ICD-10-CM | POA: Diagnosis present

## 2020-04-04 DIAGNOSIS — Z794 Long term (current) use of insulin: Secondary | ICD-10-CM | POA: Diagnosis not present

## 2020-04-04 DIAGNOSIS — Z79899 Other long term (current) drug therapy: Secondary | ICD-10-CM | POA: Diagnosis not present

## 2020-04-04 DIAGNOSIS — I10 Essential (primary) hypertension: Secondary | ICD-10-CM | POA: Diagnosis not present

## 2020-04-04 DIAGNOSIS — Z90710 Acquired absence of both cervix and uterus: Secondary | ICD-10-CM | POA: Diagnosis not present

## 2020-04-04 DIAGNOSIS — J1282 Pneumonia due to coronavirus disease 2019: Secondary | ICD-10-CM | POA: Diagnosis present

## 2020-04-04 DIAGNOSIS — Z888 Allergy status to other drugs, medicaments and biological substances status: Secondary | ICD-10-CM | POA: Diagnosis not present

## 2020-04-04 DIAGNOSIS — N390 Urinary tract infection, site not specified: Secondary | ICD-10-CM | POA: Diagnosis not present

## 2020-04-04 LAB — ECHOCARDIOGRAM COMPLETE
Area-P 1/2: 2.48 cm2
Height: 62 in
S' Lateral: 4.1 cm
Weight: 3379.21 oz

## 2020-04-04 LAB — CBC WITH DIFFERENTIAL/PLATELET
Abs Immature Granulocytes: 0.02 10*3/uL (ref 0.00–0.07)
Basophils Absolute: 0 10*3/uL (ref 0.0–0.1)
Basophils Relative: 0 %
Eosinophils Absolute: 0 10*3/uL (ref 0.0–0.5)
Eosinophils Relative: 0 %
HCT: 30.5 % — ABNORMAL LOW (ref 36.0–46.0)
Hemoglobin: 9.8 g/dL — ABNORMAL LOW (ref 12.0–15.0)
Immature Granulocytes: 1 %
Lymphocytes Relative: 30 %
Lymphs Abs: 1.2 10*3/uL (ref 0.7–4.0)
MCH: 28.5 pg (ref 26.0–34.0)
MCHC: 32.1 g/dL (ref 30.0–36.0)
MCV: 88.7 fL (ref 80.0–100.0)
Monocytes Absolute: 0.5 10*3/uL (ref 0.1–1.0)
Monocytes Relative: 12 %
Neutro Abs: 2.3 10*3/uL (ref 1.7–7.7)
Neutrophils Relative %: 57 %
Platelets: 142 10*3/uL — ABNORMAL LOW (ref 150–400)
RBC: 3.44 MIL/uL — ABNORMAL LOW (ref 3.87–5.11)
RDW: 14.5 % (ref 11.5–15.5)
WBC: 4 10*3/uL (ref 4.0–10.5)
nRBC: 0 % (ref 0.0–0.2)

## 2020-04-04 LAB — HIV ANTIBODY (ROUTINE TESTING W REFLEX)
HIV Screen 4th Generation wRfx: NONREACTIVE
HIV Screen 4th Generation wRfx: NONREACTIVE

## 2020-04-04 LAB — COMPREHENSIVE METABOLIC PANEL
ALT: 36 U/L (ref 0–44)
AST: 79 U/L — ABNORMAL HIGH (ref 15–41)
Albumin: 2.7 g/dL — ABNORMAL LOW (ref 3.5–5.0)
Alkaline Phosphatase: 49 U/L (ref 38–126)
Anion gap: 16 — ABNORMAL HIGH (ref 5–15)
BUN: 61 mg/dL — ABNORMAL HIGH (ref 8–23)
CO2: 20 mmol/L — ABNORMAL LOW (ref 22–32)
Calcium: 8.3 mg/dL — ABNORMAL LOW (ref 8.9–10.3)
Chloride: 105 mmol/L (ref 98–111)
Creatinine, Ser: 5.38 mg/dL — ABNORMAL HIGH (ref 0.44–1.00)
GFR, Estimated: 8 mL/min — ABNORMAL LOW (ref >60)
Glucose, Bld: 123 mg/dL — ABNORMAL HIGH (ref 70–99)
Potassium: 3.6 mmol/L (ref 3.5–5.1)
Sodium: 141 mmol/L (ref 135–145)
Total Bilirubin: 0.6 mg/dL (ref 0.3–1.2)
Total Protein: 6.4 g/dL — ABNORMAL LOW (ref 6.5–8.1)

## 2020-04-04 LAB — LACTIC ACID, PLASMA: Lactic Acid, Venous: 1 mmol/L (ref 0.5–1.9)

## 2020-04-04 LAB — SODIUM, URINE, RANDOM: Sodium, Ur: 35 mmol/L

## 2020-04-04 LAB — GLUCOSE, CAPILLARY
Glucose-Capillary: 128 mg/dL — ABNORMAL HIGH (ref 70–99)
Glucose-Capillary: 143 mg/dL — ABNORMAL HIGH (ref 70–99)
Glucose-Capillary: 134 mg/dL — ABNORMAL HIGH (ref 70–99)
Glucose-Capillary: 162 mg/dL — ABNORMAL HIGH (ref 70–99)
Glucose-Capillary: 218 mg/dL — ABNORMAL HIGH (ref 70–99)
Glucose-Capillary: 215 mg/dL — ABNORMAL HIGH (ref 70–99)
Glucose-Capillary: 166 mg/dL — ABNORMAL HIGH (ref 70–99)

## 2020-04-04 LAB — HEMOGLOBIN A1C
Hgb A1c MFr Bld: 6.6 % — ABNORMAL HIGH (ref 4.8–5.6)
Mean Plasma Glucose: 142.72 mg/dL

## 2020-04-04 LAB — C-REACTIVE PROTEIN: CRP: 14.5 mg/dL — ABNORMAL HIGH (ref <1.0)

## 2020-04-04 LAB — FERRITIN: Ferritin: 974 ng/mL — ABNORMAL HIGH (ref 11–307)

## 2020-04-04 LAB — MAGNESIUM: Magnesium: 2.2 mg/dL (ref 1.7–2.4)

## 2020-04-04 LAB — D-DIMER, QUANTITATIVE: D-Dimer, Quant: 3.4 ug/mL-FEU — ABNORMAL HIGH (ref 0.00–0.50)

## 2020-04-04 LAB — PHOSPHORUS: Phosphorus: 5.7 mg/dL — ABNORMAL HIGH (ref 2.5–4.6)

## 2020-04-04 MED ORDER — HYDRALAZINE HCL 50 MG PO TABS
50.0000 mg | ORAL_TABLET | Freq: Three times a day (TID) | ORAL | Status: DC
  Administered 2020-04-04 – 2020-04-09 (×16): 50 mg via ORAL
  Filled 2020-04-04 (×15): qty 1

## 2020-04-04 MED ORDER — GABAPENTIN 100 MG PO CAPS
100.0000 mg | ORAL_CAPSULE | Freq: Every day | ORAL | Status: DC
  Administered 2020-04-04 – 2020-04-08 (×5): 100 mg via ORAL
  Filled 2020-04-04 (×5): qty 1

## 2020-04-04 MED ORDER — ISOSORBIDE MONONITRATE ER 30 MG PO TB24
30.0000 mg | ORAL_TABLET | Freq: Every day | ORAL | Status: DC
  Administered 2020-04-04 – 2020-04-09 (×6): 30 mg via ORAL
  Filled 2020-04-04 (×6): qty 1

## 2020-04-04 MED ORDER — AMLODIPINE BESYLATE 10 MG PO TABS
10.0000 mg | ORAL_TABLET | Freq: Every day | ORAL | Status: DC
  Administered 2020-04-04 – 2020-04-09 (×6): 10 mg via ORAL
  Filled 2020-04-04 (×6): qty 1

## 2020-04-04 MED ORDER — PRAVASTATIN SODIUM 20 MG PO TABS
40.0000 mg | ORAL_TABLET | Freq: Every day | ORAL | Status: DC
  Administered 2020-04-04 – 2020-04-08 (×5): 40 mg via ORAL
  Filled 2020-04-04 (×5): qty 2

## 2020-04-04 MED ORDER — METHYLPREDNISOLONE SODIUM SUCC 125 MG IJ SOLR: 60.0000 mg | Freq: Two times a day (BID) | INTRAMUSCULAR | Status: DC

## 2020-04-04 MED ORDER — DULOXETINE HCL 60 MG PO CPEP
60.0000 mg | ORAL_CAPSULE | Freq: Every day | ORAL | Status: DC
  Administered 2020-04-04 – 2020-04-09 (×6): 60 mg via ORAL
  Filled 2020-04-04 (×6): qty 1

## 2020-04-04 MED ORDER — PERFLUTREN LIPID MICROSPHERE
1.0000 mL | INTRAVENOUS | Status: DC | PRN
Start: 2020-04-04 — End: 2020-04-04
  Administered 2020-04-04: 2 mL via INTRAVENOUS
  Filled 2020-04-04: qty 10

## 2020-04-04 MED ORDER — HEPARIN SODIUM (PORCINE) 10000 UNIT/ML IJ SOLN
7500.0000 [IU] | Freq: Three times a day (TID) | INTRAMUSCULAR | Status: DC
  Administered 2020-04-04 – 2020-04-09 (×14): 7500 [IU] via SUBCUTANEOUS
  Filled 2020-04-04 (×15): qty 1

## 2020-04-04 MED ORDER — SODIUM CHLORIDE 0.9 % IV SOLN: INTRAVENOUS | Status: DC

## 2020-04-04 MED ORDER — SODIUM BICARBONATE 650 MG PO TABS
650.0000 mg | ORAL_TABLET | Freq: Two times a day (BID) | ORAL | Status: DC
  Administered 2020-04-04 – 2020-04-09 (×11): 650 mg via ORAL
  Filled 2020-04-04 (×11): qty 1

## 2020-04-04 MED ORDER — ROPINIROLE HCL 0.25 MG PO TABS
0.2500 mg | ORAL_TABLET | Freq: Every evening | ORAL | Status: DC
Start: 2020-04-04 — End: 2020-04-09
  Administered 2020-04-04 – 2020-04-08 (×5): 0.25 mg via ORAL
  Filled 2020-04-04 (×6): qty 1

## 2020-04-04 MED ORDER — ASPIRIN EC 81 MG PO TBEC
81.0000 mg | DELAYED_RELEASE_TABLET | Freq: Every day | ORAL | Status: DC
  Administered 2020-04-04 – 2020-04-09 (×6): 81 mg via ORAL
  Filled 2020-04-04 (×6): qty 1

## 2020-04-04 MED ORDER — PANTOPRAZOLE SODIUM 20 MG PO TBEC
20.0000 mg | DELAYED_RELEASE_TABLET | Freq: Every day | ORAL | Status: DC
  Administered 2020-04-04 – 2020-04-09 (×6): 20 mg via ORAL
  Filled 2020-04-04 (×6): qty 1

## 2020-04-04 MED ORDER — LINAGLIPTIN 5 MG PO TABS
5.0000 mg | ORAL_TABLET | Freq: Every day | ORAL | Status: DC
  Administered 2020-04-04 – 2020-04-06 (×3): 5 mg via ORAL
  Filled 2020-04-04 (×3): qty 1

## 2020-04-04 NOTE — Plan of Care (Signed)
  Problem: Education: Goal: Knowledge of General Education information will improve Description Including pain rating scale, medication(s)/side effects and non-pharmacologic comfort measures Outcome: Progressing   

## 2020-04-04 NOTE — Progress Notes (Signed)
  Echocardiogram 2D Echocardiogram has been performed.  Fidel Levy 04/04/2020, 11:11 AM

## 2020-04-04 NOTE — Progress Notes (Signed)
PROGRESS NOTE  Ann [name]  JGO:[id number] DOB: 08-Feb-1951 DOA: 04/03/2020 PCP: [name], MD  Outpatient Specialists: Dr. Hollie Salk, nephrology Brief Narrative: Ann [name] is a 69 y.o. female with with a history of stage IV CKD with associated anemia, CAD, T2DM, HTN, HLD, GERD, asthma, OSA on CPAP, chronic lumbar back pain who presented with nausea, diarrhea, fever, chills, cough, and severe fatigue worsening since exposure to family member recently discharged for covid-19 pneumonia. In the ED 12/11, she was felt to be dehydrated with Cr up to 5.23 from baseline in 2's, bicarbonate 21, BUN 60. AST 86. CRP 16.1, PCT negative. SARS-CoV-2 PCR confirmed positivity and CXR demonstrated mild airspace opacity at the right base. She was given IV fluids, antibiotics which were later stopped, and remdesivir for covid-19 pneumonia and admitted for AKI with nephrology consulting.  Assessment & Plan: Principal Problem:   2019 novel coronavirus disease (COVID-19) Active Problems:   Hyperlipidemia   HYPERTENSION   CAD (coronary artery disease)   CKD (chronic kidney disease), stage IV (HCC)   Uncontrolled type II diabetes mellitus with hyperglycemia (HCC)   Obesity, Class III, BMI 40-49.9 (morbid obesity) (HCC)   GERD (gastroesophageal reflux disease)   AV node arrhythmia   AKI (acute kidney injury) ([place])   Acute renal failure (HCC)  AKI on stage IV CKD: Follows with Dr. Hollie Salk, planning vein mapping and access placement 04/14/2020. No current indications for dialysis but has not improved since admission.  - Nephrology consulted, agree with continuing IV fluids and monitoring UOP and respiratory status very closely.  - Renal U/S and urine lytes pending.   Covid-19 pneumonia: SARS-CoV-2 PCR positive on 12/11. Initially put on abx, though discontinued with positive covid swab and negative PCT. - Will continue remdesivir  - DC steroids since not hypoxic. Inflammatory marker grossly elevated, so would  have low threshold to start steroids if hypoxia developed.  - IS - Continue airborne, contact precautions for 21 days from positive testing. - Intermediate dose heparin ordered given renal failure and severity of inflammation/suspected hypercoagulability   T2DM: HbA1c 6.6% - SSI   CAD: No chest pain. Cardiology consulted for telemetry abnormalities felt to be insignificant.  - Continue ASA, statin, imdur.  HTN:  - Holding RAS agent and diuretics - Continue hydralazine, imdur  HLD:  - Continue statin  GERD:  - Continue PPI  Morbid obesity: Estimated body mass index is 38.63 kg/m as calculated from the following:   Height as of this encounter: 5\' 2"  (1.575 m).   Weight as of this encounter: 95.8 kg.  DVT prophylaxis: Heparin 7,500 units q8h Code Status: Full Family Communication: Declined offer to call Disposition Plan:  Status is: Inpatient  Remains inpatient appropriate because:Persistent severe electrolyte disturbances, Ongoing diagnostic testing needed not appropriate for outpatient work up and Inpatient level of care appropriate due to severity of illness  Dispo: The patient is from: Home              Anticipated d/c is to: TBD              Anticipated d/c date is: 3 days              Patient currently is not medically stable to d/c.  Consultants:   Nephrology  Cardiology, Dr. Radford Pax  Procedures:   None  Antimicrobials:  Ceftriaxone, azithromycin 12/11  Remdesivir 12/11 >>    Subjective: Feels diffusely weak but no nausea or diarrhea currently. No dyspnea reported at time, is getting  renal U/S.  Objective: Vitals:   04/04/20 0314 04/04/20 0655 04/04/20 1104 04/04/20 1438  BP: (!) 167/79 (!) 146/78 139/72 139/83  Pulse: 77 80 83 90  Resp: 15 (!) 23 16 16   Temp: 98.9 F (37.2 C) 98.2 F (36.8 C) (!) 97.5 F (36.4 C) (!) 97.5 F (36.4 C)  TempSrc:  Oral    SpO2: 92% 97% 93% 94%  Weight:      Height:        Intake/Output Summary (Last 24  hours) at 04/04/2020 1633 Last data filed at 04/04/2020 1500 Gross per 24 hour  Intake 1080 ml  Output -  Net 1080 ml   Filed Weights   04/03/20 2257  Weight: 95.8 kg    Gen: 69 y.o. female in no distress Pulm: Non-labored breathing. Clear and diminished to auscultation bilaterally.  CV: Regular rate and rhythm. No murmur, rub, or gallop. No definite JVD, no pitting pedal edema. GI: Abdomen soft, non-tender, non-distended, with normoactive bowel sounds. No organomegaly or masses felt. Ext: Warm, no deformities Skin: No rashes, lesions or ulcers on visualized skin Neuro: Alert and oriented. No focal neurological deficits. Psych: Judgement and insight appear normal. Mood & affect appropriate.   Data Reviewed: I have personally reviewed following labs and imaging studies  CBC: Recent Labs  Lab 04/03/20 1455 04/04/20 0046  WBC 6.0 4.0  NEUTROABS  --  2.3  HGB 11.2* 9.8*  HCT 34.4* 30.5*  MCV 87.8 88.7  PLT 166 580*   Basic Metabolic Panel: Recent Labs  Lab 04/03/20 1455 04/04/20 0046  NA 140 141  K 4.1 3.6  CL 103 105  CO2 21* 20*  GLUCOSE 187* 123*  BUN 60* 61*  CREATININE 5.23* 5.38*  CALCIUM 8.8* 8.3*  MG  --  2.2  PHOS  --  5.7*   GFR: Estimated Creatinine Clearance: 10.5 mL/min (A) (by C-G formula based on SCr of 5.38 mg/dL (H)). Liver Function Tests: Recent Labs  Lab 04/03/20 1455 04/04/20 0046  AST 86* 79*  ALT 42 36  ALKPHOS 61 49  BILITOT 0.3 0.6  PROT 7.7 6.4*  ALBUMIN 3.2* 2.7*   No results for input(s): LIPASE, AMYLASE in the last 168 hours. No results for input(s): AMMONIA in the last 168 hours. Coagulation Profile: No results for input(s): INR, PROTIME in the last 168 hours. Cardiac Enzymes: No results for input(s): CKTOTAL, CKMB, CKMBINDEX, TROPONINI in the last 168 hours. BNP (last 3 results) No results for input(s): PROBNP in the last 8760 hours. HbA1C: Recent Labs    04/04/20 0046  HGBA1C 6.6*   CBG: Recent Labs  Lab  04/04/20 0418 04/04/20 0620 04/04/20 0750 04/04/20 1154  GLUCAP 128* 143* 134* 162*   Lipid Profile: Recent Labs    04/03/20 2105  TRIG 216*   Thyroid Function Tests: No results for input(s): TSH, T4TOTAL, FREET4, T3FREE, THYROIDAB in the last 72 hours. Anemia Panel: Recent Labs    04/03/20 2105 04/04/20 0046  FERRITIN 1,088* 974*   Urine analysis:    Component Value Date/Time   COLORURINE AMBER (A) 04/03/2020 1455   APPEARANCEUR CLOUDY (A) 04/03/2020 1455   LABSPEC 1.014 04/03/2020 1455   PHURINE 5.0 04/03/2020 1455   GLUCOSEU NEGATIVE 04/03/2020 1455   HGBUR SMALL (A) 04/03/2020 1455   BILIRUBINUR NEGATIVE 04/03/2020 1455   KETONESUR NEGATIVE 04/03/2020 1455   PROTEINUR >=300 (A) 04/03/2020 1455   UROBILINOGEN 0.2 11/21/2011 1213   NITRITE NEGATIVE 04/03/2020 [place] 04/03/2020 1455  Recent Results (from the past 240 hour(s))  Resp Panel by RT-PCR (Flu A&B, Covid) Nasopharyngeal Swab     Status: Abnormal   Collection Time: 04/03/20  2:56 PM   Specimen: Nasopharyngeal Swab; Nasopharyngeal(NP) swabs in vial transport medium  Result Value Ref Range Status   SARS Coronavirus 2 by RT PCR POSITIVE (A) NEGATIVE Final    Comment: RESULT CALLED TO, READ BACK BY AND VERIFIED WITH: DR RAY @ 2005 04/03/20 [facility] (NOTE) SARS-CoV-2 target nucleic acids are DETECTED.  The SARS-CoV-2 RNA is generally detectable in upper respiratory specimens during the acute phase of infection. Positive results are indicative of the presence of the identified virus, but do not rule out bacterial infection or co-infection with other pathogens not detected by the test. Clinical correlation with patient history and other diagnostic information is necessary to determine patient infection status. The expected result is Negative.  Fact Sheet for Patients: EntrepreneurPulse.com.au  Fact Sheet for Healthcare  Providers: IncredibleEmployment.be  This test is not yet approved or cleared by the [place] FDA and  has been authorized for detection and/or diagnosis of SARS-CoV-2 by FDA under an Emergency Use Authorization (EUA).  This EUA will remain in effect (meaning this test can be used)  for the duration of  the COVID-19 declaration under Section 564(b)(1) of the Act, 21 U.S.C. section 360bbb-3(b)(1), unless the authorization is terminated or revoked sooner.     Influenza A by PCR NEGATIVE NEGATIVE Final   Influenza B by PCR NEGATIVE NEGATIVE Final    Comment: (NOTE) The Xpert Xpress SARS-CoV-2/FLU/RSV plus assay is intended as an aid in the diagnosis of influenza from Nasopharyngeal swab specimens and should not be used as a sole basis for treatment. Nasal washings and aspirates are unacceptable for Xpert Xpress SARS-CoV-2/FLU/RSV testing.  Fact Sheet for Patients: EntrepreneurPulse.com.au  Fact Sheet for Healthcare Providers: IncredibleEmployment.be  This test is not yet approved or cleared by the [place] FDA and has been authorized for detection and/or diagnosis of SARS-CoV-2 by FDA under an Emergency Use Authorization (EUA). This EUA will remain in effect (meaning this test can be used) for the duration of the COVID-19 declaration under Section 564(b)(1) of the Act, 21 U.S.C. section 360bbb-3(b)(1), unless the authorization is terminated or revoked.  Performed at [facility], [place] [address], [place], [place] [address]   Blood culture (routine x 2)     Status: None (Preliminary result)   Collection Time: 04/03/20  3:00 PM   Specimen: BLOOD  Result Value Ref Range Status   Specimen Description   Final    BLOOD RIGHT ANTECUBITAL Performed at [facility] [address] [place], [place] [address]    Special Requests   Final    BOTTLES DRAWN AEROBIC AND ANAEROBIC Blood  Culture adequate volume Performed at [facility] [address] [place], [place] [address]    Culture   Final    NO GROWTH < 12 HOURS Performed at [facility] [address], [place], [place] [address]    Report Status PENDING  Incomplete  Blood culture (routine x 2)     Status: None (Preliminary result)   Collection Time: 04/03/20  3:41 PM   Specimen: BLOOD  Result Value Ref Range Status   Specimen Description   Final    BLOOD BLOOD LEFT HAND Performed at [facility] [address], [place], [place] [address]    Special Requests   Final  BOTTLES DRAWN AEROBIC AND ANAEROBIC Blood Culture adequate volume Performed at [facility] [address], [place], [place] [address]    Culture   Final    NO GROWTH < 12 HOURS Performed at [facility] [address] [place], [place] [address]    Report Status PENDING  Incomplete      Radiology Studies: CT ABDOMEN PELVIS WO CONTRAST  Result Date: 04/03/2020 CLINICAL DATA:  Abdominal pain. EXAM: CT ABDOMEN AND PELVIS WITHOUT CONTRAST TECHNIQUE: Multidetector CT imaging of the abdomen and pelvis was performed following the standard protocol without IV contrast. COMPARISON:  02/20/2019 FINDINGS: Lower chest: Patchy ground-glass infiltrates in the right lower lobe could be due to developing pneumonia, atypical pneumonia or acute aspiration. No pleural effusions. The heart is normal in size. No pericardial effusion. Three-vessel coronary artery calcifications are noted. Small hiatal hernia. Hepatobiliary: No hepatic lesions are identified without contrast. No intrahepatic biliary dilatation. The gallbladder is surgically absent. No common bile duct dilatation. Pancreas: Marked fatty atrophy of the pancreas. No mass, inflammation or ductal dilatation. Spleen: Normal size.  No focal lesions. Adrenals/Urinary Tract: The adrenal glands are unremarkable. Small, chronically  atrophied kidneys consistent with chronic renal failure. No worrisome renal lesions or hydroureteronephrosis. The bladder is unremarkable. Stomach/Bowel: The stomach, duodenum, small bowel and colon are unremarkable. No acute inflammatory changes, mass lesions or obstructive findings. The terminal ileum is normal. Scattered colonic diverticulosis but no findings for acute diverticulitis. Vascular/Lymphatic: Significant diffuse vascular calcifications. No aneurysm. No mesenteric or retroperitoneal mass or adenopathy. No free air or free fluid. No leaking oral contrast. Reproductive: The uterus is surgically absent. Both ovaries are still present and appear normal. Other: Moderate-sized periumbilical abdominal wall hernia containing fat. Musculoskeletal: No significant bony findings. IMPRESSION: 1. No acute abdominal/pelvic findings, mass lesions or adenopathy. 2. Patchy ground-glass infiltrates in the right lower lobe could be due to developing pneumonia, atypical pneumonia or acute aspiration. 3. Status post cholecystectomy. No biliary dilatation. 4. Small, chronically atrophied kidneys consistent with chronic renal failure. 5. Moderate-sized periumbilical abdominal wall hernia containing fat. 6. Aortic atherosclerosis. Aortic Atherosclerosis (ICD10-I70.0). Electronically Signed   By: Marijo Sanes M.D.   On: 04/03/2020 19:19   CT Head Wo Contrast  Result Date: 04/03/2020 CLINICAL DATA:  Sudden onset of leg weakness today. EXAM: CT HEAD WITHOUT CONTRAST TECHNIQUE: Contiguous axial images were obtained from the base of the skull through the vertex without intravenous contrast. COMPARISON:  None. FINDINGS: Brain: Age advanced cerebral atrophy, ventriculomegaly and mild periventricular white matter disease. No extra-axial fluid collections are identified. No CT findings for acute hemispheric infarction or intracranial hemorrhage. No mass lesions. The brainstem and cerebellum are normal. Vascular: Moderate to  advanced vascular calcifications but no aneurysm or hyperdense vessels. Skull: No skull fracture or bone lesions. Sinuses/Orbits: The paranasal sinuses and mastoid air cells are clear except for a 2 cm mucous retention cyst or polyp in the left maxillary sinus. The globes are intact. Other: No scalp lesions or scalp hematoma. IMPRESSION: 1. Age advanced cerebral atrophy, ventriculomegaly and mild periventricular white matter disease. 2. No acute intracranial findings or mass lesions. Electronically Signed   By: Marijo Sanes M.D.   On: 04/03/2020 19:14   US RENAL  Result Date: 04/04/2020 CLINICAL DATA:  Acute kidney injury EXAM: RENAL / URINARY TRACT ULTRASOUND COMPLETE COMPARISON:  CT 04/03/2020 FINDINGS: Technical note: Significantly limited examination secondary to poor penetration related to patient body habitus and shadowing from overlying bowel gas. Right Kidney:  Limited. Echogenic renal parenchyma with cortical thinning compatible with chronic medical renal disease. Right renal length of approximately 10.6 cm. No shadowing stone or hydronephrosis is identified. Left Kidney: Very limited evaluation. Visualized left kidney appears diffusely echogenic with renal cortical thinning compatible with chronic medical renal disease. No obvious hydronephrosis. Bladder: Appears normal for degree of bladder distention. Other: None. IMPRESSION: 1. Limited exam. 2. No sonographic evidence of obstructive uropathy. 3. Atrophic, echogenic kidneys compatible with chronic medical renal disease. Electronically Signed   By: Davina Poke D.O.   On: 04/04/2020 12:18   DG Chest Port 1 View  Result Date: 04/03/2020 CLINICAL DATA:  Right lower lobe infiltrate EXAM: PORTABLE CHEST 1 VIEW COMPARISON:  Same day CT abdomen pelvis, 04/03/2020, chest radiographs, 04/05/2014 FINDINGS: The heart size and mediastinal contours are within normal limits. Subtle heterogeneous airspace opacity of the right lung base. The visualized  skeletal structures are unremarkable. IMPRESSION: Subtle heterogeneous airspace opacity of the right lung base, in keeping with findings of prior CT and consistent with infection or aspiration. Electronically Signed   By: Eddie Candle M.D.   On: 04/03/2020 20:23   ECHOCARDIOGRAM COMPLETE  Result Date: 04/04/2020    ECHOCARDIOGRAM REPORT   Patient Name:   ATHZIRY MILLICAN Stoiber Date of Exam: 04/04/2020 Medical Rec #:  [id number]     Height:       62.0 in Accession #:    [id number]    Weight:       211.2 lb Date of Birth:  06-10-1950     BSA:          1.956 m Patient Age:    66 years      BP:           146/78 mmHg Patient Gender: F             HR:           82 bpm. Exam Location:  Inpatient Procedure: 2D Echo, Cardiac Doppler, Color Doppler and Intracardiac            Opacification Agent Indications:    Abnormal ECG 794.31 / R94.31  History:        Patient has prior history of Echocardiogram examinations, most                 recent 11/11/1996. CAD, Signs/Symptoms:Shortness of Breath and                 Chest Pain; Risk Factors:Hypertension, Diabetes and                 Dyslipidemia.  Sonographer:    Bernadene Person RDCS Referring Phys: [id number] [place]  1. Left ventricular ejection fraction, by estimation, is 60 to 65%. The left ventricle has normal function. The left ventricle has no regional wall motion abnormalities. Left ventricular diastolic parameters are consistent with Grade II diastolic dysfunction (pseudonormalization). Elevated left ventricular end-diastolic pressure.  2. Right ventricular systolic function is normal. The right ventricular size is normal. There is normal pulmonary artery systolic pressure.  3. The mitral valve is degenerative. Trivial mitral valve regurgitation. No evidence of mitral stenosis. Moderate mitral annular calcification.  4. The aortic valve is tricuspid. Aortic valve regurgitation is not visualized. Mild to moderate aortic valve sclerosis/calcification is  present, without any evidence of aortic stenosis.  5. The inferior vena cava is normal in size with greater than 50% respiratory variability, suggesting right atrial pressure of 3 mmHg. FINDINGS  Left Ventricle: Left  ventricular ejection fraction, by estimation, is 60 to 65%. The left ventricle has normal function. The left ventricle has no regional wall motion abnormalities. Definity contrast agent was given IV to delineate the left ventricular  endocardial borders. The left ventricular internal cavity size was normal in size. There is no left ventricular hypertrophy. Left ventricular diastolic parameters are consistent with Grade II diastolic dysfunction (pseudonormalization). Elevated left ventricular end-diastolic pressure. Right Ventricle: The right ventricular size is normal. No increase in right ventricular wall thickness. Right ventricular systolic function is normal. There is normal pulmonary artery systolic pressure. The tricuspid regurgitant velocity is 2.75 m/s, and  with an assumed right atrial pressure of 3 mmHg, the estimated right ventricular systolic pressure is 17.4 mmHg. Left Atrium: Left atrial size was normal in size. Right Atrium: Right atrial size was normal in size. Pericardium: There is no evidence of pericardial effusion. Mitral Valve: The mitral valve is degenerative in appearance. There is moderate thickening of the mitral valve leaflet(s). There is moderate calcification of the mitral valve leaflet(s). Moderate mitral annular calcification. Trivial mitral valve regurgitation. No evidence of mitral valve stenosis. Tricuspid Valve: The tricuspid valve is normal in structure. Tricuspid valve regurgitation is mild . No evidence of tricuspid stenosis. Aortic Valve: The aortic valve is tricuspid. Aortic valve regurgitation is not visualized. Mild to moderate aortic valve sclerosis/calcification is present, without any evidence of aortic stenosis. Pulmonic Valve: The pulmonic valve was normal  in structure. Pulmonic valve regurgitation is not visualized. No evidence of pulmonic stenosis. Aorta: The aortic root is normal in size and structure. Venous: The inferior vena cava is normal in size with greater than 50% respiratory variability, suggesting right atrial pressure of 3 mmHg. IAS/Shunts: No atrial level shunt detected by color flow Doppler.  LEFT VENTRICLE PLAX 2D LVIDd:         5.80 cm  Diastology LVIDs:         4.10 cm  LV e' medial:    3.81 cm/s LV PW:         0.70 cm  LV E/e' medial:  24.3 LV IVS:        1.00 cm  LV e' lateral:   4.57 cm/s LVOT diam:     2.00 cm  LV E/e' lateral: 20.2 LV SV:         78 LV SV Index:   40 LVOT Area:     3.14 cm  RIGHT VENTRICLE RV S prime:     13.10 cm/s TAPSE (M-mode): 2.0 cm LEFT ATRIUM             Index       RIGHT ATRIUM           Index LA diam:        3.40 cm 1.74 cm/m  RA Area:     18.10 cm LA Vol (A2C):   56.0 ml 28.62 ml/m RA Volume:   47.40 ml  24.23 ml/m LA Vol (A4C):   51.1 ml 26.12 ml/m LA Biplane Vol: 53.4 ml 27.29 ml/m  AORTIC VALVE LVOT Vmax:   104.00 cm/s LVOT Vmean:  86.300 cm/s LVOT VTI:    0.249 m  AORTA Ao Root diam: 3.30 cm Ao Asc diam:  3.60 cm MITRAL VALVE                TRICUSPID VALVE MV Area (PHT): 2.48 cm     TR Peak grad:   30.2 mmHg MV Decel Time: 306 msec  TR Vmax:        275.00 cm/s MV E velocity: 92.40 cm/s MV A velocity: 138.00 cm/s  SHUNTS MV E/A ratio:  0.67         Systemic VTI:  0.25 m                             Systemic Diam: 2.00 cm Jenkins Rouge MD Electronically signed by Jenkins Rouge MD Signature Date/Time: 04/04/2020/11:23:04 AM    Final     Scheduled Meds: . amLODipine  10 mg Oral Daily  . vitamin C  500 mg Oral Daily  . aspirin EC  81 mg Oral Daily  . DULoxetine  60 mg Oral Daily  . gabapentin  100 mg Oral QHS  . heparin injection (subcutaneous)  7,500 Units Subcutaneous Q8H  . hydrALAZINE  50 mg Oral TID  . insulin aspart  0-20 Units Subcutaneous Q4H  . insulin detemir  0.1 Units/kg Subcutaneous  BID  . isosorbide mononitrate  30 mg Oral Daily  . linagliptin  5 mg Oral QHS  . pantoprazole  20 mg Oral Daily  . pravastatin  40 mg Oral q1800  . rOPINIRole  0.25 mg Oral QPM  . sodium bicarbonate  650 mg Oral BID  . zinc sulfate  220 mg Oral Daily   Continuous Infusions: . sodium chloride 100 mL/hr at 04/04/20 1334  . remdesivir 100 mg in NS 100 mL 100 mg (04/04/20 1055)     LOS: 0 days   Time spent: 35 minutes.  Patrecia Pour, MD [facility] [email or web address] 04/04/2020, 4:33 PM

## 2020-04-04 NOTE — Progress Notes (Signed)
Cardiology asked to review telemetry over concern for 2:1 AV block.  Tele reviewed and I cannot find any evidence of heart block.  There is significant artifact in the baseline and frequent PVCs but no AV block.  12 lead EKG from 04/03/2020 showed NSR with LBBB and a short run of atrial tachycardia and read out as AV block but no AV block present. Please call if we can be of further assistance.

## 2020-04-04 NOTE — Consult Note (Addendum)
[facility]phrology Consultation Note  Requesting MD: Dr Vance Gather Reason for consult: AKI on CKD  HPI:  Ann [name] is a 69 y.o. female with history of HTN, DM, sleep apnea, anemia, CKD stage IV presented with diarrhea, nausea, fever chills found to have Covid pneumonia, seen as a consultation for the evaluation of AKI on CKD. Patient was exposed to a family member was positive for COVID-19.  She started feeling weak, sleepy, subjective fever, cough associated with worsening nausea vomiting and diarrhea.  On arrival to the ER she was hemodynamically stable, treated with azithromycin and ceftriaxone and NS IV bolus. For CKD she follows with Dr. Hollie Salk at [facility].  She was referred to vascular for the placement of permanent access, she has appointment with vascular on 12/22.  The baseline creatinine level seems to be around 2.7-3.3. During admission, the labs showed elevated creatinine level of 5.38, BUN 61, potassium 3.6.  Increased inflammatory markers including CRP, ferritin. The urinalysis with proteinuria, pyuria and bacteriuria.  The CT scan of abdomen pelvis with chronically atrophied kidneys without any hydronephrosis.  Chest x-ray with airspace opacity of the right lung base. Patient said she is on Lasix, lisinopril-HCTZ at home. She is currently on room air. She is receiving remdesivir, zinc for COVID-19. She reports dry mouth.  Currently denies nausea vomiting, dysgeusia, chest pain or shortness of breath.  No headache or dizziness.  Creatinine, Ser  Date/Time Value Ref Range Status  04/04/2020 12:46 AM 5.38 (H) 0.44 - 1.00 mg/dL Final  04/03/2020 02:55 PM 5.23 (H) 0.44 - 1.00 mg/dL Final  02/27/2019 05:48 AM 2.64 (H) 0.44 - 1.00 mg/dL Final  02/26/2019 04:17 AM 2.82 (H) 0.44 - 1.00 mg/dL Final  02/25/2019 06:38 AM 2.78 (H) 0.44 - 1.00 mg/dL Final  02/23/2019 11:08 AM 2.95 (H) 0.44 - 1.00 mg/dL Final  02/21/2019 04:47 AM 3.37 (H) 0.44 - 1.00  mg/dL Final  02/20/2019 06:00 PM 3.38 (H) 0.44 - 1.00 mg/dL Final    PMHx:   Past Medical History:  Diagnosis Date  . Anemia 09/29/2008   anemia of chronic disease  . Anginal pain ([place])    no recent chest pain  . Arthritis   . Asthma 09/29/2008  . CAD (coronary artery disease) 09/29/2008   hx  PCI - [facility]. Stress test 10/2011 nl with EF 58%. [place] card., last cath 2010  . Chest pain 02/16/2009  . Chronic kidney disease (CKD), stage III (moderate) ([place]) 10/20/2009  . GERD (gastroesophageal reflux disease)   . Hyperlipidemia 10/21/2009  . Hypertension 09/29/2008  . Insulin dependent diabetes mellitus type IA ([place]) 09/29/2008  . Lumbar back pain 02/16/2009   states pain goes down right leg  . Myalgia 02/16/2009  . Shortness of breath    with exerction  . Shoulder pain, left 03/16/2009   occurs at night  . Sleep apnea    CPAP    Past Surgical History:  Procedure Laterality Date  . ABDOMINAL HYSTERECTOMY    . APPENDECTOMY    . CARDIAC CATHETERIZATION  2008   in [place], [place] stable  . CARDIAC CATHETERIZATION  2010   patent stents in mid and distal RCA; high grade disease in prox & mid AV groove LCX& OM; mod disease in prox & mid LAD normal LV FUNCTION--TREATED MEDICALLY    . CATARACT EXTRACTION, BILATERAL    . CHOLECYSTECTOMY    . CORONARY ANGIOPLASTY WITH STENT PLACEMENT  2005   2 vessel PCI at [facility]  hosp.  Marland Kitchen DOPPLER ECHOCARDIOGRAPHY  1998   EF 60%  . TOTAL KNEE ARTHROPLASTY  12/2011   Left  . TOTAL KNEE ARTHROPLASTY  11/28/2011   Procedure: TOTAL KNEE ARTHROPLASTY;  Surgeon: Hessie Dibble, MD;  Location: [facility];  Service: Orthopedics;  Laterality: Right;    Family Hx:  Family History  Problem Relation Age of Onset  . Heart attack Father   . Heart disease Sister   . Heart disease Brother   . Diabetes Mother   . Hypertension Mother   . Heart disease Mother   . Heart attack Mother   . Stroke Mother   . Colon cancer Neg Hx     Social History:   reports that she has never smoked. Her smokeless tobacco use includes snuff. She reports that she does not drink alcohol and does not use drugs.  Allergies:  Allergies  Allergen Reactions  . Codeine Nausea And Vomiting  . Metronidazole Itching    Medications: Prior to Admission medications   Medication Sig Start Date End Date Taking? Authorizing Provider  ACCU-CHEK FASTCLIX LANCETS MISC USE AS DIRECTED TO CHECK BLOOD SUGAR FOUR TIMES A DAY(BEFORE MEALS AND AT BEDTIME). Patient taking differently: 1 each by Other route 4 (four) times daily. 10/14/12  Yes Sid Falcon, MD  ACCU-CHEK SMARTVIEW test strip CHECK BLOOD SUGAR BEFORE MEALS AND AT BEDTIME Patient taking differently: 1 each by Other route 4 (four) times daily -  before meals and at bedtime. 10/14/12  Yes Sid Falcon, MD  amLODipine (NORVASC) 10 MG tablet TAKE 1 TABLET BY MOUTH DAILY Patient taking differently: Take 10 mg by mouth daily. 02/01/12  Yes Neta Ehlers, MD  aspirin EC 81 MG tablet Take 81 mg by mouth daily.   Yes [provider]  DULoxetine (CYMBALTA) 60 MG capsule Take 60 mg by mouth daily. 03/22/20  Yes [provider]  furosemide (LASIX) 40 MG tablet Take 40 mg by mouth daily. 01/14/20  Yes [provider]  gabapentin (NEURONTIN) 100 MG capsule Take 1 capsule (100 mg total) by mouth at bedtime. 02/28/19  Yes Satsangi, Jabier Gauss, MD  HUMALOG KWIKPEN 100 UNIT/ML KwikPen Inject 10 Units into the skin 3 (three) times daily. 02/27/20  Yes [provider]  hydrALAZINE (APRESOLINE) 50 MG tablet Take 50 mg by mouth 3 (three) times daily. 01/14/20  Yes [provider]  Insulin Pen Needle (EASY TOUCH PEN NEEDLES) 31G X 8 MM MISC Use to inject insulin as instructed. 02/02/12  Yes Bartholomew Crews, MD  isosorbide mononitrate (IMDUR) 30 MG 24 hr tablet TAKE 1 TABLET BY MOUTH ONCE DAILY Patient taking differently: Take 30 mg by mouth daily. 11/04/14  Yes Lorretta Harp, MD   linagliptin (TRADJENTA) 5 MG TABS tablet Take 5 mg by mouth daily.   Yes [provider]  lisinopril-hydrochlorothiazide (PRINZIDE,ZESTORETIC) 20-12.5 MG per tablet TAKE 2 [place] Patient taking differently: Take 1 tablet by mouth daily. 02/05/12  Yes Dominic Pea, DO  metoprolol (LOPRESSOR) 50 MG tablet Take 50 mg by mouth 2 (two) times daily.  02/05/12  Yes Paya, Cletus Gash, DO  pantoprazole (PROTONIX) 20 MG tablet TAKE 1 TABLET BY MOUTH DAILY Patient taking differently: Take 20 mg by mouth daily. 02/05/12  Yes Paya, Cletus Gash, DO  pravastatin (PRAVACHOL) 40 MG tablet TAKE ONE TABLET BY MOUTH EVERY EVENING Patient taking differently: Take 40 mg by mouth at bedtime. 06/28/12  Yes Neta Ehlers, MD  rOPINIRole (REQUIP) 0.25 MG  tablet Take 0.25 mg by mouth every evening. 02/27/20  Yes [provider]  sodium bicarbonate 650 MG tablet Take 650 mg by mouth 2 (two) times daily. 03/22/20  Yes [provider]  TOUJEO SOLOSTAR 300 UNIT/ML Solostar Pen Inject 10 Units into the skin at bedtime. 02/27/20  Yes [provider]  insulin aspart (NOVOLOG FLEXPEN) 100 UNIT/ML injection Inject 3 units before each meal Patient not taking: Reported on 04/03/2020 01/17/12   Ansel Bong, MD  insulin detemir (LEVEMIR) 100 UNIT/ML injection Inject 0.16 mLs (16 Units total) into the skin at bedtime. Patient not taking: Reported on 04/03/2020 02/28/19   Charolotte Capuchin, MD    I have reviewed the patient's current medications.  Labs:  Results for orders placed or performed during the hospital encounter of 04/03/20 (from the past 48 hour(s))  CBC     Status: Abnormal   Collection Time: 04/03/20  2:55 PM  Result Value Ref Range   WBC 6.0 4.0 - 10.5 K/uL   RBC 3.92 3.87 - 5.11 MIL/uL   Hemoglobin 11.2 (L) 12.0 - 15.0 g/dL   HCT 34.4 (L) 36.0 - 46.0 %   MCV 87.8 80.0 - 100.0 fL   MCH 28.6 26.0 - 34.0 pg   MCHC 32.6 30.0 - 36.0 g/dL   RDW 14.5 11.5 - 15.5 %    Platelets 166 150 - 400 K/uL   nRBC 0.0 0.0 - 0.2 %    Comment: Performed at [facility], [place] [address], [place], [place] [address]  Comprehensive metabolic panel     Status: Abnormal   Collection Time: 04/03/20  2:55 PM  Result Value Ref Range   Sodium 140 135 - 145 mmol/L   Potassium 4.1 3.5 - 5.1 mmol/L   Chloride 103 98 - 111 mmol/L   CO2 21 (L) 22 - 32 mmol/L   Glucose, Bld 187 (H) 70 - 99 mg/dL    Comment: Glucose reference range applies only to samples taken after fasting for at least 8 hours.   BUN 60 (H) 8 - 23 mg/dL   Creatinine, Ser 5.23 (H) 0.44 - 1.00 mg/dL   Calcium 8.8 (L) 8.9 - 10.3 mg/dL   Total Protein 7.7 6.5 - 8.1 g/dL   Albumin 3.2 (L) 3.5 - 5.0 g/dL   AST 86 (H) 15 - 41 U/L   ALT 42 0 - 44 U/L   Alkaline Phosphatase 61 38 - 126 U/L   Total Bilirubin 0.3 0.3 - 1.2 mg/dL   GFR, Estimated 8 (L) >60 mL/min    Comment: (NOTE) Calculated using the CKD-EPI Creatinine Equation (2021)    Anion gap 16 (H) 5 - 15    Comment: Performed at [facility], [place] [address], [place], [place] [address]  Urinalysis, Routine w reflex microscopic Urine, Catheterized     Status: Abnormal   Collection Time: 04/03/20  2:55 PM  Result Value Ref Range   Color, Urine AMBER (A) YELLOW    Comment: BIOCHEMICALS MAY BE AFFECTED BY COLOR   APPearance CLOUDY (A) CLEAR   Specific Gravity, Urine 1.014 1.005 - 1.030   pH 5.0 5.0 - 8.0   Glucose, UA NEGATIVE NEGATIVE mg/dL   Hgb urine dipstick SMALL (A) NEGATIVE   Bilirubin Urine NEGATIVE NEGATIVE   Ketones, ur NEGATIVE NEGATIVE mg/dL   Protein, ur >=300 (A) NEGATIVE mg/dL   Nitrite NEGATIVE NEGATIVE   Leukocytes,Ua NEGATIVE NEGATIVE   RBC / HPF 6-10 0 - 5 RBC/hpf  WBC, UA 21-50 0 - 5 WBC/hpf   Bacteria, UA RARE (A) NONE SEEN   Squamous Epithelial / LPF 0-5 0 - 5   Mucus PRESENT    Amorphous Crystal PRESENT     Comment: Performed at [facility], [place] [address],  [place], [place] [address]  Resp Panel by RT-PCR (Flu A&B, Covid) Nasopharyngeal Swab     Status: Abnormal   Collection Time: 04/03/20  2:56 PM   Specimen: Nasopharyngeal Swab; Nasopharyngeal(NP) swabs in vial transport medium  Result Value Ref Range   SARS Coronavirus 2 by RT PCR POSITIVE (A) NEGATIVE    Comment: RESULT CALLED TO, READ BACK BY AND VERIFIED WITH: DR RAY @ 2005 04/03/20 [facility] (NOTE) SARS-CoV-2 target nucleic acids are DETECTED.  The SARS-CoV-2 RNA is generally detectable in upper respiratory specimens during the acute phase of infection. Positive results are indicative of the presence of the identified virus, but do not rule out bacterial infection or co-infection with other pathogens not detected by the test. Clinical correlation with patient history and other diagnostic information is necessary to determine patient infection status. The expected result is Negative.  Fact Sheet for Patients: EntrepreneurPulse.com.au  Fact Sheet for Healthcare Providers: IncredibleEmployment.be  This test is not yet approved or cleared by the [place] FDA and  has been authorized for detection and/or diagnosis of SARS-CoV-2 by FDA under an Emergency Use Authorization (EUA).  This EUA will remain in effect (meaning this test can be used)  for the duration of  the COVID-19 declaration under Section 564(b)(1) of the Act, 21 U.S.C. section 360bbb-3(b)(1), unless the authorization is terminated or revoked sooner.     Influenza A by PCR NEGATIVE NEGATIVE   Influenza B by PCR NEGATIVE NEGATIVE    Comment: (NOTE) The Xpert Xpress SARS-CoV-2/FLU/RSV plus assay is intended as an aid in the diagnosis of influenza from Nasopharyngeal swab specimens and should not be used as a sole basis for treatment. Nasal washings and aspirates are unacceptable for Xpert Xpress SARS-CoV-2/FLU/RSV testing.  Fact Sheet for  Patients: EntrepreneurPulse.com.au  Fact Sheet for Healthcare Providers: IncredibleEmployment.be  This test is not yet approved or cleared by the [place] FDA and has been authorized for detection and/or diagnosis of SARS-CoV-2 by FDA under an Emergency Use Authorization (EUA). This EUA will remain in effect (meaning this test can be used) for the duration of the COVID-19 declaration under Section 564(b)(1) of the Act, 21 U.S.C. section 360bbb-3(b)(1), unless the authorization is terminated or revoked.  Performed at [facility], [place] [address], [place], [place] [address]   Blood culture (routine x 2)     Status: None (Preliminary result)   Collection Time: 04/03/20  3:00 PM   Specimen: BLOOD  Result Value Ref Range   Specimen Description      BLOOD RIGHT ANTECUBITAL Performed at [facility], [place] [address], [place], [place] [address]    Special Requests      BOTTLES DRAWN AEROBIC AND ANAEROBIC Blood Culture adequate volume Performed at [place] [address], [place], [place] [address]    Culture      NO GROWTH < 12 HOURS Performed at [place] [address], [place], [place] [address]    Report Status PENDING   Blood culture (routine x 2)     Status: None (Preliminary result)   Collection Time: 04/03/20  3:41 PM   Specimen: BLOOD  Result Value Ref Range  Specimen Description      BLOOD BLOOD LEFT HAND Performed at [place] [address], [place], [place] [address]    Special Requests      BOTTLES DRAWN AEROBIC AND ANAEROBIC Blood Culture adequate volume Performed at [place] [address], [place], [place] [address]    Culture      NO GROWTH < 12 HOURS Performed at [place] [address], [place], [place] [address]    Report Status PENDING   Lactic acid, plasma     Status: None    Collection Time: 04/03/20  9:05 PM  Result Value Ref Range   Lactic Acid, Venous 1.2 0.5 - 1.9 mmol/L    Comment: Performed at [facility], [place] [address] [place], [place] [address]  Ferritin     Status: Abnormal   Collection Time: 04/03/20  9:05 PM  Result Value Ref Range   Ferritin 1,088 (H) 11 - 307 ng/mL    Comment: Performed at [facility], [place] [address] [place], [place] [address]  Triglycerides     Status: Abnormal   Collection Time: 04/03/20  9:05 PM  Result Value Ref Range   Triglycerides 216 (H) <150 mg/dL    Comment: Performed at [facility], [place] [address] [place], [place] [address]  Fibrinogen     Status: Abnormal   Collection Time: 04/03/20  9:05 PM  Result Value Ref Range   Fibrinogen 627 (H) 210 - 475 mg/dL    Comment: Performed at [facility], [place] [address], [place], [place] [address]  C-reactive protein     Status: Abnormal   Collection Time: 04/03/20  9:05 PM  Result Value Ref Range   CRP 16.1 (H) <1.0 mg/dL    Comment: Performed at [facility], [place] [address] [place], [place] [address]  Lactate dehydrogenase     Status: Abnormal   Collection Time: 04/03/20  9:05 PM  Result Value Ref Range   LDH 346 (H) 98 - 192 U/L    Comment: Performed at [facility], [place] [address], [place], [place] [address]  Procalcitonin     Status: None   Collection Time: 04/03/20  9:05 PM  Result Value Ref Range   Procalcitonin <0.10 ng/mL    Comment:        Interpretation: PCT (Procalcitonin) <= 0.5 ng/mL: Systemic infection (sepsis) is not likely. Local bacterial infection is possible. (NOTE)       Sepsis PCT Algorithm           Lower Respiratory Tract                                      Infection PCT Algorithm    ----------------------------     ----------------------------         PCT < 0.25 ng/mL                PCT < 0.10 ng/mL          Strongly  encourage             Strongly discourage   discontinuation of antibiotics    initiation of antibiotics    ----------------------------     -----------------------------       PCT 0.25 - 0.50 ng/mL            PCT  0.10 - 0.25 ng/mL               OR       >80% decrease in PCT            Discourage initiation of                                            antibiotics      Encourage discontinuation           of antibiotics    ----------------------------     -----------------------------         PCT >= 0.50 ng/mL              PCT 0.26 - 0.50 ng/mL               AND        <80% decrease in PCT             Encourage initiation of                                             antibiotics       Encourage continuation           of antibiotics    ----------------------------     -----------------------------        PCT >= 0.50 ng/mL                  PCT > 0.50 ng/mL               AND         increase in PCT                  Strongly encourage                                      initiation of antibiotics    Strongly encourage escalation           of antibiotics                                     -----------------------------                                           PCT <= 0.25 ng/mL                                                 OR                                        > 80% decrease in PCT  Discontinue / Do not initiate                                             antibiotics  Performed at [place] [address], [place], [place] [address]   HIV Antibody (routine testing w rflx)     Status: None   Collection Time: 04/03/20  9:05 PM  Result Value Ref Range   HIV Screen 4th Generation wRfx Non Reactive Non Reactive    Comment: Performed at [facility], [place] [address] [place], [place] [address]  Lactic acid, plasma     Status: None   Collection Time: 04/04/20 12:46 AM  Result Value Ref Range   Lactic Acid, Venous 1.0  0.5 - 1.9 mmol/L    Comment: Performed at [facility], [place] [address], [place], [place] [address]  HIV Antibody (routine testing w rflx)     Status: None   Collection Time: 04/04/20 12:46 AM  Result Value Ref Range   HIV Screen 4th Generation wRfx Non Reactive Non Reactive    Comment: Performed at [facility], [place] [address] [place], [place] [address]  Magnesium     Status: None   Collection Time: 04/04/20 12:46 AM  Result Value Ref Range   Magnesium 2.2 1.7 - 2.4 mg/dL    Comment: Performed at [facility], [place] [address], [place], [place] [address]  Phosphorus     Status: Abnormal   Collection Time: 04/04/20 12:46 AM  Result Value Ref Range   Phosphorus 5.7 (H) 2.5 - 4.6 mg/dL    Comment: Performed at [facility], [place] [address], [place], [place] [address]  Ferritin     Status: Abnormal   Collection Time: 04/04/20 12:46 AM  Result Value Ref Range   Ferritin 974 (H) 11 - 307 ng/mL    Comment: Performed at [facility], [place] [address] [place], [place] [address]  C-reactive protein     Status: Abnormal   Collection Time: 04/04/20 12:46 AM  Result Value Ref Range   CRP 14.5 (H) <1.0 mg/dL    Comment: Performed at [facility], [place] [address] [place],  [address]  Comprehensive metabolic panel     Status: Abnormal   Collection Time: 04/04/20 12:46 AM  Result Value Ref Range   Sodium 141 135 - 145 mmol/L   Potassium 3.6 3.5 - 5.1 mmol/L   Chloride 105 98 - 111 mmol/L   CO2 20 (L) 22 - 32 mmol/L   Glucose, Bld 123 (H) 70 - 99 mg/dL    Comment: Glucose reference range applies only to samples taken after fasting for at least 8 hours.   BUN 61 (H) 8 - 23 mg/dL   Creatinine, Ser 5.38 (H) 0.44 - 1.00 mg/dL   Calcium 8.3 (L) 8.9 - 10.3 mg/dL   Total Protein 6.4 (L) 6.5 - 8.1 g/dL   Albumin 2.7 (L) 3.5 - 5.0 g/dL   AST 79 (H) 15 - 41 U/L   ALT 36 0 - 44 U/L   Alkaline  Phosphatase 49 38 - 126 U/L   Total Bilirubin 0.6 0.3 - 1.2 mg/dL   GFR, Estimated 8 (L) >60 mL/min    Comment: (NOTE) Calculated using the CKD-EPI Creatinine Equation (2021)    Anion gap 16 (H) 5 -  15    Comment: Performed at [facility], [place] [address] [place], [place] [address]  CBC with Differential/Platelet     Status: Abnormal   Collection Time: 04/04/20 12:46 AM  Result Value Ref Range   WBC 4.0 4.0 - 10.5 K/uL   RBC 3.44 (L) 3.87 - 5.11 MIL/uL   Hemoglobin 9.8 (L) 12.0 - 15.0 g/dL   HCT 30.5 (L) 36.0 - 46.0 %   MCV 88.7 80.0 - 100.0 fL   MCH 28.5 26.0 - 34.0 pg   MCHC 32.1 30.0 - 36.0 g/dL   RDW 14.5 11.5 - 15.5 %   Platelets 142 (L) 150 - 400 K/uL    Comment: REPEATED TO VERIFY   nRBC 0.0 0.0 - 0.2 %   Neutrophils Relative % 57 %   Neutro Abs 2.3 1.7 - 7.7 K/uL   Lymphocytes Relative 30 %   Lymphs Abs 1.2 0.7 - 4.0 K/uL   Monocytes Relative 12 %   Monocytes Absolute 0.5 0.1 - 1.0 K/uL   Eosinophils Relative 0 %   Eosinophils Absolute 0.0 0.0 - 0.5 K/uL   Basophils Relative 0 %   Basophils Absolute 0.0 0.0 - 0.1 K/uL   Immature Granulocytes 1 %   Abs Immature Granulocytes 0.02 0.00 - 0.07 K/uL    Comment: Performed at [facility], [place] [address], [place], [place] [address]  D-dimer, quantitative (not at [facility])     Status: Abnormal   Collection Time: 04/04/20 12:46 AM  Result Value Ref Range   D-Dimer, Quant 3.40 (H) 0.00 - 0.50 ug/mL-FEU    Comment: (NOTE) At the manufacturer cut-off value of 0.5 g/mL FEU, this assay has a negative predictive value of 95-100%.This assay is intended for use in conjunction with a clinical pretest probability (PTP) assessment model to exclude pulmonary embolism (PE) and deep venous thrombosis (DVT) in outpatients suspected of PE or DVT. Results should be correlated with clinical presentation. Performed at [facility], [place] [address] [place], [place] [address]    Hemoglobin A1c     Status: Abnormal   Collection Time: 04/04/20 12:46 AM  Result Value Ref Range   Hgb A1c MFr Bld 6.6 (H) 4.8 - 5.6 %    Comment: (NOTE) Pre diabetes:          5.7%-6.4%  Diabetes:              >6.4%  Glycemic control for   <7.0% adults with diabetes    Mean Plasma Glucose 142.72 mg/dL    Comment: Performed at [place] [address] [place], [place] [address]  Glucose, capillary     Status: Abnormal   Collection Time: 04/04/20  4:18 AM  Result Value Ref Range   Glucose-Capillary 128 (H) 70 - 99 mg/dL    Comment: Glucose reference range applies only to samples taken after fasting for at least 8 hours.  Glucose, capillary     Status: Abnormal   Collection Time: 04/04/20  6:20 AM  Result Value Ref Range   Glucose-Capillary 143 (H) 70 - 99 mg/dL    Comment: Glucose reference range applies only to samples taken after fasting for at least 8 hours.  Glucose, capillary     Status: Abnormal   Collection Time: 04/04/20  7:50 AM  Result Value Ref Range   Glucose-Capillary 134 (H) 70 - 99 mg/dL    Comment: Glucose reference range applies only to samples taken after fasting for at least  8 hours.  Glucose, capillary     Status: Abnormal   Collection Time: 04/04/20 11:54 AM  Result Value Ref Range   Glucose-Capillary 162 (H) 70 - 99 mg/dL    Comment: Glucose reference range applies only to samples taken after fasting for at least 8 hours.     ROS:  Pertinent items noted in HPI and remainder of comprehensive ROS otherwise negative.  Physical Exam: Vitals:   04/04/20 0655 04/04/20 1104  BP: (!) 146/78 139/72  Pulse: 80 83  Resp: (!) 23 16  Temp: 98.2 F (36.8 C) (!) 97.5 F (36.4 C)  SpO2: 97% 93%     General exam: Appears calm and comfortable  Respiratory system: Distant breath sound. Respiratory effort normal. No wheezing or crackle Cardiovascular system: S1 & S2 heard, RRR.  No pedal edema. Gastrointestinal system: Abdomen is nondistended, soft  and nontender. Normal bowel sounds heard. Central nervous system: Alert and oriented. No focal neurological deficits. Extremities: Symmetric 5 x 5 power. Skin: No rashes, lesions or ulcers Psychiatry: Judgement and insight appear normal. Mood & affect appropriate.   Assessment/Plan:  #Acute kidney injury on CKD stage IV likely due to Covid related GI  loss concomitant with use of Lasix, lisinopril-HCTZ. Baseline creatinine level around 2.7-3.3.  She was recently referred to vascular for the placement of permanent access. Urinalysis with proteinuria, pyuria and bacteriuria.  The CT scan showed chronically atrophic kidneys without hydronephrosis.  Pending kidney ultrasound. She received total of a liter of IV fluid after admission.  She still looks dry on exam therefore I will start NS 100 cc/hour. Order bladder scan, strict ins and out, daily lab Avoid NSAIDs, IV contrast and hypotensive episode.  Continue to hold diuretics, ACE inhibitor or ARB. No indication for dialysis.  Hopefully she will have some renal recovery and can follow-up outpatient.  #COVID-19 infection: Received remdesivir, azithromycin, ceftriaxone.  Further management per primary team.  #Hypertension: Continue amlodipine, hydralazine and Imdur.  Holding ACE inhibitor and diuretics as discussed above.  Monitor blood pressure.  #Anemia: Hemoglobin 11.2 on admission and slight drop today likely due to IV fluid.  High ferritin level.  Continue to monitor.  Thank you for the consult.  We will follow with you.  Daichi Moris Tanna Furry 04/04/2020, 12:08 PM  [facility].

## 2020-04-04 NOTE — Plan of Care (Signed)
POC discussed with pt, pt alert and oriented x4, shows no signs of distress. No complaints of pain. Currently on room air. Bed wheels locked, nonskid footwear applied OOB, call bell within reach, encouraged to use call bell for assistance, no falls during shift  Problem: Education: Goal: Knowledge of General Education information will improve Description: Including pain rating scale, medication(s)/side effects and non-pharmacologic comfort measures Outcome: Progressing   Problem: Health Behavior/Discharge Planning: Goal: Ability to manage health-related needs will improve Outcome: Progressing   Problem: Clinical Measurements: Goal: Ability to maintain clinical measurements within normal limits will improve Outcome: Progressing Goal: Will remain free from infection Outcome: Progressing Goal: Diagnostic test results will improve Outcome: Progressing Goal: Respiratory complications will improve Outcome: Progressing Goal: Cardiovascular complication will be avoided Outcome: Progressing   Problem: Nutrition: Goal: Adequate nutrition will be maintained Outcome: Progressing   Problem: Coping: Goal: Level of anxiety will decrease Outcome: Progressing   Problem: Elimination: Goal: Will not experience complications related to bowel motility Outcome: Progressing Goal: Will not experience complications related to urinary retention Outcome: Progressing   Problem: Pain Managment: Goal: General experience of comfort will improve Outcome: Progressing   Problem: Safety: Goal: Ability to remain free from injury will improve Outcome: Progressing   Problem: Skin Integrity: Goal: Risk for impaired skin integrity will decrease Outcome: Progressing   Problem: Education: Goal: Knowledge of risk factors and measures for prevention of condition will improve Outcome: Progressing   Problem: Coping: Goal: Psychosocial and spiritual needs will be supported Outcome: Progressing   Problem:  Respiratory: Goal: Will maintain a patent airway Outcome: Progressing Goal: Complications related to the disease process, condition or treatment will be avoided or minimized Outcome: Progressing

## 2020-04-05 LAB — CBC WITH DIFFERENTIAL/PLATELET
Abs Immature Granulocytes: 0.03 10*3/uL (ref 0.00–0.07)
Basophils Absolute: 0 10*3/uL (ref 0.0–0.1)
Basophils Relative: 0 %
Eosinophils Absolute: 0 10*3/uL (ref 0.0–0.5)
Eosinophils Relative: 0 %
HCT: 28 % — ABNORMAL LOW (ref 36.0–46.0)
Hemoglobin: 9 g/dL — ABNORMAL LOW (ref 12.0–15.0)
Immature Granulocytes: 1 %
Lymphocytes Relative: 14 %
Lymphs Abs: 0.7 10*3/uL (ref 0.7–4.0)
MCH: 28.1 pg (ref 26.0–34.0)
MCHC: 32.1 g/dL (ref 30.0–36.0)
MCV: 87.5 fL (ref 80.0–100.0)
Monocytes Absolute: 0.5 10*3/uL (ref 0.1–1.0)
Monocytes Relative: 10 %
Neutro Abs: 3.5 10*3/uL (ref 1.7–7.7)
Neutrophils Relative %: 75 %
Platelets: 166 10*3/uL (ref 150–400)
RBC: 3.2 MIL/uL — ABNORMAL LOW (ref 3.87–5.11)
RDW: 14.5 % (ref 11.5–15.5)
WBC: 4.7 10*3/uL (ref 4.0–10.5)
nRBC: 0 % (ref 0.0–0.2)

## 2020-04-05 LAB — COMPREHENSIVE METABOLIC PANEL
ALT: 30 U/L (ref 0–44)
AST: 57 U/L — ABNORMAL HIGH (ref 15–41)
Albumin: 2.7 g/dL — ABNORMAL LOW (ref 3.5–5.0)
Alkaline Phosphatase: 45 U/L (ref 38–126)
Anion gap: 17 — ABNORMAL HIGH (ref 5–15)
BUN: 82 mg/dL — ABNORMAL HIGH (ref 8–23)
CO2: 18 mmol/L — ABNORMAL LOW (ref 22–32)
Calcium: 8.6 mg/dL — ABNORMAL LOW (ref 8.9–10.3)
Chloride: 106 mmol/L (ref 98–111)
Creatinine, Ser: 6.03 mg/dL — ABNORMAL HIGH (ref 0.44–1.00)
GFR, Estimated: 7 mL/min — ABNORMAL LOW (ref >60)
Glucose, Bld: 109 mg/dL — ABNORMAL HIGH (ref 70–99)
Potassium: 3.9 mmol/L (ref 3.5–5.1)
Sodium: 141 mmol/L (ref 135–145)
Total Bilirubin: 0.3 mg/dL (ref 0.3–1.2)
Total Protein: 6.1 g/dL — ABNORMAL LOW (ref 6.5–8.1)

## 2020-04-05 LAB — GLUCOSE, CAPILLARY
Glucose-Capillary: 103 mg/dL — ABNORMAL HIGH (ref 70–99)
Glucose-Capillary: 104 mg/dL — ABNORMAL HIGH (ref 70–99)
Glucose-Capillary: 116 mg/dL — ABNORMAL HIGH (ref 70–99)
Glucose-Capillary: 120 mg/dL — ABNORMAL HIGH (ref 70–99)
Glucose-Capillary: 147 mg/dL — ABNORMAL HIGH (ref 70–99)
Glucose-Capillary: 155 mg/dL — ABNORMAL HIGH (ref 70–99)
Glucose-Capillary: 183 mg/dL — ABNORMAL HIGH (ref 70–99)
Glucose-Capillary: 93 mg/dL (ref 70–99)

## 2020-04-05 LAB — FERRITIN: Ferritin: 1323 ng/mL — ABNORMAL HIGH (ref 11–307)

## 2020-04-05 LAB — MAGNESIUM: Magnesium: 2.2 mg/dL (ref 1.7–2.4)

## 2020-04-05 LAB — PHOSPHORUS: Phosphorus: 6.1 mg/dL — ABNORMAL HIGH (ref 2.5–4.6)

## 2020-04-05 LAB — C-REACTIVE PROTEIN: CRP: 10.7 mg/dL — ABNORMAL HIGH (ref <1.0)

## 2020-04-05 LAB — PROTEIN / CREATININE RATIO, URINE
Creatinine, Urine: 145.32 mg/dL
Protein Creatinine Ratio: 143.89 mg/mg{Cre} — ABNORMAL HIGH (ref 0.00–0.15)
Total Protein, Urine: 137.86 mg/dL

## 2020-04-05 LAB — D-DIMER, QUANTITATIVE: D-Dimer, Quant: 2.65 ug/mL-FEU — ABNORMAL HIGH (ref 0.00–0.50)

## 2020-04-05 MED ORDER — SODIUM CHLORIDE 0.9 % IV SOLN: INTRAVENOUS | Status: DC

## 2020-04-05 NOTE — Plan of Care (Signed)
POC discussed with pt, pt alert and oriented x4, shows no signs of distress, VSS. No complaints of pain. Pt currently on RA. Pt up in chair with PT. Bed wheels locked, nonskid footwear applied OOB, call bell within reach, encouraged to use call bell for assistance, no falls during shift  Problem: Education: Goal: Knowledge of General Education information will improve Description: Including pain rating scale, medication(s)/side effects and non-pharmacologic comfort measures Outcome: Progressing   Problem: Health Behavior/Discharge Planning: Goal: Ability to manage health-related needs will improve Outcome: Progressing   Problem: Clinical Measurements: Goal: Ability to maintain clinical measurements within normal limits will improve Outcome: Progressing Goal: Will remain free from infection Outcome: Progressing Goal: Diagnostic test results will improve Outcome: Progressing Goal: Respiratory complications will improve Outcome: Progressing Goal: Cardiovascular complication will be avoided Outcome: Progressing   Problem: Activity: Goal: Risk for activity intolerance will decrease Outcome: Progressing   Problem: Nutrition: Goal: Adequate nutrition will be maintained Outcome: Progressing   Problem: Coping: Goal: Level of anxiety will decrease Outcome: Progressing   Problem: Elimination: Goal: Will not experience complications related to bowel motility Outcome: Progressing Goal: Will not experience complications related to urinary retention Outcome: Progressing   Problem: Pain Managment: Goal: General experience of comfort will improve Outcome: Progressing   Problem: Safety: Goal: Ability to remain free from injury will improve Outcome: Progressing   Problem: Skin Integrity: Goal: Risk for impaired skin integrity will decrease Outcome: Progressing   Problem: Education: Goal: Knowledge of risk factors and measures for prevention of condition will improve Outcome:  Progressing   Problem: Coping: Goal: Psychosocial and spiritual needs will be supported Outcome: Progressing   Problem: Respiratory: Goal: Will maintain a patent airway Outcome: Progressing Goal: Complications related to the disease process, condition or treatment will be avoided or minimized Outcome: Progressing

## 2020-04-05 NOTE — Progress Notes (Signed)
PROGRESS NOTE  Ann [name]  ZDG:[id number] DOB: 03-Aug-1950 DOA: 04/03/2020 PCP: [name], MD  Outpatient Specialists: Dr. Hollie Salk, nephrology Brief Narrative: Ann [name] is a 69 y.o. female with with a history of stage IV CKD with associated anemia, CAD, T2DM, HTN, HLD, GERD, asthma, OSA on CPAP, chronic lumbar back pain who presented with nausea, diarrhea, fever, chills, cough, and severe fatigue worsening since exposure to family member recently discharged for covid-19 pneumonia. In the ED 12/11, she was felt to be dehydrated with Cr up to 5.23 from baseline in 2's, bicarbonate 21, BUN 60. AST 86. CRP 16.1, PCT negative. SARS-CoV-2 PCR confirmed positivity and CXR demonstrated mild airspace opacity at the right base. She was given IV fluids, antibiotics which were later stopped, and remdesivir for covid-19 pneumonia and admitted for AKI with nephrology consulting.  Assessment & Plan: Principal Problem:   2019 novel coronavirus disease (COVID-19) Active Problems:   Hyperlipidemia   HYPERTENSION   CAD (coronary artery disease)   CKD (chronic kidney disease), stage IV (HCC)   Uncontrolled type II diabetes mellitus with hyperglycemia (HCC)   Obesity, Class III, BMI 40-49.9 (morbid obesity) (HCC)   GERD (gastroesophageal reflux disease)   AV node arrhythmia   AKI (acute kidney injury) ([place])   Acute renal failure (HCC)  AKI on stage IV CKD: Follows with Dr. Hollie Salk, planning vein mapping and access placement 04/14/2020. - Nephrology consulted, decrease rate, but need to continue IVF with rising creatinine. No indications for HD at this time. No uremic symptoms/signs. Order again placed for UOP monitoring. Will respiratory status very closely with crackles.  Covid-19 pneumonia: SARS-CoV-2 PCR positive on 12/11. Initially put on abx, though discontinued with positive covid swab and negative PCT. - Will continue remdesivir  - DC steroids since not hypoxic. Inflammatory marker grossly  elevated, so would have low threshold to start steroids if hypoxia developed.  - IS - Continue airborne, contact precautions for 21 days from positive testing. - Intermediate dose heparin ordered given renal failure and severity of inflammation/suspected hypercoagulability   T2DM: HbA1c 6.6% - levemir + SSI improving control.   CAD: No chest pain. Cardiology consulted for telemetry abnormalities felt to be insignificant.  - Continue ASA, statin, imdur.  HTN:  - Holding RAS agent and diuretics - Continue hydralazine, imdur  HLD:  - Continue statin  GERD:  - Continue PPI  Morbid obesity: Estimated body mass index is 38.59 kg/m as calculated from the following:   Height as of this encounter: 5\' 2"  (1.575 m).   Weight as of this encounter: 95.7 kg.  DVT prophylaxis: Heparin 7,500 units q8h Code Status: Full Family Communication: Declined offer to call again today Disposition Plan:  Status is: Inpatient  Remains inpatient appropriate because:Persistent severe electrolyte disturbances, Ongoing diagnostic testing needed not appropriate for outpatient work up and Inpatient level of care appropriate due to severity of illness  Dispo: The patient is from: Home              Anticipated d/c is to: SNF              Anticipated d/c date is: 3 days, though depends on renal recovery              Patient currently is not medically stable to d/c.  Consultants:   Nephrology  Cardiology, Dr. Radford Pax  Procedures:   None  Antimicrobials:  Ceftriaxone, azithromycin 12/11  Remdesivir 12/11 >>    Subjective: Feels better today, no dyspnea, chest  pain or other pain. Denies nausea. No UOP charted.   Objective: Vitals:   04/04/20 1950 04/05/20 0344 04/05/20 0931 04/05/20 1347  BP: 136/67 128/69  137/78  Pulse: 78 80  81  Resp: 18 16  17   Temp: 98.1 F (36.7 C) 98.2 F (36.8 C)  97.7 F (36.5 C)  TempSrc: Oral Oral    SpO2: 99% 97%  97%  Weight:   95.7 kg   Height:         Intake/Output Summary (Last 24 hours) at 04/05/2020 1450 Last data filed at 04/05/2020 1151 Gross per 24 hour  Intake 1270.68 ml  Output --  Net 1270.68 ml   Filed Weights   04/03/20 2257 04/05/20 0931  Weight: 95.8 kg 95.7 kg   Gen: 69 y.o. female in no distress Pulm: Nonlabored breathing room air. Crackles developing. CV: Regular rate and rhythm. No murmur, rub, or gallop. No JVD, some mild dependent edema. GI: Abdomen soft, non-tender, non-distended, with normoactive bowel sounds.  Ext: Warm, no deformities Skin: No rashes, lesions or ulcers on visualized skin. Neuro: Alert and oriented. No asterixis or focal neurological deficits. Psych: Judgement and insight appear fair. Mood euthymic & affect congruent. Behavior is appropriate.    Data Reviewed: I have personally reviewed following labs and imaging studies  CBC: Recent Labs  Lab 04/03/20 1455 04/04/20 0046 04/05/20 0352  WBC 6.0 4.0 4.7  NEUTROABS  --  2.3 3.5  HGB 11.2* 9.8* 9.0*  HCT 34.4* 30.5* 28.0*  MCV 87.8 88.7 87.5  PLT 166 142* 932   Basic Metabolic Panel: Recent Labs  Lab 04/03/20 1455 04/04/20 0046 04/05/20 0352  NA 140 141 141  K 4.1 3.6 3.9  CL 103 105 106  CO2 21* 20* 18*  GLUCOSE 187* 123* 109*  BUN 60* 61* 82*  CREATININE 5.23* 5.38* 6.03*  CALCIUM 8.8* 8.3* 8.6*  MG  --  2.2 2.2  PHOS  --  5.7* 6.1*   GFR: Estimated Creatinine Clearance: 9.4 mL/min (A) (by C-G formula based on SCr of 6.03 mg/dL (H)). Liver Function Tests: Recent Labs  Lab 04/03/20 1455 04/04/20 0046 04/05/20 0352  AST 86* 79* 57*  ALT 42 36 30  ALKPHOS 61 49 45  BILITOT 0.3 0.6 0.3  PROT 7.7 6.4* 6.1*  ALBUMIN 3.2* 2.7* 2.7*   HbA1C: Recent Labs    04/04/20 0046  HGBA1C 6.6*   CBG: Recent Labs  Lab 04/04/20 2309 04/05/20 0007 04/05/20 0344 04/05/20 0720 04/05/20 1116  GLUCAP 166* 155* 104* 103* 147*   Lipid Profile: Recent Labs    04/03/20 2105  TRIG 216*   Thyroid Function  Tests: No results for input(s): TSH, T4TOTAL, FREET4, T3FREE, THYROIDAB in the last 72 hours. Anemia Panel: Recent Labs    04/04/20 0046 04/05/20 0352  FERRITIN 974* 1,323*   Urine analysis:    Component Value Date/Time   COLORURINE AMBER (A) 04/03/2020 1455   APPEARANCEUR CLOUDY (A) 04/03/2020 1455   LABSPEC 1.014 04/03/2020 1455   PHURINE 5.0 04/03/2020 1455   GLUCOSEU NEGATIVE 04/03/2020 1455   HGBUR SMALL (A) 04/03/2020 1455   BILIRUBINUR NEGATIVE 04/03/2020 1455   KETONESUR NEGATIVE 04/03/2020 1455   PROTEINUR >=300 (A) 04/03/2020 1455   UROBILINOGEN 0.2 11/21/2011 1213   NITRITE NEGATIVE 04/03/2020 1455   LEUKOCYTESUR NEGATIVE 04/03/2020 1455   Recent Results (from the past 240 hour(s))  Resp Panel by RT-PCR (Flu A&B, Covid) Nasopharyngeal Swab     Status: Abnormal   Collection Time: 04/03/20  2:56 PM   Specimen: Nasopharyngeal Swab; Nasopharyngeal(NP) swabs in vial transport medium  Result Value Ref Range Status   SARS Coronavirus 2 by RT PCR POSITIVE (A) NEGATIVE Final    Comment: RESULT CALLED TO, READ BACK BY AND VERIFIED WITH: DR RAY @ 2005 04/03/20 [facility] (NOTE) SARS-CoV-2 target nucleic acids are DETECTED.  The SARS-CoV-2 RNA is generally detectable in upper respiratory specimens during the acute phase of infection. Positive results are indicative of the presence of the identified virus, but do not rule out bacterial infection or co-infection with other pathogens not detected by the test. Clinical correlation with patient history and other diagnostic information is necessary to determine patient infection status. The expected result is Negative.  Fact Sheet for Patients: EntrepreneurPulse.com.au  Fact Sheet for Healthcare Providers: IncredibleEmployment.be  This test is not yet approved or cleared by the [place] FDA and  has been authorized for detection and/or diagnosis of SARS-CoV-2 by FDA under an Emergency Use  Authorization (EUA).  This EUA will remain in effect (meaning this test can be used)  for the duration of  the COVID-19 declaration under Section 564(b)(1) of the Act, 21 U.S.C. section 360bbb-3(b)(1), unless the authorization is terminated or revoked sooner.     Influenza A by PCR NEGATIVE NEGATIVE Final   Influenza B by PCR NEGATIVE NEGATIVE Final    Comment: (NOTE) The Xpert Xpress SARS-CoV-2/FLU/RSV plus assay is intended as an aid in the diagnosis of influenza from Nasopharyngeal swab specimens and should not be used as a sole basis for treatment. Nasal washings and aspirates are unacceptable for Xpert Xpress SARS-CoV-2/FLU/RSV testing.  Fact Sheet for Patients: EntrepreneurPulse.com.au  Fact Sheet for Healthcare Providers: IncredibleEmployment.be  This test is not yet approved or cleared by the [place] FDA and has been authorized for detection and/or diagnosis of SARS-CoV-2 by FDA under an Emergency Use Authorization (EUA). This EUA will remain in effect (meaning this test can be used) for the duration of the COVID-19 declaration under Section 564(b)(1) of the Act, 21 U.S.C. section 360bbb-3(b)(1), unless the authorization is terminated or revoked.  Performed at [facility] [place] [address], [place], [place] [address]   Blood culture (routine x 2)     Status: None (Preliminary result)   Collection Time: 04/03/20  3:00 PM   Specimen: BLOOD  Result Value Ref Range Status   Specimen Description   Final    BLOOD RIGHT ANTECUBITAL Performed at [facility] [address] [place], [place] [address]    Special Requests   Final    BOTTLES DRAWN AEROBIC AND ANAEROBIC Blood Culture adequate volume Performed at [facility] [address] [place], [place] [address]    Culture   Final    NO GROWTH 2 DAYS Performed at [facility] [address], [place], [place]  [address]    Report Status PENDING  Incomplete  Blood culture (routine x 2)     Status: None (Preliminary result)   Collection Time: 04/03/20  3:41 PM   Specimen: BLOOD  Result Value Ref Range Status   Specimen Description   Final    BLOOD BLOOD LEFT HAND Performed at [facility] [address], [place], [place] [address]    Special Requests   Final    BOTTLES DRAWN AEROBIC AND ANAEROBIC Blood Culture adequate volume Performed at [facility] [address], [place], [place] [address]    Culture   Final  NO GROWTH 2 DAYS Performed at [facility], [place] [address], [place], [place] [address]    Report Status PENDING  Incomplete      Radiology Studies: CT ABDOMEN PELVIS WO CONTRAST  Result Date: 04/03/2020 CLINICAL DATA:  Abdominal pain. EXAM: CT ABDOMEN AND PELVIS WITHOUT CONTRAST TECHNIQUE: Multidetector CT imaging of the abdomen and pelvis was performed following the standard protocol without IV contrast. COMPARISON:  02/20/2019 FINDINGS: Lower chest: Patchy ground-glass infiltrates in the right lower lobe could be due to developing pneumonia, atypical pneumonia or acute aspiration. No pleural effusions. The heart is normal in size. No pericardial effusion. Three-vessel coronary artery calcifications are noted. Small hiatal hernia. Hepatobiliary: No hepatic lesions are identified without contrast. No intrahepatic biliary dilatation. The gallbladder is surgically absent. No common bile duct dilatation. Pancreas: Marked fatty atrophy of the pancreas. No mass, inflammation or ductal dilatation. Spleen: Normal size.  No focal lesions. Adrenals/Urinary Tract: The adrenal glands are unremarkable. Small, chronically atrophied kidneys consistent with chronic renal failure. No worrisome renal lesions or hydroureteronephrosis. The bladder is unremarkable. Stomach/Bowel: The stomach, duodenum, small bowel and colon are unremarkable. No acute inflammatory  changes, mass lesions or obstructive findings. The terminal ileum is normal. Scattered colonic diverticulosis but no findings for acute diverticulitis. Vascular/Lymphatic: Significant diffuse vascular calcifications. No aneurysm. No mesenteric or retroperitoneal mass or adenopathy. No free air or free fluid. No leaking oral contrast. Reproductive: The uterus is surgically absent. Both ovaries are still present and appear normal. Other: Moderate-sized periumbilical abdominal wall hernia containing fat. Musculoskeletal: No significant bony findings. IMPRESSION: 1. No acute abdominal/pelvic findings, mass lesions or adenopathy. 2. Patchy ground-glass infiltrates in the right lower lobe could be due to developing pneumonia, atypical pneumonia or acute aspiration. 3. Status post cholecystectomy. No biliary dilatation. 4. Small, chronically atrophied kidneys consistent with chronic renal failure. 5. Moderate-sized periumbilical abdominal wall hernia containing fat. 6. Aortic atherosclerosis. Aortic Atherosclerosis (ICD10-I70.0). Electronically Signed   By: Marijo Sanes M.D.   On: 04/03/2020 19:19   CT Head Wo Contrast  Result Date: 04/03/2020 CLINICAL DATA:  Sudden onset of leg weakness today. EXAM: CT HEAD WITHOUT CONTRAST TECHNIQUE: Contiguous axial images were obtained from the base of the skull through the vertex without intravenous contrast. COMPARISON:  None. FINDINGS: Brain: Age advanced cerebral atrophy, ventriculomegaly and mild periventricular white matter disease. No extra-axial fluid collections are identified. No CT findings for acute hemispheric infarction or intracranial hemorrhage. No mass lesions. The brainstem and cerebellum are normal. Vascular: Moderate to advanced vascular calcifications but no aneurysm or hyperdense vessels. Skull: No skull fracture or bone lesions. Sinuses/Orbits: The paranasal sinuses and mastoid air cells are clear except for a 2 cm mucous retention cyst or polyp in the left  maxillary sinus. The globes are intact. Other: No scalp lesions or scalp hematoma. IMPRESSION: 1. Age advanced cerebral atrophy, ventriculomegaly and mild periventricular white matter disease. 2. No acute intracranial findings or mass lesions. Electronically Signed   By: Marijo Sanes M.D.   On: 04/03/2020 19:14   US RENAL  Result Date: 04/04/2020 CLINICAL DATA:  Acute kidney injury EXAM: RENAL / URINARY TRACT ULTRASOUND COMPLETE COMPARISON:  CT 04/03/2020 FINDINGS: Technical note: Significantly limited examination secondary to poor penetration related to patient body habitus and shadowing from overlying bowel gas. Right Kidney: Limited. Echogenic renal parenchyma with cortical thinning compatible with chronic medical renal disease. Right renal length of approximately 10.6 cm. No shadowing stone or hydronephrosis is identified. Left Kidney: Very limited evaluation. Visualized  left kidney appears diffusely echogenic with renal cortical thinning compatible with chronic medical renal disease. No obvious hydronephrosis. Bladder: Appears normal for degree of bladder distention. Other: None. IMPRESSION: 1. Limited exam. 2. No sonographic evidence of obstructive uropathy. 3. Atrophic, echogenic kidneys compatible with chronic medical renal disease. Electronically Signed   By: Davina Poke D.O.   On: 04/04/2020 12:18   DG Chest Port 1 View  Result Date: 04/03/2020 CLINICAL DATA:  Right lower lobe infiltrate EXAM: PORTABLE CHEST 1 VIEW COMPARISON:  Same day CT abdomen pelvis, 04/03/2020, chest radiographs, 04/05/2014 FINDINGS: The heart size and mediastinal contours are within normal limits. Subtle heterogeneous airspace opacity of the right lung base. The visualized skeletal structures are unremarkable. IMPRESSION: Subtle heterogeneous airspace opacity of the right lung base, in keeping with findings of prior CT and consistent with infection or aspiration. Electronically Signed   By: Eddie Candle M.D.   On:  04/03/2020 20:23   ECHOCARDIOGRAM COMPLETE  Result Date: 04/04/2020    ECHOCARDIOGRAM REPORT   Patient Name:   AKIKO SCHEXNIDER Krikorian Date of Exam: 04/04/2020 Medical Rec #:  [id number]     Height:       62.0 in Accession #:    [id number]    Weight:       211.2 lb Date of Birth:  01-18-1951     BSA:          1.956 m Patient Age:    85 years      BP:           146/78 mmHg Patient Gender: F             HR:           82 bpm. Exam Location:  Inpatient Procedure: 2D Echo, Cardiac Doppler, Color Doppler and Intracardiac            Opacification Agent Indications:    Abnormal ECG 794.31 / R94.31  History:        Patient has prior history of Echocardiogram examinations, most                 recent 11/11/1996. CAD, Signs/Symptoms:Shortness of Breath and                 Chest Pain; Risk Factors:Hypertension, Diabetes and                 Dyslipidemia.  Sonographer:    Bernadene Person RDCS Referring Phys: [id number] [place]  1. Left ventricular ejection fraction, by estimation, is 60 to 65%. The left ventricle has normal function. The left ventricle has no regional wall motion abnormalities. Left ventricular diastolic parameters are consistent with Grade II diastolic dysfunction (pseudonormalization). Elevated left ventricular end-diastolic pressure.  2. Right ventricular systolic function is normal. The right ventricular size is normal. There is normal pulmonary artery systolic pressure.  3. The mitral valve is degenerative. Trivial mitral valve regurgitation. No evidence of mitral stenosis. Moderate mitral annular calcification.  4. The aortic valve is tricuspid. Aortic valve regurgitation is not visualized. Mild to moderate aortic valve sclerosis/calcification is present, without any evidence of aortic stenosis.  5. The inferior vena cava is normal in size with greater than 50% respiratory variability, suggesting right atrial pressure of 3 mmHg. FINDINGS  Left Ventricle: Left ventricular ejection fraction, by  estimation, is 60 to 65%. The left ventricle has normal function. The left ventricle has no regional wall motion abnormalities. Definity contrast agent was given IV to delineate the  left ventricular  endocardial borders. The left ventricular internal cavity size was normal in size. There is no left ventricular hypertrophy. Left ventricular diastolic parameters are consistent with Grade II diastolic dysfunction (pseudonormalization). Elevated left ventricular end-diastolic pressure. Right Ventricle: The right ventricular size is normal. No increase in right ventricular wall thickness. Right ventricular systolic function is normal. There is normal pulmonary artery systolic pressure. The tricuspid regurgitant velocity is 2.75 m/s, and  with an assumed right atrial pressure of 3 mmHg, the estimated right ventricular systolic pressure is 97.6 mmHg. Left Atrium: Left atrial size was normal in size. Right Atrium: Right atrial size was normal in size. Pericardium: There is no evidence of pericardial effusion. Mitral Valve: The mitral valve is degenerative in appearance. There is moderate thickening of the mitral valve leaflet(s). There is moderate calcification of the mitral valve leaflet(s). Moderate mitral annular calcification. Trivial mitral valve regurgitation. No evidence of mitral valve stenosis. Tricuspid Valve: The tricuspid valve is normal in structure. Tricuspid valve regurgitation is mild . No evidence of tricuspid stenosis. Aortic Valve: The aortic valve is tricuspid. Aortic valve regurgitation is not visualized. Mild to moderate aortic valve sclerosis/calcification is present, without any evidence of aortic stenosis. Pulmonic Valve: The pulmonic valve was normal in structure. Pulmonic valve regurgitation is not visualized. No evidence of pulmonic stenosis. Aorta: The aortic root is normal in size and structure. Venous: The inferior vena cava is normal in size with greater than 50% respiratory variability,  suggesting right atrial pressure of 3 mmHg. IAS/Shunts: No atrial level shunt detected by color flow Doppler.  LEFT VENTRICLE PLAX 2D LVIDd:         5.80 cm  Diastology LVIDs:         4.10 cm  LV e' medial:    3.81 cm/s LV PW:         0.70 cm  LV E/e' medial:  24.3 LV IVS:        1.00 cm  LV e' lateral:   4.57 cm/s LVOT diam:     2.00 cm  LV E/e' lateral: 20.2 LV SV:         78 LV SV Index:   40 LVOT Area:     3.14 cm  RIGHT VENTRICLE RV S prime:     13.10 cm/s TAPSE (M-mode): 2.0 cm LEFT ATRIUM             Index       RIGHT ATRIUM           Index LA diam:        3.40 cm 1.74 cm/m  RA Area:     18.10 cm LA Vol (A2C):   56.0 ml 28.62 ml/m RA Volume:   47.40 ml  24.23 ml/m LA Vol (A4C):   51.1 ml 26.12 ml/m LA Biplane Vol: 53.4 ml 27.29 ml/m  AORTIC VALVE LVOT Vmax:   104.00 cm/s LVOT Vmean:  86.300 cm/s LVOT VTI:    0.249 m  AORTA Ao Root diam: 3.30 cm Ao Asc diam:  3.60 cm MITRAL VALVE                TRICUSPID VALVE MV Area (PHT): 2.48 cm     TR Peak grad:   30.2 mmHg MV Decel Time: 306 msec     TR Vmax:        275.00 cm/s MV E velocity: 92.40 cm/s MV A velocity: 138.00 cm/s  SHUNTS MV E/A ratio:  0.67  Systemic VTI:  0.25 m                             Systemic Diam: 2.00 cm Jenkins Rouge MD Electronically signed by Jenkins Rouge MD Signature Date/Time: 04/04/2020/11:23:04 AM    Final     Scheduled Meds: . amLODipine  10 mg Oral Daily  . vitamin C  500 mg Oral Daily  . aspirin EC  81 mg Oral Daily  . DULoxetine  60 mg Oral Daily  . gabapentin  100 mg Oral QHS  . heparin injection (subcutaneous)  7,500 Units Subcutaneous Q8H  . hydrALAZINE  50 mg Oral TID  . insulin aspart  0-20 Units Subcutaneous Q4H  . insulin detemir  0.1 Units/kg Subcutaneous BID  . isosorbide mononitrate  30 mg Oral Daily  . linagliptin  5 mg Oral QHS  . pantoprazole  20 mg Oral Daily  . pravastatin  40 mg Oral q1800  . rOPINIRole  0.25 mg Oral QPM  . sodium bicarbonate  650 mg Oral BID  . zinc sulfate  220 mg  Oral Daily   Continuous Infusions: . sodium chloride    . remdesivir 100 mg in NS 100 mL 100 mg (04/05/20 1041)     LOS: 1 day   Time spent: 35 minutes.  Patrecia Pour, MD [facility] [email or web address] 04/05/2020, 2:50 PM

## 2020-04-05 NOTE — Evaluation (Signed)
Occupational Therapy Evaluation Patient Details Name: Ann [name] MRN: [id number] DOB: 08-26-1950 Today's Date: 04/05/2020    History of Present Illness 68 year old female admitted with Covid-19. PMH to include: IDDM, HTN, CKD, chronic back and knee pain, OSA on CPAP, CAD   Clinical Impression   Patient is currently requiring assistance with ADLs including total assist with toileting, max assist with LE bathing, moderate-max assist with LE dressing, and minimal assist with UE dressing, all of which is below patient's typical baseline of being Modified independent.  Pt also required mod-max assist to pivot from EOB to chair with unsteadiness and poor control on descent.  During this evaluation, patient was limited by generalized weakness, impaired activity tolerance but with vitals stable and SpO2 >93% on RA, as well as mild cognitive impairments, which has the potential to impact patient's safety and independence during functional mobility, as well as performance for ADLs. [place] "6-clicks" Daily Activity Inpatient Short Form score of 15/24 indicates 56.46% ADL impairment this session. Patient lives with her adult granddaughter and grandson, who are able to provide 24/7 supervision and assistance.  Patient demonstrates good rehab potential, and should benefit from continued skilled occupational therapy services while in acute care to maximize safety, independence and quality of life at home.  Continued occupational therapy services in a SNF setting prior to return home is recommended.  ?   Follow Up Recommendations  SNF    Equipment Recommendations       Recommendations for Other Services       Precautions / Restrictions Precautions Precautions: Fall Restrictions Weight Bearing Restrictions: No      Mobility Bed Mobility Overal bed mobility: Needs Assistance Bed Mobility: Supine to Sit     Supine to sit: Min assist;HOB elevated     General bed mobility  comments: Increased time for pt to come fully to EOB with Min As to scoot.    Transfers Overall transfer level: Needs assistance Equipment used: Rolling walker (2 wheeled) Transfers: Sit to/from [facility] Sit to Stand: Min assist Stand pivot transfers: Mod assist;+2 physical assistance       General transfer comment: Pt pivoted to recliner, very unsteady and need of Mod As to safely descend to recliner. Pt required frequen cues to widen BOS.    Balance Overall balance assessment: Needs assistance Sitting-balance support: Feet supported Sitting balance-Leahy Scale: Fair       Standing balance-Leahy Scale: Poor Standing balance comment: Reliant on RW and UE support to maintain stand.                           ADL either performed or assessed with clinical judgement   ADL Overall ADL's : Needs assistance/impaired Eating/Feeding: Sitting;Modified independent   Grooming: Sitting;Set up   Upper Body Bathing: Minimal assistance;Sitting   Lower Body Bathing: Moderate assistance;Sitting/lateral leans   Upper Body Dressing : Min guard;Set up;Sitting   Lower Body Dressing: Moderate assistance;Sitting/lateral leans Lower Body Dressing Details (indicate cue type and reason): Did not attempt standing LE dressing due to poor standing balance.   Toilet Transfer Details (indicate cue type and reason): Pivot to recliner. Please see Mobility section. Toileting- Clothing Manipulation and Hygiene: Total assistance Toileting - Clothing Manipulation Details (indicate cue type and reason): Baseline incontinent of bladder. Uses pull ups. Now on purewick.     Functional mobility during ADLs: Minimal assistance;Moderate assistance General ADL Comments: Min As bed; Mod As standing pivot  Vision Baseline Vision/History: Wears glasses Wears Glasses: At all times (Glasses not available.) Vision Assessment?: No apparent visual deficits     Perception      Praxis      Pertinent Vitals/Pain Pain Assessment: No/denies pain     Hand Dominance Right   Extremity/Trunk Assessment Upper Extremity Assessment Upper Extremity Assessment: Generalized weakness   Lower Extremity Assessment Lower Extremity Assessment: Defer to PT evaluation       Communication Communication Communication: No difficulties   Cognition Arousal/Alertness: Awake/alert Behavior During Therapy: WFL for tasks assessed/performed Overall Cognitive Status: Within Functional Limits for tasks assessed                                 General Comments: Pt was not aware of Covid diagnosis, but otherwise A&Ox4, pleasant and cooperative.   General Comments       Exercises     Shoulder Instructions      Home Living Family/patient expects to be discharged to:: Skilled nursing facility Living Arrangements: Other relatives                               Additional Comments: Lives in 1st floor apartment with Grandaughter and grandson who can provide 24/7 supervision.      Prior Functioning/Environment Level of Independence: Needs assistance  Gait / Transfers Assistance Needed: Mod I with Rollator ADL's / Homemaking Assistance Needed: Pt reports Mod I with BADLs  Uses BSC on top of toilet. Does not enter tub/shower, but sponge baths sitting on shower chair at sink. Grandkids provide cooking/cleaning/groceries/transportation or pt takes medical van.            OT Problem List: Decreased strength;Decreased activity tolerance;Decreased knowledge of use of DME or AE;Impaired balance (sitting and/or standing);Decreased knowledge of precautions      OT Treatment/Interventions: Self-care/ADL training;Therapeutic exercise;Therapeutic activities;Energy conservation;DME and/or AE instruction;Patient/family education;Balance training    OT Goals(Current goals can be found in the care plan section) Acute Rehab OT Goals Patient Stated Goal: Get  stronger OT Goal Formulation: With patient Time For Goal Achievement: 04/19/20 Potential to Achieve Goals: Good ADL Goals Pt Will Perform Grooming: standing;with supervision (for 1 task) Pt Will Perform Lower Body Bathing: with supervision;with adaptive equipment;sitting/lateral leans;sit to/from stand Pt Will Perform Lower Body Dressing: with adaptive equipment;sitting/lateral leans;sit to/from stand;with supervision;with set-up Pt Will Transfer to Toilet: with supervision;ambulating;bedside commode Pt Will Perform Toileting - Clothing Manipulation and hygiene: with adaptive equipment;with min guard assist;sitting/lateral leans;sit to/from stand Pt/caregiver will Perform Home Exercise Program: Both right and left upper extremity;Increased strength;With Supervision (with VSS)  OT Frequency: Min 2X/week   Barriers to D/C:            Co-evaluation              AM-PAC OT "6 Clicks" Daily Activity     Outcome Measure Help from another person eating meals?: None Help from another person taking care of personal grooming?: A Little Help from another person toileting, which includes using toliet, bedpan, or urinal?: Total Help from another person bathing (including washing, rinsing, drying)?: A Lot Help from another person to put on and taking off regular upper body clothing?: A Little Help from another person to put on and taking off regular lower body clothing?: A Lot 6 Click Score: 15   End of Session Equipment Utilized During Treatment: Gait belt;Rolling walker  Nurse Communication:  (RN cleared OT to see pt.)  Activity Tolerance: Patient tolerated treatment well Patient left: in chair;with call bell/phone within reach;with chair alarm set  OT Visit Diagnosis: Unsteadiness on feet (R26.81);History of falling (Z91.81);Muscle weakness (generalized) (M62.81)                Time: 5247-9980 OT Time Calculation (min): 31 min Charges:  OT General Charges $OT Visit: 1 Visit OT  Evaluation $OT Eval Low Complexity: 1 Low OT Treatments $Self Care/Home Management : 8-22 mins  Anderson Malta, OT Acute Rehab Services Office: [phone] 04/05/2020 Julien Girt 04/05/2020, 1:31 PM

## 2020-04-05 NOTE — Evaluation (Addendum)
Physical Therapy Evaluation Patient Details Name: Ann [name] MRN: [id number] DOB: Jun 19, 1950 Today's Date: 04/05/2020   History of Present Illness  69 year old female admitted with Covid-19. PMH to include: IDDM, HTN, CKD, chronic back and knee pain, OSA on CPAP, CAD  Clinical Impression  Pt admitted with above diagnosis. Pt very weak during eval, demonstrates fatigue by reclining back after seated EOB without back support for ~5 minutes and requires rest break once supine before being assisted into center of bed. Pt unable to take steps away from chair and bed, maintains back of BLE braced on bed when pivoting from chair back to bed. Pt SpO2 97-100% on RA with all mobility. Pt currently with functional limitations due to the deficits listed below (see PT Problem List). Pt will benefit from skilled PT to increase their independence and safety with mobility to allow discharge to the venue listed below.       Follow Up Recommendations SNF    Equipment Recommendations  None recommended by PT    Recommendations for Other Services       Precautions / Restrictions Precautions Precautions: Fall Restrictions Weight Bearing Restrictions: No      Mobility  Bed Mobility Overal bed mobility: Needs Assistance Bed Mobility: Sit to Supine Sit to supine: Min assist;Mod assist  General bed mobility comments: per OT eval pt min A for supine to sit; pt requires min A to lift BLE back into bed and mod A to reposition in center of bed    Transfers Overall transfer level: Needs assistance Equipment used: Rolling walker (2 wheeled) Transfers: Stand Pivot Transfers;Squat Pivot Transfers Sit to Stand: Mod assist;+2 physical assistance;+2 safety/equipment Stand pivot transfers: Mod assist;+2 physical assistance;+2 safety/equipment    General transfer comment: mod A to power up from seated in chair, assist to manage RW for few unsteady sidesteps/pivot over to bed while maintaining BLE braced on  chair and bed  Ambulation/Gait  General Gait Details: unable due to weakness  Stairs            Wheelchair Mobility    Modified Rankin (Stroke Patients Only)       Balance Overall balance assessment: Needs assistance Sitting-balance support: Feet supported Sitting balance-Leahy Scale: Fair  Standing balance-Leahy Scale: Poor Standing balance comment: reliant on RW and support            Pertinent Vitals/Pain Pain Assessment: No/denies pain    Home Living Family/patient expects to be discharged to:: Skilled nursing facility Living Arrangements: Other relatives               Additional Comments: Lives in 1st floor apartment with Grandaughter and grandson who can provide 24/7 supervision.    Prior Function Level of Independence: Needs assistance   Gait / Transfers Assistance Needed: Pt reports ambulating household and community distances with rollator walker.  ADL's / Homemaking Assistance Needed: Pt reports Mod I with BADLs.  Uses BSC on top of toilet. Does not enter tub/shower, but sponge baths sitting on shower chair at sink. Grandkids provide cooking/cleaning/groceries/transportation or pt takes medical van.        Hand Dominance   Dominant Hand: Right    Extremity/Trunk Assessment   Upper Extremity Assessment Upper Extremity Assessment: Defer to OT evaluation    Lower Extremity Assessment Lower Extremity Assessment: Generalized weakness (AROM WNL throughout, strength grossly 3+/5, denies numbness/tingling in BLE)    Cervical / Trunk Assessment Cervical / Trunk Assessment: Normal  Communication   Communication: No difficulties  Cognition Arousal/Alertness: Awake/alert Behavior During Therapy: WFL for tasks assessed/performed Overall Cognitive Status: Within Functional Limits for tasks assessed  General Comments: Pt A&O x4, but appears dazed while talking with therapist, denies abnormal feelings and just reports "I'm tired"      General  Comments General comments (skin integrity, edema, etc.): SpO2 97-100% on RA    Exercises     Assessment/Plan    PT Assessment Patient needs continued PT services  PT Problem List Decreased strength;Decreased activity tolerance;Decreased balance;Decreased mobility;Decreased cognition;Decreased knowledge of use of DME;Cardiopulmonary status limiting activity       PT Treatment Interventions DME instruction;Gait training;Functional mobility training;Therapeutic activities;Therapeutic exercise;Balance training;Neuromuscular re-education;Patient/family education    PT Goals (Current goals can be found in the Care Plan section)  Acute Rehab PT Goals Patient Stated Goal: Get stronger PT Goal Formulation: With patient Time For Goal Achievement: 04/19/20 Potential to Achieve Goals: Good    Frequency Min 2X/week   Barriers to discharge        Co-evaluation               AM-PAC PT "6 Clicks" Mobility  Outcome Measure Help needed turning from your back to your side while in a flat bed without using bedrails?: A Little Help needed moving from lying on your back to sitting on the side of a flat bed without using bedrails?: A Little Help needed moving to and from a bed to a chair (including a wheelchair)?: A Lot Help needed standing up from a chair using your arms (e.g., wheelchair or bedside chair)?: A Lot Help needed to walk in hospital room?: A Lot Help needed climbing 3-5 steps with a railing? : Total 6 Click Score: 13    End of Session Equipment Utilized During Treatment: Gait belt Activity Tolerance: Patient tolerated treatment well Patient left: in bed;with call bell/phone within reach;with bed alarm set Nurse Communication: Mobility status PT Visit Diagnosis: Unsteadiness on feet (R26.81);Other abnormalities of gait and mobility (R26.89);Muscle weakness (generalized) (M62.81)    Time: 0973-5329 PT Time Calculation (min) (ACUTE ONLY): 20 min   Charges:   PT  Evaluation $PT Eval Low Complexity: 1 Low           Tori Nishita Isaacks PT, DPT 04/05/20, 3:24 PM

## 2020-04-05 NOTE — Plan of Care (Signed)

## 2020-04-05 NOTE — Care Management Important Message (Signed)
Important Message  Patient Details IM Letter given to the Patient. Name: Ann [name] MRN: [id number] Date of Birth: Jun 29, 1950   Medicare Important Message Given:  Yes     Kerin Salen 04/05/2020, 11:51 AM

## 2020-04-05 NOTE — Progress Notes (Signed)
[facility] Progress Note  Subjective:  Patient not examined today directly given COVID-19 + status, utilizing data taken from chart +/- discussions w/ providers and staff.   No UOP recorded yest, creat up slightly today  Vitals:   04/04/20 1438 04/04/20 1950 04/05/20 0344 04/05/20 0931  BP: 139/83 136/67 128/69   Pulse: 90 78 80   Resp: 16 18 16    Temp: (!) 97.5 F (36.4 C) 98.1 F (36.7 C) 98.2 F (36.8 C)   TempSrc:  Oral Oral   SpO2: 94% 99% 97%   Weight:    95.7 kg  Height:        Exam:   Patient not examined today directly given COVID-19 + status, utilizing data taken from chart +/- discussions w/ providers and staff.       VSS on RA 97%  128/69  HR 80 RR 16    Renal US - Atrophic, echogenic kidneys compatible with chronic medical renal disease. No hydro.   Assessment/ Plan: 1. AKI on CKD IV - b/l craet 2.7- 3.3. AKI due to GI losses+diuretics+ACEi. Creat up today. Urinalysis with proteinuria, pyuria and bacteriuria. Looked dry on initial assessment, will lower IVF"s to 50 cc/hr.  No signs of uremia/ no indication for RRT at this time. Will follow.  2. COVID-19 infection - receiving IV abx for CAP, remdesivir.  3. HTN - on norvasc, hydralazine , imdur. BP's wnl. Continue.      Rob Abdi Husak 04/05/2020, 1:44 PM   Recent Labs  Lab 04/04/20 0046 04/05/20 0352  K 3.6 3.9  BUN 61* 82*  CREATININE 5.38* 6.03*  CALCIUM 8.3* 8.6*  PHOS 5.7* 6.1*  HGB 9.8* 9.0*   Inpatient medications: . amLODipine  10 mg Oral Daily  . vitamin C  500 mg Oral Daily  . aspirin EC  81 mg Oral Daily  . DULoxetine  60 mg Oral Daily  . gabapentin  100 mg Oral QHS  . heparin injection (subcutaneous)  7,500 Units Subcutaneous Q8H  . hydrALAZINE  50 mg Oral TID  . insulin aspart  0-20 Units Subcutaneous Q4H  . insulin detemir  0.1 Units/kg Subcutaneous BID  . isosorbide mononitrate  30 mg Oral Daily  . linagliptin  5 mg Oral QHS  . pantoprazole  20 mg Oral Daily  .  pravastatin  40 mg Oral q1800  . rOPINIRole  0.25 mg Oral QPM  . sodium bicarbonate  650 mg Oral BID  . zinc sulfate  220 mg Oral Daily   . remdesivir 100 mg in NS 100 mL 100 mg (04/05/20 1041)   acetaminophen **OR** acetaminophen, guaiFENesin-dextromethorphan, ondansetron **OR** ondansetron (ZOFRAN) IV

## 2020-04-06 ENCOUNTER — Encounter (HOSPITAL_COMMUNITY): Payer: Self-pay | Admitting: Family Medicine

## 2020-04-06 LAB — COMPREHENSIVE METABOLIC PANEL
ALT: 25 U/L (ref 0–44)
AST: 47 U/L — ABNORMAL HIGH (ref 15–41)
Albumin: 2.8 g/dL — ABNORMAL LOW (ref 3.5–5.0)
Alkaline Phosphatase: 47 U/L (ref 38–126)
Anion gap: 14 (ref 5–15)
BUN: 88 mg/dL — ABNORMAL HIGH (ref 8–23)
CO2: 20 mmol/L — ABNORMAL LOW (ref 22–32)
Calcium: 8.3 mg/dL — ABNORMAL LOW (ref 8.9–10.3)
Chloride: 107 mmol/L (ref 98–111)
Creatinine, Ser: 6.54 mg/dL — ABNORMAL HIGH (ref 0.44–1.00)
GFR, Estimated: 6 mL/min — ABNORMAL LOW (ref >60)
Glucose, Bld: 89 mg/dL (ref 70–99)
Potassium: 3.5 mmol/L (ref 3.5–5.1)
Sodium: 141 mmol/L (ref 135–145)
Total Bilirubin: 0.5 mg/dL (ref 0.3–1.2)
Total Protein: 6 g/dL — ABNORMAL LOW (ref 6.5–8.1)

## 2020-04-06 LAB — GLUCOSE, CAPILLARY
Glucose-Capillary: 93 mg/dL (ref 70–99)
Glucose-Capillary: 86 mg/dL (ref 70–99)
Glucose-Capillary: 75 mg/dL (ref 70–99)
Glucose-Capillary: 108 mg/dL — ABNORMAL HIGH (ref 70–99)
Glucose-Capillary: 93 mg/dL (ref 70–99)
Glucose-Capillary: 89 mg/dL (ref 70–99)

## 2020-04-06 LAB — CBC WITH DIFFERENTIAL/PLATELET
Abs Immature Granulocytes: 0.2 10*3/uL — ABNORMAL HIGH (ref 0.00–0.07)
Basophils Absolute: 0 10*3/uL (ref 0.0–0.1)
Basophils Relative: 0 %
Eosinophils Absolute: 0 10*3/uL (ref 0.0–0.5)
Eosinophils Relative: 0 %
HCT: 27.4 % — ABNORMAL LOW (ref 36.0–46.0)
Hemoglobin: 9 g/dL — ABNORMAL LOW (ref 12.0–15.0)
Immature Granulocytes: 3 %
Lymphocytes Relative: 13 %
Lymphs Abs: 0.8 10*3/uL (ref 0.7–4.0)
MCH: 28.9 pg (ref 26.0–34.0)
MCHC: 32.8 g/dL (ref 30.0–36.0)
MCV: 88.1 fL (ref 80.0–100.0)
Monocytes Absolute: 0.4 10*3/uL (ref 0.1–1.0)
Monocytes Relative: 7 %
Neutro Abs: 4.7 10*3/uL (ref 1.7–7.7)
Neutrophils Relative %: 77 %
Platelets: 182 10*3/uL (ref 150–400)
RBC: 3.11 MIL/uL — ABNORMAL LOW (ref 3.87–5.11)
RDW: 14.7 % (ref 11.5–15.5)
WBC: 6.1 10*3/uL (ref 4.0–10.5)
nRBC: 0 % (ref 0.0–0.2)

## 2020-04-06 LAB — C-REACTIVE PROTEIN: CRP: 7.8 mg/dL — ABNORMAL HIGH (ref <1.0)

## 2020-04-06 LAB — PHOSPHORUS: Phosphorus: 6.7 mg/dL — ABNORMAL HIGH (ref 2.5–4.6)

## 2020-04-06 LAB — MAGNESIUM: Magnesium: 2.4 mg/dL (ref 1.7–2.4)

## 2020-04-06 LAB — D-DIMER, QUANTITATIVE: D-Dimer, Quant: 2.31 ug/mL-FEU — ABNORMAL HIGH (ref 0.00–0.50)

## 2020-04-06 MED ORDER — SODIUM CHLORIDE 0.9 % IV SOLN
1.0000 g | Freq: Every day | INTRAVENOUS | Status: AC
  Administered 2020-04-06 – 2020-04-08 (×3): 1 g via INTRAVENOUS
  Filled 2020-04-06: qty 1
  Filled 2020-04-06 (×2): qty 10

## 2020-04-06 NOTE — TOC Initial Note (Signed)
Transition of Care [facility]) - Initial/Assessment Note    Patient Details  Name: Ann [name] MRN: [id number] Date of Birth: [birth year]  Transition of Care [facility]) CM/SW Contact:    Trish Mage, LCSW Phone Number: 04/06/2020, 10:09 AM  Clinical Narrative:  Patient was called at the number listed for her in chart, [phone], in response to PT/OT recommendation of SNF.    Grandaughter answered and deferred to her mother.   I spoke with daughter [name] at [phone].  She states that her daughter, patient's grandaughter, is full time caregiver for Ms [place].  Prior to falling ill, she was able to do transfers and get to the bathroom on her own.  I explained that according to PT, OT notes, she is now not able to even sit up in the bed on her own.  Daughter feels she just needs another day or so to be able to return home.  I explained that she likely will not recover that quickly, but daughter was still unconvinced that SNF is in her best interest. MD informed. Daughter asked for order for wheelchair, bedside commode and HH PT, OT, RN. TOC will continue to follow during the course of hospitalization.         Expected Discharge Plan: Skilled Nursing Facility Barriers to Discharge: SNF Pending bed offer   Patient Goals and CMS Choice     Choice offered to / list presented to : Adult Children  Expected Discharge Plan and Services Expected Discharge Plan: [place]   Discharge Planning Services: CM Consult Post Acute Care Choice: [place] arrangements for the past 2 months: Apartment                                      Prior Living Arrangements/Services Living arrangements for the past 2 months: Apartment Lives with:: Relatives Advertising account executive) Patient language and need for interpreter reviewed:: Yes        Need for Family Participation in Patient Care: Yes (Comment) Care giver support system in place?: Yes (comment)   Criminal Activity/Legal  Involvement Pertinent to Current Situation/Hospitalization: No - Comment as needed  Activities of Daily Living Home Assistive Devices/Equipment: Eyeglasses,Other (Comment) (left arm chip. blood glucose reader) ADL Screening (condition at time of admission) Patient's cognitive ability adequate to safely complete daily activities?: Yes Is the patient deaf or have difficulty hearing?: No Does the patient have difficulty seeing, even when wearing glasses/contacts?: No Does the patient have difficulty concentrating, remembering, or making decisions?: No Patient able to express need for assistance with ADLs?: Yes Does the patient have difficulty dressing or bathing?: Yes Independently performs ADLs?: No Communication: Independent Dressing (OT): Independent Grooming: Independent Feeding: Independent Bathing: Independent Toileting: Independent In/Out Bed: Independent Walks in Home: Independent with device (comment) Does the patient have difficulty walking or climbing stairs?: Yes Weakness of Legs: Both Weakness of Arms/Hands: Both  Permission Sought/Granted Permission sought to share information with : Family Supports Permission granted to share information with : No  Share Information with NAME: Sweis,Felicia (Daughter)   [phone]           Emotional Assessment Appearance:: Appears stated age     Orientation: : Oriented to Self,Oriented to Place,Oriented to Situation,Oriented to  Time Alcohol / Substance Use: Not Applicable Psych Involvement: No (comment)  Admission diagnosis:  Weakness [R53.1] AKI (acute kidney injury) ([place]) [N17.9] COVID [U07.1] 2019 novel coronavirus disease (COVID-19) [U07.1]  Acute renal failure [facility]) [N17.9] Patient Active Problem List   Diagnosis Date Noted  . 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 AKI (acute kidney injury) () 04/04/2020  . Acute renal failure ([place]) 04/04/2020  . 2019 novel coronavirus disease (COVID-19) 04/03/2020  . GERD  (gastroesophageal reflux disease)   . Constipation 02/25/2019  . Pyelonephritis 02/20/2019  . Acute encephalopathy 02/20/2019  . Chronic pain 02/20/2019  . Obesity, Class III, BMI 40-49.9 (morbid obesity) ([place]) 01/28/2013  . Uncontrolled type II diabetes mellitus with hyperglycemia ([place]) 03/25/2012  . Health care maintenance 03/25/2012  . Acute blood loss anemia 12/01/2011    Class: Acute  . 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 Plantar fasciitis of right foot 09/28/2011  . Breast pain 03/08/2011  . 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 Knee pain, right 01/20/2011  . Thoracic spine pain 12/13/2010  . Hyperlipidemia 10/21/2009  . CKD (chronic kidney disease), stage IV ([place]) 10/20/2009  . UNSPECIFIED ANEMIA 09/29/2008  . HYPERTENSION 09/29/2008  . CAD (coronary artery disease) 09/29/2008  . ASTHMA, UNSPECIFIED, UNSPECIFIED STATUS 09/29/2008   PCP:  [name], MD Pharmacy:   [facility], [place] [address] [place] [address] Phone: [phone] Fax: [place], [employer] [address] [place] [address] Phone: [phone] Fax: [place], [place] - [address] [place] [address] Phone: [phone] Fax: [phone]     Social Determinants of Health (SDOH) Interventions    Readmission Risk Interventions Readmission Risk Prevention Plan 02/23/2019  Transportation Screening Complete  PCP or Specialist Appt within 5-7 Days Complete  Home Care Screening Not Complete  Home Care Screening Not Completed Comments NA  Medication Review (RN CM) Not Complete  Med Review comments NA  Some recent data might be hidden

## 2020-04-06 NOTE — Progress Notes (Signed)
PROGRESS NOTE  Ann [name]  JGO:[id number] DOB: Sep 13, 1950 DOA: 04/03/2020 PCP: [name], MD  Outpatient Specialists: Dr. Hollie Salk, nephrology Brief Narrative: Ann [name] is a 69 y.o. female with with a history of stage IV CKD with associated anemia, CAD, T2DM, HTN, HLD, GERD, asthma, OSA on CPAP, chronic lumbar back pain who presented with nausea, diarrhea, fever, chills, cough, and severe fatigue worsening since exposure to family member recently discharged for covid-19 pneumonia. In the ED 12/11, she was felt to be dehydrated with Cr up to 5.23 from baseline in 2's, bicarbonate 21, BUN 60. AST 86. CRP 16.1, PCT negative. SARS-CoV-2 PCR confirmed positivity and CXR demonstrated mild airspace opacity at the right base. She was given IV fluids, antibiotics which were later stopped, and remdesivir for covid-19 pneumonia and admitted for AKI with nephrology consulting.  Assessment & Plan: Principal Problem:   2019 novel coronavirus disease (COVID-19) Active Problems:   Hyperlipidemia   HYPERTENSION   CAD (coronary artery disease)   CKD (chronic kidney disease), stage IV (HCC)   Uncontrolled type II diabetes mellitus with hyperglycemia (HCC)   Obesity, Class III, BMI 40-49.9 (morbid obesity) (HCC)   GERD (gastroesophageal reflux disease)   AV node arrhythmia   AKI (acute kidney injury) ([place])   Acute renal failure (HCC)  AKI on stage IV CKD: Follows with Dr. Hollie Salk, planning vein mapping and access placement 04/14/2020. - Renal function steadily worsening, beginning to get more tired (muddy picture with concomitant covid) but shaking as well. Continuing IVF, avoiding nephrotoxins. Nephrology is consulted to inform decisions regarding dialysis initiation.   Covid-19 pneumonia: SARS-CoV-2 PCR positive on 12/11. Initially put on abx, though discontinued with positive covid swab and negative PCT. - Will continue remdesivir  - Stopped steroids with absence of hypoxia. Inflammatory markers  elevated but downtrending.  - IS - Continue airborne, contact precautions for 21 days from positive testing. - Intermediate dose heparin ordered given renal failure and severity of inflammation/suspected hypercoagulability   UTI: +symptoms and pyuria.  - CTX x3 days  T2DM: HbA1c 6.6% - levemir + SSI improving control.   CAD: No chest pain. Cardiology consulted for telemetry abnormalities felt to be insignificant.  - Continue ASA, statin, imdur.  HTN: Avoid hypotension. - Holding RAS agent and diuretics - Continue hydralazine, imdur  HLD:  - Continue statin  GERD:  - Continue PPI  Morbid obesity: Estimated body mass index is 39.8 kg/m as calculated from the following:   Height as of this encounter: 5\' 2"  (1.575 m).   Weight as of this encounter: 98.7 kg.  DVT prophylaxis: Heparin 7,500 units q8h Code Status: Full Family Communication: Granddaughter on phone during encounter today. Pt again declined my offer to call daughter. Disposition Plan:  Status is: Inpatient  Remains inpatient appropriate because:Persistent severe electrolyte disturbances, Ongoing diagnostic testing needed not appropriate for outpatient work up and Inpatient level of care appropriate due to severity of illness  Dispo: The patient is from: Home              Anticipated d/c is to: SNF              Anticipated d/c date is: > 3 days, depends on renal recovery              Patient currently is not medically stable to d/c.  Consultants:   Nephrology  Cardiology, Dr. Radford Pax  Procedures:   None  Antimicrobials:  Ceftriaxone, azithromycin 12/11  Remdesivir 12/11 >>  Subjective: Feeling diffusely weak which has progressed gradually starting before admission. Also feels her involuntary shaking has worsened over past 24 hours. Fatigued, not eating due to poor appetite, but denies nausea or vomiting. midlower abdominal pain has developed which is worse with suprapubic palpation, also some burning  on urination. Had pyuria on UA.  Objective: Vitals:   04/05/20 1347 04/05/20 1958 04/06/20 0340 04/06/20 0449  BP: 137/78 140/78 (!) 141/69   Pulse: 81 80 80   Resp: 17 18 18    Temp: 97.7 F (36.5 C) 98.1 F (36.7 C) 97.8 F (36.6 C)   TempSrc:  Oral Oral   SpO2: 97% 95% 95%   Weight:    98.7 kg  Height:        Intake/Output Summary (Last 24 hours) at 04/06/2020 1049 Last data filed at 04/06/2020 0800 Gross per 24 hour  Intake 1066.42 ml  Output 350 ml  Net 716.42 ml   Filed Weights   04/03/20 2257 04/05/20 0931 04/06/20 0449  Weight: 95.8 kg 95.7 kg 98.7 kg   Gen: Tired-appearing but interactive, not lethargic female in no distress Pulm: Nonlabored breathing room air. Clear. CV: Regular rate and rhythm. No murmur, rub, or gallop. No JVD, no dependent edema. GI: Abdomen soft, +suprapubic tenderness without rebound or guarding, otherwise non-tender, non-distended, with normoactive bowel sounds.  Ext: Warm, no deformities Skin: No rashes, lesions or ulcers on visualized skin. Neuro: Alert and oriented, mentating normally. +involuntary shaking of upper > lower extremities without overt asterixis on exam. No focal neurological deficits. Psych: Judgement and insight appear fair. Mood euthymic & affect congruent. Behavior is appropriate.    Data Reviewed: I have personally reviewed following labs and imaging studies  CBC: Recent Labs  Lab 04/03/20 1455 04/04/20 0046 04/05/20 0352 04/06/20 0343  WBC 6.0 4.0 4.7 6.1  NEUTROABS  --  2.3 3.5 4.7  HGB 11.2* 9.8* 9.0* 9.0*  HCT 34.4* 30.5* 28.0* 27.4*  MCV 87.8 88.7 87.5 88.1  PLT 166 142* 166 416   Basic Metabolic Panel: Recent Labs  Lab 04/03/20 1455 04/04/20 0046 04/05/20 0352 04/06/20 0343  NA 140 141 141 141  K 4.1 3.6 3.9 3.5  CL 103 105 106 107  CO2 21* 20* 18* 20*  GLUCOSE 187* 123* 109* 89  BUN 60* 61* 82* 88*  CREATININE 5.23* 5.38* 6.03* 6.54*  CALCIUM 8.8* 8.3* 8.6* 8.3*  MG  --  2.2 2.2 2.4  PHOS   --  5.7* 6.1* 6.7*   GFR: Estimated Creatinine Clearance: 8.8 mL/min (A) (by C-G formula based on SCr of 6.54 mg/dL (H)). Liver Function Tests: Recent Labs  Lab 04/03/20 1455 04/04/20 0046 04/05/20 0352 04/06/20 0343  AST 86* 79* 57* 47*  ALT 42 36 30 25  ALKPHOS 61 49 45 47  BILITOT 0.3 0.6 0.3 0.5  PROT 7.7 6.4* 6.1* 6.0*  ALBUMIN 3.2* 2.7* 2.7* 2.8*   HbA1C: Recent Labs    04/04/20 0046  HGBA1C 6.6*   CBG: Recent Labs  Lab 04/05/20 1618 04/05/20 1955 04/05/20 2353 04/06/20 0346 04/06/20 0720  GLUCAP 183* 116* 93 93 86   Lipid Profile: Recent Labs    04/03/20 2105  TRIG 216*   Thyroid Function Tests: No results for input(s): TSH, T4TOTAL, FREET4, T3FREE, THYROIDAB in the last 72 hours. Anemia Panel: Recent Labs    04/04/20 0046 04/05/20 0352  FERRITIN 974* 1,323*   Urine analysis:    Component Value Date/Time   COLORURINE AMBER (A) 04/03/2020 1455  APPEARANCEUR CLOUDY (A) 04/03/2020 1455   LABSPEC 1.014 04/03/2020 1455   PHURINE 5.0 04/03/2020 1455   GLUCOSEU NEGATIVE 04/03/2020 1455   HGBUR SMALL (A) 04/03/2020 1455   BILIRUBINUR NEGATIVE 04/03/2020 1455   KETONESUR NEGATIVE 04/03/2020 1455   PROTEINUR >=300 (A) 04/03/2020 1455   UROBILINOGEN 0.2 11/21/2011 1213   NITRITE NEGATIVE 04/03/2020 1455   LEUKOCYTESUR NEGATIVE 04/03/2020 1455   Recent Results (from the past 240 hour(s))  Resp Panel by RT-PCR (Flu A&B, Covid) Nasopharyngeal Swab     Status: Abnormal   Collection Time: 04/03/20  2:56 PM   Specimen: Nasopharyngeal Swab; Nasopharyngeal(NP) swabs in vial transport medium  Result Value Ref Range Status   SARS Coronavirus 2 by RT PCR POSITIVE (A) NEGATIVE Final    Comment: RESULT CALLED TO, READ BACK BY AND VERIFIED WITH: DR RAY @ 2005 04/03/20 [facility] (NOTE) SARS-CoV-2 target nucleic acids are DETECTED.  The SARS-CoV-2 RNA is generally detectable in upper respiratory specimens during the acute phase of infection. Positive results  are indicative of the presence of the identified virus, but do not rule out bacterial infection or co-infection with other pathogens not detected by the test. Clinical correlation with patient history and other diagnostic information is necessary to determine patient infection status. The expected result is Negative.  Fact Sheet for Patients: EntrepreneurPulse.com.au  Fact Sheet for Healthcare Providers: IncredibleEmployment.be  This test is not yet approved or cleared by the [place] FDA and  has been authorized for detection and/or diagnosis of SARS-CoV-2 by FDA under an Emergency Use Authorization (EUA).  This EUA will remain in effect (meaning this test can be used)  for the duration of  the COVID-19 declaration under Section 564(b)(1) of the Act, 21 U.S.C. section 360bbb-3(b)(1), unless the authorization is terminated or revoked sooner.     Influenza A by PCR NEGATIVE NEGATIVE Final   Influenza B by PCR NEGATIVE NEGATIVE Final    Comment: (NOTE) The Xpert Xpress SARS-CoV-2/FLU/RSV plus assay is intended as an aid in the diagnosis of influenza from Nasopharyngeal swab specimens and should not be used as a sole basis for treatment. Nasal washings and aspirates are unacceptable for Xpert Xpress SARS-CoV-2/FLU/RSV testing.  Fact Sheet for Patients: EntrepreneurPulse.com.au  Fact Sheet for Healthcare Providers: IncredibleEmployment.be  This test is not yet approved or cleared by the [place] FDA and has been authorized for detection and/or diagnosis of SARS-CoV-2 by FDA under an Emergency Use Authorization (EUA). This EUA will remain in effect (meaning this test can be used) for the duration of the COVID-19 declaration under Section 564(b)(1) of the Act, 21 U.S.C. section 360bbb-3(b)(1), unless the authorization is terminated or revoked.  Performed at [facility], [place]  [address] [place], [place] [address]   Blood culture (routine x 2)     Status: None (Preliminary result)   Collection Time: 04/03/20  3:00 PM   Specimen: BLOOD  Result Value Ref Range Status   Specimen Description   Final    BLOOD RIGHT ANTECUBITAL Performed at [facility] [address], [place], [place] [address]    Special Requests   Final    BOTTLES DRAWN AEROBIC AND ANAEROBIC Blood Culture adequate volume Performed at [facility] [address] [place], [place] [address]    Culture   Final    NO GROWTH 2 DAYS Performed at [facility] [address], [place], [place] [address]    Report Status PENDING  Incomplete  Blood  culture (routine x 2)     Status: None (Preliminary result)   Collection Time: 04/03/20  3:41 PM   Specimen: BLOOD  Result Value Ref Range Status   Specimen Description   Final    BLOOD BLOOD LEFT HAND Performed at [facility] [address], [place], [place] [address]    Special Requests   Final    BOTTLES DRAWN AEROBIC AND ANAEROBIC Blood Culture adequate volume Performed at [facility] [address], [place], [place] [address]    Culture   Final    NO GROWTH 2 DAYS Performed at [facility] [address], [place], [place] [address]    Report Status PENDING  Incomplete      Radiology Studies: US RENAL  Result Date: 04/04/2020 CLINICAL DATA:  Acute kidney injury EXAM: RENAL / URINARY TRACT ULTRASOUND COMPLETE COMPARISON:  CT 04/03/2020 FINDINGS: Technical note: Significantly limited examination secondary to poor penetration related to patient body habitus and shadowing from overlying bowel gas. Right Kidney: Limited. Echogenic renal parenchyma with cortical thinning compatible with chronic medical renal disease. Right renal length of approximately 10.6 cm. No shadowing stone or hydronephrosis is identified. Left Kidney: Very limited evaluation.  Visualized left kidney appears diffusely echogenic with renal cortical thinning compatible with chronic medical renal disease. No obvious hydronephrosis. Bladder: Appears normal for degree of bladder distention. Other: None. IMPRESSION: 1. Limited exam. 2. No sonographic evidence of obstructive uropathy. 3. Atrophic, echogenic kidneys compatible with chronic medical renal disease. Electronically Signed   By: Davina Poke D.O.   On: 04/04/2020 12:18   ECHOCARDIOGRAM COMPLETE  Result Date: 04/04/2020    ECHOCARDIOGRAM REPORT   Patient Name:   JORDANN GRIME Levels Date of Exam: 04/04/2020 Medical Rec #:  [id number]     Height:       62.0 in Accession #:    [id number]    Weight:       211.2 lb Date of Birth:  [birth year]     BSA:          1.956 m Patient Age:    44 years      BP:           146/78 mmHg Patient Gender: F             HR:           82 bpm. Exam Location:  Inpatient Procedure: 2D Echo, Cardiac Doppler, Color Doppler and Intracardiac            Opacification Agent Indications:    Abnormal ECG 794.31 / R94.31  History:        Patient has prior history of Echocardiogram examinations, most                 recent 11/11/1996. CAD, Signs/Symptoms:Shortness of Breath and                 Chest Pain; Risk Factors:Hypertension, Diabetes and                 Dyslipidemia.  Sonographer:    Bernadene Person RDCS Referring Phys: [id number] [place]  1. Left ventricular ejection fraction, by estimation, is 60 to 65%. The left ventricle has normal function. The left ventricle has no regional wall motion abnormalities. Left ventricular diastolic parameters are consistent with Grade II diastolic dysfunction (pseudonormalization). Elevated left ventricular end-diastolic pressure.  2. Right ventricular systolic function is normal. The right ventricular size is normal. There is  normal pulmonary artery systolic pressure.  3. The mitral valve is degenerative. Trivial mitral valve regurgitation. No evidence of mitral  stenosis. Moderate mitral annular calcification.  4. The aortic valve is tricuspid. Aortic valve regurgitation is not visualized. Mild to moderate aortic valve sclerosis/calcification is present, without any evidence of aortic stenosis.  5. The inferior vena cava is normal in size with greater than 50% respiratory variability, suggesting right atrial pressure of 3 mmHg. FINDINGS  Left Ventricle: Left ventricular ejection fraction, by estimation, is 60 to 65%. The left ventricle has normal function. The left ventricle has no regional wall motion abnormalities. Definity contrast agent was given IV to delineate the left ventricular  endocardial borders. The left ventricular internal cavity size was normal in size. There is no left ventricular hypertrophy. Left ventricular diastolic parameters are consistent with Grade II diastolic dysfunction (pseudonormalization). Elevated left ventricular end-diastolic pressure. Right Ventricle: The right ventricular size is normal. No increase in right ventricular wall thickness. Right ventricular systolic function is normal. There is normal pulmonary artery systolic pressure. The tricuspid regurgitant velocity is 2.75 m/s, and  with an assumed right atrial pressure of 3 mmHg, the estimated right ventricular systolic pressure is 58.0 mmHg. Left Atrium: Left atrial size was normal in size. Right Atrium: Right atrial size was normal in size. Pericardium: There is no evidence of pericardial effusion. Mitral Valve: The mitral valve is degenerative in appearance. There is moderate thickening of the mitral valve leaflet(s). There is moderate calcification of the mitral valve leaflet(s). Moderate mitral annular calcification. Trivial mitral valve regurgitation. No evidence of mitral valve stenosis. Tricuspid Valve: The tricuspid valve is normal in structure. Tricuspid valve regurgitation is mild . No evidence of tricuspid stenosis. Aortic Valve: The aortic valve is tricuspid. Aortic valve  regurgitation is not visualized. Mild to moderate aortic valve sclerosis/calcification is present, without any evidence of aortic stenosis. Pulmonic Valve: The pulmonic valve was normal in structure. Pulmonic valve regurgitation is not visualized. No evidence of pulmonic stenosis. Aorta: The aortic root is normal in size and structure. Venous: The inferior vena cava is normal in size with greater than 50% respiratory variability, suggesting right atrial pressure of 3 mmHg. IAS/Shunts: No atrial level shunt detected by color flow Doppler.  LEFT VENTRICLE PLAX 2D LVIDd:         5.80 cm  Diastology LVIDs:         4.10 cm  LV e' medial:    3.81 cm/s LV PW:         0.70 cm  LV E/e' medial:  24.3 LV IVS:        1.00 cm  LV e' lateral:   4.57 cm/s LVOT diam:     2.00 cm  LV E/e' lateral: 20.2 LV SV:         78 LV SV Index:   40 LVOT Area:     3.14 cm  RIGHT VENTRICLE RV S prime:     13.10 cm/s TAPSE (M-mode): 2.0 cm LEFT ATRIUM             Index       RIGHT ATRIUM           Index LA diam:        3.40 cm 1.74 cm/m  RA Area:     18.10 cm LA Vol (A2C):   56.0 ml 28.62 ml/m RA Volume:   47.40 ml  24.23 ml/m LA Vol (A4C):   51.1 ml 26.12 ml/m LA Biplane Vol: 53.4  ml 27.29 ml/m  AORTIC VALVE LVOT Vmax:   104.00 cm/s LVOT Vmean:  86.300 cm/s LVOT VTI:    0.249 m  AORTA Ao Root diam: 3.30 cm Ao Asc diam:  3.60 cm MITRAL VALVE                TRICUSPID VALVE MV Area (PHT): 2.48 cm     TR Peak grad:   30.2 mmHg MV Decel Time: 306 msec     TR Vmax:        275.00 cm/s MV E velocity: 92.40 cm/s MV A velocity: 138.00 cm/s  SHUNTS MV E/A ratio:  0.67         Systemic VTI:  0.25 m                             Systemic Diam: 2.00 cm Jenkins Rouge MD Electronically signed by Jenkins Rouge MD Signature Date/Time: 04/04/2020/11:23:04 AM    Final     Scheduled Meds: . amLODipine  10 mg Oral Daily  . vitamin C  500 mg Oral Daily  . aspirin EC  81 mg Oral Daily  . DULoxetine  60 mg Oral Daily  . gabapentin  100 mg Oral QHS  .  heparin injection (subcutaneous)  7,500 Units Subcutaneous Q8H  . hydrALAZINE  50 mg Oral TID  . insulin aspart  0-20 Units Subcutaneous Q4H  . insulin detemir  0.1 Units/kg Subcutaneous BID  . isosorbide mononitrate  30 mg Oral Daily  . linagliptin  5 mg Oral QHS  . pantoprazole  20 mg Oral Daily  . pravastatin  40 mg Oral q1800  . rOPINIRole  0.25 mg Oral QPM  . sodium bicarbonate  650 mg Oral BID  . zinc sulfate  220 mg Oral Daily   Continuous Infusions: . sodium chloride 50 mL/hr at 04/06/20 0536  . cefTRIAXone (ROCEPHIN)  IV    . remdesivir 100 mg in NS 100 mL 100 mg (04/06/20 1010)     LOS: 2 days   Time spent: 35 minutes.  Patrecia Pour, MD [facility] [email or web address] 04/06/2020, 10:49 AM

## 2020-04-06 NOTE — Progress Notes (Signed)
[facility] Progress Note  Subjective:  Pt seen in room, no jerking or confusion or N/V.  Not eating a lot per staff.  350 cc UOP yest recorded. Creat up 6 > 6.5 today  Vitals:   04/05/20 1347 04/05/20 1958 04/06/20 0340 04/06/20 0449  BP: 137/78 140/78 (!) 141/69   Pulse: 81 80 80   Resp: 17 18 18    Temp: 97.7 F (36.5 C) 98.1 F (36.7 C) 97.8 F (36.6 C)   TempSrc:  Oral Oral   SpO2: 97% 95% 95%   Weight:    98.7 kg  Height:        Exam:   alert, nad   no jvd  Chest cta bilat  Cor reg no RG  Abd soft ntnd no ascites   Ext no LE edema   Alert, NF, ox3       VSS on RA 97%  128/69  HR 80 RR 16    Renal US - Atrophic, echogenic kidneys compatible with chronic medical renal disease. No hydro.   Assessment/ Plan: 1. AKI on CKD IV - b/l creat 2.7- 3.3. AKI due to GI losses+diuretics+ACEi. Creat up today. Urinalysis with proteinuria, pyuria and bacteriuria. Looked dry on initial assessment. Will cont IVF's , Babe.Bellis / hr. Creat up today but still no uremic symptoms, hopefully will recover soon.  2. COVID-19 infection - receiving IV abx for CAP, remdesivir.  3. HTN - on norvasc, hydralazine , imdur. BP's wnl. Continue.      Rob Shaquasia Caponigro 04/06/2020, 12:50 PM   Recent Labs  Lab 04/05/20 0352 04/06/20 0343  K 3.9 3.5  BUN 82* 88*  CREATININE 6.03* 6.54*  CALCIUM 8.6* 8.3*  PHOS 6.1* 6.7*  HGB 9.0* 9.0*   Inpatient medications: . amLODipine  10 mg Oral Daily  . vitamin C  500 mg Oral Daily  . aspirin EC  81 mg Oral Daily  . DULoxetine  60 mg Oral Daily  . gabapentin  100 mg Oral QHS  . heparin injection (subcutaneous)  7,500 Units Subcutaneous Q8H  . hydrALAZINE  50 mg Oral TID  . insulin aspart  0-20 Units Subcutaneous Q4H  . insulin detemir  0.1 Units/kg Subcutaneous BID  . isosorbide mononitrate  30 mg Oral Daily  . linagliptin  5 mg Oral QHS  . pantoprazole  20 mg Oral Daily  . pravastatin  40 mg Oral q1800  . rOPINIRole  0.25 mg Oral QPM  .  sodium bicarbonate  650 mg Oral BID  . zinc sulfate  220 mg Oral Daily   . sodium chloride 50 mL/hr at 04/06/20 0536  . cefTRIAXone (ROCEPHIN)  IV 1 g (04/06/20 1127)  . remdesivir 100 mg in NS 100 mL 100 mg (04/06/20 1010)   acetaminophen **OR** acetaminophen, guaiFENesin-dextromethorphan, ondansetron **OR** ondansetron (ZOFRAN) IV

## 2020-04-06 NOTE — Plan of Care (Signed)

## 2020-04-06 NOTE — Plan of Care (Signed)

## 2020-04-07 DIAGNOSIS — N179 Acute kidney failure, unspecified: Secondary | ICD-10-CM

## 2020-04-07 LAB — GLUCOSE, CAPILLARY
Glucose-Capillary: 71 mg/dL (ref 70–99)
Glucose-Capillary: 71 mg/dL (ref 70–99)
Glucose-Capillary: 93 mg/dL (ref 70–99)
Glucose-Capillary: 103 mg/dL — ABNORMAL HIGH (ref 70–99)
Glucose-Capillary: 74 mg/dL (ref 70–99)

## 2020-04-07 LAB — RENAL FUNCTION PANEL
Albumin: 2.6 g/dL — ABNORMAL LOW (ref 3.5–5.0)
Anion gap: 17 — ABNORMAL HIGH (ref 5–15)
BUN: 92 mg/dL — ABNORMAL HIGH (ref 8–23)
CO2: 17 mmol/L — ABNORMAL LOW (ref 22–32)
Calcium: 7.9 mg/dL — ABNORMAL LOW (ref 8.9–10.3)
Chloride: 109 mmol/L (ref 98–111)
Creatinine, Ser: 6.46 mg/dL — ABNORMAL HIGH (ref 0.44–1.00)
GFR, Estimated: 6 mL/min — ABNORMAL LOW (ref >60)
Glucose, Bld: 74 mg/dL (ref 70–99)
Phosphorus: 6.4 mg/dL — ABNORMAL HIGH (ref 2.5–4.6)
Potassium: 3.6 mmol/L (ref 3.5–5.1)
Sodium: 143 mmol/L (ref 135–145)

## 2020-04-07 LAB — D-DIMER, QUANTITATIVE: D-Dimer, Quant: 2.01 ug/mL-FEU — ABNORMAL HIGH (ref 0.00–0.50)

## 2020-04-07 LAB — C-REACTIVE PROTEIN: CRP: 7.8 mg/dL — ABNORMAL HIGH (ref <1.0)

## 2020-04-07 MED ORDER — INSULIN DETEMIR 100 UNIT/ML ~~LOC~~ SOLN
10.0000 [IU] | Freq: Every day | SUBCUTANEOUS | Status: DC
  Administered 2020-04-08 – 2020-04-09 (×2): 10 [IU] via SUBCUTANEOUS
  Filled 2020-04-07 (×2): qty 0.1

## 2020-04-07 MED ORDER — INSULIN ASPART 100 UNIT/ML ~~LOC~~ SOLN
0.0000 [IU] | Freq: Three times a day (TID) | SUBCUTANEOUS | Status: DC
  Administered 2020-04-09: 1 [IU] via SUBCUTANEOUS

## 2020-04-07 MED ORDER — INSULIN ASPART 100 UNIT/ML ~~LOC~~ SOLN: 0.0000 [IU] | Freq: Every day | SUBCUTANEOUS | Status: DC

## 2020-04-07 MED ORDER — NYSTATIN 100000 UNIT/GM EX POWD
Freq: Three times a day (TID) | CUTANEOUS | Status: DC
  Filled 2020-04-07 (×2): qty 15

## 2020-04-07 NOTE — Progress Notes (Signed)
[facility] Progress Note  Subjective:  Pt seen in room, in good spirits, creat down 6.4 today. No n/v or SOB.   Vitals:   04/06/20 1358 04/06/20 2210 04/07/20 0300 04/07/20 0323  BP: 133/86 (!) 154/75  (!) 161/69  Pulse: 97 95  93  Resp: 20 20  17   Temp: 98 F (36.7 C) 98.4 F (36.9 C)  98.6 F (37 C)  TempSrc:      SpO2: 95% 99% 99% 97%  Weight:      Height:        Exam:   alert, nad   no jvd  Chest cta bilat  Cor reg no RG  Abd soft ntnd no ascites   Ext no LE edema   Alert, NF, ox3       VSS on RA 97%  128/69  HR 80 RR 16    Renal US - Atrophic, echogenic kidneys compatible with chronic medical renal disease. No hydro.   Assessment/ Plan: 1. AKI on CKD IV - b/l creat 2.7- 3.3. AKI due to GI losses+diuretics+ACEi. Urinalysis with proteinuria, pyuria and bacteriuria. Creat 5.3 on admit > 6.03 and started IVF's > 6.5 yest and > 6.4 today.  Will cont IVF"s. No edema on exam. Will follow.  2. COVID-19 infection - receiving IV abx for CAP, remdesivir.  3. HTN - on norvasc, hydralazine , imdur. BP's wnl. Continue.      Rob Jaeleen Inzunza 04/07/2020, 11:03 AM   Recent Labs  Lab 04/05/20 0352 04/06/20 0343 04/07/20 0549  K 3.9 3.5 3.6  BUN 82* 88* 92*  CREATININE 6.03* 6.54* 6.46*  CALCIUM 8.6* 8.3* 7.9*  PHOS 6.1* 6.7* 6.4*  HGB 9.0* 9.0*  --    Inpatient medications: . amLODipine  10 mg Oral Daily  . vitamin C  500 mg Oral Daily  . aspirin EC  81 mg Oral Daily  . DULoxetine  60 mg Oral Daily  . gabapentin  100 mg Oral QHS  . heparin injection (subcutaneous)  7,500 Units Subcutaneous Q8H  . hydrALAZINE  50 mg Oral TID  . insulin aspart  0-20 Units Subcutaneous Q4H  . insulin detemir  0.1 Units/kg Subcutaneous BID  . isosorbide mononitrate  30 mg Oral Daily  . linagliptin  5 mg Oral QHS  . pantoprazole  20 mg Oral Daily  . pravastatin  40 mg Oral q1800  . rOPINIRole  0.25 mg Oral QPM  . sodium bicarbonate  650 mg Oral BID  . zinc sulfate   220 mg Oral Daily   . sodium chloride 65 mL/hr at 04/07/20 0616  . cefTRIAXone (ROCEPHIN)  IV Stopped (04/06/20 1157)  . remdesivir 100 mg in NS 100 mL Stopped (04/06/20 1040)   acetaminophen **OR** acetaminophen, guaiFENesin-dextromethorphan, ondansetron **OR** ondansetron (ZOFRAN) IV

## 2020-04-07 NOTE — Progress Notes (Signed)
PROGRESS NOTE  Ann [name]  GGE:[id number] DOB: 07-07-1950 DOA: 04/03/2020 PCP: [name], MD  Outpatient Specialists: Dr. Hollie Salk, nephrology Brief Narrative: Ann [name] is a 69 y.o. female with with a history of stage IV CKD with associated anemia, CAD, T2DM, HTN, HLD, GERD, asthma, OSA on CPAP, chronic lumbar back pain who presented with nausea, diarrhea, fever, chills, cough, and severe fatigue worsening since exposure to family member recently discharged for covid-19 pneumonia. In the ED 12/11, she was felt to be dehydrated with Cr up to 5.23 from baseline in 2's, bicarbonate 21, BUN 60. AST 86. CRP 16.1, PCT negative. SARS-CoV-2 PCR confirmed positivity and CXR demonstrated mild airspace opacity at the right base. She was given IV fluids, antibiotics which were later stopped, and remdesivir for covid-19 pneumonia and admitted for AKI with nephrology consulting.  Subjective:  Denies any nausea, vomiting, no fever, no shortness of breath or cough.  Assessment & Plan: Principal Problem:   2019 novel coronavirus disease (COVID-19) Active Problems:   Hyperlipidemia   HYPERTENSION   CAD (coronary artery disease)   CKD (chronic kidney disease), stage IV (HCC)   Uncontrolled type II diabetes mellitus with hyperglycemia (HCC)   Obesity, Class III, BMI 40-49.9 (morbid obesity) (HCC)   GERD (gastroesophageal reflux disease)   AV node arrhythmia   AKI (acute kidney injury) ([facility])   Acute renal failure (HCC)  AKI on stage IV CKD:  - Follows with Dr. Hollie Salk, planning vein mapping and access placement 04/14/2020. -Renal function with significant decline, her baseline creatinine 2.7-3.3, creatinine was 5.3 on admission, remains elevated 6.4 today . -Management per renal, so far no indication for dialysis, continue with IV fluids . -felt to be secondary to GI losses plus diuretics plus ACEi, her  urinalysis with proteinuria .  Covid-19 pneumonia: SARS-CoV-2 PCR positive on 12/11.  Initially put on abx, though discontinued with positive covid swab and negative PCT. -Continue with remdesivir -Hypoxia, so no indication for steroids at this point . - Continue airborne, contact precautions for 21 days from positive testing. - Intermediate dose heparin ordered given renal failure and severity of inflammation/suspected hypercoagulability   UTI: +symptoms and pyuria.  - CTX x3 days  T2DM: HbA1c 6.6% -GU on the lower side, so we will change Levemir to once daily, will discontinue Tradjenta especially with her renal failure.  CAD: No chest pain. Cardiology consulted for telemetry abnormalities felt to be insignificant.  - Continue ASA, statin, imdur.  HTN: Avoid hypotension. - Holding RAS agent and diuretics - Continue hydralazine, imdur  HLD:  - Continue statin  GERD:  - Continue PPI  Morbid obesity: Estimated body mass index is 39.8 kg/m as calculated from the following:   Height as of this encounter: 5\' 2"  (1.575 m).   Weight as of this encounter: 98.7 kg.  DVT prophylaxis: Heparin 7,500 units q8h Code Status: Full Family Communication: I have discussed at length with the patient, answered all her questions  disposition Plan:  Status is: Inpatient  Remains inpatient appropriate because:Persistent severe electrolyte disturbances, Ongoing diagnostic testing needed not appropriate for outpatient work up and Inpatient level of care appropriate due to severity of illness  Dispo: The patient is from: Home              Anticipated d/c is to: SNF              Anticipated d/c date is: > 3 days, depends on renal recovery  Patient currently is not medically stable to d/c.  Consultants:   Nephrology  Cardiology, Dr. Radford Pax  Procedures:   None  Antimicrobials:  Ceftriaxone, azithromycin 12/11  Remdesivir 12/11 >>     Objective: Vitals:   04/07/20 0300 04/07/20 0323 04/07/20 1218 04/07/20 1329  BP:  (!) 161/69 (!) 157/84 (!) 152/81   Pulse:  93 95 95  Resp:  17 20 14   Temp:  98.6 F (37 C) 99 F (37.2 C) 98.7 F (37.1 C)  TempSrc:   Oral Oral  SpO2: 99% 97% 96% 97%  Weight:      Height:        Intake/Output Summary (Last 24 hours) at 04/07/2020 1434 Last data filed at 04/07/2020 1000 Gross per 24 hour  Intake 1552.1 ml  Output 1300 ml  Net 252.1 ml   Filed Weights   04/03/20 2257 04/05/20 0931 04/06/20 0449  Weight: 95.8 kg 95.7 kg 98.7 kg    Awake Alert, Oriented X 3, No new F.N deficits, Normal affect, she is appropriate, she has bilateral upper extremity shaking, but no asterixis Symmetrical Chest wall movement, Good air movement bilaterally, CTAB RRR,No Gallops,Rubs or new Murmurs, No Parasternal Heave +ve B.Sounds, Abd Soft, No tenderness, No rebound - guarding or rigidity. No Cyanosis, Clubbing or edema, No new Rash or bruise    Data Reviewed: I have personally reviewed following labs and imaging studies  CBC: Recent Labs  Lab 04/03/20 1455 04/04/20 0046 04/05/20 0352 04/06/20 0343  WBC 6.0 4.0 4.7 6.1  NEUTROABS  --  2.3 3.5 4.7  HGB 11.2* 9.8* 9.0* 9.0*  HCT 34.4* 30.5* 28.0* 27.4*  MCV 87.8 88.7 87.5 88.1  PLT 166 142* 166 595   Basic Metabolic Panel: Recent Labs  Lab 04/03/20 1455 04/04/20 0046 04/05/20 0352 04/06/20 0343 04/07/20 0549  NA 140 141 141 141 143  K 4.1 3.6 3.9 3.5 3.6  CL 103 105 106 107 109  CO2 21* 20* 18* 20* 17*  GLUCOSE 187* 123* 109* 89 74  BUN 60* 61* 82* 88* 92*  CREATININE 5.23* 5.38* 6.03* 6.54* 6.46*  CALCIUM 8.8* 8.3* 8.6* 8.3* 7.9*  MG  --  2.2 2.2 2.4  --   PHOS  --  5.7* 6.1* 6.7* 6.4*   GFR: Estimated Creatinine Clearance: 8.9 mL/min (A) (by C-G formula based on SCr of 6.46 mg/dL (H)). Liver Function Tests: Recent Labs  Lab 04/03/20 1455 04/04/20 0046 04/05/20 0352 04/06/20 0343 04/07/20 0549  AST 86* 79* 57* 47*  --   ALT 42 36 30 25  --   ALKPHOS 61 49 45 47  --   BILITOT 0.3 0.6 0.3 0.5  --   PROT 7.7 6.4* 6.1* 6.0*  --    ALBUMIN 3.2* 2.7* 2.7* 2.8* 2.6*   HbA1C: No results for input(s): HGBA1C in the last 72 hours. CBG: Recent Labs  Lab 04/06/20 1949 04/06/20 2307 04/07/20 0320 04/07/20 0748 04/07/20 1135  GLUCAP 93 89 71 71 93   Lipid Profile: No results for input(s): CHOL, HDL, LDLCALC, TRIG, CHOLHDL, LDLDIRECT in the last 72 hours. Thyroid Function Tests: No results for input(s): TSH, T4TOTAL, FREET4, T3FREE, THYROIDAB in the last 72 hours. Anemia Panel: Recent Labs    04/05/20 0352  FERRITIN 1,323*   Urine analysis:    Component Value Date/Time   COLORURINE AMBER (A) 04/03/2020 1455   APPEARANCEUR CLOUDY (A) 04/03/2020 1455   LABSPEC 1.014 04/03/2020 1455   PHURINE 5.0 04/03/2020 1455   GLUCOSEU NEGATIVE  04/03/2020 1455   HGBUR SMALL (A) 04/03/2020 1455   BILIRUBINUR NEGATIVE 04/03/2020 1455   KETONESUR NEGATIVE 04/03/2020 1455   PROTEINUR >=300 (A) 04/03/2020 1455   UROBILINOGEN 0.2 11/21/2011 1213   NITRITE NEGATIVE 04/03/2020 1455   LEUKOCYTESUR NEGATIVE 04/03/2020 1455   Recent Results (from the past 240 hour(s))  Resp Panel by RT-PCR (Flu A&B, Covid) Nasopharyngeal Swab     Status: Abnormal   Collection Time: 04/03/20  2:56 PM   Specimen: Nasopharyngeal Swab; Nasopharyngeal(NP) swabs in vial transport medium  Result Value Ref Range Status   SARS Coronavirus 2 by RT PCR POSITIVE (A) NEGATIVE Final    Comment: RESULT CALLED TO, READ BACK BY AND VERIFIED WITH: DR RAY @ 2005 04/03/20 [facility] (NOTE) SARS-CoV-2 target nucleic acids are DETECTED.  The SARS-CoV-2 RNA is generally detectable in upper respiratory specimens during the acute phase of infection. Positive results are indicative of the presence of the identified virus, but do not rule out bacterial infection or co-infection with other pathogens not detected by the test. Clinical correlation with patient history and other diagnostic information is necessary to determine patient infection status. The expected result is  Negative.  Fact Sheet for Patients: EntrepreneurPulse.com.au  Fact Sheet for Healthcare Providers: IncredibleEmployment.be  This test is not yet approved or cleared by the [place] FDA and  has been authorized for detection and/or diagnosis of SARS-CoV-2 by FDA under an Emergency Use Authorization (EUA).  This EUA will remain in effect (meaning this test can be used)  for the duration of  the COVID-19 declaration under Section 564(b)(1) of the Act, 21 U.S.C. section 360bbb-3(b)(1), unless the authorization is terminated or revoked sooner.     Influenza A by PCR NEGATIVE NEGATIVE Final   Influenza B by PCR NEGATIVE NEGATIVE Final    Comment: (NOTE) The Xpert Xpress SARS-CoV-2/FLU/RSV plus assay is intended as an aid in the diagnosis of influenza from Nasopharyngeal swab specimens and should not be used as a sole basis for treatment. Nasal washings and aspirates are unacceptable for Xpert Xpress SARS-CoV-2/FLU/RSV testing.  Fact Sheet for Patients: EntrepreneurPulse.com.au  Fact Sheet for Healthcare Providers: IncredibleEmployment.be  This test is not yet approved or cleared by the [place] FDA and has been authorized for detection and/or diagnosis of SARS-CoV-2 by FDA under an Emergency Use Authorization (EUA). This EUA will remain in effect (meaning this test can be used) for the duration of the COVID-19 declaration under Section 564(b)(1) of the Act, 21 U.S.C. section 360bbb-3(b)(1), unless the authorization is terminated or revoked.  Performed at [facility], [place] [address], [place], [place] [address]   Blood culture (routine x 2)     Status: None (Preliminary result)   Collection Time: 04/03/20  3:00 PM   Specimen: BLOOD  Result Value Ref Range Status   Specimen Description   Final    BLOOD RIGHT ANTECUBITAL Performed at [facility]  [address], [place], [place] [address]    Special Requests   Final    BOTTLES DRAWN AEROBIC AND ANAEROBIC Blood Culture adequate volume Performed at [facility] [address], [place], [place] [address]    Culture   Final    NO GROWTH 4 DAYS Performed at [facility], [place] [address], [place], [place] [address]    Report Status PENDING  Incomplete  Blood culture (routine x 2)     Status: None (Preliminary result)   Collection Time: 04/03/20  3:41 PM  Specimen: BLOOD  Result Value Ref Range Status   Specimen Description   Final    BLOOD BLOOD LEFT HAND Performed at [facility] [address] [place], [place] [address]    Special Requests   Final    BOTTLES DRAWN AEROBIC AND ANAEROBIC Blood Culture adequate volume Performed at [facility] [address] [place], [place] [address]    Culture   Final    NO GROWTH 4 DAYS Performed at [facility], [place] [address] [place], [place] [address]    Report Status PENDING  Incomplete      Radiology Studies: No results found.  Scheduled Meds: . amLODipine  10 mg Oral Daily  . vitamin C  500 mg Oral Daily  . aspirin EC  81 mg Oral Daily  . DULoxetine  60 mg Oral Daily  . gabapentin  100 mg Oral QHS  . heparin injection (subcutaneous)  7,500 Units Subcutaneous Q8H  . hydrALAZINE  50 mg Oral TID  . insulin aspart  0-20 Units Subcutaneous Q4H  . [START ON 04/08/2020] insulin detemir  10 Units Subcutaneous Daily  . isosorbide mononitrate  30 mg Oral Daily  . nystatin   Topical TID  . pantoprazole  20 mg Oral Daily  . pravastatin  40 mg Oral q1800  . rOPINIRole  0.25 mg Oral QPM  . sodium bicarbonate  650 mg Oral BID  . zinc sulfate  220 mg Oral Daily   Continuous Infusions: . sodium chloride 65 mL/hr at 04/07/20 0616  . cefTRIAXone (ROCEPHIN)  IV 1 g (04/07/20 1209)  . remdesivir 100 mg in NS 100 mL 100 mg (04/07/20 1404)     LOS: 3 days    Phillips Climes, MD [facility] [email or web address] 04/07/2020, 2:34 PM

## 2020-04-07 NOTE — Progress Notes (Signed)
Physical Therapy Treatment Patient Details Name: Ann [name] MRN: [id number] DOB: Jun 26, 1950 Today's Date: 04/07/2020    History of Present Illness 69 year old female admitted with Covid-19. PMH to include: IDDM, HTN, CKD, chronic back and knee pain, OSA on CPAP, CAD    PT Comments    *The patient moves slowly and requires 2 person assist to stand and ambulate x 6' using RW.Marland Kitchen Patient requires frequent cues. Per MSW note, family is  Interested in  Patient DC to home. Patient currently requires assistance for all aspects of care. Continue PT.  Follow Up Recommendations  SNF , HHPT if DC'd to home with 24/7 caregivers.     Equipment Recommendations  Wheelchair cushion (measurements PT);Wheelchair (measurements PT)    Recommendations for Other Services       Precautions / Restrictions Precautions Precautions: Fall Precaution Comments: monitor sats on RA    Mobility  Bed Mobility   Bed Mobility: Supine to Sit     Supine to sit: Mod assist     General bed mobility comments: extra time to initiate moving, frequent cues,  assistance with legs and trunk to sit up.  Transfers Overall transfer level: Needs assistance Equipment used: Rolling walker (2 wheeled) Transfers: Stand Pivot Transfers Sit to Stand: Mod assist;+2 safety/equipment;+2 physical assistance Stand pivot transfers: Mod assist;+2 physical assistance;+2 safety/equipment       General transfer comment: mod A to power up from bed , assist to manage RW for few unsteady sidesteps/pivot over to recliner. Assisted to stand from recliner ' using Rw and 2 for safety.  Ambulation/Gait Ambulation/Gait assistance: Mod assist;+2 physical assistance;+2 safety/equipment Gait Distance (Feet): 6 Feet Assistive device: Rolling walker (2 wheeled) Gait Pattern/deviations: Step-to pattern Gait velocity: decr   General Gait Details: very slow steps ,cues to keep progressing forward. Recliner brought up/   Stairs              Wheelchair Mobility    Modified Rankin (Stroke Patients Only)       Balance Overall balance assessment: Needs assistance Sitting-balance support: Feet supported Sitting balance-Leahy Scale: Fair     Standing balance support: Bilateral upper extremity supported;During functional activity Standing balance-Leahy Scale: Poor Standing balance comment: reliant on RW and support                            Cognition Arousal/Alertness: Awake/alert Behavior During Therapy: WFL for tasks assessed/performed Overall Cognitive Status: No family/caregiver present to determine baseline cognitive functioning Area of Impairment: Awareness;Problem solving                           Awareness: Emergent Problem Solving: Slow processing;Requires verbal cues General Comments: Patient required frequent cues to stay on task of mobilizing.      Exercises      General Comments        Pertinent Vitals/Pain Pain Assessment: No/denies pain    Home Living                      Prior Function            PT Goals (current goals can now be found in the care plan section) Progress towards PT goals: Progressing toward goals    Frequency    Min 2X/week      PT Plan Discharge plan needs to be updated    Co-evaluation PT/OT/SLP Co-Evaluation/Treatment: Yes Reason for  Co-Treatment: To address functional/ADL transfers;For patient/therapist safety PT goals addressed during session: Mobility/safety with mobility        AM-PAC PT "6 Clicks" Mobility   Outcome Measure  Help needed turning from your back to your side while in a flat bed without using bedrails?: A Lot Help needed moving from lying on your back to sitting on the side of a flat bed without using bedrails?: A Lot Help needed moving to and from a bed to a chair (including a wheelchair)?: A Lot Help needed standing up from a chair using your arms (e.g., wheelchair or bedside chair)?: A  Lot Help needed to walk in hospital room?: A Lot Help needed climbing 3-5 steps with a railing? : Total 6 Click Score: 11    End of Session Equipment Utilized During Treatment: Gait belt Activity Tolerance: Patient limited by fatigue Patient left: in chair;with call bell/phone within reach Nurse Communication: Mobility status PT Visit Diagnosis: Unsteadiness on feet (R26.81);Other abnormalities of gait and mobility (R26.89);Muscle weakness (generalized) (M62.81)     Time: 3545-6256 PT Time Calculation (min) (ACUTE ONLY): 23 min  Charges:  $Gait Training: 8-22 mins                     [facility] Pager [phone] Office [phone]    Claretha Cooper 04/07/2020, 10:49 AM

## 2020-04-07 NOTE — Plan of Care (Signed)
  Problem: Respiratory: Goal: Will maintain a patent airway Outcome: Progressing   Problem: Respiratory: Goal: Complications related to the disease process, condition or treatment will be avoided or minimized Outcome: Progressing   Problem: Nutrition: Goal: Adequate nutrition will be maintained Outcome: Progressing   Problem: Pain Managment: Goal: General experience of comfort will improve Outcome: Progressing

## 2020-04-07 NOTE — Progress Notes (Signed)
Occupational Therapy Progress Note  Patient with decreased task initiation requiring moderate verbal cues and encouragement for bed mobility and to progress with functional ambulation. Patient require mod A x2 for stand pivot transfer and seated rest break before ambulating ~77ft from recliner with rolling walker and mod A x1-2 for stability and safety as patient will sit quickly with poor eccentric control onto chair.     04/07/20 1300  OT Visit Information  Last OT Received On 04/07/20  Assistance Needed +2  PT/OT/SLP Co-Evaluation/Treatment Yes  Reason for Co-Treatment For patient/therapist safety;To address functional/ADL transfers  OT goals addressed during session ADL's and self-care  History of Present Illness 69 year old female admitted with Covid-19. PMH to include: IDDM, HTN, CKD, chronic back and knee pain, OSA on CPAP, CAD  Precautions  Precautions Fall  Precaution Comments monitor sats on RA  Pain Assessment  Pain Assessment Faces  Faces Pain Scale 0  Cognition  Arousal/Alertness Awake/alert  Behavior During Therapy WFL for tasks assessed/performed  Overall Cognitive Status No family/caregiver present to determine baseline cognitive functioning  Area of Impairment Safety/judgement;Problem solving  Safety/Judgement Decreased awareness of safety  Problem Solving Decreased initiation;Slow processing;Requires verbal cues  General Comments Patient required frequent cues to stay on task of mobilizing.  ADL  Overall ADL's  Needs assistance/impaired  Eating/Feeding Set up  Eating/Feeding Details (indicate cue type and reason) assist to open containers  Toilet Transfer Moderate assistance;+2 for physical assistance;+2 for safety/equipment;Ambulation;RW;BSC  Toilet Transfer Details (indicate cue type and reason) initial transfer to recliner, decreased activity tolerance with limited eccentric control into chair. after rest break and with encouragement patient ambulate ~46ft with mod  A x1-2 for safety + stability  Bed Mobility  Overal bed mobility Needs Assistance  Bed Mobility Supine to Sit  Supine to sit Mod assist  General bed mobility comments extra time to initiate moving, frequent cues,  assistance with legs and trunk to sit up.  Balance  Overall balance assessment Needs assistance  Sitting-balance support Feet supported  Sitting balance-Leahy Scale Fair  Standing balance support Bilateral upper extremity supported;During functional activity  Standing balance-Leahy Scale Poor  Standing balance comment reliant on RW and support  Transfers  Overall transfer level Needs assistance  Equipment used Rolling walker (2 wheeled)  Transfers Sit to/from Stand  Sit to Stand Mod assist;+2 physical assistance;+2 safety/equipment  General transfer comment please see toilet transfer in ADL section  OT - End of Session  Equipment Utilized During Treatment Gait belt;Rolling walker  Activity Tolerance Patient tolerated treatment well  Patient left in chair;with call bell/phone within reach;with chair alarm set  Nurse Communication Mobility status  OT Assessment/Plan  OT Plan Discharge plan needs to be updated  OT Visit Diagnosis Unsteadiness on feet (R26.81);History of falling (Z91.81);Muscle weakness (generalized) (M62.81)  OT Frequency (ACUTE ONLY) Min 2X/week  Follow Up Recommendations SNF;Other (comment) (vs HH and 24/7 if family declines SNF)  OT Equipment None recommended by OT  AM-PAC OT "6 Clicks" Daily Activity Outcome Measure (Version 2)  Help from another person eating meals? 3  Help from another person taking care of personal grooming? 3  Help from another person toileting, which includes using toliet, bedpan, or urinal? 1  Help from another person bathing (including washing, rinsing, drying)? 2  Help from another person to put on and taking off regular upper body clothing? 3  Help from another person to put on and taking off regular lower body clothing? 2  6  Click Score 14  OT Goal Progression  Progress towards OT goals Progressing toward goals  Acute Rehab OT Goals  Patient Stated Goal Get stronger  OT Goal Formulation With patient  Time For Goal Achievement 04/19/20  Potential to Achieve Goals Good  ADL Goals  Pt Will Perform Grooming standing;with supervision (for 1 task)  Pt Will Perform Lower Body Bathing with supervision;with adaptive equipment;sitting/lateral leans;sit to/from stand  Pt Will Perform Lower Body Dressing with adaptive equipment;sitting/lateral leans;sit to/from stand;with supervision;with set-up  Pt Will Transfer to Toilet with supervision;ambulating;bedside commode  Pt Will Perform Toileting - Clothing Manipulation and hygiene with adaptive equipment;with min guard assist;sitting/lateral leans;sit to/from stand  Pt/caregiver will Perform Home Exercise Program Both right and left upper extremity;Increased strength;With Supervision (with VSS)  OT Time Calculation  OT Start Time (ACUTE ONLY) 0907  OT Stop Time (ACUTE ONLY) 0931  OT Time Calculation (min) 24 min  OT General Charges  $OT Visit 1 Visit  OT Treatments  $Self Care/Home Management  8-22 mins   Delbert Phenix OT OT pager: [phone]

## 2020-04-08 LAB — GLUCOSE, CAPILLARY
Glucose-Capillary: 98 mg/dL (ref 70–99)
Glucose-Capillary: 107 mg/dL — ABNORMAL HIGH (ref 70–99)
Glucose-Capillary: 120 mg/dL — ABNORMAL HIGH (ref 70–99)
Glucose-Capillary: 128 mg/dL — ABNORMAL HIGH (ref 70–99)

## 2020-04-08 LAB — RENAL FUNCTION PANEL
Albumin: 2.5 g/dL — ABNORMAL LOW (ref 3.5–5.0)
Anion gap: 14 (ref 5–15)
BUN: 86 mg/dL — ABNORMAL HIGH (ref 8–23)
CO2: 16 mmol/L — ABNORMAL LOW (ref 22–32)
Calcium: 7.7 mg/dL — ABNORMAL LOW (ref 8.9–10.3)
Chloride: 111 mmol/L (ref 98–111)
Creatinine, Ser: 6.2 mg/dL — ABNORMAL HIGH (ref 0.44–1.00)
GFR, Estimated: 7 mL/min — ABNORMAL LOW (ref >60)
Glucose, Bld: 88 mg/dL (ref 70–99)
Phosphorus: 5.3 mg/dL — ABNORMAL HIGH (ref 2.5–4.6)
Potassium: 3.6 mmol/L (ref 3.5–5.1)
Sodium: 141 mmol/L (ref 135–145)

## 2020-04-08 LAB — CBC
HCT: 28.5 % — ABNORMAL LOW (ref 36.0–46.0)
Hemoglobin: 8.8 g/dL — ABNORMAL LOW (ref 12.0–15.0)
MCH: 28.1 pg (ref 26.0–34.0)
MCHC: 30.9 g/dL (ref 30.0–36.0)
MCV: 91.1 fL (ref 80.0–100.0)
Platelets: 198 10*3/uL (ref 150–400)
RBC: 3.13 MIL/uL — ABNORMAL LOW (ref 3.87–5.11)
RDW: 15.1 % (ref 11.5–15.5)
WBC: 7 10*3/uL (ref 4.0–10.5)
nRBC: 0 % (ref 0.0–0.2)

## 2020-04-08 LAB — D-DIMER, QUANTITATIVE: D-Dimer, Quant: 1.86 ug/mL-FEU — ABNORMAL HIGH (ref 0.00–0.50)

## 2020-04-08 LAB — C-REACTIVE PROTEIN: CRP: 14.4 mg/dL — ABNORMAL HIGH (ref <1.0)

## 2020-04-08 NOTE — Clinical Social Work Note (Cosign Needed)
    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  A cane, crutch, or walker will not resolve issue with performing activities of daily living. A wheelchair will allow patient to safely perform daily activities. Patient can safely propel the wheelchair in the home or has a caregiver who can provide assistance. Length of need 6 months . Accessories: elevating leg rests (ELRs), wheel locks, extensions and anti-tippers.   04/08/20 1402   04/08/20 1402  For home use only DME Bedside commode  Once       Question:  Patient needs a bedside commode to treat with the following condition  Answer:  COVID   04/08/20 1402

## 2020-04-08 NOTE — Progress Notes (Signed)
[facility] Progress Note  Subjective:  Pt seen in room, in good spirits, creat down 6.2 and UOP 1.6 L  Vitals:   04/07/20 1329 04/07/20 2029 04/08/20 0422 04/08/20 0430  BP: (!) 152/81 (!) 158/77 (!) 151/99   Pulse: 95 96 (!) 103   Resp: 14 18 18    Temp: 98.7 F (37.1 C) 98.4 F (36.9 C) 98.7 F (37.1 C)   TempSrc: Oral     SpO2: 97% 98% 98%   Weight:    98.5 kg  Height:        Exam:   alert, nad   no jvd  Chest cta bilat  Cor reg no RG  Abd soft ntnd no ascites   Ext no LE edema   Alert, NF, ox3       VSS on RA 97%  128/69  HR 80 RR 16    Renal US - Atrophic, echogenic kidneys compatible with chronic medical renal disease. No hydro.   Assessment/ Plan: 1. AKI on CKD IV - b/l creat 2.7- 3.3. AKI due to GI losses+diuretics+ACEi. Urinalysis with proteinuria, pyuria and bacteriuria. Creat 5.3 on admit > 6.03 and started IVF's > creat 6.5 pm 12/14, > 6.4 yest > and 6.2 today. OK for dc from renal standpoint. We have rescheduled her f/u office visit for 04/30/20 at 11:15 am (see dc section also). Will sign off.  2. COVID-19 infection - receiving IV abx for CAP, remdesivir.  3. HTN - on norvasc, hydralazine , imdur. BP's up a bit, continue.      Rob Kenli Waldo 04/08/2020, 4:07 PM   Recent Labs  Lab 04/06/20 0343 04/07/20 0549 04/08/20 0403  K 3.5 3.6 3.6  BUN 88* 92* 86*  CREATININE 6.54* 6.46* 6.20*  CALCIUM 8.3* 7.9* 7.7*  PHOS 6.7* 6.4* 5.3*  HGB 9.0*  --  8.8*   Inpatient medications: . amLODipine  10 mg Oral Daily  . vitamin C  500 mg Oral Daily  . aspirin EC  81 mg Oral Daily  . DULoxetine  60 mg Oral Daily  . gabapentin  100 mg Oral QHS  . heparin injection (subcutaneous)  7,500 Units Subcutaneous Q8H  . hydrALAZINE  50 mg Oral TID  . insulin aspart  0-5 Units Subcutaneous QHS  . insulin aspart  0-9 Units Subcutaneous TID WC  . insulin detemir  10 Units Subcutaneous Daily  . isosorbide mononitrate  30 mg Oral Daily  . nystatin    Topical TID  . pantoprazole  20 mg Oral Daily  . pravastatin  40 mg Oral q1800  . rOPINIRole  0.25 mg Oral QPM  . sodium bicarbonate  650 mg Oral BID  . zinc sulfate  220 mg Oral Daily   . sodium chloride 65 mL/hr at 04/08/20 1549   acetaminophen **OR** acetaminophen, guaiFENesin-dextromethorphan, ondansetron **OR** ondansetron (ZOFRAN) IV

## 2020-04-08 NOTE — Progress Notes (Addendum)
PROGRESS NOTE  Ann [name]  BVQ:[id number] DOB: [birth year] DOA: 04/03/2020 PCP: [name], MD  Outpatient Specialists: Dr. Hollie Salk, nephrology Brief Narrative: Ann [name] is a 69 y.o. female with with a history of stage IV CKD with associated anemia, CAD, T2DM, HTN, HLD, GERD, asthma, OSA on CPAP, chronic lumbar back pain who presented with nausea, diarrhea, fever, chills, cough, and severe fatigue worsening since exposure to family member recently discharged for covid-19 pneumonia. In the ED 12/11, she was felt to be dehydrated with Cr up to 5.23 from baseline in 2's, bicarbonate 21, BUN 60. AST 86. CRP 16.1, PCT negative. SARS-CoV-2 PCR confirmed positivity and CXR demonstrated mild airspace opacity at the right base. She was given IV fluids, antibiotics which were later stopped, and remdesivir for covid-19 pneumonia and admitted for AKI with nephrology consulting.  Subjective:  Patient denies any nausea, vomiting, no fever, no shortness of breath, I have discussed with her, she is adamant about going home, she did decline facility.   Assessment & Plan: Principal Problem:   2019 novel coronavirus disease (COVID-19) Active Problems:   Hyperlipidemia   HYPERTENSION   CAD (coronary artery disease)   CKD (chronic kidney disease), stage IV (HCC)   Uncontrolled type II diabetes mellitus with hyperglycemia (HCC)   Obesity, Class III, BMI 40-49.9 (morbid obesity) (HCC)   GERD (gastroesophageal reflux disease)   AV node arrhythmia   AKI (acute kidney injury) ([place])   Acute renal failure (HCC)  AKI on stage IV CKD:  - Follows with Dr. Hollie Salk, planning vein mapping and access placement 04/14/2020. -Renal function with significant decline, her baseline creatinine 2.7-3.3, creatinine was 5.3 on admission, did peak at 6.5, appears to be plateauing , was recently 6.2 this morning. -Management per renal, so far no indication for dialysis, continue with IV fluids . -felt to be secondary to GI  losses plus diuretics plus ACEi, her  urinalysis with proteinuria .  Covid-19 pneumonia: SARS-CoV-2 PCR positive on 12/11. Initially put on abx, though discontinued with positive covid swab and negative PCT. -Continue with remdesivir -No Hypoxia, so no indication for steroids at this point . - Continue airborne, contact precautions for 21 days from positive testing. - Intermediate dose heparin ordered given renal failure and severity of inflammation/suspected hypercoagulability   UTI: +symptoms and pyuria.  - CTX x3 days  T2DM: HbA1c 6.6% -GU on the lower side, so we will change Levemir to once daily, will discontinue Tradjenta especially with her renal failure.  CAD: No chest pain. Cardiology consulted for telemetry abnormalities felt to be insignificant.  - Continue ASA, statin, imdur.  HTN: Avoid hypotension. - Holding RAS agent and diuretics - Continue hydralazine, imdur  HLD:  - Continue statin  GERD:  - Continue PPI  Morbid obesity: Estimated body mass index is 39.72 kg/m as calculated from the following:   Height as of this encounter: 5\' 2"  (1.575 m).   Weight as of this encounter: 98.5 kg.  DVT prophylaxis: Heparin 7,500 units q8h Code Status: Full Family Communication: I have discussed at length with the patient,answered her questions. Tried to reach her daughter , left a Advertising account executive. disposition Plan:  Status is: Inpatient  Remains inpatient appropriate because:Persistent severe electrolyte disturbances, Ongoing diagnostic testing needed not appropriate for outpatient work up and Inpatient level of care appropriate due to severity of illness  Dispo: The patient is from: Home              Anticipated d/c is to: SNF she  is declining SNF              Anticipated d/c date is: 2 days, depends on renal recovery              Patient currently is not medically stable to d/c.  Consultants:   Nephrology  Cardiology, Dr. Radford Pax  Procedures:    None  Antimicrobials:  Ceftriaxone, azithromycin 12/11  Remdesivir 12/11 >>     Objective: Vitals:   04/07/20 1329 04/07/20 2029 04/08/20 0422 04/08/20 0430  BP: (!) 152/81 (!) 158/77 (!) 151/99   Pulse: 95 96 (!) 103   Resp: 14 18 18    Temp: 98.7 F (37.1 C) 98.4 F (36.9 C) 98.7 F (37.1 C)   TempSrc: Oral     SpO2: 97% 98% 98%   Weight:    98.5 kg  Height:        Intake/Output Summary (Last 24 hours) at 04/08/2020 1403 Last data filed at 04/08/2020 0600 Gross per 24 hour  Intake 1378.38 ml  Output 950 ml  Net 428.38 ml   Filed Weights   04/05/20 0931 04/06/20 0449 04/08/20 0430  Weight: 95.7 kg 98.7 kg 98.5 kg    Awake Alert, Oriented X 3, No new F.N deficits, Normal affect Symmetrical Chest wall movement, Good air movement bilaterally, CTAB RRR,No Gallops,Rubs or new Murmurs, No Parasternal Heave +ve B.Sounds, Abd Soft, No tenderness, No rebound - guarding or rigidity. No Cyanosis, Clubbing or edema, No new Rash or bruise    Data Reviewed: I have personally reviewed following labs and imaging studies  CBC: Recent Labs  Lab 04/03/20 1455 04/04/20 0046 04/05/20 0352 04/06/20 0343 04/08/20 0403  WBC 6.0 4.0 4.7 6.1 7.0  NEUTROABS  --  2.3 3.5 4.7  --   HGB 11.2* 9.8* 9.0* 9.0* 8.8*  HCT 34.4* 30.5* 28.0* 27.4* 28.5*  MCV 87.8 88.7 87.5 88.1 91.1  PLT 166 142* 166 182 811   Basic Metabolic Panel: Recent Labs  Lab 04/04/20 0046 04/05/20 0352 04/06/20 0343 04/07/20 0549 04/08/20 0403  NA 141 141 141 143 141  K 3.6 3.9 3.5 3.6 3.6  CL 105 106 107 109 111  CO2 20* 18* 20* 17* 16*  GLUCOSE 123* 109* 89 74 88  BUN 61* 82* 88* 92* 86*  CREATININE 5.38* 6.03* 6.54* 6.46* 6.20*  CALCIUM 8.3* 8.6* 8.3* 7.9* 7.7*  MG 2.2 2.2 2.4  --   --   PHOS 5.7* 6.1* 6.7* 6.4* 5.3*   GFR: Estimated Creatinine Clearance: 9.3 mL/min (A) (by C-G formula based on SCr of 6.2 mg/dL (H)). Liver Function Tests: Recent Labs  Lab 04/03/20 1455 04/04/20 0046  04/05/20 0352 04/06/20 0343 04/07/20 0549 04/08/20 0403  AST 86* 79* 57* 47*  --   --   ALT 42 36 30 25  --   --   ALKPHOS 61 49 45 47  --   --   BILITOT 0.3 0.6 0.3 0.5  --   --   PROT 7.7 6.4* 6.1* 6.0*  --   --   ALBUMIN 3.2* 2.7* 2.7* 2.8* 2.6* 2.5*   HbA1C: No results for input(s): HGBA1C in the last 72 hours. CBG: Recent Labs  Lab 04/07/20 1135 04/07/20 1605 04/07/20 2138 04/08/20 0747 04/08/20 1111  GLUCAP 93 103* 74 98 107*   Lipid Profile: No results for input(s): CHOL, HDL, LDLCALC, TRIG, CHOLHDL, LDLDIRECT in the last 72 hours. Thyroid Function Tests: No results for input(s): TSH, T4TOTAL, FREET4, T3FREE, THYROIDAB in the  last 72 hours. Anemia Panel: No results for input(s): VITAMINB12, FOLATE, FERRITIN, TIBC, IRON, RETICCTPCT in the last 72 hours. Urine analysis:    Component Value Date/Time   COLORURINE AMBER (A) 04/03/2020 1455   APPEARANCEUR CLOUDY (A) 04/03/2020 1455   LABSPEC 1.014 04/03/2020 1455   PHURINE 5.0 04/03/2020 1455   GLUCOSEU NEGATIVE 04/03/2020 1455   HGBUR SMALL (A) 04/03/2020 1455   BILIRUBINUR NEGATIVE 04/03/2020 1455   KETONESUR NEGATIVE 04/03/2020 1455   PROTEINUR >=300 (A) 04/03/2020 1455   UROBILINOGEN 0.2 11/21/2011 1213   NITRITE NEGATIVE 04/03/2020 1455   LEUKOCYTESUR NEGATIVE 04/03/2020 1455   Recent Results (from the past 240 hour(s))  Resp Panel by RT-PCR (Flu A&B, Covid) Nasopharyngeal Swab     Status: Abnormal   Collection Time: 04/03/20  2:56 PM   Specimen: Nasopharyngeal Swab; Nasopharyngeal(NP) swabs in vial transport medium  Result Value Ref Range Status   SARS Coronavirus 2 by RT PCR POSITIVE (A) NEGATIVE Final    Comment: RESULT CALLED TO, READ BACK BY AND VERIFIED WITH: DR RAY @ 2005 04/03/20 S[facility] (NOTE) SARS-CoV-2 target nucleic acids are DETECTED.  The SARS-CoV-2 RNA is generally detectable in upper respiratory specimens during the acute phase of infection. Positive results are indicative of the presence  of the identified virus, but do not rule out bacterial infection or co-infection with other pathogens not detected by the test. Clinical correlation with patient history and other diagnostic information is necessary to determine patient infection status. The expected result is Negative.  Fact Sheet for Patients: EntrepreneurPulse.com.au  Fact Sheet for Healthcare Providers: IncredibleEmployment.be  This test is not yet approved or cleared by the [place] FDA and  has been authorized for detection and/or diagnosis of SARS-CoV-2 by FDA under an Emergency Use Authorization (EUA).  This EUA will remain in effect (meaning this test can be used)  for the duration of  the COVID-19 declaration under Section 564(b)(1) of the Act, 21 U.S.C. section 360bbb-3(b)(1), unless the authorization is terminated or revoked sooner.     Influenza A by PCR NEGATIVE NEGATIVE Final   Influenza B by PCR NEGATIVE NEGATIVE Final    Comment: (NOTE) The Xpert Xpress SARS-CoV-2/FLU/RSV plus assay is intended as an aid in the diagnosis of influenza from Nasopharyngeal swab specimens and should not be used as a sole basis for treatment. Nasal washings and aspirates are unacceptable for Xpert Xpress SARS-CoV-2/FLU/RSV testing.  Fact Sheet for Patients: EntrepreneurPulse.com.au  Fact Sheet for Healthcare Providers: IncredibleEmployment.be  This test is not yet approved or cleared by the [place] FDA and has been authorized for detection and/or diagnosis of SARS-CoV-2 by FDA under an Emergency Use Authorization (EUA). This EUA will remain in effect (meaning this test can be used) for the duration of the COVID-19 declaration under Section 564(b)(1) of the Act, 21 U.S.C. section 360bbb-3(b)(1), unless the authorization is terminated or revoked.  Performed at [facility], [place] [address], [place], [place]  [address]   Blood culture (routine x 2)     Status: None (Preliminary result)   Collection Time: 04/03/20  3:00 PM   Specimen: BLOOD  Result Value Ref Range Status   Specimen Description   Final    BLOOD RIGHT ANTECUBITAL Performed at [facility] [address] [place],  [address]    Special Requests   Final    BOTTLES DRAWN AEROBIC AND ANAEROBIC Blood Culture adequate volume Performed at [facility] [address] [place],  [address]  Culture   Final    NO GROWTH 4 DAYS Performed at [facility], [place] [address], [place], [place] [address]    Report Status PENDING  Incomplete  Blood culture (routine x 2)     Status: None (Preliminary result)   Collection Time: 04/03/20  3:41 PM   Specimen: BLOOD  Result Value Ref Range Status   Specimen Description   Final    BLOOD BLOOD LEFT HAND Performed at [facility] [address], [place], [place] [address]    Special Requests   Final    BOTTLES DRAWN AEROBIC AND ANAEROBIC Blood Culture adequate volume Performed at [facility] [address] [place], [place] [address]    Culture   Final    NO GROWTH 4 DAYS Performed at [facility], [place] [address] [place], [place] [address]    Report Status PENDING  Incomplete      Radiology Studies: No results found.  Scheduled Meds: . amLODipine  10 mg Oral Daily  . vitamin C  500 mg Oral Daily  . aspirin EC  81 mg Oral Daily  . DULoxetine  60 mg Oral Daily  . gabapentin  100 mg Oral QHS  . heparin injection (subcutaneous)  7,500 Units Subcutaneous Q8H  . hydrALAZINE  50 mg Oral TID  . insulin aspart  0-5 Units Subcutaneous QHS  . insulin aspart  0-9 Units Subcutaneous TID WC  . insulin detemir  10 Units Subcutaneous Daily  . isosorbide mononitrate  30 mg Oral Daily  . nystatin   Topical TID  . pantoprazole  20 mg Oral Daily  . pravastatin  40 mg Oral q1800  . rOPINIRole   0.25 mg Oral QPM  . sodium bicarbonate  650 mg Oral BID  . zinc sulfate  220 mg Oral Daily   Continuous Infusions: . sodium chloride 65 mL/hr at 04/08/20 0400     LOS: 4 days    Phillips Climes, MD [facility] [email or web address] 04/08/2020, 2:03 PM

## 2020-04-08 NOTE — Care Management Important Message (Signed)
Important Message  Patient Details IM Letter given to the Patient. Name: Ann [name] MRN: [id number] Date of Birth: Sep 14, 1950   Medicare Important Message Given:  Yes     Kerin Salen 04/08/2020, 10:33 AM

## 2020-04-08 NOTE — TOC Progression Note (Addendum)
Transition of Care [facility]) - Progression Note    Patient Details  Name: Ann [name] MRN: [id number] Date of Birth: 1951/01/01  Transition [facility] Care [facility]) CM/SW [place], [place] Phone Number: 04/08/2020, 1:09 PM  Clinical Narrative:  Spoke with patient by phone.  She confirmed the plan to return home at d/c.  States she is in need of resumption of HH services-does not know the name of agency.  Gave me permission to call daughter to find out.  Also stated she is in need of bedside commode, wheelchair. Declined hospital bed. Called daughter, who confirmed that [facility] is the agency with whom they are working. She asked that patient be sent home on PTAR at discharge, and that DME be shipped to the home.  Confirmed that address in chart is correct.  She is also wanting PCS services in the home.  Confirmed that patient has MCD, and then instructed her to call PCP office and ask them to submit required paperwork to [facility] for approval of aide services. TOC will continue to follow during the course of hospitalization.  Addendum:  Patient has not been service by [facility] since January of this year.  They currently do not have the staffing in place to pick her up again. Will find another provider.  Addendum II:  Ann [name] with [facility] agrees to service patient for [facility] services. [facility] agrees to ship ordered DME to home.       Expected Discharge Plan: [place] Barriers to Discharge: Barriers Resolved  Expected Discharge Plan and Services Expected Discharge Plan: [place]   Discharge Planning Services: CM Consult Post Acute Care Choice: [place] arrangements for the past 2 months: Apartment                                       Social Determinants of Health (SDOH) Interventions    Readmission Risk Interventions Readmission Risk Prevention Plan 02/23/2019  Transportation Screening Complete  PCP or Specialist Appt within  5-7 Days Complete  Home Care Screening Not Complete  Home Care Screening Not Completed Comments NA  Medication Review (RN CM) Not Complete  Med Review comments NA  Some recent data might be hidden

## 2020-04-09 DIAGNOSIS — I10 Essential (primary) hypertension: Secondary | ICD-10-CM

## 2020-04-09 LAB — RENAL FUNCTION PANEL
Albumin: 2.5 g/dL — ABNORMAL LOW (ref 3.5–5.0)
Anion gap: 16 — ABNORMAL HIGH (ref 5–15)
BUN: 86 mg/dL — ABNORMAL HIGH (ref 8–23)
CO2: 16 mmol/L — ABNORMAL LOW (ref 22–32)
Calcium: 8 mg/dL — ABNORMAL LOW (ref 8.9–10.3)
Chloride: 110 mmol/L (ref 98–111)
Creatinine, Ser: 6.26 mg/dL — ABNORMAL HIGH (ref 0.44–1.00)
GFR, Estimated: 7 mL/min — ABNORMAL LOW (ref >60)
Glucose, Bld: 121 mg/dL — ABNORMAL HIGH (ref 70–99)
Phosphorus: 5.5 mg/dL — ABNORMAL HIGH (ref 2.5–4.6)
Potassium: 3.9 mmol/L (ref 3.5–5.1)
Sodium: 142 mmol/L (ref 135–145)

## 2020-04-09 LAB — CULTURE, BLOOD (ROUTINE X 2)
Culture: NO GROWTH
Culture: NO GROWTH
Special Requests: ADEQUATE
Special Requests: ADEQUATE

## 2020-04-09 LAB — CBC
HCT: 27.9 % — ABNORMAL LOW (ref 36.0–46.0)
Hemoglobin: 8.8 g/dL — ABNORMAL LOW (ref 12.0–15.0)
MCH: 28.6 pg (ref 26.0–34.0)
MCHC: 31.5 g/dL (ref 30.0–36.0)
MCV: 90.6 fL (ref 80.0–100.0)
Platelets: 220 10*3/uL (ref 150–400)
RBC: 3.08 MIL/uL — ABNORMAL LOW (ref 3.87–5.11)
RDW: 15.5 % (ref 11.5–15.5)
WBC: 7.8 10*3/uL (ref 4.0–10.5)
nRBC: 0 % (ref 0.0–0.2)

## 2020-04-09 LAB — GLUCOSE, CAPILLARY
Glucose-Capillary: 100 mg/dL — ABNORMAL HIGH (ref 70–99)
Glucose-Capillary: 122 mg/dL — ABNORMAL HIGH (ref 70–99)

## 2020-04-09 MED ORDER — METOPROLOL TARTRATE 50 MG PO TABS: 25.0000 mg | ORAL_TABLET | Freq: Two times a day (BID) | ORAL | Status: DC

## 2020-04-09 NOTE — TOC Transition Note (Signed)
Transition of Care [facility]) - CM/SW Discharge Note   Patient Details  Name: Ann [name] MRN: [id number] Date of Birth: 06-28-1950  Transition of Care [facility]) CM/SW Contact:  Trish Mage, LCSW Phone Number: 04/09/2020, 11:44 AM   Clinical Narrative:   Patient to d/c today.  Per daughter request, MD signed attestation of medical need for PCS, which was greatly appreciated.  FAXed to [facility].  PTAR arranged.  No further needs identified. TOC sign off.    Final next level of care: [place] Barriers to Discharge: Barriers Resolved   Patient Goals and CMS Choice     Choice offered to / list presented to : Adult Children  Discharge Placement                       Discharge Plan and Services   Discharge Planning Services: CM Consult Post Acute Care Choice: Home Health          DME Arranged: Bedside commode,Lightweight manual wheelchair with seat cushion DME Agency: [employer] Date DME Agency Contacted: 04/08/20 Time DME Agency Contacted: [phone] Representative spoke with at DME Agency: [place]: RN,PT,OT,Nurse's Aide [place]: [place] Date [place]: 04/08/20 Time [place]: [phone] Representative spoke with at [place]: [place] Determinants of Health ([place]) Interventions     Readmission Risk Interventions Readmission Risk Prevention Plan 02/23/2019  Transportation Screening Complete  PCP or Specialist Appt within 5-7 Days Complete  Home Care Screening Not Complete  Home Care Screening Not Completed Comments NA  Medication Review (RN CM) Not Complete  Med Review comments NA  Some recent data might be hidden

## 2020-04-09 NOTE — Progress Notes (Signed)
Physical Therapy Treatment Patient Details Name: Ann [name] MRN: [id number] DOB: 24-Sep-1950 Today's Date: 04/09/2020    History of Present Illness 69 year old female admitted with Covid-19. PMH to include: IDDM, HTN, CKD, chronic back and knee pain, OSA on CPAP, CAD    PT Comments    Pt reluctant to transfer and sitting EOB with OT on arrival.  Pt encouraged to mobilize to decrease burden of care at home since pt and family plan for pt to return home.  Pt provided with cues for safe technique and requires at least mod assist to transfer at this time.  Continue to recommend SNF upon d/c.   Follow Up Recommendations  SNF     Equipment Recommendations  Wheelchair cushion (measurements PT);Wheelchair (measurements PT)    Recommendations for Other Services       Precautions / Restrictions Precautions Precautions: Fall Precaution Comments: monitor sats on RA Restrictions Weight Bearing Restrictions: No    Mobility  Bed Mobility Overal bed mobility: Needs Assistance Bed Mobility: Supine to Sit     Supine to sit: Mod assist;HOB elevated     General bed mobility comments: sitting EOB by OT on arrival, pt with trunk resting on bed upon entering room  Transfers Overall transfer level: Needs assistance Equipment used: Rolling walker (2 wheeled) Transfers: Sit to/from Stand Sit to Stand: Mod assist;+2 physical assistance;+2 safety/equipment Stand pivot transfers: Mod assist;+2 physical assistance;+2 safety/equipment       General transfer comment: verbal cues for safe technique, cues for full extension upon upright posture and to remain within RW, assist for weakness, Spo2 upper 90s and 100% on room air after transfer  Ambulation/Gait             General Gait Details: pt declined, only agreeable to transfer to recliner with encouragement   Stairs             Wheelchair Mobility    Modified Rankin (Stroke Patients Only)       Balance Overall balance  assessment: Needs assistance Sitting-balance support: Feet supported Sitting balance-Leahy Scale: Poor Sitting balance - Comments: reliant on UE support   Standing balance support: Bilateral upper extremity supported;During functional activity Standing balance-Leahy Scale: Poor Standing balance comment: reliant on RW and support                            Cognition Arousal/Alertness: Awake/alert Behavior During Therapy: WFL for tasks assessed/performed Overall Cognitive Status: No family/caregiver present to determine baseline cognitive functioning Area of Impairment: Safety/judgement;Problem solving                         Safety/Judgement: Decreased awareness of safety;Decreased awareness of deficits   Problem Solving: Slow processing;Decreased initiation;Requires verbal cues General Comments: patient repeatedly laying herself back onto the bed requiring max cues to initiate and motivate to participate with therapy. patient also stating "it's easier at home" when asked how she states "because, they help me" poor insight to her limited activity tolerance      Exercises      General Comments General comments (skin integrity, edema, etc.): patient initially drop to 80s with bed mobility however recovered to 90s quickly and denied shortness of breath on RA      Pertinent Vitals/Pain Pain Assessment: Faces Faces Pain Scale: No hurt Pain Intervention(s): Repositioned    Home Living  Prior Function            PT Goals (current goals can now be found in the care plan section) Acute Rehab PT Goals Patient Stated Goal: go home Progress towards PT goals: Progressing toward goals    Frequency    Min 2X/week      PT Plan Current plan remains appropriate    Co-evaluation PT/OT/SLP Co-Evaluation/Treatment: Yes Reason for Co-Treatment: For patient/therapist safety PT goals addressed during session: Mobility/safety with  mobility OT goals addressed during session: ADL's and self-care      AM-PAC PT "6 Clicks" Mobility   Outcome Measure  Help needed turning from your back to your side while in a flat bed without using bedrails?: A Lot Help needed moving from lying on your back to sitting on the side of a flat bed without using bedrails?: A Lot Help needed moving to and from a bed to a chair (including a wheelchair)?: A Lot Help needed standing up from a chair using your arms (e.g., wheelchair or bedside chair)?: A Lot Help needed to walk in hospital room?: A Lot Help needed climbing 3-5 steps with a railing? : Total 6 Click Score: 11    End of Session Equipment Utilized During Treatment: Gait belt Activity Tolerance: Patient limited by fatigue Patient left: in chair;with call bell/phone within reach;with chair alarm set   PT Visit Diagnosis: Unsteadiness on feet (R26.81);Other abnormalities of gait and mobility (R26.89);Muscle weakness (generalized) (M62.81)     Time: 4825-0037 PT Time Calculation (min) (ACUTE ONLY): 28 min  Charges:  $Therapeutic Activity: 8-22 mins                     Ann [name] PT, DPT Acute Rehabilitation Services Pager: [phone] Office: [phone]  York Ram E 04/09/2020, 1:17 PM

## 2020-04-09 NOTE — Discharge Instructions (Signed)
Person Under Monitoring Name: Ann [name]  Location: [address] [place] [address]   Infection Prevention Recommendations for Individuals Confirmed to have, or Being Evaluated for, 2019 Novel Coronavirus (COVID-19) Infection Who Receive Care at Home  Individuals who are confirmed to have, or are being evaluated for, COVID-19 should follow the prevention steps below until a healthcare provider or local or state health department says they can return to normal activities.  Stay home except to get medical care You should restrict activities outside your home, except for getting medical care. Do not go to work, school, or public areas, and do not use public transportation or taxis.  Call ahead before visiting your doctor Before your medical appointment, call the healthcare provider and tell them that you have, or are being evaluated for, COVID-19 infection. This will help the healthcare provider's office take steps to keep other people from getting infected. Ask your healthcare provider to call the local or state health department.  Monitor your symptoms Seek prompt medical attention if your illness is worsening (e.g., difficulty breathing). Before going to your medical appointment, call the healthcare provider and tell them that you have, or are being evaluated for, COVID-19 infection. Ask your healthcare provider to call the local or state health department.  Wear a facemask You should wear a facemask that covers your nose and mouth when you are in the same room with other people and when you visit a healthcare provider. People who live with or visit you should also wear a facemask while they are in the same room with you.  Separate yourself from other people in your home As much as possible, you should stay in a different room from other people in your home. Also, you should use a separate bathroom, if available.  Avoid sharing household items You should not  share dishes, drinking glasses, cups, eating utensils, towels, bedding, or other items with other people in your home. After using these items, you should wash them thoroughly with soap and water.  Cover your coughs and sneezes Cover your mouth and nose with a tissue when you cough or sneeze, or you can cough or sneeze into your sleeve. Throw used tissues in a lined trash can, and immediately wash your hands with soap and water for at least 20 seconds or use an alcohol-based hand rub.  Wash your [facility] your hands often and thoroughly with soap and water for at least 20 seconds. You can use an alcohol-based hand sanitizer if soap and water are not available and if your hands are not visibly dirty. Avoid touching your eyes, nose, and mouth with unwashed hands.   Prevention Steps for Caregivers and Household Members of Individuals Confirmed to have, or Being Evaluated for, COVID-19 Infection Being Cared for in the Home  If you live with, or provide care at home for, a person confirmed to have, or being evaluated for, COVID-19 infection please follow these guidelines to prevent infection:  Follow healthcare provider's instructions Make sure that you understand and can help the patient follow any healthcare provider instructions for all care.  Provide for the patient's basic needs You should help the patient with basic needs in the home and provide support for getting groceries, prescriptions, and other personal needs.  Monitor the patient's symptoms If they are getting sicker, call his or her medical provider and tell them that the patient has, or is being evaluated for, COVID-19 infection. This will help the healthcare  provider's office take steps to keep other people from getting infected. Ask the healthcare provider to call the local or state health department.  Limit the number of people who have contact with the patient  If possible, have only one caregiver for the  patient.  Other household members should stay in another home or place of residence. If this is not possible, they should stay  in another room, or be separated from the patient as much as possible. Use a separate bathroom, if available.  Restrict visitors who do not have an essential need to be in the home.  Keep older adults, very young children, and other sick people away from the patient Keep older adults, very young children, and those who have compromised immune systems or chronic health conditions away from the patient. This includes people with chronic heart, lung, or kidney conditions, diabetes, and cancer.  Ensure good ventilation Make sure that shared spaces in the home have good air flow, such as from an air conditioner or an opened window, weather permitting.  Wash your hands often  Wash your hands often and thoroughly with soap and water for at least 20 seconds. You can use an alcohol based hand sanitizer if soap and water are not available and if your hands are not visibly dirty.  Avoid touching your eyes, nose, and mouth with unwashed hands.  Use disposable paper towels to dry your hands. If not available, use dedicated cloth towels and replace them when they become wet.  Wear a facemask and gloves  Wear a disposable facemask at all times in the room and gloves when you touch or have contact with the patient's blood, body fluids, and/or secretions or excretions, such as sweat, saliva, sputum, nasal mucus, vomit, urine, or feces.  Ensure the mask fits over your nose and mouth tightly, and do not touch it during use.  Throw out disposable facemasks and gloves after using them. Do not reuse.  Wash your hands immediately after removing your facemask and gloves.  If your personal clothing becomes contaminated, carefully remove clothing and launder. Wash your hands after handling contaminated clothing.  Place all used disposable facemasks, gloves, and other waste in a lined  container before disposing them with other household waste.  Remove gloves and wash your hands immediately after handling these items.  Do not share dishes, glasses, or other household items with the patient  Avoid sharing household items. You should not share dishes, drinking glasses, cups, eating utensils, towels, bedding, or other items with a patient who is confirmed to have, or being evaluated for, COVID-19 infection.  After the person uses these items, you should wash them thoroughly with soap and water.  Wash laundry thoroughly  Immediately remove and wash clothes or bedding that have blood, body fluids, and/or secretions or excretions, such as sweat, saliva, sputum, nasal mucus, vomit, urine, or feces, on them.  Wear gloves when handling laundry from the patient.  Read and follow directions on labels of laundry or clothing items and detergent. In general, wash and dry with the warmest temperatures recommended on the label.  Clean all areas the individual has used often  Clean all touchable surfaces, such as counters, tabletops, doorknobs, bathroom fixtures, toilets, phones, keyboards, tablets, and bedside tables, every day. Also, clean any surfaces that may have blood, body fluids, and/or secretions or excretions on them.  Wear gloves when cleaning surfaces the patient has come in contact with.  Use a diluted bleach solution (e.g., dilute bleach with  1 part bleach and 10 parts water) or a household disinfectant with a label that says EPA-registered for coronaviruses. To make a bleach solution at home, add 1 tablespoon of bleach to 1 quart (4 cups) of water. For a larger supply, add  cup of bleach to 1 gallon (16 cups) of water.  Read labels of cleaning products and follow recommendations provided on product labels. Labels contain instructions for safe and effective use of the cleaning product including precautions you should take when applying the product, such as wearing gloves or  eye protection and making sure you have good ventilation during use of the product.  Remove gloves and wash hands immediately after cleaning.  Monitor yourself for signs and symptoms of illness Caregivers and household members are considered close contacts, should monitor their health, and will be asked to limit movement outside of the home to the extent possible. Follow the monitoring steps for close contacts listed on the symptom monitoring form.   ? If you have additional questions, contact your local health department or call the epidemiologist on call at [phone] (available 24/7). ? This guidance is subject to change. For the most up-to-date guidance from [facility], please refer to their website: [email or web address]

## 2020-04-09 NOTE — Progress Notes (Signed)
Occupational Therapy Treatment Patient Details Name: Ann [name] MRN: [id number] DOB: 01-Jun-1950 Today's Date: 04/09/2020    History of present illness 69 year old female admitted with Covid-19. PMH to include: IDDM, HTN, CKD, chronic back and knee pain, OSA on CPAP, CAD   OT comments  Patient requiring max encouragement and motivation to get OOB this session. Patient frequently laying herself back onto the bed require mod A to complete bed mobility. Patient mod A x2 to power up to standing with increased difficulty taking steps towards recliner requiring max cues to keep LEs inside frame of walker. Patient reports feeling worn out after transfer. Educated patient on importance of daily mobility to maximize activity tolerance, strength if her goal is to go home with family A.    Follow Up Recommendations  SNF;Other (comment) (vs HH with 24/7 supervision)    Equipment Recommendations  None recommended by OT       Precautions / Restrictions Precautions Precautions: Fall Precaution Comments: monitor sats on RA Restrictions Weight Bearing Restrictions: No       Mobility Bed Mobility Overal bed mobility: Needs Assistance Bed Mobility: Supine to Sit     Supine to sit: Mod assist;HOB elevated     General bed mobility comments: extra time to initiate moving, frequent cues,  assistance with trunk to sit up. patient frequently laying herself back down onto the bed "i'm tired"  Transfers Overall transfer level: Needs assistance Equipment used: Rolling walker (2 wheeled) Transfers: Sit to/from Stand Sit to Stand: Mod assist;+2 physical assistance;+2 safety/equipment Stand pivot transfers: Mod assist;+2 physical assistance;+2 safety/equipment       General transfer comment: please see toilet transfer in ADL section    Balance Overall balance assessment: Needs assistance Sitting-balance support: Feet supported Sitting balance-Leahy Scale: Poor Sitting balance - Comments:  reliant on UE support   Standing balance support: Bilateral upper extremity supported;During functional activity Standing balance-Leahy Scale: Poor Standing balance comment: reliant on RW and support                           ADL either performed or assessed with clinical judgement   ADL Overall ADL's : Needs assistance/impaired                         Toilet Transfer: Moderate assistance;+2 for physical assistance;+2 for safety/equipment;Ambulation;RW;BSC Toilet Transfer Details (indicate cue type and reason): transfer to recliner, requires max cues to initiate and mod x2 to power up to standing. patient having difficulty advancing LEs towards chair taking small shuffled steps with poor eccentric control into chair. Toileting- Clothing Manipulation and Hygiene: Total assistance;Sit to/from stand Toileting - Clothing Manipulation Details (indicate cue type and reason): decreased standing tolerance and balance requiring total A for peri care     Functional mobility during ADLs: Moderate assistance;+2 for physical assistance;+2 for safety/equipment;Rolling walker;Cueing for safety;Cueing for sequencing General ADL Comments: patient moves very slowly requiring max encouragement and cues, feels exhausted after transferring to recliner few steps from bed. poor activity tolerance               Cognition Arousal/Alertness: Awake/alert Behavior During Therapy: WFL for tasks assessed/performed Overall Cognitive Status: No family/caregiver present to determine baseline cognitive functioning Area of Impairment: Safety/judgement;Problem solving                         Safety/Judgement: Decreased awareness of safety;Decreased awareness of  deficits   Problem Solving: Slow processing;Decreased initiation;Requires verbal cues General Comments: patient repeatedly laying herself back onto the bed requiring max cues to initiate and motivate to participate with therapy.  patient also stating "it's easier at home" when asked how she states "because, they help me" poor insight to her limited activity tolerance              General Comments patient initially drop to 80s with bed mobility however recovered to 90s quickly and denied shortness of breath on RA    Pertinent Vitals/ Pain       Pain Assessment: Faces Faces Pain Scale: No hurt         Frequency  Min 2X/week        Progress Toward Goals  OT Goals(current goals can now be found in the care plan section)  Progress towards OT goals: Progressing toward goals  Acute Rehab OT Goals Patient Stated Goal: go home OT Goal Formulation: With patient Time For Goal Achievement: 04/19/20 Potential to Achieve Goals: Good ADL Goals Pt Will Perform Grooming: standing;with supervision (for 1 task) Pt Will Perform Lower Body Bathing: with supervision;with adaptive equipment;sitting/lateral leans;sit to/from stand Pt Will Perform Lower Body Dressing: with adaptive equipment;sitting/lateral leans;sit to/from stand;with supervision;with set-up Pt Will Transfer to Toilet: with supervision;ambulating;bedside commode Pt Will Perform Toileting - Clothing Manipulation and hygiene: with adaptive equipment;with min guard assist;sitting/lateral leans;sit to/from stand Pt/caregiver will Perform Home Exercise Program: Both right and left upper extremity;Increased strength;With Supervision (with VSS)  Plan Discharge plan remains appropriate    Co-evaluation    PT/OT/SLP Co-Evaluation/Treatment: Yes Reason for Co-Treatment: For patient/therapist safety;To address functional/ADL transfers   OT goals addressed during session: ADL's and self-care      AM-PAC OT "6 Clicks" Daily Activity     Outcome Measure   Help from another person eating meals?: A Little Help from another person taking care of personal grooming?: A Little Help from another person toileting, which includes using toliet, bedpan, or urinal?:  Total Help from another person bathing (including washing, rinsing, drying)?: A Lot Help from another person to put on and taking off regular upper body clothing?: A Little Help from another person to put on and taking off regular lower body clothing?: A Lot 6 Click Score: 14    End of Session Equipment Utilized During Treatment: Rolling walker  OT Visit Diagnosis: Unsteadiness on feet (R26.81);History of falling (Z91.81);Muscle weakness (generalized) (M62.81)   Activity Tolerance Patient tolerated treatment well   Patient Left in chair;with call bell/phone within reach;with chair alarm set   Nurse Communication Mobility status        Time: 3159-4585 OT Time Calculation (min): 28 min  Charges: OT General Charges $OT Visit: 1 Visit OT Treatments $Self Care/Home Management : 8-22 mins  Delbert Phenix OT OT pager: [phone]   Rosemary Holms 04/09/2020, 12:34 PM

## 2020-04-09 NOTE — Progress Notes (Signed)
Patient is discharging today via [facility] to home with belongings.

## 2020-04-09 NOTE — Discharge Summary (Signed)
Ann [name], is a 69 y.o. female  DOB November 06, 1950  MRN [id number].  Admission date:  04/03/2020  Admitting Physician  Patrecia Pour, MD  Discharge Date:  04/09/2020   Primary MD  [name], MD  Recommendations for primary care physician for things to follow:  -Please check CBC, CMP during next visit. -Patient to follow with renal as an outpatient.  Admission Diagnosis  Weakness [R53.1] AKI (acute kidney injury) ([place]) [N17.9] COVID [U07.1] 2019 novel coronavirus disease (COVID-19) [U07.1] Acute renal failure (HCC) [N17.9]   Discharge Diagnosis  Weakness [R53.1] AKI (acute kidney injury) ([place]) [N17.9] COVID [U07.1] 2019 novel coronavirus disease (COVID-19) [U07.1] Acute renal failure (HCC) [N17.9]    Principal Problem:   2019 novel coronavirus disease (COVID-19) Active Problems:   Hyperlipidemia   HYPERTENSION   CAD (coronary artery disease)   CKD (chronic kidney disease), stage IV (HCC)   Uncontrolled type II diabetes mellitus with hyperglycemia (HCC)   Obesity, Class III, BMI 40-49.9 (morbid obesity) (HCC)   GERD (gastroesophageal reflux disease)   AV node arrhythmia   AKI (acute kidney injury) ([place])   Acute renal failure ([place])      Past Medical History:  Diagnosis Date  . Anemia 09/29/2008   anemia of chronic disease  . Anginal pain ([place])    no recent chest pain  . Arthritis   . Asthma 09/29/2008  . CAD (coronary artery disease) 09/29/2008   hx  PCI - [facility]. Stress test 10/2011 nl with EF 58%. [facility] card., last cath 2010  . Chest pain 02/16/2009  . Chronic kidney disease (CKD), stage III (moderate) ([place]) 10/20/2009  . GERD (gastroesophageal reflux disease)   . Hyperlipidemia 10/21/2009  . Hypertension 09/29/2008  . Insulin dependent diabetes mellitus type IA ([place]) 09/29/2008  . Lumbar back pain 02/16/2009   states pain goes down right leg  . Myalgia  02/16/2009  . Shortness of breath    with exerction  . Shoulder pain, left 03/16/2009   occurs at night  . Sleep apnea    CPAP    Past Surgical History:  Procedure Laterality Date  . ABDOMINAL HYSTERECTOMY    . APPENDECTOMY    . CARDIAC CATHETERIZATION  2008   in [place], [place] stable  . CARDIAC CATHETERIZATION  2010   patent stents in mid and distal RCA; high grade disease in prox & mid AV groove LCX& OM; mod disease in prox & mid LAD normal LV FUNCTION--TREATED MEDICALLY    . CATARACT EXTRACTION, BILATERAL    . CHOLECYSTECTOMY    . CORONARY ANGIOPLASTY WITH STENT PLACEMENT  2005   2 vessel PCI at [facility].  [place]   EF 60%  . TOTAL KNEE ARTHROPLASTY  12/2011   Left  . TOTAL KNEE ARTHROPLASTY  11/28/2011   Procedure: TOTAL KNEE ARTHROPLASTY;  Surgeon: Hessie Dibble, MD;  Location: [facility];  Service: Orthopedics;  Laterality: Right;       History of present illness and  Hospital Course:  Kindly see H&P for history of present illness and admission details, please review complete Labs, Consult reports and Test reports for all details in brief  HPI  from the history and physical done on the day of admission 04/03/2020  HPI: Ann [name] is a 69 y.o. female with medical history significant of anemia of chronic disease, CAD, asthma, stage III CKD, GERD, hyperlipidemia, hypertension, type 2 diabetes, chronic lumbar back pain, myalgia, left shoulder pain, sleep apnea on CPAP who is coming to the emergency department with complaints of onset of multiple episodes of diarrhea associated with nausea, epigastric abdominal pain today (denies emesis, constipation, melena or hematochezia), nonproductive cough, chills, subjective fever, fatigue and generalized sleeping since Monday. No dysuria, frequency or hematuria.  She denies headache, rhinorrhea, chest pain, palpitations, PND, orthopnea or recent pitting edema of the lower extremities.  She has felt lightheaded.   No polyuria, polydipsia, polyphagia or blurred vision.  She was exposed to a family member who has tested positive for COVID-19.   ED Course: Initial vital signs are temperature 98.7 F, pulse 66, respirations 21, BP 142/85 mmHg O2 sat 95% on room air.  The patient received a 500 mL bolus, azithromycin and ceftriaxone in the emergency department.  Labwork: Urinalysis show small hemoglobinuria, proteinuria more than 300 mg/dL in and rare bacteria.  CBC showed a white count of 6.0, hemoglobin 11.2 g/dL platelets 166.  CMP shows CO2 of 21 mmol/L with an anion gap was 16.  All other electrolytes are within normal limits when calcium is corrected to albumin.  Glucose 187, BUN 60 and creatinine 5.23 mg/dL.  LFTs showed the following abnormalities: Total protein 7.7, albumin 3.2 g/dL and AST 86 units/L.  Lactic acid was normal.  Fibrinogen was 627, triglycerides 216 and CRP 16.1 mg/dL.  LDH 346 units/L.  Ferritin was 1000 ADA and procalcitonin was less than 0.10 ng/mL.  Imaging: A 1 view chest radiograph show subtle heterogeneous airspace opacity in the right lung base CT head shows age advanced cerebral atrophy, ventriculomegaly and mild periventricular white matter disease.  There was no acute intracranial findings or mass lesions.  CT abdomen/pelvis did not show any acute abdominal/pelvic abnormality.  Please see images and full radiology report for further detail.    [place] a 69 y.o.femalewith with a history of stage IV CKD with associated anemia, CAD, T2DM, HTN, HLD, GERD, asthma, OSA on CPAP, chronic lumbar back pain who presented with nausea, diarrhea, fever, chills, cough, and severe fatigue worsening since exposure to family member recently discharged for covid-19 pneumonia. In the ED 12/11, she was felt to be dehydrated with Cr up to 5.23 from baseline in 2's, bicarbonate 21, BUN 60. AST 86. CRP 16.1, PCT negative. SARS-CoV-2 PCR confirmed positivity and CXR  demonstrated mild airspace opacity at the right base. She was given IV fluids, antibiotics which were later stopped, and remdesivir for covid-19 pneumonia and admitted for AKI with nephrology consulting.   AKI on stage IV CKD:  - Follows with Dr. Hollie Salk, planning vein mapping and access placement 04/14/2020. -Renal function with significant decline, her baseline creatinine 2.7-3.3, creatinine was 5.3 on admission, did peak at 6.5, appears to be plateauing been stable around 6.2 range for last 3 days, she has been followed closely by renal, at this point she is cleared for discharge, it was felt to be secondary to GI losses plus diuretics plus ACEi, her  urinalysis with proteinuria . -Appointment has been arranged with renal  as an outpatient. -Diuretics and ACEi has been held on discharge.  Covid-19 pneumonia: SARS-CoV-2 PCR positive on 12/11. Initially put on abx, though discontinued with positive covid swab and negative PCT. -She was treated with remdesivir, she did not have hypoxia so she was not treated with steroids, she is on room air on discharge.  UTI:  Treated with Rocephin  T2DM: HbA1c 6.6% -Her CBG were on the lower side, so her home dose long-acting insulin has been lowered to 10 units, her NovoLog and Tradjenta has been stopped .  CAD: No chest pain. Cardiology consulted for telemetry abnormalities felt to be insignificant.  - Continue ASA, statin, imdur.  HTN: Avoid hypotension. - Holding ACEi  and diuretics - Continue hydralazine, imdur  HLD:  - Continue statin  GERD:  - Continue PPI  Morbid obesity: Estimated body mass index is 39.72 kg/m as calculated    Discharge Condition:  -Patient is stable at time of discharge, but she is extremely weak, deconditioned and frail, she was offered SNF, but she declined, she was insisting on going home, this was discussed with her daughter as well, who wants her mother to go home as well.   Follow UP   Follow-up  Information    [facility] Follow up.   Specialty: Home Health Services Why: This is the home health agency that will follow up with you.  [facility] did not have the staffing in place to work with you again Contact information: [address] [place] [address] [phone]        Madelon Lips, MD. Go on 04/30/2020.   Specialty: Nephrology Why: your next kidney appt wtih Dr Hollie Salk is for Jan 7th at 11:15 am  Contact information: [place] [address] [phone]                 Discharge Instructions  and  Discharge Medications     Discharge Instructions    Discharge instructions   Complete by: As directed    Follow with Primary MD [name], MD in 14 days   Get CBC, CMP,  checked  by Primary MD next visit.    Activity: As tolerated with Full fall precautions use walker/cane & assistance as needed   Disposition Home    Diet: Renal/carb modified , with feeding assistance and aspiration precautions.  For Heart failure patients - Check your Weight same time everyday, if you gain over 2 pounds, or you develop in leg swelling, experience more shortness of breath or chest pain, call your Primary MD immediately. Follow Cardiac Low Salt Diet and 1.5 lit/day fluid restriction.   On your next visit with your primary care physician please Get Medicines reviewed and adjusted.   Please request your Prim.MD to go over all Hospital Tests and Procedure/Radiological results at the follow up, please get all Hospital records sent to your Prim MD by signing hospital release before you go home.   If you experience worsening of your admission symptoms, develop shortness of breath, life threatening emergency, suicidal or homicidal thoughts you must seek medical attention immediately by calling 911 or calling your MD immediately  if symptoms less severe.  You Must read complete instructions/literature along with all the possible adverse  reactions/side effects for all the Medicines you take and that have been prescribed to you. Take any new Medicines after you have completely understood and accpet all the possible adverse reactions/side effects.   Do not drive, operating heavy machinery, perform  activities at heights, swimming or participation in water activities or provide baby sitting services if your were admitted for syncope or siezures until you have seen by Primary MD or a Neurologist and advised to do so again.  Do not drive when taking Pain medications.    Do not take more than prescribed Pain, Sleep and Anxiety Medications  Special Instructions: If you have smoked or chewed Tobacco  in the last 2 yrs please stop smoking, stop any regular Alcohol  and or any Recreational drug use.  Wear Seat belts while driving.   Please note  You were cared for by a hospitalist during your hospital stay. If you have any questions about your discharge medications or the care you received while you were in the hospital after you are discharged, you can call the unit and asked to speak with the hospitalist on call if the hospitalist that took care of you is not available. Once you are discharged, your primary care physician will handle any further medical issues. Please note that NO REFILLS for any discharge medications will be authorized once you are discharged, as it is imperative that you return to your primary care physician (or establish a relationship with a primary care physician if you do not have one) for your aftercare needs so that they can reassess your need for medications and monitor your lab values.   Increase activity slowly   Complete by: As directed    No wound care   Complete by: As directed      Allergies as of 04/09/2020      Reactions   Codeine Nausea And Vomiting   Metronidazole Itching      Medication List    STOP taking these medications   furosemide 40 MG tablet Commonly known as: LASIX   HumaLOG  KwikPen 100 UNIT/ML KwikPen Generic drug: insulin lispro   insulin aspart 100 UNIT/ML injection Commonly known as: NovoLOG FlexPen   insulin detemir 100 UNIT/ML injection Commonly known as: LEVEMIR   linagliptin 5 MG Tabs tablet Commonly known as: TRADJENTA   lisinopril-hydrochlorothiazide 20-12.5 MG tablet Commonly known as: ZESTORETIC     TAKE these medications   Accu-Chek FastClix Lancets Misc USE AS DIRECTED TO CHECK BLOOD SUGAR FOUR TIMES A DAY(BEFORE MEALS AND AT BEDTIME). What changed: See the new instructions.   Accu-Chek SmartView test strip Generic drug: glucose blood CHECK BLOOD SUGAR BEFORE MEALS AND AT BEDTIME What changed: See the new instructions.   amLODipine 10 MG tablet Commonly known as: NORVASC TAKE 1 TABLET BY MOUTH DAILY   aspirin EC 81 MG tablet Take 81 mg by mouth daily.   DULoxetine 60 MG capsule Commonly known as: CYMBALTA Take 60 mg by mouth daily.   gabapentin 100 MG capsule Commonly known as: NEURONTIN Take 1 capsule (100 mg total) by mouth at bedtime.   hydrALAZINE 50 MG tablet Commonly known as: APRESOLINE Take 50 mg by mouth 3 (three) times daily.   Insulin Pen Needle 31G X 8 MM Misc Commonly known as: Easy Touch Pen Needles Use to inject insulin as instructed.   isosorbide mononitrate 30 MG 24 hr tablet Commonly known as: IMDUR TAKE 1 TABLET BY MOUTH ONCE DAILY   metoprolol tartrate 50 MG tablet Commonly known as: LOPRESSOR Take 0.5 tablets (25 mg total) by mouth 2 (two) times daily. What changed: how much to take   pantoprazole 20 MG tablet Commonly known as: PROTONIX TAKE 1 TABLET BY MOUTH DAILY   pravastatin 40 MG tablet  Commonly known as: PRAVACHOL TAKE ONE TABLET BY MOUTH EVERY EVENING What changed: when to take this   rOPINIRole 0.25 MG tablet Commonly known as: REQUIP Take 0.25 mg by mouth every evening.   sodium bicarbonate 650 MG tablet Take 650 mg by mouth 2 (two) times daily.   Toujeo SoloStar 300  UNIT/ML Solostar Pen Generic drug: insulin glargine (1 Unit Dial) Inject 10 Units into the skin at bedtime.            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  A cane, crutch, or walker will not resolve issue with performing activities of daily living. A wheelchair will allow patient to safely perform daily activities. Patient can safely propel the wheelchair in the home or has a caregiver who can provide assistance. Length of need 6 months . Accessories: elevating leg rests (ELRs), wheel locks, extensions and anti-tippers.   04/08/20 1402   04/08/20 1402  For home use only DME Bedside commode  Once       Question:  Patient needs a bedside commode to treat with the following condition  Answer:  COVID   04/08/20 1402            Diet and Activity recommendation: See Discharge Instructions above   Consults obtained - renal   Major procedures and Radiology Reports - PLEASE review detailed and final reports for all details, in brief -      CT ABDOMEN PELVIS WO CONTRAST  Result Date: 04/03/2020 CLINICAL DATA:  Abdominal pain. EXAM: CT ABDOMEN AND PELVIS WITHOUT CONTRAST TECHNIQUE: Multidetector CT imaging of the abdomen and pelvis was performed following the standard protocol without IV contrast. COMPARISON:  02/20/2019 FINDINGS: Lower chest: Patchy ground-glass infiltrates in the right lower lobe could be due to developing pneumonia, atypical pneumonia or acute aspiration. No pleural effusions. The heart is normal in size. No pericardial effusion. Three-vessel coronary artery calcifications are noted. Small hiatal hernia. Hepatobiliary: No hepatic lesions are identified  without contrast. No intrahepatic biliary dilatation. The gallbladder is surgically absent. No common bile duct dilatation. Pancreas: Marked fatty atrophy of the pancreas. No mass, inflammation or ductal dilatation. Spleen: Normal size.  No focal lesions. Adrenals/Urinary Tract: The adrenal glands are unremarkable. Small, chronically atrophied kidneys consistent with chronic renal failure. No worrisome renal lesions or hydroureteronephrosis. The bladder is unremarkable. Stomach/Bowel: The stomach, duodenum, small bowel and colon are unremarkable. No acute inflammatory changes, mass lesions or obstructive findings. The terminal ileum is normal. Scattered colonic diverticulosis but no findings for acute diverticulitis. Vascular/Lymphatic: Significant diffuse vascular calcifications. No aneurysm. No mesenteric or retroperitoneal mass or adenopathy. No free air or free fluid. No leaking oral contrast. Reproductive: The uterus is surgically absent. Both ovaries are still present and appear normal. Other: Moderate-sized periumbilical abdominal wall hernia containing fat. Musculoskeletal: No significant bony findings. IMPRESSION: 1. No acute abdominal/pelvic findings, mass lesions or adenopathy. 2. Patchy ground-glass infiltrates in the right lower lobe could be due to developing pneumonia, atypical pneumonia or acute aspiration. 3. Status post cholecystectomy. No biliary dilatation. 4. Small, chronically atrophied kidneys consistent with chronic renal failure. 5. Moderate-sized periumbilical abdominal wall hernia containing  fat. 6. Aortic atherosclerosis. Aortic Atherosclerosis (ICD10-I70.0). Electronically Signed   By: Marijo Sanes M.D.   On: 04/03/2020 19:19   CT Head Wo Contrast  Result Date: 04/03/2020 CLINICAL DATA:  Sudden onset of leg weakness today. EXAM: CT HEAD WITHOUT CONTRAST TECHNIQUE: Contiguous axial images were obtained from the base of the skull through the vertex without intravenous contrast.  COMPARISON:  None. FINDINGS: Brain: Age advanced cerebral atrophy, ventriculomegaly and mild periventricular white matter disease. No extra-axial fluid collections are identified. No CT findings for acute hemispheric infarction or intracranial hemorrhage. No mass lesions. The brainstem and cerebellum are normal. Vascular: Moderate to advanced vascular calcifications but no aneurysm or hyperdense vessels. Skull: No skull fracture or bone lesions. Sinuses/Orbits: The paranasal sinuses and mastoid air cells are clear except for a 2 cm mucous retention cyst or polyp in the left maxillary sinus. The globes are intact. Other: No scalp lesions or scalp hematoma. IMPRESSION: 1. Age advanced cerebral atrophy, ventriculomegaly and mild periventricular white matter disease. 2. No acute intracranial findings or mass lesions. Electronically Signed   By: Marijo Sanes M.D.   On: 04/03/2020 19:14   US RENAL  Result Date: 04/04/2020 CLINICAL DATA:  Acute kidney injury EXAM: RENAL / URINARY TRACT ULTRASOUND COMPLETE COMPARISON:  CT 04/03/2020 FINDINGS: Technical note: Significantly limited examination secondary to poor penetration related to patient body habitus and shadowing from overlying bowel gas. Right Kidney: Limited. Echogenic renal parenchyma with cortical thinning compatible with chronic medical renal disease. Right renal length of approximately 10.6 cm. No shadowing stone or hydronephrosis is identified. Left Kidney: Very limited evaluation. Visualized left kidney appears diffusely echogenic with renal cortical thinning compatible with chronic medical renal disease. No obvious hydronephrosis. Bladder: Appears normal for degree of bladder distention. Other: None. IMPRESSION: 1. Limited exam. 2. No sonographic evidence of obstructive uropathy. 3. Atrophic, echogenic kidneys compatible with chronic medical renal disease. Electronically Signed   By: Davina Poke D.O.   On: 04/04/2020 12:18   DG Chest Port 1  View  Result Date: 04/03/2020 CLINICAL DATA:  Right lower lobe infiltrate EXAM: PORTABLE CHEST 1 VIEW COMPARISON:  Same day CT abdomen pelvis, 04/03/2020, chest radiographs, 04/05/2014 FINDINGS: The heart size and mediastinal contours are within normal limits. Subtle heterogeneous airspace opacity of the right lung base. The visualized skeletal structures are unremarkable. IMPRESSION: Subtle heterogeneous airspace opacity of the right lung base, in keeping with findings of prior CT and consistent with infection or aspiration. Electronically Signed   By: Eddie Candle M.D.   On: 04/03/2020 20:23   ECHOCARDIOGRAM COMPLETE  Result Date: 04/04/2020    ECHOCARDIOGRAM REPORT   Patient Name:   Ann [name] Date of Exam: 04/04/2020 Medical Rec #:  [id number]     Height:       62.0 in Accession #:    [id number]    Weight:       211.2 lb Date of Birth:  [birth year]     BSA:          1.956 m Patient Age:    76 years      BP:           146/78 mmHg Patient Gender: F             HR:           82 bpm. Exam Location:  Inpatient Procedure: 2D Echo, Cardiac Doppler, Color Doppler and Intracardiac            Opacification Agent Indications:  Abnormal ECG 794.31 / R94.31  History:        Patient has prior history of Echocardiogram examinations, most                 recent 11/11/1996. CAD, Signs/Symptoms:Shortness of Breath and                 Chest Pain; Risk Factors:Hypertension, Diabetes and                 Dyslipidemia.  Sonographer:    Bernadene Person RDCS Referring Phys: [id number] [place]  1. Left ventricular ejection fraction, by estimation, is 60 to 65%. The left ventricle has normal function. The left ventricle has no regional wall motion abnormalities. Left ventricular diastolic parameters are consistent with Grade II diastolic dysfunction (pseudonormalization). Elevated left ventricular end-diastolic pressure.  2. Right ventricular systolic function is normal. The right ventricular size is normal.  There is normal pulmonary artery systolic pressure.  3. The mitral valve is degenerative. Trivial mitral valve regurgitation. No evidence of mitral stenosis. Moderate mitral annular calcification.  4. The aortic valve is tricuspid. Aortic valve regurgitation is not visualized. Mild to moderate aortic valve sclerosis/calcification is present, without any evidence of aortic stenosis.  5. The inferior vena cava is normal in size with greater than 50% respiratory variability, suggesting right atrial pressure of 3 mmHg. FINDINGS  Left Ventricle: Left ventricular ejection fraction, by estimation, is 60 to 65%. The left ventricle has normal function. The left ventricle has no regional wall motion abnormalities. Definity contrast agent was given IV to delineate the left ventricular  endocardial borders. The left ventricular internal cavity size was normal in size. There is no left ventricular hypertrophy. Left ventricular diastolic parameters are consistent with Grade II diastolic dysfunction (pseudonormalization). Elevated left ventricular end-diastolic pressure. Right Ventricle: The right ventricular size is normal. No increase in right ventricular wall thickness. Right ventricular systolic function is normal. There is normal pulmonary artery systolic pressure. The tricuspid regurgitant velocity is 2.75 m/s, and  with an assumed right atrial pressure of 3 mmHg, the estimated right ventricular systolic pressure is 23.7 mmHg. Left Atrium: Left atrial size was normal in size. Right Atrium: Right atrial size was normal in size. Pericardium: There is no evidence of pericardial effusion. Mitral Valve: The mitral valve is degenerative in appearance. There is moderate thickening of the mitral valve leaflet(s). There is moderate calcification of the mitral valve leaflet(s). Moderate mitral annular calcification. Trivial mitral valve regurgitation. No evidence of mitral valve stenosis. Tricuspid Valve: The tricuspid valve is normal  in structure. Tricuspid valve regurgitation is mild . No evidence of tricuspid stenosis. Aortic Valve: The aortic valve is tricuspid. Aortic valve regurgitation is not visualized. Mild to moderate aortic valve sclerosis/calcification is present, without any evidence of aortic stenosis. Pulmonic Valve: The pulmonic valve was normal in structure. Pulmonic valve regurgitation is not visualized. No evidence of pulmonic stenosis. Aorta: The aortic root is normal in size and structure. Venous: The inferior vena cava is normal in size with greater than 50% respiratory variability, suggesting right atrial pressure of 3 mmHg. IAS/Shunts: No atrial level shunt detected by color flow Doppler.  LEFT VENTRICLE PLAX 2D LVIDd:         5.80 cm  Diastology LVIDs:         4.10 cm  LV e' medial:    3.81 cm/s LV PW:         0.70 cm  LV E/e' medial:  24.3 LV  IVS:        1.00 cm  LV e' lateral:   4.57 cm/s LVOT diam:     2.00 cm  LV E/e' lateral: 20.2 LV SV:         78 LV SV Index:   40 LVOT Area:     3.14 cm  RIGHT VENTRICLE RV S prime:     13.10 cm/s TAPSE (M-mode): 2.0 cm LEFT ATRIUM             Index       RIGHT ATRIUM           Index LA diam:        3.40 cm 1.74 cm/m  RA Area:     18.10 cm LA Vol (A2C):   56.0 ml 28.62 ml/m RA Volume:   47.40 ml  24.23 ml/m LA Vol (A4C):   51.1 ml 26.12 ml/m LA Biplane Vol: 53.4 ml 27.29 ml/m  AORTIC VALVE LVOT Vmax:   104.00 cm/s LVOT Vmean:  86.300 cm/s LVOT VTI:    0.249 m  AORTA Ao Root diam: 3.30 cm Ao Asc diam:  3.60 cm MITRAL VALVE                TRICUSPID VALVE MV Area (PHT): 2.48 cm     TR Peak grad:   30.2 mmHg MV Decel Time: 306 msec     TR Vmax:        275.00 cm/s MV E velocity: 92.40 cm/s MV A velocity: 138.00 cm/s  SHUNTS MV E/A ratio:  0.67         Systemic VTI:  0.25 m                             Systemic Diam: 2.00 cm Jenkins Rouge MD Electronically signed by Jenkins Rouge MD Signature Date/Time: 04/04/2020/11:23:04 AM    Final     Micro Results     Recent Results (from  the past 240 hour(s))  Resp Panel by RT-PCR (Flu A&B, Covid) Nasopharyngeal Swab     Status: Abnormal   Collection Time: 04/03/20  2:56 PM   Specimen: Nasopharyngeal Swab; Nasopharyngeal(NP) swabs in vial transport medium  Result Value Ref Range Status   SARS Coronavirus 2 by RT PCR POSITIVE (A) NEGATIVE Final    Comment: RESULT CALLED TO, READ BACK BY AND VERIFIED WITH: DR RAY @ 2005 04/03/20 [facility] (NOTE) SARS-CoV-2 target nucleic acids are DETECTED.  The SARS-CoV-2 RNA is generally detectable in upper respiratory specimens during the acute phase of infection. Positive results are indicative of the presence of the identified virus, but do not rule out bacterial infection or co-infection with other pathogens not detected by the test. Clinical correlation with patient history and other diagnostic information is necessary to determine patient infection status. The expected result is Negative.  Fact Sheet for Patients: EntrepreneurPulse.com.au  Fact Sheet for Healthcare Providers: IncredibleEmployment.be  This test is not yet approved or cleared by the [place] FDA and  has been authorized for detection and/or diagnosis of SARS-CoV-2 by FDA under an Emergency Use Authorization (EUA).  This EUA will remain in effect (meaning this test can be used)  for the duration of  the COVID-19 declaration under Section 564(b)(1) of the Act, 21 U.S.C. section 360bbb-3(b)(1), unless the authorization is terminated or revoked sooner.     Influenza A by PCR NEGATIVE NEGATIVE Final   Influenza B by PCR NEGATIVE NEGATIVE Final  Comment: (NOTE) The Xpert Xpress SARS-CoV-2/FLU/RSV plus assay is intended as an aid in the diagnosis of influenza from Nasopharyngeal swab specimens and should not be used as a sole basis for treatment. Nasal washings and aspirates are unacceptable for Xpert Xpress SARS-CoV-2/FLU/RSV testing.  Fact Sheet for  Patients: EntrepreneurPulse.com.au  Fact Sheet for Healthcare Providers: IncredibleEmployment.be  This test is not yet approved or cleared by the [place] FDA and has been authorized for detection and/or diagnosis of SARS-CoV-2 by FDA under an Emergency Use Authorization (EUA). This EUA will remain in effect (meaning this test can be used) for the duration of the COVID-19 declaration under Section 564(b)(1) of the Act, 21 U.S.C. section 360bbb-3(b)(1), unless the authorization is terminated or revoked.  Performed at [facility], [place] [address] [place], [place] [address]   Blood culture (routine x 2)     Status: None   Collection Time: 04/03/20  3:00 PM   Specimen: BLOOD  Result Value Ref Range Status   Specimen Description   Final    BLOOD RIGHT ANTECUBITAL Performed at [facility] [address] [place], [place] [address]    Special Requests   Final    BOTTLES DRAWN AEROBIC AND ANAEROBIC Blood Culture adequate volume Performed at [facility] [address], [place], [place] [address]    Culture   Final    NO GROWTH 6 DAYS Performed at [facility], [place] [address], [place], [place] [address]    Report Status 04/09/2020 FINAL  Final  Blood culture (routine x 2)     Status: None   Collection Time: 04/03/20  3:41 PM   Specimen: BLOOD  Result Value Ref Range Status   Specimen Description   Final    BLOOD BLOOD LEFT HAND Performed at [facility] [address] [place], [place] [address]    Special Requests   Final    BOTTLES DRAWN AEROBIC AND ANAEROBIC Blood Culture adequate volume Performed at [facility] [address], [place], [place] [address]    Culture   Final    NO GROWTH 6 DAYS Performed at [facility], [place] [address] [place], [place] [address]    Report Status 04/09/2020 FINAL  Final       Today    Subjective:   Ann [name] today has no headache,no chest abdominal pain,no new weakness tingling or numbness, feels much better wants to go home today.  She reports good appetite, denies any nausea or vomiting.  Objective:   Blood pressure 138/88, pulse 91, temperature 98.3 F (36.8 C), temperature source Oral, resp. rate 20, height 5\' 2"  (1.575 m), weight 99 kg, SpO2 95 %.   Intake/Output Summary (Last 24 hours) at 04/09/2020 1045 Last data filed at 04/09/2020 0700 Gross per 24 hour  Intake 1777.17 ml  Output 700 ml  Net 1077.17 ml    Exam Awake Alert, Oriented x 3, No new F.N deficits, Normal affect Symmetrical Chest wall movement, Good air movement bilaterally, CTAB RRR,No Gallops,Rubs or new Murmurs, No Parasternal Heave +ve B.Sounds, Abd Soft, Non tender,No rebound -guarding or rigidity. No Cyanosis, Clubbing or edema, No new Rash or bruise  Data Review   CBC w Diff:  Lab Results  Component Value Date   WBC 7.8 04/09/2020   HGB 8.8 (L) 04/09/2020   HCT 27.9 (L) 04/09/2020   HCT 31.2 (L) 02/21/2019   PLT 220 04/09/2020   LYMPHOPCT 13 04/06/2020  MONOPCT 7 04/06/2020   EOSPCT 0 04/06/2020   BASOPCT 0 04/06/2020    CMP:  Lab Results  Component Value Date   NA 142 04/09/2020   K 3.9 04/09/2020   CL 110 04/09/2020   CO2 16 (L) 04/09/2020   BUN 86 (H) 04/09/2020   CREATININE 6.26 (H) 04/09/2020   CREATININE 1.53 (H) 09/28/2011   PROT 6.0 (L) 04/06/2020   ALBUMIN 2.5 (L) 04/09/2020   BILITOT 0.5 04/06/2020   ALKPHOS 47 04/06/2020   AST 47 (H) 04/06/2020   ALT 25 04/06/2020  .   Total Time in preparing paper work, data evaluation and todays exam - 91 minutes  Phillips Climes M.D on 04/09/2020 at 10:45 AM  [facility]   Office  [phone]

## 2020-04-11 ENCOUNTER — Inpatient Hospital Stay (HOSPITAL_COMMUNITY): Payer: Medicare Other

## 2020-04-11 ENCOUNTER — Other Ambulatory Visit: Payer: Self-pay

## 2020-04-11 ENCOUNTER — Emergency Department (HOSPITAL_COMMUNITY): Payer: Medicare Other

## 2020-04-11 ENCOUNTER — Inpatient Hospital Stay (HOSPITAL_COMMUNITY)
Admission: EM | Admit: 2020-04-11 | Discharge: 2020-04-22 | DRG: 177 | Disposition: A | Discharge status: Skilled Nursing Facility | Payer: Medicare Other | Attending: Internal Medicine | Admitting: Internal Medicine

## 2020-04-11 ENCOUNTER — Encounter (HOSPITAL_COMMUNITY): Payer: Self-pay | Admitting: Pharmacy Technician

## 2020-04-11 DIAGNOSIS — N185 Chronic kidney disease, stage 5: Secondary | ICD-10-CM | POA: Diagnosis not present

## 2020-04-11 DIAGNOSIS — D631 Anemia in chronic kidney disease: Secondary | ICD-10-CM | POA: Diagnosis present

## 2020-04-11 DIAGNOSIS — I120 Hypertensive chronic kidney disease with stage 5 chronic kidney disease or end stage renal disease: Secondary | ICD-10-CM | POA: Diagnosis present

## 2020-04-11 DIAGNOSIS — J45909 Unspecified asthma, uncomplicated: Secondary | ICD-10-CM | POA: Diagnosis present

## 2020-04-11 DIAGNOSIS — F17290 Nicotine dependence, other tobacco product, uncomplicated: Secondary | ICD-10-CM | POA: Diagnosis present

## 2020-04-11 DIAGNOSIS — N179 Acute kidney failure, unspecified: Secondary | ICD-10-CM | POA: Diagnosis not present

## 2020-04-11 DIAGNOSIS — E876 Hypokalemia: Secondary | ICD-10-CM | POA: Diagnosis not present

## 2020-04-11 DIAGNOSIS — I10 Essential (primary) hypertension: Secondary | ICD-10-CM | POA: Diagnosis not present

## 2020-04-11 DIAGNOSIS — N186 End stage renal disease: Secondary | ICD-10-CM | POA: Diagnosis present

## 2020-04-11 DIAGNOSIS — E872 Acidosis: Secondary | ICD-10-CM | POA: Diagnosis present

## 2020-04-11 DIAGNOSIS — U071 COVID-19: Principal | ICD-10-CM

## 2020-04-11 DIAGNOSIS — K219 Gastro-esophageal reflux disease without esophagitis: Secondary | ICD-10-CM | POA: Diagnosis not present

## 2020-04-11 DIAGNOSIS — I2510 Atherosclerotic heart disease of native coronary artery without angina pectoris: Secondary | ICD-10-CM

## 2020-04-11 DIAGNOSIS — J9601 Acute respiratory failure with hypoxia: Secondary | ICD-10-CM

## 2020-04-11 DIAGNOSIS — L89156 Pressure-induced deep tissue damage of sacral region: Secondary | ICD-10-CM | POA: Diagnosis present

## 2020-04-11 DIAGNOSIS — I82402 Acute embolism and thrombosis of unspecified deep veins of left lower extremity: Secondary | ICD-10-CM | POA: Diagnosis not present

## 2020-04-11 DIAGNOSIS — Z6841 Body Mass Index (BMI) 40.0 and over, adult: Secondary | ICD-10-CM | POA: Diagnosis not present

## 2020-04-11 DIAGNOSIS — G8929 Other chronic pain: Secondary | ICD-10-CM | POA: Diagnosis present

## 2020-04-11 DIAGNOSIS — Z96651 Presence of right artificial knee joint: Secondary | ICD-10-CM | POA: Diagnosis present

## 2020-04-11 DIAGNOSIS — Z992 Dependence on renal dialysis: Secondary | ICD-10-CM

## 2020-04-11 DIAGNOSIS — J1282 Pneumonia due to coronavirus disease 2019: Secondary | ICD-10-CM | POA: Diagnosis present

## 2020-04-11 DIAGNOSIS — Z7982 Long term (current) use of aspirin: Secondary | ICD-10-CM

## 2020-04-11 DIAGNOSIS — E1122 Type 2 diabetes mellitus with diabetic chronic kidney disease: Secondary | ICD-10-CM | POA: Diagnosis not present

## 2020-04-11 DIAGNOSIS — E1165 Type 2 diabetes mellitus with hyperglycemia: Secondary | ICD-10-CM | POA: Diagnosis not present

## 2020-04-11 DIAGNOSIS — J9600 Acute respiratory failure, unspecified whether with hypoxia or hypercapnia: Secondary | ICD-10-CM

## 2020-04-11 DIAGNOSIS — Z79899 Other long term (current) drug therapy: Secondary | ICD-10-CM

## 2020-04-11 DIAGNOSIS — Z794 Long term (current) use of insulin: Secondary | ICD-10-CM

## 2020-04-11 DIAGNOSIS — Z955 Presence of coronary angioplasty implant and graft: Secondary | ICD-10-CM | POA: Diagnosis not present

## 2020-04-11 DIAGNOSIS — I82451 Acute embolism and thrombosis of right peroneal vein: Secondary | ICD-10-CM | POA: Diagnosis present

## 2020-04-11 DIAGNOSIS — Z833 Family history of diabetes mellitus: Secondary | ICD-10-CM

## 2020-04-11 DIAGNOSIS — Z8249 Family history of ischemic heart disease and other diseases of the circulatory system: Secondary | ICD-10-CM | POA: Diagnosis not present

## 2020-04-11 DIAGNOSIS — G9340 Encephalopathy, unspecified: Secondary | ICD-10-CM

## 2020-04-11 DIAGNOSIS — R7989 Other specified abnormal findings of blood chemistry: Secondary | ICD-10-CM | POA: Diagnosis not present

## 2020-04-11 DIAGNOSIS — Z823 Family history of stroke: Secondary | ICD-10-CM | POA: Diagnosis not present

## 2020-04-11 DIAGNOSIS — G9341 Metabolic encephalopathy: Secondary | ICD-10-CM

## 2020-04-11 DIAGNOSIS — E785 Hyperlipidemia, unspecified: Secondary | ICD-10-CM | POA: Diagnosis present

## 2020-04-11 DIAGNOSIS — E66813 Obesity, class 3: Secondary | ICD-10-CM | POA: Diagnosis present

## 2020-04-11 DIAGNOSIS — N184 Chronic kidney disease, stage 4 (severe): Secondary | ICD-10-CM | POA: Diagnosis not present

## 2020-04-11 DIAGNOSIS — Z96653 Presence of artificial knee joint, bilateral: Secondary | ICD-10-CM | POA: Diagnosis present

## 2020-04-11 DIAGNOSIS — G4733 Obstructive sleep apnea (adult) (pediatric): Secondary | ICD-10-CM | POA: Diagnosis present

## 2020-04-11 LAB — TRIGLYCERIDES: Triglycerides: 134 mg/dL (ref <150)

## 2020-04-11 LAB — LACTIC ACID, PLASMA
Lactic Acid, Venous: 1.7 mmol/L (ref 0.5–1.9)
Lactic Acid, Venous: 1.1 mmol/L (ref 0.5–1.9)

## 2020-04-11 LAB — CBC WITH DIFFERENTIAL/PLATELET
Abs Immature Granulocytes: 0.42 10*3/uL — ABNORMAL HIGH (ref 0.00–0.07)
Basophils Absolute: 0 10*3/uL (ref 0.0–0.1)
Basophils Relative: 0 %
Eosinophils Absolute: 0.1 10*3/uL (ref 0.0–0.5)
Eosinophils Relative: 1 %
HCT: 29.7 % — ABNORMAL LOW (ref 36.0–46.0)
Hemoglobin: 9 g/dL — ABNORMAL LOW (ref 12.0–15.0)
Immature Granulocytes: 4 %
Lymphocytes Relative: 10 %
Lymphs Abs: 1 10*3/uL (ref 0.7–4.0)
MCH: 27.7 pg (ref 26.0–34.0)
MCHC: 30.3 g/dL (ref 30.0–36.0)
MCV: 91.4 fL (ref 80.0–100.0)
Monocytes Absolute: 0.8 10*3/uL (ref 0.1–1.0)
Monocytes Relative: 8 %
Neutro Abs: 8 10*3/uL — ABNORMAL HIGH (ref 1.7–7.7)
Neutrophils Relative %: 77 %
Platelets: 321 10*3/uL (ref 150–400)
RBC: 3.25 MIL/uL — ABNORMAL LOW (ref 3.87–5.11)
RDW: 16.2 % — ABNORMAL HIGH (ref 11.5–15.5)
WBC: 10.3 10*3/uL (ref 4.0–10.5)
nRBC: 0 % (ref 0.0–0.2)

## 2020-04-11 LAB — CBC
HCT: 26.2 % — ABNORMAL LOW (ref 36.0–46.0)
Hemoglobin: 8 g/dL — ABNORMAL LOW (ref 12.0–15.0)
MCH: 28.1 pg (ref 26.0–34.0)
MCHC: 30.5 g/dL (ref 30.0–36.0)
MCV: 91.9 fL (ref 80.0–100.0)
Platelets: 286 10*3/uL (ref 150–400)
RBC: 2.85 MIL/uL — ABNORMAL LOW (ref 3.87–5.11)
RDW: 16.3 % — ABNORMAL HIGH (ref 11.5–15.5)
WBC: 10.7 10*3/uL — ABNORMAL HIGH (ref 4.0–10.5)
nRBC: 0.4 % — ABNORMAL HIGH (ref 0.0–0.2)

## 2020-04-11 LAB — COMPREHENSIVE METABOLIC PANEL
ALT: 34 U/L (ref 0–44)
AST: 52 U/L — ABNORMAL HIGH (ref 15–41)
Albumin: 2.3 g/dL — ABNORMAL LOW (ref 3.5–5.0)
Alkaline Phosphatase: 73 U/L (ref 38–126)
Anion gap: 18 — ABNORMAL HIGH (ref 5–15)
BUN: 103 mg/dL — ABNORMAL HIGH (ref 8–23)
CO2: 14 mmol/L — ABNORMAL LOW (ref 22–32)
Calcium: 8.5 mg/dL — ABNORMAL LOW (ref 8.9–10.3)
Chloride: 109 mmol/L (ref 98–111)
Creatinine, Ser: 8.99 mg/dL — ABNORMAL HIGH (ref 0.44–1.00)
GFR, Estimated: 4 mL/min — ABNORMAL LOW (ref >60)
Glucose, Bld: 203 mg/dL — ABNORMAL HIGH (ref 70–99)
Potassium: 4.6 mmol/L (ref 3.5–5.1)
Sodium: 141 mmol/L (ref 135–145)
Total Bilirubin: 0.9 mg/dL (ref 0.3–1.2)
Total Protein: 5.8 g/dL — ABNORMAL LOW (ref 6.5–8.1)

## 2020-04-11 LAB — CBG MONITORING, ED
Glucose-Capillary: 164 mg/dL — ABNORMAL HIGH (ref 70–99)
Glucose-Capillary: 188 mg/dL — ABNORMAL HIGH (ref 70–99)

## 2020-04-11 LAB — FIBRINOGEN: Fibrinogen: 688 mg/dL — ABNORMAL HIGH (ref 210–475)

## 2020-04-11 LAB — D-DIMER, QUANTITATIVE: D-Dimer, Quant: 6 ug/mL-FEU — ABNORMAL HIGH (ref 0.00–0.50)

## 2020-04-11 LAB — C-REACTIVE PROTEIN: CRP: 20.6 mg/dL — ABNORMAL HIGH (ref <1.0)

## 2020-04-11 LAB — MRSA PCR SCREENING: MRSA by PCR: NEGATIVE

## 2020-04-11 LAB — FERRITIN: Ferritin: 1320 ng/mL — ABNORMAL HIGH (ref 11–307)

## 2020-04-11 LAB — PROCALCITONIN: Procalcitonin: 2.68 ng/mL

## 2020-04-11 LAB — LACTATE DEHYDROGENASE: LDH: 281 U/L — ABNORMAL HIGH (ref 98–192)

## 2020-04-11 MED ORDER — CHLORHEXIDINE GLUCONATE CLOTH 2 % EX PADS
6.0000 | MEDICATED_PAD | Freq: Every day | CUTANEOUS | Status: DC
  Administered 2020-04-13 – 2020-04-19 (×7): 6 via TOPICAL

## 2020-04-11 MED ORDER — HEPARIN SODIUM (PORCINE) 5000 UNIT/ML IJ SOLN
5000.0000 [IU] | Freq: Three times a day (TID) | INTRAMUSCULAR | Status: DC
  Administered 2020-04-11 – 2020-04-12 (×3): 5000 [IU] via SUBCUTANEOUS
  Filled 2020-04-11 (×3): qty 1

## 2020-04-11 MED ORDER — METOPROLOL TARTRATE 25 MG PO TABS
25.0000 mg | ORAL_TABLET | Freq: Two times a day (BID) | ORAL | Status: DC
  Administered 2020-04-11 – 2020-04-21 (×21): 25 mg via ORAL
  Filled 2020-04-11 (×23): qty 1

## 2020-04-11 MED ORDER — SODIUM CHLORIDE 0.9 % IV SOLN
1.0000 g | INTRAVENOUS | Status: DC
  Administered 2020-04-11 – 2020-04-13 (×3): 1 g via INTRAVENOUS
  Filled 2020-04-11 (×3): qty 10

## 2020-04-11 MED ORDER — ONDANSETRON HCL 4 MG PO TABS: 4.0000 mg | ORAL_TABLET | Freq: Four times a day (QID) | ORAL | Status: DC | PRN

## 2020-04-11 MED ORDER — DOXYCYCLINE HYCLATE 100 MG PO TABS
100.0000 mg | ORAL_TABLET | Freq: Two times a day (BID) | ORAL | Status: DC
  Administered 2020-04-11 – 2020-04-13 (×4): 100 mg via ORAL
  Filled 2020-04-11 (×5): qty 1

## 2020-04-11 MED ORDER — ASPIRIN EC 81 MG PO TBEC
81.0000 mg | DELAYED_RELEASE_TABLET | Freq: Every day | ORAL | Status: DC
  Administered 2020-04-11 – 2020-04-13 (×2): 81 mg via ORAL
  Filled 2020-04-11 (×3): qty 1

## 2020-04-11 MED ORDER — ASCORBIC ACID 500 MG PO TABS
500.0000 mg | ORAL_TABLET | Freq: Every day | ORAL | Status: DC
  Administered 2020-04-11 – 2020-04-21 (×10): 500 mg via ORAL
  Filled 2020-04-11 (×11): qty 1

## 2020-04-11 MED ORDER — ROPINIROLE HCL 0.25 MG PO TABS
0.2500 mg | ORAL_TABLET | Freq: Every evening | ORAL | Status: DC
  Administered 2020-04-13 – 2020-04-20 (×8): 0.25 mg via ORAL
  Filled 2020-04-11 (×11): qty 1

## 2020-04-11 MED ORDER — PRAVASTATIN SODIUM 40 MG PO TABS
40.0000 mg | ORAL_TABLET | Freq: Every evening | ORAL | Status: DC
Start: 2020-04-11 — End: 2020-04-22
  Administered 2020-04-11 – 2020-04-20 (×9): 40 mg via ORAL
  Filled 2020-04-11 (×9): qty 1

## 2020-04-11 MED ORDER — SODIUM BICARBONATE 650 MG PO TABS
650.0000 mg | ORAL_TABLET | Freq: Two times a day (BID) | ORAL | Status: DC
  Administered 2020-04-11 – 2020-04-19 (×15): 650 mg via ORAL
  Filled 2020-04-11 (×19): qty 1

## 2020-04-11 MED ORDER — LIDOCAINE HCL (PF) 1 % IJ SOLN
INTRAMUSCULAR | Status: AC
  Filled 2020-04-11: qty 30

## 2020-04-11 MED ORDER — HYDRALAZINE HCL 50 MG PO TABS
50.0000 mg | ORAL_TABLET | Freq: Three times a day (TID) | ORAL | Status: DC
  Administered 2020-04-11 – 2020-04-21 (×29): 50 mg via ORAL
  Filled 2020-04-11 (×2): qty 1
  Filled 2020-04-11: qty 2
  Filled 2020-04-11 (×15): qty 1
  Filled 2020-04-11: qty 2
  Filled 2020-04-11 (×11): qty 1

## 2020-04-11 MED ORDER — SODIUM CHLORIDE 0.9 % IV SOLN
1000.0000 mL | INTRAVENOUS | Status: DC
  Administered 2020-04-11: 1000 mL via INTRAVENOUS

## 2020-04-11 MED ORDER — AMLODIPINE BESYLATE 5 MG PO TABS
5.0000 mg | ORAL_TABLET | Freq: Every day | ORAL | Status: DC
  Administered 2020-04-11: 5 mg via ORAL
  Filled 2020-04-11: qty 1

## 2020-04-11 MED ORDER — ALBUTEROL SULFATE HFA 108 (90 BASE) MCG/ACT IN AERS
1.0000 | INHALATION_SPRAY | RESPIRATORY_TRACT | Status: DC | PRN
  Administered 2020-04-12: 2 via RESPIRATORY_TRACT
  Filled 2020-04-11: qty 6.7

## 2020-04-11 MED ORDER — IPRATROPIUM-ALBUTEROL 20-100 MCG/ACT IN AERS
1.0000 | INHALATION_SPRAY | Freq: Four times a day (QID) | RESPIRATORY_TRACT | Status: DC
  Administered 2020-04-11 – 2020-04-19 (×26): 1 via RESPIRATORY_TRACT
  Filled 2020-04-11 (×2): qty 4

## 2020-04-11 MED ORDER — STERILE WATER FOR INJECTION IV SOLN
INTRAVENOUS | Status: DC
  Filled 2020-04-11: qty 9.62

## 2020-04-11 MED ORDER — ISOSORBIDE MONONITRATE ER 30 MG PO TB24
30.0000 mg | ORAL_TABLET | Freq: Every day | ORAL | Status: DC
  Administered 2020-04-11: 30 mg via ORAL
  Filled 2020-04-11: qty 1

## 2020-04-11 MED ORDER — NYSTATIN 100000 UNIT/GM EX POWD
Freq: Two times a day (BID) | CUTANEOUS | Status: DC
  Administered 2020-04-18: 1 via TOPICAL
  Filled 2020-04-11: qty 15

## 2020-04-11 MED ORDER — INSULIN ASPART 100 UNIT/ML ~~LOC~~ SOLN
0.0000 [IU] | Freq: Three times a day (TID) | SUBCUTANEOUS | Status: DC
  Administered 2020-04-11 – 2020-04-12 (×4): 1 [IU] via SUBCUTANEOUS
  Administered 2020-04-13: 3 [IU] via SUBCUTANEOUS
  Administered 2020-04-13: 2 [IU] via SUBCUTANEOUS
  Administered 2020-04-13 – 2020-04-14 (×2): 3 [IU] via SUBCUTANEOUS
  Administered 2020-04-14: 6 [IU] via SUBCUTANEOUS
  Administered 2020-04-14: 2 [IU] via SUBCUTANEOUS
  Administered 2020-04-15 (×3): 1 [IU] via SUBCUTANEOUS
  Administered 2020-04-16 – 2020-04-17 (×3): 2 [IU] via SUBCUTANEOUS
  Administered 2020-04-17: 3 [IU] via SUBCUTANEOUS
  Administered 2020-04-17: 2 [IU] via SUBCUTANEOUS
  Administered 2020-04-18 (×2): 1 [IU] via SUBCUTANEOUS
  Administered 2020-04-18 – 2020-04-19 (×2): 2 [IU] via SUBCUTANEOUS
  Administered 2020-04-19: 5 [IU] via SUBCUTANEOUS

## 2020-04-11 MED ORDER — PANTOPRAZOLE SODIUM 20 MG PO TBEC
20.0000 mg | DELAYED_RELEASE_TABLET | Freq: Every day | ORAL | Status: DC
  Administered 2020-04-13 – 2020-04-21 (×9): 20 mg via ORAL
  Filled 2020-04-11 (×12): qty 1

## 2020-04-11 MED ORDER — ONDANSETRON HCL 4 MG/2ML IJ SOLN: 4.0000 mg | Freq: Four times a day (QID) | INTRAMUSCULAR | Status: DC | PRN

## 2020-04-11 MED ORDER — BISACODYL 10 MG RE SUPP: 10.0000 mg | Freq: Every day | RECTAL | Status: DC | PRN

## 2020-04-11 MED ORDER — TOCILIZUMAB 400 MG/20ML IV SOLN
400.0000 mg | Freq: Once | INTRAVENOUS | Status: AC
  Administered 2020-04-11: 400 mg via INTRAVENOUS
  Filled 2020-04-11: qty 20

## 2020-04-11 MED ORDER — DEXAMETHASONE SODIUM PHOSPHATE 10 MG/ML IJ SOLN: 10.0000 mg | Freq: Once | INTRAMUSCULAR | Status: DC

## 2020-04-11 MED ORDER — DEXAMETHASONE SODIUM PHOSPHATE 10 MG/ML IJ SOLN
6.0000 mg | INTRAMUSCULAR | Status: DC
  Administered 2020-04-11 – 2020-04-13 (×3): 6 mg via INTRAVENOUS
  Filled 2020-04-11 (×3): qty 1

## 2020-04-11 MED ORDER — GUAIFENESIN-DM 100-10 MG/5ML PO SYRP: 10.0000 mL | ORAL_SOLUTION | ORAL | Status: DC | PRN

## 2020-04-11 NOTE — Progress Notes (Signed)
[facility] Progress Note  Subjective: pt dc'd the other day after hosp stay for COVID infection and AKI on CKD IV. Her b/l creat is around 3.0, on her recent creat peaked at 6.5 and on dc was 6.2.  She returns now 3-4 days after dc w/ AMS, somnolence, creat 8.9 and BUN 103.  Asked to see for renal failure.    Vitals:   04/11/20 1100 04/11/20 1145 04/11/20 1230 04/11/20 1423  BP: 128/78 (!) 150/68 (!) 164/69 (!) 146/67  Pulse: 74 79 79 77  Resp: 15 17 17    Temp:      TempSrc:      SpO2: 97% 97% 97%     Exam:    alert, nad , obese AAF, lethargic, arouses and answers some questions then falls back asleep  no jvd  Chest cta bilat  Cor reg no RG  Abd soft ntnd no ascites   Ext no LE edema   Alert, Ox 2, nonfocal, somnolent     Renal US - Atrophic, echogenic kidneys compatible with chronic medical renal disease. No hydro.   Assessment/ Plan: 1. AKI on CKD IV - b/l creat 2.7- 3.3. Pt admitted earlier this week w/ AKI on CKD IV, given IVF's and creat improved from peak 6.5 > 6.2 at dc. Now returns w/ AMS, creat 8.9, BUN 103, somnolent, partially disoriented and +asterixis on exam. Pt is uremic, needs dialysis. Will consult CCM for temp HD cath. Will plan HD overnight. Have d/w dtr who consents to HD cath and dialysis. Will follow.  2. COVID-19 infection - per primary team  3. HTN/ vol - BP's up this time here, CXR has RUL "infiltrates" which could just as well represent pulm edema. Not having any breathing difficulty, and no gross vol excess on exam.  BP's are up though. Will plan UF 2-2.5 L w/ HD.  4. H/o CAD sp stents    Rob Biannca Scantlin 04/11/2020, 3:02 PM   Recent Labs  Lab 04/08/20 0403 04/09/20 0407 04/11/20 1032  K 3.6 3.9 4.6  BUN 86* 86* 103*  CREATININE 6.20* 6.26* 8.99*  CALCIUM 7.7* 8.0* 8.5*  PHOS 5.3* 5.5*  --   HGB 8.8* 8.8* 9.0*   Inpatient medications: . amLODipine  5 mg Oral Daily  . vitamin C  500 mg Oral Daily  . aspirin EC  81 mg Oral  Daily  . dexamethasone (DECADRON) injection  6 mg Intravenous Q24H  . doxycycline  100 mg Oral Q12H  . heparin  5,000 Units Subcutaneous Q8H  . hydrALAZINE  50 mg Oral TID  . insulin aspart  0-6 Units Subcutaneous TID WC  . Ipratropium-Albuterol  1 puff Inhalation Q6H  . isosorbide mononitrate  30 mg Oral Daily  . metoprolol tartrate  25 mg Oral BID  . nystatin   Topical BID  . pantoprazole  20 mg Oral Daily  . pravastatin  40 mg Oral QPM  . rOPINIRole  0.25 mg Oral QPM  . sodium bicarbonate  650 mg Oral BID   . sodium chloride 1,000 mL (04/11/20 1234)  . cefTRIAXone (ROCEPHIN)  IV 1 g (04/11/20 1425)  . sodium bicarbonate in 1/4 NS 1000 mL infusion    . tocilizumab (ACTEMRA) - non-COVID treatment     albuterol, bisacodyl, guaiFENesin-dextromethorphan, ondansetron **OR** ondansetron (ZOFRAN) IV

## 2020-04-11 NOTE — H&P (Addendum)
History and Physical    DOA: 04/11/2020  PCP: [name], MD  Patient coming from: home  Chief Complaint: worsening dyspnea, AMS  HPI: Ann [name] is a 69 y.o. female with history h/o stage IV CKD with associated anemia, CAD, T2DM, HTN, HLD, GERD, asthma, OSA on CPAP, chronic lumbar back pain who was recently admitted 12/11-12/17/21 to [facility] for COVID-19 related GI/resp symptoms treated with Remdesevir ( steroids not given as no hypoxia throughout the stay) presents to ED today with worsening symptoms. In the ED 12/11, she was felt to be dehydrated in addition to CKD baseline creatinine 2.7-3.3, creatinine was 5.3 on admission,did peak at 6.5,  plateaued around 6.2 range for 3 days prior to discharge. Diuretics and ACEi were held on discharge and nephrology follow up arranged. She was deconditioned at discharge but declined SNF , went home with [facility].  Per daughter, patient has been eating poorly and sleeping most of the time since last hospital discharge.  She also appeared to be having labored breathing and unable to perform any of her ADLs.  Daughter asking if staff here can help her take a bath as she has been unable to get her to the bathroom.  Patient currently oriented to self/person only but not to place or time.  She is unable to provide any details regarding hospital visit but does report productive cough and shortness of breath.  ED findings today: Afebrile, SBP 1 20-160, RR 15-19, hypoxic on arrival requiring NRB M transiently-transitioned to 3 L O2 now.  Lab work shows worsening renal function with BUN 86->103, creatinine 6.26->8.99, bicarb 14, potassium 4.6, albumin 2.6->2.3.  CRP 20.6, procalcitonin 2.68, lactate 1.7.  Ferritin 1320, LDH 281, triglyceride 134, D-dimer 2.6->6.0 WBC 10.3, hemoglobin 9.0, platelet 321.  Chest x-ray shows "new mild bilateral upper lobe airspace disease, suspicious for viral pneumonia."  She received 10 mg IV Decadron x1 and on IV fluids NS at 100 mL/h.   Per EDP, nephrology has been consulted and patient requested to be admitted for further evaluation and management.   Review of Systems: As per HPI otherwise 10 point review of systems negative.    Past Medical History:  Diagnosis Date  . Anemia 09/29/2008   anemia of chronic disease  . Anginal pain ([place])    no recent chest pain  . Arthritis   . Asthma 09/29/2008  . CAD (coronary artery disease) 09/29/2008   hx  PCI - [facility]. Stress test 10/2011 nl with EF 58%. [facility] card., last cath 2010  . Chest pain 02/16/2009  . Chronic kidney disease (CKD), stage III (moderate) ([place]) 10/20/2009  . GERD (gastroesophageal reflux disease)   . Hyperlipidemia 10/21/2009  . Hypertension 09/29/2008  . Insulin dependent diabetes mellitus type IA ([place]) 09/29/2008  . Lumbar back pain 02/16/2009   states pain goes down right leg  . Myalgia 02/16/2009  . Shortness of breath    with exerction  . Shoulder pain, left 03/16/2009   occurs at night  . Sleep apnea    CPAP    Past Surgical History:  Procedure Laterality Date  . ABDOMINAL HYSTERECTOMY    . APPENDECTOMY    . CARDIAC CATHETERIZATION  2008   in [place], [place] stable  . CARDIAC CATHETERIZATION  2010   patent stents in mid and distal RCA; high grade disease in prox & mid AV groove LCX& OM; mod disease in prox & mid LAD normal LV FUNCTION--TREATED MEDICALLY    . CATARACT EXTRACTION, BILATERAL    .  CHOLECYSTECTOMY    . CORONARY ANGIOPLASTY WITH STENT PLACEMENT  2005   2 vessel PCI at [facility].  [place]   EF 60%  . TOTAL KNEE ARTHROPLASTY  12/2011   Left  . TOTAL KNEE ARTHROPLASTY  11/28/2011   Procedure: TOTAL KNEE ARTHROPLASTY;  Surgeon: Hessie Dibble, MD;  Location: [facility];  Service: Orthopedics;  Laterality: Right;    Social history:  reports that she has never smoked. Her smokeless tobacco use includes snuff. She reports that she does not drink alcohol and does not use drugs.   Allergies   Allergen Reactions  . Codeine Nausea And Vomiting  . Metronidazole Itching    Family History  Problem Relation Age of Onset  . Heart attack Father   . Heart disease Sister   . Heart disease Brother   . Diabetes Mother   . Hypertension Mother   . Heart disease Mother   . Heart attack Mother   . Stroke Mother   . Colon cancer Neg Hx       Prior to Admission medications   Medication Sig Start Date End Date Taking? Authorizing Provider  ACCU-CHEK FASTCLIX LANCETS MISC USE AS DIRECTED TO CHECK BLOOD SUGAR FOUR TIMES A DAY(BEFORE MEALS AND AT BEDTIME). Patient taking differently: 1 each by Other route 4 (four) times daily. 10/14/12   Sid Falcon, MD  ACCU-CHEK SMARTVIEW test strip CHECK BLOOD SUGAR BEFORE MEALS AND AT BEDTIME Patient taking differently: 1 each by Other route 4 (four) times daily -  before meals and at bedtime. 10/14/12   Sid Falcon, MD  amLODipine (NORVASC) 10 MG tablet TAKE 1 TABLET BY MOUTH DAILY Patient taking differently: Take 10 mg by mouth daily. 02/01/12   Neta Ehlers, MD  aspirin EC 81 MG tablet Take 81 mg by mouth daily.    [provider]  DULoxetine (CYMBALTA) 60 MG capsule Take 60 mg by mouth daily. 03/22/20   [provider]  gabapentin (NEURONTIN) 100 MG capsule Take 1 capsule (100 mg total) by mouth at bedtime. 02/28/19   Charolotte Capuchin, MD  hydrALAZINE (APRESOLINE) 50 MG tablet Take 50 mg by mouth 3 (three) times daily. 01/14/20   [provider]  Insulin Pen Needle (EASY TOUCH PEN NEEDLES) 31G X 8 MM MISC Use to inject insulin as instructed. 02/02/12   Bartholomew Crews, MD  isosorbide mononitrate (IMDUR) 30 MG 24 hr tablet TAKE 1 TABLET BY MOUTH ONCE DAILY Patient taking differently: Take 30 mg by mouth daily. 11/04/14   Lorretta Harp, MD  metoprolol tartrate (LOPRESSOR) 50 MG tablet Take 0.5 tablets (25 mg total) by mouth 2 (two) times daily. 04/09/20   Elgergawy, Silver Huguenin, MD  pantoprazole (PROTONIX) 20 MG  tablet TAKE 1 TABLET BY MOUTH DAILY Patient taking differently: Take 20 mg by mouth daily. 02/05/12   Dominic Pea, DO  pravastatin (PRAVACHOL) 40 MG tablet TAKE ONE TABLET BY MOUTH EVERY EVENING Patient taking differently: Take 40 mg by mouth at bedtime. 06/28/12   Neta Ehlers, MD  rOPINIRole (REQUIP) 0.25 MG tablet Take 0.25 mg by mouth every evening. 02/27/20   [provider]  sodium bicarbonate 650 MG tablet Take 650 mg by mouth 2 (two) times daily. 03/22/20   [provider]  TOUJEO SOLOSTAR 300 UNIT/ML Solostar Pen Inject 10 Units into the skin at bedtime. 02/27/20   [provider]    Physical Exam: Vitals:   04/11/20 1030 04/11/20 1100  04/11/20 1145 04/11/20 1230  BP: 139/76 128/78 (!) 150/68 (!) 164/69  Pulse: 76 74 79 79  Resp: 16 15 17 17   Temp:      TempSrc:      SpO2: 97% 97% 97% 97%    Constitutional: NAD, calm, comfortable Eyes: PERRL, lids and conjunctivae normal ENMT: Mucous membranes are moist. Posterior pharynx clear of any exudate or lesions.Normal dentition.  Neck: normal, supple, no masses, no thyromegaly Respiratory: Mild upper respiratory congestion, clear to auscultation bilaterally, no wheezing, no crackles. Normal respiratory effort on 3 L O2. No accessory muscle use.  Cardiovascular: Regular rate and rhythm, no murmurs / rubs / gallops. No extremity edema. 2+ pedal pulses. No carotid bruits.  Abdomen: no tenderness, no masses palpated. No hepatosplenomegaly. Bowel sounds positive.  Musculoskeletal: no clubbing / cyanosis. No joint deformity upper and lower extremities. Good ROM, no contractures. Normal muscle tone.  Neurologic: Oriented x1 as described above.  CN 2-12 grossly intact. Sensation intact, DTR normal. Strength 5/5 in all 4.  Psychiatric: Normal judgment and insight. Alert and oriented x 3. Normal mood.  SKIN/catheters: no rashes, lesions, ulcers.  Area of redness under breasts, no induration  Labs on Admission: I  have personally reviewed following labs and imaging studies  CBC: Recent Labs  Lab 04/05/20 0352 04/06/20 0343 04/08/20 0403 04/09/20 0407 04/11/20 1032  WBC 4.7 6.1 7.0 7.8 10.3  NEUTROABS 3.5 4.7  --   --  8.0*  HGB 9.0* 9.0* 8.8* 8.8* 9.0*  HCT 28.0* 27.4* 28.5* 27.9* 29.7*  MCV 87.5 88.1 91.1 90.6 91.4  PLT 166 182 198 220 505   Basic Metabolic Panel: Recent Labs  Lab 04/05/20 0352 04/06/20 0343 04/07/20 0549 04/08/20 0403 04/09/20 0407 04/11/20 1032  NA 141 141 143 141 142 141  K 3.9 3.5 3.6 3.6 3.9 4.6  CL 106 107 109 111 110 109  CO2 18* 20* 17* 16* 16* 14*  GLUCOSE 109* 89 74 88 121* 203*  BUN 82* 88* 92* 86* 86* 103*  CREATININE 6.03* 6.54* 6.46* 6.20* 6.26* 8.99*  CALCIUM 8.6* 8.3* 7.9* 7.7* 8.0* 8.5*  MG 2.2 2.4  --   --   --   --   PHOS 6.1* 6.7* 6.4* 5.3* 5.5*  --    GFR: Estimated Creatinine Clearance: 6.4 mL/min (A) (by C-G formula based on SCr of 8.99 mg/dL (H)). Recent Labs  Lab 04/06/20 0343 04/08/20 0403 04/09/20 0407 04/11/20 1031 04/11/20 1032  PROCALCITON  --   --   --   --  2.68  WBC 6.1 7.0 7.8  --  10.3  LATICACIDVEN  --   --   --  1.7  --    Liver Function Tests: Recent Labs  Lab 04/05/20 0352 04/06/20 0343 04/07/20 0549 04/08/20 0403 04/09/20 0407 04/11/20 1032  AST 57* 47*  --   --   --  52*  ALT 30 25  --   --   --  34  ALKPHOS 45 47  --   --   --  73  BILITOT 0.3 0.5  --   --   --  0.9  PROT 6.1* 6.0*  --   --   --  5.8*  ALBUMIN 2.7* 2.8* 2.6* 2.5* 2.5* 2.3*   No results for input(s): LIPASE, AMYLASE in the last 168 hours. No results for input(s): AMMONIA in the last 168 hours. Coagulation Profile: No results for input(s): INR, PROTIME in the last 168 hours. Cardiac Enzymes: No results  for input(s): CKTOTAL, CKMB, CKMBINDEX, TROPONINI in the last 168 hours. BNP (last 3 results) No results for input(s): PROBNP in the last 8760 hours. HbA1C: No results for input(s): HGBA1C in the last 72 hours. CBG: Recent Labs   Lab 04/08/20 1111 04/08/20 1649 04/08/20 2036 04/09/20 0800 04/09/20 1126  GLUCAP 107* 120* 128* 100* 122*   Lipid Profile: Recent Labs    04/11/20 1032  TRIG 134   Thyroid Function Tests: No results for input(s): TSH, T4TOTAL, FREET4, T3FREE, THYROIDAB in the last 72 hours. Anemia Panel: Recent Labs    04/11/20 1032  FERRITIN 1,320*   Urine analysis:    Component Value Date/Time   COLORURINE AMBER (A) 04/03/2020 1455   APPEARANCEUR CLOUDY (A) 04/03/2020 1455   LABSPEC 1.014 04/03/2020 1455   PHURINE 5.0 04/03/2020 1455   GLUCOSEU NEGATIVE 04/03/2020 1455   HGBUR SMALL (A) 04/03/2020 1455   BILIRUBINUR NEGATIVE 04/03/2020 1455   KETONESUR NEGATIVE 04/03/2020 1455   PROTEINUR >=300 (A) 04/03/2020 1455   UROBILINOGEN 0.2 11/21/2011 1213   NITRITE NEGATIVE 04/03/2020 1455   LEUKOCYTESUR NEGATIVE 04/03/2020 1455    Radiological Exams on Admission: Personally reviewed  DG Chest Port 1 View  Result Date: 04/11/2020 CLINICAL DATA:  Shortness of breath. Cough. Hypoxia. COVID-19 virus infection. EXAM: PORTABLE CHEST 1 VIEW COMPARISON:  04/03/2020 FINDINGS: Heart size remains normal. New mild airspace opacity is seen in the central right upper lobe and peripheral left upper lobe. No evidence of pleural effusion. IMPRESSION: New mild bilateral upper lobe airspace disease, suspicious for viral pneumonia. Electronically Signed   By: Marlaine Hind M.D.   On: 04/11/2020 11:30    EKG: Independently reviewed.  Sinus rhythm with LBBB, QTC 488 ms     Assessment and Plan:   Principal Problem:   Acute respiratory failure with hypoxia (HCC) Active Problems:   Acute encephalopathy   2019 novel coronavirus disease (COVID-19)   AKI (acute kidney injury) ([place])   HYPERTENSION   CAD (coronary artery disease)   Obesity, Class III, BMI 40-49.9 (morbid obesity) (HCC)   Chronic pain   GERD (gastroesophageal reflux disease)    1.  Worsening COVID-19 pneumonia with acute hypoxic  respiratory failure: Supplemental O2 as needed, continue IV Decadron therapy, scheduled and as needed MDI, heparin for DVT prophylaxis given significantly elevated D-dimer (avoiding Lovenox with renal failure).  Patient not requiring high flow O2 or ICU admission as of now, will consult pharmacy for immunomodulators given significantly elevated inflammatory markers.  Empiric antibiotics given elevated procalcitonin/new infiltrates.  Check MRSA screen before considering vancomycin.  2.  Progressive AKI on CKD stage IV: Likely due to poor oral intake and ongoing infection/recent hospitalization.  Does have associated metabolic acidosis but potassium less than 5.  Will initiate bicarb fluids.  Nephrology has been consulted through ED. daughter states that there was some discussion about vein mapping/AV fistula for possible dialysis in the near future.  She is asking if this could be initiated while here.  Will await nephrology evaluation and recommendations.  3.  Acute metabolic encephalopathy: Likely related to significant uremia (greater than 100) in the setting of problem #1 and 2.  Hold Cymbalta, Neurontin.  Delirium precautions.  Treat underlying conditions.  IV hydration as discussed above  4.  Diabetes mellitus type 2: Hold long-acting insulin in the setting of problem #2.  Monitor oral intake and adjust insulin accordingly.  Sliding scale insulin for now.  5.  CAD: Resume home medications, aspirin, beta-blockers and statins.  6.  Hypertension: Resume home medications, will however reduce Norvasc to 5 mg and titrate as needed.  Diuretics/ACE inhibitors held in last admission due to problem #2.  7. Chronic pain: Hold Neurontin/Cymbalta for now until mental status improves.  8.  GERD: Resume PPI unless otherwise advised by nephrology.  9.  Obesity class III BMI 40-49.9: Follow-up PCP, weight modifying recommendations upon discharge  DVT prophylaxis: Heparin  COVID screen: Positive  Code  Status: Full code  .Health care proxy would be daughter [name]  Patient/Family Communication: Discussed with patient and all questions answered to satisfaction.  Consults called: Nephrology contacted by ED Admission status :I certify that at the point of admission it is my clinical judgment that the patient will require inpatient hospital care spanning beyond 2 midnights from the point of admission due to high intensity of service and high frequency of surveillance required.Inpatient status is judged to be reasonable and necessary in order to provide the required intensity of service to ensure the patient's safety. The patient's presenting symptoms, physical exam findings, and initial radiographic and laboratory data in the context of their chronic comorbidities is felt to place them at high risk for further clinical deterioration. The following factors support the patient status of inpatient : Hypoxic respiratory failure and AKI on CKD with electrolyte abnormalities.     Guilford Shi MD [facility] Pager in [place]  If 7PM-7AM, please contact night-coverage [email or web address]   04/11/2020, 2:08 PM

## 2020-04-11 NOTE — Procedures (Signed)
Central Venous Catheter Insertion Procedure Note  Ann [name]  [id number]  13-Jan-1951  Date:04/11/20  Time:4:43 PM   Provider Performing:Kameryn Tisdel   Procedure: Insertion of Non-tunneled Central Venous Catheter(36556)with US guidance (98102)    Indication(s) Hemodialysis  Consent Risks of the procedure as well as the alternatives and risks of each were explained to the patient and/or caregiver.  Consent for the procedure was obtained and is signed in the bedside chart  Anesthesia Topical only with 1% lidocaine   Timeout Verified patient identification, verified procedure, site/side was marked, verified correct patient position, special equipment/implants available, medications/allergies/relevant history reviewed, required imaging and test results available.  Sterile Technique Maximal sterile technique including full sterile barrier drape, hand hygiene, sterile gown, sterile gloves, mask, hair covering, sterile ultrasound probe cover (if used).  Procedure Description Area of catheter insertion was cleaned with chlorhexidine and draped in sterile fashion.   With real-time ultrasound guidance a HD catheter was placed into the right internal jugular vein.  Nonpulsatile blood flow and easy flushing noted in all ports.  The catheter was sutured in place and sterile dressing applied.  Complications/Tolerance None; patient tolerated the procedure well. Chest X-ray is ordered to verify placement for internal jugular or subclavian cannulation.  Chest x-ray is not ordered for femoral cannulation.  EBL Minimal  Specimen(s) None  Chonda Baney MD [facility] Please see [email or web address] for pager details.  04/11/2020, 4:43 PM

## 2020-04-11 NOTE — ED Notes (Signed)
Dr. Jonnie Finner advised RN that patient will go to hemodialysis in the morning .

## 2020-04-11 NOTE — ED Notes (Signed)
ED Provider at bedside. 

## 2020-04-11 NOTE — ED Provider Notes (Signed)
[facility] EMERGENCY DEPARTMENT Provider Note   CSN: [id number] Arrival date & time: 04/11/20  1006     History Chief Complaint  Patient presents with  . Covid Positive    Ann [name] is a 69 y.o. female.  Pt presents to the ED today with confusion and sob.  Pt has Covid and was diagnosed on 12/11.  She said she was vaccinated, but had not had her booster.  She was exposed to a family member who had covid.  She was d/c on 12/17.  It was recommended that pt go to a SNF, but she wanted to go home with her daughter.  Pt has been more confused since being home.  Per EMS, her O2 sat was 70% on RA.  She was put on a NRB and O2 sats went up to 100%.  Pt denies any pain.         Past Medical History:  Diagnosis Date  . Anemia 09/29/2008   anemia of chronic disease  . Anginal pain ([place])    no recent chest pain  . Arthritis   . Asthma 09/29/2008  . CAD (coronary artery disease) 09/29/2008   hx  PCI - [facility]. Stress test 10/2011 nl with EF 58%. [place] [facility] card., last cath 2010  . Chest pain 02/16/2009  . Chronic kidney disease (CKD), stage III (moderate) ([place]) 10/20/2009  . GERD (gastroesophageal reflux disease)   . Hyperlipidemia 10/21/2009  . Hypertension 09/29/2008  . Insulin dependent diabetes mellitus type IA ([place]) 09/29/2008  . Lumbar back pain 02/16/2009   states pain goes down right leg  . Myalgia 02/16/2009  . Shortness of breath    with exerction  . Shoulder pain, left 03/16/2009   occurs at night  . Sleep apnea    CPAP    Patient Active Problem List   Diagnosis Date Noted  . 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 AKI (acute kidney injury) ([place]) 04/04/2020  . Acute renal failure ([place]) 04/04/2020  . 2019 novel coronavirus disease (COVID-19) 04/03/2020  . GERD (gastroesophageal reflux disease)   . Constipation 02/25/2019  . Pyelonephritis 02/20/2019  . Acute encephalopathy 02/20/2019  . Chronic pain 02/20/2019  . Obesity, Class III,  BMI 40-49.9 (morbid obesity) ([place]) 01/28/2013  . Uncontrolled type II diabetes mellitus with hyperglycemia ([place]) 03/25/2012  . Health care maintenance 03/25/2012  . Acute blood loss anemia 12/01/2011    Class: Acute  . 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 Plantar fasciitis of right foot 09/28/2011  . Breast pain 03/08/2011  . 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 Knee pain, right 01/20/2011  . Thoracic spine pain 12/13/2010  . Hyperlipidemia 10/21/2009  . CKD (chronic kidney disease), stage IV ([place]) 10/20/2009  . UNSPECIFIED ANEMIA 09/29/2008  . HYPERTENSION 09/29/2008  . CAD (coronary artery disease) 09/29/2008  . ASTHMA, UNSPECIFIED, UNSPECIFIED STATUS 09/29/2008    Past Surgical History:  Procedure Laterality Date  . ABDOMINAL HYSTERECTOMY    . APPENDECTOMY    . CARDIAC CATHETERIZATION  2008   in [place], [place] stable  . CARDIAC CATHETERIZATION  2010   patent stents in mid and distal RCA; high grade disease in prox & mid AV groove LCX& OM; mod disease in prox & mid LAD normal LV FUNCTION--TREATED MEDICALLY    . CATARACT EXTRACTION, BILATERAL    . CHOLECYSTECTOMY    . CORONARY ANGIOPLASTY WITH STENT PLACEMENT  2005   2 vessel PCI at [facility].  Marland Kitchen DOPPLER ECHOCARDIOGRAPHY  1998   EF 60%  . TOTAL KNEE ARTHROPLASTY  12/2011   Left  . TOTAL KNEE ARTHROPLASTY  11/28/2011   Procedure: TOTAL KNEE ARTHROPLASTY;  Surgeon: Hessie Dibble, MD;  Location: [facility];  Service: Orthopedics;  Laterality: Right;     OB History   No obstetric history on file.     Family History  Problem Relation Age of Onset  . Heart attack Father   . Heart disease Sister   . Heart disease Brother   . Diabetes Mother   . Hypertension Mother   . Heart disease Mother   . Heart attack Mother   . Stroke Mother   . Colon cancer Neg Hx     Social History   Tobacco Use  . Smoking status: Never Smoker  . Smokeless tobacco: Current User    Types: Snuff  . Tobacco comment:  using "all my life"  Vaping Use  . Vaping Use: Never used  Substance Use Topics  . Alcohol use: No    Alcohol/week: 0.0 standard drinks  . Drug use: No    Home Medications Prior to Admission medications   Medication Sig Start Date End Date Taking? Authorizing Provider  ACCU-CHEK FASTCLIX LANCETS MISC USE AS DIRECTED TO CHECK BLOOD SUGAR FOUR TIMES A DAY(BEFORE MEALS AND AT BEDTIME). Patient taking differently: 1 each by Other route 4 (four) times daily. 10/14/12   Sid Falcon, MD  ACCU-CHEK SMARTVIEW test strip CHECK BLOOD SUGAR BEFORE MEALS AND AT BEDTIME Patient taking differently: 1 each by Other route 4 (four) times daily -  before meals and at bedtime. 10/14/12   Sid Falcon, MD  amLODipine (NORVASC) 10 MG tablet TAKE 1 TABLET BY MOUTH DAILY Patient taking differently: Take 10 mg by mouth daily. 02/01/12   Neta Ehlers, MD  aspirin EC 81 MG tablet Take 81 mg by mouth daily.    [provider]  DULoxetine (CYMBALTA) 60 MG capsule Take 60 mg by mouth daily. 03/22/20   [provider]  gabapentin (NEURONTIN) 100 MG capsule Take 1 capsule (100 mg total) by mouth at bedtime. 02/28/19   Charolotte Capuchin, MD  hydrALAZINE (APRESOLINE) 50 MG tablet Take 50 mg by mouth 3 (three) times daily. 01/14/20   [provider]  Insulin Pen Needle (EASY TOUCH PEN NEEDLES) 31G X 8 MM MISC Use to inject insulin as instructed. 02/02/12   Bartholomew Crews, MD  isosorbide mononitrate (IMDUR) 30 MG 24 hr tablet TAKE 1 TABLET BY MOUTH ONCE DAILY Patient taking differently: Take 30 mg by mouth daily. 11/04/14   Lorretta Harp, MD  metoprolol tartrate (LOPRESSOR) 50 MG tablet Take 0.5 tablets (25 mg total) by mouth 2 (two) times daily. 04/09/20   Elgergawy, Silver Huguenin, MD  pantoprazole (PROTONIX) 20 MG tablet TAKE 1 TABLET BY MOUTH DAILY Patient taking differently: Take 20 mg by mouth daily. 02/05/12   Dominic Pea, DO  pravastatin (PRAVACHOL) 40 MG tablet TAKE ONE TABLET  BY MOUTH EVERY EVENING Patient taking differently: Take 40 mg by mouth at bedtime. 06/28/12   Neta Ehlers, MD  rOPINIRole (REQUIP) 0.25 MG tablet Take 0.25 mg by mouth every evening. 02/27/20   [provider]  sodium bicarbonate 650 MG tablet Take 650 mg by mouth 2 (two) times daily. 03/22/20   [provider]  TOUJEO SOLOSTAR 300 UNIT/ML Solostar Pen Inject 10 Units into the skin at bedtime. 02/27/20   [provider]  Allergies    Codeine and Metronidazole  Review of Systems   Review of Systems  Respiratory: Positive for shortness of breath.   Neurological: Positive for weakness.  All other systems reviewed and are negative.   Physical Exam Updated Vital Signs BP (!) 150/68   Pulse 79   Temp (!) 97.5 F (36.4 C) (Oral)   Resp 17   SpO2 97%   Physical Exam Vitals and nursing note reviewed.  Constitutional:      Appearance: Normal appearance. She is obese.  HENT:     Head: Normocephalic and atraumatic.     Right Ear: External ear normal.     Left Ear: External ear normal.     Nose: Nose normal.     Mouth/Throat:     Mouth: Mucous membranes are dry.  Eyes:     Extraocular Movements: Extraocular movements intact.     Conjunctiva/sclera: Conjunctivae normal.     Pupils: Pupils are equal, round, and reactive to light.  Cardiovascular:     Rate and Rhythm: Normal rate and regular rhythm.     Pulses: Normal pulses.     Heart sounds: Normal heart sounds.  Pulmonary:     Effort: Pulmonary effort is normal.     Breath sounds: Normal breath sounds.  Abdominal:     General: Abdomen is flat. Bowel sounds are normal.     Palpations: Abdomen is soft.  Musculoskeletal:        General: Normal range of motion.     Cervical back: Normal range of motion and neck supple.  Skin:    General: Skin is warm.     Capillary Refill: Capillary refill takes less than 2 seconds.  Neurological:     General: No focal deficit present.     Mental Status: She is  alert. She is confused.  Psychiatric:        Mood and Affect: Mood normal.        Behavior: Behavior normal.     ED Results / Procedures / Treatments   Labs (all labs ordered are listed, but only abnormal results are displayed) Labs Reviewed  CBC WITH DIFFERENTIAL/PLATELET - Abnormal; Notable for the following components:      Result Value   RBC 3.25 (*)    Hemoglobin 9.0 (*)    HCT 29.7 (*)    RDW 16.2 (*)    Neutro Abs 8.0 (*)    Abs Immature Granulocytes 0.42 (*)    All other components within normal limits  COMPREHENSIVE METABOLIC PANEL - Abnormal; Notable for the following components:   CO2 14 (*)    Glucose, Bld 203 (*)    BUN 103 (*)    Creatinine, Ser 8.99 (*)    Calcium 8.5 (*)    Total Protein 5.8 (*)    Albumin 2.3 (*)    AST 52 (*)    GFR, Estimated 4 (*)    Anion gap 18 (*)    All other components within normal limits  D-DIMER, QUANTITATIVE (NOT AT [facility]) - Abnormal; Notable for the following components:   D-Dimer, Quant 6.00 (*)    All other components within normal limits  LACTATE DEHYDROGENASE - Abnormal; Notable for the following components:   LDH 281 (*)    All other components within normal limits  FERRITIN - Abnormal; Notable for the following components:   Ferritin 1,320 (*)    All other components within normal limits  FIBRINOGEN - Abnormal; Notable for the following components:  Fibrinogen 688 (*)    All other components within normal limits  C-REACTIVE PROTEIN - Abnormal; Notable for the following components:   CRP 20.6 (*)    All other components within normal limits  CULTURE, BLOOD (ROUTINE X 2)  CULTURE, BLOOD (ROUTINE X 2)  LACTIC ACID, PLASMA  PROCALCITONIN  TRIGLYCERIDES  LACTIC ACID, PLASMA    EKG EKG Interpretation  Date/Time:  Sunday April 11 2020 10:10:06 EST Ventricular Rate:  77 PR Interval:    QRS Duration: 166 QT Interval:  431 QTC Calculation: 488 R Axis:   69 Text Interpretation: Sinus rhythm Left bundle  branch block Confirmed by Isla Pence [phone]) on 04/11/2020 10:47:27 AM   Radiology DG Chest Port 1 View  Result Date: 04/11/2020 CLINICAL DATA:  Shortness of breath. Cough. Hypoxia. COVID-19 virus infection. EXAM: PORTABLE CHEST 1 VIEW COMPARISON:  04/03/2020 FINDINGS: Heart size remains normal. New mild airspace opacity is seen in the central right upper lobe and peripheral left upper lobe. No evidence of pleural effusion. IMPRESSION: New mild bilateral upper lobe airspace disease, suspicious for viral pneumonia. Electronically Signed   By: Marlaine Hind M.D.   On: 04/11/2020 11:30    Procedures Procedures (including critical care time)  Medications Ordered in ED Medications  0.9 %  sodium chloride infusion (has no administration in time range)  dexamethasone (DECADRON) injection 10 mg (has no administration in time range)    ED Course  I have reviewed the triage vital signs and the nursing notes.  Pertinent labs & imaging results that were available during my care of the patient were reviewed by me and considered in my medical decision making (see chart for details).    MDM Rules/Calculators/A&P                          The lowest RA O2 sat I saw was 89%.  She was put on 3L as 2L only brought it up to 91.  Now O2 sat in the mid-90s.   Cr is up to 8.99 from 6.26 on 12/17.  BUN is also going up which could be causing some of the confusion.  She was seen by nephrology in the hospital, but is not yet on dialysis.  Pt's Covid pneumonia looks to be progressing.  She has been treated with Remdesivir.  She was not given steroids as she was not hypoxic during her admission.  I am going to give her a dose of decadron in ED.  PCT was nl during admission.  It is still now; so I am going to hold on abx.  Pt d/w Dr. Earnest Conroy (triad) for admission.  Pt d/w Dr. Jonnie Finner (nephrology) who will see her.  Pt d/w her daughter.  She had an appt on 12/22 to get a fistula placed.   Pt's  daughter wants her to remain Full Code.    CRITICAL CARE Performed by: Isla Pence   Total critical care time: 30 minutes  Critical care time was exclusive of separately billable procedures and treating other patients.  Critical care was necessary to treat or prevent imminent or life-threatening deterioration.  Critical care was time spent personally by me on the following activities: development of treatment plan with patient and/or surrogate as well as nursing, discussions with consultants, evaluation of patient's response to treatment, examination of patient, obtaining history from patient or surrogate, ordering and performing treatments and interventions, ordering and review of laboratory studies, ordering and review of radiographic studies,  pulse oximetry and re-evaluation of patient's condition.  Makell Drohan Carcamo was evaluated in Emergency Department on 04/11/2020 for the symptoms described in the history of present illness. She was evaluated in the context of the global COVID-19 pandemic, which necessitated consideration that the patient might be at risk for infection with the SARS-CoV-2 virus that causes COVID-19. Institutional protocols and algorithms that pertain to the evaluation of patients at risk for COVID-19 are in a state of rapid change based on information released by regulatory bodies including the CDC and federal and state organizations. These policies and algorithms were followed during the patient's care in the ED.  Final Clinical Impression(s) / ED Diagnoses Final diagnoses:  Pneumonia due to COVID-19 virus  Acute renal failure superimposed on stage 5 chronic kidney disease, not on chronic dialysis, unspecified acute renal failure type ([place])  Acute respiratory failure with hypoxia ([facility])  Metabolic encephalopathy    Rx / DC Orders ED Discharge Orders    None       Isla Pence, MD 04/11/20 1226

## 2020-04-11 NOTE — ED Triage Notes (Signed)
Pt bib ems from home with reports of increased weakness, cough, confusion and hypoxia. Pt dx with covid approx 8 days ago. Family states worsening of symptoms over the last 2 days. Pt initially 70% on RA with EMS. Arrives on NRB at 100%. Pt in NAD.

## 2020-04-12 ENCOUNTER — Encounter (HOSPITAL_COMMUNITY): Payer: Medicare Other

## 2020-04-12 LAB — COMPREHENSIVE METABOLIC PANEL
ALT: 29 U/L (ref 0–44)
AST: 37 U/L (ref 15–41)
Albumin: 2.2 g/dL — ABNORMAL LOW (ref 3.5–5.0)
Alkaline Phosphatase: 70 U/L (ref 38–126)
Anion gap: 17 — ABNORMAL HIGH (ref 5–15)
BUN: 111 mg/dL — ABNORMAL HIGH (ref 8–23)
CO2: 15 mmol/L — ABNORMAL LOW (ref 22–32)
Calcium: 8.7 mg/dL — ABNORMAL LOW (ref 8.9–10.3)
Chloride: 110 mmol/L (ref 98–111)
Creatinine, Ser: 9.47 mg/dL — ABNORMAL HIGH (ref 0.44–1.00)
GFR, Estimated: 4 mL/min — ABNORMAL LOW (ref >60)
Glucose, Bld: 212 mg/dL — ABNORMAL HIGH (ref 70–99)
Potassium: 5 mmol/L (ref 3.5–5.1)
Sodium: 142 mmol/L (ref 135–145)
Total Bilirubin: 0.6 mg/dL (ref 0.3–1.2)
Total Protein: 5.6 g/dL — ABNORMAL LOW (ref 6.5–8.1)

## 2020-04-12 LAB — CBC WITH DIFFERENTIAL/PLATELET
Abs Immature Granulocytes: 0.49 10*3/uL — ABNORMAL HIGH (ref 0.00–0.07)
Basophils Absolute: 0 10*3/uL (ref 0.0–0.1)
Basophils Relative: 0 %
Eosinophils Absolute: 0 10*3/uL (ref 0.0–0.5)
Eosinophils Relative: 0 %
HCT: 25 % — ABNORMAL LOW (ref 36.0–46.0)
Hemoglobin: 7.7 g/dL — ABNORMAL LOW (ref 12.0–15.0)
Immature Granulocytes: 5 %
Lymphocytes Relative: 6 %
Lymphs Abs: 0.6 10*3/uL — ABNORMAL LOW (ref 0.7–4.0)
MCH: 28.1 pg (ref 26.0–34.0)
MCHC: 30.8 g/dL (ref 30.0–36.0)
MCV: 91.2 fL (ref 80.0–100.0)
Monocytes Absolute: 0.2 10*3/uL (ref 0.1–1.0)
Monocytes Relative: 2 %
Neutro Abs: 8.6 10*3/uL — ABNORMAL HIGH (ref 1.7–7.7)
Neutrophils Relative %: 87 %
Platelets: 306 10*3/uL (ref 150–400)
RBC: 2.74 MIL/uL — ABNORMAL LOW (ref 3.87–5.11)
RDW: 16.2 % — ABNORMAL HIGH (ref 11.5–15.5)
WBC: 10 10*3/uL (ref 4.0–10.5)
nRBC: 0.3 % — ABNORMAL HIGH (ref 0.0–0.2)

## 2020-04-12 LAB — PHOSPHORUS: Phosphorus: 6.6 mg/dL — ABNORMAL HIGH (ref 2.5–4.6)

## 2020-04-12 LAB — HEPATITIS B CORE ANTIBODY, TOTAL: Hep B Core Total Ab: NONREACTIVE

## 2020-04-12 LAB — HEPATITIS B SURFACE ANTIGEN: Hepatitis B Surface Ag: NONREACTIVE

## 2020-04-12 LAB — HEPATITIS B SURFACE ANTIBODY,QUALITATIVE: Hep B S Ab: NONREACTIVE

## 2020-04-12 LAB — PREPARE RBC (CROSSMATCH)

## 2020-04-12 LAB — FERRITIN: Ferritin: 819 ng/mL — ABNORMAL HIGH (ref 11–307)

## 2020-04-12 LAB — D-DIMER, QUANTITATIVE: D-Dimer, Quant: 7.15 ug/mL-FEU — ABNORMAL HIGH (ref 0.00–0.50)

## 2020-04-12 LAB — GLUCOSE, CAPILLARY
Glucose-Capillary: 151 mg/dL — ABNORMAL HIGH (ref 70–99)
Glucose-Capillary: 160 mg/dL — ABNORMAL HIGH (ref 70–99)
Glucose-Capillary: 183 mg/dL — ABNORMAL HIGH (ref 70–99)

## 2020-04-12 LAB — MAGNESIUM: Magnesium: 2.4 mg/dL (ref 1.7–2.4)

## 2020-04-12 MED ORDER — HEPARIN SODIUM (PORCINE) 10000 UNIT/ML IJ SOLN
7500.0000 [IU] | Freq: Three times a day (TID) | INTRAMUSCULAR | Status: DC
  Administered 2020-04-12 – 2020-04-13 (×4): 7500 [IU] via SUBCUTANEOUS
  Filled 2020-04-12 (×5): qty 1

## 2020-04-12 MED ORDER — SODIUM CHLORIDE 0.9% IV SOLUTION: Freq: Once | INTRAVENOUS | Status: DC

## 2020-04-12 NOTE — Progress Notes (Addendum)
Pt admitted to 5W31 from HD. Alert to self, won't answer other questions; sleepy. Skin intact, MASD bilat. Breasts IV R & L AC, SL. R IJ clean dry intact VS stable, Tele monitor attached and verified Call bell within reach Will continue to monitor.    04/12/20 1222  Vitals  Temp 98.3 F (36.8 C)  Temp Source Oral  BP (!) 122/59  MAP (mmHg) 79  BP Location Right Arm  BP Method Automatic  Patient Position (if appropriate) Lying  ECG Heart Rate 73  Resp 15  MEWS COLOR  MEWS Score Color Green  Oxygen Therapy  SpO2 95 %  O2 Device Room Air  MEWS Score  MEWS Temp 0  MEWS Systolic 0  MEWS Pulse 0  MEWS RR 0  MEWS LOC 0  MEWS Score 0

## 2020-04-12 NOTE — ED Notes (Signed)
Patient  Transported to 5c -3

## 2020-04-12 NOTE — Progress Notes (Signed)
PROGRESS NOTE                                                                             PROGRESS NOTE                                                                                                                                                                                                             Patient Demographics:    Ann [name], is a 69 y.o. female, DOB - May 28, 1950, SNK:[id number]  Outpatient Primary MD for the patient is [name], MD    LOS - 1  Admit date - 04/11/2020    Chief Complaint  Patient presents with  . Covid Positive       Brief Narrative    HPI: Ann [name] is a 69 y.o. female with history h/o stage IV CKD with associated anemia, CAD, T2DM, HTN, HLD, GERD, asthma, OSA on CPAP, chronic lumbar back pain who was recently admitted 12/11-12/17/21 to [facility] for COVID-19 related GI/resp symptoms treated with Remdesevir ( steroids not given as no hypoxia throughout the stay) presents to ED today with worsening symptoms. In the ED 12/11, she was felt to be dehydrated in addition to CKD baseline creatinine 2.7-3.3, creatinine was 5.3 on admission,did peak at 6.5,  plateaued around 6.2 range for 3 days prior to discharge. Diuretics and ACEiwere held on discharge and nephrology follow up arranged. She was deconditioned at discharge but declined SNF , went [facility].  Per daughter, patient has been eating poorly and sleeping most of the time since last hospital discharge.  She also appeared to be having labored breathing and unable to perform any of her ADLs.  Daughter asking if staff here can help her take a bath as she has been unable to get her to the bathroom.  Patient currently oriented to self/person only but not to place or time.  She is unable to provide any details regarding hospital visit but does report productive cough and shortness of breath.  ED findings today: Afebrile, SBP  1 20-160, RR 15-19, hypoxic on arrival  requiring NRB M transiently-transitioned to 3 L O2 now.  Lab work shows worsening renal function with BUN 86->103, creatinine 6.26->8.99, bicarb 14, potassium 4.6, albumin 2.6->2.3.  CRP 20.6, procalcitonin 2.68, lactate 1.7.  Ferritin 1320, LDH 281, triglyceride 134, D-dimer 2.6->6.0 WBC 10.3, hemoglobin 9.0, platelet 321.  Chest x-ray shows "new mild bilateral upper lobe airspace disease, suspicious for viral pneumonia."  She received 10 mg IV Decadron x1 and on IV fluids NS at 100 mL/h.  Per EDP, nephrology has been consulted and patient requested to be admitted for further evaluation and management.    Subjective:    Ann [name] today confused, unable to provide any complaints, but no significant events overnight as discussed with staff.     Assessment  & Plan :    Principal Problem:   Acute respiratory failure with hypoxia (HCC) Active Problems:   HYPERTENSION   CAD (coronary artery disease)   Obesity, Class III, BMI 40-49.9 (morbid obesity) ([place])   Acute encephalopathy   Chronic pain   2019 novel coronavirus disease (COVID-19)   GERD (gastroesophageal reflux disease)   AKI (acute kidney injury) ([place])  Acute Hypoxic Resp. Failure due to Acute Covid 19 Viral Pneumonitis during the ongoing 2020 Covid 19 Pandemic -  -She is unvaccinated -He was treated with steroids and Remdesivir during recent hospitalization, she is back on steroids. -Received Actemra on admission -Consistent inflammatory markers, D-dimers is elevated, but this can be as well related to renal failure, check venous Dopplers, will keep an intermediate dose DVT prophylaxis with subcu heparin of 7500 units every 8 hours. -Encouraged to use incentive spirometry and flutter valve.   Progressive AKI on CKD stage IV:  -Management per renal, started on hemodialysis this hospital stay, .  Acute metabolic encephalopathy: - Likely related to significant uremia (greater than 100) and COVID-19 infection.  Diabetes  mellitus type 2:  -Continue with insulin sliding scale for now giving her encephalopathy .  Anemia -The setting of chronic kidney disease, will transfuse 1 unit PRBC .  CAD: Resume home medications, aspirin, beta-blockers and statins.  Hypertension:  -Resume home medications, will however reduce Norvasc to 5 mg and titrate as needed.  Diuretics/ACE inhibitors held in last admission due to problem #2.  Chronic pain: Hold Neurontin/Cymbalta for now until mental status improves.  GERD: Resume PPI unless otherwise advised by nephrology.  Obesity class III BMI 40-49.9: Follow-up PCP, weight modifying recommendations upon discharge    SpO2: 96 % O2 Flow Rate (L/min): 3 L/min  Recent Labs  Lab 04/06/20 0343 04/07/20 0416 04/07/20 0549 04/08/20 0403 04/09/20 0407 04/11/20 1031 04/11/20 1032 04/11/20 1712 04/12/20 0416  WBC 6.1  --   --  7.0 7.8  --  10.3 10.7* 10.0  PLT 182  --   --  198 220  --  321 286 306  CRP 7.8* 7.8*  --  14.4*  --   --  20.6*  --   --   DDIMER 2.31* 2.01*  --  1.86*  --   --  6.00*  --  7.15*  PROCALCITON  --   --   --   --   --   --  2.68  --   --   AST 47*  --   --   --   --   --  52*  --  37  ALT 25  --   --   --   --   --  34  --  29  ALKPHOS 47  --   --   --   --   --  73  --  70  BILITOT 0.5  --   --   --   --   --  0.9  --  0.6  ALBUMIN 2.8*  --  2.6* 2.5* 2.5*  --  2.3*  --  2.2*  LATICACIDVEN  --   --   --   --   --  1.7  --  1.1  --        ABG     Component Value Date/Time   TCO2 20 09/27/2008 1636            Condition - Extremely Guarded  Family Communication  : Disussed with daughter [name] by phone  Code Status :  FUll  Consults  :  Renal, PCCM  Procedures  : A hemodialysis catheter placement by [facility] 12/19  Disposition Plan  :    Status is: Inpatient  Remains inpatient appropriate because:Hemodynamically unstable   Dispo: The patient is from: Home              Anticipated d/c is to: SNF               Anticipated d/c date is: > 3 days              Patient currently is not medically stable to d/c.      DVT Prophylaxis  :  Heparin  Lab Results  Component Value Date   PLT 306 04/12/2020    Diet :  Diet Order            Diet renal/carb modified with fluid restriction Diet-HS Snack? Nothing; Fluid restriction: 1200 mL Fluid; Room service appropriate? No; Fluid consistency: Thin  Diet effective now                  Inpatient Medications  Scheduled Meds: . sodium chloride   Intravenous Once  . vitamin C  500 mg Oral Daily  . aspirin EC  81 mg Oral Daily  . Chlorhexidine Gluconate Cloth  6 each Topical Q0600  . dexamethasone (DECADRON) injection  6 mg Intravenous Q24H  . doxycycline  100 mg Oral Q12H  . heparin  7,500 Units Subcutaneous Q8H  . hydrALAZINE  50 mg Oral TID  . insulin aspart  0-6 Units Subcutaneous TID WC  . Ipratropium-Albuterol  1 puff Inhalation Q6H  . metoprolol tartrate  25 mg Oral BID  . nystatin   Topical BID  . pantoprazole  20 mg Oral Daily  . pravastatin  40 mg Oral QPM  . rOPINIRole  0.25 mg Oral QPM  . sodium bicarbonate  650 mg Oral BID   Continuous Infusions: . cefTRIAXone (ROCEPHIN)  IV Stopped (04/11/20 1515)   PRN Meds:.albuterol, bisacodyl, guaiFENesin-dextromethorphan, ondansetron **OR** ondansetron (ZOFRAN) IV  Antibiotics  :    Anti-infectives (From admission, onward)   Start     Dose/Rate Route Frequency Ordered Stop   04/11/20 1300  cefTRIAXone (ROCEPHIN) 1 g in sodium chloride 0.9 % 100 mL IVPB        1 g 200 mL/hr over 30 Minutes Intravenous Every 24 hours 04/11/20 1252     04/11/20 1300  doxycycline (VIBRA-TABS) tablet 100 mg        100 mg Oral Every 12 hours 04/11/20 1252          Emeline Gins Eain Mullendore M.D on 04/12/2020 at 2:46 PM  To page go to [email or web address]  [facility] -  Office  [phone]       Objective:   Vitals:   04/12/20 1000 04/12/20 1030 04/12/20 1100 04/12/20 1130  BP: 125/70 131/63  132/60 125/60  Pulse: 82 75 75 73  Resp: 18 17 15 11   Temp:      TempSrc:    Oral  SpO2:  97% 97% 96%    Wt Readings from Last 3 Encounters:  04/09/20 99 kg  02/25/19 115 kg  01/19/15 114.8 kg     Intake/Output Summary (Last 24 hours) at 04/12/2020 1446 Last data filed at 04/12/2020 1113 Gross per 24 hour  Intake --  Output 2000 ml  Net -2000 ml     Physical Exam  Sleeping comfortably, wakes up to verbal stimuli but, but she is confused, able to answer questions Symmetrical Chest wall movement, Good air movement bilaterally, CTAB RRR,No Gallops,Rubs or new Murmurs, No Parasternal Heave +ve B.Sounds, Abd Soft, No tenderness,  No rebound - guarding or rigidity. No Cyanosis, Clubbing or edema, No new Rash or bruise     Data Review:    CBC Recent Labs  Lab 04/06/20 0343 04/08/20 0403 04/09/20 0407 04/11/20 1032 04/11/20 1712 04/12/20 0416  WBC 6.1 7.0 7.8 10.3 10.7* 10.0  HGB 9.0* 8.8* 8.8* 9.0* 8.0* 7.7*  HCT 27.4* 28.5* 27.9* 29.7* 26.2* 25.0*  PLT 182 198 220 321 286 306  MCV 88.1 91.1 90.6 91.4 91.9 91.2  MCH 28.9 28.1 28.6 27.7 28.1 28.1  MCHC 32.8 30.9 31.5 30.3 30.5 30.8  RDW 14.7 15.1 15.5 16.2* 16.3* 16.2*  LYMPHSABS 0.8  --   --  1.0  --  0.6*  MONOABS 0.4  --   --  0.8  --  0.2  EOSABS 0.0  --   --  0.1  --  0.0  BASOSABS 0.0  --   --  0.0  --  0.0    Recent Labs  Lab 04/06/20 0343 04/07/20 0416 04/07/20 0549 04/08/20 0403 04/09/20 0407 04/11/20 1031 04/11/20 1032 04/11/20 1712 04/12/20 0416  NA 141  --  143 141 142  --  141  --  142  K 3.5  --  3.6 3.6 3.9  --  4.6  --  5.0  CL 107  --  109 111 110  --  109  --  110  CO2 20*  --  17* 16* 16*  --  14*  --  15*  GLUCOSE 89  --  74 88 121*  --  203*  --  212*  BUN 88*  --  92* 86* 86*  --  103*  --  111*  CREATININE 6.54*  --  6.46* 6.20* 6.26*  --  8.99*  --  9.47*  CALCIUM 8.3*  --  7.9* 7.7* 8.0*  --  8.5*  --  8.7*  AST 47*  --   --   --   --   --  52*  --  37  ALT 25  --   --    --   --   --  34  --  29  ALKPHOS 47  --   --   --   --   --  73  --  70  BILITOT 0.5  --   --   --   --   --  0.9  --  0.6  ALBUMIN 2.8*  --  2.6* 2.5* 2.5*  --  2.3*  --  2.2*  MG 2.4  --   --   --   --   --   --   --  2.4  CRP 7.8* 7.8*  --  14.4*  --   --  20.6*  --   --   DDIMER 2.31* 2.01*  --  1.86*  --   --  6.00*  --  7.15*  PROCALCITON  --   --   --   --   --   --  2.68  --   --   LATICACIDVEN  --   --   --   --   --  1.7  --  1.1  --     ------------------------------------------------------------------------------------------------------------------ Recent Labs    04/11/20 1032  TRIG 134    Lab Results  Component Value Date   HGBA1C 6.6 (H) 04/04/2020   ------------------------------------------------------------------------------------------------------------------ No results for input(s): TSH, T4TOTAL, T3FREE, THYROIDAB in the last 72 hours.  Invalid input(s): FREET3  Cardiac Enzymes No results for input(s): CKMB, TROPONINI, MYOGLOBIN in the last 168 hours.  Invalid input(s): CK ------------------------------------------------------------------------------------------------------------------ No results found for: BNP  Micro Results Recent Results (from the past 240 hour(s))  Resp Panel by RT-PCR (Flu A&B, Covid) Nasopharyngeal Swab     Status: Abnormal   Collection Time: 04/03/20  2:56 PM   Specimen: Nasopharyngeal Swab; Nasopharyngeal(NP) swabs in vial transport medium  Result Value Ref Range Status   SARS Coronavirus 2 by RT PCR POSITIVE (A) NEGATIVE Final    Comment: RESULT CALLED TO, READ BACK BY AND VERIFIED WITH: DR RAY @ 2005 04/03/20 [facility] (NOTE) SARS-CoV-2 target nucleic acids are DETECTED.  The SARS-CoV-2 RNA is generally detectable in upper respiratory specimens during the acute phase of infection. Positive results are indicative of the presence of the identified virus, but do not rule out bacterial infection or co-infection with other pathogens  not detected by the test. Clinical correlation with patient history and other diagnostic information is necessary to determine patient infection status. The expected result is Negative.  Fact Sheet for Patients: EntrepreneurPulse.com.au  Fact Sheet for Healthcare Providers: IncredibleEmployment.be  This test is not yet approved or cleared by the [place] FDA and  has been authorized for detection and/or diagnosis of SARS-CoV-2 by FDA under an Emergency Use Authorization (EUA).  This EUA will remain in effect (meaning this test can be used)  for the duration of  the COVID-19 declaration under Section 564(b)(1) of the Act, 21 U.S.C. section 360bbb-3(b)(1), unless the authorization is terminated or revoked sooner.     Influenza A by PCR NEGATIVE NEGATIVE Final   Influenza B by PCR NEGATIVE NEGATIVE Final    Comment: (NOTE) The Xpert Xpress SARS-CoV-2/FLU/RSV plus assay is intended as an aid in the diagnosis of influenza from Nasopharyngeal swab specimens and should not be used as a sole basis for treatment. Nasal washings and aspirates are unacceptable for Xpert Xpress SARS-CoV-2/FLU/RSV testing.  Fact Sheet for Patients: EntrepreneurPulse.com.au  Fact Sheet for Healthcare Providers: IncredibleEmployment.be  This test is not yet approved or cleared by the [place] FDA and has been authorized for detection and/or diagnosis of SARS-CoV-2 by FDA under an Emergency Use Authorization (EUA). This EUA will remain in effect (meaning this test can be used) for the duration of the COVID-19 declaration under Section 564(b)(1) of the Act, 21 U.S.C. section 360bbb-3(b)(1), unless the authorization is terminated or revoked.  Performed at [facility], [place] [address], [place], [place] [address]   Blood culture (  routine x 2)     Status: None   Collection Time: 04/03/20  3:00 PM   Specimen:  BLOOD  Result Value Ref Range Status   Specimen Description   Final    BLOOD RIGHT ANTECUBITAL Performed at [facility] [address] [place], [place] [address]    Special Requests   Final    BOTTLES DRAWN AEROBIC AND ANAEROBIC Blood Culture adequate volume Performed at [facility] [address] [place], [place] [address]    Culture   Final    NO GROWTH 6 DAYS Performed at [facility], [place] [address] [place], [place] [address]    Report Status 04/09/2020 FINAL  Final  Blood culture (routine x 2)     Status: None   Collection Time: 04/03/20  3:41 PM   Specimen: BLOOD  Result Value Ref Range Status   Specimen Description   Final    BLOOD BLOOD LEFT HAND Performed at [facility] [address], [place], [place] [address]    Special Requests   Final    BOTTLES DRAWN AEROBIC AND ANAEROBIC Blood Culture adequate volume Performed at [facility] [address] [place], [place] [address]    Culture   Final    NO GROWTH 6 DAYS Performed at [facility], [place] [address] [place], [place] [address]    Report Status 04/09/2020 FINAL  Final  Blood Culture (routine x 2)     Status: None (Preliminary result)   Collection Time: 04/11/20 10:18 AM   Specimen: BLOOD  Result Value Ref Range Status   Specimen Description BLOOD RIGHT ANTECUBITAL  Final   Special Requests   Final    BOTTLES DRAWN AEROBIC ONLY Blood Culture adequate volume   Culture   Final    NO GROWTH < 24 HOURS Performed at [facility], [place] [address] [place], [place] [address]    Report Status PENDING  Incomplete  Blood Culture (routine x 2)     Status: None (Preliminary result)   Collection Time: 04/11/20 10:23 AM   Specimen: BLOOD  Result Value Ref Range Status   Specimen Description BLOOD LEFT ANTECUBITAL  Final   Special Requests   Final    BOTTLES DRAWN AEROBIC AND ANAEROBIC Blood Culture adequate volume    Culture   Final    NO GROWTH < 24 HOURS Performed at [facility], [place] [address], [place], [place] [address]    Report Status PENDING  Incomplete  MRSA PCR Screening     Status: None   Collection Time: 04/11/20 12:56 PM   Specimen: Nasal Mucosa; Nasopharyngeal  Result Value Ref Range Status   MRSA by PCR NEGATIVE NEGATIVE Final    Comment:        The GeneXpert MRSA Assay (FDA approved for NASAL specimens only), is one component of a comprehensive MRSA colonization surveillance program. It is not intended to diagnose MRSA infection nor to guide or monitor treatment for MRSA infections. Performed at [facility], [place] [address], [place], [place] [address]     Radiology Reports CT ABDOMEN PELVIS WO CONTRAST  Result Date: 04/03/2020 CLINICAL DATA:  Abdominal pain. EXAM: CT ABDOMEN AND PELVIS WITHOUT CONTRAST TECHNIQUE: Multidetector CT imaging of the abdomen and pelvis was performed following the standard protocol without IV contrast. COMPARISON:  02/20/2019 FINDINGS: Lower chest: Patchy ground-glass infiltrates in the right lower lobe could be due to developing pneumonia,  atypical pneumonia or acute aspiration. No pleural effusions. The heart is normal in size. No pericardial effusion. Three-vessel coronary artery calcifications are noted. Small hiatal hernia. Hepatobiliary: No hepatic lesions are identified without contrast. No intrahepatic biliary dilatation. The gallbladder is surgically absent. No common bile duct dilatation. Pancreas: Marked fatty atrophy of the pancreas. No mass, inflammation or ductal dilatation. Spleen: Normal size.  No focal lesions. Adrenals/Urinary Tract: The adrenal glands are unremarkable. Small, chronically atrophied kidneys consistent with chronic renal failure. No worrisome renal lesions or hydroureteronephrosis. The bladder is unremarkable. Stomach/Bowel: The stomach, duodenum, small bowel and colon are unremarkable. No acute inflammatory  changes, mass lesions or obstructive findings. The terminal ileum is normal. Scattered colonic diverticulosis but no findings for acute diverticulitis. Vascular/Lymphatic: Significant diffuse vascular calcifications. No aneurysm. No mesenteric or retroperitoneal mass or adenopathy. No free air or free fluid. No leaking oral contrast. Reproductive: The uterus is surgically absent. Both ovaries are still present and appear normal. Other: Moderate-sized periumbilical abdominal wall hernia containing fat. Musculoskeletal: No significant bony findings. IMPRESSION: 1. No acute abdominal/pelvic findings, mass lesions or adenopathy. 2. Patchy ground-glass infiltrates in the right lower lobe could be due to developing pneumonia, atypical pneumonia or acute aspiration. 3. Status post cholecystectomy. No biliary dilatation. 4. Small, chronically atrophied kidneys consistent with chronic renal failure. 5. Moderate-sized periumbilical abdominal wall hernia containing fat. 6. Aortic atherosclerosis. Aortic Atherosclerosis (ICD10-I70.0). Electronically Signed   By: Marijo Sanes M.D.   On: 04/03/2020 19:19   CT Head Wo Contrast  Result Date: 04/03/2020 CLINICAL DATA:  Sudden onset of leg weakness today. EXAM: CT HEAD WITHOUT CONTRAST TECHNIQUE: Contiguous axial images were obtained from the base of the skull through the vertex without intravenous contrast. COMPARISON:  None. FINDINGS: Brain: Age advanced cerebral atrophy, ventriculomegaly and mild periventricular white matter disease. No extra-axial fluid collections are identified. No CT findings for acute hemispheric infarction or intracranial hemorrhage. No mass lesions. The brainstem and cerebellum are normal. Vascular: Moderate to advanced vascular calcifications but no aneurysm or hyperdense vessels. Skull: No skull fracture or bone lesions. Sinuses/Orbits: The paranasal sinuses and mastoid air cells are clear except for a 2 cm mucous retention cyst or polyp in the left  maxillary sinus. The globes are intact. Other: No scalp lesions or scalp hematoma. IMPRESSION: 1. Age advanced cerebral atrophy, ventriculomegaly and mild periventricular white matter disease. 2. No acute intracranial findings or mass lesions. Electronically Signed   By: Marijo Sanes M.D.   On: 04/03/2020 19:14   US RENAL  Result Date: 04/04/2020 CLINICAL DATA:  Acute kidney injury EXAM: RENAL / URINARY TRACT ULTRASOUND COMPLETE COMPARISON:  CT 04/03/2020 FINDINGS: Technical note: Significantly limited examination secondary to poor penetration related to patient body habitus and shadowing from overlying bowel gas. Right Kidney: Limited. Echogenic renal parenchyma with cortical thinning compatible with chronic medical renal disease. Right renal length of approximately 10.6 cm. No shadowing stone or hydronephrosis is identified. Left Kidney: Very limited evaluation. Visualized left kidney appears diffusely echogenic with renal cortical thinning compatible with chronic medical renal disease. No obvious hydronephrosis. Bladder: Appears normal for degree of bladder distention. Other: None. IMPRESSION: 1. Limited exam. 2. No sonographic evidence of obstructive uropathy. 3. Atrophic, echogenic kidneys compatible with chronic medical renal disease. Electronically Signed   By: Davina Poke D.O.   On: 04/04/2020 12:18   DG CHEST PORT 1 VIEW  Result Date: 04/11/2020 CLINICAL DATA:  Hemodialysis catheter placement EXAM: PORTABLE CHEST 1 VIEW COMPARISON:  April 11, 2020 FINDINGS:  The heart size is enlarged. Bilateral airspace disease is again noted and has progressed in the right mid and right upper lung zones. There is vascular congestion with small bilateral pleural effusions. There is a right-sided central venous catheter with tip projecting over the mid SVC. The catheter fall short of the cavoatrial junction by approximately 6-7 cm. There is no evidence for pneumothorax. Aortic calcifications are noted. The  main pulmonary artery is dilated. IMPRESSION: 1. Interval worsening of bilateral airspace disease, greatest in the right mid and right upper lung zones. 2. Hemodialysis catheter as above. While the tip likely terminates in the SVC, a catheter fall short of the cavoatrial junction by at least 6 cm. 3. Persistent cardiomegaly with vascular congestion and small bilateral pleural effusions. Electronically Signed   By: Constance Holster M.D.   On: 04/11/2020 17:23   DG Chest Port 1 View  Result Date: 04/11/2020 CLINICAL DATA:  Shortness of breath. Cough. Hypoxia. COVID-19 virus infection. EXAM: PORTABLE CHEST 1 VIEW COMPARISON:  04/03/2020 FINDINGS: Heart size remains normal. New mild airspace opacity is seen in the central right upper lobe and peripheral left upper lobe. No evidence of pleural effusion. IMPRESSION: New mild bilateral upper lobe airspace disease, suspicious for viral pneumonia. Electronically Signed   By: Marlaine Hind M.D.   On: 04/11/2020 11:30   DG Chest Port 1 View  Result Date: 04/03/2020 CLINICAL DATA:  Right lower lobe infiltrate EXAM: PORTABLE CHEST 1 VIEW COMPARISON:  Same day CT abdomen pelvis, 04/03/2020, chest radiographs, 04/05/2014 FINDINGS: The heart size and mediastinal contours are within normal limits. Subtle heterogeneous airspace opacity of the right lung base. The visualized skeletal structures are unremarkable. IMPRESSION: Subtle heterogeneous airspace opacity of the right lung base, in keeping with findings of prior CT and consistent with infection or aspiration. Electronically Signed   By: Eddie Candle M.D.   On: 04/03/2020 20:23   ECHOCARDIOGRAM COMPLETE  Result Date: 04/04/2020    ECHOCARDIOGRAM REPORT   Patient Name:   ELIANY MCCARTER Siever Date of Exam: 04/04/2020 Medical Rec #:  [id number]     Height:       62.0 in Accession #:    [id number]    Weight:       211.2 lb Date of Birth:  1950/08/21     BSA:          1.956 m Patient Age:    62 years      BP:           146/78  mmHg Patient Gender: F             HR:           82 bpm. Exam Location:  Inpatient Procedure: 2D Echo, Cardiac Doppler, Color Doppler and Intracardiac            Opacification Agent Indications:    Abnormal ECG 794.31 / R94.31  History:        Patient has prior history of Echocardiogram examinations, most                 recent 11/11/1996. CAD, Signs/Symptoms:Shortness of Breath and                 Chest Pain; Risk Factors:Hypertension, Diabetes and                 Dyslipidemia.  Sonographer:    Bernadene Person RDCS Referring Phys: [id number] [place]  1. Left ventricular ejection fraction, by estimation, is 60  to 65%. The left ventricle has normal function. The left ventricle has no regional wall motion abnormalities. Left ventricular diastolic parameters are consistent with Grade II diastolic dysfunction (pseudonormalization). Elevated left ventricular end-diastolic pressure.  2. Right ventricular systolic function is normal. The right ventricular size is normal. There is normal pulmonary artery systolic pressure.  3. The mitral valve is degenerative. Trivial mitral valve regurgitation. No evidence of mitral stenosis. Moderate mitral annular calcification.  4. The aortic valve is tricuspid. Aortic valve regurgitation is not visualized. Mild to moderate aortic valve sclerosis/calcification is present, without any evidence of aortic stenosis.  5. The inferior vena cava is normal in size with greater than 50% respiratory variability, suggesting right atrial pressure of 3 mmHg. FINDINGS  Left Ventricle: Left ventricular ejection fraction, by estimation, is 60 to 65%. The left ventricle has normal function. The left ventricle has no regional wall motion abnormalities. Definity contrast agent was given IV to delineate the left ventricular  endocardial borders. The left ventricular internal cavity size was normal in size. There is no left ventricular hypertrophy. Left ventricular diastolic parameters are  consistent with Grade II diastolic dysfunction (pseudonormalization). Elevated left ventricular end-diastolic pressure. Right Ventricle: The right ventricular size is normal. No increase in right ventricular wall thickness. Right ventricular systolic function is normal. There is normal pulmonary artery systolic pressure. The tricuspid regurgitant velocity is 2.75 m/s, and  with an assumed right atrial pressure of 3 mmHg, the estimated right ventricular systolic pressure is 79.8 mmHg. Left Atrium: Left atrial size was normal in size. Right Atrium: Right atrial size was normal in size. Pericardium: There is no evidence of pericardial effusion. Mitral Valve: The mitral valve is degenerative in appearance. There is moderate thickening of the mitral valve leaflet(s). There is moderate calcification of the mitral valve leaflet(s). Moderate mitral annular calcification. Trivial mitral valve regurgitation. No evidence of mitral valve stenosis. Tricuspid Valve: The tricuspid valve is normal in structure. Tricuspid valve regurgitation is mild . No evidence of tricuspid stenosis. Aortic Valve: The aortic valve is tricuspid. Aortic valve regurgitation is not visualized. Mild to moderate aortic valve sclerosis/calcification is present, without any evidence of aortic stenosis. Pulmonic Valve: The pulmonic valve was normal in structure. Pulmonic valve regurgitation is not visualized. No evidence of pulmonic stenosis. Aorta: The aortic root is normal in size and structure. Venous: The inferior vena cava is normal in size with greater than 50% respiratory variability, suggesting right atrial pressure of 3 mmHg. IAS/Shunts: No atrial level shunt detected by color flow Doppler.  LEFT VENTRICLE PLAX 2D LVIDd:         5.80 cm  Diastology LVIDs:         4.10 cm  LV e' medial:    3.81 cm/s LV PW:         0.70 cm  LV E/e' medial:  24.3 LV IVS:        1.00 cm  LV e' lateral:   4.57 cm/s LVOT diam:     2.00 cm  LV E/e' lateral: 20.2 LV SV:          78 LV SV Index:   40 LVOT Area:     3.14 cm  RIGHT VENTRICLE RV S prime:     13.10 cm/s TAPSE (M-mode): 2.0 cm LEFT ATRIUM             Index       RIGHT ATRIUM           Index LA diam:  3.40 cm 1.74 cm/m  RA Area:     18.10 cm LA Vol (A2C):   56.0 ml 28.62 ml/m RA Volume:   47.40 ml  24.23 ml/m LA Vol (A4C):   51.1 ml 26.12 ml/m LA Biplane Vol: 53.4 ml 27.29 ml/m  AORTIC VALVE LVOT Vmax:   104.00 cm/s LVOT Vmean:  86.300 cm/s LVOT VTI:    0.249 m  AORTA Ao Root diam: 3.30 cm Ao Asc diam:  3.60 cm MITRAL VALVE                TRICUSPID VALVE MV Area (PHT): 2.48 cm     TR Peak grad:   30.2 mmHg MV Decel Time: 306 msec     TR Vmax:        275.00 cm/s MV E velocity: 92.40 cm/s MV A velocity: 138.00 cm/s  SHUNTS MV E/A ratio:  0.67         Systemic VTI:  0.25 m                             Systemic Diam: 2.00 cm Jenkins Rouge MD Electronically signed by Jenkins Rouge MD Signature Date/Time: 04/04/2020/11:23:04 AM    Final

## 2020-04-12 NOTE — Procedures (Signed)
I was present at this dialysis session. I have reviewed the session itself and made appropriate changes.   Vital signs in last 24 hours:  Temp:  [97.8 F (36.6 C)-98.3 F (36.8 C)] 97.8 F (36.6 C) (12/20 0808) Pulse Rate:  [54-82] 75 (12/20 1030) Resp:  [8-25] 17 (12/20 1030) BP: (100-164)/(51-100) 131/63 (12/20 1030) SpO2:  [89 %-100 %] 97 % (12/20 1030) Weight change:  [employer]    Recent Labs  Lab 04/12/20 0416  NA 142  K 5.0  CL 110  CO2 15*  GLUCOSE 212*  BUN 111*  CREATININE 9.47*  CALCIUM 8.7*  PHOS 6.6*    Recent Labs  Lab 04/06/20 0343 04/08/20 0403 04/11/20 1032 04/11/20 1712 04/12/20 0416  WBC 6.1   < > 10.3 10.7* 10.0  NEUTROABS 4.7  --  8.0*  --  8.6*  HGB 9.0*   < > 9.0* 8.0* 7.7*  HCT 27.4*   < > 29.7* 26.2* 25.0*  MCV 88.1   < > 91.4 91.9 91.2  PLT 182   < > 321 286 306   < > = values in this interval not displayed.    Scheduled Meds: . sodium chloride   Intravenous Once  . vitamin C  500 mg Oral Daily  . aspirin EC  81 mg Oral Daily  . Chlorhexidine Gluconate Cloth  6 each Topical Q0600  . dexamethasone (DECADRON) injection  6 mg Intravenous Q24H  . doxycycline  100 mg Oral Q12H  . heparin  7,500 Units Subcutaneous Q8H  . hydrALAZINE  50 mg Oral TID  . insulin aspart  0-6 Units Subcutaneous TID WC  . Ipratropium-Albuterol  1 puff Inhalation Q6H  . metoprolol tartrate  25 mg Oral BID  . nystatin   Topical BID  . pantoprazole  20 mg Oral Daily  . pravastatin  40 mg Oral QPM  . rOPINIRole  0.25 mg Oral QPM  . sodium bicarbonate  650 mg Oral BID   Continuous Infusions: . cefTRIAXone (ROCEPHIN)  IV Stopped (04/11/20 1515)   PRN Meds:.albuterol, bisacodyl, guaiFENesin-dextromethorphan, ondansetron **OR** ondansetron (ZOFRAN) IV   Assessment and plan: 1. AKI/CKD stage IV- (baseline Scr 2.7-3.3) rising bun/creat in setting of covid-19 infection.  S/p HD 04/11/20 due to AMS and having second treatment today.  She has tolerated it well  and will follow UOP and daily BUN/Cr 2. covid-19 infection- per primary team. 3. HTN/Volume- UF with HD and follow 4. CAD- stable 5. Anemia of CKD stage IV- low Hgb at 7.7.  Start ESA and follow H/H and iron stores.  Transfuse prn. 6. Vascular access- RIJ temp HD catheter placed 04/11/20.  Had been referred to [facility] for access placement.  Will likely need tunneled HD catheter if no significant improvement of UOP and Scr. Donetta Potts,  MD 04/12/2020, 11:20 AM

## 2020-04-12 NOTE — ED Notes (Signed)
Report given to Hemodialysis Unit nurse , waiting call/notification for transport .

## 2020-04-12 NOTE — Progress Notes (Signed)
Attempted lower extremity venous duplex x2. Patient unavailable. Will try again later.   Abram Sander 04/12/2020 2:26 PM

## 2020-04-13 ENCOUNTER — Inpatient Hospital Stay (HOSPITAL_COMMUNITY): Payer: Medicare Other

## 2020-04-13 DIAGNOSIS — R7989 Other specified abnormal findings of blood chemistry: Secondary | ICD-10-CM

## 2020-04-13 DIAGNOSIS — I82402 Acute embolism and thrombosis of unspecified deep veins of left lower extremity: Secondary | ICD-10-CM

## 2020-04-13 DIAGNOSIS — U071 COVID-19: Secondary | ICD-10-CM

## 2020-04-13 LAB — CBC WITH DIFFERENTIAL/PLATELET
Abs Immature Granulocytes: 0.23 10*3/uL — ABNORMAL HIGH (ref 0.00–0.07)
Basophils Absolute: 0 10*3/uL (ref 0.0–0.1)
Basophils Relative: 0 %
Eosinophils Absolute: 0 10*3/uL (ref 0.0–0.5)
Eosinophils Relative: 0 %
HCT: 30 % — ABNORMAL LOW (ref 36.0–46.0)
Hemoglobin: 9.6 g/dL — ABNORMAL LOW (ref 12.0–15.0)
Immature Granulocytes: 2 %
Lymphocytes Relative: 5 %
Lymphs Abs: 0.5 10*3/uL — ABNORMAL LOW (ref 0.7–4.0)
MCH: 27.8 pg (ref 26.0–34.0)
MCHC: 32 g/dL (ref 30.0–36.0)
MCV: 87 fL (ref 80.0–100.0)
Monocytes Absolute: 0.3 10*3/uL (ref 0.1–1.0)
Monocytes Relative: 3 %
Neutro Abs: 9.2 10*3/uL — ABNORMAL HIGH (ref 1.7–7.7)
Neutrophils Relative %: 90 %
Platelets: 266 10*3/uL (ref 150–400)
RBC: 3.45 MIL/uL — ABNORMAL LOW (ref 3.87–5.11)
RDW: 15.5 % (ref 11.5–15.5)
WBC: 10.3 10*3/uL (ref 4.0–10.5)
nRBC: 0.2 % (ref 0.0–0.2)

## 2020-04-13 LAB — TYPE AND SCREEN
ABO/RH(D): O POS
Antibody Screen: NEGATIVE
Unit division: 0

## 2020-04-13 LAB — BPAM RBC
Blood Product Expiration Date: 202201182359
ISSUE DATE / TIME: 202112201827
Unit Type and Rh: 5100

## 2020-04-13 LAB — COMPREHENSIVE METABOLIC PANEL
ALT: 24 U/L (ref 0–44)
AST: 27 U/L (ref 15–41)
Albumin: 2.4 g/dL — ABNORMAL LOW (ref 3.5–5.0)
Alkaline Phosphatase: 67 U/L (ref 38–126)
Anion gap: 20 — ABNORMAL HIGH (ref 5–15)
BUN: 80 mg/dL — ABNORMAL HIGH (ref 8–23)
CO2: 18 mmol/L — ABNORMAL LOW (ref 22–32)
Calcium: 8.7 mg/dL — ABNORMAL LOW (ref 8.9–10.3)
Chloride: 104 mmol/L (ref 98–111)
Creatinine, Ser: 6.8 mg/dL — ABNORMAL HIGH (ref 0.44–1.00)
GFR, Estimated: 6 mL/min — ABNORMAL LOW (ref >60)
Glucose, Bld: 274 mg/dL — ABNORMAL HIGH (ref 70–99)
Potassium: 4.4 mmol/L (ref 3.5–5.1)
Sodium: 142 mmol/L (ref 135–145)
Total Bilirubin: 0.8 mg/dL (ref 0.3–1.2)
Total Protein: 5.8 g/dL — ABNORMAL LOW (ref 6.5–8.1)

## 2020-04-13 LAB — GLUCOSE, CAPILLARY
Glucose-Capillary: 236 mg/dL — ABNORMAL HIGH (ref 70–99)
Glucose-Capillary: 251 mg/dL — ABNORMAL HIGH (ref 70–99)
Glucose-Capillary: 251 mg/dL — ABNORMAL HIGH (ref 70–99)
Glucose-Capillary: 288 mg/dL — ABNORMAL HIGH (ref 70–99)

## 2020-04-13 LAB — D-DIMER, QUANTITATIVE: D-Dimer, Quant: 5.31 ug/mL-FEU — ABNORMAL HIGH (ref 0.00–0.50)

## 2020-04-13 MED ORDER — APIXABAN 5 MG PO TABS
10.0000 mg | ORAL_TABLET | Freq: Two times a day (BID) | ORAL | Status: AC
  Administered 2020-04-13 – 2020-04-19 (×13): 10 mg via ORAL
  Filled 2020-04-13 (×15): qty 2

## 2020-04-13 MED ORDER — APIXABAN 5 MG PO TABS
5.0000 mg | ORAL_TABLET | Freq: Two times a day (BID) | ORAL | Status: DC
  Administered 2020-04-20: 5 mg via ORAL
  Filled 2020-04-13: qty 1

## 2020-04-13 NOTE — Progress Notes (Signed)
Inpatient Diabetes Program Recommendations  AACE/ADA: New Consensus Statement on Inpatient Glycemic Control   Target Ranges:  Prepandial:   less than 140 mg/dL      Peak postprandial:   less than 180 mg/dL (1-2 hours)      Critically ill patients:  140 - 180 mg/dL   Results for BEAUTY, PLESS (MRN [id number]) as of 04/13/2020 11:09  Ref. Range 04/12/2020 12:23 04/12/2020 16:59 04/12/2020 21:01 04/13/2020 07:25  Glucose-Capillary Latest Ref Range: 70 - 99 mg/dL 151 (H) 160 (H) 183 (H) 236 (H)   Review of Glycemic Control  Diabetes history: DM2 Outpatient Diabetes medications: Toujeo 10 units QHS Current orders for Inpatient glycemic control: Novolog 0-6 units TID with meals; Decadron 6 mg Q24H  Inpatient Diabetes Program Recommendations:    Insulin: If steroids are continued and glucose remains consistently over 180 mg/dl, please consider ordering Lantus 5 units Q24H.  Thanks, Barnie Alderman, RN, MSN, CDE Diabetes Coordinator Inpatient Diabetes Program [phone] (Team Pager from 8am to 5pm)

## 2020-04-13 NOTE — Progress Notes (Addendum)
PROGRESS NOTE                                                                             PROGRESS NOTE                                                                                                                                                                                                             Patient Demographics:    Ann [name], is a 69 y.o. female, DOB - Sep 12, 1950, ZHY:[id number]  Outpatient Primary MD for the patient is Ann Ebbs, MD    LOS - 2  Admit date - 04/11/2020    Chief Complaint  Patient presents with  . Covid Positive       Brief Narrative    HPI: Ann [name] is a 69 y.o. female with history h/o stage IV CKD with associated anemia, CAD, T2DM, HTN, HLD, GERD, asthma, OSA on CPAP, chronic lumbar back pain who was recently admitted 12/11-12/17/21 to [facility] for COVID-19 related GI/resp symptoms treated with Remdesevir ( steroids not given as no hypoxia throughout the stay) presents to ED today with worsening symptoms. In the ED 12/11, she was felt to be dehydrated in addition to CKD baseline creatinine 2.7-3.3, creatinine was 5.3 on admission,did peak at 6.5,  plateaued around 6.2 range for 3 days prior to discharge. Diuretics and ACEiwere held on discharge and nephrology follow up arranged. She was deconditioned at discharge but declined SNF , went home with [facility].  Per daughter, patient has been eating poorly and sleeping most of the time since last hospital discharge.  She also appeared to be having labored breathing and unable to perform any of her ADLs.  Daughter asking if staff here can help her take a bath as she has been unable to get her to the bathroom.  Patient currently oriented to self/person only but not to place or time.  She is unable to provide any details regarding hospital visit but does report productive cough and shortness of breath.  ED findings  today: Afebrile, SBP 1 20-160, RR 15-19, hypoxic on arrival  requiring NRB M transiently-transitioned to 3 L O2 now.  Lab work shows worsening renal function with BUN 86->103, creatinine 6.26->8.99, bicarb 14, potassium 4.6, albumin 2.6->2.3.  CRP 20.6, procalcitonin 2.68, lactate 1.7.  Ferritin 1320, LDH 281, triglyceride 134, D-dimer 2.6->6.0 WBC 10.3, hemoglobin 9.0, platelet 321.  Chest x-ray shows "new mild bilateral upper lobe airspace disease, suspicious for viral pneumonia."  She received 10 mg IV Decadron x1 and on IV fluids NS at 100 mL/h.  Per EDP, nephrology has been consulted and patient requested to be admitted for further evaluation and management.    Subjective:    Ann [name] today remains confused, but she is more awake and alert this morning , she denies any complaints. ..     Assessment  & Plan :    Principal Problem:   Acute respiratory failure with hypoxia (HCC) Active Problems:   HYPERTENSION   CAD (coronary artery disease)   Obesity, Class III, BMI 40-49.9 (morbid obesity) ([place])   Acute encephalopathy   Chronic pain   2019 novel coronavirus disease (COVID-19)   GERD (gastroesophageal reflux disease)   AKI (acute kidney injury) ([place])  Acute Hypoxic Resp. Failure due to Acute Covid 19 Viral Pneumonitis during the ongoing 2020 Covid 19 Pandemic -  -She is unvaccinated -He was treated with steroids and Remdesivir during recent hospitalization, he is on steroids currently, no hypoxia, I will discontinue -Received Actemra on admission. -Was encouraged to use incentive spirometry and flutter valve.  Acute DVT -Elevated d D-dimers, venous Doppler was obtained she is having right peroneal veins DVT, given her Covid infection, and hypercoagulable state, high risk for VTE, will proceed with full anticoagulation with Eliquis.  Progressive AKI on CKD stage IV:  -Management per renal, started on hemodialysis this hospital stay, .  Acute metabolic encephalopathy: - Likely related to significant uremia (greater than 100) and  COVID-19 infection.  Nodule improving  Diabetes mellitus type 2:  -Continue with insulin sliding scale for now giving her encephalopathy .  Anemia -The setting of chronic kidney disease, she has 1 unit PRBC 12/20 with good response, hemoglobin this morning is 9.6.  CAD: Resume home medications, aspirin, beta-blockers and statins.  Hypertension:  -Resume home medications, will however reduce Norvasc to 5 mg and titrate as needed.  Diuretics/ACE inhibitors held in last admission due to problem #2.  Chronic pain: Hold Neurontin/Cymbalta for now until mental status improves.  GERD: Resume PPI unless otherwise advised by nephrology.  Obesity class III BMI 40-49.9: Follow-up PCP, weight modifying recommendations upon discharge    SpO2: 94 % O2 Flow Rate (L/min): 3 L/min  Recent Labs  Lab 04/07/20 0416 04/07/20 0549 04/08/20 0403 04/09/20 0407 04/11/20 1031 04/11/20 1032 04/11/20 1712 04/12/20 0416 04/13/20 0839  WBC  --    < > 7.0 7.8  --  10.3 10.7* 10.0 10.3  PLT  --    < > 198 220  --  321 286 306 266  CRP 7.8*  --  14.4*  --   --  20.6*  --   --   --   DDIMER 2.01*  --  1.86*  --   --  6.00*  --  7.15* 5.31*  PROCALCITON  --   --   --   --   --  2.68  --   --   --   AST  --   --   --   --   --  52*  --  37 27  ALT  --   --   --   --   --  34  --  29 24  ALKPHOS  --   --   --   --   --  73  --  70 67  BILITOT  --   --   --   --   --  0.9  --  0.6 0.8  ALBUMIN  --    < > 2.5* 2.5*  --  2.3*  --  2.2* 2.4*  LATICACIDVEN  --   --   --   --  1.7  --  1.1  --   --    < > = values in this interval not displayed.       ABG     Component Value Date/Time   TCO2 20 09/27/2008 1636            Condition - Extremely Guarded  Family Communication  : Disussed with daughter Ann [name] by phone 01/74/9449  Code Status :  Full  Consults  :  Renal, PCCM  Procedures  : A hemodialysis catheter placement by [facility] 12/19  Disposition Plan  :    Status is:  Inpatient  Remains inpatient appropriate because:Hemodynamically unstable   Dispo: The patient is from: Home              Anticipated d/c is to: SNF              Anticipated d/c date is: > 3 days              Patient currently is not medically stable to d/c.      DVT Prophylaxis  :  Heparin>>Eliquis  Lab Results  Component Value Date   PLT 266 04/13/2020    Diet :  Diet Order            Diet renal/carb modified with fluid restriction Diet-HS Snack? Nothing; Fluid restriction: 1200 mL Fluid; Room service appropriate? No; Fluid consistency: Thin  Diet effective now                  Inpatient Medications  Scheduled Meds: . sodium chloride   Intravenous Once  . vitamin C  500 mg Oral Daily  . aspirin EC  81 mg Oral Daily  . Chlorhexidine Gluconate Cloth  6 each Topical Q0600  . doxycycline  100 mg Oral Q12H  . heparin  7,500 Units Subcutaneous Q8H  . hydrALAZINE  50 mg Oral TID  . insulin aspart  0-6 Units Subcutaneous TID WC  . Ipratropium-Albuterol  1 puff Inhalation Q6H  . metoprolol tartrate  25 mg Oral BID  . nystatin   Topical BID  . pantoprazole  20 mg Oral Daily  . pravastatin  40 mg Oral QPM  . rOPINIRole  0.25 mg Oral QPM  . sodium bicarbonate  650 mg Oral BID   Continuous Infusions: . cefTRIAXone (ROCEPHIN)  IV 1 g (04/13/20 1301)   PRN Meds:.albuterol, bisacodyl, guaiFENesin-dextromethorphan, ondansetron **OR** ondansetron (ZOFRAN) IV  Antibiotics  :    Anti-infectives (From admission, onward)   Start     Dose/Rate Route Frequency Ordered Stop   04/11/20 1300  cefTRIAXone (ROCEPHIN) 1 g in sodium chloride 0.9 % 100 mL IVPB        1 g 200 mL/hr over 30 Minutes Intravenous Every 24 hours 04/11/20 1252     04/11/20 1300  doxycycline (VIBRA-TABS) tablet 100 mg  100 mg Oral Every 12 hours 04/11/20 1252          Phillips Climes M.D on 04/13/2020 at 2:03 PM  To page go to [email or web address]  [facility] -  Office   [phone]       Objective:   Vitals:   04/13/20 0422 04/13/20 0722 04/13/20 0826 04/13/20 1206  BP:  124/61 118/65 (!) 155/76  Pulse:  75 76 72  Resp:  19  19  Temp:      TempSrc:  Oral    SpO2:  98%  94%  Weight: 101.9 kg       Wt Readings from Last 3 Encounters:  04/13/20 101.9 kg  04/09/20 99 kg  02/25/19 115 kg     Intake/Output Summary (Last 24 hours) at 04/13/2020 1403 Last data filed at 04/13/2020 0900 Gross per 24 hour  Intake 130 ml  Output 500 ml  Net -370 ml     Physical Exam  She is more awake today, more interactive, but remains significantly confused and altered.  Symmetrical Chest wall movement, Good air movement bilaterally, CTAB RRR,No Gallops,Rubs or new Murmurs, No Parasternal Heave +ve B.Sounds, Abd Soft, No tenderness, No rebound - guarding or rigidity. No Cyanosis, Clubbing or edema, No new Rash or bruise      Data Review:    CBC Recent Labs  Lab 04/09/20 0407 04/11/20 1032 04/11/20 1712 04/12/20 0416 04/13/20 0839  WBC 7.8 10.3 10.7* 10.0 10.3  HGB 8.8* 9.0* 8.0* 7.7* 9.6*  HCT 27.9* 29.7* 26.2* 25.0* 30.0*  PLT 220 321 286 306 266  MCV 90.6 91.4 91.9 91.2 87.0  MCH 28.6 27.7 28.1 28.1 27.8  MCHC 31.5 30.3 30.5 30.8 32.0  RDW 15.5 16.2* 16.3* 16.2* 15.5  LYMPHSABS  --  1.0  --  0.6* 0.5*  MONOABS  --  0.8  --  0.2 0.3  EOSABS  --  0.1  --  0.0 0.0  BASOSABS  --  0.0  --  0.0 0.0    Recent Labs  Lab 04/07/20 0416 04/07/20 0549 04/08/20 0403 04/09/20 0407 04/11/20 1031 04/11/20 1032 04/11/20 1712 04/12/20 0416 04/13/20 0839  NA  --    < > 141 142  --  141  --  142 142  K  --    < > 3.6 3.9  --  4.6  --  5.0 4.4  CL  --    < > 111 110  --  109  --  110 104  CO2  --    < > 16* 16*  --  14*  --  15* 18*  GLUCOSE  --    < > 88 121*  --  203*  --  212* 274*  BUN  --    < > 86* 86*  --  103*  --  111* 80*  CREATININE  --    < > 6.20* 6.26*  --  8.99*  --  9.47* 6.80*  CALCIUM  --    < > 7.7* 8.0*  --  8.5*  --   8.7* 8.7*  AST  --   --   --   --   --  52*  --  37 27  ALT  --   --   --   --   --  34  --  29 24  ALKPHOS  --   --   --   --   --  73  --  70 67  BILITOT  --   --   --   --   --  0.9  --  0.6 0.8  ALBUMIN  --    < > 2.5* 2.5*  --  2.3*  --  2.2* 2.4*  MG  --   --   --   --   --   --   --  2.4  --   CRP 7.8*  --  14.4*  --   --  20.6*  --   --   --   DDIMER 2.01*  --  1.86*  --   --  6.00*  --  7.15* 5.31*  PROCALCITON  --   --   --   --   --  2.68  --   --   --   LATICACIDVEN  --   --   --   --  1.7  --  1.1  --   --    < > = values in this interval not displayed.    ------------------------------------------------------------------------------------------------------------------ Recent Labs    04/11/20 1032  TRIG 134    Lab Results  Component Value Date   HGBA1C 6.6 (H) 04/04/2020   ------------------------------------------------------------------------------------------------------------------ No results for input(s): TSH, T4TOTAL, T3FREE, THYROIDAB in the last 72 hours.  Invalid input(s): FREET3  Cardiac Enzymes No results for input(s): CKMB, TROPONINI, MYOGLOBIN in the last 168 hours.  Invalid input(s): CK ------------------------------------------------------------------------------------------------------------------ No results found for: BNP  Micro Results Recent Results (from the past 240 hour(s))  Resp Panel by RT-PCR (Flu A&B, Covid) Nasopharyngeal Swab     Status: Abnormal   Collection Time: 04/03/20  2:56 PM   Specimen: Nasopharyngeal Swab; Nasopharyngeal(NP) swabs in vial transport medium  Result Value Ref Range Status   SARS Coronavirus 2 by RT PCR POSITIVE (A) NEGATIVE Final    Comment: RESULT CALLED TO, READ BACK BY AND VERIFIED WITH: DR RAY @ 2005 04/03/20 [facility] (NOTE) SARS-CoV-2 target nucleic acids are DETECTED.  The SARS-CoV-2 RNA is generally detectable in upper respiratory specimens during the acute phase of infection. Positive results  are indicative of the presence of the identified virus, but do not rule out bacterial infection or co-infection with other pathogens not detected by the test. Clinical correlation with patient history and other diagnostic information is necessary to determine patient infection status. The expected result is Negative.  Fact Sheet for Patients: EntrepreneurPulse.com.au  Fact Sheet for Healthcare Providers: IncredibleEmployment.be  This test is not yet approved or cleared by the [place] FDA and  has been authorized for detection and/or diagnosis of SARS-CoV-2 by FDA under an Emergency Use Authorization (EUA).  This EUA will remain in effect (meaning this test can be used)  for the duration of  the COVID-19 declaration under Section 564(b)(1) of the Act, 21 U.S.C. section 360bbb-3(b)(1), unless the authorization is terminated or revoked sooner.     Influenza A by PCR NEGATIVE NEGATIVE Final   Influenza B by PCR NEGATIVE NEGATIVE Final    Comment: (NOTE) The Xpert Xpress SARS-CoV-2/FLU/RSV plus assay is intended as an aid in the diagnosis of influenza from Nasopharyngeal swab specimens and should not be used as a sole basis for treatment. Nasal washings and aspirates are unacceptable for Xpert Xpress SARS-CoV-2/FLU/RSV testing.  Fact Sheet for Patients: EntrepreneurPulse.com.au  Fact Sheet for Healthcare Providers: IncredibleEmployment.be  This test is not yet approved or cleared by the [place] FDA and has been authorized for detection and/or diagnosis of SARS-CoV-2 by FDA under an Emergency Use  Authorization (EUA). This EUA will remain in effect (meaning this test can be used) for the duration of the COVID-19 declaration under Section 564(b)(1) of the Act, 21 U.S.C. section 360bbb-3(b)(1), unless the authorization is terminated or revoked.  Performed at [facility], [place]  [address], [place], [place] [address]   Blood culture (routine x 2)     Status: None   Collection Time: 04/03/20  3:00 PM   Specimen: BLOOD  Result Value Ref Range Status   Specimen Description   Final    BLOOD RIGHT ANTECUBITAL Performed at [facility] [address] [place], [place] [address]    Special Requests   Final    BOTTLES DRAWN AEROBIC AND ANAEROBIC Blood Culture adequate volume Performed at [facility] [address], [place], [place] [address]    Culture   Final    NO GROWTH 6 DAYS Performed at [facility], [place] [address] [place], [place] [address]    Report Status 04/09/2020 FINAL  Final  Blood culture (routine x 2)     Status: None   Collection Time: 04/03/20  3:41 PM   Specimen: BLOOD  Result Value Ref Range Status   Specimen Description   Final    BLOOD BLOOD LEFT HAND Performed at [facility] [address], [place], [place] [address]    Special Requests   Final    BOTTLES DRAWN AEROBIC AND ANAEROBIC Blood Culture adequate volume Performed at [facility] [address] [place], [place] [address]    Culture   Final    NO GROWTH 6 DAYS Performed at [facility], [place] [address] [place], [place] [address]    Report Status 04/09/2020 FINAL  Final  Blood Culture (routine x 2)     Status: None (Preliminary result)   Collection Time: 04/11/20 10:18 AM   Specimen: BLOOD  Result Value Ref Range Status   Specimen Description BLOOD RIGHT ANTECUBITAL  Final   Special Requests   Final    BOTTLES DRAWN AEROBIC ONLY Blood Culture adequate volume   Culture   Final    NO GROWTH 2 DAYS Performed at [facility], [place] [address], [place], [place] [address]    Report Status PENDING  Incomplete  Blood Culture (routine x 2)     Status: None (Preliminary result)   Collection Time: 04/11/20 10:23 AM   Specimen: BLOOD  Result Value Ref Range Status   Specimen  Description BLOOD LEFT ANTECUBITAL  Final   Special Requests   Final    BOTTLES DRAWN AEROBIC AND ANAEROBIC Blood Culture adequate volume   Culture   Final    NO GROWTH 2 DAYS Performed at [facility], [place] [address] [place], [place] [address]    Report Status PENDING  Incomplete  MRSA PCR Screening     Status: None   Collection Time: 04/11/20 12:56 PM   Specimen: Nasal Mucosa; Nasopharyngeal  Result Value Ref Range Status   MRSA by PCR NEGATIVE NEGATIVE Final    Comment:        The GeneXpert MRSA Assay (FDA approved for NASAL specimens only), is one component of a comprehensive MRSA colonization surveillance program. It is not intended to diagnose MRSA infection nor to guide or monitor treatment for MRSA infections. Performed at [facility], [place] [address] [place], [place] [address]     Radiology Reports CT ABDOMEN PELVIS WO CONTRAST  Result Date: 04/03/2020 CLINICAL DATA:  Abdominal pain. EXAM: CT ABDOMEN AND PELVIS WITHOUT CONTRAST TECHNIQUE: Multidetector CT imaging of the abdomen and pelvis was performed following the standard protocol without IV contrast. COMPARISON:  02/20/2019 FINDINGS: Lower chest: Patchy ground-glass infiltrates in the right lower lobe could be due to developing pneumonia, atypical pneumonia or acute aspiration. No pleural effusions. The heart is normal in size. No pericardial effusion. Three-vessel coronary artery calcifications are noted. Small hiatal hernia. Hepatobiliary: No hepatic lesions are identified without contrast. No intrahepatic biliary dilatation. The gallbladder is surgically absent. No common bile duct dilatation. Pancreas: Marked fatty atrophy of the pancreas. No mass, inflammation or ductal dilatation. Spleen: Normal size.  No focal lesions. Adrenals/Urinary Tract: The adrenal glands are unremarkable. Small, chronically atrophied kidneys consistent with chronic renal failure. No worrisome renal lesions or  hydroureteronephrosis. The bladder is unremarkable. Stomach/Bowel: The stomach, duodenum, small bowel and colon are unremarkable. No acute inflammatory changes, mass lesions or obstructive findings. The terminal ileum is normal. Scattered colonic diverticulosis but no findings for acute diverticulitis. Vascular/Lymphatic: Significant diffuse vascular calcifications. No aneurysm. No mesenteric or retroperitoneal mass or adenopathy. No free air or free fluid. No leaking oral contrast. Reproductive: The uterus is surgically absent. Both ovaries are still present and appear normal. Other: Moderate-sized periumbilical abdominal wall hernia containing fat. Musculoskeletal: No significant bony findings. IMPRESSION: 1. No acute abdominal/pelvic findings, mass lesions or adenopathy. 2. Patchy ground-glass infiltrates in the right lower lobe could be due to developing pneumonia, atypical pneumonia or acute aspiration. 3. Status post cholecystectomy. No biliary dilatation. 4. Small, chronically atrophied kidneys consistent with chronic renal failure. 5. Moderate-sized periumbilical abdominal wall hernia containing fat. 6. Aortic atherosclerosis. Aortic Atherosclerosis (ICD10-I70.0). Electronically Signed   By: Marijo Sanes M.D.   On: 04/03/2020 19:19   CT Head Wo Contrast  Result Date: 04/03/2020 CLINICAL DATA:  Sudden onset of leg weakness today. EXAM: CT HEAD WITHOUT CONTRAST TECHNIQUE: Contiguous axial images were obtained from the base of the skull through the vertex without intravenous contrast. COMPARISON:  None. FINDINGS: Brain: Age advanced cerebral atrophy, ventriculomegaly and mild periventricular white matter disease. No extra-axial fluid collections are identified. No CT findings for acute hemispheric infarction or intracranial hemorrhage. No mass lesions. The brainstem and cerebellum are normal. Vascular: Moderate to advanced vascular calcifications but no aneurysm or hyperdense vessels. Skull: No skull  fracture or bone lesions. Sinuses/Orbits: The paranasal sinuses and mastoid air cells are clear except for a 2 cm mucous retention cyst or polyp in the left maxillary sinus. The globes are intact. Other: No scalp lesions or scalp hematoma. IMPRESSION: 1. Age advanced cerebral atrophy, ventriculomegaly and mild periventricular white matter disease. 2. No acute intracranial findings or mass lesions. Electronically Signed   By: Marijo Sanes M.D.   On: 04/03/2020 19:14   US RENAL  Result Date: 04/04/2020 CLINICAL DATA:  Acute kidney injury EXAM: RENAL / URINARY TRACT ULTRASOUND COMPLETE COMPARISON:  CT 04/03/2020 FINDINGS: Technical note: Significantly limited examination secondary to poor penetration related to patient body habitus and shadowing from overlying bowel gas. Right Kidney: Limited. Echogenic renal parenchyma with cortical thinning compatible with chronic medical renal disease. Right renal length of approximately 10.6 cm. No shadowing stone or hydronephrosis is identified. Left Kidney: Very limited evaluation. Visualized left kidney appears diffusely echogenic with renal cortical thinning compatible with chronic medical renal disease. No obvious hydronephrosis. Bladder: Appears normal for degree of bladder distention. Other: None. IMPRESSION: 1. Limited exam. 2. No sonographic evidence of obstructive  uropathy. 3. Atrophic, echogenic kidneys compatible with chronic medical renal disease. Electronically Signed   By: Davina Poke D.O.   On: 04/04/2020 12:18   DG CHEST PORT 1 VIEW  Result Date: 04/11/2020 CLINICAL DATA:  Hemodialysis catheter placement EXAM: PORTABLE CHEST 1 VIEW COMPARISON:  April 11, 2020 FINDINGS: The heart size is enlarged. Bilateral airspace disease is again noted and has progressed in the right mid and right upper lung zones. There is vascular congestion with small bilateral pleural effusions. There is a right-sided central venous catheter with tip projecting over the mid  SVC. The catheter fall short of the cavoatrial junction by approximately 6-7 cm. There is no evidence for pneumothorax. Aortic calcifications are noted. The main pulmonary artery is dilated. IMPRESSION: 1. Interval worsening of bilateral airspace disease, greatest in the right mid and right upper lung zones. 2. Hemodialysis catheter as above. While the tip likely terminates in the SVC, a catheter fall short of the cavoatrial junction by at least 6 cm. 3. Persistent cardiomegaly with vascular congestion and small bilateral pleural effusions. Electronically Signed   By: Constance Holster M.D.   On: 04/11/2020 17:23   DG Chest Port 1 View  Result Date: 04/11/2020 CLINICAL DATA:  Shortness of breath. Cough. Hypoxia. COVID-19 virus infection. EXAM: PORTABLE CHEST 1 VIEW COMPARISON:  04/03/2020 FINDINGS: Heart size remains normal. New mild airspace opacity is seen in the central right upper lobe and peripheral left upper lobe. No evidence of pleural effusion. IMPRESSION: New mild bilateral upper lobe airspace disease, suspicious for viral pneumonia. Electronically Signed   By: Marlaine Hind M.D.   On: 04/11/2020 11:30   DG Chest Port 1 View  Result Date: 04/03/2020 CLINICAL DATA:  Right lower lobe infiltrate EXAM: PORTABLE CHEST 1 VIEW COMPARISON:  Same day CT abdomen pelvis, 04/03/2020, chest radiographs, 04/05/2014 FINDINGS: The heart size and mediastinal contours are within normal limits. Subtle heterogeneous airspace opacity of the right lung base. The visualized skeletal structures are unremarkable. IMPRESSION: Subtle heterogeneous airspace opacity of the right lung base, in keeping with findings of prior CT and consistent with infection or aspiration. Electronically Signed   By: Eddie Candle M.D.   On: 04/03/2020 20:23   ECHOCARDIOGRAM COMPLETE  Result Date: 04/04/2020    ECHOCARDIOGRAM REPORT   Patient Name:   ILAMAE GENG Vessels Date of Exam: 04/04/2020 Medical Rec #:  [id number]     Height:       62.0 in  Accession #:    [id number]    Weight:       211.2 lb Date of Birth:  Sep 09, 1950     BSA:          1.956 m Patient Age:    72 years      BP:           146/78 mmHg Patient Gender: F             HR:           82 bpm. Exam Location:  Inpatient Procedure: 2D Echo, Cardiac Doppler, Color Doppler and Intracardiac            Opacification Agent Indications:    Abnormal ECG 794.31 / R94.31  History:        Patient has prior history of Echocardiogram examinations, most                 recent 11/11/1996. CAD, Signs/Symptoms:Shortness of Breath and  Chest Pain; Risk Factors:Hypertension, Diabetes and                 Dyslipidemia.  Sonographer:    Bernadene Person RDCS Referring Phys: [id number] [place]  1. Left ventricular ejection fraction, by estimation, is 60 to 65%. The left ventricle has normal function. The left ventricle has no regional wall motion abnormalities. Left ventricular diastolic parameters are consistent with Grade II diastolic dysfunction (pseudonormalization). Elevated left ventricular end-diastolic pressure.  2. Right ventricular systolic function is normal. The right ventricular size is normal. There is normal pulmonary artery systolic pressure.  3. The mitral valve is degenerative. Trivial mitral valve regurgitation. No evidence of mitral stenosis. Moderate mitral annular calcification.  4. The aortic valve is tricuspid. Aortic valve regurgitation is not visualized. Mild to moderate aortic valve sclerosis/calcification is present, without any evidence of aortic stenosis.  5. The inferior vena cava is normal in size with greater than 50% respiratory variability, suggesting right atrial pressure of 3 mmHg. FINDINGS  Left Ventricle: Left ventricular ejection fraction, by estimation, is 60 to 65%. The left ventricle has normal function. The left ventricle has no regional wall motion abnormalities. Definity contrast agent was given IV to delineate the left ventricular   endocardial borders. The left ventricular internal cavity size was normal in size. There is no left ventricular hypertrophy. Left ventricular diastolic parameters are consistent with Grade II diastolic dysfunction (pseudonormalization). Elevated left ventricular end-diastolic pressure. Right Ventricle: The right ventricular size is normal. No increase in right ventricular wall thickness. Right ventricular systolic function is normal. There is normal pulmonary artery systolic pressure. The tricuspid regurgitant velocity is 2.75 m/s, and  with an assumed right atrial pressure of 3 mmHg, the estimated right ventricular systolic pressure is 25.3 mmHg. Left Atrium: Left atrial size was normal in size. Right Atrium: Right atrial size was normal in size. Pericardium: There is no evidence of pericardial effusion. Mitral Valve: The mitral valve is degenerative in appearance. There is moderate thickening of the mitral valve leaflet(s). There is moderate calcification of the mitral valve leaflet(s). Moderate mitral annular calcification. Trivial mitral valve regurgitation. No evidence of mitral valve stenosis. Tricuspid Valve: The tricuspid valve is normal in structure. Tricuspid valve regurgitation is mild . No evidence of tricuspid stenosis. Aortic Valve: The aortic valve is tricuspid. Aortic valve regurgitation is not visualized. Mild to moderate aortic valve sclerosis/calcification is present, without any evidence of aortic stenosis. Pulmonic Valve: The pulmonic valve was normal in structure. Pulmonic valve regurgitation is not visualized. No evidence of pulmonic stenosis. Aorta: The aortic root is normal in size and structure. Venous: The inferior vena cava is normal in size with greater than 50% respiratory variability, suggesting right atrial pressure of 3 mmHg. IAS/Shunts: No atrial level shunt detected by color flow Doppler.  LEFT VENTRICLE PLAX 2D LVIDd:         5.80 cm  Diastology LVIDs:         4.10 cm  LV e'  medial:    3.81 cm/s LV PW:         0.70 cm  LV E/e' medial:  24.3 LV IVS:        1.00 cm  LV e' lateral:   4.57 cm/s LVOT diam:     2.00 cm  LV E/e' lateral: 20.2 LV SV:         78 LV SV Index:   40 LVOT Area:     3.14 cm  RIGHT VENTRICLE RV S  prime:     13.10 cm/s TAPSE (M-mode): 2.0 cm LEFT ATRIUM             Index       RIGHT ATRIUM           Index LA diam:        3.40 cm 1.74 cm/m  RA Area:     18.10 cm LA Vol (A2C):   56.0 ml 28.62 ml/m RA Volume:   47.40 ml  24.23 ml/m LA Vol (A4C):   51.1 ml 26.12 ml/m LA Biplane Vol: 53.4 ml 27.29 ml/m  AORTIC VALVE LVOT Vmax:   104.00 cm/s LVOT Vmean:  86.300 cm/s LVOT VTI:    0.249 m  AORTA Ao Root diam: 3.30 cm Ao Asc diam:  3.60 cm MITRAL VALVE                TRICUSPID VALVE MV Area (PHT): 2.48 cm     TR Peak grad:   30.2 mmHg MV Decel Time: 306 msec     TR Vmax:        275.00 cm/s MV E velocity: 92.40 cm/s MV A velocity: 138.00 cm/s  SHUNTS MV E/A ratio:  0.67         Systemic VTI:  0.25 m                             Systemic Diam: 2.00 cm Jenkins Rouge MD Electronically signed by Jenkins Rouge MD Signature Date/Time: 04/04/2020/11:23:04 AM    Final    VAS [place] LOWER EXTREMITY VENOUS (DVT)  Result Date: 04/13/2020  Lower Venous DVT Study Indications: Covid +, elevated D dimer.  Limitations: Body habitus, cooperation and poor ultrasound/tissue interface. Performing Technologist: Antonieta Pert RDMS, RVT  Examination Guidelines: A complete evaluation includes B-mode imaging, spectral Doppler, color Doppler, and power Doppler as needed of all accessible portions of each vessel. Bilateral testing is considered an integral part of a complete examination. Limited examinations for reoccurring indications may be performed as noted. The reflux portion of the exam is performed with the patient in reverse Trendelenburg.  +---------+---------------+---------+-----------+----------+--------------+ RIGHT    CompressibilityPhasicitySpontaneityPropertiesThrombus Aging  +---------+---------------+---------+-----------+----------+--------------+ CFV      Full           Yes      Yes                                 +---------+---------------+---------+-----------+----------+--------------+ SFJ      Full                                                        +---------+---------------+---------+-----------+----------+--------------+ FV Prox  Full                                                        +---------+---------------+---------+-----------+----------+--------------+ FV Mid   Full                                                        +---------+---------------+---------+-----------+----------+--------------+  FV DistalFull                                                        +---------+---------------+---------+-----------+----------+--------------+ PFV      Full                                                        +---------+---------------+---------+-----------+----------+--------------+ POP      Full           Yes      Yes                                 +---------+---------------+---------+-----------+----------+--------------+ PTV      Full                                                        +---------+---------------+---------+-----------+----------+--------------+ PERO     Partial                            dilated   Acute          +---------+---------------+---------+-----------+----------+--------------+ GSV      Full                                                        +---------+---------------+---------+-----------+----------+--------------+   +---------+---------------+---------+-----------+----------+--------------+ LEFT     CompressibilityPhasicitySpontaneityPropertiesThrombus Aging +---------+---------------+---------+-----------+----------+--------------+ CFV      Full           Yes      Yes                                  +---------+---------------+---------+-----------+----------+--------------+ SFJ      Full                                                        +---------+---------------+---------+-----------+----------+--------------+ FV Prox  Full                                                        +---------+---------------+---------+-----------+----------+--------------+ FV Mid   Full                                                        +---------+---------------+---------+-----------+----------+--------------+  FV DistalFull                                                        +---------+---------------+---------+-----------+----------+--------------+ PFV      Full                                                        +---------+---------------+---------+-----------+----------+--------------+ POP      Full           Yes      Yes                                 +---------+---------------+---------+-----------+----------+--------------+ PTV      Full                                                        +---------+---------------+---------+-----------+----------+--------------+ PERO     Full                                                        +---------+---------------+---------+-----------+----------+--------------+ GSV      Full                                                        +---------+---------------+---------+-----------+----------+--------------+     Summary: RIGHT: - Findings consistent with acute deep vein thrombosis involving the right peroneal veins. - No cystic structure found in the popliteal fossa. - Small segment of thrombus in the right calf.  LEFT: - There is no evidence of deep vein thrombosis in the lower extremity. However, portions of this examination were limited- see technologist comments above.  - No cystic structure found in the popliteal fossa. - Difficult exam bilaterally due to large body habitus, poor penetration and  poor patient cooperation.  *See table(s) above for measurements and observations.    Preliminary

## 2020-04-13 NOTE — Progress Notes (Signed)
Bilateral lower extremity venous duplex complete.  Critical findings reported to Dr. Waldron Labs.  Please see CV Proc tab for preliminary results. [place], RVT 4:59 PM  04/13/2020

## 2020-04-13 NOTE — Progress Notes (Signed)
Patient ID: LAIYLA SLAGEL, female   DOB: 27-Jun-1950, 69 y.o.   MRN: [id number] S: Feels better today.  No SOB, N/V/D O:BP (!) 155/76 (BP Location: Right Arm)   Pulse 72   Temp 98.1 F (36.7 C) (Oral)   Resp 19   Wt 101.9 kg   SpO2 94%   BMI 41.09 kg/m   Intake/Output Summary (Last 24 hours) at 04/13/2020 1420 Last data filed at 04/13/2020 0900 Gross per 24 hour  Intake 130 ml  Output 500 ml  Net -370 ml   Intake/Output: I/O last 3 completed shifts: In: 110 [I.V.:10; IV Piggyback:100] Out: 2500 [Urine:500; Other:2000]  Intake/Output this shift:  Total I/O In: 20 [P.O.:20] Out: -  Weight change:  Gen: NAD CVS: RRR Resp: CTA Abd: +BS, soft, NT/ND Ext: no edema  Recent Labs  Lab 04/07/20 0549 04/08/20 0403 04/09/20 0407 04/11/20 1032 04/12/20 0416 04/13/20 0839  NA 143 141 142 141 142 142  K 3.6 3.6 3.9 4.6 5.0 4.4  CL 109 111 110 109 110 104  CO2 17* 16* 16* 14* 15* 18*  GLUCOSE 74 88 121* 203* 212* 274*  BUN 92* 86* 86* 103* 111* 80*  CREATININE 6.46* 6.20* 6.26* 8.99* 9.47* 6.80*  ALBUMIN 2.6* 2.5* 2.5* 2.3* 2.2* 2.4*  CALCIUM 7.9* 7.7* 8.0* 8.5* 8.7* 8.7*  PHOS 6.4* 5.3* 5.5*  --  6.6*  --   AST  --   --   --  52* 37 27  ALT  --   --   --  34 29 24   Liver Function Tests: Recent Labs  Lab 04/11/20 1032 04/12/20 0416 04/13/20 0839  AST 52* 37 27  ALT 34 29 24  ALKPHOS 73 70 67  BILITOT 0.9 0.6 0.8  PROT 5.8* 5.6* 5.8*  ALBUMIN 2.3* 2.2* 2.4*   No results for input(s): LIPASE, AMYLASE in the last 168 hours. No results for input(s): AMMONIA in the last 168 hours. CBC: Recent Labs  Lab 04/09/20 0407 04/11/20 1032 04/11/20 1712 04/12/20 0416 04/13/20 0839  WBC 7.8 10.3 10.7* 10.0 10.3  NEUTROABS  --  8.0*  --  8.6* 9.2*  HGB 8.8* 9.0* 8.0* 7.7* 9.6*  HCT 27.9* 29.7* 26.2* 25.0* 30.0*  MCV 90.6 91.4 91.9 91.2 87.0  PLT 220 321 286 306 266   Cardiac Enzymes: No results for input(s): CKTOTAL, CKMB, CKMBINDEX, TROPONINI in the last 168  hours. CBG: Recent Labs  Lab 04/12/20 1223 04/12/20 1659 04/12/20 2101 04/13/20 0725 04/13/20 1209  GLUCAP 151* 160* 183* 236* 251*    Iron Studies:  Recent Labs    04/12/20 0416  FERRITIN 819*   Studies/Results: DG CHEST PORT 1 VIEW  Result Date: 04/11/2020 CLINICAL DATA:  Hemodialysis catheter placement EXAM: PORTABLE CHEST 1 VIEW COMPARISON:  April 11, 2020 FINDINGS: The heart size is enlarged. Bilateral airspace disease is again noted and has progressed in the right mid and right upper lung zones. There is vascular congestion with small bilateral pleural effusions. There is a right-sided central venous catheter with tip projecting over the mid SVC. The catheter fall short of the cavoatrial junction by approximately 6-7 cm. There is no evidence for pneumothorax. Aortic calcifications are noted. The main pulmonary artery is dilated. IMPRESSION: 1. Interval worsening of bilateral airspace disease, greatest in the right mid and right upper lung zones. 2. Hemodialysis catheter as above. While the tip likely terminates in the SVC, a catheter fall short of the cavoatrial junction by at least 6 cm.  3. Persistent cardiomegaly with vascular congestion and small bilateral pleural effusions. Electronically Signed   By: Constance Holster M.D.   On: 04/11/2020 17:23   VAS [place] LOWER EXTREMITY VENOUS (DVT)  Result Date: 04/13/2020  Lower Venous DVT Study Indications: Covid +, elevated D dimer.  Limitations: Body habitus, cooperation and poor ultrasound/tissue interface. Performing Technologist: Antonieta Pert RDMS, RVT  Examination Guidelines: A complete evaluation includes B-mode imaging, spectral Doppler, color Doppler, and power Doppler as needed of all accessible portions of each vessel. Bilateral testing is considered an integral part of a complete examination. Limited examinations for reoccurring indications may be performed as noted. The reflux portion of the exam is performed with the  patient in reverse Trendelenburg.  +---------+---------------+---------+-----------+----------+--------------+ RIGHT    CompressibilityPhasicitySpontaneityPropertiesThrombus Aging +---------+---------------+---------+-----------+----------+--------------+ CFV      Full           Yes      Yes                                 +---------+---------------+---------+-----------+----------+--------------+ SFJ      Full                                                        +---------+---------------+---------+-----------+----------+--------------+ FV Prox  Full                                                        +---------+---------------+---------+-----------+----------+--------------+ FV Mid   Full                                                        +---------+---------------+---------+-----------+----------+--------------+ FV DistalFull                                                        +---------+---------------+---------+-----------+----------+--------------+ PFV      Full                                                        +---------+---------------+---------+-----------+----------+--------------+ POP      Full           Yes      Yes                                 +---------+---------------+---------+-----------+----------+--------------+ PTV      Full                                                        +---------+---------------+---------+-----------+----------+--------------+  PERO     Partial                            dilated   Acute          +---------+---------------+---------+-----------+----------+--------------+ GSV      Full                                                        +---------+---------------+---------+-----------+----------+--------------+   +---------+---------------+---------+-----------+----------+--------------+ LEFT     CompressibilityPhasicitySpontaneityPropertiesThrombus Aging  +---------+---------------+---------+-----------+----------+--------------+ CFV      Full           Yes      Yes                                 +---------+---------------+---------+-----------+----------+--------------+ SFJ      Full                                                        +---------+---------------+---------+-----------+----------+--------------+ FV Prox  Full                                                        +---------+---------------+---------+-----------+----------+--------------+ FV Mid   Full                                                        +---------+---------------+---------+-----------+----------+--------------+ FV DistalFull                                                        +---------+---------------+---------+-----------+----------+--------------+ PFV      Full                                                        +---------+---------------+---------+-----------+----------+--------------+ POP      Full           Yes      Yes                                 +---------+---------------+---------+-----------+----------+--------------+ PTV      Full                                                        +---------+---------------+---------+-----------+----------+--------------+  PERO     Full                                                        +---------+---------------+---------+-----------+----------+--------------+ GSV      Full                                                        +---------+---------------+---------+-----------+----------+--------------+     Summary: RIGHT: - Findings consistent with acute deep vein thrombosis involving the right peroneal veins. - No cystic structure found in the popliteal fossa. - Small segment of thrombus in the right calf.  LEFT: - There is no evidence of deep vein thrombosis in the lower extremity. However, portions of this examination were limited- see  technologist comments above.  - No cystic structure found in the popliteal fossa. - Difficult exam bilaterally due to large body habitus, poor penetration and poor patient cooperation.  *See table(s) above for measurements and observations.    Preliminary    . sodium chloride   Intravenous Once  . apixaban  10 mg Oral BID   Followed by  . [START ON 04/20/2020] apixaban  5 mg Oral BID  . vitamin C  500 mg Oral Daily  . Chlorhexidine Gluconate Cloth  6 each Topical Q0600  . hydrALAZINE  50 mg Oral TID  . insulin aspart  0-6 Units Subcutaneous TID WC  . Ipratropium-Albuterol  1 puff Inhalation Q6H  . metoprolol tartrate  25 mg Oral BID  . nystatin   Topical BID  . pantoprazole  20 mg Oral Daily  . pravastatin  40 mg Oral QPM  . rOPINIRole  0.25 mg Oral QPM  . sodium bicarbonate  650 mg Oral BID    BMET    Component Value Date/Time   NA 142 04/13/2020 0839   K 4.4 04/13/2020 0839   CL 104 04/13/2020 0839   CO2 18 (L) 04/13/2020 0839   GLUCOSE 274 (H) 04/13/2020 0839   BUN 80 (H) 04/13/2020 0839   CREATININE 6.80 (H) 04/13/2020 0839   CREATININE 1.53 (H) 09/28/2011 0945   CALCIUM 8.7 (L) 04/13/2020 0839   CALCIUM 9.6 11/01/2009 1543   GFRNONAA 6 (L) 04/13/2020 0839   GFRNONAA 36 (L) 09/28/2011 0945   GFRAA 21 (L) 02/27/2019 0548   GFRAA 42 (L) 09/28/2011 0945   CBC    Component Value Date/Time   WBC 10.3 04/13/2020 0839   RBC 3.45 (L) 04/13/2020 0839   HGB 9.6 (L) 04/13/2020 0839   HCT 30.0 (L) 04/13/2020 0839   HCT 31.2 (L) 02/21/2019 0018   PLT 266 04/13/2020 0839   MCV 87.0 04/13/2020 0839   MCH 27.8 04/13/2020 0839   MCHC 32.0 04/13/2020 0839   RDW 15.5 04/13/2020 0839   LYMPHSABS 0.5 (L) 04/13/2020 0839   MONOABS 0.3 04/13/2020 0839   EOSABS 0.0 04/13/2020 0839   BASOSABS 0.0 04/13/2020 0839    Assessment and plan: 1. AKI/CKD stage IV- (baseline Scr 2.7-3.3) rising bun/creat in setting of covid-19 infection.  S/p HD 04/11/20 due to AMS and had second  treatement 04/12/20.   1. She is more awake and alert today 2. No  uremic symptoms 3. Will hold HD and follow UOP and BUN/Cr and decide upon another session of HD tomorrow morning. 2. covid-19 infection- per primary team. 3. HTN/Volume- UF with HD and follow 4. CAD- stable 5. Anemia of CKD stage IV- low Hgb at 7.7.  Start ESA and follow H/H and iron stores.  Transfuse prn. 6. Vascular access- RIJ temp HD catheter placed 04/11/20.  Had been referred to [facility] for access placement.  Will likely need tunneled HD catheter if no significant improvement of UOP and Scr.   Donetta Potts, MD [employer] [phone]

## 2020-04-13 NOTE — Progress Notes (Signed)
Pharmacy - Eliquis   New DVT in the setting of Covid  Plan: Eliquis 10 mg po BID x 7 days then 5 mg po BID  Thank you Anette Guarneri, PharmD

## 2020-04-14 ENCOUNTER — Inpatient Hospital Stay (HOSPITAL_COMMUNITY): Payer: Medicare Other

## 2020-04-14 ENCOUNTER — Encounter: Payer: Medicare Other | Admitting: Vascular Surgery

## 2020-04-14 ENCOUNTER — Other Ambulatory Visit (HOSPITAL_COMMUNITY): Payer: Medicare Other

## 2020-04-14 ENCOUNTER — Encounter (HOSPITAL_COMMUNITY): Payer: Medicare Other

## 2020-04-14 LAB — COMPREHENSIVE METABOLIC PANEL
ALT: 22 U/L (ref 0–44)
AST: 21 U/L (ref 15–41)
Albumin: 2.5 g/dL — ABNORMAL LOW (ref 3.5–5.0)
Alkaline Phosphatase: 70 U/L (ref 38–126)
Anion gap: 16 — ABNORMAL HIGH (ref 5–15)
BUN: 91 mg/dL — ABNORMAL HIGH (ref 8–23)
CO2: 19 mmol/L — ABNORMAL LOW (ref 22–32)
Calcium: 8.4 mg/dL — ABNORMAL LOW (ref 8.9–10.3)
Chloride: 105 mmol/L (ref 98–111)
Creatinine, Ser: 6.78 mg/dL — ABNORMAL HIGH (ref 0.44–1.00)
GFR, Estimated: 6 mL/min — ABNORMAL LOW (ref >60)
Glucose, Bld: 397 mg/dL — ABNORMAL HIGH (ref 70–99)
Potassium: 4.4 mmol/L (ref 3.5–5.1)
Sodium: 140 mmol/L (ref 135–145)
Total Bilirubin: 0.6 mg/dL (ref 0.3–1.2)
Total Protein: 5.7 g/dL — ABNORMAL LOW (ref 6.5–8.1)

## 2020-04-14 LAB — CBC WITH DIFFERENTIAL/PLATELET
Abs Immature Granulocytes: 0.25 10*3/uL — ABNORMAL HIGH (ref 0.00–0.07)
Basophils Absolute: 0 10*3/uL (ref 0.0–0.1)
Basophils Relative: 0 %
Eosinophils Absolute: 0 10*3/uL (ref 0.0–0.5)
Eosinophils Relative: 0 %
HCT: 32.5 % — ABNORMAL LOW (ref 36.0–46.0)
Hemoglobin: 10.7 g/dL — ABNORMAL LOW (ref 12.0–15.0)
Immature Granulocytes: 3 %
Lymphocytes Relative: 4 %
Lymphs Abs: 0.4 10*3/uL — ABNORMAL LOW (ref 0.7–4.0)
MCH: 29.2 pg (ref 26.0–34.0)
MCHC: 32.9 g/dL (ref 30.0–36.0)
MCV: 88.6 fL (ref 80.0–100.0)
Monocytes Absolute: 0.3 10*3/uL (ref 0.1–1.0)
Monocytes Relative: 3 %
Neutro Abs: 8.7 10*3/uL — ABNORMAL HIGH (ref 1.7–7.7)
Neutrophils Relative %: 90 %
Platelets: 275 10*3/uL (ref 150–400)
RBC: 3.67 MIL/uL — ABNORMAL LOW (ref 3.87–5.11)
RDW: 15.8 % — ABNORMAL HIGH (ref 11.5–15.5)
WBC: 9.7 10*3/uL (ref 4.0–10.5)
nRBC: 0.2 % (ref 0.0–0.2)

## 2020-04-14 LAB — D-DIMER, QUANTITATIVE: D-Dimer, Quant: 3.85 ug/mL-FEU — ABNORMAL HIGH (ref 0.00–0.50)

## 2020-04-14 LAB — GLUCOSE, CAPILLARY
Glucose-Capillary: 407 mg/dL — ABNORMAL HIGH (ref 70–99)
Glucose-Capillary: 288 mg/dL — ABNORMAL HIGH (ref 70–99)
Glucose-Capillary: 203 mg/dL — ABNORMAL HIGH (ref 70–99)
Glucose-Capillary: 228 mg/dL — ABNORMAL HIGH (ref 70–99)

## 2020-04-14 MED ORDER — INSULIN ASPART 100 UNIT/ML ~~LOC~~ SOLN
15.0000 [IU] | Freq: Once | SUBCUTANEOUS | Status: AC
  Administered 2020-04-14: 15 [IU] via SUBCUTANEOUS

## 2020-04-14 MED ORDER — INSULIN DETEMIR 100 UNIT/ML ~~LOC~~ SOLN
10.0000 [IU] | Freq: Every day | SUBCUTANEOUS | Status: DC
  Administered 2020-04-14 – 2020-04-21 (×8): 10 [IU] via SUBCUTANEOUS
  Filled 2020-04-14 (×9): qty 0.1

## 2020-04-14 NOTE — Evaluation (Signed)
Occupational Therapy Evaluation Patient Details Name: Ann [name] MRN: [id number] DOB: 05-Nov-1950 Today's Date: 04/14/2020    History of Present Illness Pt is a 69 y.o. female recently admitted to [facility] for COVID-19 related GI and respiratory distress (04/03/20-04/09/20 declined SNF recommendation and went home), now admitted 04/11/20 with worsening symptoms. Workup for acute hypoxic respiratory failure due to COVID-19 viral pneumonitis, progressive AKI on CKD IV, acute metabolic encephalopathy. S/p RIJ temp HD cath placement 12/19. (+) RLE DVT 12/21. PMH includes HTN, CAD, DM, CKD, obesity, chronic pain.   Clinical Impression   Pt unreliable historian today, pleasantly confused and following approx 50% of simple one step directions lacking initiation. Pt today was mod A +2 for bed mobility, at least min A to sit EOB, mod to max A for grooming tasks EOB. Max A +2 for sit<>stand with zero balance in standing (unable to achieve upright) SpO2 >92% on RA and HR 78-113. Pt limited by lethargy and OT to continue to provide skilled intervention acutely, and Pt will require SNF post-acute to maximize safety and independence in ADL and functional transfers. Next session plan for OOB (stayed in bed to due pending HD today).    Follow Up Recommendations  SNF    Equipment Recommendations  None recommended by OT    Recommendations for Other Services       Precautions / Restrictions Precautions Precautions: Fall;Other (comment) Precaution Comments: Urine incontinence; new R IJ temp cath Restrictions Weight Bearing Restrictions: No      Mobility Bed Mobility Overal bed mobility: Needs Assistance Bed Mobility: Supine to Sit;Sit to Supine     Supine to sit: Mod assist;+2 for safety/equipment;HOB elevated Sit to supine: Max assist;+2 for safety/equipment   General bed mobility comments: Pt with no initiation of movements, assist for BLE to EOB and for trunk elevation    Transfers Overall  transfer level: Needs assistance Equipment used: Rolling walker (2 wheeled) Transfers: Sit to/from Stand Sit to Stand: Mod assist;+2 physical assistance;+2 safety/equipment (does not reach full upright, squat only)         General transfer comment: heavily cued with "1, 2, 3" and Pt unable to reach full stand, immediately sits down    Balance Overall balance assessment: Needs assistance Sitting-balance support: Feet supported;Bilateral upper extremity supported Sitting balance-Leahy Scale: Poor Sitting balance - Comments: leans against therapist in sitting, at least min A to sit EOB   Standing balance support: Bilateral upper extremity supported;During functional activity Standing balance-Leahy Scale: Zero Standing balance comment: reliant on RW and support                           ADL either performed or assessed with clinical judgement   ADL Overall ADL's : Needs assistance/impaired Eating/Feeding: Moderate assistance   Grooming: Wash/dry face;Maximal assistance;Sitting Grooming Details (indicate cue type and reason): ultimately Pt required hand over hand assist to perform face washing Upper Body Bathing: Maximal assistance   Lower Body Bathing: Total assistance   Upper Body Dressing : Maximal assistance Upper Body Dressing Details (indicate cue type and reason): to re-don gown Lower Body Dressing: Maximal assistance;Total assistance   Toilet Transfer: Maximal assistance;+2 for physical assistance;+2 for safety/equipment (squat, does not stand all the way)   Toileting- Clothing Manipulation and Hygiene: Total assistance;Sit to/from stand Toileting - Clothing Manipulation Details (indicate cue type and reason): decreased standing tolerance and balance requiring total A for peri care     Functional mobility during ADLs:  (  unable at this time) General ADL Comments: cognition and fatigue impairs Pt's ADL participation     Vision Baseline Vision/History: Wears  glasses Wears Glasses: At all times Additional Comments: glasses not available     Perception     Praxis      Pertinent Vitals/Pain Pain Assessment: Faces Faces Pain Scale: Hurts a little bit Pain Location: Generalized, as well as perineal area with purewick Pain Descriptors / Indicators: Tiring;Grimacing;Discomfort Pain Intervention(s): Monitored during session;Repositioned     Hand Dominance Right   Extremity/Trunk Assessment Upper Extremity Assessment Upper Extremity Assessment: Generalized weakness;Difficult to assess due to impaired cognition (poor participation in functional tasks)   Lower Extremity Assessment Lower Extremity Assessment: Defer to PT evaluation       Communication Communication Communication: No difficulties   Cognition Arousal/Alertness: Lethargic;Awake/alert Behavior During Therapy: Flat affect Overall Cognitive Status: Impaired/Different from baseline Area of Impairment: Orientation;Attention;Memory;Following commands;Safety/judgement;Awareness;Problem solving                 Orientation Level: Disoriented to;Place;Time;Situation Current Attention Level: Focused;Sustained Memory: Decreased short-term memory Following Commands: Follows one step commands inconsistently;Follows one step commands with increased time Safety/Judgement: Decreased awareness of safety;Decreased awareness of deficits Awareness: Intellectual Problem Solving: Slow processing;Decreased initiation;Requires verbal cues General Comments: Pt becoming more alert once sitting up, still leaning onto therapist's shoulder and closing eyes. Required frequent repetition of name to attend to task, then stating, "Why do you keep yelling my name?" Confused and poor historian   General Comments  Pt on RA throughout session, with SpO2 maintaining >95% throughout. HR ranged from 78-113. Pt very lethargic and confused throughout session.    Exercises     Shoulder Instructions       Home Living Family/patient expects to be discharged to:: Skilled nursing facility Living Arrangements: Other relatives                               Additional Comments: Per recent admission at [facility], pt lives in 1st floor apartment with grandaughter and grandson who can provide 24/7 supervision      Prior Functioning/Environment Level of Independence: Needs assistance  Gait / Transfers Assistance Needed: Prior to initial admission at [facility], pt ambulating household and community distances with RW; since return home 1-2 days, has been sleeping a lot and barely getting up, family could not get into shower (per family report on admission) ADL's / Homemaking Assistance Needed: Prior to initial admission at [facility], pt reports Mod I with BADLs.  Uses BSC on top of toilet. Does not enter tub/shower, but sponge baths sitting on shower chair at sink. Grandkids provide cooking/cleaning/groceries/transportation or pt takes medical van.   Comments: Pt confused and poor historian; info taken from recent admission 1 wk prior at [facility]        OT Problem List: Decreased strength;Decreased activity tolerance;Decreased knowledge of use of DME or AE;Impaired balance (sitting and/or standing);Decreased knowledge of precautions      OT Treatment/Interventions: Self-care/ADL training;Therapeutic exercise;Therapeutic activities;Energy conservation;DME and/or AE instruction;Patient/family education;Balance training    OT Goals(Current goals can be found in the care plan section) Acute Rehab OT Goals Patient Stated Goal: go to sleep OT Goal Formulation: With patient Time For Goal Achievement: 04/28/20 Potential to Achieve Goals: Good ADL Goals Pt Will Perform Grooming: with set-up;sitting Pt Will Perform Upper Body Dressing: with min assist;sitting Pt Will Perform Lower Body Dressing: with caregiver independent in assisting;sit to/from stand;with mod assist Pt Will  Transfer to Toilet: with min  assist;stand pivot transfer;bedside commode Pt Will Perform Toileting - Clothing Manipulation and hygiene: with mod assist;sit to/from stand;with caregiver independent in assisting Additional ADL Goal #1: Pt will follow one step commands 80% of the time for ADL completion Additional ADL Goal #2: Pt will perform bed mobility at min A prior to engaging in ADL  OT Frequency: Min 2X/week   Barriers to D/C:            Co-evaluation PT/OT/SLP Co-Evaluation/Treatment: Yes Reason for Co-Treatment: Necessary to address cognition/behavior during functional activity;For patient/therapist safety;To address functional/ADL transfers PT goals addressed during session: Mobility/safety with mobility;Balance;Strengthening/ROM OT goals addressed during session: ADL's and self-care;Strengthening/ROM      AM-PAC OT "6 Clicks" Daily Activity     Outcome Measure Help from another person eating meals?: A Lot Help from another person taking care of personal grooming?: A Lot Help from another person toileting, which includes using toliet, bedpan, or urinal?: A Lot Help from another person bathing (including washing, rinsing, drying)?: A Lot Help from another person to put on and taking off regular upper body clothing?: A Lot Help from another person to put on and taking off regular lower body clothing?: Total 6 Click Score: 11   End of Session Equipment Utilized During Treatment: Gait belt;Rolling walker Nurse Communication: Mobility status  Activity Tolerance: Patient limited by lethargy Patient left: in bed;with call bell/phone within reach;with bed alarm set;Other (comment) (all 4 rails up)  OT Visit Diagnosis: Unsteadiness on feet (R26.81);History of falling (Z91.81);Muscle weakness (generalized) (M62.81)                Time: 3491-7915 OT Time Calculation (min): 17 min Charges:  OT General Charges $OT Visit: 1 Visit OT Evaluation $OT Eval Moderate Complexity: [place] OTR/L Acute  Rehabilitation Services Pager: [phone] Office: [place] 04/14/2020, 10:52 AM

## 2020-04-14 NOTE — Progress Notes (Addendum)
Patient ID: AMAYRA KIEDROWSKI, female   DOB: 1950/09/11, 69 y.o.   MRN: [id number] S: found to have right peroneal DVT by duplex yesterday.  No complaints today but seems more confused. O:BP (!) 155/124 (BP Location: Right Arm)   Pulse 73   Temp 97.6 F (36.4 C) (Oral)   Resp 19   Wt 101.9 kg   SpO2 98%   BMI 41.09 kg/m   Intake/Output Summary (Last 24 hours) at 04/14/2020 1431 Last data filed at 04/14/2020 0940 Gross per 24 hour  Intake 480 ml  Output 250 ml  Net 230 ml   Intake/Output: I/O last 3 completed shifts: In: 260 [P.O.:260] Out: 500 [Urine:500]  Intake/Output this shift:  Total I/O In: 240 [P.O.:240] Out: 250 [Urine:250] Weight change:  Gen: NAD CVS: RRR, no rub Resp: cta Abd: +BS, soft, NT/DN Ext: no edema  Recent Labs  Lab 04/08/20 0403 04/09/20 0407 04/11/20 1032 04/12/20 0416 04/13/20 0839 04/14/20 0145  NA 141 142 141 142 142 140  K 3.6 3.9 4.6 5.0 4.4 4.4  CL 111 110 109 110 104 105  CO2 16* 16* 14* 15* 18* 19*  GLUCOSE 88 121* 203* 212* 274* 397*  BUN 86* 86* 103* 111* 80* 91*  CREATININE 6.20* 6.26* 8.99* 9.47* 6.80* 6.78*  ALBUMIN 2.5* 2.5* 2.3* 2.2* 2.4* 2.5*  CALCIUM 7.7* 8.0* 8.5* 8.7* 8.7* 8.4*  PHOS 5.3* 5.5*  --  6.6*  --   --   AST  --   --  52* 37 27 21  ALT  --   --  34 29 24 22    Liver Function Tests: Recent Labs  Lab 04/12/20 0416 04/13/20 0839 04/14/20 0145  AST 37 27 21  ALT 29 24 22   ALKPHOS 70 67 70  BILITOT 0.6 0.8 0.6  PROT 5.6* 5.8* 5.7*  ALBUMIN 2.2* 2.4* 2.5*   No results for input(s): LIPASE, AMYLASE in the last 168 hours. No results for input(s): AMMONIA in the last 168 hours. CBC: Recent Labs  Lab 04/11/20 1032 04/11/20 1712 04/12/20 0416 04/13/20 0839 04/14/20 0145  WBC 10.3 10.7* 10.0 10.3 9.7  NEUTROABS 8.0*  --  8.6* 9.2* 8.7*  HGB 9.0* 8.0* 7.7* 9.6* 10.7*  HCT 29.7* 26.2* 25.0* 30.0* 32.5*  MCV 91.4 91.9 91.2 87.0 88.6  PLT 321 286 306 266 275   Cardiac Enzymes: No results for input(s):  CKTOTAL, CKMB, CKMBINDEX, TROPONINI in the last 168 hours. CBG: Recent Labs  Lab 04/13/20 1209 04/13/20 1735 04/13/20 2031 04/14/20 0731 04/14/20 1209  GLUCAP 251* 251* 288* 407* 288*    Iron Studies:  Recent Labs    04/12/20 0416  FERRITIN 819*   Studies/Results: VAS [place] LOWER EXTREMITY VENOUS (DVT)  Result Date: 04/13/2020  Lower Venous DVT Study Indications: Covid +, elevated D dimer.  Limitations: Body habitus, cooperation and poor ultrasound/tissue interface. Performing Technologist: Antonieta Pert RDMS, RVT  Examination Guidelines: A complete evaluation includes B-mode imaging, spectral Doppler, color Doppler, and power Doppler as needed of all accessible portions of each vessel. Bilateral testing is considered an integral part of a complete examination. Limited examinations for reoccurring indications may be performed as noted. The reflux portion of the exam is performed with the patient in reverse Trendelenburg.  +---------+---------------+---------+-----------+----------+--------------+ RIGHT    CompressibilityPhasicitySpontaneityPropertiesThrombus Aging +---------+---------------+---------+-----------+----------+--------------+ CFV      Full           Yes      Yes                                 +---------+---------------+---------+-----------+----------+--------------+  SFJ      Full                                                        +---------+---------------+---------+-----------+----------+--------------+ FV Prox  Full                                                        +---------+---------------+---------+-----------+----------+--------------+ FV Mid   Full                                                        +---------+---------------+---------+-----------+----------+--------------+ FV DistalFull                                                        +---------+---------------+---------+-----------+----------+--------------+ PFV       Full                                                        +---------+---------------+---------+-----------+----------+--------------+ POP      Full           Yes      Yes                                 +---------+---------------+---------+-----------+----------+--------------+ PTV      Full                                                        +---------+---------------+---------+-----------+----------+--------------+ PERO     Partial                            dilated   Acute          +---------+---------------+---------+-----------+----------+--------------+ GSV      Full                                                        +---------+---------------+---------+-----------+----------+--------------+   +---------+---------------+---------+-----------+----------+--------------+ LEFT     CompressibilityPhasicitySpontaneityPropertiesThrombus Aging +---------+---------------+---------+-----------+----------+--------------+ CFV      Full           Yes      Yes                                 +---------+---------------+---------+-----------+----------+--------------+  SFJ      Full                                                        +---------+---------------+---------+-----------+----------+--------------+ FV Prox  Full                                                        +---------+---------------+---------+-----------+----------+--------------+ FV Mid   Full                                                        +---------+---------------+---------+-----------+----------+--------------+ FV DistalFull                                                        +---------+---------------+---------+-----------+----------+--------------+ PFV      Full                                                        +---------+---------------+---------+-----------+----------+--------------+ POP      Full           Yes      Yes                                  +---------+---------------+---------+-----------+----------+--------------+ PTV      Full                                                        +---------+---------------+---------+-----------+----------+--------------+ PERO     Full                                                        +---------+---------------+---------+-----------+----------+--------------+ GSV      Full                                                        +---------+---------------+---------+-----------+----------+--------------+     Summary: RIGHT: - Findings consistent with acute deep vein thrombosis involving the right peroneal veins. - No cystic structure found in the popliteal fossa. - Small segment of thrombus in the right calf.  LEFT: - There is no evidence of deep vein thrombosis in  the lower extremity. However, portions of this examination were limited- see technologist comments above.  - No cystic structure found in the popliteal fossa. - Difficult exam bilaterally due to large body habitus, poor penetration and poor patient cooperation.  *See table(s) above for measurements and observations. Electronically signed by Harold Barban MD on 04/13/2020 at 6:07:50 PM.    Final    . sodium chloride   Intravenous Once  . apixaban  10 mg Oral BID   Followed by  . [START ON 04/20/2020] apixaban  5 mg Oral BID  . vitamin C  500 mg Oral Daily  . Chlorhexidine Gluconate Cloth  6 each Topical Q0600  . hydrALAZINE  50 mg Oral TID  . insulin aspart  0-6 Units Subcutaneous TID WC  . insulin detemir  10 Units Subcutaneous Daily  . Ipratropium-Albuterol  1 puff Inhalation Q6H  . metoprolol tartrate  25 mg Oral BID  . nystatin   Topical BID  . pantoprazole  20 mg Oral Daily  . pravastatin  40 mg Oral QPM  . rOPINIRole  0.25 mg Oral QPM  . sodium bicarbonate  650 mg Oral BID    BMET    Component Value Date/Time   NA 140 04/14/2020 0145   K 4.4 04/14/2020 0145   CL 105 04/14/2020 0145    CO2 19 (L) 04/14/2020 0145   GLUCOSE 397 (H) 04/14/2020 0145   BUN 91 (H) 04/14/2020 0145   CREATININE 6.78 (H) 04/14/2020 0145   CREATININE 1.53 (H) 09/28/2011 0945   CALCIUM 8.4 (L) 04/14/2020 0145   CALCIUM 9.6 11/01/2009 1543   GFRNONAA 6 (L) 04/14/2020 0145   GFRNONAA 36 (L) 09/28/2011 0945   GFRAA 21 (L) 02/27/2019 0548   GFRAA 42 (L) 09/28/2011 0945   CBC    Component Value Date/Time   WBC 9.7 04/14/2020 0145   RBC 3.67 (L) 04/14/2020 0145   HGB 10.7 (L) 04/14/2020 0145   HCT 32.5 (L) 04/14/2020 0145   HCT 31.2 (L) 02/21/2019 0018   PLT 275 04/14/2020 0145   MCV 88.6 04/14/2020 0145   MCH 29.2 04/14/2020 0145   MCHC 32.9 04/14/2020 0145   RDW 15.8 (H) 04/14/2020 0145   LYMPHSABS 0.4 (L) 04/14/2020 0145   MONOABS 0.3 04/14/2020 0145   EOSABS 0.0 04/14/2020 0145   BASOSABS 0.0 04/14/2020 0145      Assessment and plan: 1. AKI/CKD stage IV- (baseline Scr 2.7-3.3) rising bun/creat in setting of covid-19 infection. S/p HD 04/11/20 due to AMS and had second treatement 04/12/20.  1. She is more awake and alert today 2. No uremic symptoms 3. Had 1st HD session on 04/12/20 and BUN/Cr stable since, however poor documentation of UOP. 4. Was going to hold HD and follow UOP and labs, however after discussing case with Dr. Waldron Labs will plan for HD today and see if her confusion improves with further dialysis. 2. covid-19 infection- per primary team. 3. HTN/Volume- UF with HD and follow 4. CAD- stable 5. Right peroneal DVT- started on Eliquis per primary 6. Anemia of CKD stage IV- low Hgb at 7.7. Start ESA and follow H/H and iron stores. Transfuse prn. 7. Vascular access- RIJ temp HD catheter placed 04/11/20. Had been referred to [facility] for access placement. Will likely need tunneled HD catheter if no significant improvement of UOP and Scr.   Donetta Potts, MD [employer] [phone]

## 2020-04-14 NOTE — Evaluation (Signed)
Physical Therapy Evaluation Patient Details Name: Ann [name] MRN: [id number] DOB: 23-Jun-1950 Today's Date: 04/14/2020   History of Present Illness  Pt is a 69 y.o. female recently admitted to [facility] for COVID-19 related GI and respiratory distress (04/03/20-04/09/20 declined SNF recommendation and went home), now admitted 04/11/20 with worsening symptoms. Workup for acute hypoxic respiratory failure due to COVID-19 viral pneumonitis, progressive AKI on CKD IV, acute metabolic encephalopathy. S/p RIJ temp HD cath placement 12/19. (+) RLE DVT 12/21. PMH includes HTN, CAD, DM, CKD, obesity, chronic pain.    Clinical Impression  Pt presents with an overall decrease in functional mobility secondary to above. Pt pleasantly confused, lethargic and poor historian; per chart, lives at home with family, requiring increased assist at home since recent admission at [facility] for [place] (d/c home 04/09/20); prior to this, ambulatory with RW and caring for self. Today, pt required mod-maxA+2 to initiate mobility and attempt standing. Pt limited by generalized weakness, decreased activity tolerance and cognitive impairments. Hopeful mental status will improve after HD today. Pt would benefit from continued acute PT services to maximize functional mobility and independence prior to d/c with SNF-level therapies.   SpO2 95% on RA, HR 78-113    Follow Up Recommendations SNF;Supervision/Assistance - 24 hour    Equipment Recommendations  Wheelchair cushion (measurements PT);Wheelchair (measurements PT)    Recommendations for Other Services       Precautions / Restrictions Precautions Precautions: Fall;Other (comment) Precaution Comments: Urine incontinence; new R IJ temp cath Restrictions Weight Bearing Restrictions: No      Mobility  Bed Mobility Overal bed mobility: Needs Assistance Bed Mobility: Supine to Sit;Sit to Supine     Supine to sit: Mod assist;+2 for safety/equipment;HOB elevated Sit to supine:  Max assist;+2 for safety/equipment   General bed mobility comments: Pt with no initiation of movements, assist for BLE to EOB and for trunk elevation    Transfers Overall transfer level: Needs assistance Equipment used: Rolling walker (2 wheeled);2 person hand held assist Transfers: Sit to/from Stand Sit to Stand: Mod assist;Max assist;+2 physical assistance;+2 safety/equipment (does not reach full upright, squat only)         General transfer comment: heavily cued with "1, 2, 3" with HHA to initiate movement, able to achieve almost fully upright then knees buckling and return to sit despite maxA+2  Ambulation/Gait                Stairs            Wheelchair Mobility    Modified Rankin (Stroke Patients Only)       Balance Overall balance assessment: Needs assistance Sitting-balance support: Feet supported;Bilateral upper extremity supported Sitting balance-Leahy Scale: Poor Sitting balance - Comments: leans against therapist in sitting, at least min A to sit EOB   Standing balance support: Bilateral upper extremity supported;During functional activity Standing balance-Leahy Scale: Zero Standing balance comment: reliant on RW and support                             Pertinent Vitals/Pain Pain Assessment: Faces Faces Pain Scale: Hurts a little bit Pain Location: Generalized, as well as perineal area with purewick Pain Descriptors / Indicators: Tiring;Grimacing;Discomfort Pain Intervention(s): Monitored during session;Repositioned    Home Living Family/patient expects to be discharged to:: Skilled nursing facility Living Arrangements: Other relatives               Additional Comments: Per recent admission at [facility],  pt lives in 1st floor apartment with grandaughter and grandson who can provide 24/7 supervision    Prior Function Level of Independence: Needs assistance   Gait / Transfers Assistance Needed: Prior to initial admission at [facility], pt  ambulating household and community distances with RW; since return home 1-2 days, has been sleeping a lot and barely getting up, family could not get into shower (per family report on admission)  ADL's / Homemaking Assistance Needed: Prior to initial admission at [facility], pt reports Mod I with BADLs.  Uses BSC on top of toilet. Does not enter tub/shower, but sponge baths sitting on shower chair at sink. Grandkids provide cooking/cleaning/groceries/transportation or pt takes medical van.  Comments: Pt confused and poor historian; info taken from recent admission 1 wk prior at [place]: Right    Extremity/Trunk Assessment   Upper Extremity Assessment Upper Extremity Assessment: Generalized weakness;Difficult to assess due to impaired cognition    Lower Extremity Assessment Lower Extremity Assessment: Generalized weakness;Difficult to assess due to impaired cognition (poor participation in functional tasks)       Communication   Communication: No difficulties  Cognition Arousal/Alertness: Lethargic;Awake/alert Behavior During Therapy: Flat affect Overall Cognitive Status: Impaired/Different from baseline Area of Impairment: Orientation;Attention;Memory;Following commands;Safety/judgement;Awareness;Problem solving                 Orientation Level: Disoriented to;Place;Time;Situation Current Attention Level: Focused;Sustained Memory: Decreased short-term memory Following Commands: Follows one step commands inconsistently;Follows one step commands with increased time Safety/Judgement: Decreased awareness of safety;Decreased awareness of deficits Awareness: Intellectual Problem Solving: Slow processing;Decreased initiation;Requires verbal cues General Comments: Pt becoming more alert once sitting up, still leaning onto therapist's shoulder and closing eyes. Required frequent repetition of name to attend to task, then stating, "Why do you keep yelling my  name?" Confused and poor historian      General Comments General comments (skin integrity, edema, etc.): Pt on RA throughout session, with SpO2 maintaining >95% throughout. HR ranged from 78-113. Pt very lethargic and confused throughout session.    Exercises     Assessment/Plan    PT Assessment Patient needs continued PT services  PT Problem List Decreased strength;Decreased activity tolerance;Decreased balance;Decreased mobility;Decreased cognition;Decreased knowledge of use of DME;Cardiopulmonary status limiting activity;Decreased safety awareness       PT Treatment Interventions DME instruction;Gait training;Functional mobility training;Therapeutic activities;Therapeutic exercise;Balance training;Neuromuscular re-education;Patient/family education    PT Goals (Current goals can be found in the Care Plan section)  Acute Rehab PT Goals Patient Stated Goal: go to sleep PT Goal Formulation: With patient Time For Goal Achievement: 04/28/20 Potential to Achieve Goals: Fair    Frequency Min 2X/week   Barriers to discharge        Co-evaluation PT/OT/SLP Co-Evaluation/Treatment: Yes Reason for Co-Treatment: Necessary to address cognition/behavior during functional activity;For patient/therapist safety;To address functional/ADL transfers PT goals addressed during session: Mobility/safety with mobility;Balance;Strengthening/ROM OT goals addressed during session: ADL's and self-care;Strengthening/ROM       AM-PAC PT "6 Clicks" Mobility  Outcome Measure Help needed turning from your back to your side while in a flat bed without using bedrails?: A Lot Help needed moving from lying on your back to sitting on the side of a flat bed without using bedrails?: A Lot Help needed moving to and from a bed to a chair (including a wheelchair)?: A Lot Help needed standing up from a chair using your arms (e.g., wheelchair or bedside chair)?: A Lot Help needed to walk in  hospital room?:  Total Help needed climbing 3-5 steps with a railing? : Total 6 Click Score: 10    End of Session   Activity Tolerance: Patient limited by fatigue;Patient limited by lethargy Patient left: in bed;with call bell/phone within reach;with bed alarm set Nurse Communication: Mobility status PT Visit Diagnosis: Other abnormalities of gait and mobility (R26.89);Muscle weakness (generalized) (M62.81)    Time: 0051-1021 PT Time Calculation (min) (ACUTE ONLY): 17 min   Charges:   PT Evaluation $PT Eval Moderate Complexity: [place], PT, DPT Acute Rehabilitation Services  Pager [phone] Office [place] 04/14/2020, 11:15 AM

## 2020-04-14 NOTE — Discharge Instructions (Signed)

## 2020-04-14 NOTE — Progress Notes (Addendum)
PROGRESS NOTE                                                                             PROGRESS NOTE                                                                                                                                                                                                             Patient Demographics:    Ann [name], is a 69 y.o. female, DOB - 1951-03-02, DXI:[id number]  Outpatient Primary MD for the patient is [name], MD    LOS - 3  Admit date - 04/11/2020    Chief Complaint  Patient presents with  . Covid Positive       Brief Narrative    HPI: Ann [name] is a 69 y.o. female with history h/o stage IV CKD with associated anemia, CAD, T2DM, HTN, HLD, GERD, asthma, OSA on CPAP, chronic lumbar back pain who was recently admitted 12/11-12/17/21 to [facility] for COVID-19 related GI/resp symptoms treated with Remdesevir ( steroids not given as no hypoxia throughout the stay) presents to ED today with worsening symptoms. In the ED 12/11, she was felt to be dehydrated in addition to CKD baseline creatinine 2.7-3.3, creatinine was 5.3 on admission,did peak at 6.5,  plateaued around 6.2 range for 3 days prior to discharge. Diuretics and ACEiwere held on discharge and nephrology follow up arranged. She was deconditioned at discharge but declined SNF , went home with [facility].  Per daughter, patient has been eating poorly and sleeping most of the time since last hospital discharge.  She also appeared to be having labored breathing and unable to perform any of her ADLs.  Daughter asking if staff here can help her take a bath as she has been unable to get her to the bathroom.  Patient currently oriented to self/person only but not to place or time.  She is unable to provide any details regarding hospital visit but does report productive cough and shortness of breath.  ED findings today: Afebrile, SBP  1 20-160, RR 15-19, hypoxic on arrival  requiring NRB M transiently-transitioned to 3 L O2 now.  Lab work shows worsening renal function with BUN 86->103, creatinine 6.26->8.99, bicarb 14, potassium 4.6, albumin 2.6->2.3.  CRP 20.6, procalcitonin 2.68, lactate 1.7.  Ferritin 1320, LDH 281, triglyceride 134, D-dimer 2.6->6.0 WBC 10.3, hemoglobin 9.0, platelet 321.  Chest x-ray shows "new mild bilateral upper lobe airspace disease, suspicious for viral pneumonia."  She received 10 mg IV Decadron x1 and on IV fluids NS at 100 mL/h.  Per EDP, nephrology has been consulted and patient requested to be admitted for further evaluation and management.    Subjective:    Ticara Schum today confusion has improved, patient herself denies any complaints, no significant events as discussed with staff.  Remains confused, but she is more awake and alert this morning , she denies any complaints. ..     Assessment  & Plan :    Principal Problem:   Acute respiratory failure with hypoxia (HCC) Active Problems:   HYPERTENSION   CAD (coronary artery disease)   Obesity, Class III, BMI 40-49.9 (morbid obesity) ([place])   Acute encephalopathy   Chronic pain   2019 novel coronavirus disease (COVID-19)   GERD (gastroesophageal reflux disease)   AKI (acute kidney injury) ([place])  Acute Hypoxic Resp. Failure due to Acute Covid 19 Viral Pneumonitis during the ongoing 2020 Covid 19 Pandemic -  -She is unvaccinated -He was treated with steroids and Remdesivir during recent hospitalization, was started on steroids, no hypoxia, steroid has been stopped -Received Actemra on admission. -Was encouraged to use incentive spirometry and flutter valve.  Acute DVT -Elevated d D-dimers, venous Doppler was obtained she is having right peroneal veins DVT, given her Covid infection, and hypercoagulable state, high risk for VTE, will proceed with full anticoagulation with Eliquis.  Progressive AKI on CKD stage IV:  -Management per renal, started on hemodialysis this hospital  stay, function to be monitored closely regarding further decision of she is need to continue dialysis and access this admission.  Acute metabolic encephalopathy: - Likely related to significant uremia (greater than 100) and COVID-19 infection.  Patient has been improving gradually, CT head with no acute findings.  Diabetes mellitus type 2:  -CBG started to increase, so she was started on Levemir 10 units especially she started to eat now.,  Continue with insulin sliding scale  Anemia -The setting of chronic kidney disease, she has 1 unit PRBC 12/20 with good response.  CAD: Resume home medications, aspirin, beta-blockers and statins.  Hypertension:  -Resume home medications, will however reduce Norvasc to 5 mg and titrate as needed.  Diuretics/ACE inhibitors held in last admission  Chronic pain: Hold Neurontin/Cymbalta for now until mental status improves.  GERD: Resume PPI unless otherwise advised by nephrology.  Obesity class III BMI 40-49.9: Follow-up PCP, weight modifying recommendations upon discharge    SpO2: 98 % O2 Flow Rate (L/min): 3 L/min  Recent Labs  Lab 04/08/20 0403 04/09/20 0407 04/11/20 1031 04/11/20 1032 04/11/20 1712 04/12/20 0416 04/13/20 0839 04/14/20 0145  WBC 7.0 7.8  --  10.3 10.7* 10.0 10.3 9.7  PLT 198 220  --  321 286 306 266 275  CRP 14.4*  --   --  20.6*  --   --   --   --   DDIMER 1.86*  --   --  6.00*  --  7.15* 5.31* 3.85*  PROCALCITON  --   --   --  2.68  --   --   --   --  AST  --   --   --  52*  --  37 27 21  ALT  --   --   --  34  --  29 24 22   ALKPHOS  --   --   --  73  --  70 67 70  BILITOT  --   --   --  0.9  --  0.6 0.8 0.6  ALBUMIN 2.5* 2.5*  --  2.3*  --  2.2* 2.4* 2.5*  LATICACIDVEN  --   --  1.7  --  1.1  --   --   --        ABG     Component Value Date/Time   TCO2 20 09/27/2008 1636       Condition - Extremely Guarded  Family Communication  : Discussed with daughter [name] by phone 19/50/9326, she was  left a voicemail 04/14/2020  Code Status :  Full  Consults  :  Renal, PCCM  Procedures  : A hemodialysis catheter placement by PCCM 12/19, 1 unit PRBC transfusion on 12/20  Disposition Plan  :    Status is: Inpatient  Remains inpatient appropriate because:Hemodynamically unstable   Dispo: The patient is from: Home              Anticipated d/c is to: SNF              Anticipated d/c date is: > 3 days              Patient currently is not medically stable to d/c.      DVT Prophylaxis  :  Heparin>>Eliquis  Lab Results  Component Value Date   PLT 275 04/14/2020    Diet :  Diet Order            Diet renal/carb modified with fluid restriction Diet-HS Snack? Nothing; Fluid restriction: 1200 mL Fluid; Room service appropriate? No; Fluid consistency: Thin  Diet effective now                  Inpatient Medications  Scheduled Meds: . sodium chloride   Intravenous Once  . apixaban  10 mg Oral BID   Followed by  . [START ON 04/20/2020] apixaban  5 mg Oral BID  . vitamin C  500 mg Oral Daily  . Chlorhexidine Gluconate Cloth  6 each Topical Q0600  . hydrALAZINE  50 mg Oral TID  . insulin aspart  0-6 Units Subcutaneous TID WC  . insulin detemir  10 Units Subcutaneous Daily  . Ipratropium-Albuterol  1 puff Inhalation Q6H  . metoprolol tartrate  25 mg Oral BID  . nystatin   Topical BID  . pantoprazole  20 mg Oral Daily  . pravastatin  40 mg Oral QPM  . rOPINIRole  0.25 mg Oral QPM  . sodium bicarbonate  650 mg Oral BID   Continuous Infusions:  PRN Meds:.albuterol, bisacodyl, guaiFENesin-dextromethorphan, ondansetron **OR** ondansetron (ZOFRAN) IV  Antibiotics  :    Anti-infectives (From admission, onward)   Start     Dose/Rate Route Frequency Ordered Stop   04/11/20 1300  cefTRIAXone (ROCEPHIN) 1 g in sodium chloride 0.9 % 100 mL IVPB  Status:  Discontinued        1 g 200 mL/hr over 30 Minutes Intravenous Every 24 hours 04/11/20 1252 04/13/20 1418   04/11/20 1300   doxycycline (VIBRA-TABS) tablet 100 mg  Status:  Discontinued        100 mg Oral Every 12 hours  04/11/20 1252 04/13/20 1418        Persephanie Laatsch M.D on 04/14/2020 at 5:12 PM  To page go to [email or web address]  [facility] -  Office  [phone]       Objective:   Vitals:   04/14/20 0008 04/14/20 0359 04/14/20 0728 04/14/20 1206  BP: (!) 114/47 (!) 122/58 132/65 (!) 155/124  Pulse: 76 76 75 73  Resp: 17 14 18 19   Temp: 97.6 F (36.4 C) 98 F (36.7 C) 97.6 F (36.4 C)   TempSrc: Axillary Oral Oral Oral  SpO2: 90% 90% 94% 98%  Weight:        Wt Readings from Last 3 Encounters:  04/13/20 101.9 kg  04/09/20 99 kg  02/25/19 115 kg     Intake/Output Summary (Last 24 hours) at 04/14/2020 1712 Last data filed at 04/14/2020 1447 Gross per 24 hour  Intake 500 ml  Output 250 ml  Net 250 ml     Physical Exam  Awake, more coherent and interactive today, but remains significantly confused and slow to respond, no focal deficits. Symmetrical Chest wall movement, Good air movement bilaterally, CTAB RRR,No Gallops,Rubs or new Murmurs, No Parasternal Heave +ve B.Sounds, Abd Soft, No tenderness, No rebound - guarding or rigidity. No Cyanosis, Clubbing or edema, No new Rash or bruise       Data Review:    CBC Recent Labs  Lab 04/11/20 1032 04/11/20 1712 04/12/20 0416 04/13/20 0839 04/14/20 0145  WBC 10.3 10.7* 10.0 10.3 9.7  HGB 9.0* 8.0* 7.7* 9.6* 10.7*  HCT 29.7* 26.2* 25.0* 30.0* 32.5*  PLT 321 286 306 266 275  MCV 91.4 91.9 91.2 87.0 88.6  MCH 27.7 28.1 28.1 27.8 29.2  MCHC 30.3 30.5 30.8 32.0 32.9  RDW 16.2* 16.3* 16.2* 15.5 15.8*  LYMPHSABS 1.0  --  0.6* 0.5* 0.4*  MONOABS 0.8  --  0.2 0.3 0.3  EOSABS 0.1  --  0.0 0.0 0.0  BASOSABS 0.0  --  0.0 0.0 0.0    Recent Labs  Lab 04/08/20 0403 04/09/20 0407 04/11/20 1031 04/11/20 1032 04/11/20 1712 04/12/20 0416 04/13/20 0839 04/14/20 0145  NA 141 142  --  141  --  142 142 140  K 3.6 3.9   --  4.6  --  5.0 4.4 4.4  CL 111 110  --  109  --  110 104 105  CO2 16* 16*  --  14*  --  15* 18* 19*  GLUCOSE 88 121*  --  203*  --  212* 274* 397*  BUN 86* 86*  --  103*  --  111* 80* 91*  CREATININE 6.20* 6.26*  --  8.99*  --  9.47* 6.80* 6.78*  CALCIUM 7.7* 8.0*  --  8.5*  --  8.7* 8.7* 8.4*  AST  --   --   --  52*  --  37 27 21  ALT  --   --   --  34  --  29 24 22   ALKPHOS  --   --   --  73  --  70 67 70  BILITOT  --   --   --  0.9  --  0.6 0.8 0.6  ALBUMIN 2.5* 2.5*  --  2.3*  --  2.2* 2.4* 2.5*  MG  --   --   --   --   --  2.4  --   --   CRP 14.4*  --   --  20.6*  --   --   --   --  DDIMER 1.86*  --   --  6.00*  --  7.15* 5.31* 3.85*  PROCALCITON  --   --   --  2.68  --   --   --   --   LATICACIDVEN  --   --  1.7  --  1.1  --   --   --     ------------------------------------------------------------------------------------------------------------------ No results for input(s): CHOL, HDL, LDLCALC, TRIG, CHOLHDL, LDLDIRECT in the last 72 hours.  Lab Results  Component Value Date   HGBA1C 6.6 (H) 04/04/2020   ------------------------------------------------------------------------------------------------------------------ No results for input(s): TSH, T4TOTAL, T3FREE, THYROIDAB in the last 72 hours.  Invalid input(s): FREET3  Cardiac Enzymes No results for input(s): CKMB, TROPONINI, MYOGLOBIN in the last 168 hours.  Invalid input(s): CK ------------------------------------------------------------------------------------------------------------------ No results found for: BNP  Micro Results Recent Results (from the past 240 hour(s))  Blood Culture (routine x 2)     Status: None (Preliminary result)   Collection Time: 04/11/20 10:18 AM   Specimen: BLOOD  Result Value Ref Range Status   Specimen Description BLOOD RIGHT ANTECUBITAL  Final   Special Requests   Final    BOTTLES DRAWN AEROBIC ONLY Blood Culture adequate volume   Culture   Final    NO GROWTH 3  DAYS Performed at [facility], [address], [place], [place] [address]    Report Status PENDING  Incomplete  Blood Culture (routine x 2)     Status: None (Preliminary result)   Collection Time: 04/11/20 10:23 AM   Specimen: BLOOD  Result Value Ref Range Status   Specimen Description BLOOD LEFT ANTECUBITAL  Final   Special Requests   Final    BOTTLES DRAWN AEROBIC AND ANAEROBIC Blood Culture adequate volume   Culture   Final    NO GROWTH 3 DAYS Performed at [facility], [place] [address] [place], [place] [address]    Report Status PENDING  Incomplete  MRSA PCR Screening     Status: None   Collection Time: 04/11/20 12:56 PM   Specimen: Nasal Mucosa; Nasopharyngeal  Result Value Ref Range Status   MRSA by PCR NEGATIVE NEGATIVE Final    Comment:        The GeneXpert MRSA Assay (FDA approved for NASAL specimens only), is one component of a comprehensive MRSA colonization surveillance program. It is not intended to diagnose MRSA infection nor to guide or monitor treatment for MRSA infections. Performed at [facility], [place] [address], [place], [place] [address]     Radiology Reports CT ABDOMEN PELVIS WO CONTRAST  Result Date: 04/03/2020 CLINICAL DATA:  Abdominal pain. EXAM: CT ABDOMEN AND PELVIS WITHOUT CONTRAST TECHNIQUE: Multidetector CT imaging of the abdomen and pelvis was performed following the standard protocol without IV contrast. COMPARISON:  02/20/2019 FINDINGS: Lower chest: Patchy ground-glass infiltrates in the right lower lobe could be due to developing pneumonia, atypical pneumonia or acute aspiration. No pleural effusions. The heart is normal in size. No pericardial effusion. Three-vessel coronary artery calcifications are noted. Small hiatal hernia. Hepatobiliary: No hepatic lesions are identified without contrast. No intrahepatic biliary dilatation. The gallbladder is surgically absent. No common bile duct dilatation. Pancreas: Marked fatty  atrophy of the pancreas. No mass, inflammation or ductal dilatation. Spleen: Normal size.  No focal lesions. Adrenals/Urinary Tract: The adrenal glands are unremarkable. Small, chronically atrophied kidneys consistent with chronic renal failure. No worrisome renal lesions or hydroureteronephrosis. The bladder is unremarkable. Stomach/Bowel: The stomach, duodenum, small bowel and colon  are unremarkable. No acute inflammatory changes, mass lesions or obstructive findings. The terminal ileum is normal. Scattered colonic diverticulosis but no findings for acute diverticulitis. Vascular/Lymphatic: Significant diffuse vascular calcifications. No aneurysm. No mesenteric or retroperitoneal mass or adenopathy. No free air or free fluid. No leaking oral contrast. Reproductive: The uterus is surgically absent. Both ovaries are still present and appear normal. Other: Moderate-sized periumbilical abdominal wall hernia containing fat. Musculoskeletal: No significant bony findings. IMPRESSION: 1. No acute abdominal/pelvic findings, mass lesions or adenopathy. 2. Patchy ground-glass infiltrates in the right lower lobe could be due to developing pneumonia, atypical pneumonia or acute aspiration. 3. Status post cholecystectomy. No biliary dilatation. 4. Small, chronically atrophied kidneys consistent with chronic renal failure. 5. Moderate-sized periumbilical abdominal wall hernia containing fat. 6. Aortic atherosclerosis. Aortic Atherosclerosis (ICD10-I70.0). Electronically Signed   By: Marijo Sanes M.D.   On: 04/03/2020 19:19   CT HEAD WO CONTRAST  Result Date: 04/14/2020 CLINICAL DATA:  Delirium.  Altered mental status. EXAM: CT HEAD WITHOUT CONTRAST TECHNIQUE: Contiguous axial images were obtained from the base of the skull through the vertex without intravenous contrast. COMPARISON:  April 03, 2020. FINDINGS: Brain: No evidence of acute large vascular territory infarction, hemorrhage, hydrocephalus, extra-axial  collection or mass lesion/mass effect. Similar cerebral atrophy with ex vacuo ventricular dilation. Mild white matter hypodensities, likely related to chronic microvascular ischemic disease. Vascular: Calcific atherosclerosis. Skull: No acute fracture. Sinuses/Orbits: Left maxillary sinus retention cyst versus polyp. Unremarkable orbits. Other: No mastoid effusions. IMPRESSION: 1. No evidence of acute intracranial abnormality. 2. Similar cerebral atrophy and ventriculomegaly. Electronically Signed   By: Margaretha Sheffield MD   On: 04/14/2020 16:08   CT Head Wo Contrast  Result Date: 04/03/2020 CLINICAL DATA:  Sudden onset of leg weakness today. EXAM: CT HEAD WITHOUT CONTRAST TECHNIQUE: Contiguous axial images were obtained from the base of the skull through the vertex without intravenous contrast. COMPARISON:  None. FINDINGS: Brain: Age advanced cerebral atrophy, ventriculomegaly and mild periventricular white matter disease. No extra-axial fluid collections are identified. No CT findings for acute hemispheric infarction or intracranial hemorrhage. No mass lesions. The brainstem and cerebellum are normal. Vascular: Moderate to advanced vascular calcifications but no aneurysm or hyperdense vessels. Skull: No skull fracture or bone lesions. Sinuses/Orbits: The paranasal sinuses and mastoid air cells are clear except for a 2 cm mucous retention cyst or polyp in the left maxillary sinus. The globes are intact. Other: No scalp lesions or scalp hematoma. IMPRESSION: 1. Age advanced cerebral atrophy, ventriculomegaly and mild periventricular white matter disease. 2. No acute intracranial findings or mass lesions. Electronically Signed   By: Marijo Sanes M.D.   On: 04/03/2020 19:14   US RENAL  Result Date: 04/04/2020 CLINICAL DATA:  Acute kidney injury EXAM: RENAL / URINARY TRACT ULTRASOUND COMPLETE COMPARISON:  CT 04/03/2020 FINDINGS: Technical note: Significantly limited examination secondary to poor penetration  related to patient body habitus and shadowing from overlying bowel gas. Right Kidney: Limited. Echogenic renal parenchyma with cortical thinning compatible with chronic medical renal disease. Right renal length of approximately 10.6 cm. No shadowing stone or hydronephrosis is identified. Left Kidney: Very limited evaluation. Visualized left kidney appears diffusely echogenic with renal cortical thinning compatible with chronic medical renal disease. No obvious hydronephrosis. Bladder: Appears normal for degree of bladder distention. Other: None. IMPRESSION: 1. Limited exam. 2. No sonographic evidence of obstructive uropathy. 3. Atrophic, echogenic kidneys compatible with chronic medical renal disease. Electronically Signed   By: Davina Poke D.O.   On:  04/04/2020 12:18   DG CHEST PORT 1 VIEW  Result Date: 04/11/2020 CLINICAL DATA:  Hemodialysis catheter placement EXAM: PORTABLE CHEST 1 VIEW COMPARISON:  April 11, 2020 FINDINGS: The heart size is enlarged. Bilateral airspace disease is again noted and has progressed in the right mid and right upper lung zones. There is vascular congestion with small bilateral pleural effusions. There is a right-sided central venous catheter with tip projecting over the mid SVC. The catheter fall short of the cavoatrial junction by approximately 6-7 cm. There is no evidence for pneumothorax. Aortic calcifications are noted. The main pulmonary artery is dilated. IMPRESSION: 1. Interval worsening of bilateral airspace disease, greatest in the right mid and right upper lung zones. 2. Hemodialysis catheter as above. While the tip likely terminates in the SVC, a catheter fall short of the cavoatrial junction by at least 6 cm. 3. Persistent cardiomegaly with vascular congestion and small bilateral pleural effusions. Electronically Signed   By: Constance Holster M.D.   On: 04/11/2020 17:23   DG Chest Port 1 View  Result Date: 04/11/2020 CLINICAL DATA:  Shortness of breath.  Cough. Hypoxia. COVID-19 virus infection. EXAM: PORTABLE CHEST 1 VIEW COMPARISON:  04/03/2020 FINDINGS: Heart size remains normal. New mild airspace opacity is seen in the central right upper lobe and peripheral left upper lobe. No evidence of pleural effusion. IMPRESSION: New mild bilateral upper lobe airspace disease, suspicious for viral pneumonia. Electronically Signed   By: Marlaine Hind M.D.   On: 04/11/2020 11:30   DG Chest Port 1 View  Result Date: 04/03/2020 CLINICAL DATA:  Right lower lobe infiltrate EXAM: PORTABLE CHEST 1 VIEW COMPARISON:  Same day CT abdomen pelvis, 04/03/2020, chest radiographs, 04/05/2014 FINDINGS: The heart size and mediastinal contours are within normal limits. Subtle heterogeneous airspace opacity of the right lung base. The visualized skeletal structures are unremarkable. IMPRESSION: Subtle heterogeneous airspace opacity of the right lung base, in keeping with findings of prior CT and consistent with infection or aspiration. Electronically Signed   By: Eddie Candle M.D.   On: 04/03/2020 20:23   ECHOCARDIOGRAM COMPLETE  Result Date: 04/04/2020    ECHOCARDIOGRAM REPORT   Patient Name:   MICHAEL VENTRESCA [place] Date of Exam: 04/04/2020 Medical Rec #:  [id number]     Height:       62.0 in Accession #:    [id number]    Weight:       211.2 lb Date of Birth:  1950-04-27     BSA:          1.956 m Patient Age:    21 years      BP:           146/78 mmHg Patient Gender: F             HR:           82 bpm. Exam Location:  Inpatient Procedure: 2D Echo, Cardiac Doppler, Color Doppler and Intracardiac            Opacification Agent Indications:    Abnormal ECG 794.31 / R94.31  History:        Patient has prior history of Echocardiogram examinations, most                 recent 11/11/1996. CAD, Signs/Symptoms:Shortness of Breath and                 Chest Pain; Risk Factors:Hypertension, Diabetes and  Dyslipidemia.  Sonographer:    Bernadene Person RDCS Referring Phys: [id number] [place]  1. Left ventricular ejection fraction, by estimation, is 60 to 65%. The left ventricle has normal function. The left ventricle has no regional wall motion abnormalities. Left ventricular diastolic parameters are consistent with Grade II diastolic dysfunction (pseudonormalization). Elevated left ventricular end-diastolic pressure.  2. Right ventricular systolic function is normal. The right ventricular size is normal. There is normal pulmonary artery systolic pressure.  3. The mitral valve is degenerative. Trivial mitral valve regurgitation. No evidence of mitral stenosis. Moderate mitral annular calcification.  4. The aortic valve is tricuspid. Aortic valve regurgitation is not visualized. Mild to moderate aortic valve sclerosis/calcification is present, without any evidence of aortic stenosis.  5. The inferior vena cava is normal in size with greater than 50% respiratory variability, suggesting right atrial pressure of 3 mmHg. FINDINGS  Left Ventricle: Left ventricular ejection fraction, by estimation, is 60 to 65%. The left ventricle has normal function. The left ventricle has no regional wall motion abnormalities. Definity contrast agent was given IV to delineate the left ventricular  endocardial borders. The left ventricular internal cavity size was normal in size. There is no left ventricular hypertrophy. Left ventricular diastolic parameters are consistent with Grade II diastolic dysfunction (pseudonormalization). Elevated left ventricular end-diastolic pressure. Right Ventricle: The right ventricular size is normal. No increase in right ventricular wall thickness. Right ventricular systolic function is normal. There is normal pulmonary artery systolic pressure. The tricuspid regurgitant velocity is 2.75 m/s, and  with an assumed right atrial pressure of 3 mmHg, the estimated right ventricular systolic pressure is 71.2 mmHg. Left Atrium: Left atrial size was normal in size. Right  Atrium: Right atrial size was normal in size. Pericardium: There is no evidence of pericardial effusion. Mitral Valve: The mitral valve is degenerative in appearance. There is moderate thickening of the mitral valve leaflet(s). There is moderate calcification of the mitral valve leaflet(s). Moderate mitral annular calcification. Trivial mitral valve regurgitation. No evidence of mitral valve stenosis. Tricuspid Valve: The tricuspid valve is normal in structure. Tricuspid valve regurgitation is mild . No evidence of tricuspid stenosis. Aortic Valve: The aortic valve is tricuspid. Aortic valve regurgitation is not visualized. Mild to moderate aortic valve sclerosis/calcification is present, without any evidence of aortic stenosis. Pulmonic Valve: The pulmonic valve was normal in structure. Pulmonic valve regurgitation is not visualized. No evidence of pulmonic stenosis. Aorta: The aortic root is normal in size and structure. Venous: The inferior vena cava is normal in size with greater than 50% respiratory variability, suggesting right atrial pressure of 3 mmHg. IAS/Shunts: No atrial level shunt detected by color flow Doppler.  LEFT VENTRICLE PLAX 2D LVIDd:         5.80 cm  Diastology LVIDs:         4.10 cm  LV e' medial:    3.81 cm/s LV PW:         0.70 cm  LV E/e' medial:  24.3 LV IVS:        1.00 cm  LV e' lateral:   4.57 cm/s LVOT diam:     2.00 cm  LV E/e' lateral: 20.2 LV SV:         78 LV SV Index:   40 LVOT Area:     3.14 cm  RIGHT VENTRICLE RV S prime:     13.10 cm/s TAPSE (M-mode): 2.0 cm LEFT ATRIUM  Index       RIGHT ATRIUM           Index LA diam:        3.40 cm 1.74 cm/m  RA Area:     18.10 cm LA Vol (A2C):   56.0 ml 28.62 ml/m RA Volume:   47.40 ml  24.23 ml/m LA Vol (A4C):   51.1 ml 26.12 ml/m LA Biplane Vol: 53.4 ml 27.29 ml/m  AORTIC VALVE LVOT Vmax:   104.00 cm/s LVOT Vmean:  86.300 cm/s LVOT VTI:    0.249 m  AORTA Ao Root diam: 3.30 cm Ao Asc diam:  3.60 cm MITRAL VALVE                 TRICUSPID VALVE MV Area (PHT): 2.48 cm     TR Peak grad:   30.2 mmHg MV Decel Time: 306 msec     TR Vmax:        275.00 cm/s MV E velocity: 92.40 cm/s MV A velocity: 138.00 cm/s  SHUNTS MV E/A ratio:  0.67         Systemic VTI:  0.25 m                             Systemic Diam: 2.00 cm Jenkins Rouge MD Electronically signed by Jenkins Rouge MD Signature Date/Time: 04/04/2020/11:23:04 AM    Final    VAS [place] LOWER EXTREMITY VENOUS (DVT)  Result Date: 04/13/2020  Lower Venous DVT Study Indications: Covid +, elevated D dimer.  Limitations: Body habitus, cooperation and poor ultrasound/tissue interface. Performing Technologist: Antonieta Pert RDMS, RVT  Examination Guidelines: A complete evaluation includes B-mode imaging, spectral Doppler, color Doppler, and power Doppler as needed of all accessible portions of each vessel. Bilateral testing is considered an integral part of a complete examination. Limited examinations for reoccurring indications may be performed as noted. The reflux portion of the exam is performed with the patient in reverse Trendelenburg.  +---------+---------------+---------+-----------+----------+--------------+ RIGHT    CompressibilityPhasicitySpontaneityPropertiesThrombus Aging +---------+---------------+---------+-----------+----------+--------------+ CFV      Full           Yes      Yes                                 +---------+---------------+---------+-----------+----------+--------------+ SFJ      Full                                                        +---------+---------------+---------+-----------+----------+--------------+ FV Prox  Full                                                        +---------+---------------+---------+-----------+----------+--------------+ FV Mid   Full                                                        +---------+---------------+---------+-----------+----------+--------------+ FV DistalFull                                                         +---------+---------------+---------+-----------+----------+--------------+  PFV      Full                                                        +---------+---------------+---------+-----------+----------+--------------+ POP      Full           Yes      Yes                                 +---------+---------------+---------+-----------+----------+--------------+ PTV      Full                                                        +---------+---------------+---------+-----------+----------+--------------+ PERO     Partial                            dilated   Acute          +---------+---------------+---------+-----------+----------+--------------+ GSV      Full                                                        +---------+---------------+---------+-----------+----------+--------------+   +---------+---------------+---------+-----------+----------+--------------+ LEFT     CompressibilityPhasicitySpontaneityPropertiesThrombus Aging +---------+---------------+---------+-----------+----------+--------------+ CFV      Full           Yes      Yes                                 +---------+---------------+---------+-----------+----------+--------------+ SFJ      Full                                                        +---------+---------------+---------+-----------+----------+--------------+ FV Prox  Full                                                        +---------+---------------+---------+-----------+----------+--------------+ FV Mid   Full                                                        +---------+---------------+---------+-----------+----------+--------------+ FV DistalFull                                                        +---------+---------------+---------+-----------+----------+--------------+  PFV      Full                                                         +---------+---------------+---------+-----------+----------+--------------+ POP      Full           Yes      Yes                                 +---------+---------------+---------+-----------+----------+--------------+ PTV      Full                                                        +---------+---------------+---------+-----------+----------+--------------+ PERO     Full                                                        +---------+---------------+---------+-----------+----------+--------------+ GSV      Full                                                        +---------+---------------+---------+-----------+----------+--------------+     Summary: RIGHT: - Findings consistent with acute deep vein thrombosis involving the right peroneal veins. - No cystic structure found in the popliteal fossa. - Small segment of thrombus in the right calf.  LEFT: - There is no evidence of deep vein thrombosis in the lower extremity. However, portions of this examination were limited- see technologist comments above.  - No cystic structure found in the popliteal fossa. - Difficult exam bilaterally due to large body habitus, poor penetration and poor patient cooperation.  *See table(s) above for measurements and observations. Electronically signed by Harold Barban MD on 04/13/2020 at 6:07:50 PM.    Final

## 2020-04-14 NOTE — Progress Notes (Signed)
Pt's A.M CBG was 407. Dr. Waldron Labs notified. Patient not exhibiting any hyperglycemic symptoms.   Per SSI gave 6 units Novolog. Per Dr. Waldron Labs one-time 15 units Novolog given. Lantus 10 units ordered and given.   Patient took a few bites of breakfast with assist from the nurse tech and is now working with PT/OT.

## 2020-04-15 LAB — CBC WITH DIFFERENTIAL/PLATELET
Abs Immature Granulocytes: 0.27 10*3/uL — ABNORMAL HIGH (ref 0.00–0.07)
Basophils Absolute: 0 10*3/uL (ref 0.0–0.1)
Basophils Relative: 0 %
Eosinophils Absolute: 0 10*3/uL (ref 0.0–0.5)
Eosinophils Relative: 0 %
HCT: 30.3 % — ABNORMAL LOW (ref 36.0–46.0)
Hemoglobin: 10.5 g/dL — ABNORMAL LOW (ref 12.0–15.0)
Immature Granulocytes: 3 %
Lymphocytes Relative: 7 %
Lymphs Abs: 0.7 10*3/uL (ref 0.7–4.0)
MCH: 29.2 pg (ref 26.0–34.0)
MCHC: 34.7 g/dL (ref 30.0–36.0)
MCV: 84.4 fL (ref 80.0–100.0)
Monocytes Absolute: 0.5 10*3/uL (ref 0.1–1.0)
Monocytes Relative: 5 %
Neutro Abs: 8.9 10*3/uL — ABNORMAL HIGH (ref 1.7–7.7)
Neutrophils Relative %: 85 %
Platelets: 256 10*3/uL (ref 150–400)
RBC: 3.59 MIL/uL — ABNORMAL LOW (ref 3.87–5.11)
RDW: 15.3 % (ref 11.5–15.5)
WBC: 10.4 10*3/uL (ref 4.0–10.5)
nRBC: 0 % (ref 0.0–0.2)

## 2020-04-15 LAB — COMPREHENSIVE METABOLIC PANEL
ALT: 22 U/L (ref 0–44)
AST: 25 U/L (ref 15–41)
Albumin: 2.5 g/dL — ABNORMAL LOW (ref 3.5–5.0)
Alkaline Phosphatase: 60 U/L (ref 38–126)
Anion gap: 12 (ref 5–15)
BUN: 49 mg/dL — ABNORMAL HIGH (ref 8–23)
CO2: 24 mmol/L (ref 22–32)
Calcium: 8 mg/dL — ABNORMAL LOW (ref 8.9–10.3)
Chloride: 103 mmol/L (ref 98–111)
Creatinine, Ser: 3.89 mg/dL — ABNORMAL HIGH (ref 0.44–1.00)
GFR, Estimated: 12 mL/min — ABNORMAL LOW (ref >60)
Glucose, Bld: 175 mg/dL — ABNORMAL HIGH (ref 70–99)
Potassium: 3.9 mmol/L (ref 3.5–5.1)
Sodium: 139 mmol/L (ref 135–145)
Total Bilirubin: 0.9 mg/dL (ref 0.3–1.2)
Total Protein: 5.4 g/dL — ABNORMAL LOW (ref 6.5–8.1)

## 2020-04-15 LAB — GLUCOSE, CAPILLARY
Glucose-Capillary: 161 mg/dL — ABNORMAL HIGH (ref 70–99)
Glucose-Capillary: 191 mg/dL — ABNORMAL HIGH (ref 70–99)
Glucose-Capillary: 191 mg/dL — ABNORMAL HIGH (ref 70–99)
Glucose-Capillary: 167 mg/dL — ABNORMAL HIGH (ref 70–99)

## 2020-04-15 MED ORDER — CHLORHEXIDINE GLUCONATE CLOTH 2 % EX PADS
6.0000 | MEDICATED_PAD | Freq: Every day | CUTANEOUS | Status: DC
  Administered 2020-04-16 – 2020-04-19 (×3): 6 via TOPICAL

## 2020-04-15 MED ORDER — DARBEPOETIN ALFA 60 MCG/0.3ML IJ SOSY: 60.0000 ug | PREFILLED_SYRINGE | INTRAMUSCULAR | Status: DC

## 2020-04-15 NOTE — Progress Notes (Signed)
Patient ID: PAYTIN RAMAKRISHNAN, female   DOB: 1950-06-02, 69 y.o.   MRN: [id number]   S: HD overnight for confusion, no volume removal- tolerated well hemodynamically - UOP not well recorded - on room air   O:BP (!) 167/73 (BP Location: Right Arm)   Pulse 75   Temp 97.8 F (36.6 C) (Oral)   Resp 12   Wt 100.3 kg   SpO2 96%   BMI 40.44 kg/m   Intake/Output Summary (Last 24 hours) at 04/15/2020 1118 Last data filed at 04/15/2020 0200 Gross per 24 hour  Intake 20 ml  Output 0 ml  Net 20 ml   Intake/Output: I/O last 3 completed shifts: In: 500 [P.O.:500] Out: 250 [Urine:250]  Intake/Output this shift:  No intake/output data recorded. Weight change:  Not examined-  Noted reviewed   Recent Labs  Lab 04/09/20 0407 04/11/20 1032 04/12/20 0416 04/13/20 0839 04/14/20 0145 04/15/20 0624  NA 142 141 142 142 140 139  K 3.9 4.6 5.0 4.4 4.4 3.9  CL 110 109 110 104 105 103  CO2 16* 14* 15* 18* 19* 24  GLUCOSE 121* 203* 212* 274* 397* 175*  BUN 86* 103* 111* 80* 91* 49*  CREATININE 6.26* 8.99* 9.47* 6.80* 6.78* 3.89*  ALBUMIN 2.5* 2.3* 2.2* 2.4* 2.5* 2.5*  CALCIUM 8.0* 8.5* 8.7* 8.7* 8.4* 8.0*  PHOS 5.5*  --  6.6*  --   --   --   AST  --  52* 37 27 21 25   ALT  --  34 29 24 22 22    Liver Function Tests: Recent Labs  Lab 04/13/20 0839 04/14/20 0145 04/15/20 0624  AST 27 21 25   ALT 24 22 22   ALKPHOS 67 70 60  BILITOT 0.8 0.6 0.9  PROT 5.8* 5.7* 5.4*  ALBUMIN 2.4* 2.5* 2.5*   No results for input(s): LIPASE, AMYLASE in the last 168 hours. No results for input(s): AMMONIA in the last 168 hours. CBC: Recent Labs  Lab 04/11/20 1712 04/12/20 0416 04/13/20 0839 04/14/20 0145 04/15/20 0624  WBC 10.7* 10.0 10.3 9.7 10.4  NEUTROABS  --  8.6* 9.2* 8.7* 8.9*  HGB 8.0* 7.7* 9.6* 10.7* 10.5*  HCT 26.2* 25.0* 30.0* 32.5* 30.3*  MCV 91.9 91.2 87.0 88.6 84.4  PLT 286 306 266 275 256   Cardiac Enzymes: No results for input(s): CKTOTAL, CKMB, CKMBINDEX, TROPONINI in the last 168  hours. CBG: Recent Labs  Lab 04/14/20 0731 04/14/20 1209 04/14/20 1739 04/14/20 2104 04/15/20 0752  GLUCAP 407* 288* 203* 228* 161*    Iron Studies:  No results for input(s): IRON, TIBC, TRANSFERRIN, FERRITIN in the last 72 hours. Studies/Results: CT HEAD WO CONTRAST  Result Date: 04/14/2020 CLINICAL DATA:  Delirium.  Altered mental status. EXAM: CT HEAD WITHOUT CONTRAST TECHNIQUE: Contiguous axial images were obtained from the base of the skull through the vertex without intravenous contrast. COMPARISON:  April 03, 2020. FINDINGS: Brain: No evidence of acute large vascular territory infarction, hemorrhage, hydrocephalus, extra-axial collection or mass lesion/mass effect. Similar cerebral atrophy with ex vacuo ventricular dilation. Mild white matter hypodensities, likely related to chronic microvascular ischemic disease. Vascular: Calcific atherosclerosis. Skull: No acute fracture. Sinuses/Orbits: Left maxillary sinus retention cyst versus polyp. Unremarkable orbits. Other: No mastoid effusions. IMPRESSION: 1. No evidence of acute intracranial abnormality. 2. Similar cerebral atrophy and ventriculomegaly. Electronically Signed   By: Margaretha Sheffield MD   On: 04/14/2020 16:08   VAS [place] LOWER EXTREMITY VENOUS (DVT)  Result Date: 04/13/2020  Lower Venous DVT Study  Indications: Covid +, elevated D dimer.  Limitations: Body habitus, cooperation and poor ultrasound/tissue interface. Performing Technologist: Antonieta Pert RDMS, RVT  Examination Guidelines: A complete evaluation includes B-mode imaging, spectral Doppler, color Doppler, and power Doppler as needed of all accessible portions of each vessel. Bilateral testing is considered an integral part of a complete examination. Limited examinations for reoccurring indications may be performed as noted. The reflux portion of the exam is performed with the patient in reverse Trendelenburg.   +---------+---------------+---------+-----------+----------+--------------+ RIGHT    CompressibilityPhasicitySpontaneityPropertiesThrombus Aging +---------+---------------+---------+-----------+----------+--------------+ CFV      Full           Yes      Yes                                 +---------+---------------+---------+-----------+----------+--------------+ SFJ      Full                                                        +---------+---------------+---------+-----------+----------+--------------+ FV Prox  Full                                                        +---------+---------------+---------+-----------+----------+--------------+ FV Mid   Full                                                        +---------+---------------+---------+-----------+----------+--------------+ FV DistalFull                                                        +---------+---------------+---------+-----------+----------+--------------+ PFV      Full                                                        +---------+---------------+---------+-----------+----------+--------------+ POP      Full           Yes      Yes                                 +---------+---------------+---------+-----------+----------+--------------+ PTV      Full                                                        +---------+---------------+---------+-----------+----------+--------------+ PERO     Partial  dilated   Acute          +---------+---------------+---------+-----------+----------+--------------+ GSV      Full                                                        +---------+---------------+---------+-----------+----------+--------------+   +---------+---------------+---------+-----------+----------+--------------+ LEFT     CompressibilityPhasicitySpontaneityPropertiesThrombus Aging  +---------+---------------+---------+-----------+----------+--------------+ CFV      Full           Yes      Yes                                 +---------+---------------+---------+-----------+----------+--------------+ SFJ      Full                                                        +---------+---------------+---------+-----------+----------+--------------+ FV Prox  Full                                                        +---------+---------------+---------+-----------+----------+--------------+ FV Mid   Full                                                        +---------+---------------+---------+-----------+----------+--------------+ FV DistalFull                                                        +---------+---------------+---------+-----------+----------+--------------+ PFV      Full                                                        +---------+---------------+---------+-----------+----------+--------------+ POP      Full           Yes      Yes                                 +---------+---------------+---------+-----------+----------+--------------+ PTV      Full                                                        +---------+---------------+---------+-----------+----------+--------------+ PERO     Full                                                        +---------+---------------+---------+-----------+----------+--------------+  GSV      Full                                                        +---------+---------------+---------+-----------+----------+--------------+     Summary: RIGHT: - Findings consistent with acute deep vein thrombosis involving the right peroneal veins. - No cystic structure found in the popliteal fossa. - Small segment of thrombus in the right calf.  LEFT: - There is no evidence of deep vein thrombosis in the lower extremity. However, portions of this examination were limited- see  technologist comments above.  - No cystic structure found in the popliteal fossa. - Difficult exam bilaterally due to large body habitus, poor penetration and poor patient cooperation.  *See table(s) above for measurements and observations. Electronically signed by Harold Barban MD on 04/13/2020 at 6:07:50 PM.    Final    . sodium chloride   Intravenous Once  . apixaban  10 mg Oral BID   Followed by  . [START ON 04/20/2020] apixaban  5 mg Oral BID  . vitamin C  500 mg Oral Daily  . Chlorhexidine Gluconate Cloth  6 each Topical Q0600  . hydrALAZINE  50 mg Oral TID  . insulin aspart  0-6 Units Subcutaneous TID WC  . insulin detemir  10 Units Subcutaneous Daily  . Ipratropium-Albuterol  1 puff Inhalation Q6H  . metoprolol tartrate  25 mg Oral BID  . nystatin   Topical BID  . pantoprazole  20 mg Oral Daily  . pravastatin  40 mg Oral QPM  . rOPINIRole  0.25 mg Oral QPM  . sodium bicarbonate  650 mg Oral BID    BMET    Component Value Date/Time   NA 139 04/15/2020 0624   K 3.9 04/15/2020 0624   CL 103 04/15/2020 0624   CO2 24 04/15/2020 0624   GLUCOSE 175 (H) 04/15/2020 0624   BUN 49 (H) 04/15/2020 0624   CREATININE 3.89 (H) 04/15/2020 0624   CREATININE 1.53 (H) 09/28/2011 0945   CALCIUM 8.0 (L) 04/15/2020 0624   CALCIUM 9.6 11/01/2009 1543   GFRNONAA 12 (L) 04/15/2020 0624   GFRNONAA 36 (L) 09/28/2011 0945   GFRAA 21 (L) 02/27/2019 0548   GFRAA 42 (L) 09/28/2011 0945   CBC    Component Value Date/Time   WBC 10.4 04/15/2020 0624   RBC 3.59 (L) 04/15/2020 0624   HGB 10.5 (L) 04/15/2020 0624   HCT 30.3 (L) 04/15/2020 0624   HCT 31.2 (L) 02/21/2019 0018   PLT 256 04/15/2020 0624   MCV 84.4 04/15/2020 0624   MCH 29.2 04/15/2020 0624   MCHC 34.7 04/15/2020 0624   RDW 15.3 04/15/2020 0624   LYMPHSABS 0.7 04/15/2020 0624   MONOABS 0.5 04/15/2020 0624   EOSABS 0.0 04/15/2020 0624   BASOSABS 0.0 04/15/2020 0624      Assessment and plan: 1. AKI/CKD stage IV- (baseline Scr  2.7-3.3) rising bun/creat in setting of covid-19 infection. S/p first HD 04/12/20 due - had second treatement 12/22.    1. Had 1st HD session on 04/12/20 and BUN/Cr stable since, however poor documentation of UOP. 2. Was going to hold HD and follow UOP and labs, however after discussing case with Dr. Waldron Labs did HD 12/22 and see if her confusion improves with further dialysis. 3. No HD needs today, may need  to do 12/24 as dialysis will be unavailable on 12/25 2. covid-19 infection- per primary team. On room air 3. HTN/Volume- does not appear to be volume overloaded per hosp note 4. CAD- stable 5. Right peroneal DVT- started on Eliquis per primary 6. Anemia of CKD stage IV- low Hgb at 7.7. Start ESA and follow H/H and iron stores. Transfuse prn. 7. Vascular access- RIJ temp HD catheter placed 04/11/20. Had been referred to [facility] for access placement. Will likely need tunneled HD catheter if no significant improvement of UOP and Scr.   Louis Meckel  [employer] [phone]

## 2020-04-15 NOTE — Plan of Care (Signed)

## 2020-04-15 NOTE — Progress Notes (Signed)
PROGRESS NOTE                                                                             PROGRESS NOTE                                                                                                                                                                                                             Patient Demographics:    Ann [name], is a 69 y.o. female, DOB - 05-21-1950, ZOX:[id number]  Outpatient Primary MD for the patient is [name], MD    LOS - 4  Admit date - 04/11/2020    Chief Complaint  Patient presents with  . Covid Positive       Brief Narrative    HPI: Ann [name] is a 69 y.o. female with history h/o stage IV CKD with associated anemia, CAD, T2DM, HTN, HLD, GERD, asthma, OSA on CPAP, chronic lumbar back pain who was recently admitted 12/11-12/17/21 to [facility] for COVID-19 related GI/resp symptoms treated with Remdesevir ( steroids not given as no hypoxia throughout the stay) presents to ED today with worsening symptoms. In the ED 12/11, she was felt to be dehydrated in addition to CKD baseline creatinine 2.7-3.3, creatinine was 5.3 on admission,did peak at 6.5,  plateaued around 6.2 range for 3 days prior to discharge. Diuretics and ACEiwere held on discharge and nephrology follow up arranged. She was deconditioned at discharge but declined SNF , went home with [facility].  Per daughter, patient has been eating poorly and sleeping most of the time since last hospital discharge.  She also appeared to be having labored breathing and unable to perform any of her ADLs.  Daughter asking if staff here can help her take a bath as she has been unable to get her to the bathroom.  Patient currently oriented to self/person only but not to place or time.  She is unable to provide any details regarding hospital visit but does report productive cough and shortness of breath.  ED findings today: Afebrile, SBP  1 20-160, RR 15-19, hypoxic on arrival  requiring NRB M transiently-transitioned to 3 L O2 now.  Lab work shows worsening renal function with BUN 86->103, creatinine 6.26->8.99, bicarb 14, potassium 4.6, albumin 2.6->2.3.  CRP 20.6, procalcitonin 2.68, lactate 1.7.  Ferritin 1320, LDH 281, triglyceride 134, D-dimer 2.6->6.0 WBC 10.3, hemoglobin 9.0, platelet 321.  Chest x-ray shows "new mild bilateral upper lobe airspace disease, suspicious for viral pneumonia."  She received 10 mg IV Decadron x1 and on IV fluids NS at 100 mL/h.  Per EDP, nephrology has been consulted and patient requested to be admitted for further evaluation and management.    Subjective:    Vienne Masterson today confusion has improved, patient herself denies any complaints, no significant events as discussed with staff.  Remains confused, but she is more awake and alert this morning , she denies any complaints. ..     Assessment  & Plan :    Principal Problem:   Acute respiratory failure with hypoxia (HCC) Active Problems:   HYPERTENSION   CAD (coronary artery disease)   Obesity, Class III, BMI 40-49.9 (morbid obesity) ([place])   Acute encephalopathy   Chronic pain   2019 novel coronavirus disease (COVID-19)   GERD (gastroesophageal reflux disease)   AKI (acute kidney injury) ([place])  Acute Hypoxic Resp. Failure due to Acute Covid 19 Viral Pneumonitis during the ongoing 2020 Covid 19 Pandemic -  -She is unvaccinated -He was treated with steroids and Remdesivir during recent hospitalization, was started on steroids, no hypoxia, steroid has been stopped -Received Actemra on admission. -Was encouraged to use incentive spirometry and flutter valve.  Acute DVT -Elevated d D-dimers, venous Doppler was obtained she is having right peroneal veins DVT, given her Covid infection, and hypercoagulable state, high risk for VTE, continue with full anticoagulation with Eliquis.  Progressive AKI on CKD stage IV:  -Management per renal, started on hemodialysis this hospital  stay, she was dialyzed overnight given significant encephalopathy  Acute metabolic encephalopathy: - Likely related to significant uremia (greater than 100) and COVID-19 infection.  Patient has been improving gradually, CT head with no acute findings.  Difficult improvement this morning after she was dialyzed overnight  Diabetes mellitus type 2:  -BG currently acceptable on Levemir and sliding scale   Anemia -The setting of chronic kidney disease, she has 1 unit PRBC 12/20 with good response.  CAD: Resume home medications, aspirin, beta-blockers and statins.  Hypertension:  -Resume home medications, creased home dose Norvasc to 5 mg and titrate as needed.  Diuretics/ACE inhibitors held in last admission  Chronic pain: Hold Neurontin/Cymbalta for now until mental status improves.  GERD: Resume PPI unless otherwise advised by nephrology.  Obesity class III BMI 40-49.9: Follow-up PCP, weight modifying recommendations upon discharge    SpO2: 96 % O2 Flow Rate (L/min): 3 L/min  Recent Labs  Lab 04/11/20 1031 04/11/20 1032 04/11/20 1712 04/12/20 0416 04/13/20 0839 04/14/20 0145 04/15/20 0624  WBC  --  10.3 10.7* 10.0 10.3 9.7 10.4  PLT  --  321 286 306 266 275 256  CRP  --  20.6*  --   --   --   --   --   DDIMER  --  6.00*  --  7.15* 5.31* 3.85*  --   PROCALCITON  --  2.68  --   --   --   --   --   AST  --  52*  --  37 27 21 25   ALT  --  34  --  29 24 22 22   ALKPHOS  --  73  --  70 67 70 60  BILITOT  --  0.9  --  0.6 0.8 0.6 0.9  ALBUMIN  --  2.3*  --  2.2* 2.4* 2.5* 2.5*  LATICACIDVEN 1.7  --  1.1  --   --   --   --        ABG     Component Value Date/Time   TCO2 20 09/27/2008 1636       Condition - Extremely Guarded  Family Communication  : Discussed with daughter Ann [name] by phone [id number]/7902, 12/22, left voicemail 12/23    Code Status :  Full  Consults  :  Renal, PCCM  Procedures  : A hemodialysis catheter placement by PCCM 12/19, 1 unit PRBC  transfusion on 12/20  Disposition Plan  :    Status is: Inpatient  Remains inpatient appropriate because:Hemodynamically unstable   Dispo: The patient is from: Home              Anticipated d/c is to: SNF              Anticipated d/c date is: > 3 days              Patient currently is not medically stable to d/c.      DVT Prophylaxis  :  Heparin>>Eliquis  Lab Results  Component Value Date   PLT 256 04/15/2020    Diet :  Diet Order            Diet renal/carb modified with fluid restriction Diet-HS Snack? Nothing; Fluid restriction: 1200 mL Fluid; Room service appropriate? No; Fluid consistency: Thin  Diet effective now                  Inpatient Medications  Scheduled Meds: . sodium chloride   Intravenous Once  . apixaban  10 mg Oral BID   Followed by  . [START ON 04/20/2020] apixaban  5 mg Oral BID  . vitamin C  500 mg Oral Daily  . Chlorhexidine Gluconate Cloth  6 each Topical Q0600  . [START ON 04/16/2020] Chlorhexidine Gluconate Cloth  6 each Topical Q0600  . [START ON 04/16/2020] darbepoetin (ARANESP) injection - DIALYSIS  60 mcg Intravenous Q Fri-HD  . hydrALAZINE  50 mg Oral TID  . insulin aspart  0-6 Units Subcutaneous TID WC  . insulin detemir  10 Units Subcutaneous Daily  . Ipratropium-Albuterol  1 puff Inhalation Q6H  . metoprolol tartrate  25 mg Oral BID  . nystatin   Topical BID  . pantoprazole  20 mg Oral Daily  . pravastatin  40 mg Oral QPM  . rOPINIRole  0.25 mg Oral QPM  . sodium bicarbonate  650 mg Oral BID   Continuous Infusions:  PRN Meds:.albuterol, bisacodyl, guaiFENesin-dextromethorphan, ondansetron **OR** ondansetron (ZOFRAN) IV  Antibiotics  :    Anti-infectives (From admission, onward)   Start     Dose/Rate Route Frequency Ordered Stop   04/11/20 1300  cefTRIAXone (ROCEPHIN) 1 g in sodium chloride 0.9 % 100 mL IVPB  Status:  Discontinued        1 g 200 mL/hr over 30 Minutes Intravenous Every 24 hours 04/11/20 1252 04/13/20  1418   04/11/20 1300  doxycycline (VIBRA-TABS) tablet 100 mg  Status:  Discontinued        100 mg Oral Every 12 hours 04/11/20 1252 04/13/20 1418        Anthonia Monger  Elene Downum M.D on 04/15/2020 at 3:08 PM  To page go to [email or web address]  [facility] -  Office  [phone]       Objective:   Vitals:   04/15/20 0240 04/15/20 0400 04/15/20 0755 04/15/20 1200  BP: (!) 171/77 (!) 169/77 (!) 167/73 (!) 164/76  Pulse: 79 75 75 63  Resp: 15 15 12 14   Temp: (!) 97.1 F (36.2 C) 98 F (36.7 C) 97.8 F (36.6 C) 97.8 F (36.6 C)  TempSrc: Oral Oral Oral Oral  SpO2: 96% 97% 96% 96%  Weight:        Wt Readings from Last 3 Encounters:  04/15/20 100.3 kg  04/09/20 99 kg  02/25/19 115 kg     Intake/Output Summary (Last 24 hours) at 04/15/2020 1508 Last data filed at 04/15/2020 0900 Gross per 24 hour  Intake 360 ml  Output 0 ml  Net 360 ml     Physical Exam  Is more awake, coherent and appropriate today, she is oriented x2, answering questions appropriately Symmetrical Chest wall movement, Good air movement bilaterally, CTAB RRR,No Gallops,Rubs or new Murmurs, No Parasternal Heave +ve B.Sounds, Abd Soft, No tenderness, No rebound - guarding or rigidity. No Cyanosis, Clubbing or edema, No new Rash or bruise       Data Review:    CBC Recent Labs  Lab 04/11/20 1032 04/11/20 1712 04/12/20 0416 04/13/20 0839 04/14/20 0145 04/15/20 0624  WBC 10.3 10.7* 10.0 10.3 9.7 10.4  HGB 9.0* 8.0* 7.7* 9.6* 10.7* 10.5*  HCT 29.7* 26.2* 25.0* 30.0* 32.5* 30.3*  PLT 321 286 306 266 275 256  MCV 91.4 91.9 91.2 87.0 88.6 84.4  MCH 27.7 28.1 28.1 27.8 29.2 29.2  MCHC 30.3 30.5 30.8 32.0 32.9 34.7  RDW 16.2* 16.3* 16.2* 15.5 15.8* 15.3  LYMPHSABS 1.0  --  0.6* 0.5* 0.4* 0.7  MONOABS 0.8  --  0.2 0.3 0.3 0.5  EOSABS 0.1  --  0.0 0.0 0.0 0.0  BASOSABS 0.0  --  0.0 0.0 0.0 0.0    Recent Labs  Lab 04/11/20 1031 04/11/20 1032 04/11/20 1712 04/12/20 0416 04/13/20 0839  04/14/20 0145 04/15/20 0624  NA  --  141  --  142 142 140 139  K  --  4.6  --  5.0 4.4 4.4 3.9  CL  --  109  --  110 104 105 103  CO2  --  14*  --  15* 18* 19* 24  GLUCOSE  --  203*  --  212* 274* 397* 175*  BUN  --  103*  --  111* 80* 91* 49*  CREATININE  --  8.99*  --  9.47* 6.80* 6.78* 3.89*  CALCIUM  --  8.5*  --  8.7* 8.7* 8.4* 8.0*  AST  --  52*  --  37 27 21 25   ALT  --  34  --  29 24 22 22   ALKPHOS  --  73  --  70 67 70 60  BILITOT  --  0.9  --  0.6 0.8 0.6 0.9  ALBUMIN  --  2.3*  --  2.2* 2.4* 2.5* 2.5*  MG  --   --   --  2.4  --   --   --   CRP  --  20.6*  --   --   --   --   --   DDIMER  --  6.00*  --  7.15* 5.31* 3.85*  --   PROCALCITON  --  2.68  --   --   --   --   --   LATICACIDVEN 1.7  --  1.1  --   --   --   --     ------------------------------------------------------------------------------------------------------------------ No results for input(s): CHOL, HDL, LDLCALC, TRIG, CHOLHDL, LDLDIRECT in the last 72 hours.  Lab Results  Component Value Date   HGBA1C 6.6 (H) 04/04/2020   ------------------------------------------------------------------------------------------------------------------ No results for input(s): TSH, T4TOTAL, T3FREE, THYROIDAB in the last 72 hours.  Invalid input(s): FREET3  Cardiac Enzymes No results for input(s): CKMB, TROPONINI, MYOGLOBIN in the last 168 hours.  Invalid input(s): CK ------------------------------------------------------------------------------------------------------------------ No results found for: BNP  Micro Results Recent Results (from the past 240 hour(s))  Blood Culture (routine x 2)     Status: None (Preliminary result)   Collection Time: 04/11/20 10:18 AM   Specimen: BLOOD  Result Value Ref Range Status   Specimen Description BLOOD RIGHT ANTECUBITAL  Final   Special Requests   Final    BOTTLES DRAWN AEROBIC ONLY Blood Culture adequate volume   Culture   Final    NO GROWTH 4 DAYS Performed at  [facility], [address], [place], [place] [address]    Report Status PENDING  Incomplete  Blood Culture (routine x 2)     Status: None (Preliminary result)   Collection Time: 04/11/20 10:23 AM   Specimen: BLOOD  Result Value Ref Range Status   Specimen Description BLOOD LEFT ANTECUBITAL  Final   Special Requests   Final    BOTTLES DRAWN AEROBIC AND ANAEROBIC Blood Culture adequate volume   Culture   Final    NO GROWTH 4 DAYS Performed at [facility], [place] [address] [place], [place] [address]    Report Status PENDING  Incomplete  MRSA PCR Screening     Status: None   Collection Time: 04/11/20 12:56 PM   Specimen: Nasal Mucosa; Nasopharyngeal  Result Value Ref Range Status   MRSA by PCR NEGATIVE NEGATIVE Final    Comment:        The GeneXpert MRSA Assay (FDA approved for NASAL specimens only), is one component of a comprehensive MRSA colonization surveillance program. It is not intended to diagnose MRSA infection nor to guide or monitor treatment for MRSA infections. Performed at [facility], [place] [address] [place], [place] [address]     Radiology Reports CT ABDOMEN PELVIS WO CONTRAST  Result Date: 04/03/2020 CLINICAL DATA:  Abdominal pain. EXAM: CT ABDOMEN AND PELVIS WITHOUT CONTRAST TECHNIQUE: Multidetector CT imaging of the abdomen and pelvis was performed following the standard protocol without IV contrast. COMPARISON:  02/20/2019 FINDINGS: Lower chest: Patchy ground-glass infiltrates in the right lower lobe could be due to developing pneumonia, atypical pneumonia or acute aspiration. No pleural effusions. The heart is normal in size. No pericardial effusion. Three-vessel coronary artery calcifications are noted. Small hiatal hernia. Hepatobiliary: No hepatic lesions are identified without contrast. No intrahepatic biliary dilatation. The gallbladder is surgically absent. No common bile duct dilatation. Pancreas: Marked fatty atrophy of the pancreas.  No mass, inflammation or ductal dilatation. Spleen: Normal size.  No focal lesions. Adrenals/Urinary Tract: The adrenal glands are unremarkable. Small, chronically atrophied kidneys consistent with chronic renal failure. No worrisome renal lesions or hydroureteronephrosis. The bladder is unremarkable. Stomach/Bowel: The stomach, duodenum, small bowel and colon are unremarkable. No acute inflammatory changes, mass lesions or obstructive findings. The terminal ileum is normal. Scattered colonic diverticulosis but no findings for acute diverticulitis. Vascular/Lymphatic:  Significant diffuse vascular calcifications. No aneurysm. No mesenteric or retroperitoneal mass or adenopathy. No free air or free fluid. No leaking oral contrast. Reproductive: The uterus is surgically absent. Both ovaries are still present and appear normal. Other: Moderate-sized periumbilical abdominal wall hernia containing fat. Musculoskeletal: No significant bony findings. IMPRESSION: 1. No acute abdominal/pelvic findings, mass lesions or adenopathy. 2. Patchy ground-glass infiltrates in the right lower lobe could be due to developing pneumonia, atypical pneumonia or acute aspiration. 3. Status post cholecystectomy. No biliary dilatation. 4. Small, chronically atrophied kidneys consistent with chronic renal failure. 5. Moderate-sized periumbilical abdominal wall hernia containing fat. 6. Aortic atherosclerosis. Aortic Atherosclerosis (ICD10-I70.0). Electronically Signed   By: Marijo Sanes M.D.   On: 04/03/2020 19:19   CT HEAD WO CONTRAST  Result Date: 04/14/2020 CLINICAL DATA:  Delirium.  Altered mental status. EXAM: CT HEAD WITHOUT CONTRAST TECHNIQUE: Contiguous axial images were obtained from the base of the skull through the vertex without intravenous contrast. COMPARISON:  April 03, 2020. FINDINGS: Brain: No evidence of acute large vascular territory infarction, hemorrhage, hydrocephalus, extra-axial collection or mass lesion/mass  effect. Similar cerebral atrophy with ex vacuo ventricular dilation. Mild white matter hypodensities, likely related to chronic microvascular ischemic disease. Vascular: Calcific atherosclerosis. Skull: No acute fracture. Sinuses/Orbits: Left maxillary sinus retention cyst versus polyp. Unremarkable orbits. Other: No mastoid effusions. IMPRESSION: 1. No evidence of acute intracranial abnormality. 2. Similar cerebral atrophy and ventriculomegaly. Electronically Signed   By: Margaretha Sheffield MD   On: 04/14/2020 16:08   CT Head Wo Contrast  Result Date: 04/03/2020 CLINICAL DATA:  Sudden onset of leg weakness today. EXAM: CT HEAD WITHOUT CONTRAST TECHNIQUE: Contiguous axial images were obtained from the base of the skull through the vertex without intravenous contrast. COMPARISON:  None. FINDINGS: Brain: Age advanced cerebral atrophy, ventriculomegaly and mild periventricular white matter disease. No extra-axial fluid collections are identified. No CT findings for acute hemispheric infarction or intracranial hemorrhage. No mass lesions. The brainstem and cerebellum are normal. Vascular: Moderate to advanced vascular calcifications but no aneurysm or hyperdense vessels. Skull: No skull fracture or bone lesions. Sinuses/Orbits: The paranasal sinuses and mastoid air cells are clear except for a 2 cm mucous retention cyst or polyp in the left maxillary sinus. The globes are intact. Other: No scalp lesions or scalp hematoma. IMPRESSION: 1. Age advanced cerebral atrophy, ventriculomegaly and mild periventricular white matter disease. 2. No acute intracranial findings or mass lesions. Electronically Signed   By: Marijo Sanes M.D.   On: 04/03/2020 19:14   US RENAL  Result Date: 04/04/2020 CLINICAL DATA:  Acute kidney injury EXAM: RENAL / URINARY TRACT ULTRASOUND COMPLETE COMPARISON:  CT 04/03/2020 FINDINGS: Technical note: Significantly limited examination secondary to poor penetration related to patient body  habitus and shadowing from overlying bowel gas. Right Kidney: Limited. Echogenic renal parenchyma with cortical thinning compatible with chronic medical renal disease. Right renal length of approximately 10.6 cm. No shadowing stone or hydronephrosis is identified. Left Kidney: Very limited evaluation. Visualized left kidney appears diffusely echogenic with renal cortical thinning compatible with chronic medical renal disease. No obvious hydronephrosis. Bladder: Appears normal for degree of bladder distention. Other: None. IMPRESSION: 1. Limited exam. 2. No sonographic evidence of obstructive uropathy. 3. Atrophic, echogenic kidneys compatible with chronic medical renal disease. Electronically Signed   By: Davina Poke D.O.   On: 04/04/2020 12:18   DG CHEST PORT 1 VIEW  Result Date: 04/11/2020 CLINICAL DATA:  Hemodialysis catheter placement EXAM: PORTABLE CHEST 1 VIEW COMPARISON:  April 11, 2020 FINDINGS: The heart size is enlarged. Bilateral airspace disease is again noted and has progressed in the right mid and right upper lung zones. There is vascular congestion with small bilateral pleural effusions. There is a right-sided central venous catheter with tip projecting over the mid SVC. The catheter fall short of the cavoatrial junction by approximately 6-7 cm. There is no evidence for pneumothorax. Aortic calcifications are noted. The main pulmonary artery is dilated. IMPRESSION: 1. Interval worsening of bilateral airspace disease, greatest in the right mid and right upper lung zones. 2. Hemodialysis catheter as above. While the tip likely terminates in the SVC, a catheter fall short of the cavoatrial junction by at least 6 cm. 3. Persistent cardiomegaly with vascular congestion and small bilateral pleural effusions. Electronically Signed   By: Constance Holster M.D.   On: 04/11/2020 17:23   DG Chest Port 1 View  Result Date: 04/11/2020 CLINICAL DATA:  Shortness of breath. Cough. Hypoxia. COVID-19  virus infection. EXAM: PORTABLE CHEST 1 VIEW COMPARISON:  04/03/2020 FINDINGS: Heart size remains normal. New mild airspace opacity is seen in the central right upper lobe and peripheral left upper lobe. No evidence of pleural effusion. IMPRESSION: New mild bilateral upper lobe airspace disease, suspicious for viral pneumonia. Electronically Signed   By: Marlaine Hind M.D.   On: 04/11/2020 11:30   DG Chest Port 1 View  Result Date: 04/03/2020 CLINICAL DATA:  Right lower lobe infiltrate EXAM: PORTABLE CHEST 1 VIEW COMPARISON:  Same day CT abdomen pelvis, 04/03/2020, chest radiographs, 04/05/2014 FINDINGS: The heart size and mediastinal contours are within normal limits. Subtle heterogeneous airspace opacity of the right lung base. The visualized skeletal structures are unremarkable. IMPRESSION: Subtle heterogeneous airspace opacity of the right lung base, in keeping with findings of prior CT and consistent with infection or aspiration. Electronically Signed   By: Eddie Candle M.D.   On: 04/03/2020 20:23   ECHOCARDIOGRAM COMPLETE  Result Date: 04/04/2020    ECHOCARDIOGRAM REPORT   Patient Name:   JANECE LAIDLAW [place] Date of Exam: 04/04/2020 Medical Rec #:  [id number]     Height:       62.0 in Accession #:    [id number]    Weight:       211.2 lb Date of Birth:  [birth year]     BSA:          1.956 m Patient Age:    48 years      BP:           146/78 mmHg Patient Gender: F             HR:           82 bpm. Exam Location:  Inpatient Procedure: 2D Echo, Cardiac Doppler, Color Doppler and Intracardiac            Opacification Agent Indications:    Abnormal ECG 794.31 / R94.31  History:        Patient has prior history of Echocardiogram examinations, most                 recent 11/11/1996. CAD, Signs/Symptoms:Shortness of Breath and                 Chest Pain; Risk Factors:Hypertension, Diabetes and                 Dyslipidemia.  Sonographer:    Bernadene Person RDCS Referring Phys: [id number] [place]  1.  Left ventricular ejection fraction,  by estimation, is 60 to 65%. The left ventricle has normal function. The left ventricle has no regional wall motion abnormalities. Left ventricular diastolic parameters are consistent with Grade II diastolic dysfunction (pseudonormalization). Elevated left ventricular end-diastolic pressure.  2. Right ventricular systolic function is normal. The right ventricular size is normal. There is normal pulmonary artery systolic pressure.  3. The mitral valve is degenerative. Trivial mitral valve regurgitation. No evidence of mitral stenosis. Moderate mitral annular calcification.  4. The aortic valve is tricuspid. Aortic valve regurgitation is not visualized. Mild to moderate aortic valve sclerosis/calcification is present, without any evidence of aortic stenosis.  5. The inferior vena cava is normal in size with greater than 50% respiratory variability, suggesting right atrial pressure of 3 mmHg. FINDINGS  Left Ventricle: Left ventricular ejection fraction, by estimation, is 60 to 65%. The left ventricle has normal function. The left ventricle has no regional wall motion abnormalities. Definity contrast agent was given IV to delineate the left ventricular  endocardial borders. The left ventricular internal cavity size was normal in size. There is no left ventricular hypertrophy. Left ventricular diastolic parameters are consistent with Grade II diastolic dysfunction (pseudonormalization). Elevated left ventricular end-diastolic pressure. Right Ventricle: The right ventricular size is normal. No increase in right ventricular wall thickness. Right ventricular systolic function is normal. There is normal pulmonary artery systolic pressure. The tricuspid regurgitant velocity is 2.75 m/s, and  with an assumed right atrial pressure of 3 mmHg, the estimated right ventricular systolic pressure is 78.6 mmHg. Left Atrium: Left atrial size was normal in size. Right Atrium: Right atrial size was  normal in size. Pericardium: There is no evidence of pericardial effusion. Mitral Valve: The mitral valve is degenerative in appearance. There is moderate thickening of the mitral valve leaflet(s). There is moderate calcification of the mitral valve leaflet(s). Moderate mitral annular calcification. Trivial mitral valve regurgitation. No evidence of mitral valve stenosis. Tricuspid Valve: The tricuspid valve is normal in structure. Tricuspid valve regurgitation is mild . No evidence of tricuspid stenosis. Aortic Valve: The aortic valve is tricuspid. Aortic valve regurgitation is not visualized. Mild to moderate aortic valve sclerosis/calcification is present, without any evidence of aortic stenosis. Pulmonic Valve: The pulmonic valve was normal in structure. Pulmonic valve regurgitation is not visualized. No evidence of pulmonic stenosis. Aorta: The aortic root is normal in size and structure. Venous: The inferior vena cava is normal in size with greater than 50% respiratory variability, suggesting right atrial pressure of 3 mmHg. IAS/Shunts: No atrial level shunt detected by color flow Doppler.  LEFT VENTRICLE PLAX 2D LVIDd:         5.80 cm  Diastology LVIDs:         4.10 cm  LV e' medial:    3.81 cm/s LV PW:         0.70 cm  LV E/e' medial:  24.3 LV IVS:        1.00 cm  LV e' lateral:   4.57 cm/s LVOT diam:     2.00 cm  LV E/e' lateral: 20.2 LV SV:         78 LV SV Index:   40 LVOT Area:     3.14 cm  RIGHT VENTRICLE RV S prime:     13.10 cm/s TAPSE (M-mode): 2.0 cm LEFT ATRIUM             Index       RIGHT ATRIUM           Index  LA diam:        3.40 cm 1.74 cm/m  RA Area:     18.10 cm LA Vol (A2C):   56.0 ml 28.62 ml/m RA Volume:   47.40 ml  24.23 ml/m LA Vol (A4C):   51.1 ml 26.12 ml/m LA Biplane Vol: 53.4 ml 27.29 ml/m  AORTIC VALVE LVOT Vmax:   104.00 cm/s LVOT Vmean:  86.300 cm/s LVOT VTI:    0.249 m  AORTA Ao Root diam: 3.30 cm Ao Asc diam:  3.60 cm MITRAL VALVE                TRICUSPID VALVE MV Area  (PHT): 2.48 cm     TR Peak grad:   30.2 mmHg MV Decel Time: 306 msec     TR Vmax:        275.00 cm/s MV E velocity: 92.40 cm/s MV A velocity: 138.00 cm/s  SHUNTS MV E/A ratio:  0.67         Systemic VTI:  0.25 m                             Systemic Diam: 2.00 cm Jenkins Rouge MD Electronically signed by Jenkins Rouge MD Signature Date/Time: 04/04/2020/11:23:04 AM    Final    VAS [place] LOWER EXTREMITY VENOUS (DVT)  Result Date: 04/13/2020  Lower Venous DVT Study Indications: Covid +, elevated D dimer.  Limitations: Body habitus, cooperation and poor ultrasound/tissue interface. Performing Technologist: Antonieta Pert RDMS, RVT  Examination Guidelines: A complete evaluation includes B-mode imaging, spectral Doppler, color Doppler, and power Doppler as needed of all accessible portions of each vessel. Bilateral testing is considered an integral part of a complete examination. Limited examinations for reoccurring indications may be performed as noted. The reflux portion of the exam is performed with the patient in reverse Trendelenburg.  +---------+---------------+---------+-----------+----------+--------------+ RIGHT    CompressibilityPhasicitySpontaneityPropertiesThrombus Aging +---------+---------------+---------+-----------+----------+--------------+ CFV      Full           Yes      Yes                                 +---------+---------------+---------+-----------+----------+--------------+ SFJ      Full                                                        +---------+---------------+---------+-----------+----------+--------------+ FV Prox  Full                                                        +---------+---------------+---------+-----------+----------+--------------+ FV Mid   Full                                                        +---------+---------------+---------+-----------+----------+--------------+ FV DistalFull                                                         +---------+---------------+---------+-----------+----------+--------------+  PFV      Full                                                        +---------+---------------+---------+-----------+----------+--------------+ POP      Full           Yes      Yes                                 +---------+---------------+---------+-----------+----------+--------------+ PTV      Full                                                        +---------+---------------+---------+-----------+----------+--------------+ PERO     Partial                            dilated   Acute          +---------+---------------+---------+-----------+----------+--------------+ GSV      Full                                                        +---------+---------------+---------+-----------+----------+--------------+   +---------+---------------+---------+-----------+----------+--------------+ LEFT     CompressibilityPhasicitySpontaneityPropertiesThrombus Aging +---------+---------------+---------+-----------+----------+--------------+ CFV      Full           Yes      Yes                                 +---------+---------------+---------+-----------+----------+--------------+ SFJ      Full                                                        +---------+---------------+---------+-----------+----------+--------------+ FV Prox  Full                                                        +---------+---------------+---------+-----------+----------+--------------+ FV Mid   Full                                                        +---------+---------------+---------+-----------+----------+--------------+ FV DistalFull                                                        +---------+---------------+---------+-----------+----------+--------------+  PFV      Full                                                         +---------+---------------+---------+-----------+----------+--------------+ POP      Full           Yes      Yes                                 +---------+---------------+---------+-----------+----------+--------------+ PTV      Full                                                        +---------+---------------+---------+-----------+----------+--------------+ PERO     Full                                                        +---------+---------------+---------+-----------+----------+--------------+ GSV      Full                                                        +---------+---------------+---------+-----------+----------+--------------+     Summary: RIGHT: - Findings consistent with acute deep vein thrombosis involving the right peroneal veins. - No cystic structure found in the popliteal fossa. - Small segment of thrombus in the right calf.  LEFT: - There is no evidence of deep vein thrombosis in the lower extremity. However, portions of this examination were limited- see technologist comments above.  - No cystic structure found in the popliteal fossa. - Difficult exam bilaterally due to large body habitus, poor penetration and poor patient cooperation.  *See table(s) above for measurements and observations. Electronically signed by Harold Barban MD on 04/13/2020 at 6:07:50 PM.    Final

## 2020-04-16 LAB — RETICULOCYTES
Immature Retic Fract: 12.6 % (ref 2.3–15.9)
RBC.: 4.17 MIL/uL (ref 3.87–5.11)
Retic Count, Absolute: 105.1 10*3/uL (ref 19.0–186.0)
Retic Ct Pct: 2.5 % (ref 0.4–3.1)

## 2020-04-16 LAB — CBC WITH DIFFERENTIAL/PLATELET
Abs Immature Granulocytes: 0.11 10*3/uL — ABNORMAL HIGH (ref 0.00–0.07)
Basophils Absolute: 0 10*3/uL (ref 0.0–0.1)
Basophils Relative: 0 %
Eosinophils Absolute: 0 10*3/uL (ref 0.0–0.5)
Eosinophils Relative: 0 %
HCT: 31.9 % — ABNORMAL LOW (ref 36.0–46.0)
Hemoglobin: 10.6 g/dL — ABNORMAL LOW (ref 12.0–15.0)
Immature Granulocytes: 1 %
Lymphocytes Relative: 13 %
Lymphs Abs: 1 10*3/uL (ref 0.7–4.0)
MCH: 28.3 pg (ref 26.0–34.0)
MCHC: 33.2 g/dL (ref 30.0–36.0)
MCV: 85.1 fL (ref 80.0–100.0)
Monocytes Absolute: 0.5 10*3/uL (ref 0.1–1.0)
Monocytes Relative: 6 %
Neutro Abs: 6.1 10*3/uL (ref 1.7–7.7)
Neutrophils Relative %: 80 %
Platelets: 204 10*3/uL (ref 150–400)
RBC: 3.75 MIL/uL — ABNORMAL LOW (ref 3.87–5.11)
RDW: 14.8 % (ref 11.5–15.5)
WBC: 7.7 10*3/uL (ref 4.0–10.5)
nRBC: 0.3 % — ABNORMAL HIGH (ref 0.0–0.2)

## 2020-04-16 LAB — IRON AND TIBC
Iron: 58 ug/dL (ref 28–170)
Saturation Ratios: 30 % (ref 10.4–31.8)
TIBC: 190 ug/dL — ABNORMAL LOW (ref 250–450)
UIBC: 132 ug/dL

## 2020-04-16 LAB — CULTURE, BLOOD (ROUTINE X 2)
Culture: NO GROWTH
Culture: NO GROWTH
Special Requests: ADEQUATE
Special Requests: ADEQUATE

## 2020-04-16 LAB — GLUCOSE, CAPILLARY
Glucose-Capillary: 147 mg/dL — ABNORMAL HIGH (ref 70–99)
Glucose-Capillary: 233 mg/dL — ABNORMAL HIGH (ref 70–99)
Glucose-Capillary: 221 mg/dL — ABNORMAL HIGH (ref 70–99)
Glucose-Capillary: 217 mg/dL — ABNORMAL HIGH (ref 70–99)

## 2020-04-16 LAB — COMPREHENSIVE METABOLIC PANEL
ALT: 23 U/L (ref 0–44)
AST: 24 U/L (ref 15–41)
Albumin: 2.4 g/dL — ABNORMAL LOW (ref 3.5–5.0)
Alkaline Phosphatase: 57 U/L (ref 38–126)
Anion gap: 12 (ref 5–15)
BUN: 53 mg/dL — ABNORMAL HIGH (ref 8–23)
CO2: 25 mmol/L (ref 22–32)
Calcium: 8.2 mg/dL — ABNORMAL LOW (ref 8.9–10.3)
Chloride: 104 mmol/L (ref 98–111)
Creatinine, Ser: 4.22 mg/dL — ABNORMAL HIGH (ref 0.44–1.00)
GFR, Estimated: 11 mL/min — ABNORMAL LOW (ref >60)
Glucose, Bld: 192 mg/dL — ABNORMAL HIGH (ref 70–99)
Potassium: 3.6 mmol/L (ref 3.5–5.1)
Sodium: 141 mmol/L (ref 135–145)
Total Bilirubin: 0.6 mg/dL (ref 0.3–1.2)
Total Protein: 5.1 g/dL — ABNORMAL LOW (ref 6.5–8.1)

## 2020-04-16 LAB — VITAMIN B12: Vitamin B-12: 928 pg/mL — ABNORMAL HIGH (ref 180–914)

## 2020-04-16 LAB — FOLATE: Folate: 6.5 ng/mL (ref >5.9)

## 2020-04-16 LAB — FERRITIN: Ferritin: 594 ng/mL — ABNORMAL HIGH (ref 11–307)

## 2020-04-16 MED ORDER — HEPARIN SODIUM (PORCINE) 1000 UNIT/ML IJ SOLN
INTRAMUSCULAR | Status: AC
  Administered 2020-04-16: 2600 [IU]
  Filled 2020-04-16: qty 3

## 2020-04-16 MED ORDER — DARBEPOETIN ALFA 60 MCG/0.3ML IJ SOSY
PREFILLED_SYRINGE | INTRAMUSCULAR | Status: AC
  Administered 2020-04-16: 60 ug via INTRAVENOUS
  Filled 2020-04-16: qty 0.3

## 2020-04-16 MED ORDER — LIP MEDEX EX OINT
TOPICAL_OINTMENT | CUTANEOUS | Status: DC | PRN
  Administered 2020-04-16: 1 via TOPICAL
  Filled 2020-04-16: qty 7

## 2020-04-16 MED ORDER — AMLODIPINE BESYLATE 5 MG PO TABS
5.0000 mg | ORAL_TABLET | Freq: Every day | ORAL | Status: DC
  Administered 2020-04-16 – 2020-04-21 (×6): 5 mg via ORAL
  Filled 2020-04-16 (×6): qty 1

## 2020-04-16 MED ORDER — SODIUM CHLORIDE 0.9% FLUSH
10.0000 mL | INTRAVENOUS | Status: DC | PRN
  Administered 2020-04-21: 10 mL

## 2020-04-16 NOTE — Progress Notes (Signed)
Patient ID: Ann [name], female   DOB: [birth year], 69 y.o.   MRN: [id number]   S: at least 500 UOP recorded - BUN and crt up slightly from yesterday to today-  on room air   O:BP (!) 161/65 (BP Location: Left Arm)   Pulse 72   Temp 97.6 F (36.4 C) (Oral)   Resp 12   Wt 100.3 kg   SpO2 97%   BMI 40.44 kg/m   Intake/Output Summary (Last 24 hours) at 04/16/2020 1119 Last data filed at 04/16/2020 0900 Gross per 24 hour  Intake 560 ml  Output 500 ml  Net 60 ml   Intake/Output: I/O last 3 completed shifts: In: 600 [P.O.:600] Out: 500 [Urine:500]  Intake/Output this shift:  Total I/O In: 320 [P.O.:320] Out: -  Weight change:  Not examined-  Noted reviewed   Recent Labs  Lab 04/11/20 1032 04/12/20 0416 04/13/20 0839 04/14/20 0145 04/15/20 0624 04/16/20 0214  NA 141 142 142 140 139 141  K 4.6 5.0 4.4 4.4 3.9 3.6  CL 109 110 104 105 103 104  CO2 14* 15* 18* 19* 24 25  GLUCOSE 203* 212* 274* 397* 175* 192*  BUN 103* 111* 80* 91* 49* 53*  CREATININE 8.99* 9.47* 6.80* 6.78* 3.89* 4.22*  ALBUMIN 2.3* 2.2* 2.4* 2.5* 2.5* 2.4*  CALCIUM 8.5* 8.7* 8.7* 8.4* 8.0* 8.2*  PHOS  --  6.6*  --   --   --   --   AST 52* 37 27 21 25 24   ALT 34 29 24 22 22 23    Liver Function Tests: Recent Labs  Lab 04/14/20 0145 04/15/20 0624 04/16/20 0214  AST 21 25 24   ALT 22 22 23   ALKPHOS 70 60 57  BILITOT 0.6 0.9 0.6  PROT 5.7* 5.4* 5.1*  ALBUMIN 2.5* 2.5* 2.4*   No results for input(s): LIPASE, AMYLASE in the last 168 hours. No results for input(s): AMMONIA in the last 168 hours. CBC: Recent Labs  Lab 04/12/20 0416 04/13/20 0839 04/14/20 0145 04/15/20 0624 04/16/20 0214  WBC 10.0 10.3 9.7 10.4 7.7  NEUTROABS 8.6* 9.2* 8.7* 8.9* 6.1  HGB 7.7* 9.6* 10.7* 10.5* 10.6*  HCT 25.0* 30.0* 32.5* 30.3* 31.9*  MCV 91.2 87.0 88.6 84.4 85.1  PLT 306 266 275 256 204   Cardiac Enzymes: No results for input(s): CKTOTAL, CKMB, CKMBINDEX, TROPONINI in the last 168 hours. CBG: Recent  Labs  Lab 04/15/20 0752 04/15/20 1146 04/15/20 1616 04/15/20 2100 04/16/20 0739  GLUCAP 161* 191* 191* 167* 147*    Iron Studies:  No results for input(s): IRON, TIBC, TRANSFERRIN, FERRITIN in the last 72 hours. Studies/Results: CT HEAD WO CONTRAST  Result Date: 04/14/2020 CLINICAL DATA:  Delirium.  Altered mental status. EXAM: CT HEAD WITHOUT CONTRAST TECHNIQUE: Contiguous axial images were obtained from the base of the skull through the vertex without intravenous contrast. COMPARISON:  April 03, 2020. FINDINGS: Brain: No evidence of acute large vascular territory infarction, hemorrhage, hydrocephalus, extra-axial collection or mass lesion/mass effect. Similar cerebral atrophy with ex vacuo ventricular dilation. Mild white matter hypodensities, likely related to chronic microvascular ischemic disease. Vascular: Calcific atherosclerosis. Skull: No acute fracture. Sinuses/Orbits: Left maxillary sinus retention cyst versus polyp. Unremarkable orbits. Other: No mastoid effusions. IMPRESSION: 1. No evidence of acute intracranial abnormality. 2. Similar cerebral atrophy and ventriculomegaly. Electronically Signed   By: Margaretha Sheffield MD   On: 04/14/2020 16:08   . sodium chloride   Intravenous Once  . amLODipine  5 mg Oral Daily  .  apixaban  10 mg Oral BID   Followed by  . [START ON 04/20/2020] apixaban  5 mg Oral BID  . vitamin C  500 mg Oral Daily  . Chlorhexidine Gluconate Cloth  6 each Topical Q0600  . Chlorhexidine Gluconate Cloth  6 each Topical Q0600  . darbepoetin (ARANESP) injection - DIALYSIS  60 mcg Intravenous Q Fri-HD  . hydrALAZINE  50 mg Oral TID  . insulin aspart  0-6 Units Subcutaneous TID WC  . insulin detemir  10 Units Subcutaneous Daily  . Ipratropium-Albuterol  1 puff Inhalation Q6H  . metoprolol tartrate  25 mg Oral BID  . nystatin   Topical BID  . pantoprazole  20 mg Oral Daily  . pravastatin  40 mg Oral QPM  . rOPINIRole  0.25 mg Oral QPM  . sodium  bicarbonate  650 mg Oral BID    BMET    Component Value Date/Time   NA 141 04/16/2020 0214   K 3.6 04/16/2020 0214   CL 104 04/16/2020 0214   CO2 25 04/16/2020 0214   GLUCOSE 192 (H) 04/16/2020 0214   BUN 53 (H) 04/16/2020 0214   CREATININE 4.22 (H) 04/16/2020 0214   CREATININE 1.53 (H) 09/28/2011 0945   CALCIUM 8.2 (L) 04/16/2020 0214   CALCIUM 9.6 11/01/2009 1543   GFRNONAA 11 (L) 04/16/2020 0214   GFRNONAA 36 (L) 09/28/2011 0945   GFRAA 21 (L) 02/27/2019 0548   GFRAA 42 (L) 09/28/2011 0945   CBC    Component Value Date/Time   WBC 7.7 04/16/2020 0214   RBC 3.75 (L) 04/16/2020 0214   HGB 10.6 (L) 04/16/2020 0214   HCT 31.9 (L) 04/16/2020 0214   HCT 31.2 (L) 02/21/2019 0018   PLT 204 04/16/2020 0214   MCV 85.1 04/16/2020 0214   MCH 28.3 04/16/2020 0214   MCHC 33.2 04/16/2020 0214   RDW 14.8 04/16/2020 0214   LYMPHSABS 1.0 04/16/2020 0214   MONOABS 0.5 04/16/2020 0214   EOSABS 0.0 04/16/2020 0214   BASOSABS 0.0 04/16/2020 0214      Assessment and plan: 1. AKI/CKD stage IV- (baseline Scr 2.7-3.3) rising bun/creat in setting of covid-19 infection. S/p first HD 04/12/20 due - had second treatement 12/22.   1. Had 1st HD session on 04/12/20 , second HD 12/22 - planning for third HD today.  Given how poor her renal function is at baseline it would not surprise me if with this episode she is now ESRD.  More support is better than less at this point with hosp for COVID-  But , will follow for signs of renal recovery 2. covid-19 infection- per primary team. On room air 3. HTN/Volume- does not appear to be volume overloaded per hosp note 4. CAD- stable 5. Right peroneal DVT- started on Eliquis per primary 6. Anemia of CKD stage IV- low Hgb at 7.7. Started ESA and follow H/H - no iv iron given covid. Transfuse prn. 7. Vascular access- RIJ temp HD catheter placed 04/11/20. Had been referred to [facility] for access placement. Will likely need tunneled HD catheter next week if no  significant improvement of UOP and Scr.   Louis Meckel  [employer] [phone]

## 2020-04-16 NOTE — Progress Notes (Signed)
Physical Therapy Treatment Patient Details Name: Ann [name] MRN: [id number] DOB: 05/30/1950 Today's Date: 04/16/2020    History of Present Illness Pt is a 69 y.o. female recently admitted to [facility] for COVID-19 related GI and respiratory distress (04/03/20-04/09/20 declined SNF recommendation and went home), now admitted 04/11/20 with worsening symptoms. Workup for acute hypoxic respiratory failure due to COVID-19 viral pneumonitis, progressive AKI on CKD IV, acute metabolic encephalopathy. S/p RIJ temp HD cath placement 12/19. (+) RLE DVT 12/21. PMH includes HTN, CAD, DM, CKD, obesity, chronic pain.   PT Comments    Pt with significant improvement in cognition and functional status compared to PT evaluation two days prior; alert, pleasant and conversant. Pt still requiring assist to maintain stability with standing mobility. Limited by generalized weakness, decreased activity tolerance, poor safety awareness, and impaired problem solving. Pt states safe to go home even though requiring assist to prevent fall while standing; spoke with granddaughter on phone while in patient's room to discuss d/c planning; family in agreement with SNF-level therapies to maximize functional mobility and independence prior to return home. Will continue to follow acutely.   Follow Up Recommendations  SNF;Supervision/Assistance - 24 hour     Equipment Recommendations  Wheelchair cushion (measurements PT);Wheelchair (measurements PT)    Recommendations for Other Services       Precautions / Restrictions Precautions Precautions: Fall;Other (comment) Precaution Comments: Urine incontinence    Mobility  Bed Mobility Overal bed mobility: Modified Independent Bed Mobility: Supine to Sit              Transfers Overall transfer level: Needs assistance Equipment used: 1 person hand held assist Transfers: Sit to/from Stand Sit to Stand: Min assist Stand pivot transfers: Min assist       General  transfer comment: Pt declined use of RW, reliant on momentum and minA for HHA to maintain stability; pivotal steps to recliner, pt reaching for BUE support on chair requiring minA to prevent anterior LOB, difficulty achieving fully upright posture; declined further mobility since lunch arrived  Ambulation/Gait                 Stairs             Wheelchair Mobility    Modified Rankin (Stroke Patients Only)       Balance Overall balance assessment: Needs assistance Sitting-balance support: Feet supported;Bilateral upper extremity supported Sitting balance-Leahy Scale: Fair     Standing balance support: Bilateral upper extremity supported;During functional activity;Single extremity supported Standing balance-Leahy Scale: Poor Standing balance comment: Reliant on UE support and external assist                            Cognition Arousal/Alertness: Awake/alert Behavior During Therapy: WFL for tasks assessed/performed Overall Cognitive Status: Impaired/Different from baseline Area of Impairment: Attention;Safety/judgement;Awareness;Following commands                   Current Attention Level: Selective   Following Commands: Follows multi-step commands inconsistently Safety/Judgement: Decreased awareness of safety;Decreased awareness of deficits Awareness: Emergent          Exercises      General Comments General comments (skin integrity, edema, etc.): SpO2 >/90% on RA. Increased time discussing safe d/c plan, including speaking with granddaughter on phone regarding this      Pertinent Vitals/Pain Pain Assessment: No/denies pain Pain Intervention(s): Monitored during session    Home Living  Prior Function            PT Goals (current goals can now be found in the care plan section) Acute Rehab PT Goals Patient Stated Goal: Post-acute rehab at SNF to regain strength before going home PT Goal Formulation:  With patient/family Progress towards PT goals: Progressing toward goals    Frequency    Min 2X/week      PT Plan Current plan remains appropriate    Co-evaluation              AM-PAC PT "6 Clicks" Mobility   Outcome Measure  Help needed turning from your back to your side while in a flat bed without using bedrails?: A Little Help needed moving from lying on your back to sitting on the side of a flat bed without using bedrails?: A Little Help needed moving to and from a bed to a chair (including a wheelchair)?: A Little Help needed standing up from a chair using your arms (e.g., wheelchair or bedside chair)?: A Little Help needed to walk in hospital room?: A Lot Help needed climbing 3-5 steps with a railing? : A Lot 6 Click Score: 16    End of Session   Activity Tolerance: Patient tolerated treatment well;Patient limited by fatigue Patient left: in chair;with call bell/phone within reach;with chair alarm set;with nursing/sitter in room Nurse Communication: Mobility status PT Visit Diagnosis: Other abnormalities of gait and mobility (R26.89);Muscle weakness (generalized) (M62.81)     Time: 1245-1310 PT Time Calculation (min) (ACUTE ONLY): 25 min  Charges:  $Therapeutic Activity: 8-22 mins $Self Care/Home Management: 8-22                     Mabeline Caras, PT, DPT Acute Rehabilitation Services  Pager [phone] Office [phone]  Derry Lory 04/16/2020, 3:45 PM

## 2020-04-16 NOTE — Progress Notes (Signed)
PROGRESS NOTE                                                                             PROGRESS NOTE                                                                                                                                                                                                             Patient Demographics:    Ann [name], is a 69 y.o. female, DOB - [birth year], UGQ:[id number]  Outpatient Primary MD for the patient is [name], MD    LOS - 5  Admit date - 04/11/2020    Chief Complaint  Patient presents with  . Covid Positive       Brief Narrative    HPI: Ann [name] is a 69 y.o. female with history h/o stage IV CKD with associated anemia, CAD, T2DM, HTN, HLD, GERD, asthma, OSA on CPAP, chronic lumbar back pain who was recently admitted 12/11-12/17/21 to [facility] for COVID-19 related GI/resp symptoms treated with Remdesevir ( steroids not given as no hypoxia throughout the stay) presents to ED today with worsening symptoms. In the ED 12/11, she was felt to be dehydrated in addition to CKD baseline creatinine 2.7-3.3, creatinine was 5.3 on admission,did peak at 6.5,  plateaued around 6.2 range for 3 days prior to discharge. Diuretics and ACEiwere held on discharge and nephrology follow up arranged. She was deconditioned at discharge but declined SNF , went home with [facility].  Per daughter, patient has been eating poorly and sleeping most of the time since last hospital discharge.  She also appeared to be having labored breathing and unable to perform any of her ADLs.  Daughter asking if staff here can help her take a bath as she has been unable to get her to the bathroom.  Patient currently oriented to self/person only but not to place or time.  She is unable to provide any details regarding hospital visit but does report productive cough and shortness of breath.  ED findings today: Afebrile, SBP  1 20-160, RR 15-19, hypoxic on arrival  requiring NRB M transiently-transitioned to 3 L O2 now.  Lab work shows worsening renal function with BUN 86->103, creatinine 6.26->8.99, bicarb 14, potassium 4.6, albumin 2.6->2.3.  CRP 20.6, procalcitonin 2.68, lactate 1.7.  Ferritin 1320, LDH 281, triglyceride 134, D-dimer 2.6->6.0 WBC 10.3, hemoglobin 9.0, platelet 321.  Chest x-ray shows "new mild bilateral upper lobe airspace disease, suspicious for viral pneumonia."  She received 10 mg IV Decadron x1 and on IV fluids NS at 100 mL/h.  Per EDP, nephrology has been consulted and patient requested to be admitted for further evaluation and management.    Subjective:    [name] today [place] any complaint, mentation back to baseline, she is appropriate, she denies any complaints.  .   Assessment  & Plan :    Principal Problem:   Acute respiratory failure with hypoxia (HCC) Active Problems:   HYPERTENSION   CAD (coronary artery disease)   Obesity, Class III, BMI 40-49.9 (morbid obesity) ([place])   Acute encephalopathy   Chronic pain   2019 novel coronavirus disease (COVID-19)   GERD (gastroesophageal reflux disease)   AKI (acute kidney injury) ([place])  Acute Hypoxic Resp. Failure due to Acute Covid 19 Viral Pneumonitis during the ongoing 2020 Covid 19 Pandemic -  -She is unvaccinated -He was treated with steroids and Remdesivir during recent hospitalization, was started on steroids, no hypoxia, steroid has been stopped -Received Actemra on admission. -Was encouraged to use incentive spirometry and flutter valve.  Acute DVT -Elevated d D-dimers, venous Doppler was obtained she is having right peroneal veins DVT, given her Covid infection, and hypercoagulable state, high risk for VTE, continue with full anticoagulation with Eliquis.  Progressive AKI on CKD stage IV:  -Management per renal, started on hemodialysis this hospital stay, likely will be ESRD and need dialysis long-term.    Acute metabolic encephalopathy: - Likely related to  significant uremia (greater than 100) and COVID-19 infection.  Patient has been improving gradually, CT head with no acute findings.   -Has resolved after few hemodialysis sessions.  Diabetes mellitus type 2:  -CBG currently acceptable on Levemir and sliding scale   Anemia -The setting of chronic kidney disease, she has 1 unit PRBC 12/20 with good response.  CAD: Resume home medications, aspirin, beta-blockers and statins.  Hypertension:  -Resume home medications, creased home dose Norvasc to 5 mg and titrate as needed.  Diuretics/ACE inhibitors held in last admission  Chronic pain: Hold Neurontin/Cymbalta for now until mental status improves.  GERD: Resume PPI unless otherwise advised by nephrology.  Obesity class III BMI 40-49.9: Follow-up PCP, weight modifying recommendations upon discharge    SpO2: 96 % O2 Flow Rate (L/min): 3 L/min  Recent Labs  Lab 04/11/20 1031 04/11/20 1032 04/11/20 1032 04/11/20 1712 04/12/20 0416 04/13/20 0839 04/14/20 0145 04/15/20 0624 04/16/20 0214  WBC  --  10.3   < > 10.7* 10.0 10.3 9.7 10.4 7.7  PLT  --  321   < > 286 306 266 275 256 204  CRP  --  20.6*  --   --   --   --   --   --   --   DDIMER  --  6.00*  --   --  7.15* 5.31* 3.85*  --   --   PROCALCITON  --  2.68  --   --   --   --   --   --   --   AST  --  52*   < >  --  37 27 21 25 24   ALT  --  34   < >  --  29 24 22 22 23   ALKPHOS  --  73   < >  --  70 67 70 60 57  BILITOT  --  0.9   < >  --  0.6 0.8 0.6 0.9 0.6  ALBUMIN  --  2.3*   < >  --  2.2* 2.4* 2.5* 2.5* 2.4*  LATICACIDVEN 1.7  --   --  1.1  --   --   --   --   --    < > = values in this interval not displayed.       ABG     Component Value Date/Time   TCO2 20 09/27/2008 1636       Condition - Extremely Guarded  Family Communication  : Discussed with daughter [name] by phone [phone], 12/22, left voicemail 12/23  updated 12/24.  Code Status :  Full  Consults  :  Renal, PCCM  Procedures  : A  hemodialysis catheter placement by PCCM 12/19, 1 unit PRBC transfusion on 12/20  Disposition Plan  :    Status is: Inpatient  Remains inpatient appropriate because:Hemodynamically unstable   Dispo: The patient is from: Home              Anticipated d/c is to: SNF              Anticipated d/c date is: > 3 days              Patient currently is not medically stable to d/c.      DVT Prophylaxis  :  Heparin>>Eliquis  Lab Results  Component Value Date   PLT 204 04/16/2020    Diet :  Diet Order            Diet renal/carb modified with fluid restriction Diet-HS Snack? Nothing; Fluid restriction: 1200 mL Fluid; Room service appropriate? No; Fluid consistency: Thin  Diet effective now                  Inpatient Medications  Scheduled Meds: . sodium chloride   Intravenous Once  . amLODipine  5 mg Oral Daily  . apixaban  10 mg Oral BID   Followed by  . [START ON 04/20/2020] apixaban  5 mg Oral BID  . vitamin C  500 mg Oral Daily  . Chlorhexidine Gluconate Cloth  6 each Topical Q0600  . Chlorhexidine Gluconate Cloth  6 each Topical Q0600  . darbepoetin (ARANESP) injection - DIALYSIS  60 mcg Intravenous Q Fri-HD  . hydrALAZINE  50 mg Oral TID  . insulin aspart  0-6 Units Subcutaneous TID WC  . insulin detemir  10 Units Subcutaneous Daily  . Ipratropium-Albuterol  1 puff Inhalation Q6H  . metoprolol tartrate  25 mg Oral BID  . nystatin   Topical BID  . pantoprazole  20 mg Oral Daily  . pravastatin  40 mg Oral QPM  . rOPINIRole  0.25 mg Oral QPM  . sodium bicarbonate  650 mg Oral BID   Continuous Infusions:  PRN Meds:.albuterol, bisacodyl, guaiFENesin-dextromethorphan, ondansetron **OR** ondansetron (ZOFRAN) IV, sodium chloride flush  Antibiotics  :    Anti-infectives (From admission, onward)   Start     Dose/Rate Route Frequency Ordered Stop   04/11/20 1300  cefTRIAXone (ROCEPHIN) 1 g in sodium chloride 0.9 % 100 mL IVPB  Status:  Discontinued  1 g 200  mL/hr over 30 Minutes Intravenous Every 24 hours 04/11/20 1252 04/13/20 1418   04/11/20 1300  doxycycline (VIBRA-TABS) tablet 100 mg  Status:  Discontinued        100 mg Oral Every 12 hours 04/11/20 1252 04/13/20 1418        Nyle Limb M.D on 04/16/2020 at 12:09 PM  To page go to [email or web address]  [facility] -  Office  [phone]       Objective:   Vitals:   04/16/20 0000 04/16/20 0409 04/16/20 0740 04/16/20 1201  BP: (!) 147/79 137/71 (!) 161/65 (!) 156/73  Pulse: 64 71 72 79  Resp: 12 10 12 14   Temp: 97.8 F (36.6 C) 98.7 F (37.1 C) 97.6 F (36.4 C) 97.8 F (36.6 C)  TempSrc: Oral Oral Oral Oral  SpO2: 99% 95% 97% 96%  Weight:        Wt Readings from Last 3 Encounters:  04/15/20 100.3 kg  04/09/20 99 kg  02/25/19 115 kg     Intake/Output Summary (Last 24 hours) at 04/16/2020 1209 Last data filed at 04/16/2020 0900 Gross per 24 hour  Intake 560 ml  Output 500 ml  Net 60 ml     Physical Exam  Awake Alert, Oriented X 3, No new F.N deficits, Normal affect Symmetrical Chest wall movement, Good air movement bilaterally, CTAB RRR,No Gallops,Rubs or new Murmurs, No Parasternal Heave +ve B.Sounds, Abd Soft, No tenderness, No rebound - guarding or rigidity. No Cyanosis, Clubbing or edema, No new Rash or bruise      Data Review:    CBC Recent Labs  Lab 04/12/20 0416 04/13/20 0839 04/14/20 0145 04/15/20 0624 04/16/20 0214  WBC 10.0 10.3 9.7 10.4 7.7  HGB 7.7* 9.6* 10.7* 10.5* 10.6*  HCT 25.0* 30.0* 32.5* 30.3* 31.9*  PLT 306 266 275 256 204  MCV 91.2 87.0 88.6 84.4 85.1  MCH 28.1 27.8 29.2 29.2 28.3  MCHC 30.8 32.0 32.9 34.7 33.2  RDW 16.2* 15.5 15.8* 15.3 14.8  LYMPHSABS 0.6* 0.5* 0.4* 0.7 1.0  MONOABS 0.2 0.3 0.3 0.5 0.5  EOSABS 0.0 0.0 0.0 0.0 0.0  BASOSABS 0.0 0.0 0.0 0.0 0.0    Recent Labs  Lab 04/11/20 1031 04/11/20 1032 04/11/20 1032 04/11/20 1712 04/12/20 0416 04/13/20 0839 04/14/20 0145 04/15/20 0624  04/16/20 0214  NA  --  141   < >  --  142 142 140 139 141  K  --  4.6   < >  --  5.0 4.4 4.4 3.9 3.6  CL  --  109   < >  --  110 104 105 103 104  CO2  --  14*   < >  --  15* 18* 19* 24 25  GLUCOSE  --  203*   < >  --  212* 274* 397* 175* 192*  BUN  --  103*   < >  --  111* 80* 91* 49* 53*  CREATININE  --  8.99*   < >  --  9.47* 6.80* 6.78* 3.89* 4.22*  CALCIUM  --  8.5*   < >  --  8.7* 8.7* 8.4* 8.0* 8.2*  AST  --  52*   < >  --  37 27 21 25 24   ALT  --  34   < >  --  29 24 22 22 23   ALKPHOS  --  73   < >  --  70 67 70 60 57  BILITOT  --  0.9   < >  --  0.6 0.8 0.6 0.9 0.6  ALBUMIN  --  2.3*   < >  --  2.2* 2.4* 2.5* 2.5* 2.4*  MG  --   --   --   --  2.4  --   --   --   --   CRP  --  20.6*  --   --   --   --   --   --   --   DDIMER  --  6.00*  --   --  7.15* 5.31* 3.85*  --   --   PROCALCITON  --  2.68  --   --   --   --   --   --   --   LATICACIDVEN 1.7  --   --  1.1  --   --   --   --   --    < > = values in this interval not displayed.    ------------------------------------------------------------------------------------------------------------------ No results for input(s): CHOL, HDL, LDLCALC, TRIG, CHOLHDL, LDLDIRECT in the last 72 hours.  Lab Results  Component Value Date   HGBA1C 6.6 (H) 04/04/2020   ------------------------------------------------------------------------------------------------------------------ No results for input(s): TSH, T4TOTAL, T3FREE, THYROIDAB in the last 72 hours.  Invalid input(s): FREET3  Cardiac Enzymes No results for input(s): CKMB, TROPONINI, MYOGLOBIN in the last 168 hours.  Invalid input(s): CK ------------------------------------------------------------------------------------------------------------------ No results found for: BNP  Micro Results Recent Results (from the past 240 hour(s))  Blood Culture (routine x 2)     Status: None (Preliminary result)   Collection Time: 04/11/20 10:18 AM   Specimen: BLOOD  Result Value Ref  Range Status   Specimen Description BLOOD RIGHT ANTECUBITAL  Final   Special Requests   Final    BOTTLES DRAWN AEROBIC ONLY Blood Culture adequate volume   Culture   Final    NO GROWTH 4 DAYS Performed at [facility], [address] [place], [place] [address]    Report Status PENDING  Incomplete  Blood Culture (routine x 2)     Status: None (Preliminary result)   Collection Time: 04/11/20 10:23 AM   Specimen: BLOOD  Result Value Ref Range Status   Specimen Description BLOOD LEFT ANTECUBITAL  Final   Special Requests   Final    BOTTLES DRAWN AEROBIC AND ANAEROBIC Blood Culture adequate volume   Culture   Final    NO GROWTH 4 DAYS Performed at [facility], [place] [address] [place], [place] [address]    Report Status PENDING  Incomplete  MRSA PCR Screening     Status: None   Collection Time: 04/11/20 12:56 PM   Specimen: Nasal Mucosa; Nasopharyngeal  Result Value Ref Range Status   MRSA by PCR NEGATIVE NEGATIVE Final    Comment:        The GeneXpert MRSA Assay (FDA approved for NASAL specimens only), is one component of a comprehensive MRSA colonization surveillance program. It is not intended to diagnose MRSA infection nor to guide or monitor treatment for MRSA infections. Performed at [facility], [place] [address], [place], [place] [address]     Radiology Reports CT ABDOMEN PELVIS WO CONTRAST  Result Date: 04/03/2020 CLINICAL DATA:  Abdominal pain. EXAM: CT ABDOMEN AND PELVIS WITHOUT CONTRAST TECHNIQUE: Multidetector CT imaging of the abdomen and pelvis was performed following the standard protocol without IV contrast. COMPARISON:  02/20/2019 FINDINGS: Lower chest: Patchy ground-glass infiltrates in the right lower lobe could  be due to developing pneumonia, atypical pneumonia or acute aspiration. No pleural effusions. The heart is normal in size. No pericardial effusion. Three-vessel coronary artery calcifications are noted. Small hiatal hernia.  Hepatobiliary: No hepatic lesions are identified without contrast. No intrahepatic biliary dilatation. The gallbladder is surgically absent. No common bile duct dilatation. Pancreas: Marked fatty atrophy of the pancreas. No mass, inflammation or ductal dilatation. Spleen: Normal size.  No focal lesions. Adrenals/Urinary Tract: The adrenal glands are unremarkable. Small, chronically atrophied kidneys consistent with chronic renal failure. No worrisome renal lesions or hydroureteronephrosis. The bladder is unremarkable. Stomach/Bowel: The stomach, duodenum, small bowel and colon are unremarkable. No acute inflammatory changes, mass lesions or obstructive findings. The terminal ileum is normal. Scattered colonic diverticulosis but no findings for acute diverticulitis. Vascular/Lymphatic: Significant diffuse vascular calcifications. No aneurysm. No mesenteric or retroperitoneal mass or adenopathy. No free air or free fluid. No leaking oral contrast. Reproductive: The uterus is surgically absent. Both ovaries are still present and appear normal. Other: Moderate-sized periumbilical abdominal wall hernia containing fat. Musculoskeletal: No significant bony findings. IMPRESSION: 1. No acute abdominal/pelvic findings, mass lesions or adenopathy. 2. Patchy ground-glass infiltrates in the right lower lobe could be due to developing pneumonia, atypical pneumonia or acute aspiration. 3. Status post cholecystectomy. No biliary dilatation. 4. Small, chronically atrophied kidneys consistent with chronic renal failure. 5. Moderate-sized periumbilical abdominal wall hernia containing fat. 6. Aortic atherosclerosis. Aortic Atherosclerosis (ICD10-I70.0). Electronically Signed   By: Marijo Sanes M.D.   On: 04/03/2020 19:19   CT HEAD WO CONTRAST  Result Date: 04/14/2020 CLINICAL DATA:  Delirium.  Altered mental status. EXAM: CT HEAD WITHOUT CONTRAST TECHNIQUE: Contiguous axial images were obtained from the base of the skull through  the vertex without intravenous contrast. COMPARISON:  April 03, 2020. FINDINGS: Brain: No evidence of acute large vascular territory infarction, hemorrhage, hydrocephalus, extra-axial collection or mass lesion/mass effect. Similar cerebral atrophy with ex vacuo ventricular dilation. Mild white matter hypodensities, likely related to chronic microvascular ischemic disease. Vascular: Calcific atherosclerosis. Skull: No acute fracture. Sinuses/Orbits: Left maxillary sinus retention cyst versus polyp. Unremarkable orbits. Other: No mastoid effusions. IMPRESSION: 1. No evidence of acute intracranial abnormality. 2. Similar cerebral atrophy and ventriculomegaly. Electronically Signed   By: Margaretha Sheffield MD   On: 04/14/2020 16:08   CT Head Wo Contrast  Result Date: 04/03/2020 CLINICAL DATA:  Sudden onset of leg weakness today. EXAM: CT HEAD WITHOUT CONTRAST TECHNIQUE: Contiguous axial images were obtained from the base of the skull through the vertex without intravenous contrast. COMPARISON:  None. FINDINGS: Brain: Age advanced cerebral atrophy, ventriculomegaly and mild periventricular white matter disease. No extra-axial fluid collections are identified. No CT findings for acute hemispheric infarction or intracranial hemorrhage. No mass lesions. The brainstem and cerebellum are normal. Vascular: Moderate to advanced vascular calcifications but no aneurysm or hyperdense vessels. Skull: No skull fracture or bone lesions. Sinuses/Orbits: The paranasal sinuses and mastoid air cells are clear except for a 2 cm mucous retention cyst or polyp in the left maxillary sinus. The globes are intact. Other: No scalp lesions or scalp hematoma. IMPRESSION: 1. Age advanced cerebral atrophy, ventriculomegaly and mild periventricular white matter disease. 2. No acute intracranial findings or mass lesions. Electronically Signed   By: Marijo Sanes M.D.   On: 04/03/2020 19:14   US RENAL  Result Date: 04/04/2020 CLINICAL  DATA:  Acute kidney injury EXAM: RENAL / URINARY TRACT ULTRASOUND COMPLETE COMPARISON:  CT 04/03/2020 FINDINGS: Technical note: Significantly limited examination secondary to poor  penetration related to patient body habitus and shadowing from overlying bowel gas. Right Kidney: Limited. Echogenic renal parenchyma with cortical thinning compatible with chronic medical renal disease. Right renal length of approximately 10.6 cm. No shadowing stone or hydronephrosis is identified. Left Kidney: Very limited evaluation. Visualized left kidney appears diffusely echogenic with renal cortical thinning compatible with chronic medical renal disease. No obvious hydronephrosis. Bladder: Appears normal for degree of bladder distention. Other: None. IMPRESSION: 1. Limited exam. 2. No sonographic evidence of obstructive uropathy. 3. Atrophic, echogenic kidneys compatible with chronic medical renal disease. Electronically Signed   By: Davina Poke D.O.   On: 04/04/2020 12:18   DG CHEST PORT 1 VIEW  Result Date: 04/11/2020 CLINICAL DATA:  Hemodialysis catheter placement EXAM: PORTABLE CHEST 1 VIEW COMPARISON:  April 11, 2020 FINDINGS: The heart size is enlarged. Bilateral airspace disease is again noted and has progressed in the right mid and right upper lung zones. There is vascular congestion with small bilateral pleural effusions. There is a right-sided central venous catheter with tip projecting over the mid SVC. The catheter fall short of the cavoatrial junction by approximately 6-7 cm. There is no evidence for pneumothorax. Aortic calcifications are noted. The main pulmonary artery is dilated. IMPRESSION: 1. Interval worsening of bilateral airspace disease, greatest in the right mid and right upper lung zones. 2. Hemodialysis catheter as above. While the tip likely terminates in the SVC, a catheter fall short of the cavoatrial junction by at least 6 cm. 3. Persistent cardiomegaly with vascular congestion and small  bilateral pleural effusions. Electronically Signed   By: Constance Holster M.D.   On: 04/11/2020 17:23   DG Chest Port 1 View  Result Date: 04/11/2020 CLINICAL DATA:  Shortness of breath. Cough. Hypoxia. COVID-19 virus infection. EXAM: PORTABLE CHEST 1 VIEW COMPARISON:  04/03/2020 FINDINGS: Heart size remains normal. New mild airspace opacity is seen in the central right upper lobe and peripheral left upper lobe. No evidence of pleural effusion. IMPRESSION: New mild bilateral upper lobe airspace disease, suspicious for viral pneumonia. Electronically Signed   By: Marlaine Hind M.D.   On: 04/11/2020 11:30   DG Chest Port 1 View  Result Date: 04/03/2020 CLINICAL DATA:  Right lower lobe infiltrate EXAM: PORTABLE CHEST 1 VIEW COMPARISON:  Same day CT abdomen pelvis, 04/03/2020, chest radiographs, 04/05/2014 FINDINGS: The heart size and mediastinal contours are within normal limits. Subtle heterogeneous airspace opacity of the right lung base. The visualized skeletal structures are unremarkable. IMPRESSION: Subtle heterogeneous airspace opacity of the right lung base, in keeping with findings of prior CT and consistent with infection or aspiration. Electronically Signed   By: Eddie Candle M.D.   On: 04/03/2020 20:23   ECHOCARDIOGRAM COMPLETE  Result Date: 04/04/2020    ECHOCARDIOGRAM REPORT   Patient Name:   AILEANA HODDER Stahl Date of Exam: 04/04/2020 Medical Rec #:  [id number]     Height:       62.0 in Accession #:    [id number]    Weight:       211.2 lb Date of Birth:  10-24-1950     BSA:          1.956 m Patient Age:    68 years      BP:           146/78 mmHg Patient Gender: F             HR:           82 bpm. Exam Location:  Inpatient Procedure: 2D Echo, Cardiac Doppler, Color Doppler and Intracardiac            Opacification Agent Indications:    Abnormal ECG 794.31 / R94.31  History:        Patient has prior history of Echocardiogram examinations, most                 recent 11/11/1996. CAD,  Signs/Symptoms:Shortness of Breath and                 Chest Pain; Risk Factors:Hypertension, Diabetes and                 Dyslipidemia.  Sonographer:    Bernadene Person RDCS Referring Phys: [id number] [place]  1. Left ventricular ejection fraction, by estimation, is 60 to 65%. The left ventricle has normal function. The left ventricle has no regional wall motion abnormalities. Left ventricular diastolic parameters are consistent with Grade II diastolic dysfunction (pseudonormalization). Elevated left ventricular end-diastolic pressure.  2. Right ventricular systolic function is normal. The right ventricular size is normal. There is normal pulmonary artery systolic pressure.  3. The mitral valve is degenerative. Trivial mitral valve regurgitation. No evidence of mitral stenosis. Moderate mitral annular calcification.  4. The aortic valve is tricuspid. Aortic valve regurgitation is not visualized. Mild to moderate aortic valve sclerosis/calcification is present, without any evidence of aortic stenosis.  5. The inferior vena cava is normal in size with greater than 50% respiratory variability, suggesting right atrial pressure of 3 mmHg. FINDINGS  Left Ventricle: Left ventricular ejection fraction, by estimation, is 60 to 65%. The left ventricle has normal function. The left ventricle has no regional wall motion abnormalities. Definity contrast agent was given IV to delineate the left ventricular  endocardial borders. The left ventricular internal cavity size was normal in size. There is no left ventricular hypertrophy. Left ventricular diastolic parameters are consistent with Grade II diastolic dysfunction (pseudonormalization). Elevated left ventricular end-diastolic pressure. Right Ventricle: The right ventricular size is normal. No increase in right ventricular wall thickness. Right ventricular systolic function is normal. There is normal pulmonary artery systolic pressure. The tricuspid  regurgitant velocity is 2.75 m/s, and  with an assumed right atrial pressure of 3 mmHg, the estimated right ventricular systolic pressure is 85.2 mmHg. Left Atrium: Left atrial size was normal in size. Right Atrium: Right atrial size was normal in size. Pericardium: There is no evidence of pericardial effusion. Mitral Valve: The mitral valve is degenerative in appearance. There is moderate thickening of the mitral valve leaflet(s). There is moderate calcification of the mitral valve leaflet(s). Moderate mitral annular calcification. Trivial mitral valve regurgitation. No evidence of mitral valve stenosis. Tricuspid Valve: The tricuspid valve is normal in structure. Tricuspid valve regurgitation is mild . No evidence of tricuspid stenosis. Aortic Valve: The aortic valve is tricuspid. Aortic valve regurgitation is not visualized. Mild to moderate aortic valve sclerosis/calcification is present, without any evidence of aortic stenosis. Pulmonic Valve: The pulmonic valve was normal in structure. Pulmonic valve regurgitation is not visualized. No evidence of pulmonic stenosis. Aorta: The aortic root is normal in size and structure. Venous: The inferior vena cava is normal in size with greater than 50% respiratory variability, suggesting right atrial pressure of 3 mmHg. IAS/Shunts: No atrial level shunt detected by color flow Doppler.  LEFT VENTRICLE PLAX 2D LVIDd:         5.80 cm  Diastology LVIDs:         4.10 cm  LV e' medial:    3.81 cm/s LV PW:         0.70 cm  LV E/e' medial:  24.3 LV IVS:        1.00 cm  LV e' lateral:   4.57 cm/s LVOT diam:     2.00 cm  LV E/e' lateral: 20.2 LV SV:         78 LV SV Index:   40 LVOT Area:     3.14 cm  RIGHT VENTRICLE RV S prime:     13.10 cm/s TAPSE (M-mode): 2.0 cm LEFT ATRIUM             Index       RIGHT ATRIUM           Index LA diam:        3.40 cm 1.74 cm/m  RA Area:     18.10 cm LA Vol (A2C):   56.0 ml 28.62 ml/m RA Volume:   47.40 ml  24.23 ml/m LA Vol (A4C):   51.1 ml  26.12 ml/m LA Biplane Vol: 53.4 ml 27.29 ml/m  AORTIC VALVE LVOT Vmax:   104.00 cm/s LVOT Vmean:  86.300 cm/s LVOT VTI:    0.249 m  AORTA Ao Root diam: 3.30 cm Ao Asc diam:  3.60 cm MITRAL VALVE                TRICUSPID VALVE MV Area (PHT): 2.48 cm     TR Peak grad:   30.2 mmHg MV Decel Time: 306 msec     TR Vmax:        275.00 cm/s MV E velocity: 92.40 cm/s MV A velocity: 138.00 cm/s  SHUNTS MV E/A ratio:  0.67         Systemic VTI:  0.25 m                             Systemic Diam: 2.00 cm Jenkins Rouge MD Electronically signed by Jenkins Rouge MD Signature Date/Time: 04/04/2020/11:23:04 AM    Final    VAS [place] LOWER EXTREMITY VENOUS (DVT)  Result Date: 04/13/2020  Lower Venous DVT Study Indications: Covid +, elevated D dimer.  Limitations: Body habitus, cooperation and poor ultrasound/tissue interface. Performing Technologist: Antonieta Pert RDMS, RVT  Examination Guidelines: A complete evaluation includes B-mode imaging, spectral Doppler, color Doppler, and power Doppler as needed of all accessible portions of each vessel. Bilateral testing is considered an integral part of a complete examination. Limited examinations for reoccurring indications may be performed as noted. The reflux portion of the exam is performed with the patient in reverse Trendelenburg.  +---------+---------------+---------+-----------+----------+--------------+ RIGHT    CompressibilityPhasicitySpontaneityPropertiesThrombus Aging +---------+---------------+---------+-----------+----------+--------------+ CFV      Full           Yes      Yes                                 +---------+---------------+---------+-----------+----------+--------------+ SFJ      Full                                                        +---------+---------------+---------+-----------+----------+--------------+ FV Prox  Full                                                         +---------+---------------+---------+-----------+----------+--------------+  FV Mid   Full                                                        +---------+---------------+---------+-----------+----------+--------------+ FV DistalFull                                                        +---------+---------------+---------+-----------+----------+--------------+ PFV      Full                                                        +---------+---------------+---------+-----------+----------+--------------+ POP      Full           Yes      Yes                                 +---------+---------------+---------+-----------+----------+--------------+ PTV      Full                                                        +---------+---------------+---------+-----------+----------+--------------+ PERO     Partial                            dilated   Acute          +---------+---------------+---------+-----------+----------+--------------+ GSV      Full                                                        +---------+---------------+---------+-----------+----------+--------------+   +---------+---------------+---------+-----------+----------+--------------+ LEFT     CompressibilityPhasicitySpontaneityPropertiesThrombus Aging +---------+---------------+---------+-----------+----------+--------------+ CFV      Full           Yes      Yes                                 +---------+---------------+---------+-----------+----------+--------------+ SFJ      Full                                                        +---------+---------------+---------+-----------+----------+--------------+ FV Prox  Full                                                        +---------+---------------+---------+-----------+----------+--------------+  FV Mid   Full                                                         +---------+---------------+---------+-----------+----------+--------------+ FV DistalFull                                                        +---------+---------------+---------+-----------+----------+--------------+ PFV      Full                                                        +---------+---------------+---------+-----------+----------+--------------+ POP      Full           Yes      Yes                                 +---------+---------------+---------+-----------+----------+--------------+ PTV      Full                                                        +---------+---------------+---------+-----------+----------+--------------+ PERO     Full                                                        +---------+---------------+---------+-----------+----------+--------------+ GSV      Full                                                        +---------+---------------+---------+-----------+----------+--------------+     Summary: RIGHT: - Findings consistent with acute deep vein thrombosis involving the right peroneal veins. - No cystic structure found in the popliteal fossa. - Small segment of thrombus in the right calf.  LEFT: - There is no evidence of deep vein thrombosis in the lower extremity. However, portions of this examination were limited- see technologist comments above.  - No cystic structure found in the popliteal fossa. - Difficult exam bilaterally due to large body habitus, poor penetration and poor patient cooperation.  *See table(s) above for measurements and observations. Electronically signed by Harold Barban MD on 04/13/2020 at 6:07:50 PM.    Final

## 2020-04-17 DIAGNOSIS — I10 Essential (primary) hypertension: Secondary | ICD-10-CM

## 2020-04-17 LAB — GLUCOSE, CAPILLARY
Glucose-Capillary: 208 mg/dL — ABNORMAL HIGH (ref 70–99)
Glucose-Capillary: 226 mg/dL — ABNORMAL HIGH (ref 70–99)
Glucose-Capillary: 287 mg/dL — ABNORMAL HIGH (ref 70–99)
Glucose-Capillary: 213 mg/dL — ABNORMAL HIGH (ref 70–99)

## 2020-04-17 MED ORDER — HEPARIN SODIUM (PORCINE) 1000 UNIT/ML DIALYSIS: 20.0000 [IU]/kg | INTRAMUSCULAR | Status: DC | PRN

## 2020-04-17 MED ORDER — LORATADINE 10 MG PO TABS
10.0000 mg | ORAL_TABLET | Freq: Every day | ORAL | Status: DC
  Administered 2020-04-17 – 2020-04-21 (×5): 10 mg via ORAL
  Filled 2020-04-17 (×5): qty 1

## 2020-04-17 NOTE — Progress Notes (Signed)
PROGRESS NOTE                                                                             PROGRESS NOTE                                                                                                                                                                                                             Patient Demographics:    Ann [name], is a 69 y.o. female, DOB - October 19, 1950, FUX:[id number]  Outpatient Primary MD for the patient is Ann Ebbs, MD    LOS - 6  Admit date - 04/11/2020    Chief Complaint  Patient presents with  . Covid Positive       Brief Narrative    HPI: Ann [name] is a 69 y.o. female with history h/o stage IV CKD with associated anemia, CAD, T2DM, HTN, HLD, GERD, asthma, OSA on CPAP, chronic lumbar back pain who was recently admitted 12/11-12/17/21 to [facility] for COVID-19 related GI/resp symptoms treated with Remdesevir ( steroids not given as no hypoxia throughout the stay) presents to ED today with worsening symptoms. In the ED 12/11, she was felt to be dehydrated in addition to CKD baseline creatinine 2.7-3.3, creatinine was 5.3 on admission,did peak at 6.5,  plateaued around 6.2 range for 3 days prior to discharge. Diuretics and ACEiwere held on discharge and nephrology follow up arranged. She was deconditioned at discharge but declined SNF , went home with [facility].  Per daughter, patient has been eating poorly and sleeping most of the time since last hospital discharge.  She also appeared to be having labored breathing and unable to perform any of her ADLs.  Daughter asking if staff here can help her take a bath as she has been unable to get her to the bathroom.  Patient currently oriented to self/person only but not to place or time.  She is unable to provide any details regarding hospital visit but does report productive cough and shortness of breath.  ED findings today: Afebrile, SBP  1 20-160, RR 15-19, hypoxic on arrival  requiring NRB M transiently-transitioned to 3 L O2 now.  Lab work shows worsening renal function with BUN 86->103, creatinine 6.26->8.99, bicarb 14, potassium 4.6, albumin 2.6->2.3.  CRP 20.6, procalcitonin 2.68, lactate 1.7.  Ferritin 1320, LDH 281, triglyceride 134, D-dimer 2.6->6.0 WBC 10.3, hemoglobin 9.0, platelet 321.  Chest x-ray shows "new mild bilateral upper lobe airspace disease, suspicious for viral pneumonia."  She received 10 mg IV Decadron x1 and on IV fluids NS at 100 mL/h.  Per EDP, nephrology has been consulted and patient requested to be admitted for further evaluation and management.    Subjective:    Ann [name] today denies any complaints, reports good night sleep, no nausea, no vomiting, discussed with staff, mentation is appropriate, no further confusion episodes.     Assessment  & Plan :    Principal Problem:   Acute respiratory failure with hypoxia (HCC) Active Problems:   HYPERTENSION   CAD (coronary artery disease)   Obesity, Class III, BMI 40-49.9 (morbid obesity) ([place])   Acute encephalopathy   Chronic pain   2019 novel coronavirus disease (COVID-19)   GERD (gastroesophageal reflux disease)   AKI (acute kidney injury) ([place])  Acute Hypoxic Resp. Failure due to Acute Covid 19 Viral Pneumonitis during the ongoing 2020 Covid 19 Pandemic -  -She is unvaccinated -He was treated with steroids and Remdesivir during recent hospitalization, was started on steroids, no hypoxia, steroid has been stopped -Received Actemra on admission. -Was encouraged to use incentive spirometry and flutter valve. - she is currently on room air.  Acute DVT -Elevated d D-dimers, venous Doppler was obtained she is having right peroneal veins DVT, given her Covid infection, and hypercoagulable state, high risk for VTE, continue with full anticoagulation with Eliquis.  Progressive AKI on CKD stage IV:  -Management per renal, started on hemodialysis this hospital stay, likely will be ESRD  and need dialysis long-term.    Acute metabolic encephalopathy: - Likely related to significant uremia (greater than 100) and COVID-19 infection.  Patient has been improving gradually, CT head with no acute findings.   -Has resolved after few hemodialysis sessions.  Diabetes mellitus type 2:  -CBG currently acceptable on Levemir and sliding scale   Anemia -The setting of chronic kidney disease, she has 1 unit PRBC 12/20 with good response.  CAD: Resume home medications, aspirin, beta-blockers and statins.  Hypertension:  -Resume home medications, creased home dose Norvasc to 5 mg and titrate as needed.  Diuretics/ACE inhibitors held in last admission  Chronic pain: Hold Neurontin/Cymbalta for now until mental status improves.  GERD: Resume PPI unless otherwise advised by nephrology.  Obesity class III BMI 40-49.9: Follow-up PCP, weight modifying recommendations upon discharge    SpO2: 97 % O2 Flow Rate (L/min): 3 L/min  Recent Labs  Lab 04/11/20 1031 04/11/20 1032 04/11/20 1032 04/11/20 1712 04/12/20 0416 04/13/20 0839 04/14/20 0145 04/15/20 0624 04/16/20 0214  WBC  --  10.3   < > 10.7* 10.0 10.3 9.7 10.4 7.7  PLT  --  321   < > 286 306 266 275 256 204  CRP  --  20.6*  --   --   --   --   --   --   --   DDIMER  --  6.00*  --   --  7.15* 5.31* 3.85*  --   --   PROCALCITON  --  2.68  --   --   --   --   --   --   --  AST  --  52*   < >  --  37 27 21 25 24   ALT  --  34   < >  --  29 24 22 22 23   ALKPHOS  --  73   < >  --  70 67 70 60 57  BILITOT  --  0.9   < >  --  0.6 0.8 0.6 0.9 0.6  ALBUMIN  --  2.3*   < >  --  2.2* 2.4* 2.5* 2.5* 2.4*  LATICACIDVEN 1.7  --   --  1.1  --   --   --   --   --    < > = values in this interval not displayed.       ABG     Component Value Date/Time   TCO2 20 09/27/2008 1636       Condition - Extremely Guarded  Family Communication  : Discussed with daughter Ann [name] by phone 08/65/7846, 12/22, left voicemail 12/23   updated 12/24.  Code Status :  Full  Consults  :  Renal, PCCM  Procedures  : A hemodialysis catheter placement by PCCM 12/19, 1 unit PRBC transfusion on 12/20  Disposition Plan  :    Status is: Inpatient  Remains inpatient appropriate because:Hemodynamically unstable   Dispo: The patient is from: Home              Anticipated d/c is to: SNF              Anticipated d/c date is: > 3 days              Patient currently is not medically stable to d/c.      DVT Prophylaxis  :  Heparin>>Eliquis  Lab Results  Component Value Date   PLT 204 04/16/2020    Diet :  Diet Order            Diet renal/carb modified with fluid restriction Diet-HS Snack? Nothing; Fluid restriction: 1200 mL Fluid; Room service appropriate? No; Fluid consistency: Thin  Diet effective now                  Inpatient Medications  Scheduled Meds: . sodium chloride   Intravenous Once  . amLODipine  5 mg Oral Daily  . apixaban  10 mg Oral BID   Followed by  . [START ON 04/20/2020] apixaban  5 mg Oral BID  . vitamin C  500 mg Oral Daily  . Chlorhexidine Gluconate Cloth  6 each Topical Q0600  . Chlorhexidine Gluconate Cloth  6 each Topical Q0600  . darbepoetin (ARANESP) injection - DIALYSIS  60 mcg Intravenous Q Fri-HD  . hydrALAZINE  50 mg Oral TID  . insulin aspart  0-6 Units Subcutaneous TID WC  . insulin detemir  10 Units Subcutaneous Daily  . Ipratropium-Albuterol  1 puff Inhalation Q6H  . metoprolol tartrate  25 mg Oral BID  . nystatin   Topical BID  . pantoprazole  20 mg Oral Daily  . pravastatin  40 mg Oral QPM  . rOPINIRole  0.25 mg Oral QPM  . sodium bicarbonate  650 mg Oral BID   Continuous Infusions:  PRN Meds:.albuterol, bisacodyl, guaiFENesin-dextromethorphan, lip balm, ondansetron **OR** ondansetron (ZOFRAN) IV, sodium chloride flush  Antibiotics  :    Anti-infectives (From admission, onward)   Start     Dose/Rate Route Frequency Ordered Stop   04/11/20 1300  cefTRIAXone  (ROCEPHIN) 1 g in sodium chloride 0.9 % 100  mL IVPB  Status:  Discontinued        1 g 200 mL/hr over 30 Minutes Intravenous Every 24 hours 04/11/20 1252 04/13/20 1418   04/11/20 1300  doxycycline (VIBRA-TABS) tablet 100 mg  Status:  Discontinued        100 mg Oral Every 12 hours 04/11/20 1252 04/13/20 1418        Pearse Shiffler M.D on 04/17/2020 at 12:07 PM  To page go to [email or web address]  [facility] -  Office  [phone]       Objective:   Vitals:   04/16/20 2344 04/17/20 0336 04/17/20 0747 04/17/20 1140  BP: 122/68 126/79 (!) 177/90 112/81  Pulse: 77 68 75 91  Resp: 15 13 12 14   Temp: 98.1 F (36.7 C) 98 F (36.7 C) 98.2 F (36.8 C) 98.8 F (37.1 C)  TempSrc: Oral Oral Axillary Oral  SpO2: 97% 93% 97% 97%  Weight:        Wt Readings from Last 3 Encounters:  04/16/20 98.6 kg  04/09/20 99 kg  02/25/19 115 kg     Intake/Output Summary (Last 24 hours) at 04/17/2020 1207 Last data filed at 04/17/2020 0900 Gross per 24 hour  Intake 360 ml  Output 1600 ml  Net -1240 ml     Physical Exam  Awake Alert, Oriented X 3, No new F.N deficits, Normal affect Symmetrical Chest wall movement, Good air movement bilaterally, CTAB RRR,No Gallops,Rubs or new Murmurs, No Parasternal Heave +ve B.Sounds, Abd Soft, No tenderness, No rebound - guarding or rigidity. No Cyanosis, Clubbing or edema, No new Rash or bruise        Data Review:    CBC Recent Labs  Lab 04/12/20 0416 04/13/20 0839 04/14/20 0145 04/15/20 0624 04/16/20 0214  WBC 10.0 10.3 9.7 10.4 7.7  HGB 7.7* 9.6* 10.7* 10.5* 10.6*  HCT 25.0* 30.0* 32.5* 30.3* 31.9*  PLT 306 266 275 256 204  MCV 91.2 87.0 88.6 84.4 85.1  MCH 28.1 27.8 29.2 29.2 28.3  MCHC 30.8 32.0 32.9 34.7 33.2  RDW 16.2* 15.5 15.8* 15.3 14.8  LYMPHSABS 0.6* 0.5* 0.4* 0.7 1.0  MONOABS 0.2 0.3 0.3 0.5 0.5  EOSABS 0.0 0.0 0.0 0.0 0.0  BASOSABS 0.0 0.0 0.0 0.0 0.0    Recent Labs  Lab 04/11/20 1031 04/11/20 1032  04/11/20 1032 04/11/20 1712 04/12/20 0416 04/13/20 0839 04/14/20 0145 04/15/20 0624 04/16/20 0214  NA  --  141   < >  --  142 142 140 139 141  K  --  4.6   < >  --  5.0 4.4 4.4 3.9 3.6  CL  --  109   < >  --  110 104 105 103 104  CO2  --  14*   < >  --  15* 18* 19* 24 25  GLUCOSE  --  203*   < >  --  212* 274* 397* 175* 192*  BUN  --  103*   < >  --  111* 80* 91* 49* 53*  CREATININE  --  8.99*   < >  --  9.47* 6.80* 6.78* 3.89* 4.22*  CALCIUM  --  8.5*   < >  --  8.7* 8.7* 8.4* 8.0* 8.2*  AST  --  52*   < >  --  37 27 21 25 24   ALT  --  34   < >  --  29 24 22 22 23   ALKPHOS  --  73   < >  --  70 67 70 60 57  BILITOT  --  0.9   < >  --  0.6 0.8 0.6 0.9 0.6  ALBUMIN  --  2.3*   < >  --  2.2* 2.4* 2.5* 2.5* 2.4*  MG  --   --   --   --  2.4  --   --   --   --   CRP  --  20.6*  --   --   --   --   --   --   --   DDIMER  --  6.00*  --   --  7.15* 5.31* 3.85*  --   --   PROCALCITON  --  2.68  --   --   --   --   --   --   --   LATICACIDVEN 1.7  --   --  1.1  --   --   --   --   --    < > = values in this interval not displayed.    ------------------------------------------------------------------------------------------------------------------ No results for input(s): CHOL, HDL, LDLCALC, TRIG, CHOLHDL, LDLDIRECT in the last 72 hours.  Lab Results  Component Value Date   HGBA1C 6.6 (H) 04/04/2020   ------------------------------------------------------------------------------------------------------------------ No results for input(s): TSH, T4TOTAL, T3FREE, THYROIDAB in the last 72 hours.  Invalid input(s): FREET3  Cardiac Enzymes No results for input(s): CKMB, TROPONINI, MYOGLOBIN in the last 168 hours.  Invalid input(s): CK ------------------------------------------------------------------------------------------------------------------ No results found for: BNP  Micro Results Recent Results (from the past 240 hour(s))  Blood Culture (routine x 2)     Status: None    Collection Time: 04/11/20 10:18 AM   Specimen: BLOOD  Result Value Ref Range Status   Specimen Description BLOOD RIGHT ANTECUBITAL  Final   Special Requests   Final    BOTTLES DRAWN AEROBIC ONLY Blood Culture adequate volume   Culture   Final    NO GROWTH 5 DAYS Performed at [facility], [address] [place], [place] [address]    Report Status 04/16/2020 FINAL  Final  Blood Culture (routine x 2)     Status: None   Collection Time: 04/11/20 10:23 AM   Specimen: BLOOD  Result Value Ref Range Status   Specimen Description BLOOD LEFT ANTECUBITAL  Final   Special Requests   Final    BOTTLES DRAWN AEROBIC AND ANAEROBIC Blood Culture adequate volume   Culture   Final    NO GROWTH 5 DAYS Performed at [facility], [place] [address], [place], [place] [address]    Report Status 04/16/2020 FINAL  Final  MRSA PCR Screening     Status: None   Collection Time: 04/11/20 12:56 PM   Specimen: Nasal Mucosa; Nasopharyngeal  Result Value Ref Range Status   MRSA by PCR NEGATIVE NEGATIVE Final    Comment:        The GeneXpert MRSA Assay (FDA approved for NASAL specimens only), is one component of a comprehensive MRSA colonization surveillance program. It is not intended to diagnose MRSA infection nor to guide or monitor treatment for MRSA infections. Performed at [facility], [place] [address], [place], [place] [address]     Radiology Reports CT ABDOMEN PELVIS WO CONTRAST  Result Date: 04/03/2020 CLINICAL DATA:  Abdominal pain. EXAM: CT ABDOMEN AND PELVIS WITHOUT CONTRAST TECHNIQUE: Multidetector CT imaging of the abdomen and pelvis was performed following the standard protocol without IV contrast. COMPARISON:  02/20/2019 FINDINGS: Lower chest: Patchy  ground-glass infiltrates in the right lower lobe could be due to developing pneumonia, atypical pneumonia or acute aspiration. No pleural effusions. The heart is normal in size. No pericardial effusion. Three-vessel coronary  artery calcifications are noted. Small hiatal hernia. Hepatobiliary: No hepatic lesions are identified without contrast. No intrahepatic biliary dilatation. The gallbladder is surgically absent. No common bile duct dilatation. Pancreas: Marked fatty atrophy of the pancreas. No mass, inflammation or ductal dilatation. Spleen: Normal size.  No focal lesions. Adrenals/Urinary Tract: The adrenal glands are unremarkable. Small, chronically atrophied kidneys consistent with chronic renal failure. No worrisome renal lesions or hydroureteronephrosis. The bladder is unremarkable. Stomach/Bowel: The stomach, duodenum, small bowel and colon are unremarkable. No acute inflammatory changes, mass lesions or obstructive findings. The terminal ileum is normal. Scattered colonic diverticulosis but no findings for acute diverticulitis. Vascular/Lymphatic: Significant diffuse vascular calcifications. No aneurysm. No mesenteric or retroperitoneal mass or adenopathy. No free air or free fluid. No leaking oral contrast. Reproductive: The uterus is surgically absent. Both ovaries are still present and appear normal. Other: Moderate-sized periumbilical abdominal wall hernia containing fat. Musculoskeletal: No significant bony findings. IMPRESSION: 1. No acute abdominal/pelvic findings, mass lesions or adenopathy. 2. Patchy ground-glass infiltrates in the right lower lobe could be due to developing pneumonia, atypical pneumonia or acute aspiration. 3. Status post cholecystectomy. No biliary dilatation. 4. Small, chronically atrophied kidneys consistent with chronic renal failure. 5. Moderate-sized periumbilical abdominal wall hernia containing fat. 6. Aortic atherosclerosis. Aortic Atherosclerosis (ICD10-I70.0). Electronically Signed   By: Marijo Sanes M.D.   On: 04/03/2020 19:19   CT HEAD WO CONTRAST  Result Date: 04/14/2020 CLINICAL DATA:  Delirium.  Altered mental status. EXAM: CT HEAD WITHOUT CONTRAST TECHNIQUE: Contiguous axial  images were obtained from the base of the skull through the vertex without intravenous contrast. COMPARISON:  April 03, 2020. FINDINGS: Brain: No evidence of acute large vascular territory infarction, hemorrhage, hydrocephalus, extra-axial collection or mass lesion/mass effect. Similar cerebral atrophy with ex vacuo ventricular dilation. Mild white matter hypodensities, likely related to chronic microvascular ischemic disease. Vascular: Calcific atherosclerosis. Skull: No acute fracture. Sinuses/Orbits: Left maxillary sinus retention cyst versus polyp. Unremarkable orbits. Other: No mastoid effusions. IMPRESSION: 1. No evidence of acute intracranial abnormality. 2. Similar cerebral atrophy and ventriculomegaly. Electronically Signed   By: Margaretha Sheffield MD   On: 04/14/2020 16:08   CT Head Wo Contrast  Result Date: 04/03/2020 CLINICAL DATA:  Sudden onset of leg weakness today. EXAM: CT HEAD WITHOUT CONTRAST TECHNIQUE: Contiguous axial images were obtained from the base of the skull through the vertex without intravenous contrast. COMPARISON:  None. FINDINGS: Brain: Age advanced cerebral atrophy, ventriculomegaly and mild periventricular white matter disease. No extra-axial fluid collections are identified. No CT findings for acute hemispheric infarction or intracranial hemorrhage. No mass lesions. The brainstem and cerebellum are normal. Vascular: Moderate to advanced vascular calcifications but no aneurysm or hyperdense vessels. Skull: No skull fracture or bone lesions. Sinuses/Orbits: The paranasal sinuses and mastoid air cells are clear except for a 2 cm mucous retention cyst or polyp in the left maxillary sinus. The globes are intact. Other: No scalp lesions or scalp hematoma. IMPRESSION: 1. Age advanced cerebral atrophy, ventriculomegaly and mild periventricular white matter disease. 2. No acute intracranial findings or mass lesions. Electronically Signed   By: Marijo Sanes M.D.   On: 04/03/2020  19:14   US RENAL  Result Date: 04/04/2020 CLINICAL DATA:  Acute kidney injury EXAM: RENAL / URINARY TRACT ULTRASOUND COMPLETE COMPARISON:  CT 04/03/2020 FINDINGS:  Technical note: Significantly limited examination secondary to poor penetration related to patient body habitus and shadowing from overlying bowel gas. Right Kidney: Limited. Echogenic renal parenchyma with cortical thinning compatible with chronic medical renal disease. Right renal length of approximately 10.6 cm. No shadowing stone or hydronephrosis is identified. Left Kidney: Very limited evaluation. Visualized left kidney appears diffusely echogenic with renal cortical thinning compatible with chronic medical renal disease. No obvious hydronephrosis. Bladder: Appears normal for degree of bladder distention. Other: None. IMPRESSION: 1. Limited exam. 2. No sonographic evidence of obstructive uropathy. 3. Atrophic, echogenic kidneys compatible with chronic medical renal disease. Electronically Signed   By: Davina Poke D.O.   On: 04/04/2020 12:18   DG CHEST PORT 1 VIEW  Result Date: 04/11/2020 CLINICAL DATA:  Hemodialysis catheter placement EXAM: PORTABLE CHEST 1 VIEW COMPARISON:  April 11, 2020 FINDINGS: The heart size is enlarged. Bilateral airspace disease is again noted and has progressed in the right mid and right upper lung zones. There is vascular congestion with small bilateral pleural effusions. There is a right-sided central venous catheter with tip projecting over the mid SVC. The catheter fall short of the cavoatrial junction by approximately 6-7 cm. There is no evidence for pneumothorax. Aortic calcifications are noted. The main pulmonary artery is dilated. IMPRESSION: 1. Interval worsening of bilateral airspace disease, greatest in the right mid and right upper lung zones. 2. Hemodialysis catheter as above. While the tip likely terminates in the SVC, a catheter fall short of the cavoatrial junction by at least 6 cm. 3.  Persistent cardiomegaly with vascular congestion and small bilateral pleural effusions. Electronically Signed   By: Constance Holster M.D.   On: 04/11/2020 17:23   DG Chest Port 1 View  Result Date: 04/11/2020 CLINICAL DATA:  Shortness of breath. Cough. Hypoxia. COVID-19 virus infection. EXAM: PORTABLE CHEST 1 VIEW COMPARISON:  04/03/2020 FINDINGS: Heart size remains normal. New mild airspace opacity is seen in the central right upper lobe and peripheral left upper lobe. No evidence of pleural effusion. IMPRESSION: New mild bilateral upper lobe airspace disease, suspicious for viral pneumonia. Electronically Signed   By: Marlaine Hind M.D.   On: 04/11/2020 11:30   DG Chest Port 1 View  Result Date: 04/03/2020 CLINICAL DATA:  Right lower lobe infiltrate EXAM: PORTABLE CHEST 1 VIEW COMPARISON:  Same day CT abdomen pelvis, 04/03/2020, chest radiographs, 04/05/2014 FINDINGS: The heart size and mediastinal contours are within normal limits. Subtle heterogeneous airspace opacity of the right lung base. The visualized skeletal structures are unremarkable. IMPRESSION: Subtle heterogeneous airspace opacity of the right lung base, in keeping with findings of prior CT and consistent with infection or aspiration. Electronically Signed   By: Eddie Candle M.D.   On: 04/03/2020 20:23   ECHOCARDIOGRAM COMPLETE  Result Date: 04/04/2020    ECHOCARDIOGRAM REPORT   Patient Name:   ORLEAN HOLTROP Oscar Date of Exam: 04/04/2020 Medical Rec #:  [id number]     Height:       62.0 in Accession #:    [id number]    Weight:       211.2 lb Date of Birth:  1950/07/27     BSA:          1.956 m Patient Age:    23 years      BP:           146/78 mmHg Patient Gender: F             HR:  82 bpm. Exam Location:  Inpatient Procedure: 2D Echo, Cardiac Doppler, Color Doppler and Intracardiac            Opacification Agent Indications:    Abnormal ECG 794.31 / R94.31  History:        Patient has prior history of Echocardiogram examinations,  most                 recent 11/11/1996. CAD, Signs/Symptoms:Shortness of Breath and                 Chest Pain; Risk Factors:Hypertension, Diabetes and                 Dyslipidemia.  Sonographer:    Bernadene Person RDCS Referring Phys: [id number] [place]  1. Left ventricular ejection fraction, by estimation, is 60 to 65%. The left ventricle has normal function. The left ventricle has no regional wall motion abnormalities. Left ventricular diastolic parameters are consistent with Grade II diastolic dysfunction (pseudonormalization). Elevated left ventricular end-diastolic pressure.  2. Right ventricular systolic function is normal. The right ventricular size is normal. There is normal pulmonary artery systolic pressure.  3. The mitral valve is degenerative. Trivial mitral valve regurgitation. No evidence of mitral stenosis. Moderate mitral annular calcification.  4. The aortic valve is tricuspid. Aortic valve regurgitation is not visualized. Mild to moderate aortic valve sclerosis/calcification is present, without any evidence of aortic stenosis.  5. The inferior vena cava is normal in size with greater than 50% respiratory variability, suggesting right atrial pressure of 3 mmHg. FINDINGS  Left Ventricle: Left ventricular ejection fraction, by estimation, is 60 to 65%. The left ventricle has normal function. The left ventricle has no regional wall motion abnormalities. Definity contrast agent was given IV to delineate the left ventricular  endocardial borders. The left ventricular internal cavity size was normal in size. There is no left ventricular hypertrophy. Left ventricular diastolic parameters are consistent with Grade II diastolic dysfunction (pseudonormalization). Elevated left ventricular end-diastolic pressure. Right Ventricle: The right ventricular size is normal. No increase in right ventricular wall thickness. Right ventricular systolic function is normal. There is normal pulmonary  artery systolic pressure. The tricuspid regurgitant velocity is 2.75 m/s, and  with an assumed right atrial pressure of 3 mmHg, the estimated right ventricular systolic pressure is 76.7 mmHg. Left Atrium: Left atrial size was normal in size. Right Atrium: Right atrial size was normal in size. Pericardium: There is no evidence of pericardial effusion. Mitral Valve: The mitral valve is degenerative in appearance. There is moderate thickening of the mitral valve leaflet(s). There is moderate calcification of the mitral valve leaflet(s). Moderate mitral annular calcification. Trivial mitral valve regurgitation. No evidence of mitral valve stenosis. Tricuspid Valve: The tricuspid valve is normal in structure. Tricuspid valve regurgitation is mild . No evidence of tricuspid stenosis. Aortic Valve: The aortic valve is tricuspid. Aortic valve regurgitation is not visualized. Mild to moderate aortic valve sclerosis/calcification is present, without any evidence of aortic stenosis. Pulmonic Valve: The pulmonic valve was normal in structure. Pulmonic valve regurgitation is not visualized. No evidence of pulmonic stenosis. Aorta: The aortic root is normal in size and structure. Venous: The inferior vena cava is normal in size with greater than 50% respiratory variability, suggesting right atrial pressure of 3 mmHg. IAS/Shunts: No atrial level shunt detected by color flow Doppler.  LEFT VENTRICLE PLAX 2D LVIDd:         5.80 cm  Diastology LVIDs:  4.10 cm  LV e' medial:    3.81 cm/s LV PW:         0.70 cm  LV E/e' medial:  24.3 LV IVS:        1.00 cm  LV e' lateral:   4.57 cm/s LVOT diam:     2.00 cm  LV E/e' lateral: 20.2 LV SV:         78 LV SV Index:   40 LVOT Area:     3.14 cm  RIGHT VENTRICLE RV S prime:     13.10 cm/s TAPSE (M-mode): 2.0 cm LEFT ATRIUM             Index       RIGHT ATRIUM           Index LA diam:        3.40 cm 1.74 cm/m  RA Area:     18.10 cm LA Vol (A2C):   56.0 ml 28.62 ml/m RA Volume:   47.40  ml  24.23 ml/m LA Vol (A4C):   51.1 ml 26.12 ml/m LA Biplane Vol: 53.4 ml 27.29 ml/m  AORTIC VALVE LVOT Vmax:   104.00 cm/s LVOT Vmean:  86.300 cm/s LVOT VTI:    0.249 m  AORTA Ao Root diam: 3.30 cm Ao Asc diam:  3.60 cm MITRAL VALVE                TRICUSPID VALVE MV Area (PHT): 2.48 cm     TR Peak grad:   30.2 mmHg MV Decel Time: 306 msec     TR Vmax:        275.00 cm/s MV E velocity: 92.40 cm/s MV A velocity: 138.00 cm/s  SHUNTS MV E/A ratio:  0.67         Systemic VTI:  0.25 m                             Systemic Diam: 2.00 cm Jenkins Rouge MD Electronically signed by Jenkins Rouge MD Signature Date/Time: 04/04/2020/11:23:04 AM    Final    VAS [place] LOWER EXTREMITY VENOUS (DVT)  Result Date: 04/13/2020  Lower Venous DVT Study Indications: Covid +, elevated D dimer.  Limitations: Body habitus, cooperation and poor ultrasound/tissue interface. Performing Technologist: Antonieta Pert RDMS, RVT  Examination Guidelines: A complete evaluation includes B-mode imaging, spectral Doppler, color Doppler, and power Doppler as needed of all accessible portions of each vessel. Bilateral testing is considered an integral part of a complete examination. Limited examinations for reoccurring indications may be performed as noted. The reflux portion of the exam is performed with the patient in reverse Trendelenburg.  +---------+---------------+---------+-----------+----------+--------------+ RIGHT    CompressibilityPhasicitySpontaneityPropertiesThrombus Aging +---------+---------------+---------+-----------+----------+--------------+ CFV      Full           Yes      Yes                                 +---------+---------------+---------+-----------+----------+--------------+ SFJ      Full                                                        +---------+---------------+---------+-----------+----------+--------------+ FV Prox  Full                                                         +---------+---------------+---------+-----------+----------+--------------+  FV Mid   Full                                                        +---------+---------------+---------+-----------+----------+--------------+ FV DistalFull                                                        +---------+---------------+---------+-----------+----------+--------------+ PFV      Full                                                        +---------+---------------+---------+-----------+----------+--------------+ POP      Full           Yes      Yes                                 +---------+---------------+---------+-----------+----------+--------------+ PTV      Full                                                        +---------+---------------+---------+-----------+----------+--------------+ PERO     Partial                            dilated   Acute          +---------+---------------+---------+-----------+----------+--------------+ GSV      Full                                                        +---------+---------------+---------+-----------+----------+--------------+   +---------+---------------+---------+-----------+----------+--------------+ LEFT     CompressibilityPhasicitySpontaneityPropertiesThrombus Aging +---------+---------------+---------+-----------+----------+--------------+ CFV      Full           Yes      Yes                                 +---------+---------------+---------+-----------+----------+--------------+ SFJ      Full                                                        +---------+---------------+---------+-----------+----------+--------------+ FV Prox  Full                                                        +---------+---------------+---------+-----------+----------+--------------+  FV Mid   Full                                                         +---------+---------------+---------+-----------+----------+--------------+ FV DistalFull                                                        +---------+---------------+---------+-----------+----------+--------------+ PFV      Full                                                        +---------+---------------+---------+-----------+----------+--------------+ POP      Full           Yes      Yes                                 +---------+---------------+---------+-----------+----------+--------------+ PTV      Full                                                        +---------+---------------+---------+-----------+----------+--------------+ PERO     Full                                                        +---------+---------------+---------+-----------+----------+--------------+ GSV      Full                                                        +---------+---------------+---------+-----------+----------+--------------+     Summary: RIGHT: - Findings consistent with acute deep vein thrombosis involving the right peroneal veins. - No cystic structure found in the popliteal fossa. - Small segment of thrombus in the right calf.  LEFT: - There is no evidence of deep vein thrombosis in the lower extremity. However, portions of this examination were limited- see technologist comments above.  - No cystic structure found in the popliteal fossa. - Difficult exam bilaterally due to large body habitus, poor penetration and poor patient cooperation.  *See table(s) above for measurements and observations. Electronically signed by Harold Barban MD on 04/13/2020 at 6:07:50 PM.    Final

## 2020-04-17 NOTE — Progress Notes (Signed)
Patient ID: Ann [name], female   DOB: [birth year], 69 y.o.   MRN: [id number]   S: HD done yesterday - removed a liter-  Tolerated well - seems to be doing well clinically - no labs today    O:BP 112/81 (BP Location: Right Arm)   Pulse 91   Temp 98.8 F (37.1 C) (Oral)   Resp 14   Wt 98.6 kg   SpO2 97%   BMI 39.76 kg/m   Intake/Output Summary (Last 24 hours) at 04/17/2020 1540 Last data filed at 04/17/2020 1500 Gross per 24 hour  Intake 600 ml  Output 1600 ml  Net -1000 ml   Intake/Output: I/O last 3 completed shifts: In: 560 [P.O.:560] Out: 1600 [Urine:600; Other:1000]  Intake/Output this shift:  Total I/O In: 360 [P.O.:360] Out: -  Weight change:  Not examined-  Noted reviewed   Recent Labs  Lab 04/11/20 1032 04/12/20 0416 04/13/20 0839 04/14/20 0145 04/15/20 0624 04/16/20 0214  NA 141 142 142 140 139 141  K 4.6 5.0 4.4 4.4 3.9 3.6  CL 109 110 104 105 103 104  CO2 14* 15* 18* 19* 24 25  GLUCOSE 203* 212* 274* 397* 175* 192*  BUN 103* 111* 80* 91* 49* 53*  CREATININE 8.99* 9.47* 6.80* 6.78* 3.89* 4.22*  ALBUMIN 2.3* 2.2* 2.4* 2.5* 2.5* 2.4*  CALCIUM 8.5* 8.7* 8.7* 8.4* 8.0* 8.2*  PHOS  --  6.6*  --   --   --   --   AST 52* 37 27 21 25 24   ALT 34 29 24 22 22 23    Liver Function Tests: Recent Labs  Lab 04/14/20 0145 04/15/20 0624 04/16/20 0214  AST 21 25 24   ALT 22 22 23   ALKPHOS 70 60 57  BILITOT 0.6 0.9 0.6  PROT 5.7* 5.4* 5.1*  ALBUMIN 2.5* 2.5* 2.4*   No results for input(s): LIPASE, AMYLASE in the last 168 hours. No results for input(s): AMMONIA in the last 168 hours. CBC: Recent Labs  Lab 04/12/20 0416 04/13/20 0839 04/14/20 0145 04/15/20 0624 04/16/20 0214  WBC 10.0 10.3 9.7 10.4 7.7  NEUTROABS 8.6* 9.2* 8.7* 8.9* 6.1  HGB 7.7* 9.6* 10.7* 10.5* 10.6*  HCT 25.0* 30.0* 32.5* 30.3* 31.9*  MCV 91.2 87.0 88.6 84.4 85.1  PLT 306 266 275 256 204   Cardiac Enzymes: No results for input(s): CKTOTAL, CKMB, CKMBINDEX, TROPONINI in the  last 168 hours. CBG: Recent Labs  Lab 04/16/20 1200 04/16/20 1755 04/16/20 2030 04/17/20 0744 04/17/20 1137  GLUCAP 233* 221* 217* 208* 226*    Iron Studies:  Recent Labs    04/16/20 1827  IRON 58  TIBC 190*  FERRITIN 594*   Studies/Results: No results found. . sodium chloride   Intravenous Once  . amLODipine  5 mg Oral Daily  . apixaban  10 mg Oral BID   Followed by  . [START ON 04/20/2020] apixaban  5 mg Oral BID  . vitamin C  500 mg Oral Daily  . Chlorhexidine Gluconate Cloth  6 each Topical Q0600  . Chlorhexidine Gluconate Cloth  6 each Topical Q0600  . darbepoetin (ARANESP) injection - DIALYSIS  60 mcg Intravenous Q Fri-HD  . hydrALAZINE  50 mg Oral TID  . insulin aspart  0-6 Units Subcutaneous TID WC  . insulin detemir  10 Units Subcutaneous Daily  . Ipratropium-Albuterol  1 puff Inhalation Q6H  . metoprolol tartrate  25 mg Oral BID  . nystatin   Topical BID  . pantoprazole  20  mg Oral Daily  . pravastatin  40 mg Oral QPM  . rOPINIRole  0.25 mg Oral QPM  . sodium bicarbonate  650 mg Oral BID    BMET    Component Value Date/Time   NA 141 04/16/2020 0214   K 3.6 04/16/2020 0214   CL 104 04/16/2020 0214   CO2 25 04/16/2020 0214   GLUCOSE 192 (H) 04/16/2020 0214   BUN 53 (H) 04/16/2020 0214   CREATININE 4.22 (H) 04/16/2020 0214   CREATININE 1.53 (H) 09/28/2011 0945   CALCIUM 8.2 (L) 04/16/2020 0214   CALCIUM 9.6 11/01/2009 1543   GFRNONAA 11 (L) 04/16/2020 0214   GFRNONAA 36 (L) 09/28/2011 0945   GFRAA 21 (L) 02/27/2019 0548   GFRAA 42 (L) 09/28/2011 0945   CBC    Component Value Date/Time   WBC 7.7 04/16/2020 0214   RBC 4.17 04/16/2020 1827   RBC 3.75 (L) 04/16/2020 0214   HGB 10.6 (L) 04/16/2020 0214   HCT 31.9 (L) 04/16/2020 0214   HCT 31.2 (L) 02/21/2019 0018   PLT 204 04/16/2020 0214   MCV 85.1 04/16/2020 0214   MCH 28.3 04/16/2020 0214   MCHC 33.2 04/16/2020 0214   RDW 14.8 04/16/2020 0214   LYMPHSABS 1.0 04/16/2020 0214   MONOABS  0.5 04/16/2020 0214   EOSABS 0.0 04/16/2020 0214   BASOSABS 0.0 04/16/2020 0214      Assessment and plan: 1. AKI/CKD stage IV- (baseline Scr 2.7-3.3) rising bun/creat in setting of covid-19 infection. S/p first HD 04/12/20  - had second treatement 12/22 and third 12/24.   1. Had 1st HD session on 04/12/20 , second HD 12/22 -  third HD 12/24.  Given how poor her renal function is at baseline it would not surprise me if with this episode she is now ESRD.  More support is better than less at this point with hosp for COVID-  But , will follow for signs of renal recovery.  Has temp catheter that has been in for 6 days.  Will check labs tomorrow and plan the timing for her fourth treatment.  Next week will need Tunneled cath, CLIP for OP HD and to make plans for vascular access 2. covid-19 infection- per primary team.  3. HTN/Volume- does not appear to be volume overloaded per hosp note.  BP variable on moderate norvasc and hydralazine 4. CAD- stable 5. Right peroneal DVT- started on Eliquis per primary 6. Anemia of CKD stage IV- low Hgb at 7.7. Started ESA and follow H/H - no iv iron given covid. Transfuse prn. 7. Vascular access- RIJ temp HD catheter placed 04/11/20. Had been referred to [facility] for access placement as OP. Will likely need tunneled HD catheter next week if no significant improvement of UOP and Scr.   Louis Meckel  [employer] [phone]

## 2020-04-17 NOTE — Plan of Care (Signed)

## 2020-04-18 LAB — CBC
HCT: 28.8 % — ABNORMAL LOW (ref 36.0–46.0)
Hemoglobin: 9.4 g/dL — ABNORMAL LOW (ref 12.0–15.0)
MCH: 28.9 pg (ref 26.0–34.0)
MCHC: 32.6 g/dL (ref 30.0–36.0)
MCV: 88.6 fL (ref 80.0–100.0)
Platelets: 124 10*3/uL — ABNORMAL LOW (ref 150–400)
RBC: 3.25 MIL/uL — ABNORMAL LOW (ref 3.87–5.11)
RDW: 15.4 % (ref 11.5–15.5)
WBC: 5.4 10*3/uL (ref 4.0–10.5)
nRBC: 0 % (ref 0.0–0.2)

## 2020-04-18 LAB — GLUCOSE, CAPILLARY
Glucose-Capillary: 176 mg/dL — ABNORMAL HIGH (ref 70–99)
Glucose-Capillary: 200 mg/dL — ABNORMAL HIGH (ref 70–99)
Glucose-Capillary: 223 mg/dL — ABNORMAL HIGH (ref 70–99)
Glucose-Capillary: 174 mg/dL — ABNORMAL HIGH (ref 70–99)

## 2020-04-18 LAB — COMPREHENSIVE METABOLIC PANEL
ALT: 21 U/L (ref 0–44)
AST: 22 U/L (ref 15–41)
Albumin: 2.4 g/dL — ABNORMAL LOW (ref 3.5–5.0)
Alkaline Phosphatase: 60 U/L (ref 38–126)
Anion gap: 10 (ref 5–15)
BUN: 33 mg/dL — ABNORMAL HIGH (ref 8–23)
CO2: 27 mmol/L (ref 22–32)
Calcium: 8.1 mg/dL — ABNORMAL LOW (ref 8.9–10.3)
Chloride: 101 mmol/L (ref 98–111)
Creatinine, Ser: 3.71 mg/dL — ABNORMAL HIGH (ref 0.44–1.00)
GFR, Estimated: 13 mL/min — ABNORMAL LOW (ref >60)
Glucose, Bld: 212 mg/dL — ABNORMAL HIGH (ref 70–99)
Potassium: 3.3 mmol/L — ABNORMAL LOW (ref 3.5–5.1)
Sodium: 138 mmol/L (ref 135–145)
Total Bilirubin: 0.5 mg/dL (ref 0.3–1.2)
Total Protein: 4.6 g/dL — ABNORMAL LOW (ref 6.5–8.1)

## 2020-04-18 NOTE — Progress Notes (Signed)
Patient ID: Ann [name], female   DOB: [birth year], 69 y.o.   MRN: [id number]   S:  No acute events noted.  At least 550 of UOP recorded-     O:BP (!) 151/70 (BP Location: Right Arm)   Pulse 77   Temp 97.7 F (36.5 C) (Oral)   Resp 12   Wt 98.6 kg   SpO2 95%   BMI 39.76 kg/m   Intake/Output Summary (Last 24 hours) at 04/18/2020 1330 Last data filed at 04/18/2020 1018 Gross per 24 hour  Intake 720 ml  Output 550 ml  Net 170 ml   Intake/Output: I/O last 3 completed shifts: In: 720 [P.O.:720] Out: 550 [Urine:550]  Intake/Output this shift:  Total I/O In: 120 [P.O.:120] Out: -  Weight change:  Not examined-  Noted reviewed   Recent Labs  Lab 04/12/20 0416 04/13/20 0839 04/14/20 0145 04/15/20 0624 04/16/20 0214 04/18/20 0432  NA 142 142 140 139 141 138  K 5.0 4.4 4.4 3.9 3.6 3.3*  CL 110 104 105 103 104 101  CO2 15* 18* 19* 24 25 27   GLUCOSE 212* 274* 397* 175* 192* 212*  BUN 111* 80* 91* 49* 53* 33*  CREATININE 9.47* 6.80* 6.78* 3.89* 4.22* 3.71*  ALBUMIN 2.2* 2.4* 2.5* 2.5* 2.4* 2.4*  CALCIUM 8.7* 8.7* 8.4* 8.0* 8.2* 8.1*  PHOS 6.6*  --   --   --   --   --   AST 37 27 21 25 24 22   ALT 29 24 22 22 23 21    Liver Function Tests: Recent Labs  Lab 04/15/20 0624 04/16/20 0214 04/18/20 0432  AST 25 24 22   ALT 22 23 21   ALKPHOS 60 57 60  BILITOT 0.9 0.6 0.5  PROT 5.4* 5.1* 4.6*  ALBUMIN 2.5* 2.4* 2.4*   No results for input(s): LIPASE, AMYLASE in the last 168 hours. No results for input(s): AMMONIA in the last 168 hours. CBC: Recent Labs  Lab 04/13/20 0839 04/14/20 0145 04/15/20 0624 04/16/20 0214 04/18/20 0432  WBC 10.3 9.7 10.4 7.7 5.4  NEUTROABS 9.2* 8.7* 8.9* 6.1  --   HGB 9.6* 10.7* 10.5* 10.6* 9.4*  HCT 30.0* 32.5* 30.3* 31.9* 28.8*  MCV 87.0 88.6 84.4 85.1 88.6  PLT 266 275 256 204 124*   Cardiac Enzymes: No results for input(s): CKTOTAL, CKMB, CKMBINDEX, TROPONINI in the last 168 hours. CBG: Recent Labs  Lab 04/17/20 1137  04/17/20 1718 04/17/20 2125 04/18/20 0716 04/18/20 1156  GLUCAP 226* 287* 213* 176* 200*    Iron Studies:  Recent Labs    04/16/20 1827  IRON 58  TIBC 190*  FERRITIN 594*   Studies/Results: No results found. . sodium chloride   Intravenous Once  . amLODipine  5 mg Oral Daily  . apixaban  10 mg Oral BID   Followed by  . [START ON 04/20/2020] apixaban  5 mg Oral BID  . vitamin C  500 mg Oral Daily  . Chlorhexidine Gluconate Cloth  6 each Topical Q0600  . Chlorhexidine Gluconate Cloth  6 each Topical Q0600  . darbepoetin (ARANESP) injection - DIALYSIS  60 mcg Intravenous Q Fri-HD  . hydrALAZINE  50 mg Oral TID  . insulin aspart  0-6 Units Subcutaneous TID WC  . insulin detemir  10 Units Subcutaneous Daily  . Ipratropium-Albuterol  1 puff Inhalation Q6H  . loratadine  10 mg Oral Daily  . metoprolol tartrate  25 mg Oral BID  . nystatin   Topical BID  . pantoprazole  20 mg Oral Daily  . pravastatin  40 mg Oral QPM  . rOPINIRole  0.25 mg Oral QPM  . sodium bicarbonate  650 mg Oral BID    BMET    Component Value Date/Time   NA 138 04/18/2020 0432   K 3.3 (L) 04/18/2020 0432   CL 101 04/18/2020 0432   CO2 27 04/18/2020 0432   GLUCOSE 212 (H) 04/18/2020 0432   BUN 33 (H) 04/18/2020 0432   CREATININE 3.71 (H) 04/18/2020 0432   CREATININE 1.53 (H) 09/28/2011 0945   CALCIUM 8.1 (L) 04/18/2020 0432   CALCIUM 9.6 11/01/2009 1543   GFRNONAA 13 (L) 04/18/2020 0432   GFRNONAA 36 (L) 09/28/2011 0945   GFRAA 21 (L) 02/27/2019 0548   GFRAA 42 (L) 09/28/2011 0945   CBC    Component Value Date/Time   WBC 5.4 04/18/2020 0432   RBC 3.25 (L) 04/18/2020 0432   HGB 9.4 (L) 04/18/2020 0432   HCT 28.8 (L) 04/18/2020 0432   HCT 31.2 (L) 02/21/2019 0018   PLT 124 (L) 04/18/2020 0432   MCV 88.6 04/18/2020 0432   MCH 28.9 04/18/2020 0432   MCHC 32.6 04/18/2020 0432   RDW 15.4 04/18/2020 0432   LYMPHSABS 1.0 04/16/2020 0214   MONOABS 0.5 04/16/2020 0214   EOSABS 0.0 04/16/2020  0214   BASOSABS 0.0 04/16/2020 0214      Assessment and plan: 1. AKI/CKD stage IV- (baseline Scr 2.7-3.3) rising bun/creat in setting of covid-19 infection. S/p first HD 04/12/20  - had second treatement 12/22 and third 12/24.   1. Had 1st HD session on 04/12/20 , second HD 12/22 -  third HD 12/24.  Given how poor her renal function is at baseline it would not surprise me if with this episode she is now ESRD.  More support is better than less at this point with hosp for COVID-  But , will follow for signs of renal recovery.  Has temp catheter that has been in for 7 days.   labs today seem reasonable and no needs for HD support today - follow labs and UOP next few days- if fails conservative managment -   will need tunneled cath, CLIP for OP HD next week 2. covid-19 infection- per primary team.  3. HTN/Volume- does not appear to be volume overloaded per hosp note.  BP variable on moderate norvasc and hydralazine 4. CAD- stable 5. Right peroneal DVT- started on Eliquis per primary 6. Anemia of CKD stage IV- low Hgb at 7.7. Started ESA and follow H/H - no iv iron given covid. Transfuse prn. 7. Vascular access- RIJ temp HD catheter placed 04/11/20. Had been referred to [facility] for access placement as OP. Will likely need tunneled HD catheter next week if no significant improvement of UOP and Scr.   Louis Meckel  [employer] [phone]

## 2020-04-18 NOTE — Progress Notes (Signed)
PROGRESS NOTE                                                                             PROGRESS NOTE                                                                                                                                                                                                             Patient Demographics:    Ann [name], is a 69 y.o. female, DOB - 04-01-1951, MVH:[id number]  Outpatient Primary MD for the patient is [name], MD    LOS - 7  Admit date - 04/11/2020    Chief Complaint  Patient presents with  . Covid Positive       Brief Narrative    HPI: Ann [name] is a 69 y.o. female with history h/o stage IV CKD with associated anemia, CAD, T2DM, HTN, HLD, GERD, asthma, OSA on CPAP, chronic lumbar back pain who was recently admitted 12/11-12/17/21 to [facility] for COVID-19 related GI/resp symptoms treated with Remdesevir ( steroids not given as no hypoxia throughout the stay) presents to ED today with worsening symptoms. In the ED 12/11, she was felt to be dehydrated in addition to CKD baseline creatinine 2.7-3.3, creatinine was 5.3 on admission,did peak at 6.5,  plateaued around 6.2 range for 3 days prior to discharge. Diuretics and ACEiwere held on discharge and nephrology follow up arranged. She was deconditioned at discharge but declined SNF , went home with [facility].  Per daughter, patient has been eating poorly and sleeping most of the time since last hospital discharge.  She also appeared to be having labored breathing and unable to perform any of her ADLs.  Daughter asking if staff here can help her take a bath as she has been unable to get her to the bathroom.  Patient currently oriented to self/person only but not to place or time.  She is unable to provide any details regarding hospital visit but does report productive cough and shortness of breath.  ED findings today: Afebrile, SBP  1 20-160, RR 15-19, hypoxic on arrival  requiring NRB M transiently-transitioned to 3 L O2 now.  Lab work shows worsening renal function with BUN 86->103, creatinine 6.26->8.99, bicarb 14, potassium 4.6, albumin 2.6->2.3.  CRP 20.6, procalcitonin 2.68, lactate 1.7.  Ferritin 1320, LDH 281, triglyceride 134, D-dimer 2.6->6.0 WBC 10.3, hemoglobin 9.0, platelet 321.  Chest x-ray shows "new mild bilateral upper lobe airspace disease, suspicious for viral pneumonia."  She received 10 mg IV Decadron x1 and on IV fluids NS at 100 mL/h.  Per EDP, nephrology has been consulted and patient requested to be admitted for further evaluation and management.    Subjective:    Ann [name] today denies any complaints, no chest pain, no shortness of breath, no cough.     Assessment  & Plan :    Principal Problem:   Acute respiratory failure with hypoxia (HCC) Active Problems:   HYPERTENSION   CAD (coronary artery disease)   Obesity, Class III, BMI 40-49.9 (morbid obesity) ([place])   Acute encephalopathy   Chronic pain   2019 novel coronavirus disease (COVID-19)   GERD (gastroesophageal reflux disease)   AKI (acute kidney injury) ([place])  Acute Hypoxic Resp. Failure due to Acute Covid 19 Viral Pneumonitis during the ongoing 2020 Covid 19 Pandemic -  -She is unvaccinated -He was treated with steroids and Remdesivir during recent hospitalization, was started on steroids, no hypoxia, steroid has been stopped -Received Actemra on admission. -Was encouraged to use incentive spirometry and flutter valve. - she is currently on room air.  Acute DVT -Elevated d D-dimers, venous Doppler was obtained she is having right peroneal veins DVT, given her Covid infection, and hypercoagulable state, high risk for VTE, continue with full anticoagulation with Eliquis.  Progressive AKI on CKD stage IV:  -Management per renal, started on hemodialysis this hospital stay, likely will be ESRD and need dialysis long-term.   -Per renal if failed conservative management  will need tunneled cath, clip for outpatient hemodialysis next week  Acute metabolic encephalopathy: - Likely related to significant uremia (greater than 100) and COVID-19 infection.  Patient has been improving gradually, CT head with no acute findings.   -Has resolved after few hemodialysis sessions.  Diabetes mellitus type 2:  -CBG currently acceptable on Levemir and sliding scale   Anemia -The setting of chronic kidney disease, she has 1 unit PRBC 12/20 with good response.  CAD: Resume home medications, aspirin, beta-blockers and statins.  Hypertension:  -Resume home medications, creased home dose Norvasc to 5 mg and titrate as needed.  Diuretics/ACE inhibitors held in last admission  Chronic pain: Hold Neurontin/Cymbalta for now until mental status improves.  GERD: Resume PPI unless otherwise advised by nephrology.  Obesity class III BMI 40-49.9: Follow-up PCP, weight modifying recommendations upon discharge    SpO2: 95 % O2 Flow Rate (L/min): 0 L/min  Recent Labs  Lab 04/11/20 1712 04/11/20 1712 04/12/20 0416 04/13/20 0839 04/14/20 0145 04/15/20 0624 04/16/20 0214 04/18/20 0432  WBC 10.7*  --  10.0 10.3 9.7 10.4 7.7 5.4  PLT 286  --  306 266 275 256 204 124*  DDIMER  --   --  7.15* 5.31* 3.85*  --   --   --   AST  --    < > 37 27 21 25 24 22   ALT  --    < > 29 24 22 22 23 21   ALKPHOS  --    < > 70 67 70 60 57 60  BILITOT  --    < >  0.6 0.8 0.6 0.9 0.6 0.5  ALBUMIN  --    < > 2.2* 2.4* 2.5* 2.5* 2.4* 2.4*  LATICACIDVEN 1.1  --   --   --   --   --   --   --    < > = values in this interval not displayed.       ABG     Component Value Date/Time   TCO2 20 09/27/2008 1636       Condition - Extremely Guarded  Family Communication  : Discussed with daughter [name] by phone 32/44/0102, 12/22, left voicemail 12/23  updated 12/24,12/26.  Code Status :  Full  Consults  :  Renal, PCCM  Procedures  : A hemodialysis catheter placement by PCCM 12/19,  1 unit PRBC transfusion on 12/20  Disposition Plan  :    Status is: Inpatient  Remains inpatient appropriate because:Hemodynamically unstable   Dispo: The patient is from: Home              Anticipated d/c is to: SNF              Anticipated d/c date is: > 3 days              Patient currently is not medically stable to d/c.      DVT Prophylaxis  :  Heparin>>Eliquis  Lab Results  Component Value Date   PLT 124 (L) 04/18/2020    Diet :  Diet Order            Diet renal/carb modified with fluid restriction Diet-HS Snack? Nothing; Fluid restriction: 1200 mL Fluid; Room service appropriate? No; Fluid consistency: Thin  Diet effective now                  Inpatient Medications  Scheduled Meds: . sodium chloride   Intravenous Once  . amLODipine  5 mg Oral Daily  . apixaban  10 mg Oral BID   Followed by  . [START ON 04/20/2020] apixaban  5 mg Oral BID  . vitamin C  500 mg Oral Daily  . Chlorhexidine Gluconate Cloth  6 each Topical Q0600  . Chlorhexidine Gluconate Cloth  6 each Topical Q0600  . darbepoetin (ARANESP) injection - DIALYSIS  60 mcg Intravenous Q Fri-HD  . hydrALAZINE  50 mg Oral TID  . insulin aspart  0-6 Units Subcutaneous TID WC  . insulin detemir  10 Units Subcutaneous Daily  . Ipratropium-Albuterol  1 puff Inhalation Q6H  . loratadine  10 mg Oral Daily  . metoprolol tartrate  25 mg Oral BID  . nystatin   Topical BID  . pantoprazole  20 mg Oral Daily  . pravastatin  40 mg Oral QPM  . rOPINIRole  0.25 mg Oral QPM  . sodium bicarbonate  650 mg Oral BID   Continuous Infusions:  PRN Meds:.albuterol, bisacodyl, guaiFENesin-dextromethorphan, heparin, lip balm, ondansetron **OR** ondansetron (ZOFRAN) IV, sodium chloride flush  Antibiotics  :    Anti-infectives (From admission, onward)   Start     Dose/Rate Route Frequency Ordered Stop   04/11/20 1300  cefTRIAXone (ROCEPHIN) 1 g in sodium chloride 0.9 % 100 mL IVPB  Status:  Discontinued        1  g 200 mL/hr over 30 Minutes Intravenous Every 24 hours 04/11/20 1252 04/13/20 1418   04/11/20 1300  doxycycline (VIBRA-TABS) tablet 100 mg  Status:  Discontinued        100 mg Oral Every 12 hours 04/11/20 1252 04/13/20 1418  Phillips Climes M.D on 04/18/2020 at 2:35 PM  To page go to [email or web address]  [facility] -  Office  [phone]       Objective:   Vitals:   04/17/20 2350 04/18/20 0400 04/18/20 0718 04/18/20 1154  BP: (!) 156/76 (!) 148/76 (!) 162/86 (!) 151/70  Pulse: 77 71 79 77  Resp: 18 16 12 12   Temp: 97.9 F (36.6 C) 98 F (36.7 C) 98.1 F (36.7 C) 97.7 F (36.5 C)  TempSrc: Axillary Axillary Oral Oral  SpO2: 96% 100% 98% 95%  Weight:        Wt Readings from Last 3 Encounters:  04/16/20 98.6 kg  04/09/20 99 kg  02/25/19 115 kg     Intake/Output Summary (Last 24 hours) at 04/18/2020 1435 Last data filed at 04/18/2020 1018 Gross per 24 hour  Intake 720 ml  Output 550 ml  Net 170 ml     Physical Exam  Awake Alert, Oriented X 3, No new F.N deficits, Normal affect Symmetrical Chest wall movement, Good air movement bilaterally, CTAB RRR,No Gallops,Rubs or new Murmurs, No Parasternal Heave +ve B.Sounds, Abd Soft, No tenderness, No rebound - guarding or rigidity. No Cyanosis, Clubbing or edema, No new Rash or bruise          Data Review:    CBC Recent Labs  Lab 04/12/20 0416 04/13/20 0839 04/14/20 0145 04/15/20 0624 04/16/20 0214 04/18/20 0432  WBC 10.0 10.3 9.7 10.4 7.7 5.4  HGB 7.7* 9.6* 10.7* 10.5* 10.6* 9.4*  HCT 25.0* 30.0* 32.5* 30.3* 31.9* 28.8*  PLT 306 266 275 256 204 124*  MCV 91.2 87.0 88.6 84.4 85.1 88.6  MCH 28.1 27.8 29.2 29.2 28.3 28.9  MCHC 30.8 32.0 32.9 34.7 33.2 32.6  RDW 16.2* 15.5 15.8* 15.3 14.8 15.4  LYMPHSABS 0.6* 0.5* 0.4* 0.7 1.0  --   MONOABS 0.2 0.3 0.3 0.5 0.5  --   EOSABS 0.0 0.0 0.0 0.0 0.0  --   BASOSABS 0.0 0.0 0.0 0.0 0.0  --     Recent Labs  Lab 04/11/20 1712 04/12/20 0416  04/12/20 0416 04/13/20 0839 04/14/20 0145 04/15/20 0624 04/16/20 0214 04/18/20 0432  NA  --  142   < > 142 140 139 141 138  K  --  5.0   < > 4.4 4.4 3.9 3.6 3.3*  CL  --  110   < > 104 105 103 104 101  CO2  --  15*   < > 18* 19* 24 25 27   GLUCOSE  --  212*   < > 274* 397* 175* 192* 212*  BUN  --  111*   < > 80* 91* 49* 53* 33*  CREATININE  --  9.47*   < > 6.80* 6.78* 3.89* 4.22* 3.71*  CALCIUM  --  8.7*   < > 8.7* 8.4* 8.0* 8.2* 8.1*  AST  --  37   < > 27 21 25 24 22   ALT  --  29   < > 24 22 22 23 21   ALKPHOS  --  70   < > 67 70 60 57 60  BILITOT  --  0.6   < > 0.8 0.6 0.9 0.6 0.5  ALBUMIN  --  2.2*   < > 2.4* 2.5* 2.5* 2.4* 2.4*  MG  --  2.4  --   --   --   --   --   --   DDIMER  --  7.15*  --  5.31*  3.85*  --   --   --   LATICACIDVEN 1.1  --   --   --   --   --   --   --    < > = values in this interval not displayed.    ------------------------------------------------------------------------------------------------------------------ No results for input(s): CHOL, HDL, LDLCALC, TRIG, CHOLHDL, LDLDIRECT in the last 72 hours.  Lab Results  Component Value Date   HGBA1C 6.6 (H) 04/04/2020   ------------------------------------------------------------------------------------------------------------------ No results for input(s): TSH, T4TOTAL, T3FREE, THYROIDAB in the last 72 hours.  Invalid input(s): FREET3  Cardiac Enzymes No results for input(s): CKMB, TROPONINI, MYOGLOBIN in the last 168 hours.  Invalid input(s): CK ------------------------------------------------------------------------------------------------------------------ No results found for: BNP  Micro Results Recent Results (from the past 240 hour(s))  Blood Culture (routine x 2)     Status: None   Collection Time: 04/11/20 10:18 AM   Specimen: BLOOD  Result Value Ref Range Status   Specimen Description BLOOD RIGHT ANTECUBITAL  Final   Special Requests   Final    BOTTLES DRAWN AEROBIC ONLY Blood Culture  adequate volume   Culture   Final    NO GROWTH 5 DAYS Performed at [facility], [address] [place], [place] [address]    Report Status 04/16/2020 FINAL  Final  Blood Culture (routine x 2)     Status: None   Collection Time: 04/11/20 10:23 AM   Specimen: BLOOD  Result Value Ref Range Status   Specimen Description BLOOD LEFT ANTECUBITAL  Final   Special Requests   Final    BOTTLES DRAWN AEROBIC AND ANAEROBIC Blood Culture adequate volume   Culture   Final    NO GROWTH 5 DAYS Performed at [facility], [place] [address], [place], [place] [address]    Report Status 04/16/2020 FINAL  Final  MRSA PCR Screening     Status: None   Collection Time: 04/11/20 12:56 PM   Specimen: Nasal Mucosa; Nasopharyngeal  Result Value Ref Range Status   MRSA by PCR NEGATIVE NEGATIVE Final    Comment:        The GeneXpert MRSA Assay (FDA approved for NASAL specimens only), is one component of a comprehensive MRSA colonization surveillance program. It is not intended to diagnose MRSA infection nor to guide or monitor treatment for MRSA infections. Performed at [facility], [place] [address], [place], [place] [address]     Radiology Reports CT ABDOMEN PELVIS WO CONTRAST  Result Date: 04/03/2020 CLINICAL DATA:  Abdominal pain. EXAM: CT ABDOMEN AND PELVIS WITHOUT CONTRAST TECHNIQUE: Multidetector CT imaging of the abdomen and pelvis was performed following the standard protocol without IV contrast. COMPARISON:  02/20/2019 FINDINGS: Lower chest: Patchy ground-glass infiltrates in the right lower lobe could be due to developing pneumonia, atypical pneumonia or acute aspiration. No pleural effusions. The heart is normal in size. No pericardial effusion. Three-vessel coronary artery calcifications are noted. Small hiatal hernia. Hepatobiliary: No hepatic lesions are identified without contrast. No intrahepatic biliary dilatation. The gallbladder is surgically absent. No common bile duct  dilatation. Pancreas: Marked fatty atrophy of the pancreas. No mass, inflammation or ductal dilatation. Spleen: Normal size.  No focal lesions. Adrenals/Urinary Tract: The adrenal glands are unremarkable. Small, chronically atrophied kidneys consistent with chronic renal failure. No worrisome renal lesions or hydroureteronephrosis. The bladder is unremarkable. Stomach/Bowel: The stomach, duodenum, small bowel and colon are unremarkable. No acute inflammatory changes, mass lesions or obstructive findings. The terminal ileum is normal. Scattered colonic  diverticulosis but no findings for acute diverticulitis. Vascular/Lymphatic: Significant diffuse vascular calcifications. No aneurysm. No mesenteric or retroperitoneal mass or adenopathy. No free air or free fluid. No leaking oral contrast. Reproductive: The uterus is surgically absent. Both ovaries are still present and appear normal. Other: Moderate-sized periumbilical abdominal wall hernia containing fat. Musculoskeletal: No significant bony findings. IMPRESSION: 1. No acute abdominal/pelvic findings, mass lesions or adenopathy. 2. Patchy ground-glass infiltrates in the right lower lobe could be due to developing pneumonia, atypical pneumonia or acute aspiration. 3. Status post cholecystectomy. No biliary dilatation. 4. Small, chronically atrophied kidneys consistent with chronic renal failure. 5. Moderate-sized periumbilical abdominal wall hernia containing fat. 6. Aortic atherosclerosis. Aortic Atherosclerosis (ICD10-I70.0). Electronically Signed   By: Marijo Sanes M.D.   On: 04/03/2020 19:19   CT HEAD WO CONTRAST  Result Date: 04/14/2020 CLINICAL DATA:  Delirium.  Altered mental status. EXAM: CT HEAD WITHOUT CONTRAST TECHNIQUE: Contiguous axial images were obtained from the base of the skull through the vertex without intravenous contrast. COMPARISON:  April 03, 2020. FINDINGS: Brain: No evidence of acute large vascular territory infarction, hemorrhage,  hydrocephalus, extra-axial collection or mass lesion/mass effect. Similar cerebral atrophy with ex vacuo ventricular dilation. Mild white matter hypodensities, likely related to chronic microvascular ischemic disease. Vascular: Calcific atherosclerosis. Skull: No acute fracture. Sinuses/Orbits: Left maxillary sinus retention cyst versus polyp. Unremarkable orbits. Other: No mastoid effusions. IMPRESSION: 1. No evidence of acute intracranial abnormality. 2. Similar cerebral atrophy and ventriculomegaly. Electronically Signed   By: Margaretha Sheffield MD   On: 04/14/2020 16:08   CT Head Wo Contrast  Result Date: 04/03/2020 CLINICAL DATA:  Sudden onset of leg weakness today. EXAM: CT HEAD WITHOUT CONTRAST TECHNIQUE: Contiguous axial images were obtained from the base of the skull through the vertex without intravenous contrast. COMPARISON:  None. FINDINGS: Brain: Age advanced cerebral atrophy, ventriculomegaly and mild periventricular white matter disease. No extra-axial fluid collections are identified. No CT findings for acute hemispheric infarction or intracranial hemorrhage. No mass lesions. The brainstem and cerebellum are normal. Vascular: Moderate to advanced vascular calcifications but no aneurysm or hyperdense vessels. Skull: No skull fracture or bone lesions. Sinuses/Orbits: The paranasal sinuses and mastoid air cells are clear except for a 2 cm mucous retention cyst or polyp in the left maxillary sinus. The globes are intact. Other: No scalp lesions or scalp hematoma. IMPRESSION: 1. Age advanced cerebral atrophy, ventriculomegaly and mild periventricular white matter disease. 2. No acute intracranial findings or mass lesions. Electronically Signed   By: Marijo Sanes M.D.   On: 04/03/2020 19:14   US RENAL  Result Date: 04/04/2020 CLINICAL DATA:  Acute kidney injury EXAM: RENAL / URINARY TRACT ULTRASOUND COMPLETE COMPARISON:  CT 04/03/2020 FINDINGS: Technical note: Significantly limited examination  secondary to poor penetration related to patient body habitus and shadowing from overlying bowel gas. Right Kidney: Limited. Echogenic renal parenchyma with cortical thinning compatible with chronic medical renal disease. Right renal length of approximately 10.6 cm. No shadowing stone or hydronephrosis is identified. Left Kidney: Very limited evaluation. Visualized left kidney appears diffusely echogenic with renal cortical thinning compatible with chronic medical renal disease. No obvious hydronephrosis. Bladder: Appears normal for degree of bladder distention. Other: None. IMPRESSION: 1. Limited exam. 2. No sonographic evidence of obstructive uropathy. 3. Atrophic, echogenic kidneys compatible with chronic medical renal disease. Electronically Signed   By: Davina Poke D.O.   On: 04/04/2020 12:18   DG CHEST PORT 1 VIEW  Result Date: 04/11/2020 CLINICAL DATA:  Hemodialysis catheter  placement EXAM: PORTABLE CHEST 1 VIEW COMPARISON:  April 11, 2020 FINDINGS: The heart size is enlarged. Bilateral airspace disease is again noted and has progressed in the right mid and right upper lung zones. There is vascular congestion with small bilateral pleural effusions. There is a right-sided central venous catheter with tip projecting over the mid SVC. The catheter fall short of the cavoatrial junction by approximately 6-7 cm. There is no evidence for pneumothorax. Aortic calcifications are noted. The main pulmonary artery is dilated. IMPRESSION: 1. Interval worsening of bilateral airspace disease, greatest in the right mid and right upper lung zones. 2. Hemodialysis catheter as above. While the tip likely terminates in the SVC, a catheter fall short of the cavoatrial junction by at least 6 cm. 3. Persistent cardiomegaly with vascular congestion and small bilateral pleural effusions. Electronically Signed   By: Constance Holster M.D.   On: 04/11/2020 17:23   DG Chest Port 1 View  Result Date:  04/11/2020 CLINICAL DATA:  Shortness of breath. Cough. Hypoxia. COVID-19 virus infection. EXAM: PORTABLE CHEST 1 VIEW COMPARISON:  04/03/2020 FINDINGS: Heart size remains normal. New mild airspace opacity is seen in the central right upper lobe and peripheral left upper lobe. No evidence of pleural effusion. IMPRESSION: New mild bilateral upper lobe airspace disease, suspicious for viral pneumonia. Electronically Signed   By: Marlaine Hind M.D.   On: 04/11/2020 11:30   DG Chest Port 1 View  Result Date: 04/03/2020 CLINICAL DATA:  Right lower lobe infiltrate EXAM: PORTABLE CHEST 1 VIEW COMPARISON:  Same day CT abdomen pelvis, 04/03/2020, chest radiographs, 04/05/2014 FINDINGS: The heart size and mediastinal contours are within normal limits. Subtle heterogeneous airspace opacity of the right lung base. The visualized skeletal structures are unremarkable. IMPRESSION: Subtle heterogeneous airspace opacity of the right lung base, in keeping with findings of prior CT and consistent with infection or aspiration. Electronically Signed   By: Eddie Candle M.D.   On: 04/03/2020 20:23   ECHOCARDIOGRAM COMPLETE  Result Date: 04/04/2020    ECHOCARDIOGRAM REPORT   Patient Name:   ZYANYA GLAZA Snoke Date of Exam: 04/04/2020 Medical Rec #:  [id number]     Height:       62.0 in Accession #:    [id number]    Weight:       211.2 lb Date of Birth:  1950-05-31     BSA:          1.956 m Patient Age:    26 years      BP:           146/78 mmHg Patient Gender: F             HR:           82 bpm. Exam Location:  Inpatient Procedure: 2D Echo, Cardiac Doppler, Color Doppler and Intracardiac            Opacification Agent Indications:    Abnormal ECG 794.31 / R94.31  History:        Patient has prior history of Echocardiogram examinations, most                 recent 11/11/1996. CAD, Signs/Symptoms:Shortness of Breath and                 Chest Pain; Risk Factors:Hypertension, Diabetes and                 Dyslipidemia.  Sonographer:    Bernadene Person RDCS Referring Phys: [id number] [place]  ORTIZ IMPRESSIONS  1. Left ventricular ejection fraction, by estimation, is 60 to 65%. The left ventricle has normal function. The left ventricle has no regional wall motion abnormalities. Left ventricular diastolic parameters are consistent with Grade II diastolic dysfunction (pseudonormalization). Elevated left ventricular end-diastolic pressure.  2. Right ventricular systolic function is normal. The right ventricular size is normal. There is normal pulmonary artery systolic pressure.  3. The mitral valve is degenerative. Trivial mitral valve regurgitation. No evidence of mitral stenosis. Moderate mitral annular calcification.  4. The aortic valve is tricuspid. Aortic valve regurgitation is not visualized. Mild to moderate aortic valve sclerosis/calcification is present, without any evidence of aortic stenosis.  5. The inferior vena cava is normal in size with greater than 50% respiratory variability, suggesting right atrial pressure of 3 mmHg. FINDINGS  Left Ventricle: Left ventricular ejection fraction, by estimation, is 60 to 65%. The left ventricle has normal function. The left ventricle has no regional wall motion abnormalities. Definity contrast agent was given IV to delineate the left ventricular  endocardial borders. The left ventricular internal cavity size was normal in size. There is no left ventricular hypertrophy. Left ventricular diastolic parameters are consistent with Grade II diastolic dysfunction (pseudonormalization). Elevated left ventricular end-diastolic pressure. Right Ventricle: The right ventricular size is normal. No increase in right ventricular wall thickness. Right ventricular systolic function is normal. There is normal pulmonary artery systolic pressure. The tricuspid regurgitant velocity is 2.75 m/s, and  with an assumed right atrial pressure of 3 mmHg, the estimated right ventricular systolic pressure is 62.9 mmHg. Left Atrium:  Left atrial size was normal in size. Right Atrium: Right atrial size was normal in size. Pericardium: There is no evidence of pericardial effusion. Mitral Valve: The mitral valve is degenerative in appearance. There is moderate thickening of the mitral valve leaflet(s). There is moderate calcification of the mitral valve leaflet(s). Moderate mitral annular calcification. Trivial mitral valve regurgitation. No evidence of mitral valve stenosis. Tricuspid Valve: The tricuspid valve is normal in structure. Tricuspid valve regurgitation is mild . No evidence of tricuspid stenosis. Aortic Valve: The aortic valve is tricuspid. Aortic valve regurgitation is not visualized. Mild to moderate aortic valve sclerosis/calcification is present, without any evidence of aortic stenosis. Pulmonic Valve: The pulmonic valve was normal in structure. Pulmonic valve regurgitation is not visualized. No evidence of pulmonic stenosis. Aorta: The aortic root is normal in size and structure. Venous: The inferior vena cava is normal in size with greater than 50% respiratory variability, suggesting right atrial pressure of 3 mmHg. IAS/Shunts: No atrial level shunt detected by color flow Doppler.  LEFT VENTRICLE PLAX 2D LVIDd:         5.80 cm  Diastology LVIDs:         4.10 cm  LV e' medial:    3.81 cm/s LV PW:         0.70 cm  LV E/e' medial:  24.3 LV IVS:        1.00 cm  LV e' lateral:   4.57 cm/s LVOT diam:     2.00 cm  LV E/e' lateral: 20.2 LV SV:         78 LV SV Index:   40 LVOT Area:     3.14 cm  RIGHT VENTRICLE RV S prime:     13.10 cm/s TAPSE (M-mode): 2.0 cm LEFT ATRIUM             Index       RIGHT ATRIUM  Index LA diam:        3.40 cm 1.74 cm/m  RA Area:     18.10 cm LA Vol (A2C):   56.0 ml 28.62 ml/m RA Volume:   47.40 ml  24.23 ml/m LA Vol (A4C):   51.1 ml 26.12 ml/m LA Biplane Vol: 53.4 ml 27.29 ml/m  AORTIC VALVE LVOT Vmax:   104.00 cm/s LVOT Vmean:  86.300 cm/s LVOT VTI:    0.249 m  AORTA Ao Root diam: 3.30 cm Ao  Asc diam:  3.60 cm MITRAL VALVE                TRICUSPID VALVE MV Area (PHT): 2.48 cm     TR Peak grad:   30.2 mmHg MV Decel Time: 306 msec     TR Vmax:        275.00 cm/s MV E velocity: 92.40 cm/s MV A velocity: 138.00 cm/s  SHUNTS MV E/A ratio:  0.67         Systemic VTI:  0.25 m                             Systemic Diam: 2.00 cm Jenkins Rouge MD Electronically signed by Jenkins Rouge MD Signature Date/Time: 04/04/2020/11:23:04 AM    Final    VAS [place] LOWER EXTREMITY VENOUS (DVT)  Result Date: 04/13/2020  Lower Venous DVT Study Indications: Covid +, elevated D dimer.  Limitations: Body habitus, cooperation and poor ultrasound/tissue interface. Performing Technologist: Antonieta Pert RDMS, RVT  Examination Guidelines: A complete evaluation includes B-mode imaging, spectral Doppler, color Doppler, and power Doppler as needed of all accessible portions of each vessel. Bilateral testing is considered an integral part of a complete examination. Limited examinations for reoccurring indications may be performed as noted. The reflux portion of the exam is performed with the patient in reverse Trendelenburg.  +---------+---------------+---------+-----------+----------+--------------+ RIGHT    CompressibilityPhasicitySpontaneityPropertiesThrombus Aging +---------+---------------+---------+-----------+----------+--------------+ CFV      Full           Yes      Yes                                 +---------+---------------+---------+-----------+----------+--------------+ SFJ      Full                                                        +---------+---------------+---------+-----------+----------+--------------+ FV Prox  Full                                                        +---------+---------------+---------+-----------+----------+--------------+ FV Mid   Full                                                         +---------+---------------+---------+-----------+----------+--------------+ FV DistalFull                                                        +---------+---------------+---------+-----------+----------+--------------+  PFV      Full                                                        +---------+---------------+---------+-----------+----------+--------------+ POP      Full           Yes      Yes                                 +---------+---------------+---------+-----------+----------+--------------+ PTV      Full                                                        +---------+---------------+---------+-----------+----------+--------------+ PERO     Partial                            dilated   Acute          +---------+---------------+---------+-----------+----------+--------------+ GSV      Full                                                        +---------+---------------+---------+-----------+----------+--------------+   +---------+---------------+---------+-----------+----------+--------------+ LEFT     CompressibilityPhasicitySpontaneityPropertiesThrombus Aging +---------+---------------+---------+-----------+----------+--------------+ CFV      Full           Yes      Yes                                 +---------+---------------+---------+-----------+----------+--------------+ SFJ      Full                                                        +---------+---------------+---------+-----------+----------+--------------+ FV Prox  Full                                                        +---------+---------------+---------+-----------+----------+--------------+ FV Mid   Full                                                        +---------+---------------+---------+-----------+----------+--------------+ FV DistalFull                                                         +---------+---------------+---------+-----------+----------+--------------+  PFV      Full                                                        +---------+---------------+---------+-----------+----------+--------------+ POP      Full           Yes      Yes                                 +---------+---------------+---------+-----------+----------+--------------+ PTV      Full                                                        +---------+---------------+---------+-----------+----------+--------------+ PERO     Full                                                        +---------+---------------+---------+-----------+----------+--------------+ GSV      Full                                                        +---------+---------------+---------+-----------+----------+--------------+     Summary: RIGHT: - Findings consistent with acute deep vein thrombosis involving the right peroneal veins. - No cystic structure found in the popliteal fossa. - Small segment of thrombus in the right calf.  LEFT: - There is no evidence of deep vein thrombosis in the lower extremity. However, portions of this examination were limited- see technologist comments above.  - No cystic structure found in the popliteal fossa. - Difficult exam bilaterally due to large body habitus, poor penetration and poor patient cooperation.  *See table(s) above for measurements and observations. Electronically signed by Harold Barban MD on 04/13/2020 at 6:07:50 PM.    Final

## 2020-04-19 LAB — CBC
HCT: 27.7 % — ABNORMAL LOW (ref 36.0–46.0)
Hemoglobin: 8.7 g/dL — ABNORMAL LOW (ref 12.0–15.0)
MCH: 28.2 pg (ref 26.0–34.0)
MCHC: 31.4 g/dL (ref 30.0–36.0)
MCV: 89.9 fL (ref 80.0–100.0)
Platelets: 115 10*3/uL — ABNORMAL LOW (ref 150–400)
RBC: 3.08 MIL/uL — ABNORMAL LOW (ref 3.87–5.11)
RDW: 15.8 % — ABNORMAL HIGH (ref 11.5–15.5)
WBC: 4 10*3/uL (ref 4.0–10.5)
nRBC: 0 % (ref 0.0–0.2)

## 2020-04-19 LAB — RENAL FUNCTION PANEL
Albumin: 2.2 g/dL — ABNORMAL LOW (ref 3.5–5.0)
Anion gap: 11 (ref 5–15)
BUN: 35 mg/dL — ABNORMAL HIGH (ref 8–23)
CO2: 26 mmol/L (ref 22–32)
Calcium: 8.1 mg/dL — ABNORMAL LOW (ref 8.9–10.3)
Chloride: 102 mmol/L (ref 98–111)
Creatinine, Ser: 4.42 mg/dL — ABNORMAL HIGH (ref 0.44–1.00)
GFR, Estimated: 10 mL/min — ABNORMAL LOW (ref >60)
Glucose, Bld: 194 mg/dL — ABNORMAL HIGH (ref 70–99)
Phosphorus: 3.5 mg/dL (ref 2.5–4.6)
Potassium: 3.2 mmol/L — ABNORMAL LOW (ref 3.5–5.1)
Sodium: 139 mmol/L (ref 135–145)

## 2020-04-19 LAB — GLUCOSE, CAPILLARY
Glucose-Capillary: 202 mg/dL — ABNORMAL HIGH (ref 70–99)
Glucose-Capillary: 252 mg/dL — ABNORMAL HIGH (ref 70–99)
Glucose-Capillary: 258 mg/dL — ABNORMAL HIGH (ref 70–99)
Glucose-Capillary: 120 mg/dL — ABNORMAL HIGH (ref 70–99)

## 2020-04-19 MED ORDER — CHLORHEXIDINE GLUCONATE CLOTH 2 % EX PADS
6.0000 | MEDICATED_PAD | Freq: Every day | CUTANEOUS | Status: DC
  Administered 2020-04-20 – 2020-04-21 (×2): 6 via TOPICAL

## 2020-04-19 MED ORDER — INSULIN ASPART 100 UNIT/ML ~~LOC~~ SOLN
0.0000 [IU] | Freq: Three times a day (TID) | SUBCUTANEOUS | Status: DC
  Administered 2020-04-19: 8 [IU] via SUBCUTANEOUS
  Administered 2020-04-20: 3 [IU] via SUBCUTANEOUS

## 2020-04-19 MED ORDER — INSULIN ASPART 100 UNIT/ML ~~LOC~~ SOLN: 0.0000 [IU] | Freq: Every day | SUBCUTANEOUS | Status: DC

## 2020-04-19 MED ORDER — APIXABAN 5 MG PO TABS: 5.0000 mg | ORAL_TABLET | Freq: Two times a day (BID) | ORAL | 0 refills | Status: DC

## 2020-04-19 NOTE — NC FL2 (Signed)
[place] LEVEL OF CARE SCREENING TOOL     IDENTIFICATION  Patient Name: RIANNON MUKHERJEE Birthdate: November 26, 1950 Sex: female Admission Date (Current Location): 04/11/2020  [facility] and [place] Number:  Herbalist and Address:  [facility] [place] [address] [place], [place] [address]      Provider Number: [id number]  Attending Physician Name and Address:  Albertine Patricia, MD  Relative Name and Phone Number:  Brouse,Felicia (Daughter)   [phone]    Current Level of Care: Hospital Recommended Level of Care: [place] Prior Approval Number:    Date Approved/Denied:   PASRR Number: [id number]  Discharge Plan: SNF    Current Diagnoses: Patient Active Problem List   Diagnosis Date Noted  . Acute respiratory failure with hypoxia ([place]) 04/11/2020  . 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 AKI (acute kidney injury) ([place]) 04/04/2020  . Acute renal failure ([place]) 04/04/2020  . 2019 novel coronavirus disease (COVID-19) 04/03/2020  . GERD (gastroesophageal reflux disease)   . Constipation 02/25/2019  . Pyelonephritis 02/20/2019  . Acute encephalopathy 02/20/2019  . Chronic pain 02/20/2019  . Obesity, Class III, BMI 40-49.9 (morbid obesity) ([place]) 01/28/2013  . Uncontrolled type II diabetes mellitus with hyperglycemia ([place]) 03/25/2012  . Health care maintenance 03/25/2012  . Acute blood loss anemia 12/01/2011  . 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  . Plantar fasciitis of right foot 09/28/2011  . Breast pain 03/08/2011  . 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 Knee pain, right 01/20/2011  . Thoracic spine pain 12/13/2010  . Hyperlipidemia 10/21/2009  . CKD (chronic kidney disease), stage IV ([place]) 10/20/2009  . UNSPECIFIED ANEMIA 09/29/2008  . HYPERTENSION 09/29/2008  . CAD (coronary artery disease) 09/29/2008  . ASTHMA, UNSPECIFIED, UNSPECIFIED STATUS 09/29/2008    Orientation RESPIRATION BLADDER Height &  Weight     Self,Place  Normal Incontinent,External catheter Weight: 217 lb 6 oz (98.6 kg) Height:     BEHAVIORAL SYMPTOMS/MOOD NEUROLOGICAL BOWEL NUTRITION STATUS      Continent Diet (Please see DC Summary)  AMBULATORY STATUS COMMUNICATION OF NEEDS Skin   Limited Assist Verbally Normal                       Personal Care Assistance Level of Assistance  Bathing,Feeding,Dressing Bathing Assistance: Limited assistance Feeding assistance: Limited assistance Dressing Assistance: Limited assistance     Functional Limitations Info  Sight Sight Info: Impaired        SPECIAL CARE FACTORS FREQUENCY  PT (By licensed PT),OT (By licensed OT)     PT Frequency: 5x/week OT Frequency: 5x/week            Contractures Contractures Info: Not present    Additional Factors Info  Code Status,Allergies,Isolation Precautions,Insulin Sliding Scale Code Status Info: Full Allergies Info: Codeine, Metronidazole   Insulin Sliding Scale Info: See dc summary Isolation Precautions Info: COVID+ 04/03/20     Current Medications (04/19/2020):  This is the current hospital active medication list Current Facility-Administered Medications  Medication Dose Route Frequency Provider Last Rate Last Admin  . 0.9 %  sodium chloride infusion (Manually program via Guardrails IV Fluids)   Intravenous Once Elgergawy, Silver Huguenin, MD      . albuterol (VENTOLIN HFA) 108 (90 Base) MCG/ACT inhaler 1-2 puff  1-2 puff Inhalation Q4H PRN Guilford Shi, MD   2 puff at 04/12/20 2005  . amLODipine (NORVASC) tablet 5 mg  5 mg Oral Daily Elgergawy, Silver Huguenin, MD  5 mg at 04/19/20 0848  . apixaban (ELIQUIS) tablet 10 mg  10 mg Oral BID Elgergawy, Silver Huguenin, MD   10 mg at 04/19/20 0847   Followed by  . [START ON 04/20/2020] apixaban (ELIQUIS) tablet 5 mg  5 mg Oral BID Elgergawy, Silver Huguenin, MD      . ascorbic acid (VITAMIN C) tablet 500 mg  500 mg Oral Daily Guilford Shi, MD   500 mg at 04/19/20 0848  . bisacodyl  (DULCOLAX) suppository 10 mg  10 mg Rectal Daily PRN Guilford Shi, MD      . Chlorhexidine Gluconate Cloth 2 % PADS 6 each  6 each Topical Q0600 Roney Jaffe, MD   6 each at 04/19/20 [phone]  . Chlorhexidine Gluconate Cloth 2 % PADS 6 each  6 each Topical Q0600 Corliss Parish, MD   6 each at 04/19/20 [phone]  . Darbepoetin Alfa (ARANESP) injection 60 mcg  60 mcg Intravenous Q Fri-HD Corliss Parish, MD   60 mcg at 04/16/20 1646  . guaiFENesin-dextromethorphan (ROBITUSSIN DM) 100-10 MG/5ML syrup 10 mL  10 mL Oral Q4H PRN Guilford Shi, MD      . heparin injection 2,000 Units  20 Units/kg Dialysis PRN Donato Heinz, MD      . hydrALAZINE (APRESOLINE) tablet 50 mg  50 mg Oral TID Guilford Shi, MD   50 mg at 04/19/20 0848  . insulin aspart (novoLOG) injection 0-6 Units  0-6 Units Subcutaneous TID WC Guilford Shi, MD   2 Units at 04/19/20 0849  . insulin detemir (LEVEMIR) injection 10 Units  10 Units Subcutaneous Daily Elgergawy, Silver Huguenin, MD   10 Units at 04/19/20 0850  . Ipratropium-Albuterol (COMBIVENT) respimat 1 puff  1 puff Inhalation Q6H Guilford Shi, MD   1 puff at 04/19/20 0850  . lip balm (CARMEX) ointment   Topical PRN Elgergawy, Silver Huguenin, MD   1 application at 00/86/76 2143  . loratadine (CLARITIN) tablet 10 mg  10 mg Oral Daily Elgergawy, Silver Huguenin, MD   10 mg at 04/19/20 0849  . metoprolol tartrate (LOPRESSOR) tablet 25 mg  25 mg Oral BID Guilford Shi, MD   25 mg at 04/19/20 0847  . nystatin (MYCOSTATIN/NYSTOP) topical powder   Topical BID Guilford Shi, MD   Given at 04/19/20 0850  . ondansetron (ZOFRAN) tablet 4 mg  4 mg Oral Q6H PRN Guilford Shi, MD       Or  . ondansetron (ZOFRAN) injection 4 mg  4 mg Intravenous Q6H PRN Kamineni, Neelima, MD      . pantoprazole (PROTONIX) EC tablet 20 mg  20 mg Oral Daily Guilford Shi, MD   20 mg at 04/19/20 0848  . pravastatin (PRAVACHOL) tablet 40 mg  40 mg Oral QPM Guilford Shi, MD   40 mg  at 04/18/20 1741  . rOPINIRole (REQUIP) tablet 0.25 mg  0.25 mg Oral QPM Guilford Shi, MD   0.25 mg at 04/18/20 1741  . sodium bicarbonate tablet 650 mg  650 mg Oral BID Guilford Shi, MD   650 mg at 04/19/20 0849  . sodium chloride flush (NS) 0.9 % injection 10-40 mL  10-40 mL Intracatheter PRN Elgergawy, Silver Huguenin, MD         Discharge Medications: Please see discharge summary for a list of discharge medications.  Relevant Imaging Results:  Relevant Lab Results:   Additional Information SSN: [id number]. New dialysis patient, will require transportation to OP dialysis center (location to be determined by snf).  Benard Halsted,  LCSW

## 2020-04-19 NOTE — Progress Notes (Signed)
PROGRESS NOTE                                                                             PROGRESS NOTE                                                                                                                                                                                                             Patient Demographics:    Ann [name], is a 69 y.o. female, DOB - [birth year], AYT:[id number]  Outpatient Primary MD for the patient is [name], MD    LOS - 8  Admit date - 04/11/2020    Chief Complaint  Patient presents with  . Covid Positive       Brief Narrative    HPI: Ann [name] is a 69 y.o. female with history h/o stage IV CKD with associated anemia, CAD, T2DM, HTN, HLD, GERD, asthma, OSA on CPAP, chronic lumbar back pain who was recently admitted 12/11-12/17/21 to [facility] for COVID-19 related GI/resp symptoms treated with Remdesevir ( steroids not given as no hypoxia throughout the stay) presents to ED today with worsening symptoms. In the ED 12/11, she was felt to be dehydrated in addition to CKD baseline creatinine 2.7-3.3, creatinine was 5.3 on admission,did peak at 6.5,  plateaued around 6.2 range for 3 days prior to discharge. Diuretics and ACEiwere held on discharge and nephrology follow up arranged. She was deconditioned at discharge but declined SNF , went home with [facility].  Per daughter, patient has been eating poorly and sleeping most of the time since last hospital discharge.  She also appeared to be having labored breathing and unable to perform any of her ADLs.  Daughter asking if staff here can help her take a bath as she has been unable to get her to the bathroom.  Patient currently oriented to self/person only but not to place or time.  She is unable to provide any details regarding hospital visit but does report productive cough and shortness of breath.  ED findings today: Afebrile, SBP  1 20-160, RR 15-19, hypoxic on arrival  requiring NRB M transiently-transitioned to 3 L O2 now.  Lab work shows worsening renal function with BUN 86->103, creatinine 6.26->8.99, bicarb 14, potassium 4.6, albumin 2.6->2.3.  CRP 20.6, procalcitonin 2.68, lactate 1.7.  Ferritin 1320, LDH 281, triglyceride 134, D-dimer 2.6->6.0 WBC 10.3, hemoglobin 9.0, platelet 321.  Chest x-ray shows "new mild bilateral upper lobe airspace disease, suspicious for viral pneumonia."  She received 10 mg IV Decadron x1 and on IV fluids NS at 100 mL/h.  Per EDP, nephrology has been consulted and patient requested to be admitted for further evaluation and management.    Subjective:    Helma Tindol today denies any complaints, no chest pain, no shortness of breath, no cough.     Assessment  & Plan :    Principal Problem:   Acute respiratory failure with hypoxia (HCC) Active Problems:   HYPERTENSION   CAD (coronary artery disease)   Obesity, Class III, BMI 40-49.9 (morbid obesity) ([place])   Acute encephalopathy   Chronic pain   2019 novel coronavirus disease (COVID-19)   GERD (gastroesophageal reflux disease)   AKI (acute kidney injury) ([place])  Acute Hypoxic Resp. Failure due to Acute Covid 19 Viral Pneumonitis during the ongoing 2020 Covid 19 Pandemic -  -She is unvaccinated -He was treated with steroids and Remdesivir during recent hospitalization, was started on steroids, no hypoxia, steroid has been stopped -Received Actemra on admission. -Was encouraged to use incentive spirometry and flutter valve. - she is currently on room air.  Acute DVT -Elevated d D-dimers, venous Doppler was obtained she is having right peroneal veins DVT, given her Covid infection, and hypercoagulable state, high risk for VTE, continue with full anticoagulation with Eliquis.  Progressive AKI on CKD stage IV:  -Management per renal, started on hemodialysis this hospital stay, likely will be ESRD and need dialysis long-term.   -Per renal if failed conservative management  will need tunneled cath, clip for outpatient hemodialysis next week  Acute metabolic encephalopathy: - Likely related to significant uremia (greater than 100) and COVID-19 infection.  Patient has been improving gradually, CT head with no acute findings.   -Has resolved after few hemodialysis sessions. -Patient back at baseline, she is awake alert x3, appropriate and coherent.  Diabetes mellitus type 2:  -CBG currently acceptable on Levemir and sliding scale   Anemia -The setting of chronic kidney disease, she has 1 unit PRBC 12/20 with good response.  CAD: Resume home medications, aspirin, beta-blockers and statins.  Hypertension:  -Resume home medications, creased home dose Norvasc to 5 mg and titrate as needed.  Diuretics/ACE inhibitors held in last admission  Chronic pain: Hold Neurontin/Cymbalta for now until mental status improves.  GERD: Resume PPI unless otherwise advised by nephrology.  Obesity class III BMI 40-49.9: Follow-up PCP, weight modifying recommendations upon discharge    SpO2: 96 % O2 Flow Rate (L/min): 0 L/min  Recent Labs  Lab 04/13/20 0839 04/14/20 0145 04/15/20 0624 04/16/20 0214 04/18/20 0432 04/19/20 0259  WBC 10.3 9.7 10.4 7.7 5.4 4.0  PLT 266 275 256 204 124* 115*  DDIMER 5.31* 3.85*  --   --   --   --   AST 27 21 25 24 22   --   ALT 24 22 22 23 21   --   ALKPHOS 67 70 60 57 60  --   BILITOT 0.8 0.6 0.9 0.6 0.5  --   ALBUMIN 2.4* 2.5* 2.5* 2.4* 2.4* 2.2*  ABG     Component Value Date/Time   TCO2 20 09/27/2008 1636       Condition - Extremely Guarded  Family Communication  : Discussed with daughter [name] by phone 62/69/4854, 12/22, left voicemail 12/23  updated 12/24,12/26.    Code Status :  Full  Consults  :  Renal, PCCM  Procedures  : A hemodialysis catheter placement by PCCM 12/19, 1 unit PRBC transfusion on 12/20  Disposition Plan  :    Status is: Inpatient  Remains inpatient appropriate  because:Hemodynamically unstable   Dispo: The patient is from: Home              Anticipated d/c is to: SNF              Anticipated d/c date is: > 3 days              Patient currently is not medically stable to d/c.      DVT Prophylaxis  :  Heparin>>Eliquis  Lab Results  Component Value Date   PLT 115 (L) 04/19/2020    Diet :  Diet Order            Diet renal/carb modified with fluid restriction Diet-HS Snack? Nothing; Fluid restriction: 1200 mL Fluid; Room service appropriate? No; Fluid consistency: Thin  Diet effective now                  Inpatient Medications  Scheduled Meds: . sodium chloride   Intravenous Once  . amLODipine  5 mg Oral Daily  . apixaban  10 mg Oral BID   Followed by  . [START ON 04/20/2020] apixaban  5 mg Oral BID  . vitamin C  500 mg Oral Daily  . Chlorhexidine Gluconate Cloth  6 each Topical Q0600  . Chlorhexidine Gluconate Cloth  6 each Topical Q0600  . darbepoetin (ARANESP) injection - DIALYSIS  60 mcg Intravenous Q Fri-HD  . hydrALAZINE  50 mg Oral TID  . insulin aspart  0-6 Units Subcutaneous TID WC  . insulin detemir  10 Units Subcutaneous Daily  . Ipratropium-Albuterol  1 puff Inhalation Q6H  . loratadine  10 mg Oral Daily  . metoprolol tartrate  25 mg Oral BID  . nystatin   Topical BID  . pantoprazole  20 mg Oral Daily  . pravastatin  40 mg Oral QPM  . rOPINIRole  0.25 mg Oral QPM  . sodium bicarbonate  650 mg Oral BID   Continuous Infusions:  PRN Meds:.albuterol, bisacodyl, guaiFENesin-dextromethorphan, heparin, lip balm, ondansetron **OR** ondansetron (ZOFRAN) IV, sodium chloride flush  Antibiotics  :    Anti-infectives (From admission, onward)   Start     Dose/Rate Route Frequency Ordered Stop   04/11/20 1300  cefTRIAXone (ROCEPHIN) 1 g in sodium chloride 0.9 % 100 mL IVPB  Status:  Discontinued        1 g 200 mL/hr over 30 Minutes Intravenous Every 24 hours 04/11/20 1252 04/13/20 1418   04/11/20 1300  doxycycline  (VIBRA-TABS) tablet 100 mg  Status:  Discontinued        100 mg Oral Every 12 hours 04/11/20 1252 04/13/20 1418        Sheilyn Boehlke M.D on 04/19/2020 at 11:57 AM  To page go to [email or web address]  [facility] -  Office  [phone]       Objective:   Vitals:   04/19/20 0000 04/19/20 0400 04/19/20 0741 04/19/20 1138  BP: 128/64 140/72 (!) 149/84 129/72  Pulse: 84 82  82 77  Resp: 15 16 20 18   Temp: 98.2 F (36.8 C) 98.3 F (36.8 C) 98.1 F (36.7 C) 98 F (36.7 C)  TempSrc: Oral Oral Oral Oral  SpO2: 99% 99% 95% 96%  Weight:        Wt Readings from Last 3 Encounters:  04/16/20 98.6 kg  04/09/20 99 kg  02/25/19 115 kg     Intake/Output Summary (Last 24 hours) at 04/19/2020 1157 Last data filed at 04/19/2020 0842 Gross per 24 hour  Intake 480 ml  Output 300 ml  Net 180 ml     Physical Exam  Awake Alert, Oriented X 3, No new F.N deficits, Normal affect Symmetrical Chest wall movement, Good air movement bilaterally, CTAB RRR,No Gallops,Rubs or new Murmurs, No Parasternal Heave +ve B.Sounds, Abd Soft, No tenderness, No rebound - guarding or rigidity. No Cyanosis, Clubbing or edema, No new Rash or bruise      Data Review:    CBC Recent Labs  Lab 04/13/20 0839 04/14/20 0145 04/15/20 0624 04/16/20 0214 04/18/20 0432 04/19/20 0259  WBC 10.3 9.7 10.4 7.7 5.4 4.0  HGB 9.6* 10.7* 10.5* 10.6* 9.4* 8.7*  HCT 30.0* 32.5* 30.3* 31.9* 28.8* 27.7*  PLT 266 275 256 204 124* 115*  MCV 87.0 88.6 84.4 85.1 88.6 89.9  MCH 27.8 29.2 29.2 28.3 28.9 28.2  MCHC 32.0 32.9 34.7 33.2 32.6 31.4  RDW 15.5 15.8* 15.3 14.8 15.4 15.8*  LYMPHSABS 0.5* 0.4* 0.7 1.0  --   --   MONOABS 0.3 0.3 0.5 0.5  --   --   EOSABS 0.0 0.0 0.0 0.0  --   --   BASOSABS 0.0 0.0 0.0 0.0  --   --     Recent Labs  Lab 04/13/20 0839 04/14/20 0145 04/15/20 0624 04/16/20 0214 04/18/20 0432 04/19/20 0259  NA 142 140 139 141 138 139  K 4.4 4.4 3.9 3.6 3.3* 3.2*  CL 104 105 103 104  101 102  CO2 18* 19* 24 25 27 26   GLUCOSE 274* 397* 175* 192* 212* 194*  BUN 80* 91* 49* 53* 33* 35*  CREATININE 6.80* 6.78* 3.89* 4.22* 3.71* 4.42*  CALCIUM 8.7* 8.4* 8.0* 8.2* 8.1* 8.1*  AST 27 21 25 24 22   --   ALT 24 22 22 23 21   --   ALKPHOS 67 70 60 57 60  --   BILITOT 0.8 0.6 0.9 0.6 0.5  --   ALBUMIN 2.4* 2.5* 2.5* 2.4* 2.4* 2.2*  DDIMER 5.31* 3.85*  --   --   --   --     ------------------------------------------------------------------------------------------------------------------ No results for input(s): CHOL, HDL, LDLCALC, TRIG, CHOLHDL, LDLDIRECT in the last 72 hours.  Lab Results  Component Value Date   HGBA1C 6.6 (H) 04/04/2020   ------------------------------------------------------------------------------------------------------------------ No results for input(s): TSH, T4TOTAL, T3FREE, THYROIDAB in the last 72 hours.  Invalid input(s): FREET3  Cardiac Enzymes No results for input(s): CKMB, TROPONINI, MYOGLOBIN in the last 168 hours.  Invalid input(s): CK ------------------------------------------------------------------------------------------------------------------ No results found for: BNP  Micro Results Recent Results (from the past 240 hour(s))  Blood Culture (routine x 2)     Status: None   Collection Time: 04/11/20 10:18 AM   Specimen: BLOOD  Result Value Ref Range Status   Specimen Description BLOOD RIGHT ANTECUBITAL  Final   Special Requests   Final    BOTTLES DRAWN AEROBIC ONLY Blood Culture adequate volume   Culture   Final    NO GROWTH 5 DAYS Performed  at [facility], [place] [address], [place], [place] [address]    Report Status 04/16/2020 FINAL  Final  Blood Culture (routine x 2)     Status: None   Collection Time: 04/11/20 10:23 AM   Specimen: BLOOD  Result Value Ref Range Status   Specimen Description BLOOD LEFT ANTECUBITAL  Final   Special Requests   Final    BOTTLES DRAWN AEROBIC AND ANAEROBIC Blood Culture adequate volume    Culture   Final    NO GROWTH 5 DAYS Performed at [facility], [place] [address], [place], [place] [address]    Report Status 04/16/2020 FINAL  Final  MRSA PCR Screening     Status: None   Collection Time: 04/11/20 12:56 PM   Specimen: Nasal Mucosa; Nasopharyngeal  Result Value Ref Range Status   MRSA by PCR NEGATIVE NEGATIVE Final    Comment:        The GeneXpert MRSA Assay (FDA approved for NASAL specimens only), is one component of a comprehensive MRSA colonization surveillance program. It is not intended to diagnose MRSA infection nor to guide or monitor treatment for MRSA infections. Performed at [facility], [place] [address] [place], [place] [address]     Radiology Reports CT ABDOMEN PELVIS WO CONTRAST  Result Date: 04/03/2020 CLINICAL DATA:  Abdominal pain. EXAM: CT ABDOMEN AND PELVIS WITHOUT CONTRAST TECHNIQUE: Multidetector CT imaging of the abdomen and pelvis was performed following the standard protocol without IV contrast. COMPARISON:  02/20/2019 FINDINGS: Lower chest: Patchy ground-glass infiltrates in the right lower lobe could be due to developing pneumonia, atypical pneumonia or acute aspiration. No pleural effusions. The heart is normal in size. No pericardial effusion. Three-vessel coronary artery calcifications are noted. Small hiatal hernia. Hepatobiliary: No hepatic lesions are identified without contrast. No intrahepatic biliary dilatation. The gallbladder is surgically absent. No common bile duct dilatation. Pancreas: Marked fatty atrophy of the pancreas. No mass, inflammation or ductal dilatation. Spleen: Normal size.  No focal lesions. Adrenals/Urinary Tract: The adrenal glands are unremarkable. Small, chronically atrophied kidneys consistent with chronic renal failure. No worrisome renal lesions or hydroureteronephrosis. The bladder is unremarkable. Stomach/Bowel: The stomach, duodenum, small bowel and colon are unremarkable. No acute inflammatory  changes, mass lesions or obstructive findings. The terminal ileum is normal. Scattered colonic diverticulosis but no findings for acute diverticulitis. Vascular/Lymphatic: Significant diffuse vascular calcifications. No aneurysm. No mesenteric or retroperitoneal mass or adenopathy. No free air or free fluid. No leaking oral contrast. Reproductive: The uterus is surgically absent. Both ovaries are still present and appear normal. Other: Moderate-sized periumbilical abdominal wall hernia containing fat. Musculoskeletal: No significant bony findings. IMPRESSION: 1. No acute abdominal/pelvic findings, mass lesions or adenopathy. 2. Patchy ground-glass infiltrates in the right lower lobe could be due to developing pneumonia, atypical pneumonia or acute aspiration. 3. Status post cholecystectomy. No biliary dilatation. 4. Small, chronically atrophied kidneys consistent with chronic renal failure. 5. Moderate-sized periumbilical abdominal wall hernia containing fat. 6. Aortic atherosclerosis. Aortic Atherosclerosis (ICD10-I70.0). Electronically Signed   By: Marijo Sanes M.D.   On: 04/03/2020 19:19   CT HEAD WO CONTRAST  Result Date: 04/14/2020 CLINICAL DATA:  Delirium.  Altered mental status. EXAM: CT HEAD WITHOUT CONTRAST TECHNIQUE: Contiguous axial images were obtained from the base of the skull through the vertex without intravenous contrast. COMPARISON:  April 03, 2020. FINDINGS: Brain: No evidence of acute large vascular territory infarction, hemorrhage, hydrocephalus, extra-axial collection or mass lesion/mass effect. Similar cerebral atrophy with ex  vacuo ventricular dilation. Mild white matter hypodensities, likely related to chronic microvascular ischemic disease. Vascular: Calcific atherosclerosis. Skull: No acute fracture. Sinuses/Orbits: Left maxillary sinus retention cyst versus polyp. Unremarkable orbits. Other: No mastoid effusions. IMPRESSION: 1. No evidence of acute intracranial abnormality. 2.  Similar cerebral atrophy and ventriculomegaly. Electronically Signed   By: Margaretha Sheffield MD   On: 04/14/2020 16:08   CT Head Wo Contrast  Result Date: 04/03/2020 CLINICAL DATA:  Sudden onset of leg weakness today. EXAM: CT HEAD WITHOUT CONTRAST TECHNIQUE: Contiguous axial images were obtained from the base of the skull through the vertex without intravenous contrast. COMPARISON:  None. FINDINGS: Brain: Age advanced cerebral atrophy, ventriculomegaly and mild periventricular white matter disease. No extra-axial fluid collections are identified. No CT findings for acute hemispheric infarction or intracranial hemorrhage. No mass lesions. The brainstem and cerebellum are normal. Vascular: Moderate to advanced vascular calcifications but no aneurysm or hyperdense vessels. Skull: No skull fracture or bone lesions. Sinuses/Orbits: The paranasal sinuses and mastoid air cells are clear except for a 2 cm mucous retention cyst or polyp in the left maxillary sinus. The globes are intact. Other: No scalp lesions or scalp hematoma. IMPRESSION: 1. Age advanced cerebral atrophy, ventriculomegaly and mild periventricular white matter disease. 2. No acute intracranial findings or mass lesions. Electronically Signed   By: Marijo Sanes M.D.   On: 04/03/2020 19:14   US RENAL  Result Date: 04/04/2020 CLINICAL DATA:  Acute kidney injury EXAM: RENAL / URINARY TRACT ULTRASOUND COMPLETE COMPARISON:  CT 04/03/2020 FINDINGS: Technical note: Significantly limited examination secondary to poor penetration related to patient body habitus and shadowing from overlying bowel gas. Right Kidney: Limited. Echogenic renal parenchyma with cortical thinning compatible with chronic medical renal disease. Right renal length of approximately 10.6 cm. No shadowing stone or hydronephrosis is identified. Left Kidney: Very limited evaluation. Visualized left kidney appears diffusely echogenic with renal cortical thinning compatible with chronic  medical renal disease. No obvious hydronephrosis. Bladder: Appears normal for degree of bladder distention. Other: None. IMPRESSION: 1. Limited exam. 2. No sonographic evidence of obstructive uropathy. 3. Atrophic, echogenic kidneys compatible with chronic medical renal disease. Electronically Signed   By: Davina Poke D.O.   On: 04/04/2020 12:18   DG CHEST PORT 1 VIEW  Result Date: 04/11/2020 CLINICAL DATA:  Hemodialysis catheter placement EXAM: PORTABLE CHEST 1 VIEW COMPARISON:  April 11, 2020 FINDINGS: The heart size is enlarged. Bilateral airspace disease is again noted and has progressed in the right mid and right upper lung zones. There is vascular congestion with small bilateral pleural effusions. There is a right-sided central venous catheter with tip projecting over the mid SVC. The catheter fall short of the cavoatrial junction by approximately 6-7 cm. There is no evidence for pneumothorax. Aortic calcifications are noted. The main pulmonary artery is dilated. IMPRESSION: 1. Interval worsening of bilateral airspace disease, greatest in the right mid and right upper lung zones. 2. Hemodialysis catheter as above. While the tip likely terminates in the SVC, a catheter fall short of the cavoatrial junction by at least 6 cm. 3. Persistent cardiomegaly with vascular congestion and small bilateral pleural effusions. Electronically Signed   By: Constance Holster M.D.   On: 04/11/2020 17:23   DG Chest Port 1 View  Result Date: 04/11/2020 CLINICAL DATA:  Shortness of breath. Cough. Hypoxia. COVID-19 virus infection. EXAM: PORTABLE CHEST 1 VIEW COMPARISON:  04/03/2020 FINDINGS: Heart size remains normal. New mild airspace opacity is seen in the central right upper lobe  and peripheral left upper lobe. No evidence of pleural effusion. IMPRESSION: New mild bilateral upper lobe airspace disease, suspicious for viral pneumonia. Electronically Signed   By: Marlaine Hind M.D.   On: 04/11/2020 11:30   DG  Chest Port 1 View  Result Date: 04/03/2020 CLINICAL DATA:  Right lower lobe infiltrate EXAM: PORTABLE CHEST 1 VIEW COMPARISON:  Same day CT abdomen pelvis, 04/03/2020, chest radiographs, 04/05/2014 FINDINGS: The heart size and mediastinal contours are within normal limits. Subtle heterogeneous airspace opacity of the right lung base. The visualized skeletal structures are unremarkable. IMPRESSION: Subtle heterogeneous airspace opacity of the right lung base, in keeping with findings of prior CT and consistent with infection or aspiration. Electronically Signed   By: Eddie Candle M.D.   On: 04/03/2020 20:23   ECHOCARDIOGRAM COMPLETE  Result Date: 04/04/2020    ECHOCARDIOGRAM REPORT   Patient Name:   DYNISHA DUE Zeitz Date of Exam: 04/04/2020 Medical Rec #:  [id number]     Height:       62.0 in Accession #:    [id number]    Weight:       211.2 lb Date of Birth:  1950-07-20     BSA:          1.956 m Patient Age:    39 years      BP:           146/78 mmHg Patient Gender: F             HR:           82 bpm. Exam Location:  Inpatient Procedure: 2D Echo, Cardiac Doppler, Color Doppler and Intracardiac            Opacification Agent Indications:    Abnormal ECG 794.31 / R94.31  History:        Patient has prior history of Echocardiogram examinations, most                 recent 11/11/1996. CAD, Signs/Symptoms:Shortness of Breath and                 Chest Pain; Risk Factors:Hypertension, Diabetes and                 Dyslipidemia.  Sonographer:    Bernadene Person RDCS Referring Phys: [id number] [place]  1. Left ventricular ejection fraction, by estimation, is 60 to 65%. The left ventricle has normal function. The left ventricle has no regional wall motion abnormalities. Left ventricular diastolic parameters are consistent with Grade II diastolic dysfunction (pseudonormalization). Elevated left ventricular end-diastolic pressure.  2. Right ventricular systolic function is normal. The right ventricular size  is normal. There is normal pulmonary artery systolic pressure.  3. The mitral valve is degenerative. Trivial mitral valve regurgitation. No evidence of mitral stenosis. Moderate mitral annular calcification.  4. The aortic valve is tricuspid. Aortic valve regurgitation is not visualized. Mild to moderate aortic valve sclerosis/calcification is present, without any evidence of aortic stenosis.  5. The inferior vena cava is normal in size with greater than 50% respiratory variability, suggesting right atrial pressure of 3 mmHg. FINDINGS  Left Ventricle: Left ventricular ejection fraction, by estimation, is 60 to 65%. The left ventricle has normal function. The left ventricle has no regional wall motion abnormalities. Definity contrast agent was given IV to delineate the left ventricular  endocardial borders. The left ventricular internal cavity size was normal in size. There is no left ventricular hypertrophy. Left ventricular diastolic parameters are consistent  with Grade II diastolic dysfunction (pseudonormalization). Elevated left ventricular end-diastolic pressure. Right Ventricle: The right ventricular size is normal. No increase in right ventricular wall thickness. Right ventricular systolic function is normal. There is normal pulmonary artery systolic pressure. The tricuspid regurgitant velocity is 2.75 m/s, and  with an assumed right atrial pressure of 3 mmHg, the estimated right ventricular systolic pressure is 56.4 mmHg. Left Atrium: Left atrial size was normal in size. Right Atrium: Right atrial size was normal in size. Pericardium: There is no evidence of pericardial effusion. Mitral Valve: The mitral valve is degenerative in appearance. There is moderate thickening of the mitral valve leaflet(s). There is moderate calcification of the mitral valve leaflet(s). Moderate mitral annular calcification. Trivial mitral valve regurgitation. No evidence of mitral valve stenosis. Tricuspid Valve: The tricuspid  valve is normal in structure. Tricuspid valve regurgitation is mild . No evidence of tricuspid stenosis. Aortic Valve: The aortic valve is tricuspid. Aortic valve regurgitation is not visualized. Mild to moderate aortic valve sclerosis/calcification is present, without any evidence of aortic stenosis. Pulmonic Valve: The pulmonic valve was normal in structure. Pulmonic valve regurgitation is not visualized. No evidence of pulmonic stenosis. Aorta: The aortic root is normal in size and structure. Venous: The inferior vena cava is normal in size with greater than 50% respiratory variability, suggesting right atrial pressure of 3 mmHg. IAS/Shunts: No atrial level shunt detected by color flow Doppler.  LEFT VENTRICLE PLAX 2D LVIDd:         5.80 cm  Diastology LVIDs:         4.10 cm  LV e' medial:    3.81 cm/s LV PW:         0.70 cm  LV E/e' medial:  24.3 LV IVS:        1.00 cm  LV e' lateral:   4.57 cm/s LVOT diam:     2.00 cm  LV E/e' lateral: 20.2 LV SV:         78 LV SV Index:   40 LVOT Area:     3.14 cm  RIGHT VENTRICLE RV S prime:     13.10 cm/s TAPSE (M-mode): 2.0 cm LEFT ATRIUM             Index       RIGHT ATRIUM           Index LA diam:        3.40 cm 1.74 cm/m  RA Area:     18.10 cm LA Vol (A2C):   56.0 ml 28.62 ml/m RA Volume:   47.40 ml  24.23 ml/m LA Vol (A4C):   51.1 ml 26.12 ml/m LA Biplane Vol: 53.4 ml 27.29 ml/m  AORTIC VALVE LVOT Vmax:   104.00 cm/s LVOT Vmean:  86.300 cm/s LVOT VTI:    0.249 m  AORTA Ao Root diam: 3.30 cm Ao Asc diam:  3.60 cm MITRAL VALVE                TRICUSPID VALVE MV Area (PHT): 2.48 cm     TR Peak grad:   30.2 mmHg MV Decel Time: 306 msec     TR Vmax:        275.00 cm/s MV E velocity: 92.40 cm/s MV A velocity: 138.00 cm/s  SHUNTS MV E/A ratio:  0.67         Systemic VTI:  0.25 m  Systemic Diam: 2.00 cm Jenkins Rouge MD Electronically signed by Jenkins Rouge MD Signature Date/Time: 04/04/2020/11:23:04 AM    Final    VAS [place] LOWER EXTREMITY  VENOUS (DVT)  Result Date: 04/13/2020  Lower Venous DVT Study Indications: Covid +, elevated D dimer.  Limitations: Body habitus, cooperation and poor ultrasound/tissue interface. Performing Technologist: Antonieta Pert RDMS, RVT  Examination Guidelines: A complete evaluation includes B-mode imaging, spectral Doppler, color Doppler, and power Doppler as needed of all accessible portions of each vessel. Bilateral testing is considered an integral part of a complete examination. Limited examinations for reoccurring indications may be performed as noted. The reflux portion of the exam is performed with the patient in reverse Trendelenburg.  +---------+---------------+---------+-----------+----------+--------------+ RIGHT    CompressibilityPhasicitySpontaneityPropertiesThrombus Aging +---------+---------------+---------+-----------+----------+--------------+ CFV      Full           Yes      Yes                                 +---------+---------------+---------+-----------+----------+--------------+ SFJ      Full                                                        +---------+---------------+---------+-----------+----------+--------------+ FV Prox  Full                                                        +---------+---------------+---------+-----------+----------+--------------+ FV Mid   Full                                                        +---------+---------------+---------+-----------+----------+--------------+ FV DistalFull                                                        +---------+---------------+---------+-----------+----------+--------------+ PFV      Full                                                        +---------+---------------+---------+-----------+----------+--------------+ POP      Full           Yes      Yes                                 +---------+---------------+---------+-----------+----------+--------------+ PTV       Full                                                        +---------+---------------+---------+-----------+----------+--------------+  PERO     Partial                            dilated   Acute          +---------+---------------+---------+-----------+----------+--------------+ GSV      Full                                                        +---------+---------------+---------+-----------+----------+--------------+   +---------+---------------+---------+-----------+----------+--------------+ LEFT     CompressibilityPhasicitySpontaneityPropertiesThrombus Aging +---------+---------------+---------+-----------+----------+--------------+ CFV      Full           Yes      Yes                                 +---------+---------------+---------+-----------+----------+--------------+ SFJ      Full                                                        +---------+---------------+---------+-----------+----------+--------------+ FV Prox  Full                                                        +---------+---------------+---------+-----------+----------+--------------+ FV Mid   Full                                                        +---------+---------------+---------+-----------+----------+--------------+ FV DistalFull                                                        +---------+---------------+---------+-----------+----------+--------------+ PFV      Full                                                        +---------+---------------+---------+-----------+----------+--------------+ POP      Full           Yes      Yes                                 +---------+---------------+---------+-----------+----------+--------------+ PTV      Full                                                        +---------+---------------+---------+-----------+----------+--------------+  PERO     Full                                                         +---------+---------------+---------+-----------+----------+--------------+ GSV      Full                                                        +---------+---------------+---------+-----------+----------+--------------+     Summary: RIGHT: - Findings consistent with acute deep vein thrombosis involving the right peroneal veins. - No cystic structure found in the popliteal fossa. - Small segment of thrombus in the right calf.  LEFT: - There is no evidence of deep vein thrombosis in the lower extremity. However, portions of this examination were limited- see technologist comments above.  - No cystic structure found in the popliteal fossa. - Difficult exam bilaterally due to large body habitus, poor penetration and poor patient cooperation.  *See table(s) above for measurements and observations. Electronically signed by Harold Barban MD on 04/13/2020 at 6:07:50 PM.    Final

## 2020-04-19 NOTE — Progress Notes (Signed)
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  The rising bun/creat in setting of covid-19 infection.  The first HD on 04/12/20, second HD 12/22, third HD 12/24.   The creatinine level went up to 4.4 from 3.7, oliguric and no sign of renal recovery.  Atrophic kidneys on ultrasound.  She is now ESRD. Consult IR to convert tunneled HD catheter placement. Plan for HD today. Consult social worker to arrange outpatient HD center.  I have discussed this with the patient and she agreed with the plan.  # Covid-19 infection- per primary team.   # Vascular access- RIJ temp HD catheter placed 04/11/20. Had been referred to [facility] for access placement as OP.  Plan for [facility] by IR.  Hold off on vascular procedure because of Covid positive.  # HTN/Volume-BP acceptable.  Volume management with dialysis.  # Anemia of CKD stage IV: Hemoglobin 8.7, continue ESA.  No iron because of Covid.Transfuse prn.  # CAD- stable  # Right peroneal DVT- started on Eliquis per primary.  #Hypokalemia: 4K bath during HD.  Subjective: Seen and examined at bedside.  Not much urine output.  Denies nausea vomiting chest pain or shortness of breath. Objective Vital signs in last 24 hours: Vitals:   04/19/20 0000 04/19/20 0400 04/19/20 0741 04/19/20 1138  BP: 128/64 140/72 (!) 149/84 129/72  Pulse: 84 82 82 77  Resp: 15 16 20 18   Temp: 98.2 F (36.8 C) 98.3 F (36.8 C) 98.1 F (36.7 C) 98 F (36.7 C)  TempSrc: Oral Oral Oral Oral  SpO2: 99% 99% 95% 96%  Weight:       Weight change:   Intake/Output Summary (Last 24 hours) at 04/19/2020 1405 Last data filed at 04/19/2020 5732 Gross per 24 hour  Intake 480 ml  Output 300 ml  Net 180 ml       Labs: Basic Metabolic Panel: Recent Labs  Lab 04/16/20 0214 04/18/20 0432 04/19/20 0259  NA 141 138 139  K 3.6 3.3* 3.2*  CL 104 101 102   CO2 25 27 26   GLUCOSE 192* 212* 194*  BUN 53* 33* 35*  CREATININE 4.22* 3.71* 4.42*  CALCIUM 8.2* 8.1* 8.1*  PHOS  --   --  3.5   Liver Function Tests: Recent Labs  Lab 04/15/20 0624 04/16/20 0214 04/18/20 0432 04/19/20 0259  AST 25 24 22   --   ALT 22 23 21   --   ALKPHOS 60 57 60  --   BILITOT 0.9 0.6 0.5  --   PROT 5.4* 5.1* 4.6*  --   ALBUMIN 2.5* 2.4* 2.4* 2.2*   No results for input(s): LIPASE, AMYLASE in the last 168 hours. No results for input(s): AMMONIA in the last 168 hours. CBC: Recent Labs  Lab 04/14/20 0145 04/15/20 0624 04/16/20 0214 04/18/20 0432 04/19/20 0259  WBC 9.7 10.4 7.7 5.4 4.0  NEUTROABS 8.7* 8.9* 6.1  --   --   HGB 10.7* 10.5* 10.6* 9.4* 8.7*  HCT 32.5* 30.3* 31.9* 28.8* 27.7*  MCV 88.6 84.4 85.1 88.6 89.9  PLT 275 256 204 124* 115*   Cardiac Enzymes: No results for input(s): CKTOTAL, CKMB, CKMBINDEX, TROPONINI in the last 168 hours. CBG: Recent Labs  Lab 04/18/20 1156 04/18/20 1721 04/18/20 1952 04/19/20 0719 04/19/20 1136  GLUCAP 200* 223* 174* 202* 252*    Iron Studies:  Recent Labs    04/16/20 1827  IRON 58  TIBC 190*  FERRITIN 594*   Studies/Results: No results found.  Medications: Infusions:   Scheduled Medications: . sodium chloride   Intravenous Once  . amLODipine  5 mg Oral Daily  . apixaban  10 mg Oral BID   Followed by  . [START ON 04/20/2020] apixaban  5 mg Oral BID  . vitamin C  500 mg Oral Daily  . Chlorhexidine Gluconate Cloth  6 each Topical Q0600  . Chlorhexidine Gluconate Cloth  6 each Topical Q0600  . darbepoetin (ARANESP) injection - DIALYSIS  60 mcg Intravenous Q Fri-HD  . hydrALAZINE  50 mg Oral TID  . insulin aspart  0-6 Units Subcutaneous TID WC  . insulin detemir  10 Units Subcutaneous Daily  . Ipratropium-Albuterol  1 puff Inhalation Q6H  . loratadine  10 mg Oral Daily  . metoprolol tartrate  25 mg Oral BID  . nystatin   Topical BID  . pantoprazole  20 mg Oral Daily  . pravastatin   40 mg Oral QPM  . rOPINIRole  0.25 mg Oral QPM  . sodium bicarbonate  650 mg Oral BID    have reviewed scheduled and prn medications.  Physical Exam: General:NAD, comfortable Heart:RRR, s1s2 nl Lungs:clear b/l, no crackle Abdomen:soft, Non-tender, non-distended Extremities:No edema Dialysis Access: IJ temporary HD catheter  Theo Krumholz Tanna Furry 04/19/2020,2:05 PM  LOS: 8 days

## 2020-04-19 NOTE — Progress Notes (Signed)
CBGs still elevated on very sensitive scale due to steroids. Ok to increase to moderate scale per Dr. Waldron Labs.  Onnie Boer, PharmD, BCIDP, AAHIVP, CPP Infectious Disease Pharmacist 04/19/2020 3:06 PM

## 2020-04-20 ENCOUNTER — Encounter (HOSPITAL_COMMUNITY): Payer: Self-pay | Admitting: Internal Medicine

## 2020-04-20 ENCOUNTER — Other Ambulatory Visit: Payer: Self-pay

## 2020-04-20 DIAGNOSIS — N186 End stage renal disease: Secondary | ICD-10-CM

## 2020-04-20 LAB — RENAL FUNCTION PANEL
Albumin: 2.3 g/dL — ABNORMAL LOW (ref 3.5–5.0)
Anion gap: 9 (ref 5–15)
BUN: 38 mg/dL — ABNORMAL HIGH (ref 8–23)
CO2: 26 mmol/L (ref 22–32)
Calcium: 8.2 mg/dL — ABNORMAL LOW (ref 8.9–10.3)
Chloride: 104 mmol/L (ref 98–111)
Creatinine, Ser: 4.67 mg/dL — ABNORMAL HIGH (ref 0.44–1.00)
GFR, Estimated: 10 mL/min — ABNORMAL LOW (ref >60)
Glucose, Bld: 104 mg/dL — ABNORMAL HIGH (ref 70–99)
Phosphorus: 4.1 mg/dL (ref 2.5–4.6)
Potassium: 3.2 mmol/L — ABNORMAL LOW (ref 3.5–5.1)
Sodium: 139 mmol/L (ref 135–145)

## 2020-04-20 LAB — CBC
HCT: 28 % — ABNORMAL LOW (ref 36.0–46.0)
Hemoglobin: 8.8 g/dL — ABNORMAL LOW (ref 12.0–15.0)
MCH: 28.6 pg (ref 26.0–34.0)
MCHC: 31.4 g/dL (ref 30.0–36.0)
MCV: 90.9 fL (ref 80.0–100.0)
Platelets: 105 10*3/uL — ABNORMAL LOW (ref 150–400)
RBC: 3.08 MIL/uL — ABNORMAL LOW (ref 3.87–5.11)
RDW: 15.8 % — ABNORMAL HIGH (ref 11.5–15.5)
WBC: 4 10*3/uL (ref 4.0–10.5)
nRBC: 0 % (ref 0.0–0.2)

## 2020-04-20 LAB — GLUCOSE, CAPILLARY
Glucose-Capillary: 106 mg/dL — ABNORMAL HIGH (ref 70–99)
Glucose-Capillary: 97 mg/dL (ref 70–99)
Glucose-Capillary: 182 mg/dL — ABNORMAL HIGH (ref 70–99)
Glucose-Capillary: 164 mg/dL — ABNORMAL HIGH (ref 70–99)

## 2020-04-20 MED ORDER — OXYCODONE HCL 5 MG PO TABS
5.0000 mg | ORAL_TABLET | Freq: Once | ORAL | Status: AC
  Administered 2020-04-20: 5 mg via ORAL
  Filled 2020-04-20: qty 1

## 2020-04-20 MED ORDER — LIDOCAINE HCL (PF) 1 % IJ SOLN
5.0000 mL | INTRAMUSCULAR | Status: DC | PRN
Start: 2020-04-20 — End: 2020-04-20

## 2020-04-20 MED ORDER — LIDOCAINE-PRILOCAINE 2.5-2.5 % EX CREA: 1.0000 "application " | TOPICAL_CREAM | CUTANEOUS | Status: DC | PRN

## 2020-04-20 MED ORDER — ALTEPLASE 2 MG IJ SOLR: 2.0000 mg | Freq: Once | INTRAMUSCULAR | Status: DC | PRN

## 2020-04-20 MED ORDER — DIPHENHYDRAMINE HCL 50 MG/ML IJ SOLN
12.5000 mg | Freq: Once | INTRAMUSCULAR | Status: AC
  Administered 2020-04-20: 12.5 mg via INTRAVENOUS
  Filled 2020-04-20: qty 1

## 2020-04-20 MED ORDER — HEPARIN SODIUM (PORCINE) 1000 UNIT/ML IJ SOLN
INTRAMUSCULAR | Status: AC
  Administered 2020-04-20: 2600 [IU] via INTRAVENOUS_CENTRAL
  Filled 2020-04-20: qty 3

## 2020-04-20 MED ORDER — SODIUM CHLORIDE 0.9 % IV SOLN: 100.0000 mL | INTRAVENOUS | Status: DC | PRN

## 2020-04-20 MED ORDER — HEPARIN SODIUM (PORCINE) 1000 UNIT/ML DIALYSIS: 1000.0000 [IU] | INTRAMUSCULAR | Status: DC | PRN

## 2020-04-20 MED ORDER — APIXABAN 5 MG PO TABS
5.0000 mg | ORAL_TABLET | Freq: Two times a day (BID) | ORAL | Status: DC
  Administered 2020-04-21: 5 mg via ORAL
  Filled 2020-04-20: qty 1

## 2020-04-20 MED ORDER — PENTAFLUOROPROP-TETRAFLUOROETH EX AERO: 1.0000 "application " | INHALATION_SPRAY | CUTANEOUS | Status: DC | PRN

## 2020-04-20 MED ORDER — IPRATROPIUM-ALBUTEROL 20-100 MCG/ACT IN AERS
1.0000 | INHALATION_SPRAY | Freq: Two times a day (BID) | RESPIRATORY_TRACT | Status: DC
  Administered 2020-04-20 – 2020-04-21 (×3): 1 via RESPIRATORY_TRACT
  Filled 2020-04-20: qty 4

## 2020-04-20 MED ORDER — CEFAZOLIN SODIUM-DEXTROSE 2-4 GM/100ML-% IV SOLN: 2.0000 g | INTRAVENOUS | Status: AC

## 2020-04-20 NOTE — Progress Notes (Signed)
Physical Therapy Treatment Patient Details Name: Ann [name] MRN: [id number] DOB: [birth year] Today's Date: 04/20/2020    History of Present Illness Pt is a 69 y.o. female recently admitted to [facility] for COVID-19 related GI and respiratory distress (04/03/20-04/09/20 declined SNF recommendation and went home), now admitted 04/11/20 with worsening symptoms. Workup for acute hypoxic respiratory failure due to COVID-19 viral pneumonitis, progressive AKI on CKD IV, acute metabolic encephalopathy. S/p RIJ temp HD cath placement 12/19. (+) RLE DVT 12/21. PMH includes HTN, CAD, DM, CKD, obesity, chronic pain.    PT Comments    Pt was only able to walk 6' away from the bed before she needed to sit down.  Used RW for support and was min to mod assist overall for bed mobility and gait.  She is very limited in her endurance and tolerance of gait at this time.  She would benefit from post acute rehab to build her strength prior to returning home.  VSS throughout on RA with no signs of DOE or increased WOB.  PT will continue to follow acutely for safe mobility progression.  Would be safer progressing gait with chair to follow.    Follow Up Recommendations  SNF     Equipment Recommendations  Wheelchair cushion (measurements PT);Wheelchair (measurements PT)    Recommendations for Other Services       Precautions / Restrictions Precautions Precautions: Fall;Other (comment) Precaution Comments: Urine incontinence    Mobility  Bed Mobility Overal bed mobility: Needs Assistance Bed Mobility: Rolling;Sidelying to Sit;Sit to Sidelying Rolling: Supervision (cues for log roll to help back) Sidelying to sit: Min assist (min hand held assist to pull up)     Sit to sidelying: Supervision General bed mobility comments: Pt heavily reliant on rails for support during transitions, slow to move, extra time needed.  Transfers Overall transfer level: Needs assistance Equipment used: Rolling walker (2  wheeled)   Sit to Stand: Min assist         General transfer comment: Min assist to come to standing from bed x 2.  Pt pulls on RW to stand, min assist to finish powering up and to keep balance once standing.  Ambulation/Gait Ambulation/Gait assistance: Mod assist Gait Distance (Feet): 6 Feet Assistive device: Rolling walker (2 wheeled) Gait Pattern/deviations: Step-through pattern;Shuffle;Trunk flexed     General Gait Details: Pt walked forward 6', stopped leaned on her elbows and started sitting down.  She was unable to go further.  I told her we were not at the bed, "yes we are, it is right there".  Then she looked back and realized she did have to back up.  Backing up required mod assist as she was sinking lower and lower for the return to bed.   Stairs             Wheelchair Mobility    Modified Rankin (Stroke Patients Only)       Balance Overall balance assessment: Needs assistance Sitting-balance support: Feet supported;Bilateral upper extremity supported Sitting balance-Leahy Scale: Fair     Standing balance support: Bilateral upper extremity supported Standing balance-Leahy Scale: Poor                              Cognition Arousal/Alertness: Awake/alert Behavior During Therapy: WFL for tasks assessed/performed Overall Cognitive Status: Impaired/Different from baseline Area of Impairment: Problem solving;Safety/judgement;Memory  Memory: Decreased short-term memory Following Commands: Follows one step commands with increased time Safety/Judgement: Decreased awareness of safety Awareness: Emergent Problem Solving: Slow processing;Decreased initiation;Requires verbal cues General Comments: Very slow to process, difficulty with recall, decreased awareness of safety (started sitting ~6' away from the bed thinking she was at the bed). Very tangential, needs redirection to task frequently.      Exercises General  Exercises - Lower Extremity Ankle Circles/Pumps: AROM;Both;20 reps Long Arc Quad: AROM;Both;10 reps    General Comments General comments (skin integrity, edema, etc.): HR and O2 sats stable on RA throughout.  QTC interval was high (560) and ringing out on the monitor.      Pertinent Vitals/Pain Pain Assessment: Faces Faces Pain Scale: Hurts even more Pain Location: chronic low back and bil hip pain Pain Descriptors / Indicators: Grimacing;Guarding Pain Intervention(s): Limited activity within patient's tolerance;Monitored during session;Repositioned    Home Living                      Prior Function            PT Goals (current goals can now be found in the care plan section) Acute Rehab PT Goals Patient Stated Goal: to go home when she is able Progress towards PT goals: Progressing toward goals    Frequency    Min 2X/week      PT Plan Current plan remains appropriate    Co-evaluation              AM-PAC PT "6 Clicks" Mobility   Outcome Measure  Help needed turning from your back to your side while in a flat bed without using bedrails?: A Little Help needed moving from lying on your back to sitting on the side of a flat bed without using bedrails?: A Little Help needed moving to and from a bed to a chair (including a wheelchair)?: A Little Help needed standing up from a chair using your arms (e.g., wheelchair or bedside chair)?: A Little Help needed to walk in hospital room?: A Lot Help needed climbing 3-5 steps with a railing? : Total 6 Click Score: 15    End of Session   Activity Tolerance: Patient limited by pain;Patient limited by fatigue Patient left: in bed;with call bell/phone within reach;with bed alarm set   PT Visit Diagnosis: Other abnormalities of gait and mobility (R26.89);Muscle weakness (generalized) (M62.81)     Time: 6759-1638 PT Time Calculation (min) (ACUTE ONLY): 36 min  Charges:  $Gait Training: 8-22 mins $Therapeutic  Activity: 8-22 mins                    Verdene Lennert, PT, DPT  Acute Rehabilitation [phone] pager [phone]) [phone] office

## 2020-04-20 NOTE — Consult Note (Signed)
Chief Complaint: Ann was seen in consultation today for  Chief Complaint  Ann presents with  . Covid Positive    Referring Physician(s): Dr. Carolin Sicks  Supervising Physician: Jacqulynn Cadet  Ann Status: [facility] - In-pt  History of Present Illness: Ann [name] is a 69 y.o. female with a medical history significant for stage IV CKD with anemia, CAD, DM2, asthma and OSA (CPAP) and COVID-19 infection (04/03/20) for which she was briefly hospitalized. She returned to the ED 04/11/20 with confusion and shortness of breath and lab work up revealed markedly elevated BUN/Cr. levels. A right IJ temporary dialysis catheter was placed by the Critical Care team on 04/11/20. She was also diagnosed with right peroneal vein DVTs and was started on Eliquis.   Ann [name] has undergone several rounds of hemodialysis since then and her team is preparing her for discharge with outpatient dialysis. Interventional Radiology has been asked to evaluate this Ann for the placement of a tunneled dialysis catheter for ongoing hemodialysis therapy.   Past Medical History:  Diagnosis Date  . Anemia 09/29/2008   anemia of chronic disease  . Anginal pain ([place])    no recent chest pain  . Arthritis   . Asthma 09/29/2008  . CAD (coronary artery disease) 09/29/2008   hx  PCI - [facility]. Stress test 10/2011 nl with EF 58%. [facility] card., last cath 2010  . Chest pain 02/16/2009  . CKD (chronic kidney disease), stage IV ([place]) 10/20/2009  . GERD (gastroesophageal reflux disease)   . Hyperlipidemia 10/21/2009  . Hypertension 09/29/2008  . Insulin dependent diabetes mellitus type IA ([place]) 09/29/2008  . Lumbar back pain 02/16/2009   states pain goes down right leg  . Myalgia 02/16/2009  . Shortness of breath    with exerction  . Shoulder pain, left 03/16/2009   occurs at night  . Sleep apnea    CPAP    Past Surgical History:  Procedure Laterality Date  . ABDOMINAL HYSTERECTOMY    .  APPENDECTOMY    . CARDIAC CATHETERIZATION  2008   in [place], [place] stable  . CARDIAC CATHETERIZATION  2010   patent stents in mid and distal RCA; high grade disease in prox & mid AV groove LCX& OM; mod disease in prox & mid LAD normal LV FUNCTION--TREATED MEDICALLY    . CATARACT EXTRACTION, BILATERAL    . CHOLECYSTECTOMY    . CORONARY ANGIOPLASTY WITH STENT PLACEMENT  2005   2 vessel PCI at [place].  Divide   EF 60%  . TOTAL KNEE ARTHROPLASTY  12/2011   Left  . TOTAL KNEE ARTHROPLASTY  11/28/2011   Procedure: TOTAL KNEE ARTHROPLASTY;  Surgeon: Hessie Dibble, MD;  Location: [facility];  Service: Orthopedics;  Laterality: Right;    Allergies: Codeine and Metronidazole  Medications: Prior to Admission medications   Medication Sig Start Date End Date Taking? Authorizing Provider  ACCU-CHEK FASTCLIX LANCETS MISC USE AS DIRECTED TO CHECK BLOOD SUGAR FOUR TIMES A DAY(BEFORE MEALS AND AT BEDTIME). Ann taking differently: 1 each by Other route 4 (four) times daily. 10/14/12  Yes Sid Falcon, MD  ACCU-CHEK SMARTVIEW test strip CHECK BLOOD SUGAR BEFORE MEALS AND AT BEDTIME Ann taking differently: 1 each by Other route 4 (four) times daily -  before meals and at bedtime. 10/14/12  Yes Sid Falcon, MD  amLODipine (NORVASC) 10 MG tablet TAKE 1 TABLET BY MOUTH DAILY Ann taking differently: Take 10 mg by mouth daily.  02/01/12  Yes Neta Ehlers, MD  aspirin EC 81 MG tablet Take 81 mg by mouth daily.   Yes [provider]  DULoxetine (CYMBALTA) 60 MG capsule Take 60 mg by mouth daily. 03/22/20  Yes [provider]  gabapentin (NEURONTIN) 100 MG capsule Take 1 capsule (100 mg total) by mouth at bedtime. 02/28/19  Yes Charolotte Capuchin, MD  hydrALAZINE (APRESOLINE) 50 MG tablet Take 50 mg by mouth 3 (three) times daily. 01/14/20  Yes [provider]  Insulin Pen Needle (EASY TOUCH PEN NEEDLES) 31G X 8 MM MISC Use to inject insulin as  instructed. 02/02/12  Yes Bartholomew Crews, MD  isosorbide mononitrate (IMDUR) 30 MG 24 hr tablet TAKE 1 TABLET BY MOUTH ONCE DAILY Ann taking differently: Take 30 mg by mouth daily. 11/04/14  Yes Lorretta Harp, MD  metoprolol tartrate (LOPRESSOR) 50 MG tablet Take 0.5 tablets (25 mg total) by mouth 2 (two) times daily. 04/09/20  Yes Elgergawy, Silver Huguenin, MD  pantoprazole (PROTONIX) 20 MG tablet TAKE 1 TABLET BY MOUTH DAILY Ann taking differently: Take 20 mg by mouth daily. 02/05/12  Yes Paya, Cletus Gash, DO  pravastatin (PRAVACHOL) 40 MG tablet TAKE ONE TABLET BY MOUTH EVERY EVENING Ann taking differently: Take 40 mg by mouth at bedtime. 06/28/12  Yes Neta Ehlers, MD  rOPINIRole (REQUIP) 0.25 MG tablet Take 0.25 mg by mouth every evening. 02/27/20  Yes [provider]  sodium bicarbonate 650 MG tablet Take 650 mg by mouth 2 (two) times daily. 03/22/20  Yes [provider]  TOUJEO SOLOSTAR 300 UNIT/ML Solostar Pen Inject 10 Units into the skin at bedtime. 02/27/20  Yes [provider]  apixaban (ELIQUIS) 5 MG TABS tablet Take 1 tablet (5 mg total) by mouth 2 (two) times daily. 04/20/20   Elgergawy, Silver Huguenin, MD     Family History  Problem Relation Age of Onset  . Heart attack Father   . Heart disease Sister   . Heart disease Brother   . Diabetes Mother   . Hypertension Mother   . Heart disease Mother   . Heart attack Mother   . Stroke Mother   . Colon cancer Neg Hx     Social History   Socioeconomic History  . Marital status: Legally Separated    Spouse name: Not on file  . Number of children: 4  . Years of education: 25  . Highest education level: Not on file  Occupational History    Employer: UNEMPLOYED    Comment: disability  Tobacco Use  . Smoking status: Never Smoker  . Smokeless tobacco: Current User    Types: Snuff  . Tobacco comment: using "all my life"  Vaping Use  . Vaping Use: Never used  Substance and Sexual Activity   . Alcohol use: No    Alcohol/week: 0.0 standard drinks  . Drug use: No  . Sexual activity: Not Currently  Other Topics Concern  . Not on file  Social History Narrative  . Not on file   Social Determinants of Health   Financial Resource Strain: Not on file  Food Insecurity: Not on file  Transportation Needs: Not on file  Physical Activity: Not on file  Stress: Not on file  Social Connections: Not on file    Review of Systems: A 12 point ROS discussed and pertinent positives are indicated in the HPI above.  All other systems are negative.  Review of Systems  Constitutional: Negative for appetite change and fatigue.  Respiratory: Negative for cough and shortness of breath.   Cardiovascular: Negative for chest pain and leg swelling.  Gastrointestinal: Negative for abdominal pain, diarrhea, nausea and vomiting.  Musculoskeletal: Positive for back pain.  Neurological: Negative for headaches.    Vital Signs: BP 133/88 (BP Location: Right Arm)   Pulse 83   Temp 98.2 F (36.8 C) (Oral)   Resp 20   Wt 215 lb 13.3 oz (97.9 kg)   SpO2 96%   BMI 39.48 kg/m   Physical Exam Constitutional:      General: She is not in acute distress.    Appearance: She is not ill-appearing.  HENT:     Mouth/Throat:     Mouth: Mucous membranes are moist.     Pharynx: Oropharynx is clear.  Cardiovascular:     Rate and Rhythm: Normal rate and regular rhythm.     Pulses: Normal pulses.     Heart sounds: Normal heart sounds.     Comments: Right IJ temporary dialysis catheter. Dressing is clean and dry, site is unremarkable.  Pulmonary:     Effort: Pulmonary effort is normal.     Breath sounds: Normal breath sounds.  Abdominal:     General: Bowel sounds are normal.     Palpations: Abdomen is soft.  Skin:    General: Skin is warm and dry.  Neurological:     Mental Status: She is alert and oriented to person, place, and time.  Psychiatric:        Mood and Affect: Mood normal.         Behavior: Behavior normal.        Thought Content: Thought content normal.        Judgment: Judgment normal.     Imaging: CT ABDOMEN PELVIS WO CONTRAST  Result Date: 04/03/2020 CLINICAL DATA:  Abdominal pain. EXAM: CT ABDOMEN AND PELVIS WITHOUT CONTRAST TECHNIQUE: Multidetector CT imaging of the abdomen and pelvis was performed following the standard protocol without IV contrast. COMPARISON:  02/20/2019 FINDINGS: Lower chest: Patchy ground-glass infiltrates in the right lower lobe could be due to developing pneumonia, atypical pneumonia or acute aspiration. No pleural effusions. The heart is normal in size. No pericardial effusion. Three-vessel coronary artery calcifications are noted. Small hiatal hernia. Hepatobiliary: No hepatic lesions are identified without contrast. No intrahepatic biliary dilatation. The gallbladder is surgically absent. No common bile duct dilatation. Pancreas: Marked fatty atrophy of the pancreas. No mass, inflammation or ductal dilatation. Spleen: Normal size.  No focal lesions. Adrenals/Urinary Tract: The adrenal glands are unremarkable. Small, chronically atrophied kidneys consistent with chronic renal failure. No worrisome renal lesions or hydroureteronephrosis. The bladder is unremarkable. Stomach/Bowel: The stomach, duodenum, small bowel and colon are unremarkable. No acute inflammatory changes, mass lesions or obstructive findings. The terminal ileum is normal. Scattered colonic diverticulosis but no findings for acute diverticulitis. Vascular/Lymphatic: Significant diffuse vascular calcifications. No aneurysm. No mesenteric or retroperitoneal mass or adenopathy. No free air or free fluid. No leaking oral contrast. Reproductive: The uterus is surgically absent. Both ovaries are still present and appear normal. Other: Moderate-sized periumbilical abdominal wall hernia containing fat. Musculoskeletal: No significant bony findings. IMPRESSION: 1. No acute abdominal/pelvic  findings, mass lesions or adenopathy. 2. Patchy ground-glass infiltrates in the right lower lobe could be due to developing pneumonia, atypical pneumonia or acute aspiration. 3. Status post cholecystectomy. No biliary dilatation. 4. Small, chronically atrophied kidneys consistent with chronic renal failure. 5. Moderate-sized periumbilical abdominal wall hernia containing fat. 6. Aortic atherosclerosis. Aortic Atherosclerosis (ICD10-I70.0).  Electronically Signed   By: Marijo Sanes M.D.   On: 04/03/2020 19:19   CT HEAD WO CONTRAST  Result Date: 04/14/2020 CLINICAL DATA:  Delirium.  Altered mental status. EXAM: CT HEAD WITHOUT CONTRAST TECHNIQUE: Contiguous axial images were obtained from the base of the skull through the vertex without intravenous contrast. COMPARISON:  April 03, 2020. FINDINGS: Brain: No evidence of acute large vascular territory infarction, hemorrhage, hydrocephalus, extra-axial collection or mass lesion/mass effect. Similar cerebral atrophy with ex vacuo ventricular dilation. Mild white matter hypodensities, likely related to chronic microvascular ischemic disease. Vascular: Calcific atherosclerosis. Skull: No acute fracture. Sinuses/Orbits: Left maxillary sinus retention cyst versus polyp. Unremarkable orbits. Other: No mastoid effusions. IMPRESSION: 1. No evidence of acute intracranial abnormality. 2. Similar cerebral atrophy and ventriculomegaly. Electronically Signed   By: Margaretha Sheffield MD   On: 04/14/2020 16:08   CT Head Wo Contrast  Result Date: 04/03/2020 CLINICAL DATA:  Sudden onset of leg weakness today. EXAM: CT HEAD WITHOUT CONTRAST TECHNIQUE: Contiguous axial images were obtained from the base of the skull through the vertex without intravenous contrast. COMPARISON:  None. FINDINGS: Brain: Age advanced cerebral atrophy, ventriculomegaly and mild periventricular white matter disease. No extra-axial fluid collections are identified. No CT findings for acute hemispheric  infarction or intracranial hemorrhage. No mass lesions. The brainstem and cerebellum are normal. Vascular: Moderate to advanced vascular calcifications but no aneurysm or hyperdense vessels. Skull: No skull fracture or bone lesions. Sinuses/Orbits: The paranasal sinuses and mastoid air cells are clear except for a 2 cm mucous retention cyst or polyp in the left maxillary sinus. The globes are intact. Other: No scalp lesions or scalp hematoma. IMPRESSION: 1. Age advanced cerebral atrophy, ventriculomegaly and mild periventricular white matter disease. 2. No acute intracranial findings or mass lesions. Electronically Signed   By: Marijo Sanes M.D.   On: 04/03/2020 19:14   US RENAL  Result Date: 04/04/2020 CLINICAL DATA:  Acute kidney injury EXAM: RENAL / URINARY TRACT ULTRASOUND COMPLETE COMPARISON:  CT 04/03/2020 FINDINGS: Technical note: Significantly limited examination secondary to poor penetration related to Ann body habitus and shadowing from overlying bowel gas. Right Kidney: Limited. Echogenic renal parenchyma with cortical thinning compatible with chronic medical renal disease. Right renal length of approximately 10.6 cm. No shadowing stone or hydronephrosis is identified. Left Kidney: Very limited evaluation. Visualized left kidney appears diffusely echogenic with renal cortical thinning compatible with chronic medical renal disease. No obvious hydronephrosis. Bladder: Appears normal for degree of bladder distention. Other: None. IMPRESSION: 1. Limited exam. 2. No sonographic evidence of obstructive uropathy. 3. Atrophic, echogenic kidneys compatible with chronic medical renal disease. Electronically Signed   By: Davina Poke D.O.   On: 04/04/2020 12:18   DG CHEST PORT 1 VIEW  Result Date: 04/11/2020 CLINICAL DATA:  Hemodialysis catheter placement EXAM: PORTABLE CHEST 1 VIEW COMPARISON:  April 11, 2020 FINDINGS: The heart size is enlarged. Bilateral airspace disease is again noted and  has progressed in the right mid and right upper lung zones. There is vascular congestion with small bilateral pleural effusions. There is a right-sided central venous catheter with tip projecting over the mid SVC. The catheter fall short of the cavoatrial junction by approximately 6-7 cm. There is no evidence for pneumothorax. Aortic calcifications are noted. The main pulmonary artery is dilated. IMPRESSION: 1. Interval worsening of bilateral airspace disease, greatest in the right mid and right upper lung zones. 2. Hemodialysis catheter as above. While the tip likely terminates in the SVC, a catheter  fall short of the cavoatrial junction by at least 6 cm. 3. Persistent cardiomegaly with vascular congestion and small bilateral pleural effusions. Electronically Signed   By: Constance Holster M.D.   On: 04/11/2020 17:23   DG Chest Port 1 View  Result Date: 04/11/2020 CLINICAL DATA:  Shortness of breath. Cough. Hypoxia. COVID-19 virus infection. EXAM: PORTABLE CHEST 1 VIEW COMPARISON:  04/03/2020 FINDINGS: Heart size remains normal. New mild airspace opacity is seen in the central right upper lobe and peripheral left upper lobe. No evidence of pleural effusion. IMPRESSION: New mild bilateral upper lobe airspace disease, suspicious for viral pneumonia. Electronically Signed   By: Marlaine Hind M.D.   On: 04/11/2020 11:30   DG Chest Port 1 View  Result Date: 04/03/2020 CLINICAL DATA:  Right lower lobe infiltrate EXAM: PORTABLE CHEST 1 VIEW COMPARISON:  Same day CT abdomen pelvis, 04/03/2020, chest radiographs, 04/05/2014 FINDINGS: The heart size and mediastinal contours are within normal limits. Subtle heterogeneous airspace opacity of the right lung base. The visualized skeletal structures are unremarkable. IMPRESSION: Subtle heterogeneous airspace opacity of the right lung base, in keeping with findings of prior CT and consistent with infection or aspiration. Electronically Signed   By: Eddie Candle M.D.    On: 04/03/2020 20:23   ECHOCARDIOGRAM COMPLETE  Result Date: 04/04/2020    ECHOCARDIOGRAM REPORT   Ann Name:   [place] EVELYN [name] Date of Exam: 04/04/2020 Medical Rec #:  [id number]     Height:       62.0 in Accession #:    [id number]    Weight:       211.2 lb Date of Birth:  1950/05/21     BSA:          1.956 m Ann Age:    21 years      BP:           146/78 mmHg Ann Gender: F             HR:           82 bpm. Exam Location:  Inpatient Procedure: 2D Echo, Cardiac Doppler, Color Doppler and Intracardiac            Opacification Agent Indications:    Abnormal ECG 794.31 / R94.31  History:        Ann has prior history of Echocardiogram examinations, most                 recent 11/11/1996. CAD, Signs/Symptoms:Shortness of Breath and                 Chest Pain; Risk Factors:Hypertension, Diabetes and                 Dyslipidemia.  Sonographer:    Bernadene Person RDCS Referring Phys: [id number] [place]  1. Left ventricular ejection fraction, by estimation, is 60 to 65%. The left ventricle has normal function. The left ventricle has no regional wall motion abnormalities. Left ventricular diastolic parameters are consistent with Grade II diastolic dysfunction (pseudonormalization). Elevated left ventricular end-diastolic pressure.  2. Right ventricular systolic function is normal. The right ventricular size is normal. There is normal pulmonary artery systolic pressure.  3. The mitral valve is degenerative. Trivial mitral valve regurgitation. No evidence of mitral stenosis. Moderate mitral annular calcification.  4. The aortic valve is tricuspid. Aortic valve regurgitation is not visualized. Mild to moderate aortic valve sclerosis/calcification is present, without any evidence of aortic stenosis.  5. The inferior vena cava  is normal in size with greater than 50% respiratory variability, suggesting right atrial pressure of 3 mmHg. FINDINGS  Left Ventricle: Left ventricular ejection fraction,  by estimation, is 60 to 65%. The left ventricle has normal function. The left ventricle has no regional wall motion abnormalities. Definity contrast agent was given IV to delineate the left ventricular  endocardial borders. The left ventricular internal cavity size was normal in size. There is no left ventricular hypertrophy. Left ventricular diastolic parameters are consistent with Grade II diastolic dysfunction (pseudonormalization). Elevated left ventricular end-diastolic pressure. Right Ventricle: The right ventricular size is normal. No increase in right ventricular wall thickness. Right ventricular systolic function is normal. There is normal pulmonary artery systolic pressure. The tricuspid regurgitant velocity is 2.75 m/s, and  with an assumed right atrial pressure of 3 mmHg, the estimated right ventricular systolic pressure is 17.4 mmHg. Left Atrium: Left atrial size was normal in size. Right Atrium: Right atrial size was normal in size. Pericardium: There is no evidence of pericardial effusion. Mitral Valve: The mitral valve is degenerative in appearance. There is moderate thickening of the mitral valve leaflet(s). There is moderate calcification of the mitral valve leaflet(s). Moderate mitral annular calcification. Trivial mitral valve regurgitation. No evidence of mitral valve stenosis. Tricuspid Valve: The tricuspid valve is normal in structure. Tricuspid valve regurgitation is mild . No evidence of tricuspid stenosis. Aortic Valve: The aortic valve is tricuspid. Aortic valve regurgitation is not visualized. Mild to moderate aortic valve sclerosis/calcification is present, without any evidence of aortic stenosis. Pulmonic Valve: The pulmonic valve was normal in structure. Pulmonic valve regurgitation is not visualized. No evidence of pulmonic stenosis. Aorta: The aortic root is normal in size and structure. Venous: The inferior vena cava is normal in size with greater than 50% respiratory variability,  suggesting right atrial pressure of 3 mmHg. IAS/Shunts: No atrial level shunt detected by color flow Doppler.  LEFT VENTRICLE PLAX 2D LVIDd:         5.80 cm  Diastology LVIDs:         4.10 cm  LV e' medial:    3.81 cm/s LV PW:         0.70 cm  LV E/e' medial:  24.3 LV IVS:        1.00 cm  LV e' lateral:   4.57 cm/s LVOT diam:     2.00 cm  LV E/e' lateral: 20.2 LV SV:         78 LV SV Index:   40 LVOT Area:     3.14 cm  RIGHT VENTRICLE RV S prime:     13.10 cm/s TAPSE (M-mode): 2.0 cm LEFT ATRIUM             Index       RIGHT ATRIUM           Index LA diam:        3.40 cm 1.74 cm/m  RA Area:     18.10 cm LA Vol (A2C):   56.0 ml 28.62 ml/m RA Volume:   47.40 ml  24.23 ml/m LA Vol (A4C):   51.1 ml 26.12 ml/m LA Biplane Vol: 53.4 ml 27.29 ml/m  AORTIC VALVE LVOT Vmax:   104.00 cm/s LVOT Vmean:  86.300 cm/s LVOT VTI:    0.249 m  AORTA Ao Root diam: 3.30 cm Ao Asc diam:  3.60 cm MITRAL VALVE                TRICUSPID VALVE MV Area (  PHT): 2.48 cm     TR Peak grad:   30.2 mmHg MV Decel Time: 306 msec     TR Vmax:        275.00 cm/s MV E velocity: 92.40 cm/s MV A velocity: 138.00 cm/s  SHUNTS MV E/A ratio:  0.67         Systemic VTI:  0.25 m                             Systemic Diam: 2.00 cm Jenkins Rouge MD Electronically signed by Jenkins Rouge MD Signature Date/Time: 04/04/2020/11:23:04 AM    Final    VAS [place] LOWER EXTREMITY VENOUS (DVT)  Result Date: 04/13/2020  Lower Venous DVT Study Indications: Covid +, elevated D dimer.  Limitations: Body habitus, cooperation and poor ultrasound/tissue interface. Performing Technologist: Antonieta Pert RDMS, RVT  Examination Guidelines: A complete evaluation includes B-mode imaging, spectral Doppler, color Doppler, and power Doppler as needed of all accessible portions of each vessel. Bilateral testing is considered an integral part of a complete examination. Limited examinations for reoccurring indications may be performed as noted. The reflux portion of the exam is  performed with the Ann in reverse Trendelenburg.  +---------+---------------+---------+-----------+----------+--------------+ RIGHT    CompressibilityPhasicitySpontaneityPropertiesThrombus Aging +---------+---------------+---------+-----------+----------+--------------+ CFV      Full           Yes      Yes                                 +---------+---------------+---------+-----------+----------+--------------+ SFJ      Full                                                        +---------+---------------+---------+-----------+----------+--------------+ FV Prox  Full                                                        +---------+---------------+---------+-----------+----------+--------------+ FV Mid   Full                                                        +---------+---------------+---------+-----------+----------+--------------+ FV DistalFull                                                        +---------+---------------+---------+-----------+----------+--------------+ PFV      Full                                                        +---------+---------------+---------+-----------+----------+--------------+ POP      Full  Yes      Yes                                 +---------+---------------+---------+-----------+----------+--------------+ PTV      Full                                                        +---------+---------------+---------+-----------+----------+--------------+ PERO     Partial                            dilated   Acute          +---------+---------------+---------+-----------+----------+--------------+ GSV      Full                                                        +---------+---------------+---------+-----------+----------+--------------+   +---------+---------------+---------+-----------+----------+--------------+ LEFT     CompressibilityPhasicitySpontaneityPropertiesThrombus  Aging +---------+---------------+---------+-----------+----------+--------------+ CFV      Full           Yes      Yes                                 +---------+---------------+---------+-----------+----------+--------------+ SFJ      Full                                                        +---------+---------------+---------+-----------+----------+--------------+ FV Prox  Full                                                        +---------+---------------+---------+-----------+----------+--------------+ FV Mid   Full                                                        +---------+---------------+---------+-----------+----------+--------------+ FV DistalFull                                                        +---------+---------------+---------+-----------+----------+--------------+ PFV      Full                                                        +---------+---------------+---------+-----------+----------+--------------+ POP      Full  Yes      Yes                                 +---------+---------------+---------+-----------+----------+--------------+ PTV      Full                                                        +---------+---------------+---------+-----------+----------+--------------+ PERO     Full                                                        +---------+---------------+---------+-----------+----------+--------------+ GSV      Full                                                        +---------+---------------+---------+-----------+----------+--------------+     Summary: RIGHT: - Findings consistent with acute deep vein thrombosis involving the right peroneal veins. - No cystic structure found in the popliteal fossa. - Small segment of thrombus in the right calf.  LEFT: - There is no evidence of deep vein thrombosis in the lower extremity. However, portions of this examination were limited- see  technologist comments above.  - No cystic structure found in the popliteal fossa. - Difficult exam bilaterally due to large body habitus, poor penetration and poor Ann cooperation.  *See table(s) above for measurements and observations. Electronically signed by Harold Barban MD on 04/13/2020 at 6:07:50 PM.    Final     Labs:  CBC: Recent Labs    04/16/20 0214 04/18/20 0432 04/19/20 0259 04/20/20 0400  WBC 7.7 5.4 4.0 4.0  HGB 10.6* 9.4* 8.7* 8.8*  HCT 31.9* 28.8* 27.7* 28.0*  PLT 204 124* 115* 105*    COAGS: No results for input(s): INR, APTT in the last 8760 hours.  BMP: Recent Labs    04/16/20 0214 04/18/20 0432 04/19/20 0259 04/20/20 0400  NA 141 138 139 139  K 3.6 3.3* 3.2* 3.2*  CL 104 101 102 104  CO2 25 27 26 26   GLUCOSE 192* 212* 194* 104*  BUN 53* 33* 35* 38*  CALCIUM 8.2* 8.1* 8.1* 8.2*  CREATININE 4.22* 3.71* 4.42* 4.67*  GFRNONAA 11* 13* 10* 10*    LIVER FUNCTION TESTS: Recent Labs    04/14/20 0145 04/15/20 0624 04/16/20 0214 04/18/20 0432 04/19/20 0259 04/20/20 0400  BILITOT 0.6 0.9 0.6 0.5  --   --   AST 21 25 24 22   --   --   ALT 22 22 23 21   --   --   ALKPHOS 70 60 57 60  --   --   PROT 5.7* 5.4* 5.1* 4.6*  --   --   ALBUMIN 2.5* 2.5* 2.4* 2.4* 2.2* 2.3*    TUMOR MARKERS: No results for input(s): AFPTM, CEA, CA199, CHROMGRNA in the last 8760 hours.  Assessment and Plan:  ESRD requiring hemodialysis: Ann [name], 69 year old female, is tentatively scheduled for a tunneled dialysis catheter placement 04/20/20  at the [facility].   Risks and benefits discussed with the Ann including, but not limited to bleeding, infection, vascular injury, pneumothorax which may require chest tube placement, air embolism or even death  All of the Ann's questions were answered, Ann is agreeable to proceed.  Consent signed and in the IR control room. She has been NPO today. Labs and vitals have been  reviewed. Her last dose of Eliquis was 04/19/20 at 0847.  Thank you for this interesting consult.  I greatly enjoyed meeting Ann [name] and look forward to participating in their care.  A copy of this report was sent to the requesting provider on this date.  Electronically Signed: Soyla Dryer, AGACNP-BC [phone] 04/20/2020, 11:51 AM   I spent a total of 20 Minutes    in face to face in clinical consultation, greater than 50% of which was counseling/coordinating care for tunneled dialysis catheter placement.

## 2020-04-20 NOTE — Progress Notes (Signed)
PROGRESS NOTE                                                                             PROGRESS NOTE                                                                                                                                                                                                             Patient Demographics:    Ann [name], is a 69 y.o. female, DOB - [birth year], PJK:[id number]  Outpatient Primary MD for the patient is [name], MD    LOS - 9  Admit date - 04/11/2020    Chief Complaint  Patient presents with  . Covid Positive       Brief Narrative    HPI: Ann [name] is a 69 y.o. female with history h/o stage IV CKD with associated anemia, CAD, T2DM, HTN, HLD, GERD, asthma, OSA on CPAP, chronic lumbar back pain who was recently admitted 12/11-12/17/21 to [facility] for COVID-19 related GI/resp symptoms treated with Remdesevir ( steroids not given as no hypoxia throughout the stay) presents to ED today with worsening symptoms. In the ED 12/11, she was felt to be dehydrated in addition to CKD baseline creatinine 2.7-3.3, creatinine was 5.3 on admission,did peak at 6.5,  plateaued around 6.2 range for 3 days prior to discharge. Diuretics and ACEiwere held on discharge and nephrology follow up arranged. She was deconditioned at discharge but declined SNF , went [facility] with [facility].  Per daughter, patient has been eating poorly and sleeping most of the time since last hospital discharge.  She also appeared to be having labored breathing and unable to perform any of her ADLs.  Daughter asking if staff here can help her take a bath as she has been unable to get her to the bathroom.  Patient currently oriented to self/person only but not to place or time.  She is unable to provide any details regarding hospital visit but does report productive cough and shortness of breath.  ED findings today: Afebrile, SBP  1 20-160, RR 15-19, hypoxic on arrival  requiring NRB M transiently-transitioned to 3 L O2 now.  Lab work shows worsening renal function with BUN 86->103, creatinine 6.26->8.99, bicarb 14, potassium 4.6, albumin 2.6->2.3.  CRP 20.6, procalcitonin 2.68, lactate 1.7.  Ferritin 1320, LDH 281, triglyceride 134, D-dimer 2.6->6.0 WBC 10.3, hemoglobin 9.0, platelet 321.  Chest x-ray shows "new mild bilateral upper lobe airspace disease, suspicious for viral pneumonia."  She received 10 mg IV Decadron x1 and on IV fluids NS at 100 mL/h.  Per EDP, nephrology has been consulted and patient requested to be admitted for further evaluation and management.    Subjective:    Anwar Mckibbin today denies any complaints, no chest pain, no shortness of breath, no cough.     Assessment  & Plan :    Principal Problem:   Acute respiratory failure with hypoxia (HCC) Active Problems:   HYPERTENSION   CAD (coronary artery disease)   Obesity, Class III, BMI 40-49.9 (morbid obesity) ([place])   Acute encephalopathy   Chronic pain   2019 novel coronavirus disease (COVID-19)   GERD (gastroesophageal reflux disease)   AKI (acute kidney injury) ([place])  Acute Hypoxic Resp. Failure due to Acute Covid 19 Viral Pneumonitis during the ongoing 2020 Covid 19 Pandemic -  -She is unvaccinated -He was treated with steroids and Remdesivir during recent hospitalization, was started on steroids, no hypoxia, steroid has been stopped -Received Actemra on admission. -Was encouraged to use incentive spirometry and flutter valve. - she is currently on room air.  Acute DVT -Elevated d D-dimers, venous Doppler was obtained she is having right peroneal veins DVT, given her Covid infection, and hypercoagulable state, high risk for VTE, continue with full anticoagulation with Eliquis.  Progressive AKI on CKD stage IV:  -Management per renal, started on hemodialysis this hospital stay, oliguric, creatinine/BUN trending up on off hemodialysis , she is ESRD, . -We will need long-term  hemodialysis, IR consulted to convert tunneled hemodialysis catheter placement . -will hold Eliquis this evening and tomorrow morning in anticipation for IR procedure.  Acute metabolic encephalopathy: - Likely related to significant uremia (greater than 100) and COVID-19 infection.  Patient has been improving gradually, CT head with no acute findings.   -Has resolved after few hemodialysis sessions. -Patient back at baseline, she is awake alert x3, appropriate and coherent.  Diabetes mellitus type 2:  -CBG currently acceptable on Levemir and sliding scale   Anemia -The setting of chronic kidney disease, she has 1 unit PRBC 12/20 with good response.  CAD: Resume home medications, aspirin, beta-blockers and statins.  Hypertension:  -Resume home medications, creased home dose Norvasc to 5 mg and titrate as needed.  Diuretics/ACE inhibitors held in last admission  Chronic pain: Hold Neurontin/Cymbalta for now until mental status improves.  GERD: Resume PPI unless otherwise advised by nephrology.  Obesity class III BMI 40-49.9: Follow-up PCP, weight modifying recommendations upon discharge    SpO2: 96 % O2 Flow Rate (L/min): 0 L/min  Recent Labs  Lab 04/14/20 0145 04/15/20 0624 04/16/20 0214 04/18/20 0432 04/19/20 0259 04/20/20 0400  WBC 9.7 10.4 7.7 5.4 4.0 4.0  PLT 275 256 204 124* 115* 105*  DDIMER 3.85*  --   --   --   --   --   AST 21 25 24 22   --   --   ALT 22 22 23 21   --   --   ALKPHOS 70 60 57 60  --   --  BILITOT 0.6 0.9 0.6 0.5  --   --   ALBUMIN 2.5* 2.5* 2.4* 2.4* 2.2* 2.3*       ABG     Component Value Date/Time   TCO2 20 09/27/2008 1636       Condition - Extremely Guarded  Family Communication  : Discussed with daughter Ann [name] by phone 16/60/6301, 12/22, left voicemail 12/23  updated 12/24,12/26.    Code Status :  Full  Consults  :  Renal, PCCM,IR  Procedures  : A hemodialysis catheter placement by PCCM 12/19, 1 unit PRBC  transfusion on 12/20  Disposition Plan  :    Status is: Inpatient  Remains inpatient appropriate because:Hemodynamically unstable   Dispo: The patient is from: Home              Anticipated d/c is to: SNF              Anticipated d/c date is: > 3 days              Patient currently is not medically stable to d/c.  Will need tunneled hemodialysis catheter, she needs CLIP for outpatient HD, and SNF bed available     DVT Prophylaxis  :  Heparin>>Eliquis  Lab Results  Component Value Date   PLT 105 (L) 04/20/2020    Diet :  Diet Order            Diet NPO time specified Except for: Sips with Meds  Diet effective midnight           Diet renal/carb modified with fluid restriction Fluid restriction: 1200 mL Fluid; Room service appropriate? Yes; Fluid consistency: Thin  Diet effective now                  Inpatient Medications  Scheduled Meds: . sodium chloride   Intravenous Once  . amLODipine  5 mg Oral Daily  . apixaban  5 mg Oral BID  . vitamin C  500 mg Oral Daily  . Chlorhexidine Gluconate Cloth  6 each Topical Q0600  . Chlorhexidine Gluconate Cloth  6 each Topical Q0600  . Chlorhexidine Gluconate Cloth  6 each Topical Q0600  . darbepoetin (ARANESP) injection - DIALYSIS  60 mcg Intravenous Q Fri-HD  . hydrALAZINE  50 mg Oral TID  . insulin aspart  0-15 Units Subcutaneous TID WC  . insulin aspart  0-5 Units Subcutaneous QHS  . insulin detemir  10 Units Subcutaneous Daily  . Ipratropium-Albuterol  1 puff Inhalation BID  . loratadine  10 mg Oral Daily  . metoprolol tartrate  25 mg Oral BID  . nystatin   Topical BID  . pantoprazole  20 mg Oral Daily  . pravastatin  40 mg Oral QPM  . rOPINIRole  0.25 mg Oral QPM   Continuous Infusions: .  ceFAZolin (ANCEF) IV     PRN Meds:.albuterol, bisacodyl, guaiFENesin-dextromethorphan, lip balm, ondansetron **OR** ondansetron (ZOFRAN) IV, sodium chloride flush  Antibiotics  :    Anti-infectives (From admission, onward)    Start     Dose/Rate Route Frequency Ordered Stop   04/20/20 1500  ceFAZolin (ANCEF) IVPB 2g/100 mL premix        2 g 200 mL/hr over 30 Minutes Intravenous To Radiology 04/20/20 1358 04/21/20 1500   04/11/20 1300  cefTRIAXone (ROCEPHIN) 1 g in sodium chloride 0.9 % 100 mL IVPB  Status:  Discontinued        1 g 200 mL/hr over 30 Minutes Intravenous Every 24 hours 04/11/20  1252 04/13/20 1418   04/11/20 1300  doxycycline (VIBRA-TABS) tablet 100 mg  Status:  Discontinued        100 mg Oral Every 12 hours 04/11/20 1252 04/13/20 1418        Delylah Stanczyk M.D on 04/20/2020 at 3:01 PM  To page go to [email or web address]  [facility] -  Office  [phone]       Objective:   Vitals:   04/20/20 1000 04/20/20 1030 04/20/20 1054 04/20/20 1137  BP: 122/79 133/82 (!) 142/62 133/88  Pulse:    83  Resp:   14 20  Temp:   (!) 97.3 F (36.3 C) 98.2 F (36.8 C)  TempSrc:   Oral Oral  SpO2:   95% 96%  Weight:   97.9 kg     Wt Readings from Last 3 Encounters:  04/20/20 97.9 kg  04/09/20 99 kg  02/25/19 115 kg     Intake/Output Summary (Last 24 hours) at 04/20/2020 1501 Last data filed at 04/20/2020 1448 Gross per 24 hour  Intake 360 ml  Output 1300 ml  Net -940 ml     Physical Exam Awake Alert, Oriented X 3, No new F.N deficits, Normal affect Symmetrical Chest wall movement, Good air movement bilaterally, CTAB RRR,No Gallops,Rubs or new Murmurs, No Parasternal Heave +ve B.Sounds, Abd Soft, No tenderness, No rebound - guarding or rigidity. No Cyanosis, Clubbing or edema, No new Rash or bruise       Data Review:    CBC Recent Labs  Lab 04/14/20 0145 04/15/20 0624 04/16/20 0214 04/18/20 0432 04/19/20 0259 04/20/20 0400  WBC 9.7 10.4 7.7 5.4 4.0 4.0  HGB 10.7* 10.5* 10.6* 9.4* 8.7* 8.8*  HCT 32.5* 30.3* 31.9* 28.8* 27.7* 28.0*  PLT 275 256 204 124* 115* 105*  MCV 88.6 84.4 85.1 88.6 89.9 90.9  MCH 29.2 29.2 28.3 28.9 28.2 28.6  MCHC 32.9 34.7 33.2 32.6 31.4  31.4  RDW 15.8* 15.3 14.8 15.4 15.8* 15.8*  LYMPHSABS 0.4* 0.7 1.0  --   --   --   MONOABS 0.3 0.5 0.5  --   --   --   EOSABS 0.0 0.0 0.0  --   --   --   BASOSABS 0.0 0.0 0.0  --   --   --     Recent Labs  Lab 04/14/20 0145 04/15/20 0624 04/16/20 0214 04/18/20 0432 04/19/20 0259 04/20/20 0400  NA 140 139 141 138 139 139  K 4.4 3.9 3.6 3.3* 3.2* 3.2*  CL 105 103 104 101 102 104  CO2 19* 24 25 27 26 26   GLUCOSE 397* 175* 192* 212* 194* 104*  BUN 91* 49* 53* 33* 35* 38*  CREATININE 6.78* 3.89* 4.22* 3.71* 4.42* 4.67*  CALCIUM 8.4* 8.0* 8.2* 8.1* 8.1* 8.2*  AST 21 25 24 22   --   --   ALT 22 22 23 21   --   --   ALKPHOS 70 60 57 60  --   --   BILITOT 0.6 0.9 0.6 0.5  --   --   ALBUMIN 2.5* 2.5* 2.4* 2.4* 2.2* 2.3*  DDIMER 3.85*  --   --   --   --   --     ------------------------------------------------------------------------------------------------------------------ No results for input(s): CHOL, HDL, LDLCALC, TRIG, CHOLHDL, LDLDIRECT in the last 72 hours.  Lab Results  Component Value Date   HGBA1C 6.6 (H) 04/04/2020   ------------------------------------------------------------------------------------------------------------------ No results for input(s): TSH, T4TOTAL, T3FREE, THYROIDAB in the last 72 hours.  Invalid input(s): FREET3  Cardiac Enzymes No results for input(s): CKMB, TROPONINI, MYOGLOBIN in the last 168 hours.  Invalid input(s): CK ------------------------------------------------------------------------------------------------------------------ No results found for: BNP  Micro Results Recent Results (from the past 240 hour(s))  Blood Culture (routine x 2)     Status: None   Collection Time: 04/11/20 10:18 AM   Specimen: BLOOD  Result Value Ref Range Status   Specimen Description BLOOD RIGHT ANTECUBITAL  Final   Special Requests   Final    BOTTLES DRAWN AEROBIC ONLY Blood Culture adequate volume   Culture   Final    NO GROWTH 5 DAYS Performed  at [facility], [address] [place], [place] [address]    Report Status 04/16/2020 FINAL  Final  Blood Culture (routine x 2)     Status: None   Collection Time: 04/11/20 10:23 AM   Specimen: BLOOD  Result Value Ref Range Status   Specimen Description BLOOD LEFT ANTECUBITAL  Final   Special Requests   Final    BOTTLES DRAWN AEROBIC AND ANAEROBIC Blood Culture adequate volume   Culture   Final    NO GROWTH 5 DAYS Performed at [facility], [place] [address] [place], [place] [address]    Report Status 04/16/2020 FINAL  Final  MRSA PCR Screening     Status: None   Collection Time: 04/11/20 12:56 PM   Specimen: Nasal Mucosa; Nasopharyngeal  Result Value Ref Range Status   MRSA by PCR NEGATIVE NEGATIVE Final    Comment:        The GeneXpert MRSA Assay (FDA approved for NASAL specimens only), is one component of a comprehensive MRSA colonization surveillance program. It is not intended to diagnose MRSA infection nor to guide or monitor treatment for MRSA infections. Performed at [facility], [place] [address], [place], [place] [address]     Radiology Reports CT ABDOMEN PELVIS WO CONTRAST  Result Date: 04/03/2020 CLINICAL DATA:  Abdominal pain. EXAM: CT ABDOMEN AND PELVIS WITHOUT CONTRAST TECHNIQUE: Multidetector CT imaging of the abdomen and pelvis was performed following the standard protocol without IV contrast. COMPARISON:  02/20/2019 FINDINGS: Lower chest: Patchy ground-glass infiltrates in the right lower lobe could be due to developing pneumonia, atypical pneumonia or acute aspiration. No pleural effusions. The heart is normal in size. No pericardial effusion. Three-vessel coronary artery calcifications are noted. Small hiatal hernia. Hepatobiliary: No hepatic lesions are identified without contrast. No intrahepatic biliary dilatation. The gallbladder is surgically absent. No common bile duct dilatation. Pancreas: Marked fatty atrophy of the pancreas. No mass,  inflammation or ductal dilatation. Spleen: Normal size.  No focal lesions. Adrenals/Urinary Tract: The adrenal glands are unremarkable. Small, chronically atrophied kidneys consistent with chronic renal failure. No worrisome renal lesions or hydroureteronephrosis. The bladder is unremarkable. Stomach/Bowel: The stomach, duodenum, small bowel and colon are unremarkable. No acute inflammatory changes, mass lesions or obstructive findings. The terminal ileum is normal. Scattered colonic diverticulosis but no findings for acute diverticulitis. Vascular/Lymphatic: Significant diffuse vascular calcifications. No aneurysm. No mesenteric or retroperitoneal mass or adenopathy. No free air or free fluid. No leaking oral contrast. Reproductive: The uterus is surgically absent. Both ovaries are still present and appear normal. Other: Moderate-sized periumbilical abdominal wall hernia containing fat. Musculoskeletal: No significant bony findings. IMPRESSION: 1. No acute abdominal/pelvic findings, mass lesions or adenopathy. 2. Patchy ground-glass infiltrates in the right lower lobe could be due to developing pneumonia, atypical pneumonia or acute aspiration. 3. Status post cholecystectomy. No biliary  dilatation. 4. Small, chronically atrophied kidneys consistent with chronic renal failure. 5. Moderate-sized periumbilical abdominal wall hernia containing fat. 6. Aortic atherosclerosis. Aortic Atherosclerosis (ICD10-I70.0). Electronically Signed   By: Marijo Sanes M.D.   On: 04/03/2020 19:19   CT HEAD WO CONTRAST  Result Date: 04/14/2020 CLINICAL DATA:  Delirium.  Altered mental status. EXAM: CT HEAD WITHOUT CONTRAST TECHNIQUE: Contiguous axial images were obtained from the base of the skull through the vertex without intravenous contrast. COMPARISON:  April 03, 2020. FINDINGS: Brain: No evidence of acute large vascular territory infarction, hemorrhage, hydrocephalus, extra-axial collection or mass lesion/mass effect.  Similar cerebral atrophy with ex vacuo ventricular dilation. Mild white matter hypodensities, likely related to chronic microvascular ischemic disease. Vascular: Calcific atherosclerosis. Skull: No acute fracture. Sinuses/Orbits: Left maxillary sinus retention cyst versus polyp. Unremarkable orbits. Other: No mastoid effusions. IMPRESSION: 1. No evidence of acute intracranial abnormality. 2. Similar cerebral atrophy and ventriculomegaly. Electronically Signed   By: Margaretha Sheffield MD   On: 04/14/2020 16:08   CT Head Wo Contrast  Result Date: 04/03/2020 CLINICAL DATA:  Sudden onset of leg weakness today. EXAM: CT HEAD WITHOUT CONTRAST TECHNIQUE: Contiguous axial images were obtained from the base of the skull through the vertex without intravenous contrast. COMPARISON:  None. FINDINGS: Brain: Age advanced cerebral atrophy, ventriculomegaly and mild periventricular white matter disease. No extra-axial fluid collections are identified. No CT findings for acute hemispheric infarction or intracranial hemorrhage. No mass lesions. The brainstem and cerebellum are normal. Vascular: Moderate to advanced vascular calcifications but no aneurysm or hyperdense vessels. Skull: No skull fracture or bone lesions. Sinuses/Orbits: The paranasal sinuses and mastoid air cells are clear except for a 2 cm mucous retention cyst or polyp in the left maxillary sinus. The globes are intact. Other: No scalp lesions or scalp hematoma. IMPRESSION: 1. Age advanced cerebral atrophy, ventriculomegaly and mild periventricular white matter disease. 2. No acute intracranial findings or mass lesions. Electronically Signed   By: Marijo Sanes M.D.   On: 04/03/2020 19:14   US RENAL  Result Date: 04/04/2020 CLINICAL DATA:  Acute kidney injury EXAM: RENAL / URINARY TRACT ULTRASOUND COMPLETE COMPARISON:  CT 04/03/2020 FINDINGS: Technical note: Significantly limited examination secondary to poor penetration related to patient body habitus and  shadowing from overlying bowel gas. Right Kidney: Limited. Echogenic renal parenchyma with cortical thinning compatible with chronic medical renal disease. Right renal length of approximately 10.6 cm. No shadowing stone or hydronephrosis is identified. Left Kidney: Very limited evaluation. Visualized left kidney appears diffusely echogenic with renal cortical thinning compatible with chronic medical renal disease. No obvious hydronephrosis. Bladder: Appears normal for degree of bladder distention. Other: None. IMPRESSION: 1. Limited exam. 2. No sonographic evidence of obstructive uropathy. 3. Atrophic, echogenic kidneys compatible with chronic medical renal disease. Electronically Signed   By: Davina Poke D.O.   On: 04/04/2020 12:18   DG CHEST PORT 1 VIEW  Result Date: 04/11/2020 CLINICAL DATA:  Hemodialysis catheter placement EXAM: PORTABLE CHEST 1 VIEW COMPARISON:  April 11, 2020 FINDINGS: The heart size is enlarged. Bilateral airspace disease is again noted and has progressed in the right mid and right upper lung zones. There is vascular congestion with small bilateral pleural effusions. There is a right-sided central venous catheter with tip projecting over the mid SVC. The catheter fall short of the cavoatrial junction by approximately 6-7 cm. There is no evidence for pneumothorax. Aortic calcifications are noted. The main pulmonary artery is dilated. IMPRESSION: 1. Interval worsening of bilateral airspace disease,  greatest in the right mid and right upper lung zones. 2. Hemodialysis catheter as above. While the tip likely terminates in the SVC, a catheter fall short of the cavoatrial junction by at least 6 cm. 3. Persistent cardiomegaly with vascular congestion and small bilateral pleural effusions. Electronically Signed   By: Constance Holster M.D.   On: 04/11/2020 17:23   DG Chest Port 1 View  Result Date: 04/11/2020 CLINICAL DATA:  Shortness of breath. Cough. Hypoxia. COVID-19 virus  infection. EXAM: PORTABLE CHEST 1 VIEW COMPARISON:  04/03/2020 FINDINGS: Heart size remains normal. New mild airspace opacity is seen in the central right upper lobe and peripheral left upper lobe. No evidence of pleural effusion. IMPRESSION: New mild bilateral upper lobe airspace disease, suspicious for viral pneumonia. Electronically Signed   By: Marlaine Hind M.D.   On: 04/11/2020 11:30   DG Chest Port 1 View  Result Date: 04/03/2020 CLINICAL DATA:  Right lower lobe infiltrate EXAM: PORTABLE CHEST 1 VIEW COMPARISON:  Same day CT abdomen pelvis, 04/03/2020, chest radiographs, 04/05/2014 FINDINGS: The heart size and mediastinal contours are within normal limits. Subtle heterogeneous airspace opacity of the right lung base. The visualized skeletal structures are unremarkable. IMPRESSION: Subtle heterogeneous airspace opacity of the right lung base, in keeping with findings of prior CT and consistent with infection or aspiration. Electronically Signed   By: Eddie Candle M.D.   On: 04/03/2020 20:23   ECHOCARDIOGRAM COMPLETE  Result Date: 04/04/2020    ECHOCARDIOGRAM REPORT   Patient Name:   LAROSE BATRES Hokenson Date of Exam: 04/04/2020 Medical Rec #:  [id number]     Height:       62.0 in Accession #:    [id number]    Weight:       211.2 lb Date of Birth:  1950/08/02     BSA:          1.956 m Patient Age:    41 years      BP:           146/78 mmHg Patient Gender: F             HR:           82 bpm. Exam Location:  Inpatient Procedure: 2D Echo, Cardiac Doppler, Color Doppler and Intracardiac            Opacification Agent Indications:    Abnormal ECG 794.31 / R94.31  History:        Patient has prior history of Echocardiogram examinations, most                 recent 11/11/1996. CAD, Signs/Symptoms:Shortness of Breath and                 Chest Pain; Risk Factors:Hypertension, Diabetes and                 Dyslipidemia.  Sonographer:    Bernadene Person RDCS Referring Phys: [id number] [place]  1. Left  ventricular ejection fraction, by estimation, is 60 to 65%. The left ventricle has normal function. The left ventricle has no regional wall motion abnormalities. Left ventricular diastolic parameters are consistent with Grade II diastolic dysfunction (pseudonormalization). Elevated left ventricular end-diastolic pressure.  2. Right ventricular systolic function is normal. The right ventricular size is normal. There is normal pulmonary artery systolic pressure.  3. The mitral valve is degenerative. Trivial mitral valve regurgitation. No evidence of mitral stenosis. Moderate mitral annular calcification.  4. The aortic valve is tricuspid.  Aortic valve regurgitation is not visualized. Mild to moderate aortic valve sclerosis/calcification is present, without any evidence of aortic stenosis.  5. The inferior vena cava is normal in size with greater than 50% respiratory variability, suggesting right atrial pressure of 3 mmHg. FINDINGS  Left Ventricle: Left ventricular ejection fraction, by estimation, is 60 to 65%. The left ventricle has normal function. The left ventricle has no regional wall motion abnormalities. Definity contrast agent was given IV to delineate the left ventricular  endocardial borders. The left ventricular internal cavity size was normal in size. There is no left ventricular hypertrophy. Left ventricular diastolic parameters are consistent with Grade II diastolic dysfunction (pseudonormalization). Elevated left ventricular end-diastolic pressure. Right Ventricle: The right ventricular size is normal. No increase in right ventricular wall thickness. Right ventricular systolic function is normal. There is normal pulmonary artery systolic pressure. The tricuspid regurgitant velocity is 2.75 m/s, and  with an assumed right atrial pressure of 3 mmHg, the estimated right ventricular systolic pressure is 95.6 mmHg. Left Atrium: Left atrial size was normal in size. Right Atrium: Right atrial size was normal  in size. Pericardium: There is no evidence of pericardial effusion. Mitral Valve: The mitral valve is degenerative in appearance. There is moderate thickening of the mitral valve leaflet(s). There is moderate calcification of the mitral valve leaflet(s). Moderate mitral annular calcification. Trivial mitral valve regurgitation. No evidence of mitral valve stenosis. Tricuspid Valve: The tricuspid valve is normal in structure. Tricuspid valve regurgitation is mild . No evidence of tricuspid stenosis. Aortic Valve: The aortic valve is tricuspid. Aortic valve regurgitation is not visualized. Mild to moderate aortic valve sclerosis/calcification is present, without any evidence of aortic stenosis. Pulmonic Valve: The pulmonic valve was normal in structure. Pulmonic valve regurgitation is not visualized. No evidence of pulmonic stenosis. Aorta: The aortic root is normal in size and structure. Venous: The inferior vena cava is normal in size with greater than 50% respiratory variability, suggesting right atrial pressure of 3 mmHg. IAS/Shunts: No atrial level shunt detected by color flow Doppler.  LEFT VENTRICLE PLAX 2D LVIDd:         5.80 cm  Diastology LVIDs:         4.10 cm  LV e' medial:    3.81 cm/s LV PW:         0.70 cm  LV E/e' medial:  24.3 LV IVS:        1.00 cm  LV e' lateral:   4.57 cm/s LVOT diam:     2.00 cm  LV E/e' lateral: 20.2 LV SV:         78 LV SV Index:   40 LVOT Area:     3.14 cm  RIGHT VENTRICLE RV S prime:     13.10 cm/s TAPSE (M-mode): 2.0 cm LEFT ATRIUM             Index       RIGHT ATRIUM           Index LA diam:        3.40 cm 1.74 cm/m  RA Area:     18.10 cm LA Vol (A2C):   56.0 ml 28.62 ml/m RA Volume:   47.40 ml  24.23 ml/m LA Vol (A4C):   51.1 ml 26.12 ml/m LA Biplane Vol: 53.4 ml 27.29 ml/m  AORTIC VALVE LVOT Vmax:   104.00 cm/s LVOT Vmean:  86.300 cm/s LVOT VTI:    0.249 m  AORTA Ao Root diam: 3.30 cm Ao Asc  diam:  3.60 cm MITRAL VALVE                TRICUSPID VALVE MV Area (PHT):  2.48 cm     TR Peak grad:   30.2 mmHg MV Decel Time: 306 msec     TR Vmax:        275.00 cm/s MV E velocity: 92.40 cm/s MV A velocity: 138.00 cm/s  SHUNTS MV E/A ratio:  0.67         Systemic VTI:  0.25 m                             Systemic Diam: 2.00 cm Jenkins Rouge MD Electronically signed by Jenkins Rouge MD Signature Date/Time: 04/04/2020/11:23:04 AM    Final    VAS [place] LOWER EXTREMITY VENOUS (DVT)  Result Date: 04/13/2020  Lower Venous DVT Study Indications: Covid +, elevated D dimer.  Limitations: Body habitus, cooperation and poor ultrasound/tissue interface. Performing Technologist: Antonieta Pert RDMS, RVT  Examination Guidelines: A complete evaluation includes B-mode imaging, spectral Doppler, color Doppler, and power Doppler as needed of all accessible portions of each vessel. Bilateral testing is considered an integral part of a complete examination. Limited examinations for reoccurring indications may be performed as noted. The reflux portion of the exam is performed with the patient in reverse Trendelenburg.  +---------+---------------+---------+-----------+----------+--------------+ RIGHT    CompressibilityPhasicitySpontaneityPropertiesThrombus Aging +---------+---------------+---------+-----------+----------+--------------+ CFV      Full           Yes      Yes                                 +---------+---------------+---------+-----------+----------+--------------+ SFJ      Full                                                        +---------+---------------+---------+-----------+----------+--------------+ FV Prox  Full                                                        +---------+---------------+---------+-----------+----------+--------------+ FV Mid   Full                                                        +---------+---------------+---------+-----------+----------+--------------+ FV DistalFull                                                         +---------+---------------+---------+-----------+----------+--------------+ PFV      Full                                                        +---------+---------------+---------+-----------+----------+--------------+  POP      Full           Yes      Yes                                 +---------+---------------+---------+-----------+----------+--------------+ PTV      Full                                                        +---------+---------------+---------+-----------+----------+--------------+ PERO     Partial                            dilated   Acute          +---------+---------------+---------+-----------+----------+--------------+ GSV      Full                                                        +---------+---------------+---------+-----------+----------+--------------+   +---------+---------------+---------+-----------+----------+--------------+ LEFT     CompressibilityPhasicitySpontaneityPropertiesThrombus Aging +---------+---------------+---------+-----------+----------+--------------+ CFV      Full           Yes      Yes                                 +---------+---------------+---------+-----------+----------+--------------+ SFJ      Full                                                        +---------+---------------+---------+-----------+----------+--------------+ FV Prox  Full                                                        +---------+---------------+---------+-----------+----------+--------------+ FV Mid   Full                                                        +---------+---------------+---------+-----------+----------+--------------+ FV DistalFull                                                        +---------+---------------+---------+-----------+----------+--------------+ PFV      Full                                                         +---------+---------------+---------+-----------+----------+--------------+  POP      Full           Yes      Yes                                 +---------+---------------+---------+-----------+----------+--------------+ PTV      Full                                                        +---------+---------------+---------+-----------+----------+--------------+ PERO     Full                                                        +---------+---------------+---------+-----------+----------+--------------+ GSV      Full                                                        +---------+---------------+---------+-----------+----------+--------------+     Summary: RIGHT: - Findings consistent with acute deep vein thrombosis involving the right peroneal veins. - No cystic structure found in the popliteal fossa. - Small segment of thrombus in the right calf.  LEFT: - There is no evidence of deep vein thrombosis in the lower extremity. However, portions of this examination were limited- see technologist comments above.  - No cystic structure found in the popliteal fossa. - Difficult exam bilaterally due to large body habitus, poor penetration and poor patient cooperation.  *See table(s) above for measurements and observations. Electronically signed by Harold Barban MD on 04/13/2020 at 6:07:50 PM.    Final

## 2020-04-20 NOTE — TOC Progression Note (Signed)
Transition of Care [facility]) - Progression Note    Patient Details  Name: Ann [name] MRN: [id number] Date of Birth: Nov 21, 1950  [facility]) CM/SW [place], LCSW Phone Number: 04/20/2020, 5:15 PM  Clinical Narrative:    [facility] able to accept patient tomorrow if ready. CSW submitted clinicals to insurance for review.    Expected Discharge Plan: Skilled Nursing Facility Barriers to Discharge: SNF Pending bed offer,Insurance Authorization  Expected Discharge Plan and Services Expected Discharge Plan: [place] In-house Referral: Clinical Social Work   Post Acute Care Choice: [place] Living arrangements for the past 2 months: Apartment                                       Social Determinants of Health (SDOH) Interventions    Readmission Risk Interventions Readmission Risk Prevention Plan 02/23/2019  Transportation Screening Complete  PCP or Specialist Appt within 5-7 Days Complete  Home Care Screening Not Complete  Home Care Screening Not Completed Comments NA  Medication Review (RN CM) Not Complete  Med Review comments NA  Some recent data might be hidden

## 2020-04-20 NOTE — Progress Notes (Signed)
Called IR to see is pt on the schedule for her dialysis cath today.  She is on the schedule, but they have a full schedule.  They will reassess, and let me know if she is going to be pushed back until tomorrow.  If schedule delayed she can eat, as she's been NPO since midnight.

## 2020-04-20 NOTE — Progress Notes (Signed)
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  The rising bun/creat in setting of covid-19 infection.  The first HD on 04/12/20, second HD 12/22, third HD 12/24.   Now oliguric with worsening renal parameters.  No sign of renal recovery.  The kidney ultrasound showed atrophied kidneys therefore she is now ESRD. Consulted IR to convert tunneled HD catheter placement. Plan for HD today as she could not get last night. Consulted Education officer, museum to arrange outpatient HD center.    # Covid-19 infection- per primary team.   # Vascular access- RIJ temp HD catheter placed 04/11/20. Had been referred to [facility] for access placement as OP.  Plan for [facility] by IR.  Hold off on permanent access because of Covid positive.  # HTN/Volume-BP acceptable.  Volume management with dialysis.  # Anemia of CKD stage IV: Hemoglobin stable, continue ESA.  No iron because of Covid.Transfuse prn.  # CAD- stable  # Right peroneal DVT- started on Eliquis per primary.  #Hypokalemia: 4K bath during HD.  Subjective: Seen and examined.  Receiving dialysis.  Tolerating well.  Urine output only 300 cc.  No new event. Objective Vital signs in last 24 hours: Vitals:   04/20/20 0830 04/20/20 0900 04/20/20 0930 04/20/20 1000  BP: (!) 141/81 119/73 127/79 122/79  Pulse:      Resp: 12 13    Temp:      TempSrc:      SpO2:      Weight:       Weight change:   Intake/Output Summary (Last 24 hours) at 04/20/2020 1013 Last data filed at 04/19/2020 2036 Gross per 24 hour  Intake 240 ml  Output 300 ml  Net -60 ml       Labs: Basic Metabolic Panel: Recent Labs  Lab 04/18/20 0432 04/19/20 0259 04/20/20 0400  NA 138 139 139  K 3.3* 3.2* 3.2*  CL 101 102 104  CO2 27 26 26   GLUCOSE 212* 194* 104*  BUN 33* 35* 38*  CREATININE 3.71* 4.42* 4.67*  CALCIUM 8.1* 8.1* 8.2*  PHOS  --  3.5 4.1    Liver Function Tests: Recent Labs  Lab 04/15/20 0624 04/16/20 0214 04/18/20 0432 04/19/20 0259 04/20/20 0400  AST 25 24 22   --   --   ALT 22 23 21   --   --   ALKPHOS 60 57 60  --   --   BILITOT 0.9 0.6 0.5  --   --   PROT 5.4* 5.1* 4.6*  --   --   ALBUMIN 2.5* 2.4* 2.4* 2.2* 2.3*   No results for input(s): LIPASE, AMYLASE in the last 168 hours. No results for input(s): AMMONIA in the last 168 hours. CBC: Recent Labs  Lab 04/14/20 0145 04/15/20 0624 04/16/20 0214 04/18/20 0432 04/19/20 0259 04/20/20 0400  WBC 9.7 10.4 7.7 5.4 4.0 4.0  NEUTROABS 8.7* 8.9* 6.1  --   --   --   HGB 10.7* 10.5* 10.6* 9.4* 8.7* 8.8*  HCT 32.5* 30.3* 31.9* 28.8* 27.7* 28.0*  MCV 88.6 84.4 85.1 88.6 89.9 90.9  PLT 275 256 204 124* 115* 105*   Cardiac Enzymes: No results for input(s): CKTOTAL, CKMB, CKMBINDEX, TROPONINI in the last 168 hours. CBG: Recent Labs  Lab 04/19/20 0719 04/19/20 1136 04/19/20 1714 04/19/20 2134 04/20/20 0726  GLUCAP 202* 252* 258* 120* 106*    Iron Studies:  No results  for input(s): IRON, TIBC, TRANSFERRIN, FERRITIN in the last 72 hours. Studies/Results: No results found.  Medications: Infusions: . sodium chloride    . sodium chloride      Scheduled Medications: . sodium chloride   Intravenous Once  . amLODipine  5 mg Oral Daily  . apixaban  5 mg Oral BID  . vitamin C  500 mg Oral Daily  . Chlorhexidine Gluconate Cloth  6 each Topical Q0600  . Chlorhexidine Gluconate Cloth  6 each Topical Q0600  . Chlorhexidine Gluconate Cloth  6 each Topical Q0600  . darbepoetin (ARANESP) injection - DIALYSIS  60 mcg Intravenous Q Fri-HD  . heparin sodium (porcine)      . hydrALAZINE  50 mg Oral TID  . insulin aspart  0-15 Units Subcutaneous TID WC  . insulin aspart  0-5 Units Subcutaneous QHS  . insulin detemir  10 Units Subcutaneous Daily  . Ipratropium-Albuterol  1 puff Inhalation BID  . loratadine  10 mg Oral Daily  . metoprolol tartrate  25 mg Oral BID   . nystatin   Topical BID  . pantoprazole  20 mg Oral Daily  . pravastatin  40 mg Oral QPM  . rOPINIRole  0.25 mg Oral QPM    have reviewed scheduled and prn medications.  Physical Exam: General:NAD, comfortable Heart:RRR, s1s2 nl Lungs:clear b/l, no crackle Abdomen:soft, Non-tender, non-distended Extremities:No edema Dialysis Access: IJ temporary HD catheter  Ann [name] [name] 04/20/2020,10:13 AM  LOS: 9 days

## 2020-04-20 NOTE — Progress Notes (Signed)
OT Cancellation Note  Patient Details Name: Ann [name] MRN: [id number] DOB: 03/11/1951   Cancelled Treatment:    Reason Eval/Treat Not Completed: Patient at procedure or test/ unavailable (HD. Will return as schedule allows. Thank you.)  [place], OTR/L Acute Rehab Pager: [phone] Office: [phone] 04/20/2020, 8:05 AM

## 2020-04-20 NOTE — Progress Notes (Signed)
Renal Navigator received notification from Dr. Carolin Sicks that patient needs outpatient HD referral for ESRD treatment. Referral submitted to [place] clinic for COVID iso treatment through 1/1. Her first treatment in a negative shift will be Monday 1/3, since she first tested positive on 04/03/20. All documents have been faxed with the exception of tunneled catheter procedure note, which Navigator will send when completed. Navigator will follow closely and follow up with team once patient has been accepted and seat secured.  Alphonzo Cruise, [place] Renal Navigator [phone]

## 2020-04-20 NOTE — Progress Notes (Signed)
Skilled Nursing Rehab Facilities-   https://www.medicare.gov/care-compare/    Ratings out of 5 possible   Name          Address       Phone #     Quality Care                                       Staffing-Health-Inspection-Overall [facility]-[address], [place], [phone].     3 2 2 2 [facility]- [address], [place], [phone].          3 2 2 2 [facility]-[address], [place], [phone].   1     2         1          1   

## 2020-04-20 NOTE — TOC Initial Note (Signed)
Transition of Care [facility]) - Initial/Assessment Note    Patient Details  Name: Ann [name] MRN: [id number] Date of Birth: [birth year]  Transition of Care [facility]) CM/SW Contact:    Benard Halsted, LCSW Phone Number: 04/20/2020, 12:05 PM  Clinical Narrative:                 CSW received consult for possible SNF placement at time of discharge. CSW spoke with patient's daughter, [name]. She reported that patient's family is currently unable to care for patient at their home given patient's current physical needs and fall risk. She expressed understanding of PT recommendation and is agreeable to SNF placement at time of discharge. CSW made her aware of the COVID accepting SNF bed offer at [facility]. CSW discussed insurance authorization process and provided Medicare SNF ratings list. CSW awaiting confirmation from [place] that they will be able to transport her to [place] (renal navigator working on seat).     Expected Discharge Plan: Skilled Nursing Facility Barriers to Discharge: SNF Pending bed offer,Insurance Authorization   Patient Goals and CMS Choice Patient states their goals for this hospitalization and ongoing recovery are:: Rehab CMS Medicare.gov Compare Post Acute Care list provided to:: Patient Represenative (must comment) Choice offered to / list presented to : Adult Children  Expected Discharge Plan and Services Expected Discharge Plan: [place] In-house Referral: Clinical Social Work   Post Acute Care Choice: [place] Living arrangements for the past 2 months: Apartment                                      Prior Living Arrangements/Services Living arrangements for the past 2 months: Apartment Lives with:: Relatives Patient language and need for interpreter reviewed:: Yes Do you feel safe going back to the place where you live?: Yes      Need for Family Participation in Patient Care: Yes (Comment) Care giver support system in  place?: Yes (comment)   Criminal Activity/Legal Involvement Pertinent to Current Situation/Hospitalization: No - Comment as needed  Activities of Daily Living Home Assistive Devices/Equipment: Bedside commode/3-in-1,Wheelchair ADL Screening (condition at time of admission) Patient's cognitive ability adequate to safely complete daily activities?: No Is the patient deaf or have difficulty hearing?: No Does the patient have difficulty seeing, even when wearing glasses/contacts?: No Does the patient have difficulty concentrating, remembering, or making decisions?: No Patient able to express need for assistance with ADLs?: Yes Does the patient have difficulty dressing or bathing?: Yes Independently performs ADLs?: No Does the patient have difficulty walking or climbing stairs?: Yes Weakness of Legs: Both Weakness of Arms/Hands: Both  Permission Sought/Granted Permission sought to share information with : Facility Contact Representative,Family Supports Permission granted to share information with : No  Share Information with NAME: Stahlecker,Felicia (Daughter) [phone]  Permission granted to share info w AGENCY: SNFs        Emotional Assessment   Attitude/Demeanor/Rapport: Unable to Assess Affect (typically observed): Unable to Assess Orientation: : Oriented to Self,Oriented to Place Alcohol / Substance Use: Not Applicable Psych Involvement: No (comment)  Admission diagnosis:  Acute respiratory failure (HCC) [W29.56] Metabolic encephalopathy [O13.08] Acute respiratory failure with hypoxia (HCC) [J96.01] Acute renal failure superimposed on stage 5 chronic kidney disease, not on chronic dialysis, unspecified acute renal failure type ([place]) [N17.9, N18.5] Pneumonia due to COVID-19 virus [U07.1, J12.82] Patient Active Problem List   Diagnosis Date Noted  .  Acute respiratory failure with hypoxia ([place]) 04/11/2020  . 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 AKI (acute kidney injury) ([place])  04/04/2020  . Acute renal failure ([place]) 04/04/2020  . 2019 novel coronavirus disease (COVID-19) 04/03/2020  . GERD (gastroesophageal reflux disease)   . Constipation 02/25/2019  . Pyelonephritis 02/20/2019  . Acute encephalopathy 02/20/2019  . Chronic pain 02/20/2019  . Obesity, Class III, BMI 40-49.9 (morbid obesity) ([place]) 01/28/2013  . Uncontrolled type II diabetes mellitus with hyperglycemia ([place]) 03/25/2012  . Health care maintenance 03/25/2012  . Acute blood loss anemia 12/01/2011    Class: Acute  . 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 Plantar fasciitis of right foot 09/28/2011  . Breast pain 03/08/2011  . 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 Knee pain, right 01/20/2011  . Thoracic spine pain 12/13/2010  . Hyperlipidemia 10/21/2009  . CKD (chronic kidney disease), stage IV ([place]) 10/20/2009  . UNSPECIFIED ANEMIA 09/29/2008  . HYPERTENSION 09/29/2008  . CAD (coronary artery disease) 09/29/2008  . ASTHMA, UNSPECIFIED, UNSPECIFIED STATUS 09/29/2008   PCP:  [name], MD Pharmacy:   [place], [place] [address] [place] [address] Phone: [phone] Fax: [place], [place] [place] [address] Phone: [phone] Fax: [place], [place] - [address] [place] [address] Phone: [phone] Fax: [phone]  [facility] [place], [place] - [address] [place] [address] Phone: [phone] Fax: [phone]     Social Determinants of Health ([place]) Interventions    Readmission Risk Interventions Readmission Risk Prevention Plan 02/23/2019  Transportation Screening Complete  PCP or Specialist Appt within 5-7 Days Complete  Home Care Screening Not Complete  Home Care Screening Not Completed  Comments NA  Medication Review (RN CM) Not Complete  Med Review comments NA  Some recent data might be hidden

## 2020-04-21 ENCOUNTER — Inpatient Hospital Stay (HOSPITAL_COMMUNITY): Payer: Medicare Other

## 2020-04-21 ENCOUNTER — Other Ambulatory Visit: Payer: Self-pay

## 2020-04-21 HISTORY — PX: IR FLUORO GUIDE CV LINE LEFT: IMG2282

## 2020-04-21 HISTORY — PX: IR US GUIDE VASC ACCESS LEFT: IMG2389

## 2020-04-21 LAB — CBC
HCT: 26.2 % — ABNORMAL LOW (ref 36.0–46.0)
Hemoglobin: 8.6 g/dL — ABNORMAL LOW (ref 12.0–15.0)
MCH: 29.5 pg (ref 26.0–34.0)
MCHC: 32.8 g/dL (ref 30.0–36.0)
MCV: 89.7 fL (ref 80.0–100.0)
Platelets: 92 10*3/uL — ABNORMAL LOW (ref 150–400)
RBC: 2.92 MIL/uL — ABNORMAL LOW (ref 3.87–5.11)
RDW: 15.9 % — ABNORMAL HIGH (ref 11.5–15.5)
WBC: 3.6 10*3/uL — ABNORMAL LOW (ref 4.0–10.5)
nRBC: 0 % (ref 0.0–0.2)

## 2020-04-21 LAB — GLUCOSE, CAPILLARY
Glucose-Capillary: 106 mg/dL — ABNORMAL HIGH (ref 70–99)
Glucose-Capillary: 100 mg/dL — ABNORMAL HIGH (ref 70–99)
Glucose-Capillary: 85 mg/dL (ref 70–99)

## 2020-04-21 LAB — RENAL FUNCTION PANEL
Albumin: 2.4 g/dL — ABNORMAL LOW (ref 3.5–5.0)
Anion gap: 9 (ref 5–15)
BUN: 17 mg/dL (ref 8–23)
CO2: 26 mmol/L (ref 22–32)
Calcium: 8 mg/dL — ABNORMAL LOW (ref 8.9–10.3)
Chloride: 103 mmol/L (ref 98–111)
Creatinine, Ser: 3.14 mg/dL — ABNORMAL HIGH (ref 0.44–1.00)
GFR, Estimated: 15 mL/min — ABNORMAL LOW (ref >60)
Glucose, Bld: 155 mg/dL — ABNORMAL HIGH (ref 70–99)
Phosphorus: 2.7 mg/dL (ref 2.5–4.6)
Potassium: 3.3 mmol/L — ABNORMAL LOW (ref 3.5–5.1)
Sodium: 138 mmol/L (ref 135–145)

## 2020-04-21 MED ORDER — ZINC OXIDE 12.8 % EX OINT
TOPICAL_OINTMENT | CUTANEOUS | Status: DC | PRN
  Filled 2020-04-21: qty 56.7

## 2020-04-21 MED ORDER — HEPARIN SODIUM (PORCINE) 1000 UNIT/ML IJ SOLN
INTRAMUSCULAR | Status: AC
  Filled 2020-04-21: qty 1

## 2020-04-21 MED ORDER — FENTANYL CITRATE (PF) 100 MCG/2ML IJ SOLN
INTRAMUSCULAR | Status: AC
  Filled 2020-04-21: qty 2

## 2020-04-21 MED ORDER — LIDOCAINE HCL 1 % IJ SOLN
INTRAMUSCULAR | Status: AC | PRN
Start: 2020-04-21 — End: 2020-04-21
  Administered 2020-04-21: 15 mL

## 2020-04-21 MED ORDER — CEFAZOLIN SODIUM-DEXTROSE 2-4 GM/100ML-% IV SOLN
INTRAVENOUS | Status: AC
  Administered 2020-04-21: 2 g via INTRAVENOUS
  Filled 2020-04-21: qty 100

## 2020-04-21 MED ORDER — ALBUTEROL SULFATE HFA 108 (90 BASE) MCG/ACT IN AERS: 1.0000 | INHALATION_SPRAY | RESPIRATORY_TRACT | Status: DC | PRN

## 2020-04-21 MED ORDER — GELATIN ABSORBABLE 12-7 MM EX MISC
CUTANEOUS | Status: AC
  Filled 2020-04-21: qty 1

## 2020-04-21 MED ORDER — TOUJEO SOLOSTAR 300 UNIT/ML ~~LOC~~ SOPN: 6.0000 [IU] | PEN_INJECTOR | Freq: Every day | SUBCUTANEOUS | Status: DC

## 2020-04-21 MED ORDER — CHLORHEXIDINE GLUCONATE 4 % EX LIQD
CUTANEOUS | Status: AC
  Filled 2020-04-21: qty 15

## 2020-04-21 MED ORDER — FENTANYL CITRATE (PF) 100 MCG/2ML IJ SOLN
INTRAMUSCULAR | Status: AC | PRN
  Administered 2020-04-21: 50 ug via INTRAVENOUS

## 2020-04-21 MED ORDER — INSULIN ASPART 100 UNIT/ML ~~LOC~~ SOLN: SUBCUTANEOUS | 11 refills | Status: DC

## 2020-04-21 MED ORDER — INSULIN ASPART 100 UNIT/ML ~~LOC~~ SOLN: 0.0000 [IU] | Freq: Three times a day (TID) | SUBCUTANEOUS | Status: DC

## 2020-04-21 MED ORDER — LIDOCAINE HCL 1 % IJ SOLN
INTRAMUSCULAR | Status: AC
  Filled 2020-04-21: qty 20

## 2020-04-21 MED ORDER — MIDAZOLAM HCL 2 MG/2ML IJ SOLN
INTRAMUSCULAR | Status: AC
  Filled 2020-04-21: qty 2

## 2020-04-21 MED ORDER — AMLODIPINE BESYLATE 5 MG PO TABS: 5.0000 mg | ORAL_TABLET | Freq: Every day | ORAL | Status: DC

## 2020-04-21 MED ORDER — HEPARIN SODIUM (PORCINE) 1000 UNIT/ML IJ SOLN
INTRAMUSCULAR | Status: AC | PRN
  Administered 2020-04-21: 3.8 mL via INTRAVENOUS

## 2020-04-21 MED ORDER — MIDAZOLAM HCL 2 MG/2ML IJ SOLN
INTRAMUSCULAR | Status: AC | PRN
  Administered 2020-04-21: 0.5 mg via INTRAVENOUS
  Administered 2020-04-21: 1 mg via INTRAVENOUS

## 2020-04-21 NOTE — Sedation Documentation (Signed)
Awaiting transportation back to room at this time.

## 2020-04-21 NOTE — TOC Transition Note (Signed)
Transition of Care [facility]) - CM/SW Discharge Note   Patient Details  Name: Ann [name] MRN: [id number] Date of Birth: 1950/08/22  [facility]) CM/SW Contact:  Benard Halsted, LCSW Phone Number: 04/21/2020, 4:07 PM   Clinical Narrative:    [facility] Anticipated DC date: 04/21/20 Family notified: Daughter, Counsellor by: [name] after IR procedure, scheduled for about 7pm (If any issues arise, PTAR # is [phone])   Per MD patient ready for DC to [facility]. RN to call report prior to discharge [phone]). RN, patient, patient's family, and facility notified of DC. Discharge Summary and FL2 sent to facility. DC packet on chart. Ambulance transport requested for patient.   CSW will sign off for now as social work intervention is no longer needed. Please consult [place] again if new needs arise.      Final next level of care: Skilled Nursing Facility Barriers to Discharge: Barriers Resolved   Patient Goals and CMS Choice Patient states their goals for this hospitalization and ongoing recovery are:: Rehab CMS Medicare.gov Compare Post Acute Care list provided to:: Patient Represenative (must comment) Choice offered to / list presented to : Adult Children  Discharge Placement   Existing PASRR number confirmed : 04/21/20          Patient chooses bed at: [place] Patient to be transferred to facility by: [place] Name of family member notified: [name], daughter Patient and family notified of of transfer: 04/21/20  Discharge Plan and Services In-house Referral: Clinical Social Work   Post Acute Care Choice: [place]                               Social Determinants of Health ([place]) Interventions     Readmission Risk Interventions Readmission Risk Prevention Plan 02/23/2019  Transportation Screening Complete  PCP or Specialist Appt within 5-7 Days Complete  Home Care Screening Not Complete   Home Care Screening Not Completed Comments NA  Medication Review (RN CM) Not Complete  Med Review comments NA  Some recent data might be hidden

## 2020-04-21 NOTE — Procedures (Signed)
Pre-procedure Diagnosis: ESRD Post-procedure Diagnosis: Same  Successful placement of tunneled L IJ approach HD catheter with tips terminating within the superior aspect of the right atrium.   Successful bedside removal of non-tunneled R IJ approach HD catheter.  Complications: None Immediate EBL: Trace  The catheter is ready for immediate use.   Ronny Bacon, MD Pager #: [phone]

## 2020-04-21 NOTE — Sedation Documentation (Signed)
Awaiting transportation back to room 5W31 at this time

## 2020-04-21 NOTE — Plan of Care (Signed)
Patient is leaving from IR procedure.  She is ready to go.

## 2020-04-21 NOTE — Consult Note (Addendum)
[place] Nurse Consult Note: Patient receiving care in [facility] [phone] Consult completed remotely after review of chart and review of photos Reason for Consult: Left inner gluteal wound Wound type: MASD/IAD full thickness wound to the right gluteal fold and partial thickness wound to the left gluteal fold.  Pressure Injury POA: NA Wound bed: Right gluteal fold is pink, red, yellow and moist Left gluteal fold is pink and dry Drainage (amount, consistency, odor) None Periwound: darkened skin  Dressing procedure/placement/frequency: Clean the entire buttocks including the gluteal fold with no rinse cleanser. Pat dry then apply triple paste over the area to prevent moisture and evolvement of the wound. Apply PRN  Monitor the wound area(s) for worsening of condition such as: Signs/symptoms of infection, increase in size, development of or worsening of odor, development of pain, or increased pain at the affected locations.   Notify the medical team if any of these develop.  Thank you for the consult. [place] nurse will not follow at this time.   Please re-consult the [place] team if needed.  Cathlean Marseilles Tamala Julian, MSN, RN, [place], Lysle Pearl, [facility] Wound Treatment Associate Pager [phone]

## 2020-04-21 NOTE — TOC Progression Note (Signed)
Transition of Care [facility]) - Progression Note    Patient Details  Name: Ann [name] MRN: [id number] Date of Birth: 03-14-1951  Transition of Care [facility]) CM/SW [place], LCSW Phone Number: 04/21/2020, 11:57 AM  Clinical Narrative:    CSW updated patient's daughter, [name], that patient will discharge later tonight.    Expected Discharge Plan: [place] Barriers to Discharge: Barriers Resolved  Expected Discharge Plan and Services Expected Discharge Plan: [place] In-house Referral: Clinical Social Work   Post Acute Care Choice: [place] Living arrangements for the past 2 months: Apartment                                       Social Determinants of Health (SDOH) Interventions    Readmission Risk Interventions Readmission Risk Prevention Plan 02/23/2019  Transportation Screening Complete  PCP or Specialist Appt within 5-7 Days Complete  Home Care Screening Not Complete  Home Care Screening Not Completed Comments NA  Medication Review (RN CM) Not Complete  Med Review comments NA  Some recent data might be hidden

## 2020-04-21 NOTE — Progress Notes (Signed)
Returned Granddaughter, Chudasoma's phone call at [phone].  No answer, left voice mail message.

## 2020-04-21 NOTE — Sedation Documentation (Signed)
Handoff/ post procedure report given to [place], primary RN on [phone]. Notified [place] of new asymptomatic bradycardia that developed during dialysis catheter placement procedure. Performing radiologist Dr. Pascal Lux aware of new bradycardia. No new orders. Patient with no acute distress noted. Will transport back to [facility] with [facility].

## 2020-04-21 NOTE — Progress Notes (Signed)
Handoff/ post procedure report given to [place], primary RN on [phone]. Notified [place] of new asymptomatic bradycardia that developed during dialysis catheter placement procedure. Performing radiologist Dr. Pascal Lux aware of new bradycardia. No new orders. Patient with no acute distress noted. Will transport back to [facility] with [facility].

## 2020-04-21 NOTE — Progress Notes (Signed)
Sent note to Dr. Sloan Leiter: I just received report from IR, as the patient is returning to the floor.  They stated that she has a NEW onset of bradycardia from the procedure.  They reported that her current heart rate is 40.

## 2020-04-21 NOTE — Discharge Summary (Signed)
PATIENT DETAILS Name: Ann [name] Age: 69 y.o. Sex: female Date of Birth: 1950/04/28 MRN: [id number]. Admitting Physician: Guilford Shi, MD [id number], Christean Grief, MD  Admit Date: 04/11/2020 Discharge date: 04/21/2020  Recommendations for Outpatient Follow-up:  1. Follow up with PCP in 1-2 weeks 2. Please obtain CMP/CBC in one week 3. Repeat Chest Xray in 4-6 week 4. Please ensure follow-up at hemodialysis center per schedule.  Admitted From:  Home  Disposition: SNF   Home Health: No  Equipment/Devices: None  Discharge Condition: Stable  CODE STATUS: FULL CODE  Diet recommendation:  Diet Order            Diet NPO time specified Except for: Sips with Meds  Diet effective midnight           Diet - low sodium heart healthy                  Brief Summary: See H&P, Labs, Consult and Test reports for all details in brief, patient is a 69 year old female with history of stage IV CKD, anemia, CAD, DM-2, HTN, HLD, asthma, OSA on CPAP, chronic back pain-recently admitted at [facility] from 12/11-12/17 for GI symptoms related to COVID-19-presented to the hospital on 12/19 with poor oral intake, fatigue, shortness of breath-found to have acute hypoxic respiratory failure due to COVID-19 pneumonia, worsening AKI on CKD stage IV, acute metabolic encephalopathy due to uremia-she was subsequently admitted to the hospitalist service.  Further hospital course complicated by worsening renal failure-she was now thought to have ESRD and started on HD, she was also found to have acute lower extremity DVT.  See below for further details.  Brief Hospital Course: Acute Hypoxic Respiratory Failure secondary to COVID-19 pneumonia: She is unvaccinated-treated with steroids-received 1 dose of Actemra on admission.  She is clinically improved and now on room air.  COVID-19 Labs:  No results for input(s): DDIMER, FERRITIN, LDH, CRP in the last 72 hours.  Lab Results  Component Value Date    [place] (A) 04/03/2020   [place] NEGATIVE 02/26/2019   [place] NEGATIVE 02/20/2019      Acute DVT in right lower extremity: Due to hypercoagulable state from COVID-on Eliquis-would recommend at least 3 months of uninterrupted anticoagulation therapy.  Progressive AKI on CKD stage IV-now ESRD: Followed by nephrology closely during this hospital stay-she is now ESRD-she will get a tunneled hemodialysis catheter placed later this evening-following which she can be discharged to SNF.  Outpatient HD arrangements have been arranged by social work.  Acute metabolic encephalopathy: Secondary to uremia-resolved-she is completely awake and alert.  CT head without acute findings.  DM-2 (A1c 6.6 on 12/12): CBGs relatively stable-continue Levemir/SSI on discharge.  HTN: BP stable-continue amlodipine, hydralazine and metoprolol.  No longer on Imdur.  Follow and optimize  HLD: Continue statin  Chronic pain: Resume Neurontin/Cymbalta on discharge.  GERD: Continue   Pressure injury:MASD/IAD full thickness wound to the right gluteal fold and partial thickness wound to the left gluteal fold.   Recommendations from wound care are to Clean the entire buttocks including the gluteal fold with no rinse cleanser. Pat dry then apply triple paste over the area to prevent moisture and evolvement of the wound. Apply PRN  Discharge Diagnoses:  Principal Problem:   Acute respiratory failure with hypoxia (HCC) Active Problems:   HYPERTENSION   CAD (coronary artery disease)   Obesity, Class III, BMI 40-49.9 (morbid obesity) ([place])   Acute encephalopathy   Chronic pain   2019 novel coronavirus disease (  COVID-19)   GERD (gastroesophageal reflux disease)   AKI (acute kidney injury) [facility])   Discharge Instructions:    Person Under Monitoring Name: Elycia Woodside Milillo  Location: [address] [place]  [address]   Infection Prevention Recommendations for Individuals Confirmed to  have, or Being Evaluated for, 2019 Novel Coronavirus (COVID-19) Infection Who Receive Care at Home  Individuals who are confirmed to have, or are being evaluated for, COVID-19 should follow the prevention steps below until a healthcare provider or local or state health department says they can return to normal activities.  Stay home except to get medical care You should restrict activities outside your home, except for getting medical care. Do not go to work, school, or public areas, and do not use public transportation or taxis.  Call ahead before visiting your doctor Before your medical appointment, call the healthcare provider and tell them that you have, or are being evaluated for, COVID-19 infection. This will help the healthcare provider's office take steps to keep other people from getting infected. Ask your healthcare provider to call the local or state health department.  Monitor your symptoms Seek prompt medical attention if your illness is worsening (e.g., difficulty breathing). Before going to your medical appointment, call the healthcare provider and tell them that you have, or are being evaluated for, COVID-19 infection. Ask your healthcare provider to call the local or state health department.  Wear a facemask You should wear a facemask that covers your nose and mouth when you are in the same room with other people and when you visit a healthcare provider. People who live with or visit you should also wear a facemask while they are in the same room with you.  Separate yourself from other people in your home As much as possible, you should stay in a different room from other people in your home. Also, you should use a separate bathroom, if available.  Avoid sharing household items You should not share dishes, drinking glasses, cups, eating utensils, towels, bedding, or other items with other people in your home. After using these items, you should wash them thoroughly  with soap and water.  Cover your coughs and sneezes Cover your mouth and nose with a tissue when you cough or sneeze, or you can cough or sneeze into your sleeve. Throw used tissues in a lined trash can, and immediately wash your hands with soap and water for at least 20 seconds or use an alcohol-based hand rub.  Wash your [facility] your hands often and thoroughly with soap and water for at least 20 seconds. You can use an alcohol-based hand sanitizer if soap and water are not available and if your hands are not visibly dirty. Avoid touching your eyes, nose, and mouth with unwashed hands.   Prevention Steps for Caregivers and Household Members of Individuals Confirmed to have, or Being Evaluated for, COVID-19 Infection Being Cared for in the Home  If you live with, or provide care at home for, a person confirmed to have, or being evaluated for, COVID-19 infection please follow these guidelines to prevent infection:  Follow healthcare provider's instructions Make sure that you understand and can help the patient follow any healthcare provider instructions for all care.  Provide for the patient's basic needs You should help the patient with basic needs in the home and provide support for getting groceries, prescriptions, and other personal needs.  Monitor the patient's symptoms If they are getting sicker, call his or her medical  provider and tell them that the patient has, or is being evaluated for, COVID-19 infection. This will help the healthcare provider's office take steps to keep other people from getting infected. Ask the healthcare provider to call the local or state health department.  Limit the number of people who have contact with the patient  If possible, have only one caregiver for the patient.  Other household members should stay in another home or place of residence. If this is not possible, they should stay  in another room, or be separated from the patient as much  as possible. Use a separate bathroom, if available.  Restrict visitors who do not have an essential need to be in the home.  Keep older adults, very young children, and other sick people away from the patient Keep older adults, very young children, and those who have compromised immune systems or chronic health conditions away from the patient. This includes people with chronic heart, lung, or kidney conditions, diabetes, and cancer.  Ensure good ventilation Make sure that shared spaces in the home have good air flow, such as from an air conditioner or an opened window, weather permitting.  Wash your hands often  Wash your hands often and thoroughly with soap and water for at least 20 seconds. You can use an alcohol based hand sanitizer if soap and water are not available and if your hands are not visibly dirty.  Avoid touching your eyes, nose, and mouth with unwashed hands.  Use disposable paper towels to dry your hands. If not available, use dedicated cloth towels and replace them when they become wet.  Wear a facemask and gloves  Wear a disposable facemask at all times in the room and gloves when you touch or have contact with the patient's blood, body fluids, and/or secretions or excretions, such as sweat, saliva, sputum, nasal mucus, vomit, urine, or feces.  Ensure the mask fits over your nose and mouth tightly, and do not touch it during use.  Throw out disposable facemasks and gloves after using them. Do not reuse.  Wash your hands immediately after removing your facemask and gloves.  If your personal clothing becomes contaminated, carefully remove clothing and launder. Wash your hands after handling contaminated clothing.  Place all used disposable facemasks, gloves, and other waste in a lined container before disposing them with other household waste.  Remove gloves and wash your hands immediately after handling these items.  Do not share dishes, glasses, or other household  items with the patient  Avoid sharing household items. You should not share dishes, drinking glasses, cups, eating utensils, towels, bedding, or other items with a patient who is confirmed to have, or being evaluated for, COVID-19 infection.  After the person uses these items, you should wash them thoroughly with soap and water.  Wash laundry thoroughly  Immediately remove and wash clothes or bedding that have blood, body fluids, and/or secretions or excretions, such as sweat, saliva, sputum, nasal mucus, vomit, urine, or feces, on them.  Wear gloves when handling laundry from the patient.  Read and follow directions on labels of laundry or clothing items and detergent. In general, wash and dry with the warmest temperatures recommended on the label.  Clean all areas the individual has used often  Clean all touchable surfaces, such as counters, tabletops, doorknobs, bathroom fixtures, toilets, phones, keyboards, tablets, and bedside tables, every day. Also, clean any surfaces that may have blood, body fluids, and/or secretions or excretions on them.  Wear gloves  when cleaning surfaces the patient has come in contact with.  Use a diluted bleach solution (e.g., dilute bleach with 1 part bleach and 10 parts water) or a household disinfectant with a label that says EPA-registered for coronaviruses. To make a bleach solution at home, add 1 tablespoon of bleach to 1 quart (4 cups) of water. For a larger supply, add  cup of bleach to 1 gallon (16 cups) of water.  Read labels of cleaning products and follow recommendations provided on product labels. Labels contain instructions for safe and effective use of the cleaning product including precautions you should take when applying the product, such as wearing gloves or eye protection and making sure you have good ventilation during use of the product.  Remove gloves and wash hands immediately after cleaning.  Monitor yourself for signs and symptoms  of illness Caregivers and household members are considered close contacts, should monitor their health, and will be asked to limit movement outside of the home to the extent possible. Follow the monitoring steps for close contacts listed on the symptom monitoring form.   ? If you have additional questions, contact your local health department or call the epidemiologist on call at [phone] (available 24/7). ? This guidance is subject to change. For the most up-to-date guidance from CDC, please refer to their website: YouBlogs.pl    Activity:  As tolerated with Full fall precautions use walker/cane & assistance as needed   Discharge Instructions    Call MD for:  difficulty breathing, headache or visual disturbances   Complete by: As directed    Diet - low sodium heart healthy   Complete by: As directed    Discharge wound care:   Complete by: As directed    Clean the entire buttocks including the gluteal fold with no rinse cleanser. Pat dry then apply triple paste over the area to prevent moisture and evolvement of the wound. Apply PRN      04/21/20 0826   Increase activity slowly   Complete by: As directed      Allergies as of 04/21/2020      Reactions   Codeine Nausea And Vomiting   Metronidazole Itching      Medication List    STOP taking these medications   aspirin EC 81 MG tablet   isosorbide mononitrate 30 MG 24 hr tablet Commonly known as: IMDUR   sodium bicarbonate 650 MG tablet     TAKE these medications   Accu-Chek FastClix Lancets Misc USE AS DIRECTED TO CHECK BLOOD SUGAR FOUR TIMES A DAY(BEFORE MEALS AND AT BEDTIME). What changed: See the new instructions.   Accu-Chek SmartView test strip Generic drug: glucose blood CHECK BLOOD SUGAR BEFORE MEALS AND AT BEDTIME What changed: See the new instructions.   albuterol 108 (90 Base) MCG/ACT inhaler Commonly known as: VENTOLIN HFA Inhale  1-2 puffs into the lungs every 4 (four) hours as needed for wheezing or shortness of breath.   amLODipine 5 MG tablet Commonly known as: NORVASC Take 1 tablet (5 mg total) by mouth daily. What changed:   medication strength  how much to take   apixaban 5 MG Tabs tablet Commonly known as: ELIQUIS Take 1 tablet (5 mg total) by mouth 2 (two) times daily.   DULoxetine 60 MG capsule Commonly known as: CYMBALTA Take 60 mg by mouth daily.   gabapentin 100 MG capsule Commonly known as: NEURONTIN Take 1 capsule (100 mg total) by mouth at bedtime.   hydrALAZINE 50 MG tablet Commonly  known as: APRESOLINE Take 50 mg by mouth 3 (three) times daily.   insulin aspart 100 UNIT/ML injection Commonly known as: novoLOG 0-6 units, subcutaneous, 3 times daily with meals  CBG < 70: Implement Hypoglycemia measures CBG 70 - 120: 0 units CBG 121 - 150: 0 units CBG 151 - 200: 1 unit CBG 201-250: 2 units CBG 251-300: 3 units CBG 301-350: 4 units CBG 351-400: 5 units CBG > 400: Give 6 units and call MD   Insulin Pen Needle 31G X 8 MM Misc Commonly known as: Easy Touch Pen Needles Use to inject insulin as instructed.   metoprolol tartrate 50 MG tablet Commonly known as: LOPRESSOR Take 0.5 tablets (25 mg total) by mouth 2 (two) times daily.   pantoprazole 20 MG tablet Commonly known as: PROTONIX TAKE 1 TABLET BY MOUTH DAILY   pravastatin 40 MG tablet Commonly known as: PRAVACHOL TAKE ONE TABLET BY MOUTH EVERY EVENING What changed: when to take this   rOPINIRole 0.25 MG tablet Commonly known as: REQUIP Take 0.25 mg by mouth every evening.   Toujeo SoloStar 300 UNIT/ML Solostar Pen Generic drug: insulin glargine (1 Unit Dial) Inject 6 Units into the skin at bedtime. What changed: how much to take            Discharge Care Instructions  (From admission, onward)         Start     Ordered   04/21/20 0000  Discharge wound care:       Comments: Clean the entire buttocks  including the gluteal fold with no rinse cleanser. Pat dry then apply triple paste over the area to prevent moisture and evolvement of the wound. Apply PRN      04/21/20 0826   04/21/20 1205          Contact information for follow-up providers    [name], MD. Schedule an appointment as soon as possible for a visit in 1 week(s).   Specialty: Internal Medicine Contact information: [address] [place] [address] [phone]            Contact information for after-discharge care    Destination    [facility] Preferred SNF .   Service: Skilled Nursing Contact information: [address] [address] [phone]                 Allergies  Allergen Reactions  . Codeine Nausea And Vomiting  . Metronidazole Itching      Consultations:  Nephrology   Other Procedures/Studies: CT ABDOMEN PELVIS WO CONTRAST  Result Date: 04/03/2020 CLINICAL DATA:  Abdominal pain. EXAM: CT ABDOMEN AND PELVIS WITHOUT CONTRAST TECHNIQUE: Multidetector CT imaging of the abdomen and pelvis was performed following the standard protocol without IV contrast. COMPARISON:  02/20/2019 FINDINGS: Lower chest: Patchy ground-glass infiltrates in the right lower lobe could be due to developing pneumonia, atypical pneumonia or acute aspiration. No pleural effusions. The heart is normal in size. No pericardial effusion. Three-vessel coronary artery calcifications are noted. Small hiatal hernia. Hepatobiliary: No hepatic lesions are identified without contrast. No intrahepatic biliary dilatation. The gallbladder is surgically absent. No common bile duct dilatation. Pancreas: Marked fatty atrophy of the pancreas. No mass, inflammation or ductal dilatation. Spleen: Normal size.  No focal lesions. Adrenals/Urinary Tract: The adrenal glands are unremarkable. Small, chronically atrophied kidneys consistent with chronic renal failure. No worrisome  renal lesions or hydroureteronephrosis. The bladder is unremarkable. Stomach/Bowel: The stomach, duodenum, small bowel and colon are  unremarkable. No acute inflammatory changes, mass lesions or obstructive findings. The terminal ileum is normal. Scattered colonic diverticulosis but no findings for acute diverticulitis. Vascular/Lymphatic: Significant diffuse vascular calcifications. No aneurysm. No mesenteric or retroperitoneal mass or adenopathy. No free air or free fluid. No leaking oral contrast. Reproductive: The uterus is surgically absent. Both ovaries are still present and appear normal. Other: Moderate-sized periumbilical abdominal wall hernia containing fat. Musculoskeletal: No significant bony findings. IMPRESSION: 1. No acute abdominal/pelvic findings, mass lesions or adenopathy. 2. Patchy ground-glass infiltrates in the right lower lobe could be due to developing pneumonia, atypical pneumonia or acute aspiration. 3. Status post cholecystectomy. No biliary dilatation. 4. Small, chronically atrophied kidneys consistent with chronic renal failure. 5. Moderate-sized periumbilical abdominal wall hernia containing fat. 6. Aortic atherosclerosis. Aortic Atherosclerosis (ICD10-I70.0). Electronically Signed   By: Marijo Sanes M.D.   On: 04/03/2020 19:19   CT HEAD WO CONTRAST  Result Date: 04/14/2020 CLINICAL DATA:  Delirium.  Altered mental status. EXAM: CT HEAD WITHOUT CONTRAST TECHNIQUE: Contiguous axial images were obtained from the base of the skull through the vertex without intravenous contrast. COMPARISON:  April 03, 2020. FINDINGS: Brain: No evidence of acute large vascular territory infarction, hemorrhage, hydrocephalus, extra-axial collection or mass lesion/mass effect. Similar cerebral atrophy with ex vacuo ventricular dilation. Mild white matter hypodensities, likely related to chronic microvascular ischemic disease. Vascular: Calcific atherosclerosis. Skull: No acute fracture.  Sinuses/Orbits: Left maxillary sinus retention cyst versus polyp. Unremarkable orbits. Other: No mastoid effusions. IMPRESSION: 1. No evidence of acute intracranial abnormality. 2. Similar cerebral atrophy and ventriculomegaly. Electronically Signed   By: Margaretha Sheffield MD   On: 04/14/2020 16:08   CT Head Wo Contrast  Result Date: 04/03/2020 CLINICAL DATA:  Sudden onset of leg weakness today. EXAM: CT HEAD WITHOUT CONTRAST TECHNIQUE: Contiguous axial images were obtained from the base of the skull through the vertex without intravenous contrast. COMPARISON:  None. FINDINGS: Brain: Age advanced cerebral atrophy, ventriculomegaly and mild periventricular white matter disease. No extra-axial fluid collections are identified. No CT findings for acute hemispheric infarction or intracranial hemorrhage. No mass lesions. The brainstem and cerebellum are normal. Vascular: Moderate to advanced vascular calcifications but no aneurysm or hyperdense vessels. Skull: No skull fracture or bone lesions. Sinuses/Orbits: The paranasal sinuses and mastoid air cells are clear except for a 2 cm mucous retention cyst or polyp in the left maxillary sinus. The globes are intact. Other: No scalp lesions or scalp hematoma. IMPRESSION: 1. Age advanced cerebral atrophy, ventriculomegaly and mild periventricular white matter disease. 2. No acute intracranial findings or mass lesions. Electronically Signed   By: Marijo Sanes M.D.   On: 04/03/2020 19:14   US RENAL  Result Date: 04/04/2020 CLINICAL DATA:  Acute kidney injury EXAM: RENAL / URINARY TRACT ULTRASOUND COMPLETE COMPARISON:  CT 04/03/2020 FINDINGS: Technical note: Significantly limited examination secondary to poor penetration related to patient body habitus and shadowing from overlying bowel gas. Right Kidney: Limited. Echogenic renal parenchyma with cortical thinning compatible with chronic medical renal disease. Right renal length of approximately 10.6 cm. No shadowing  stone or hydronephrosis is identified. Left Kidney: Very limited evaluation. Visualized left kidney appears diffusely echogenic with renal cortical thinning compatible with chronic medical renal disease. No obvious hydronephrosis. Bladder: Appears normal for degree of bladder distention. Other: None. IMPRESSION: 1. Limited exam. 2. No sonographic evidence of obstructive uropathy. 3. Atrophic, echogenic kidneys compatible with chronic medical renal disease. Electronically Signed   By: Davina Poke D.O.   On: 04/04/2020  12:18   DG CHEST PORT 1 VIEW  Result Date: 04/11/2020 CLINICAL DATA:  Hemodialysis catheter placement EXAM: PORTABLE CHEST 1 VIEW COMPARISON:  April 11, 2020 FINDINGS: The heart size is enlarged. Bilateral airspace disease is again noted and has progressed in the right mid and right upper lung zones. There is vascular congestion with small bilateral pleural effusions. There is a right-sided central venous catheter with tip projecting over the mid SVC. The catheter fall short of the cavoatrial junction by approximately 6-7 cm. There is no evidence for pneumothorax. Aortic calcifications are noted. The main pulmonary artery is dilated. IMPRESSION: 1. Interval worsening of bilateral airspace disease, greatest in the right mid and right upper lung zones. 2. Hemodialysis catheter as above. While the tip likely terminates in the SVC, a catheter fall short of the cavoatrial junction by at least 6 cm. 3. Persistent cardiomegaly with vascular congestion and small bilateral pleural effusions. Electronically Signed   By: Constance Holster M.D.   On: 04/11/2020 17:23   DG Chest Port 1 View  Result Date: 04/11/2020 CLINICAL DATA:  Shortness of breath. Cough. Hypoxia. COVID-19 virus infection. EXAM: PORTABLE CHEST 1 VIEW COMPARISON:  04/03/2020 FINDINGS: Heart size remains normal. New mild airspace opacity is seen in the central right upper lobe and peripheral left upper lobe. No evidence of  pleural effusion. IMPRESSION: New mild bilateral upper lobe airspace disease, suspicious for viral pneumonia. Electronically Signed   By: Marlaine Hind M.D.   On: 04/11/2020 11:30   DG Chest Port 1 View  Result Date: 04/03/2020 CLINICAL DATA:  Right lower lobe infiltrate EXAM: PORTABLE CHEST 1 VIEW COMPARISON:  Same day CT abdomen pelvis, 04/03/2020, chest radiographs, 04/05/2014 FINDINGS: The heart size and mediastinal contours are within normal limits. Subtle heterogeneous airspace opacity of the right lung base. The visualized skeletal structures are unremarkable. IMPRESSION: Subtle heterogeneous airspace opacity of the right lung base, in keeping with findings of prior CT and consistent with infection or aspiration. Electronically Signed   By: Eddie Candle M.D.   On: 04/03/2020 20:23   ECHOCARDIOGRAM COMPLETE  Result Date: 04/04/2020    ECHOCARDIOGRAM REPORT   Patient Name:   Ann [name] Tate Date of Exam: 04/04/2020 Medical Rec #:  [id number]     Height:       62.0 in Accession #:    [id number]    Weight:       211.2 lb Date of Birth:  27-Dec-1950     BSA:          1.956 m Patient Age:    16 years      BP:           146/78 mmHg Patient Gender: F             HR:           82 bpm. Exam Location:  Inpatient Procedure: 2D Echo, Cardiac Doppler, Color Doppler and Intracardiac            Opacification Agent Indications:    Abnormal ECG 794.31 / R94.31  History:        Patient has prior history of Echocardiogram examinations, most                 recent 11/11/1996. CAD, Signs/Symptoms:Shortness of Breath and                 Chest Pain; Risk Factors:Hypertension, Diabetes and  Dyslipidemia.  Sonographer:    Bernadene Person RDCS Referring Phys: [id number] [place]  1. Left ventricular ejection fraction, by estimation, is 60 to 65%. The left ventricle has normal function. The left ventricle has no regional wall motion abnormalities. Left ventricular diastolic parameters are consistent  with Grade II diastolic dysfunction (pseudonormalization). Elevated left ventricular end-diastolic pressure.  2. Right ventricular systolic function is normal. The right ventricular size is normal. There is normal pulmonary artery systolic pressure.  3. The mitral valve is degenerative. Trivial mitral valve regurgitation. No evidence of mitral stenosis. Moderate mitral annular calcification.  4. The aortic valve is tricuspid. Aortic valve regurgitation is not visualized. Mild to moderate aortic valve sclerosis/calcification is present, without any evidence of aortic stenosis.  5. The inferior vena cava is normal in size with greater than 50% respiratory variability, suggesting right atrial pressure of 3 mmHg. FINDINGS  Left Ventricle: Left ventricular ejection fraction, by estimation, is 60 to 65%. The left ventricle has normal function. The left ventricle has no regional wall motion abnormalities. Definity contrast agent was given IV to delineate the left ventricular  endocardial borders. The left ventricular internal cavity size was normal in size. There is no left ventricular hypertrophy. Left ventricular diastolic parameters are consistent with Grade II diastolic dysfunction (pseudonormalization). Elevated left ventricular end-diastolic pressure. Right Ventricle: The right ventricular size is normal. No increase in right ventricular wall thickness. Right ventricular systolic function is normal. There is normal pulmonary artery systolic pressure. The tricuspid regurgitant velocity is 2.75 m/s, and  with an assumed right atrial pressure of 3 mmHg, the estimated right ventricular systolic pressure is 71.6 mmHg. Left Atrium: Left atrial size was normal in size. Right Atrium: Right atrial size was normal in size. Pericardium: There is no evidence of pericardial effusion. Mitral Valve: The mitral valve is degenerative in appearance. There is moderate thickening of the mitral valve leaflet(s). There is moderate  calcification of the mitral valve leaflet(s). Moderate mitral annular calcification. Trivial mitral valve regurgitation. No evidence of mitral valve stenosis. Tricuspid Valve: The tricuspid valve is normal in structure. Tricuspid valve regurgitation is mild . No evidence of tricuspid stenosis. Aortic Valve: The aortic valve is tricuspid. Aortic valve regurgitation is not visualized. Mild to moderate aortic valve sclerosis/calcification is present, without any evidence of aortic stenosis. Pulmonic Valve: The pulmonic valve was normal in structure. Pulmonic valve regurgitation is not visualized. No evidence of pulmonic stenosis. Aorta: The aortic root is normal in size and structure. Venous: The inferior vena cava is normal in size with greater than 50% respiratory variability, suggesting right atrial pressure of 3 mmHg. IAS/Shunts: No atrial level shunt detected by color flow Doppler.  LEFT VENTRICLE PLAX 2D LVIDd:         5.80 cm  Diastology LVIDs:         4.10 cm  LV e' medial:    3.81 cm/s LV PW:         0.70 cm  LV E/e' medial:  24.3 LV IVS:        1.00 cm  LV e' lateral:   4.57 cm/s LVOT diam:     2.00 cm  LV E/e' lateral: 20.2 LV SV:         78 LV SV Index:   40 LVOT Area:     3.14 cm  RIGHT VENTRICLE RV S prime:     13.10 cm/s TAPSE (M-mode): 2.0 cm LEFT ATRIUM  Index       RIGHT ATRIUM           Index LA diam:        3.40 cm 1.74 cm/m  RA Area:     18.10 cm LA Vol (A2C):   56.0 ml 28.62 ml/m RA Volume:   47.40 ml  24.23 ml/m LA Vol (A4C):   51.1 ml 26.12 ml/m LA Biplane Vol: 53.4 ml 27.29 ml/m  AORTIC VALVE LVOT Vmax:   104.00 cm/s LVOT Vmean:  86.300 cm/s LVOT VTI:    0.249 m  AORTA Ao Root diam: 3.30 cm Ao Asc diam:  3.60 cm MITRAL VALVE                TRICUSPID VALVE MV Area (PHT): 2.48 cm     TR Peak grad:   30.2 mmHg MV Decel Time: 306 msec     TR Vmax:        275.00 cm/s MV E velocity: 92.40 cm/s MV A velocity: 138.00 cm/s  SHUNTS MV E/A ratio:  0.67         Systemic VTI:  0.25 m                              Systemic Diam: 2.00 cm Jenkins Rouge MD Electronically signed by Jenkins Rouge MD Signature Date/Time: 04/04/2020/11:23:04 AM    Final    VAS [place] LOWER EXTREMITY VENOUS (DVT)  Result Date: 04/13/2020  Lower Venous DVT Study Indications: Covid +, elevated D dimer.  Limitations: Body habitus, cooperation and poor ultrasound/tissue interface. Performing Technologist: Antonieta Pert RDMS, RVT  Examination Guidelines: A complete evaluation includes B-mode imaging, spectral Doppler, color Doppler, and power Doppler as needed of all accessible portions of each vessel. Bilateral testing is considered an integral part of a complete examination. Limited examinations for reoccurring indications may be performed as noted. The reflux portion of the exam is performed with the patient in reverse Trendelenburg.  +---------+---------------+---------+-----------+----------+--------------+ RIGHT    CompressibilityPhasicitySpontaneityPropertiesThrombus Aging +---------+---------------+---------+-----------+----------+--------------+ CFV      Full           Yes      Yes                                 +---------+---------------+---------+-----------+----------+--------------+ SFJ      Full                                                        +---------+---------------+---------+-----------+----------+--------------+ FV Prox  Full                                                        +---------+---------------+---------+-----------+----------+--------------+ FV Mid   Full                                                        +---------+---------------+---------+-----------+----------+--------------+ FV DistalFull                                                        +---------+---------------+---------+-----------+----------+--------------+  PFV      Full                                                         +---------+---------------+---------+-----------+----------+--------------+ POP      Full           Yes      Yes                                 +---------+---------------+---------+-----------+----------+--------------+ PTV      Full                                                        +---------+---------------+---------+-----------+----------+--------------+ PERO     Partial                            dilated   Acute          +---------+---------------+---------+-----------+----------+--------------+ GSV      Full                                                        +---------+---------------+---------+-----------+----------+--------------+   +---------+---------------+---------+-----------+----------+--------------+ LEFT     CompressibilityPhasicitySpontaneityPropertiesThrombus Aging +---------+---------------+---------+-----------+----------+--------------+ CFV      Full           Yes      Yes                                 +---------+---------------+---------+-----------+----------+--------------+ SFJ      Full                                                        +---------+---------------+---------+-----------+----------+--------------+ FV Prox  Full                                                        +---------+---------------+---------+-----------+----------+--------------+ FV Mid   Full                                                        +---------+---------------+---------+-----------+----------+--------------+ FV DistalFull                                                        +---------+---------------+---------+-----------+----------+--------------+  PFV      Full                                                        +---------+---------------+---------+-----------+----------+--------------+ POP      Full           Yes      Yes                                  +---------+---------------+---------+-----------+----------+--------------+ PTV      Full                                                        +---------+---------------+---------+-----------+----------+--------------+ PERO     Full                                                        +---------+---------------+---------+-----------+----------+--------------+ GSV      Full                                                        +---------+---------------+---------+-----------+----------+--------------+     Summary: RIGHT: - Findings consistent with acute deep vein thrombosis involving the right peroneal veins. - No cystic structure found in the popliteal fossa. - Small segment of thrombus in the right calf.  LEFT: - There is no evidence of deep vein thrombosis in the lower extremity. However, portions of this examination were limited- see technologist comments above.  - No cystic structure found in the popliteal fossa. - Difficult exam bilaterally due to large body habitus, poor penetration and poor patient cooperation.  *See table(s) above for measurements and observations. Electronically signed by Harold Barban MD on 04/13/2020 at 6:07:50 PM.    Final      TODAY-DAY OF DISCHARGE:  Subjective:   Allisson Hildebran today has no headache,no chest abdominal pain,no new weakness tingling or numbness, feels much better wants to go home today.   Objective:   Blood pressure (!) 146/71, pulse 76, temperature 98 F (36.7 C), temperature source Oral, resp. rate 15, height 5' 2.01" (1.575 m), weight 97.9 kg, SpO2 100 %.  Intake/Output Summary (Last 24 hours) at 04/21/2020 1205 Last data filed at 04/20/2020 1448 Gross per 24 hour  Intake 120 ml  Output --  Net 120 ml   Filed Weights   04/20/20 0740 04/20/20 1054 04/21/20 0731  Weight: 99 kg 97.9 kg 97.9 kg    Exam: Awake Alert, Oriented *3, No new F.N deficits, Normal affect [place].AT,PERRAL Supple Neck,No JVD, No cervical  lymphadenopathy appriciated.  Symmetrical Chest wall movement, Good air movement bilaterally, CTAB RRR,No Gallops,Rubs or new Murmurs, No Parasternal Heave +ve B.Sounds, Abd Soft, Non tender, No organomegaly appriciated, No rebound -guarding or rigidity. No Cyanosis, Clubbing or edema, No new Rash  or bruise   PERTINENT RADIOLOGIC STUDIES: CT ABDOMEN PELVIS WO CONTRAST  Result Date: 04/03/2020 CLINICAL DATA:  Abdominal pain. EXAM: CT ABDOMEN AND PELVIS WITHOUT CONTRAST TECHNIQUE: Multidetector CT imaging of the abdomen and pelvis was performed following the standard protocol without IV contrast. COMPARISON:  02/20/2019 FINDINGS: Lower chest: Patchy ground-glass infiltrates in the right lower lobe could be due to developing pneumonia, atypical pneumonia or acute aspiration. No pleural effusions. The heart is normal in size. No pericardial effusion. Three-vessel coronary artery calcifications are noted. Small hiatal hernia. Hepatobiliary: No hepatic lesions are identified without contrast. No intrahepatic biliary dilatation. The gallbladder is surgically absent. No common bile duct dilatation. Pancreas: Marked fatty atrophy of the pancreas. No mass, inflammation or ductal dilatation. Spleen: Normal size.  No focal lesions. Adrenals/Urinary Tract: The adrenal glands are unremarkable. Small, chronically atrophied kidneys consistent with chronic renal failure. No worrisome renal lesions or hydroureteronephrosis. The bladder is unremarkable. Stomach/Bowel: The stomach, duodenum, small bowel and colon are unremarkable. No acute inflammatory changes, mass lesions or obstructive findings. The terminal ileum is normal. Scattered colonic diverticulosis but no findings for acute diverticulitis. Vascular/Lymphatic: Significant diffuse vascular calcifications. No aneurysm. No mesenteric or retroperitoneal mass or adenopathy. No free air or free fluid. No leaking oral contrast. Reproductive: The uterus is surgically  absent. Both ovaries are still present and appear normal. Other: Moderate-sized periumbilical abdominal wall hernia containing fat. Musculoskeletal: No significant bony findings. IMPRESSION: 1. No acute abdominal/pelvic findings, mass lesions or adenopathy. 2. Patchy ground-glass infiltrates in the right lower lobe could be due to developing pneumonia, atypical pneumonia or acute aspiration. 3. Status post cholecystectomy. No biliary dilatation. 4. Small, chronically atrophied kidneys consistent with chronic renal failure. 5. Moderate-sized periumbilical abdominal wall hernia containing fat. 6. Aortic atherosclerosis. Aortic Atherosclerosis (ICD10-I70.0). Electronically Signed   By: Marijo Sanes M.D.   On: 04/03/2020 19:19   CT HEAD WO CONTRAST  Result Date: 04/14/2020 CLINICAL DATA:  Delirium.  Altered mental status. EXAM: CT HEAD WITHOUT CONTRAST TECHNIQUE: Contiguous axial images were obtained from the base of the skull through the vertex without intravenous contrast. COMPARISON:  April 03, 2020. FINDINGS: Brain: No evidence of acute large vascular territory infarction, hemorrhage, hydrocephalus, extra-axial collection or mass lesion/mass effect. Similar cerebral atrophy with ex vacuo ventricular dilation. Mild white matter hypodensities, likely related to chronic microvascular ischemic disease. Vascular: Calcific atherosclerosis. Skull: No acute fracture. Sinuses/Orbits: Left maxillary sinus retention cyst versus polyp. Unremarkable orbits. Other: No mastoid effusions. IMPRESSION: 1. No evidence of acute intracranial abnormality. 2. Similar cerebral atrophy and ventriculomegaly. Electronically Signed   By: Margaretha Sheffield MD   On: 04/14/2020 16:08   CT Head Wo Contrast  Result Date: 04/03/2020 CLINICAL DATA:  Sudden onset of leg weakness today. EXAM: CT HEAD WITHOUT CONTRAST TECHNIQUE: Contiguous axial images were obtained from the base of the skull through the vertex without intravenous  contrast. COMPARISON:  None. FINDINGS: Brain: Age advanced cerebral atrophy, ventriculomegaly and mild periventricular white matter disease. No extra-axial fluid collections are identified. No CT findings for acute hemispheric infarction or intracranial hemorrhage. No mass lesions. The brainstem and cerebellum are normal. Vascular: Moderate to advanced vascular calcifications but no aneurysm or hyperdense vessels. Skull: No skull fracture or bone lesions. Sinuses/Orbits: The paranasal sinuses and mastoid air cells are clear except for a 2 cm mucous retention cyst or polyp in the left maxillary sinus. The globes are intact. Other: No scalp lesions or scalp hematoma. IMPRESSION: 1. Age advanced cerebral atrophy, ventriculomegaly and mild  periventricular white matter disease. 2. No acute intracranial findings or mass lesions. Electronically Signed   By: Marijo Sanes M.D.   On: 04/03/2020 19:14   US RENAL  Result Date: 04/04/2020 CLINICAL DATA:  Acute kidney injury EXAM: RENAL / URINARY TRACT ULTRASOUND COMPLETE COMPARISON:  CT 04/03/2020 FINDINGS: Technical note: Significantly limited examination secondary to poor penetration related to patient body habitus and shadowing from overlying bowel gas. Right Kidney: Limited. Echogenic renal parenchyma with cortical thinning compatible with chronic medical renal disease. Right renal length of approximately 10.6 cm. No shadowing stone or hydronephrosis is identified. Left Kidney: Very limited evaluation. Visualized left kidney appears diffusely echogenic with renal cortical thinning compatible with chronic medical renal disease. No obvious hydronephrosis. Bladder: Appears normal for degree of bladder distention. Other: None. IMPRESSION: 1. Limited exam. 2. No sonographic evidence of obstructive uropathy. 3. Atrophic, echogenic kidneys compatible with chronic medical renal disease. Electronically Signed   By: Davina Poke D.O.   On: 04/04/2020 12:18   DG CHEST PORT  1 VIEW  Result Date: 04/11/2020 CLINICAL DATA:  Hemodialysis catheter placement EXAM: PORTABLE CHEST 1 VIEW COMPARISON:  April 11, 2020 FINDINGS: The heart size is enlarged. Bilateral airspace disease is again noted and has progressed in the right mid and right upper lung zones. There is vascular congestion with small bilateral pleural effusions. There is a right-sided central venous catheter with tip projecting over the mid SVC. The catheter fall short of the cavoatrial junction by approximately 6-7 cm. There is no evidence for pneumothorax. Aortic calcifications are noted. The main pulmonary artery is dilated. IMPRESSION: 1. Interval worsening of bilateral airspace disease, greatest in the right mid and right upper lung zones. 2. Hemodialysis catheter as above. While the tip likely terminates in the SVC, a catheter fall short of the cavoatrial junction by at least 6 cm. 3. Persistent cardiomegaly with vascular congestion and small bilateral pleural effusions. Electronically Signed   By: Constance Holster M.D.   On: 04/11/2020 17:23   DG Chest Port 1 View  Result Date: 04/11/2020 CLINICAL DATA:  Shortness of breath. Cough. Hypoxia. COVID-19 virus infection. EXAM: PORTABLE CHEST 1 VIEW COMPARISON:  04/03/2020 FINDINGS: Heart size remains normal. New mild airspace opacity is seen in the central right upper lobe and peripheral left upper lobe. No evidence of pleural effusion. IMPRESSION: New mild bilateral upper lobe airspace disease, suspicious for viral pneumonia. Electronically Signed   By: Marlaine Hind M.D.   On: 04/11/2020 11:30   DG Chest Port 1 View  Result Date: 04/03/2020 CLINICAL DATA:  Right lower lobe infiltrate EXAM: PORTABLE CHEST 1 VIEW COMPARISON:  Same day CT abdomen pelvis, 04/03/2020, chest radiographs, 04/05/2014 FINDINGS: The heart size and mediastinal contours are within normal limits. Subtle heterogeneous airspace opacity of the right lung base. The visualized skeletal  structures are unremarkable. IMPRESSION: Subtle heterogeneous airspace opacity of the right lung base, in keeping with findings of prior CT and consistent with infection or aspiration. Electronically Signed   By: Eddie Candle M.D.   On: 04/03/2020 20:23   ECHOCARDIOGRAM COMPLETE  Result Date: 04/04/2020    ECHOCARDIOGRAM REPORT   Patient Name:   Ann [name] Date of Exam: 04/04/2020 Medical Rec #:  [id number]     Height:       62.0 in Accession #:    [id number]    Weight:       211.2 lb Date of Birth:  [birth year]     BSA:  1.956 m Patient Age:    14 years      BP:           146/78 mmHg Patient Gender: F             HR:           82 bpm. Exam Location:  Inpatient Procedure: 2D Echo, Cardiac Doppler, Color Doppler and Intracardiac            Opacification Agent Indications:    Abnormal ECG 794.31 / R94.31  History:        Patient has prior history of Echocardiogram examinations, most                 recent 11/11/1996. CAD, Signs/Symptoms:Shortness of Breath and                 Chest Pain; Risk Factors:Hypertension, Diabetes and                 Dyslipidemia.  Sonographer:    Bernadene Person RDCS Referring Phys: [id number] [place]  1. Left ventricular ejection fraction, by estimation, is 60 to 65%. The left ventricle has normal function. The left ventricle has no regional wall motion abnormalities. Left ventricular diastolic parameters are consistent with Grade II diastolic dysfunction (pseudonormalization). Elevated left ventricular end-diastolic pressure.  2. Right ventricular systolic function is normal. The right ventricular size is normal. There is normal pulmonary artery systolic pressure.  3. The mitral valve is degenerative. Trivial mitral valve regurgitation. No evidence of mitral stenosis. Moderate mitral annular calcification.  4. The aortic valve is tricuspid. Aortic valve regurgitation is not visualized. Mild to moderate aortic valve sclerosis/calcification is present, without  any evidence of aortic stenosis.  5. The inferior vena cava is normal in size with greater than 50% respiratory variability, suggesting right atrial pressure of 3 mmHg. FINDINGS  Left Ventricle: Left ventricular ejection fraction, by estimation, is 60 to 65%. The left ventricle has normal function. The left ventricle has no regional wall motion abnormalities. Definity contrast agent was given IV to delineate the left ventricular  endocardial borders. The left ventricular internal cavity size was normal in size. There is no left ventricular hypertrophy. Left ventricular diastolic parameters are consistent with Grade II diastolic dysfunction (pseudonormalization). Elevated left ventricular end-diastolic pressure. Right Ventricle: The right ventricular size is normal. No increase in right ventricular wall thickness. Right ventricular systolic function is normal. There is normal pulmonary artery systolic pressure. The tricuspid regurgitant velocity is 2.75 m/s, and  with an assumed right atrial pressure of 3 mmHg, the estimated right ventricular systolic pressure is 00.8 mmHg. Left Atrium: Left atrial size was normal in size. Right Atrium: Right atrial size was normal in size. Pericardium: There is no evidence of pericardial effusion. Mitral Valve: The mitral valve is degenerative in appearance. There is moderate thickening of the mitral valve leaflet(s). There is moderate calcification of the mitral valve leaflet(s). Moderate mitral annular calcification. Trivial mitral valve regurgitation. No evidence of mitral valve stenosis. Tricuspid Valve: The tricuspid valve is normal in structure. Tricuspid valve regurgitation is mild . No evidence of tricuspid stenosis. Aortic Valve: The aortic valve is tricuspid. Aortic valve regurgitation is not visualized. Mild to moderate aortic valve sclerosis/calcification is present, without any evidence of aortic stenosis. Pulmonic Valve: The pulmonic valve was normal in structure.  Pulmonic valve regurgitation is not visualized. No evidence of pulmonic stenosis. Aorta: The aortic root is normal in size and structure. Venous: The inferior vena  cava is normal in size with greater than 50% respiratory variability, suggesting right atrial pressure of 3 mmHg. IAS/Shunts: No atrial level shunt detected by color flow Doppler.  LEFT VENTRICLE PLAX 2D LVIDd:         5.80 cm  Diastology LVIDs:         4.10 cm  LV e' medial:    3.81 cm/s LV PW:         0.70 cm  LV E/e' medial:  24.3 LV IVS:        1.00 cm  LV e' lateral:   4.57 cm/s LVOT diam:     2.00 cm  LV E/e' lateral: 20.2 LV SV:         78 LV SV Index:   40 LVOT Area:     3.14 cm  RIGHT VENTRICLE RV S prime:     13.10 cm/s TAPSE (M-mode): 2.0 cm LEFT ATRIUM             Index       RIGHT ATRIUM           Index LA diam:        3.40 cm 1.74 cm/m  RA Area:     18.10 cm LA Vol (A2C):   56.0 ml 28.62 ml/m RA Volume:   47.40 ml  24.23 ml/m LA Vol (A4C):   51.1 ml 26.12 ml/m LA Biplane Vol: 53.4 ml 27.29 ml/m  AORTIC VALVE LVOT Vmax:   104.00 cm/s LVOT Vmean:  86.300 cm/s LVOT VTI:    0.249 m  AORTA Ao Root diam: 3.30 cm Ao Asc diam:  3.60 cm MITRAL VALVE                TRICUSPID VALVE MV Area (PHT): 2.48 cm     TR Peak grad:   30.2 mmHg MV Decel Time: 306 msec     TR Vmax:        275.00 cm/s MV E velocity: 92.40 cm/s MV A velocity: 138.00 cm/s  SHUNTS MV E/A ratio:  0.67         Systemic VTI:  0.25 m                             Systemic Diam: 2.00 cm Jenkins Rouge MD Electronically signed by Jenkins Rouge MD Signature Date/Time: 04/04/2020/11:23:04 AM    Final    VAS [place] LOWER EXTREMITY VENOUS (DVT)  Result Date: 04/13/2020  Lower Venous DVT Study Indications: Covid +, elevated D dimer.  Limitations: Body habitus, cooperation and poor ultrasound/tissue interface. Performing Technologist: Antonieta Pert RDMS, RVT  Examination Guidelines: A complete evaluation includes B-mode imaging, spectral Doppler, color Doppler, and power Doppler as needed  of all accessible portions of each vessel. Bilateral testing is considered an integral part of a complete examination. Limited examinations for reoccurring indications may be performed as noted. The reflux portion of the exam is performed with the patient in reverse Trendelenburg.  +---------+---------------+---------+-----------+----------+--------------+ RIGHT    CompressibilityPhasicitySpontaneityPropertiesThrombus Aging +---------+---------------+---------+-----------+----------+--------------+ CFV      Full           Yes      Yes                                 +---------+---------------+---------+-----------+----------+--------------+ SFJ      Full                                                        +---------+---------------+---------+-----------+----------+--------------+  FV Prox  Full                                                        +---------+---------------+---------+-----------+----------+--------------+ FV Mid   Full                                                        +---------+---------------+---------+-----------+----------+--------------+ FV DistalFull                                                        +---------+---------------+---------+-----------+----------+--------------+ PFV      Full                                                        +---------+---------------+---------+-----------+----------+--------------+ POP      Full           Yes      Yes                                 +---------+---------------+---------+-----------+----------+--------------+ PTV      Full                                                        +---------+---------------+---------+-----------+----------+--------------+ PERO     Partial                            dilated   Acute          +---------+---------------+---------+-----------+----------+--------------+ GSV      Full                                                         +---------+---------------+---------+-----------+----------+--------------+   +---------+---------------+---------+-----------+----------+--------------+ LEFT     CompressibilityPhasicitySpontaneityPropertiesThrombus Aging +---------+---------------+---------+-----------+----------+--------------+ CFV      Full           Yes      Yes                                 +---------+---------------+---------+-----------+----------+--------------+ SFJ      Full                                                        +---------+---------------+---------+-----------+----------+--------------+  FV Prox  Full                                                        +---------+---------------+---------+-----------+----------+--------------+ FV Mid   Full                                                        +---------+---------------+---------+-----------+----------+--------------+ FV DistalFull                                                        +---------+---------------+---------+-----------+----------+--------------+ PFV      Full                                                        +---------+---------------+---------+-----------+----------+--------------+ POP      Full           Yes      Yes                                 +---------+---------------+---------+-----------+----------+--------------+ PTV      Full                                                        +---------+---------------+---------+-----------+----------+--------------+ PERO     Full                                                        +---------+---------------+---------+-----------+----------+--------------+ GSV      Full                                                        +---------+---------------+---------+-----------+----------+--------------+     Summary: RIGHT: - Findings consistent with acute deep vein thrombosis involving the right peroneal veins. - No  cystic structure found in the popliteal fossa. - Small segment of thrombus in the right calf.  LEFT: - There is no evidence of deep vein thrombosis in the lower extremity. However, portions of this examination were limited- see technologist comments above.  - No cystic structure found in the popliteal fossa. - Difficult exam bilaterally due to large body habitus, poor penetration and poor patient cooperation.  *See table(s) above for measurements and observations. Electronically signed by Harold Barban MD on 04/13/2020 at 6:07:50 PM.    Final  PERTINENT LAB RESULTS: CBC: Recent Labs    04/20/20 0400 04/21/20 0228  WBC 4.0 3.6*  HGB 8.8* 8.6*  HCT 28.0* 26.2*  PLT 105* 92*   CMET CMP     Component Value Date/Time   NA 138 04/21/2020 0228   K 3.3 (L) 04/21/2020 0228   CL 103 04/21/2020 0228   CO2 26 04/21/2020 0228   GLUCOSE 155 (H) 04/21/2020 0228   BUN 17 04/21/2020 0228   CREATININE 3.14 (H) 04/21/2020 0228   CREATININE 1.53 (H) 09/28/2011 0945   CALCIUM 8.0 (L) 04/21/2020 0228   CALCIUM 9.6 11/01/2009 1543   PROT 4.6 (L) 04/18/2020 0432   ALBUMIN 2.4 (L) 04/21/2020 0228   AST 22 04/18/2020 0432   ALT 21 04/18/2020 0432   ALKPHOS 60 04/18/2020 0432   BILITOT 0.5 04/18/2020 0432   GFRNONAA 15 (L) 04/21/2020 0228   GFRNONAA 36 (L) 09/28/2011 0945   GFRAA 21 (L) 02/27/2019 0548   GFRAA 42 (L) 09/28/2011 0945    GFR Estimated Creatinine Clearance: 18.2 mL/min (A) (by C-G formula based on SCr of 3.14 mg/dL (H)). No results for input(s): LIPASE, AMYLASE in the last 72 hours. No results for input(s): CKTOTAL, CKMB, CKMBINDEX, TROPONINI in the last 72 hours. Invalid input(s): POCBNP No results for input(s): DDIMER in the last 72 hours. No results for input(s): HGBA1C in the last 72 hours. No results for input(s): CHOL, HDL, LDLCALC, TRIG, CHOLHDL, LDLDIRECT in the last 72 hours. No results for input(s): TSH, T4TOTAL, T3FREE, THYROIDAB in the last 72 hours.  Invalid  input(s): FREET3 No results for input(s): VITAMINB12, FOLATE, FERRITIN, TIBC, IRON, RETICCTPCT in the last 72 hours. Coags: No results for input(s): INR in the last 72 hours.  Invalid input(s): PT Microbiology: Recent Results (from the past 240 hour(s))  MRSA PCR Screening     Status: None   Collection Time: 04/11/20 12:56 PM   Specimen: Nasal Mucosa; Nasopharyngeal  Result Value Ref Range Status   MRSA by PCR NEGATIVE NEGATIVE Final    Comment:        The GeneXpert MRSA Assay (FDA approved for NASAL specimens only), is one component of a comprehensive MRSA colonization surveillance program. It is not intended to diagnose MRSA infection nor to guide or monitor treatment for MRSA infections. Performed at [facility], [place] [address], [place], [place] [address]     FURTHER DISCHARGE INSTRUCTIONS:  Get Medicines reviewed and adjusted: Please take all your medications with you for your next visit with your Primary MD  Laboratory/radiological data: Please request your Primary MD to go over all hospital tests and procedure/radiological results at the follow up, please ask your Primary MD to get all Hospital records sent to his/her office.  In some cases, they will be blood work, cultures and biopsy results pending at the time of your discharge. Please request that your primary care M.D. goes through all the records of your hospital data and follows up on these results.  Also Note the following: If you experience worsening of your admission symptoms, develop shortness of breath, life threatening emergency, suicidal or homicidal thoughts you must seek medical attention immediately by calling 911 or calling your MD immediately  if symptoms less severe.  You must read complete instructions/literature along with all the possible adverse reactions/side effects for all the Medicines you take and that have been prescribed to you. Take any new Medicines after you have completely  understood and accpet all the possible adverse reactions/side effects.  Do not drive when taking Pain medications or sleeping medications (Benzodaizepines)  Do not take more than prescribed Pain, Sleep and Anxiety Medications. It is not advisable to combine anxiety,sleep and pain medications without talking with your primary care practitioner  Special Instructions: If you have smoked or chewed Tobacco  in the last 2 yrs please stop smoking, stop any regular Alcohol  and or any Recreational drug use.  Wear Seat belts while driving.  Please note: You were cared for by a hospitalist during your hospital stay. Once you are discharged, your primary care physician will handle any further medical issues. Please note that NO REFILLS for any discharge medications will be authorized once you are discharged, as it is imperative that you return to your primary care physician (or establish a relationship with a primary care physician if you do not have one) for your post hospital discharge needs so that they can reassess your need for medications and monitor your lab values.  Total Time spent coordinating discharge including counseling, education and face to face time equals 35 minutes.  SignedOren Binet 04/21/2020 12:05 PM

## 2020-04-21 NOTE — Progress Notes (Signed)
Patient has left floor with transporter to IR.

## 2020-04-21 NOTE — TOC Progression Note (Signed)
Transition of Care [facility]) - Progression Note    Patient Details  Name: Ann [name] MRN: [id number] Date of Birth: 09/19/1950  Transition of Care [facility]) CM/SW [place], LCSW Phone Number: 04/21/2020, 9:06 AM  Clinical Narrative:    CSW received insurance approval for patient to go to [facility] when stable: Ref #[id number], Auth# [id number], effective 04/21/2020-04/26/2020.    Expected Discharge Plan: Skilled Nursing Facility Barriers to Discharge: SNF Pending bed offer,Insurance Authorization  Expected Discharge Plan and Services Expected Discharge Plan: [place] In-house Referral: Clinical Social Work   Post Acute Care Choice: [place] Living arrangements for the past 2 months: Apartment                                       Social Determinants of Health (SDOH) Interventions    Readmission Risk Interventions Readmission Risk Prevention Plan 02/23/2019  Transportation Screening Complete  PCP or Specialist Appt within 5-7 Days Complete  Home Care Screening Not Complete  Home Care Screening Not Completed Comments NA  Medication Review (RN CM) Not Complete  Med Review comments NA  Some recent data might be hidden

## 2020-04-21 NOTE — Progress Notes (Signed)
Patient consented to photography of gluteal wounds. Media uploaded to chart per [place] request.

## 2020-04-21 NOTE — Progress Notes (Addendum)
Renal Navigator spoke with Clinic Manager at [facility] to confirm that patient is accepted and can start in the [place] shift on Friday night, 04/23/20. She confirmed.  Patient will then start her normal MWF shift on Monday, 04/27/19, with a seat time of 11:40am. She needs to arrive at 11:20am. Navigator has updated Nephrologist and TOC CSW, who states patient will discharge to [facility] after catheter conversion today. Per CSW, patient's daughter is aware of discharge plan. Navigator has updated clinic and asked Renal PA to send outpatient HD orders.  Alphonzo Cruise, [place] Renal Navigator [phone]

## 2020-04-21 NOTE — Progress Notes (Signed)
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 The rising bun/creat in setting of covid-19 infection.   No sign of renal recovery.  The kidney ultrasound showed atrophied kidneys therefore she is now ESRD. Consulted IR to convert tunneled HD catheter placement. Last HD yesterday with 1 L UF, tolerated well.  Plan for next HD on Friday as she will be MWF schedule as outpatient.  She is already accepted at [facility].  Okay to discharge once she has tunneled catheter placement.  # Covid-19 infection- per primary team.   # Vascular access- RIJ temp HD catheter placed 04/11/20. Had been referred to [facility] for access placement as OP.  Plan for [facility] by IR.  Hold off on permanent access because of Covid positive.  # HTN/Volume-BP acceptable.  Volume management with dialysis.  # Anemia of CKD stage IV: Hemoglobin stable, continue ESA.  No iron because of Covid.Transfuse prn.  # CAD- stable  # Right peroneal DVT- started on Eliquis per primary.  #Hypokalemia: 4K bath during HD.  No need to replete.  Subjective: Seen and examined.  Seen and examined.  Feeling good.  Denies nausea vomiting chest pain shortness of breath.  Plan to place tunneled catheter by IR today.  Objective Vital signs in last 24 hours: Vitals:   04/20/20 2342 04/21/20 0423 04/21/20 0731 04/21/20 0745  BP: (!) 148/63 (!) 129/58 (!) 129/58 (!) 146/71  Pulse: 78 79 79 76  Resp: 15 14 14 15   Temp: 97.8 F (36.6 C) 98 F (36.7 C) 98 F (36.7 C) 98 F (36.7 C)  TempSrc: Oral Oral Oral Oral  SpO2: 98% 98%  100%  Weight:   97.9 kg   Height:   5' 2.01" (1.575 m)    Weight change:   Intake/Output Summary (Last 24 hours) at 04/21/2020 1125 Last data filed at 04/20/2020 1448 Gross per 24 hour  Intake 120 ml  Output --  Net 120 ml       Labs: Basic Metabolic Panel: Recent Labs  Lab  04/19/20 0259 04/20/20 0400 04/21/20 0228  NA 139 139 138  K 3.2* 3.2* 3.3*  CL 102 104 103  CO2 26 26 26   GLUCOSE 194* 104* 155*  BUN 35* 38* 17  CREATININE 4.42* 4.67* 3.14*  CALCIUM 8.1* 8.2* 8.0*  PHOS 3.5 4.1 2.7   Liver Function Tests: Recent Labs  Lab 04/15/20 0624 04/16/20 0214 04/18/20 0432 04/19/20 0259 04/20/20 0400 04/21/20 0228  AST 25 24 22   --   --   --   ALT 22 23 21   --   --   --   ALKPHOS 60 57 60  --   --   --   BILITOT 0.9 0.6 0.5  --   --   --   PROT 5.4* 5.1* 4.6*  --   --   --   ALBUMIN 2.5* 2.4* 2.4* 2.2* 2.3* 2.4*   No results for input(s): LIPASE, AMYLASE in the last 168 hours. No results for input(s): AMMONIA in the last 168 hours. CBC: Recent Labs  Lab 04/15/20 0624 04/16/20 0214 04/18/20 0432 04/19/20 0259 04/20/20 0400 04/21/20 0228  WBC 10.4 7.7 5.4 4.0 4.0 3.6*  NEUTROABS 8.9* 6.1  --   --   --   --   HGB 10.5* 10.6* 9.4* 8.7* 8.8* 8.6*  HCT 30.3* 31.9* 28.8* 27.7* 28.0* 26.2*  MCV 84.4 85.1  88.6 89.9 90.9 89.7  PLT 256 204 124* 115* 105* 92*   Cardiac Enzymes: No results for input(s): CKTOTAL, CKMB, CKMBINDEX, TROPONINI in the last 168 hours. CBG: Recent Labs  Lab 04/20/20 0726 04/20/20 1154 04/20/20 1620 04/20/20 2100 04/21/20 0748  GLUCAP 106* 97 182* 164* 106*    Iron Studies:  No results for input(s): IRON, TIBC, TRANSFERRIN, FERRITIN in the last 72 hours. Studies/Results: No results found.  Medications: Infusions: .  ceFAZolin (ANCEF) IV      Scheduled Medications: . sodium chloride   Intravenous Once  . amLODipine  5 mg Oral Daily  . apixaban  5 mg Oral BID  . vitamin C  500 mg Oral Daily  . Chlorhexidine Gluconate Cloth  6 each Topical Q0600  . Chlorhexidine Gluconate Cloth  6 each Topical Q0600  . Chlorhexidine Gluconate Cloth  6 each Topical Q0600  . darbepoetin (ARANESP) injection - DIALYSIS  60 mcg Intravenous Q Fri-HD  . hydrALAZINE  50 mg Oral TID  . insulin aspart  0-15 Units Subcutaneous  TID WC  . insulin aspart  0-5 Units Subcutaneous QHS  . insulin detemir  10 Units Subcutaneous Daily  . Ipratropium-Albuterol  1 puff Inhalation BID  . loratadine  10 mg Oral Daily  . metoprolol tartrate  25 mg Oral BID  . nystatin   Topical BID  . pantoprazole  20 mg Oral Daily  . pravastatin  40 mg Oral QPM  . rOPINIRole  0.25 mg Oral QPM    have reviewed scheduled and prn medications.  Physical Exam: General:NAD, comfortable Heart:RRR, s1s2 nl Lungs:clear b/l, no crackle Abdomen:soft, Non-tender, non-distended Extremities:No edema Dialysis Access: IJ temporary HD catheter  Kenton Fortin Prasad Meredith Kilbride 04/21/2020,11:25 AM  LOS: 10 days

## 2020-04-22 ENCOUNTER — Non-Acute Institutional Stay (SKILLED_NURSING_FACILITY): Payer: Medicare Other | Admitting: Internal Medicine

## 2020-04-22 DIAGNOSIS — E1165 Type 2 diabetes mellitus with hyperglycemia: Secondary | ICD-10-CM

## 2020-04-22 DIAGNOSIS — G4733 Obstructive sleep apnea (adult) (pediatric): Secondary | ICD-10-CM | POA: Insufficient documentation

## 2020-04-22 DIAGNOSIS — N184 Chronic kidney disease, stage 4 (severe): Secondary | ICD-10-CM | POA: Diagnosis not present

## 2020-04-22 DIAGNOSIS — D689 Coagulation defect, unspecified: Secondary | ICD-10-CM | POA: Insufficient documentation

## 2020-04-22 DIAGNOSIS — L299 Pruritus, unspecified: Secondary | ICD-10-CM | POA: Insufficient documentation

## 2020-04-22 DIAGNOSIS — T7840XA Allergy, unspecified, initial encounter: Secondary | ICD-10-CM | POA: Insufficient documentation

## 2020-04-22 DIAGNOSIS — R0602 Shortness of breath: Secondary | ICD-10-CM | POA: Insufficient documentation

## 2020-04-22 DIAGNOSIS — D631 Anemia in chronic kidney disease: Secondary | ICD-10-CM | POA: Insufficient documentation

## 2020-04-22 DIAGNOSIS — N2581 Secondary hyperparathyroidism of renal origin: Secondary | ICD-10-CM | POA: Insufficient documentation

## 2020-04-22 DIAGNOSIS — G9340 Encephalopathy, unspecified: Secondary | ICD-10-CM | POA: Diagnosis not present

## 2020-04-22 DIAGNOSIS — T782XXA Anaphylactic shock, unspecified, initial encounter: Secondary | ICD-10-CM | POA: Insufficient documentation

## 2020-04-22 DIAGNOSIS — U071 COVID-19: Secondary | ICD-10-CM | POA: Diagnosis not present

## 2020-04-22 DIAGNOSIS — T829XXA Unspecified complication of cardiac and vascular prosthetic device, implant and graft, initial encounter: Secondary | ICD-10-CM | POA: Insufficient documentation

## 2020-04-22 DIAGNOSIS — D649 Anemia, unspecified: Secondary | ICD-10-CM

## 2020-04-22 DIAGNOSIS — N186 End stage renal disease: Secondary | ICD-10-CM | POA: Insufficient documentation

## 2020-04-22 DIAGNOSIS — Z2839 Other underimmunization status: Secondary | ICD-10-CM | POA: Insufficient documentation

## 2020-04-22 DIAGNOSIS — J45998 Other asthma: Secondary | ICD-10-CM | POA: Insufficient documentation

## 2020-04-22 DIAGNOSIS — D509 Iron deficiency anemia, unspecified: Secondary | ICD-10-CM | POA: Insufficient documentation

## 2020-04-22 DIAGNOSIS — L89309 Pressure ulcer of unspecified buttock, unspecified stage: Secondary | ICD-10-CM | POA: Insufficient documentation

## 2020-04-22 DIAGNOSIS — L89329 Pressure ulcer of left buttock, unspecified stage: Secondary | ICD-10-CM

## 2020-04-22 NOTE — Patient Instructions (Signed)
See assessment and plan under each diagnosis in the problem list and acutely for this visit 

## 2020-04-22 NOTE — Assessment & Plan Note (Signed)
BS 210 this morning. Staff has been asked to report FBS or AC evening meal glucoses greater than 250 or less than 90.

## 2020-04-22 NOTE — Assessment & Plan Note (Addendum)
Hemodialysis as scheduled M, W, & F.

## 2020-04-22 NOTE — Assessment & Plan Note (Signed)
Wound care by Wound Care Nurse @ SNF

## 2020-04-22 NOTE — Assessment & Plan Note (Signed)
04/21/2020 H/H 8.6/26.2 with normocytic, normochromic indices. CBC will be monitored at SNF.

## 2020-04-22 NOTE — Progress Notes (Signed)
NURSING HOME LOCATION:  [facility] ROOM NUMBER:  302-A  CODE STATUS:  FULL CODE  PCP:  [name], MD  Ann [name] [id number]  This is a comprehensive admission note to [facility] performed on this date less than 30 days from date of admission. Included are preadmission medical/surgical history; reconciled medication list; family history; social history and comprehensive review of systems.  Corrections and additions to the records were documented. Comprehensive physical exam was also performed. Additionally a clinical summary was entered for each active diagnosis pertinent to this admission in the Problem List to enhance continuity of care.  HPI: The patient was hospitalized with COVID-19 pneumonia 12/19-12/29/2021. She presented to the ED with confusion and dyspnea.  Actually Covid + testing had occurred on 12/11 and she was hospitalized 12/11-12/17 mainly based on GI symptoms in the context of exposure to a family member who apparently had Covid.  She had not received the vaccine as she felt "it came out too fast".  As per protocol she received remdesivir during that admission , but she did not receive steroids as she was not hypoxic. SNF placement was recommended but she wanted to go home with her daughter. Post discharge she was increasingly confused; EMS documented her O2 sats as 70% on room air. EMS initiated NRB with O2 sats climbing to 100%. Chest x-ray revealed new mild airspace opacity in the central right upper lobe and peripheral left upper lobe suspicious for viral pneumonia. Admission labs revealed significant anemia with hemoglobin 9/hematocrit 29.7. BUN was 103, creatinine 8.99 (up from 6.26 on 12/17), estimated GFR 4, D-dimer 6, CRP 20.6, AST 52, albumin 2.3, and total protein 6.8. IV fluids were initiated along with Decadron.  Course was complicated by DVT in the right lower extremity for which Eliquis was initiated and was to be continued  for 3 months.  Nephrology consulted and initiated hemodialysis. Tunneled hemodialysis catheter was placed 12/29. Placement was associated with transitory bradycardia which transiently delayed her discharge. MASD/IAD full-thickness wounds were present over the right gluteal fold and a partial-thickness wound over the left gluteal fold. Wound care was initiated with discharge order for continuing protocol at the SNF.  Past medical and surgical history: Includes history of asthma, essential hypertension, OSA on CPAP, RLS, dyslipidemia, chronic low back pain, morbid obesity and CAD. Surgeries and procedures include abdominal hysterectomy, coronary artery stenting, TKA, and cholecystectomy.  Social history: The patient stated she did try to smoke as a teen but could not tolerate it.  Also she has never drunk alcohol but has been a lifelong snuff user.  She is a retired [employer].  Family history: Family history is strongly positive for heart disease.   Review of systems: She has a excellent historian, alert and communicative.  She is a very delightful individual.  She stated that she felt "wonderful" and had no complaints.  She denies any cardiorespiratory symptoms.  Despite the ESRD she denies oliguria.  Despite the anemia she has no bleeding dyscrasias.  She does validate that she snores and has sleep apnea.  She has some tenderness over the left anterior chest at the site of the HD catheter placement.  Constitutional: No fever, significant weight change, fatigue  Eyes: No redness, discharge, pain, vision change ENT/mouth: No nasal congestion, purulent discharge, earache, change in hearing, sore throat  Cardiovascular: No chest pain, palpitations, paroxysmal nocturnal dyspnea, claudication, edema  Respiratory: No cough, sputum production, hemoptysis, DOE  Gastrointestinal: No heartburn, dysphagia, abdominal pain, nausea /  vomiting, rectal bleeding, melena, change in bowels Genitourinary: No dysuria,  hematuria, pyuria, incontinence, nocturia Musculoskeletal: No joint stiffness, joint swelling, weakness, pain Dermatologic: No rash, pruritus, change in appearance of skin Neurologic: No dizziness, headache, syncope, seizures, numbness, tingling Psychiatric: No significant anxiety, depression, insomnia, anorexia Endocrine: No change in hair/skin/nails, excessive thirst, excessive hunger, excessive urination  Hematologic/lymphatic: No significant bruising, lymphadenopathy, abnormal bleeding Allergy/immunology: No itchy/watery eyes, significant sneezing, urticaria, angioedema  Physical exam:  Pertinent or positive findings: As noted she is alert and oriented.  She is missing several teeth and there is dental staining.  Heart sounds are distant and slight tach was suggested.  Breath sounds are also decreased with minimal rhonchi.  She was tender @ catheter site when I attempted to auscultate the left anterior chest.  Abdomen is protuberant.  Pedal pulses are decreased.  General appearance: Adequately nourished; no acute distress, increased work of breathing is present.   Lymphatic: No lymphadenopathy about the head, neck, axilla. Eyes: No conjunctival inflammation or lid. Is present. There is no scleral icterus. Ears:  External ear exam shows no significant lesions or deformities.   Nose:  External nasal examination shows no deformity or inflammation. Nasal mucosa are pink and moist without lesions, exudates Oral exam: Lips and gums are healthy appearing.There is no oropharyngeal erythema or exudate. Neck:  No thyromegaly, masses, tenderness noted.    Heart:  No gallop, murmur, click, rub.  Lungs:  without wheezes, rales, rubs. Abdomen: Bowel sounds are normal.  Abdomen is soft and nontender with no organomegaly, hernias, masses. GU: Deferred  Extremities:  No cyanosis, clubbing, edema. Neurologic exam:  Strength equal  in upper & lower extremities. Balance, Rhomberg, finger to nose testing  could not be completed due to clinical state Skin: Warm & dry w/o tenting. No significant lesions or rash.  See clinical summary under each active problem in the Problem List with associated updated therapeutic plan

## 2020-04-22 NOTE — Assessment & Plan Note (Signed)
Patient is presently alert and oriented and an excellent historian.  No evidence of residual neurocognitive deficit.

## 2020-04-22 NOTE — Assessment & Plan Note (Signed)
Mild rhonchi on auscultation of the chest without evidence of respiratory distress.  Adequate saturations on supplemental oxygen.

## 2020-05-04 ENCOUNTER — Encounter: Payer: Self-pay | Admitting: Adult Health

## 2020-05-04 ENCOUNTER — Non-Acute Institutional Stay (SKILLED_NURSING_FACILITY): Payer: Medicare Other | Admitting: Adult Health

## 2020-05-04 DIAGNOSIS — E782 Mixed hyperlipidemia: Secondary | ICD-10-CM

## 2020-05-04 DIAGNOSIS — E1122 Type 2 diabetes mellitus with diabetic chronic kidney disease: Secondary | ICD-10-CM

## 2020-05-04 DIAGNOSIS — U071 COVID-19: Secondary | ICD-10-CM | POA: Diagnosis not present

## 2020-05-04 DIAGNOSIS — G894 Chronic pain syndrome: Secondary | ICD-10-CM

## 2020-05-04 DIAGNOSIS — K219 Gastro-esophageal reflux disease without esophagitis: Secondary | ICD-10-CM

## 2020-05-04 DIAGNOSIS — N186 End stage renal disease: Secondary | ICD-10-CM | POA: Diagnosis not present

## 2020-05-04 DIAGNOSIS — I150 Renovascular hypertension: Secondary | ICD-10-CM

## 2020-05-04 DIAGNOSIS — I824Y1 Acute embolism and thrombosis of unspecified deep veins of right proximal lower extremity: Secondary | ICD-10-CM | POA: Diagnosis not present

## 2020-05-04 MED ORDER — INSULIN LISPRO 100 UNIT/ML ~~LOC~~ SOLN: 6.0000 [IU] | Freq: Two times a day (BID) | SUBCUTANEOUS | 0 refills | Status: DC

## 2020-05-04 MED ORDER — PANTOPRAZOLE SODIUM 20 MG PO TBEC: 20.0000 mg | DELAYED_RELEASE_TABLET | Freq: Every day | ORAL | 0 refills | Status: DC

## 2020-05-04 MED ORDER — ALBUTEROL SULFATE HFA 108 (90 BASE) MCG/ACT IN AERS: 2.0000 | INHALATION_SPRAY | RESPIRATORY_TRACT | 0 refills | Status: AC | PRN

## 2020-05-04 MED ORDER — HYDRALAZINE HCL 50 MG PO TABS: 50.0000 mg | ORAL_TABLET | Freq: Three times a day (TID) | ORAL | 0 refills | Status: AC

## 2020-05-04 MED ORDER — ROPINIROLE HCL 0.25 MG PO TABS
0.2500 mg | ORAL_TABLET | Freq: Every evening | ORAL | 0 refills | Status: DC
Start: 2020-05-04 — End: 2020-10-12

## 2020-05-04 MED ORDER — GABAPENTIN 100 MG PO CAPS: 100.0000 mg | ORAL_CAPSULE | Freq: Every day | ORAL | 0 refills | Status: DC

## 2020-05-04 MED ORDER — AMLODIPINE BESYLATE 5 MG PO TABS
5.0000 mg | ORAL_TABLET | Freq: Every day | ORAL | 0 refills | Status: DC
Start: 2020-05-04 — End: 2020-08-11

## 2020-05-04 MED ORDER — DULOXETINE HCL 60 MG PO CPEP: 60.0000 mg | ORAL_CAPSULE | Freq: Every day | ORAL | 0 refills | Status: DC

## 2020-05-04 MED ORDER — PRAVASTATIN SODIUM 40 MG PO TABS: 40.0000 mg | ORAL_TABLET | Freq: Every evening | ORAL | 0 refills | Status: DC

## 2020-05-04 MED ORDER — TOUJEO SOLOSTAR 300 UNIT/ML ~~LOC~~ SOPN: 6.0000 [IU] | PEN_INJECTOR | Freq: Every day | SUBCUTANEOUS | 0 refills | Status: DC

## 2020-05-04 MED ORDER — METOPROLOL TARTRATE 50 MG PO TABS: 25.0000 mg | ORAL_TABLET | Freq: Two times a day (BID) | ORAL | 0 refills | Status: DC

## 2020-05-04 MED ORDER — APIXABAN 5 MG PO TABS: 5.0000 mg | ORAL_TABLET | Freq: Two times a day (BID) | ORAL | 0 refills | Status: AC

## 2020-05-04 NOTE — Progress Notes (Signed)
Location:  [facility] Room Number: 306-A Place of Service:  SNF (31) Provider:  Durenda Age, DNP, FNP-BC  Patient Care Team: [name], MD as PCP - General (Internal Medicine) Juluis Mire, MD (Inactive) as PCP - Internal Medicine [place] as Consulting Physician (Ophthalmology)  Extended Emergency Contact Information Primary Emergency Contact: Roselind Messier of [place] Phone: [phone] Mobile Phone: [phone] Relation: Daughter Secondary Emergency Contact: Jinger Neighbors [place] of [place] Phone: [phone] Mobile Phone: [phone] Relation: Nephew  Code Status:  FULL CODE   Goals of care: Advanced Directive information Advanced Directives 04/20/2020  Does Patient Have a Medical Advance Directive? -  Would patient like information on creating a medical advance directive? No - Patient declined  Pre-existing out of facility DNR order (yellow form or pink MOST form) -     Chief Complaint  Patient presents with  . Discharge Note    Patient is seen for discharge from SNF.     HPI:  Pt is a 70 y.o. female who is for discharge home on 05/06/20 with Home health PT and OT.  She was admitted to [facility] on 04/21/20 post [facility] hospitalization 04/11/20 to 04/21/20 for acute hypoxic respiratory failure secondary to COVID-19 pneumonia.  She has a PMH of stage IV CKD, anemia, CAD, diabetes mellitus type 2, hypertension, OSA on CPAP and chronic back pain.  She was having poor oral intake, fatigue, pneumonia, worsening AKI on CKD stage IV, acute metabolic encephalopathy due to uremia.  She was started on hemodialysis. She is unvaccinated and was treated with steroids and 1 dose of Actemra. She was given Rocephin and Remdesevir.  She has clinically improved and now on room air. She was also found to have acute right Lower extremity DVT and was started on Eliquis.  Patient was  admitted to this facility for short-term rehabilitation after the patient's recent hospitalization.  Patient has completed SNF rehabilitation and therapy has cleared the patient for discharge.    Past Medical History:  Diagnosis Date  . Anemia 09/29/2008   anemia of chronic disease  . Anginal pain ([place])    no recent chest pain  . Arthritis   . Asthma 09/29/2008  . CAD (coronary artery disease) 09/29/2008   hx  PCI - [facility]. Stress test 10/2011 nl with EF 58%. [place] card., last cath 2010  . Chest pain 02/16/2009  . CKD (chronic kidney disease), stage IV ([place]) 10/20/2009  . GERD (gastroesophageal reflux disease)   . Hyperlipidemia 10/21/2009  . Hypertension 09/29/2008  . Insulin dependent diabetes mellitus type IA ([place]) 09/29/2008  . Lumbar back pain 02/16/2009   states pain goes down right leg  . Myalgia 02/16/2009  . Shortness of breath    with exerction  . Shoulder pain, left 03/16/2009   occurs at night  . Sleep apnea    CPAP   Past Surgical History:  Procedure Laterality Date  . ABDOMINAL HYSTERECTOMY    . APPENDECTOMY    . CARDIAC CATHETERIZATION  2008   in [place], [place] stable  . CARDIAC CATHETERIZATION  2010   patent stents in mid and distal RCA; high grade disease in prox & mid AV groove LCX& OM; mod disease in prox & mid LAD normal LV FUNCTION--TREATED MEDICALLY    . CATARACT EXTRACTION, BILATERAL    . CHOLECYSTECTOMY    . CORONARY ANGIOPLASTY WITH STENT PLACEMENT  2005   2 vessel PCI at [place].  . DOPPLER  ECHOCARDIOGRAPHY  1998   EF 60%  . IR FLUORO GUIDE CV LINE LEFT  04/21/2020  . IR US GUIDE VASC ACCESS LEFT  04/21/2020  . TOTAL KNEE ARTHROPLASTY  12/2011   Left  . TOTAL KNEE ARTHROPLASTY  11/28/2011   Procedure: TOTAL KNEE ARTHROPLASTY;  Surgeon: Hessie Dibble, MD;  Location: [facility];  Service: Orthopedics;  Laterality: Right;    Allergies  Allergen Reactions  . Codeine Nausea And Vomiting  . Metronidazole Itching    Outpatient  Encounter Medications as of 05/04/2020  Medication Sig  . ACCU-CHEK FASTCLIX LANCETS MISC USE AS DIRECTED TO CHECK BLOOD SUGAR FOUR TIMES A DAY(BEFORE MEALS AND AT BEDTIME). (Patient not taking: Reported on 04/22/2020)  . ACCU-CHEK SMARTVIEW test strip CHECK BLOOD SUGAR BEFORE MEALS AND AT BEDTIME (Patient not taking: No sig reported)  . albuterol (VENTOLIN HFA) 108 (90 Base) MCG/ACT inhaler Inhale 2 puffs into the lungs every 4 (four) hours as needed for wheezing or shortness of breath.  Marland Kitchen amLODipine (NORVASC) 5 MG tablet Take 1 tablet (5 mg total) by mouth daily.  Marland Kitchen apixaban (ELIQUIS) 5 MG TABS tablet Take 1 tablet (5 mg total) by mouth 2 (two) times daily.  . DULoxetine (CYMBALTA) 60 MG capsule Take 60 mg by mouth daily.  Marland Kitchen gabapentin (NEURONTIN) 100 MG capsule Take 1 capsule (100 mg total) by mouth at bedtime.  . hydrALAZINE (APRESOLINE) 50 MG tablet Take 50 mg by mouth 3 (three) times daily.  . insulin lispro (HUMALOG) 100 UNIT/ML injection Inject 6 Units into the skin 2 (two) times daily before a meal. Breakfast and evening meal  . Insulin Pen Needle (EASY TOUCH PEN NEEDLES) 31G X 8 MM MISC Use to inject insulin as instructed. (Patient not taking: Reported on 04/22/2020)  . metoprolol tartrate (LOPRESSOR) 50 MG tablet Take 0.5 tablets (25 mg total) by mouth 2 (two) times daily.  . pantoprazole (PROTONIX) 20 MG tablet TAKE 1 TABLET BY MOUTH DAILY  . pravastatin (PRAVACHOL) 40 MG tablet TAKE ONE TABLET BY MOUTH EVERY EVENING  . rOPINIRole (REQUIP) 0.25 MG tablet Take 0.25 mg by mouth every evening.  Marland Kitchen TOUJEO SOLOSTAR 300 UNIT/ML Solostar Pen Inject 6 Units into the skin at bedtime.   No facility-administered encounter medications on file as of 05/04/2020.    Review of Systems  GENERAL: No change in appetite, no fatigue, no weight changes, no fever, chills or weakness MOUTH and THROAT: Denies oral discomfort, gingival pain or bleeding RESPIRATORY: no cough, SOB, DOE, wheezing,  hemoptysis CARDIAC: No chest pain, edema or palpitations GI: No abdominal pain, diarrhea, constipation, heart burn, nausea or vomiting GU: Denies dysuria, frequency, hematuria, incontinence, or discharge NEUROLOGICAL: Denies dizziness, syncope, numbness, or headache PSYCHIATRIC: Denies feelings of depression or anxiety. No report of hallucinations, insomnia, paranoia, or agitation   Immunization History  Administered Date(s) Administered  . Influenza Split 02/21/2011, 03/25/2012  . Influenza Whole 03/16/2009, 12/20/2009  . Pneumococcal Polysaccharide-23 09/29/2008   Pertinent  Health Maintenance Due  Topic Date Due  . FOOT EXAM  10/16/2012  . OPHTHALMOLOGY EXAM  10/23/2012  . LIPID PANEL  01/16/2013  . DEXA SCAN  Never done  . PNA vac Low Risk Adult (1 of 2 - PCV13) 05/08/2014  . MAMMOGRAM  08/24/2019  . INFLUENZA VACCINE  11/23/2019  . COLONOSCOPY (Pts 45-49yr Insurance coverage will need to be confirmed)  01/19/2020  . HEMOGLOBIN A1C  07/03/2020   Fall Risk  03/25/2012 01/17/2012  Falls in the past year? No -  Risk for fall due to : - Impaired balance/gait     Vitals:   05/04/20 0942  BP: 95/73  Pulse: 82  Resp: 20  Temp: (!) 97.3 F (36.3 C)  TempSrc: Oral  Weight: 223 lb 11.8 oz (101.5 kg)  Height: 5' 1"  (1.549 m)   Body mass index is 42.28 kg/m.  Physical Exam  GENERAL APPEARANCE: Well nourished. In no acute distress. Morbidly obese. SKIN:  Skin is warm and dry.  MOUTH and THROAT: Lips are without lesions. Oral mucosa is moist and without lesions. Tongue is normal in shape, size, and color and without lesions RESPIRATORY: Breathing is even & unlabored, BS CTAB CARDIAC: RRR, no murmur,no extra heart sounds, no edema. Hemodialysis catheter on left chest. GI: Abdomen soft, normal BS, no masses, no tenderness EXTREMITIES:  Able to move X 4 extremities NEUROLOGICAL: There is no tremor. Speech is clear. Alert and oriented X 3. PSYCHIATRIC:  Affect and behavior  are appropriate  Labs reviewed: Recent Labs    04/05/20 0352 04/06/20 0343 04/07/20 0549 04/12/20 0416 04/13/20 0839 04/19/20 0259 04/20/20 0400 04/21/20 0228  NA 141 141   < > 142   < > 139 139 138  K 3.9 3.5   < > 5.0   < > 3.2* 3.2* 3.3*  CL 106 107   < > 110   < > 102 104 103  CO2 18* 20*   < > 15*   < > 26 26 26   GLUCOSE 109* 89   < > 212*   < > 194* 104* 155*  BUN 82* 88*   < > 111*   < > 35* 38* 17  CREATININE 6.03* 6.54*   < > 9.47*   < > 4.42* 4.67* 3.14*  CALCIUM 8.6* 8.3*   < > 8.7*   < > 8.1* 8.2* 8.0*  MG 2.2 2.4  --  2.4  --   --   --   --   PHOS 6.1* 6.7*   < > 6.6*  --  3.5 4.1 2.7   < > = values in this interval not displayed.   Recent Labs    04/15/20 0624 04/16/20 0214 04/18/20 0432 04/19/20 0259 04/20/20 0400 04/21/20 0228  AST 25 24 22   --   --   --   ALT 22 23 21   --   --   --   ALKPHOS 60 57 60  --   --   --   BILITOT 0.9 0.6 0.5  --   --   --   PROT 5.4* 5.1* 4.6*  --   --   --   ALBUMIN 2.5* 2.4* 2.4* 2.2* 2.3* 2.4*   Recent Labs    04/14/20 0145 04/15/20 0624 04/16/20 0214 04/18/20 0432 04/19/20 0259 04/20/20 0400 04/21/20 0228  WBC 9.7 10.4 7.7   < > 4.0 4.0 3.6*  NEUTROABS 8.7* 8.9* 6.1  --   --   --   --   HGB 10.7* 10.5* 10.6*   < > 8.7* 8.8* 8.6*  HCT 32.5* 30.3* 31.9*   < > 27.7* 28.0* 26.2*  MCV 88.6 84.4 85.1   < > 89.9 90.9 89.7  PLT 275 256 204   < > 115* 105* 92*   < > = values in this interval not displayed.   Lab Results  Component Value Date   TSH 1.010 02/21/2019   Lab Results  Component Value Date   HGBA1C 6.6 (H) 04/04/2020   Lab  Results  Component Value Date   CHOL 149 01/17/2012   HDL 35 (L) 01/17/2012   LDLCALC 86 01/17/2012   TRIG 134 04/11/2020   CHOLHDL 4.3 01/17/2012    Significant Diagnostic Results in last 30 days:  CT HEAD WO CONTRAST  Result Date: 04/14/2020 CLINICAL DATA:  Delirium.  Altered mental status. EXAM: CT HEAD WITHOUT CONTRAST TECHNIQUE: Contiguous axial images were obtained  from the base of the skull through the vertex without intravenous contrast. COMPARISON:  April 03, 2020. FINDINGS: Brain: No evidence of acute large vascular territory infarction, hemorrhage, hydrocephalus, extra-axial collection or mass lesion/mass effect. Similar cerebral atrophy with ex vacuo ventricular dilation. Mild white matter hypodensities, likely related to chronic microvascular ischemic disease. Vascular: Calcific atherosclerosis. Skull: No acute fracture. Sinuses/Orbits: Left maxillary sinus retention cyst versus polyp. Unremarkable orbits. Other: No mastoid effusions. IMPRESSION: 1. No evidence of acute intracranial abnormality. 2. Similar cerebral atrophy and ventriculomegaly. Electronically Signed   By: Margaretha Sheffield MD   On: 04/14/2020 16:08   US RENAL  Result Date: 04/04/2020 CLINICAL DATA:  Acute kidney injury EXAM: RENAL / URINARY TRACT ULTRASOUND COMPLETE COMPARISON:  CT 04/03/2020 FINDINGS: Technical note: Significantly limited examination secondary to poor penetration related to patient body habitus and shadowing from overlying bowel gas. Right Kidney: Limited. Echogenic renal parenchyma with cortical thinning compatible with chronic medical renal disease. Right renal length of approximately 10.6 cm. No shadowing stone or hydronephrosis is identified. Left Kidney: Very limited evaluation. Visualized left kidney appears diffusely echogenic with renal cortical thinning compatible with chronic medical renal disease. No obvious hydronephrosis. Bladder: Appears normal for degree of bladder distention. Other: None. IMPRESSION: 1. Limited exam. 2. No sonographic evidence of obstructive uropathy. 3. Atrophic, echogenic kidneys compatible with chronic medical renal disease. Electronically Signed   By: Davina Poke D.O.   On: 04/04/2020 12:18   IR Fluoro Guide CV Line Left  Result Date: 04/22/2020 INDICATION: End-stage renal disease. In need of durable intravenous access for  continuation of hemodialysis. EXAM: 1. TUNNELED CENTRAL VENOUS HEMODIALYSIS CATHETER PLACEMENT WITH ULTRASOUND AND FLUOROSCOPIC GUIDANCE 2. BEDSIDE REMOVAL OF NON TUNNELED RIGHT INTERNAL JUGULAR APPROACH HEMODIALYSIS CATHETER. MEDICATIONS: Ancef 2 gm IV . The antibiotic was given in an appropriate time interval prior to skin puncture. ANESTHESIA/SEDATION: Moderate (conscious) sedation was employed during this procedure. A total of Versed 1.5 mg and Fentanyl 50 mcg was administered intravenously. Moderate Sedation Time: 16 minutes. The patient's level of consciousness and vital signs were monitored continuously by radiology nursing throughout the procedure under my direct supervision. FLUOROSCOPY TIME:  1 minute, 12 seconds (36 mGy) COMPLICATIONS: None immediate. PROCEDURE: Informed written consent was obtained from the patient after a discussion of the risks, benefits, and alternatives to treatment. Questions regarding the procedure were encouraged and answered. The patient's pre-existing non tunneled right jugular approach dialysis catheter serves as the patient's only source of intravenous access and as such we will be utilized for conscious sedation for tunneled hemodialysis catheter placement. As such, the left neck and chest were prepped with chlorhexidine in a sterile fashion, and a sterile drape was applied covering the operative field. Maximum barrier sterile technique with sterile gowns and gloves were used for the procedure. A timeout was performed prior to the initiation of the procedure. After creating a small venotomy incision, a micropuncture kit was utilized to access the internal jugular vein. Real-time ultrasound guidance was utilized for vascular access including the acquisition of a permanent ultrasound image documenting patency of the accessed vessel. The  microwire was utilized to measure appropriate catheter length. A stiff Glidewire was advanced to the level of the IVC and the micropuncture  sheath was exchanged for a peel-away sheath. A palindrome tunneled hemodialysis catheter measuring 23 cm from tip to cuff was tunneled in a retrograde fashion from the anterior chest wall to the venotomy incision. The catheter was then placed through the peel-away sheath with tips ultimately positioned within the superior aspect of the right atrium. Final catheter positioning was confirmed and documented with a spot radiographic image. The catheter aspirates and flushes normally. The catheter was flushed with appropriate volume heparin dwells. The catheter exit site was secured with a 0-Prolene retention suture. The venotomy incision was closed with Dermabond and Steri-strips. Dressings were applied. Next, the non tunneled temporary dialysis catheter was removed and hemostasis was achieved with manual compression. The patient tolerated the procedure well without immediate post procedural complication. IMPRESSION: 1. Successful placement of 23 cm tip to cuff tunneled hemodialysis catheter via the left internal jugular vein with tips terminating within the superior aspect of the right atrium. The catheter is ready for immediate use. 2. Successful bedside removal of non tunneled right jugular approach temporary dialysis catheter. Electronically Signed   By: Sandi Mariscal M.D.   On: 04/22/2020 07:51   IR US Guide Vasc Access Left  Result Date: 04/22/2020 INDICATION: End-stage renal disease. In need of durable intravenous access for continuation of hemodialysis. EXAM: 1. TUNNELED CENTRAL VENOUS HEMODIALYSIS CATHETER PLACEMENT WITH ULTRASOUND AND FLUOROSCOPIC GUIDANCE 2. BEDSIDE REMOVAL OF NON TUNNELED RIGHT INTERNAL JUGULAR APPROACH HEMODIALYSIS CATHETER. MEDICATIONS: Ancef 2 gm IV . The antibiotic was given in an appropriate time interval prior to skin puncture. ANESTHESIA/SEDATION: Moderate (conscious) sedation was employed during this procedure. A total of Versed 1.5 mg and Fentanyl 50 mcg was administered  intravenously. Moderate Sedation Time: 16 minutes. The patient's level of consciousness and vital signs were monitored continuously by radiology nursing throughout the procedure under my direct supervision. FLUOROSCOPY TIME:  1 minute, 12 seconds (36 mGy) COMPLICATIONS: None immediate. PROCEDURE: Informed written consent was obtained from the patient after a discussion of the risks, benefits, and alternatives to treatment. Questions regarding the procedure were encouraged and answered. The patient's pre-existing non tunneled right jugular approach dialysis catheter serves as the patient's only source of intravenous access and as such we will be utilized for conscious sedation for tunneled hemodialysis catheter placement. As such, the left neck and chest were prepped with chlorhexidine in a sterile fashion, and a sterile drape was applied covering the operative field. Maximum barrier sterile technique with sterile gowns and gloves were used for the procedure. A timeout was performed prior to the initiation of the procedure. After creating a small venotomy incision, a micropuncture kit was utilized to access the internal jugular vein. Real-time ultrasound guidance was utilized for vascular access including the acquisition of a permanent ultrasound image documenting patency of the accessed vessel. The microwire was utilized to measure appropriate catheter length. A stiff Glidewire was advanced to the level of the IVC and the micropuncture sheath was exchanged for a peel-away sheath. A palindrome tunneled hemodialysis catheter measuring 23 cm from tip to cuff was tunneled in a retrograde fashion from the anterior chest wall to the venotomy incision. The catheter was then placed through the peel-away sheath with tips ultimately positioned within the superior aspect of the right atrium. Final catheter positioning was confirmed and documented with a spot radiographic image. The catheter aspirates and flushes normally. The  catheter  was flushed with appropriate volume heparin dwells. The catheter exit site was secured with a 0-Prolene retention suture. The venotomy incision was closed with Dermabond and Steri-strips. Dressings were applied. Next, the non tunneled temporary dialysis catheter was removed and hemostasis was achieved with manual compression. The patient tolerated the procedure well without immediate post procedural complication. IMPRESSION: 1. Successful placement of 23 cm tip to cuff tunneled hemodialysis catheter via the left internal jugular vein with tips terminating within the superior aspect of the right atrium. The catheter is ready for immediate use. 2. Successful bedside removal of non tunneled right jugular approach temporary dialysis catheter. Electronically Signed   By: Sandi Mariscal M.D.   On: 04/22/2020 07:51   DG CHEST PORT 1 VIEW  Result Date: 04/11/2020 CLINICAL DATA:  Hemodialysis catheter placement EXAM: PORTABLE CHEST 1 VIEW COMPARISON:  April 11, 2020 FINDINGS: The heart size is enlarged. Bilateral airspace disease is again noted and has progressed in the right mid and right upper lung zones. There is vascular congestion with small bilateral pleural effusions. There is a right-sided central venous catheter with tip projecting over the mid SVC. The catheter fall short of the cavoatrial junction by approximately 6-7 cm. There is no evidence for pneumothorax. Aortic calcifications are noted. The main pulmonary artery is dilated. IMPRESSION: 1. Interval worsening of bilateral airspace disease, greatest in the right mid and right upper lung zones. 2. Hemodialysis catheter as above. While the tip likely terminates in the SVC, a catheter fall short of the cavoatrial junction by at least 6 cm. 3. Persistent cardiomegaly with vascular congestion and small bilateral pleural effusions. Electronically Signed   By: Constance Holster M.D.   On: 04/11/2020 17:23   DG Chest Port 1 View  Result Date:  04/11/2020 CLINICAL DATA:  Shortness of breath. Cough. Hypoxia. COVID-19 virus infection. EXAM: PORTABLE CHEST 1 VIEW COMPARISON:  04/03/2020 FINDINGS: Heart size remains normal. New mild airspace opacity is seen in the central right upper lobe and peripheral left upper lobe. No evidence of pleural effusion. IMPRESSION: New mild bilateral upper lobe airspace disease, suspicious for viral pneumonia. Electronically Signed   By: Marlaine Hind M.D.   On: 04/11/2020 11:30   ECHOCARDIOGRAM COMPLETE  Result Date: 04/04/2020    ECHOCARDIOGRAM REPORT   Patient Name:   Ann [name] Yi Date of Exam: 04/04/2020 Medical Rec #:  [id number]     Height:       62.0 in Accession #:    [id number]    Weight:       211.2 lb Date of Birth:  Oct 22, 1950     BSA:          1.956 m Patient Age:    74 years      BP:           146/78 mmHg Patient Gender: F             HR:           82 bpm. Exam Location:  Inpatient Procedure: 2D Echo, Cardiac Doppler, Color Doppler and Intracardiac            Opacification Agent Indications:    Abnormal ECG 794.31 / R94.31  History:        Patient has prior history of Echocardiogram examinations, most                 recent 11/11/1996. CAD, Signs/Symptoms:Shortness of Breath and  Chest Pain; Risk Factors:Hypertension, Diabetes and                 Dyslipidemia.  Sonographer:    Bernadene Person RDCS Referring Phys: [id number] [place]  1. Left ventricular ejection fraction, by estimation, is 60 to 65%. The left ventricle has normal function. The left ventricle has no regional wall motion abnormalities. Left ventricular diastolic parameters are consistent with Grade II diastolic dysfunction (pseudonormalization). Elevated left ventricular end-diastolic pressure.  2. Right ventricular systolic function is normal. The right ventricular size is normal. There is normal pulmonary artery systolic pressure.  3. The mitral valve is degenerative. Trivial mitral valve regurgitation. No  evidence of mitral stenosis. Moderate mitral annular calcification.  4. The aortic valve is tricuspid. Aortic valve regurgitation is not visualized. Mild to moderate aortic valve sclerosis/calcification is present, without any evidence of aortic stenosis.  5. The inferior vena cava is normal in size with greater than 50% respiratory variability, suggesting right atrial pressure of 3 mmHg. FINDINGS  Left Ventricle: Left ventricular ejection fraction, by estimation, is 60 to 65%. The left ventricle has normal function. The left ventricle has no regional wall motion abnormalities. Definity contrast agent was given IV to delineate the left ventricular  endocardial borders. The left ventricular internal cavity size was normal in size. There is no left ventricular hypertrophy. Left ventricular diastolic parameters are consistent with Grade II diastolic dysfunction (pseudonormalization). Elevated left ventricular end-diastolic pressure. Right Ventricle: The right ventricular size is normal. No increase in right ventricular wall thickness. Right ventricular systolic function is normal. There is normal pulmonary artery systolic pressure. The tricuspid regurgitant velocity is 2.75 m/s, and  with an assumed right atrial pressure of 3 mmHg, the estimated right ventricular systolic pressure is 48.2 mmHg. Left Atrium: Left atrial size was normal in size. Right Atrium: Right atrial size was normal in size. Pericardium: There is no evidence of pericardial effusion. Mitral Valve: The mitral valve is degenerative in appearance. There is moderate thickening of the mitral valve leaflet(s). There is moderate calcification of the mitral valve leaflet(s). Moderate mitral annular calcification. Trivial mitral valve regurgitation. No evidence of mitral valve stenosis. Tricuspid Valve: The tricuspid valve is normal in structure. Tricuspid valve regurgitation is mild . No evidence of tricuspid stenosis. Aortic Valve: The aortic valve is  tricuspid. Aortic valve regurgitation is not visualized. Mild to moderate aortic valve sclerosis/calcification is present, without any evidence of aortic stenosis. Pulmonic Valve: The pulmonic valve was normal in structure. Pulmonic valve regurgitation is not visualized. No evidence of pulmonic stenosis. Aorta: The aortic root is normal in size and structure. Venous: The inferior vena cava is normal in size with greater than 50% respiratory variability, suggesting right atrial pressure of 3 mmHg. IAS/Shunts: No atrial level shunt detected by color flow Doppler.  LEFT VENTRICLE PLAX 2D LVIDd:         5.80 cm  Diastology LVIDs:         4.10 cm  LV e' medial:    3.81 cm/s LV PW:         0.70 cm  LV E/e' medial:  24.3 LV IVS:        1.00 cm  LV e' lateral:   4.57 cm/s LVOT diam:     2.00 cm  LV E/e' lateral: 20.2 LV SV:         78 LV SV Index:   40 LVOT Area:     3.14 cm  RIGHT VENTRICLE RV S  prime:     13.10 cm/s TAPSE (M-mode): 2.0 cm LEFT ATRIUM             Index       RIGHT ATRIUM           Index LA diam:        3.40 cm 1.74 cm/m  RA Area:     18.10 cm LA Vol (A2C):   56.0 ml 28.62 ml/m RA Volume:   47.40 ml  24.23 ml/m LA Vol (A4C):   51.1 ml 26.12 ml/m LA Biplane Vol: 53.4 ml 27.29 ml/m  AORTIC VALVE LVOT Vmax:   104.00 cm/s LVOT Vmean:  86.300 cm/s LVOT VTI:    0.249 m  AORTA Ao Root diam: 3.30 cm Ao Asc diam:  3.60 cm MITRAL VALVE                TRICUSPID VALVE MV Area (PHT): 2.48 cm     TR Peak grad:   30.2 mmHg MV Decel Time: 306 msec     TR Vmax:        275.00 cm/s MV E velocity: 92.40 cm/s MV A velocity: 138.00 cm/s  SHUNTS MV E/A ratio:  0.67         Systemic VTI:  0.25 m                             Systemic Diam: 2.00 cm Jenkins Rouge MD Electronically signed by Jenkins Rouge MD Signature Date/Time: 04/04/2020/11:23:04 AM    Final    VAS [place] LOWER EXTREMITY VENOUS (DVT)  Result Date: 04/13/2020  Lower Venous DVT Study Indications: Covid +, elevated D dimer.  Limitations: Body habitus, cooperation  and poor ultrasound/tissue interface. Performing Technologist: Antonieta Pert RDMS, RVT  Examination Guidelines: A complete evaluation includes B-mode imaging, spectral Doppler, color Doppler, and power Doppler as needed of all accessible portions of each vessel. Bilateral testing is considered an integral part of a complete examination. Limited examinations for reoccurring indications may be performed as noted. The reflux portion of the exam is performed with the patient in reverse Trendelenburg.  +---------+---------------+---------+-----------+----------+--------------+ RIGHT    CompressibilityPhasicitySpontaneityPropertiesThrombus Aging +---------+---------------+---------+-----------+----------+--------------+ CFV      Full           Yes      Yes                                 +---------+---------------+---------+-----------+----------+--------------+ SFJ      Full                                                        +---------+---------------+---------+-----------+----------+--------------+ FV Prox  Full                                                        +---------+---------------+---------+-----------+----------+--------------+ FV Mid   Full                                                        +---------+---------------+---------+-----------+----------+--------------+  FV DistalFull                                                        +---------+---------------+---------+-----------+----------+--------------+ PFV      Full                                                        +---------+---------------+---------+-----------+----------+--------------+ POP      Full           Yes      Yes                                 +---------+---------------+---------+-----------+----------+--------------+ PTV      Full                                                        +---------+---------------+---------+-----------+----------+--------------+  PERO     Partial                            dilated   Acute          +---------+---------------+---------+-----------+----------+--------------+ GSV      Full                                                        +---------+---------------+---------+-----------+----------+--------------+   +---------+---------------+---------+-----------+----------+--------------+ LEFT     CompressibilityPhasicitySpontaneityPropertiesThrombus Aging +---------+---------------+---------+-----------+----------+--------------+ CFV      Full           Yes      Yes                                 +---------+---------------+---------+-----------+----------+--------------+ SFJ      Full                                                        +---------+---------------+---------+-----------+----------+--------------+ FV Prox  Full                                                        +---------+---------------+---------+-----------+----------+--------------+ FV Mid   Full                                                        +---------+---------------+---------+-----------+----------+--------------+  FV DistalFull                                                        +---------+---------------+---------+-----------+----------+--------------+ PFV      Full                                                        +---------+---------------+---------+-----------+----------+--------------+ POP      Full           Yes      Yes                                 +---------+---------------+---------+-----------+----------+--------------+ PTV      Full                                                        +---------+---------------+---------+-----------+----------+--------------+ PERO     Full                                                        +---------+---------------+---------+-----------+----------+--------------+ GSV      Full                                                         +---------+---------------+---------+-----------+----------+--------------+     Summary: RIGHT: - Findings consistent with acute deep vein thrombosis involving the right peroneal veins. - No cystic structure found in the popliteal fossa. - Small segment of thrombus in the right calf.  LEFT: - There is no evidence of deep vein thrombosis in the lower extremity. However, portions of this examination were limited- see technologist comments above.  - No cystic structure found in the popliteal fossa. - Difficult exam bilaterally due to large body habitus, poor penetration and poor patient cooperation.  *See table(s) above for measurements and observations. Electronically signed by Harold Barban MD on 04/13/2020 at 6:07:50 PM.    Final     Assessment/Plan  1. 2019 novel coronavirus disease (COVID-19) -  She is unvaccinated and was treated with steroids and 1 dose of Actemra. She was given Rocephin and Remdesevir. - albuterol (VENTOLIN HFA) 108 (90 Base) MCG/ACT inhaler; Inhale 2 puffs into the lungs every 4 (four) hours as needed for wheezing or shortness of breath.  Dispense: 6.7 g; Refill: 0  2. ESRD (end stage renal disease) ([place]) -  On hemodialysis MWF  3. Acute deep vein thrombosis (DVT) of proximal vein of right lower extremity (HCC) - apixaban (ELIQUIS) 5 MG TABS tablet; Take 1 tablet (5 mg total) by mouth 2 (two) times daily.  Dispense: 60 tablet; Refill: 0  4. Diabetes  mellitus with ESRD (end-stage renal disease) ([place]) Lab Results  Component Value Date   HGBA1C 6.6 (H) 04/04/2020   - insulin lispro (HUMALOG) 100 UNIT/ML injection; Inject 0.06 mLs (6 Units total) into the skin 2 (two) times daily before a meal. Breakfast and evening meal  Dispense: 10 mL; Refill: 0 - TOUJEO SOLOSTAR 300 UNIT/ML Solostar Pen; Inject 6 Units into the skin at bedtime.  Dispense: 1.5 mL; Refill: 0  5. Renovascular hypertension - amLODipine (NORVASC) 5 MG tablet; Take 1 tablet (5 mg  total) by mouth daily.  Dispense: 30 tablet; Refill: 0 - hydrALAZINE (APRESOLINE) 50 MG tablet; Take 1 tablet (50 mg total) by mouth 3 (three) times daily.  Dispense: 90 tablet; Refill: 0 - metoprolol tartrate (LOPRESSOR) 50 MG tablet; Take 0.5 tablets (25 mg total) by mouth 2 (two) times daily.  Dispense: 30 tablet; Refill: 0  6. Chronic pain syndrome - DULoxetine (CYMBALTA) 60 MG capsule; Take 1 capsule (60 mg total) by mouth daily.  Dispense: 30 capsule; Refill: 0 - gabapentin (NEURONTIN) 100 MG capsule; Take 1 capsule (100 mg total) by mouth at bedtime.  Dispense: 30 capsule; Refill: 0  7. Mixed hyperlipidemia - pravastatin (PRAVACHOL) 40 MG tablet; Take 1 tablet (40 mg total) by mouth every evening.  Dispense: 30 tablet; Refill: 0  8. Gastroesophageal reflux disease without esophagitis - pantoprazole (PROTONIX) 20 MG tablet; Take 1 tablet (20 mg total) by mouth daily.  Dispense: 30 tablet; Refill: 0     I have filled out patient's discharge paperwork and written prescriptions.  Patient will receive home health PT and OT.  DME provided:  None  Total discharge time: Greater than 30 minutes Greater than 50% was spent in counseling and coordination of care.   Discharge time involved coordination of the discharge process with social worker, nursing staff and therapy department. Medical justification for home health services verified.    Durenda Age, DNP, MSN, FNP-BC [facility] [phone] (Monday-Friday 8:00 a.m. - 5:00 p.m.) [phone] (after hours)

## 2020-05-05 ENCOUNTER — Telehealth: Payer: Self-pay

## 2020-05-05 DIAGNOSIS — E46 Unspecified protein-calorie malnutrition: Secondary | ICD-10-CM | POA: Insufficient documentation

## 2020-05-05 NOTE — Telephone Encounter (Signed)
Pharmacy would like to clarify the dosage on the Toujeo. The old prescription says 70 units and the new one sent in on yesterday says 6 units should it be 60 units? Please advise.

## 2020-06-10 ENCOUNTER — Other Ambulatory Visit: Payer: Self-pay | Admitting: Student

## 2020-06-10 DIAGNOSIS — E2839 Other primary ovarian failure: Secondary | ICD-10-CM

## 2020-06-22 ENCOUNTER — Other Ambulatory Visit: Payer: Self-pay | Admitting: Adult Health

## 2020-06-22 DIAGNOSIS — K219 Gastro-esophageal reflux disease without esophagitis: Secondary | ICD-10-CM

## 2020-06-22 DIAGNOSIS — Z992 Dependence on renal dialysis: Secondary | ICD-10-CM | POA: Insufficient documentation

## 2020-07-09 ENCOUNTER — Other Ambulatory Visit: Payer: Self-pay | Admitting: *Deleted

## 2020-07-09 DIAGNOSIS — N184 Chronic kidney disease, stage 4 (severe): Secondary | ICD-10-CM

## 2020-07-13 ENCOUNTER — Ambulatory Visit (HOSPITAL_COMMUNITY): Payer: Medicare Other

## 2020-07-13 ENCOUNTER — Encounter: Payer: Medicare Other | Admitting: Vascular Surgery

## 2020-08-10 ENCOUNTER — Other Ambulatory Visit (HOSPITAL_COMMUNITY)
Admission: RE | Admit: 2020-08-10 | Discharge: 2020-08-10 | Disposition: A | Discharge status: Home / Self Care | Payer: Medicare Other | Source: Ambulatory Visit | Attending: Vascular Surgery | Admitting: Vascular Surgery

## 2020-08-10 ENCOUNTER — Other Ambulatory Visit: Payer: Self-pay | Admitting: *Deleted

## 2020-08-10 ENCOUNTER — Encounter: Payer: Self-pay | Admitting: *Deleted

## 2020-08-10 ENCOUNTER — Ambulatory Visit (INDEPENDENT_AMBULATORY_CARE_PROVIDER_SITE_OTHER)
Admission: RE | Admit: 2020-08-10 | Discharge: 2020-08-10 | Disposition: A | Discharge status: Home / Self Care | Payer: Medicare Other | Source: Ambulatory Visit | Attending: Vascular Surgery | Admitting: Vascular Surgery

## 2020-08-10 ENCOUNTER — Ambulatory Visit (HOSPITAL_COMMUNITY)
Admission: RE | Admit: 2020-08-10 | Discharge: 2020-08-10 | Disposition: A | Discharge status: Home / Self Care | Payer: Medicare Other | Source: Ambulatory Visit | Attending: Vascular Surgery | Admitting: Vascular Surgery

## 2020-08-10 ENCOUNTER — Other Ambulatory Visit: Payer: Self-pay

## 2020-08-10 ENCOUNTER — Ambulatory Visit (INDEPENDENT_AMBULATORY_CARE_PROVIDER_SITE_OTHER): Payer: Medicare Other | Admitting: Vascular Surgery

## 2020-08-10 ENCOUNTER — Encounter: Payer: Self-pay | Admitting: Vascular Surgery

## 2020-08-10 VITALS — BP 178/68 | HR 58 | Temp 97.4°F | Resp 16 | Ht 62.0 in | Wt 195.0 lb

## 2020-08-10 DIAGNOSIS — Z992 Dependence on renal dialysis: Secondary | ICD-10-CM

## 2020-08-10 DIAGNOSIS — N184 Chronic kidney disease, stage 4 (severe): Secondary | ICD-10-CM

## 2020-08-10 DIAGNOSIS — N186 End stage renal disease: Secondary | ICD-10-CM | POA: Diagnosis not present

## 2020-08-10 DIAGNOSIS — Z20822 Contact with and (suspected) exposure to covid-19: Secondary | ICD-10-CM | POA: Diagnosis not present

## 2020-08-10 DIAGNOSIS — Z01818 Encounter for other preprocedural examination: Secondary | ICD-10-CM | POA: Insufficient documentation

## 2020-08-10 LAB — SARS CORONAVIRUS 2 (TAT 6-24 HRS): SARS Coronavirus 2: NEGATIVE

## 2020-08-10 NOTE — Progress Notes (Signed)
[facility] [place]  ASSESSMENT / PLAN: Ann [name] is a 70 y.o. right handed female in need of permanent hemodialysis access. I reviewed options for dialysis in detail with the patient. I counseled the patient that dialysis access requires surveillance and periodic maintenance. Plan to proceed with right brachiocephalic arteriovenous fistula creation.   CHIEF COMPLAINT: in need of HD acces  HISTORY OF PRESENT ILLNESS: Ann [name] is a 70 y.o. female with ESRD on HD MWF dialyzing via LIJ TDC. She is right handed. She has no history of prior central venous catheter or pacemaker. No history of trauma or surgery to the arm.   Past Medical History:  Diagnosis Date  . Anemia 09/29/2008   anemia of chronic disease  . Anginal pain ([place])    no recent chest pain  . Arthritis   . Asthma 09/29/2008  . CAD (coronary artery disease) 09/29/2008   hx  PCI - [facility]. Stress test 10/2011 nl with EF 58%. [facility] card., last cath 2010  . Chest pain 02/16/2009  . CKD (chronic kidney disease), stage IV ([place]) 10/20/2009  . GERD (gastroesophageal reflux disease)   . Hyperlipidemia 10/21/2009  . Hypertension 09/29/2008  . Insulin dependent diabetes mellitus type IA ([place]) 09/29/2008  . Lumbar back pain 02/16/2009   states pain goes down right leg  . Myalgia 02/16/2009  . Shortness of breath    with exerction  . Shoulder pain, left 03/16/2009   occurs at night  . Sleep apnea    CPAP    Past Surgical History:  Procedure Laterality Date  . ABDOMINAL HYSTERECTOMY    . APPENDECTOMY    . CARDIAC CATHETERIZATION  2008   in [place], [place] stable  . CARDIAC CATHETERIZATION  2010   patent stents in mid and distal RCA; high grade disease in prox & mid AV groove LCX& OM; mod disease in prox & mid LAD normal LV FUNCTION--TREATED MEDICALLY    . CATARACT EXTRACTION, BILATERAL    . CHOLECYSTECTOMY    . CORONARY ANGIOPLASTY WITH STENT PLACEMENT  2005   2 vessel PCI at [facility].  [place]   EF 60%  . IR FLUORO GUIDE CV LINE LEFT  04/21/2020  . IR US GUIDE VASC ACCESS LEFT  04/21/2020  . TOTAL KNEE ARTHROPLASTY  12/2011   Left  . TOTAL KNEE ARTHROPLASTY  11/28/2011   Procedure: TOTAL KNEE ARTHROPLASTY;  Surgeon: Hessie Dibble, MD;  Location: [place];  Service: Orthopedics;  Laterality: Right;    Family History  Problem Relation Age of Onset  . Heart attack Father   . Heart disease Sister   . Heart disease Brother   . Diabetes Mother   . Hypertension Mother   . Heart disease Mother   . Heart attack Mother   . Stroke Mother   . Colon cancer Neg Hx     Social History   Socioeconomic History  . Marital status: Legally Separated    Spouse name: Not on file  . Number of children: 4  . Years of education: 35  . Highest education level: Not on file  Occupational History    Employer: UNEMPLOYED    Comment: disability  Tobacco Use  . Smoking status: Never Smoker  . Smokeless tobacco: Current User    Types: Snuff  . Tobacco comment: using "all my life"  Vaping Use  . Vaping Use: Never used  Substance and Sexual Activity  . Alcohol  use: No    Alcohol/week: 0.0 standard drinks  . Drug use: No  . Sexual activity: Not Currently  Other Topics Concern  . Not on file  Social History Narrative  . Not on file   Social Determinants of Health   Financial Resource Strain: Not on file  Food Insecurity: Not on file  Transportation Needs: Not on file  Physical Activity: Not on file  Stress: Not on file  Social Connections: Not on file  Intimate Partner Violence: Not on file    Allergies  Allergen Reactions  . Codeine Nausea And Vomiting  . Metronidazole Itching    Current Outpatient Medications  Medication Sig Dispense Refill  . ACCU-CHEK FASTCLIX LANCETS MISC USE AS DIRECTED TO CHECK BLOOD SUGAR FOUR TIMES A DAY(BEFORE MEALS AND AT BEDTIME). 102 each 9  . ACCU-CHEK SMARTVIEW test strip CHECK BLOOD SUGAR BEFORE MEALS  AND AT BEDTIME 150 each 9  . acetaminophen (TYLENOL) 325 MG tablet Take 650 mg by mouth every 4 (four) hours as needed.    Marland Kitchen albuterol (VENTOLIN HFA) 108 (90 Base) MCG/ACT inhaler Inhale 2 puffs into the lungs every 4 (four) hours as needed for wheezing or shortness of breath. 6.7 g 0  . Amino Acids-Protein Hydrolys (FEEDING SUPPLEMENT, PRO-STAT SUGAR FREE 64,) LIQD Take 30 mLs by mouth 2 (two) times daily.    Marland Kitchen amLODipine (NORVASC) 5 MG tablet Take 1 tablet (5 mg total) by mouth daily. 30 tablet 0  . apixaban (ELIQUIS) 5 MG TABS tablet Take 1 tablet (5 mg total) by mouth 2 (two) times daily. 60 tablet 0  . DULoxetine (CYMBALTA) 60 MG capsule Take 1 capsule (60 mg total) by mouth daily. 30 capsule 0  . gabapentin (NEURONTIN) 100 MG capsule Take 1 capsule (100 mg total) by mouth at bedtime. 30 capsule 0  . hydrALAZINE (APRESOLINE) 50 MG tablet Take 1 tablet (50 mg total) by mouth 3 (three) times daily. 90 tablet 0  . insulin lispro (HUMALOG) 100 UNIT/ML injection Inject 0.06 mLs (6 Units total) into the skin 2 (two) times daily before a meal. Breakfast and evening meal 10 mL 0  . Insulin Pen Needle (EASY TOUCH PEN NEEDLES) 31G X 8 MM MISC Use to inject insulin as instructed. 100 each 11  . metoprolol tartrate (LOPRESSOR) 50 MG tablet Take 0.5 tablets (25 mg total) by mouth 2 (two) times daily. 30 tablet 0  . pantoprazole (PROTONIX) 20 MG tablet Take 1 tablet (20 mg total) by mouth daily. 30 tablet 0  . pravastatin (PRAVACHOL) 40 MG tablet Take 1 tablet (40 mg total) by mouth every evening. 30 tablet 0  . protective barrier (RESTORE) CREA Apply 1 application topically 2 (two) times daily. Apply to inner buttock    . rOPINIRole (REQUIP) 0.25 MG tablet Take 1 tablet (0.25 mg total) by mouth every evening. 30 tablet 0  . TOUJEO SOLOSTAR 300 UNIT/ML Solostar Pen Inject 6 Units into the skin at bedtime. 1.5 mL 0   No current facility-administered medications for this visit.    REVIEW OF SYSTEMS:  [X]   denotes positive finding, [ ]  denotes negative finding Cardiac  Comments:  Chest pain or chest pressure:    Shortness of breath upon exertion:    Short of breath when lying flat:    Irregular heart rhythm:        Vascular    Pain in calf, thigh, or hip brought on by ambulation:    Pain in feet at night that wakes you  up from your sleep:     Blood clot in your veins:    Leg swelling:         Pulmonary    Oxygen at home:    Productive cough:     Wheezing:         Neurologic    Sudden weakness in arms or legs:     Sudden numbness in arms or legs:     Sudden onset of difficulty speaking or slurred speech:    Temporary loss of vision in one eye:     Problems with dizziness:         Gastrointestinal    Blood in stool:     Vomited blood:         Genitourinary    Burning when urinating:     Blood in urine:        Psychiatric    Major depression:         Hematologic    Bleeding problems:    Problems with blood clotting too easily:        Skin    Rashes or ulcers:        Constitutional    Fever or chills:      PHYSICAL EXAM  Vitals:   08/10/20 1307  BP: (!) 178/68  Pulse: (!) 58  Resp: 16  Temp: (!) 97.4 F (36.3 C)  TempSrc: Temporal  SpO2: 98%  Weight: 195 lb (88.5 kg)  Height: 5\' 2"  (1.575 m)    Constitutional: well appearing. no distress. Appears well nourished.  Neurologic: CN intact. no focal findings. no sensory loss. Psychiatric: Mood and affect symmetric and appropriate. Eyes: No icterus. No conjunctival pallor. Ears, nose, throat: mucous membranes moist. Midline trachea.  Cardiac: regular rate and rhythm.  Respiratory: unlabored. Abdominal: soft, non-tender, non-distended.  Peripheral vascular:  2+ radial pulses bilaterally  2+ brachial pulses bilaterally  LIJ TDC Extremity: No edema. No cyanosis. No pallor.  Skin: No gangrene. No ulceration.  Lymphatic: No Stemmer's sign. No palpable lymphadenopathy.  PERTINENT LABORATORY AND RADIOLOGIC  DATA  Most recent CBC CBC Latest Ref Rng & Units 04/21/2020 04/20/2020 04/19/2020  WBC 4.0 - 10.5 K/uL 3.6(L) 4.0 4.0  Hemoglobin 12.0 - 15.0 g/dL 8.6(L) 8.8(L) 8.7(L)  Hematocrit 36.0 - 46.0 % 26.2(L) 28.0(L) 27.7(L)  Platelets 150 - 400 K/uL 92(L) 105(L) 115(L)     Most recent CMP CMP Latest Ref Rng & Units 04/21/2020 04/20/2020 04/19/2020  Glucose 70 - 99 mg/dL 155(H) 104(H) 194(H)  BUN 8 - 23 mg/dL 17 38(H) 35(H)  Creatinine 0.44 - 1.00 mg/dL 3.14(H) 4.67(H) 4.42(H)  Sodium 135 - 145 mmol/L 138 139 139  Potassium 3.5 - 5.1 mmol/L 3.3(L) 3.2(L) 3.2(L)  Chloride 98 - 111 mmol/L 103 104 102  CO2 22 - 32 mmol/L 26 26 26   Calcium 8.9 - 10.3 mg/dL 8.0(L) 8.2(L) 8.1(L)  Total Protein 6.5 - 8.1 g/dL - - -  Total Bilirubin 0.3 - 1.2 mg/dL - - -  Alkaline Phos 38 - 126 U/L - - -  AST 15 - 41 U/L - - -  ALT 0 - 44 U/L - - -    Renal function CrCl cannot be calculated (Patient's most recent lab result is older than the maximum 21 days allowed.).  Hgb A1c MFr Bld (%)  Date Value  04/04/2020 6.6 (H)    LDL Cholesterol  Date Value Ref Range Status  01/17/2012 86 0 - 99 mg/dL Final    Comment:  Total Cholesterol/HDL Ratio:CHD Risk                        Coronary Heart Disease Risk Table                                        Men       Women          1/2 Average Risk              3.4        3.3              Average Risk              5.0        4.4           2X Average Risk              9.6        7.1           3X Average Risk             23.4       11.0 Use the calculated Patient Ratio above and the CHD Risk table  to determine the patient's CHD Risk. ATP III Classification (LDL):       < 100        mg/dL         Optimal      100 - 129     mg/dL         Near or Above Optimal      130 - 159     mg/dL         Borderline High      160 - 189     mg/dL         High       > 190        mg/dL         Very High       Vascular Imaging:   Yevonne Aline. Stanford Breed, MD [facility] Phone Number: [phone] 08/10/2020 1:28 PM

## 2020-08-10 NOTE — H&P (View-Only) (Signed)
[facility] [place]  ASSESSMENT / PLAN: Ann [name] is a 70 y.o. right handed female in need of permanent hemodialysis access. I reviewed options for dialysis in detail with the patient. I counseled the patient that dialysis access requires surveillance and periodic maintenance. Plan to proceed with right brachiocephalic arteriovenous fistula creation.   CHIEF COMPLAINT: in need of HD acces  HISTORY OF PRESENT ILLNESS: Ann [name] is a 70 y.o. female with ESRD on HD MWF dialyzing via LIJ TDC. She is right handed. She has no history of prior central venous catheter or pacemaker. No history of trauma or surgery to the arm.   Past Medical History:  Diagnosis Date  . Anemia 09/29/2008   anemia of chronic disease  . Anginal pain ([place])    no recent chest pain  . Arthritis   . Asthma 09/29/2008  . CAD (coronary artery disease) 09/29/2008   hx  PCI - [facility]. Stress test 10/2011 nl with EF 58%. [facility] card., last cath 2010  . Chest pain 02/16/2009  . CKD (chronic kidney disease), stage IV ([place]) 10/20/2009  . GERD (gastroesophageal reflux disease)   . Hyperlipidemia 10/21/2009  . Hypertension 09/29/2008  . Insulin dependent diabetes mellitus type IA ([place]) 09/29/2008  . Lumbar back pain 02/16/2009   states pain goes down right leg  . Myalgia 02/16/2009  . Shortness of breath    with exerction  . Shoulder pain, left 03/16/2009   occurs at night  . Sleep apnea    CPAP    Past Surgical History:  Procedure Laterality Date  . ABDOMINAL HYSTERECTOMY    . APPENDECTOMY    . CARDIAC CATHETERIZATION  2008   in [place], [place] stable  . CARDIAC CATHETERIZATION  2010   patent stents in mid and distal RCA; high grade disease in prox & mid AV groove LCX& OM; mod disease in prox & mid LAD normal LV FUNCTION--TREATED MEDICALLY    . CATARACT EXTRACTION, BILATERAL    . CHOLECYSTECTOMY    . CORONARY ANGIOPLASTY WITH STENT PLACEMENT  2005   2 vessel PCI at [facility]  [place]   EF 60%  . IR FLUORO GUIDE CV LINE LEFT  04/21/2020  . IR US GUIDE VASC ACCESS LEFT  04/21/2020  . TOTAL KNEE ARTHROPLASTY  12/2011   Left  . TOTAL KNEE ARTHROPLASTY  11/28/2011   Procedure: TOTAL KNEE ARTHROPLASTY;  Surgeon: Hessie Dibble, MD;  Location: [facility];  Service: Orthopedics;  Laterality: Right;    Family History  Problem Relation Age of Onset  . Heart attack Father   . Heart disease Sister   . Heart disease Brother   . Diabetes Mother   . Hypertension Mother   . Heart disease Mother   . Heart attack Mother   . Stroke Mother   . Colon cancer Neg Hx     Social History   Socioeconomic History  . Marital status: Legally Separated    Spouse name: Not on file  . Number of children: 4  . Years of education: 14  . Highest education level: Not on file  Occupational History    Employer: UNEMPLOYED    Comment: disability  Tobacco Use  . Smoking status: Never Smoker  . Smokeless tobacco: Current User    Types: Snuff  . Tobacco comment: using "all my life"  Vaping Use  . Vaping Use: Never used  Substance and Sexual Activity  . Alcohol  use: No    Alcohol/week: 0.0 standard drinks  . Drug use: No  . Sexual activity: Not Currently  Other Topics Concern  . Not on file  Social History Narrative  . Not on file   Social Determinants of Health   Financial Resource Strain: Not on file  Food Insecurity: Not on file  Transportation Needs: Not on file  Physical Activity: Not on file  Stress: Not on file  Social Connections: Not on file  Intimate Partner Violence: Not on file    Allergies  Allergen Reactions  . Codeine Nausea And Vomiting  . Metronidazole Itching    Current Outpatient Medications  Medication Sig Dispense Refill  . ACCU-CHEK FASTCLIX LANCETS MISC USE AS DIRECTED TO CHECK BLOOD SUGAR FOUR TIMES A DAY(BEFORE MEALS AND AT BEDTIME). 102 each 9  . ACCU-CHEK SMARTVIEW test strip CHECK BLOOD SUGAR BEFORE MEALS  AND AT BEDTIME 150 each 9  . acetaminophen (TYLENOL) 325 MG tablet Take 650 mg by mouth every 4 (four) hours as needed.    Marland Kitchen albuterol (VENTOLIN HFA) 108 (90 Base) MCG/ACT inhaler Inhale 2 puffs into the lungs every 4 (four) hours as needed for wheezing or shortness of breath. 6.7 g 0  . Amino Acids-Protein Hydrolys (FEEDING SUPPLEMENT, PRO-STAT SUGAR FREE 64,) LIQD Take 30 mLs by mouth 2 (two) times daily.    Marland Kitchen amLODipine (NORVASC) 5 MG tablet Take 1 tablet (5 mg total) by mouth daily. 30 tablet 0  . apixaban (ELIQUIS) 5 MG TABS tablet Take 1 tablet (5 mg total) by mouth 2 (two) times daily. 60 tablet 0  . DULoxetine (CYMBALTA) 60 MG capsule Take 1 capsule (60 mg total) by mouth daily. 30 capsule 0  . gabapentin (NEURONTIN) 100 MG capsule Take 1 capsule (100 mg total) by mouth at bedtime. 30 capsule 0  . hydrALAZINE (APRESOLINE) 50 MG tablet Take 1 tablet (50 mg total) by mouth 3 (three) times daily. 90 tablet 0  . insulin lispro (HUMALOG) 100 UNIT/ML injection Inject 0.06 mLs (6 Units total) into the skin 2 (two) times daily before a meal. Breakfast and evening meal 10 mL 0  . Insulin Pen Needle (EASY TOUCH PEN NEEDLES) 31G X 8 MM MISC Use to inject insulin as instructed. 100 each 11  . metoprolol tartrate (LOPRESSOR) 50 MG tablet Take 0.5 tablets (25 mg total) by mouth 2 (two) times daily. 30 tablet 0  . pantoprazole (PROTONIX) 20 MG tablet Take 1 tablet (20 mg total) by mouth daily. 30 tablet 0  . pravastatin (PRAVACHOL) 40 MG tablet Take 1 tablet (40 mg total) by mouth every evening. 30 tablet 0  . protective barrier (RESTORE) CREA Apply 1 application topically 2 (two) times daily. Apply to inner buttock    . rOPINIRole (REQUIP) 0.25 MG tablet Take 1 tablet (0.25 mg total) by mouth every evening. 30 tablet 0  . TOUJEO SOLOSTAR 300 UNIT/ML Solostar Pen Inject 6 Units into the skin at bedtime. 1.5 mL 0   No current facility-administered medications for this visit.    REVIEW OF SYSTEMS:  [X]   denotes positive finding, [ ]  denotes negative finding Cardiac  Comments:  Chest pain or chest pressure:    Shortness of breath upon exertion:    Short of breath when lying flat:    Irregular heart rhythm:        Vascular    Pain in calf, thigh, or hip brought on by ambulation:    Pain in feet at night that wakes you  up from your sleep:     Blood clot in your veins:    Leg swelling:         Pulmonary    Oxygen at home:    Productive cough:     Wheezing:         Neurologic    Sudden weakness in arms or legs:     Sudden numbness in arms or legs:     Sudden onset of difficulty speaking or slurred speech:    Temporary loss of vision in one eye:     Problems with dizziness:         Gastrointestinal    Blood in stool:     Vomited blood:         Genitourinary    Burning when urinating:     Blood in urine:        Psychiatric    Major depression:         Hematologic    Bleeding problems:    Problems with blood clotting too easily:        Skin    Rashes or ulcers:        Constitutional    Fever or chills:      PHYSICAL EXAM  Vitals:   08/10/20 1307  BP: (!) 178/68  Pulse: (!) 58  Resp: 16  Temp: (!) 97.4 F (36.3 C)  TempSrc: Temporal  SpO2: 98%  Weight: 195 lb (88.5 kg)  Height: 5\' 2"  (1.575 m)    Constitutional: well appearing. no distress. Appears well nourished.  Neurologic: CN intact. no focal findings. no sensory loss. Psychiatric: Mood and affect symmetric and appropriate. Eyes: No icterus. No conjunctival pallor. Ears, nose, throat: mucous membranes moist. Midline trachea.  Cardiac: regular rate and rhythm.  Respiratory: unlabored. Abdominal: soft, non-tender, non-distended.  Peripheral vascular:  2+ radial pulses bilaterally  2+ brachial pulses bilaterally  LIJ TDC Extremity: No edema. No cyanosis. No pallor.  Skin: No gangrene. No ulceration.  Lymphatic: No Stemmer's sign. No palpable lymphadenopathy.  PERTINENT LABORATORY AND RADIOLOGIC  DATA  Most recent CBC CBC Latest Ref Rng & Units 04/21/2020 04/20/2020 04/19/2020  WBC 4.0 - 10.5 K/uL 3.6(L) 4.0 4.0  Hemoglobin 12.0 - 15.0 g/dL 8.6(L) 8.8(L) 8.7(L)  Hematocrit 36.0 - 46.0 % 26.2(L) 28.0(L) 27.7(L)  Platelets 150 - 400 K/uL 92(L) 105(L) 115(L)     Most recent CMP CMP Latest Ref Rng & Units 04/21/2020 04/20/2020 04/19/2020  Glucose 70 - 99 mg/dL 155(H) 104(H) 194(H)  BUN 8 - 23 mg/dL 17 38(H) 35(H)  Creatinine 0.44 - 1.00 mg/dL 3.14(H) 4.67(H) 4.42(H)  Sodium 135 - 145 mmol/L 138 139 139  Potassium 3.5 - 5.1 mmol/L 3.3(L) 3.2(L) 3.2(L)  Chloride 98 - 111 mmol/L 103 104 102  CO2 22 - 32 mmol/L 26 26 26   Calcium 8.9 - 10.3 mg/dL 8.0(L) 8.2(L) 8.1(L)  Total Protein 6.5 - 8.1 g/dL - - -  Total Bilirubin 0.3 - 1.2 mg/dL - - -  Alkaline Phos 38 - 126 U/L - - -  AST 15 - 41 U/L - - -  ALT 0 - 44 U/L - - -    Renal function CrCl cannot be calculated (Patient's most recent lab result is older than the maximum 21 days allowed.).  Hgb A1c MFr Bld (%)  Date Value  04/04/2020 6.6 (H)    LDL Cholesterol  Date Value Ref Range Status  01/17/2012 86 0 - 99 mg/dL Final    Comment:  Total Cholesterol/HDL Ratio:CHD Risk                        Coronary Heart Disease Risk Table                                        Men       Women          1/2 Average Risk              3.4        3.3              Average Risk              5.0        4.4           2X Average Risk              9.6        7.1           3X Average Risk             23.4       11.0 Use the calculated Patient Ratio above and the CHD Risk table  to determine the patient's CHD Risk. ATP III Classification (LDL):       < 100        mg/dL         Optimal      100 - 129     mg/dL         Near or Above Optimal      130 - 159     mg/dL         Borderline High      160 - 189     mg/dL         High       > 190        mg/dL         Very High       Vascular Imaging:   Yevonne Aline. Stanford Breed, MD [facility] Phone Number: [phone] 08/10/2020 1:28 PM

## 2020-08-11 NOTE — Progress Notes (Signed)
Unable to contact patient.  Left arrival time and instructions on VM.  EKG DOS

## 2020-08-12 ENCOUNTER — Ambulatory Visit (HOSPITAL_COMMUNITY)
Admission: RE | Admit: 2020-08-12 | Discharge: 2020-08-12 | Disposition: A | Discharge status: Home / Self Care | Payer: Medicare Other | Attending: Surgery | Admitting: Surgery

## 2020-08-12 ENCOUNTER — Ambulatory Visit (HOSPITAL_COMMUNITY): Payer: Medicare Other | Admitting: Certified Registered Nurse Anesthetist

## 2020-08-12 ENCOUNTER — Encounter (HOSPITAL_COMMUNITY): Payer: Self-pay | Admitting: Surgery

## 2020-08-12 ENCOUNTER — Other Ambulatory Visit: Payer: Self-pay

## 2020-08-12 ENCOUNTER — Encounter (HOSPITAL_COMMUNITY)
Admission: RE | Disposition: A | Discharge status: Home / Self Care | Payer: Self-pay | Source: Home / Self Care | Attending: Surgery

## 2020-08-12 DIAGNOSIS — Z833 Family history of diabetes mellitus: Secondary | ICD-10-CM | POA: Insufficient documentation

## 2020-08-12 DIAGNOSIS — Z8249 Family history of ischemic heart disease and other diseases of the circulatory system: Secondary | ICD-10-CM | POA: Insufficient documentation

## 2020-08-12 DIAGNOSIS — Z992 Dependence on renal dialysis: Secondary | ICD-10-CM | POA: Diagnosis not present

## 2020-08-12 DIAGNOSIS — Z823 Family history of stroke: Secondary | ICD-10-CM | POA: Insufficient documentation

## 2020-08-12 DIAGNOSIS — Z7951 Long term (current) use of inhaled steroids: Secondary | ICD-10-CM | POA: Insufficient documentation

## 2020-08-12 DIAGNOSIS — E1022 Type 1 diabetes mellitus with diabetic chronic kidney disease: Secondary | ICD-10-CM | POA: Diagnosis not present

## 2020-08-12 DIAGNOSIS — Z794 Long term (current) use of insulin: Secondary | ICD-10-CM | POA: Diagnosis not present

## 2020-08-12 DIAGNOSIS — Z79899 Other long term (current) drug therapy: Secondary | ICD-10-CM | POA: Diagnosis not present

## 2020-08-12 DIAGNOSIS — I120 Hypertensive chronic kidney disease with stage 5 chronic kidney disease or end stage renal disease: Secondary | ICD-10-CM | POA: Insufficient documentation

## 2020-08-12 DIAGNOSIS — Z888 Allergy status to other drugs, medicaments and biological substances status: Secondary | ICD-10-CM | POA: Diagnosis not present

## 2020-08-12 DIAGNOSIS — N185 Chronic kidney disease, stage 5: Secondary | ICD-10-CM | POA: Diagnosis not present

## 2020-08-12 DIAGNOSIS — N186 End stage renal disease: Secondary | ICD-10-CM | POA: Insufficient documentation

## 2020-08-12 DIAGNOSIS — N184 Chronic kidney disease, stage 4 (severe): Secondary | ICD-10-CM

## 2020-08-12 DIAGNOSIS — Z885 Allergy status to narcotic agent status: Secondary | ICD-10-CM | POA: Insufficient documentation

## 2020-08-12 HISTORY — PX: AV FISTULA PLACEMENT: SHX1204

## 2020-08-12 LAB — POCT I-STAT, CHEM 8
BUN: 15 mg/dL (ref 8–23)
Calcium, Ion: 1.12 mmol/L — ABNORMAL LOW (ref 1.15–1.40)
Chloride: 100 mmol/L (ref 98–111)
Creatinine, Ser: 4.3 mg/dL — ABNORMAL HIGH (ref 0.44–1.00)
Glucose, Bld: 118 mg/dL — ABNORMAL HIGH (ref 70–99)
HCT: 40 % (ref 36.0–46.0)
Hemoglobin: 13.6 g/dL (ref 12.0–15.0)
Potassium: 3.4 mmol/L — ABNORMAL LOW (ref 3.5–5.1)
Sodium: 140 mmol/L (ref 135–145)
TCO2: 27 mmol/L (ref 22–32)

## 2020-08-12 LAB — GLUCOSE, CAPILLARY
Glucose-Capillary: 133 mg/dL — ABNORMAL HIGH (ref 70–99)
Glucose-Capillary: 131 mg/dL — ABNORMAL HIGH (ref 70–99)

## 2020-08-12 SURGERY — ARTERIOVENOUS (AV) FISTULA CREATION
Anesthesia: Monitor Anesthesia Care | Site: Arm Upper | Laterality: Right

## 2020-08-12 MED ORDER — LIDOCAINE-EPINEPHRINE (PF) 1 %-1:200000 IJ SOLN
INTRAMUSCULAR | Status: AC
  Filled 2020-08-12: qty 30

## 2020-08-12 MED ORDER — 0.9 % SODIUM CHLORIDE (POUR BTL) OPTIME
TOPICAL | Status: DC | PRN
  Administered 2020-08-12: 1000 mL

## 2020-08-12 MED ORDER — CHLORHEXIDINE GLUCONATE 0.12 % MT SOLN
OROMUCOSAL | Status: AC
  Administered 2020-08-12: 15 mL
  Filled 2020-08-12: qty 15

## 2020-08-12 MED ORDER — CHLORHEXIDINE GLUCONATE 4 % EX LIQD
60.0000 mL | Freq: Once | CUTANEOUS | Status: DC
Start: 2020-08-13 — End: 2020-08-12

## 2020-08-12 MED ORDER — LIDOCAINE-EPINEPHRINE (PF) 1 %-1:200000 IJ SOLN
INTRAMUSCULAR | Status: DC | PRN
  Administered 2020-08-12: 6 mL

## 2020-08-12 MED ORDER — FENTANYL CITRATE (PF) 250 MCG/5ML IJ SOLN
INTRAMUSCULAR | Status: AC
  Filled 2020-08-12: qty 5

## 2020-08-12 MED ORDER — HYDROCODONE-ACETAMINOPHEN 5-325 MG PO TABS: 1.0000 | ORAL_TABLET | ORAL | 0 refills | Status: DC | PRN

## 2020-08-12 MED ORDER — ACETAMINOPHEN 500 MG PO TABS
1000.0000 mg | ORAL_TABLET | Freq: Once | ORAL | Status: AC
  Administered 2020-08-12: 1000 mg via ORAL
  Filled 2020-08-12: qty 2

## 2020-08-12 MED ORDER — MIDAZOLAM HCL 2 MG/2ML IJ SOLN
INTRAMUSCULAR | Status: AC
  Filled 2020-08-12: qty 2

## 2020-08-12 MED ORDER — FENTANYL CITRATE (PF) 100 MCG/2ML IJ SOLN
INTRAMUSCULAR | Status: AC
  Filled 2020-08-12: qty 2

## 2020-08-12 MED ORDER — CHLORHEXIDINE GLUCONATE 4 % EX LIQD: 60.0000 mL | Freq: Once | CUTANEOUS | Status: DC

## 2020-08-12 MED ORDER — FENTANYL CITRATE (PF) 250 MCG/5ML IJ SOLN
INTRAMUSCULAR | Status: DC | PRN
  Administered 2020-08-12: 50 ug via INTRAVENOUS

## 2020-08-12 MED ORDER — PAPAVERINE HCL 30 MG/ML IJ SOLN
INTRAMUSCULAR | Status: AC
  Filled 2020-08-12: qty 2

## 2020-08-12 MED ORDER — METOPROLOL TARTRATE 25 MG PO TABS
25.0000 mg | ORAL_TABLET | Freq: Once | ORAL | Status: AC
  Administered 2020-08-12: 25 mg via ORAL
  Filled 2020-08-12: qty 1

## 2020-08-12 MED ORDER — SODIUM CHLORIDE 0.9 % IV SOLN: INTRAVENOUS | Status: DC

## 2020-08-12 MED ORDER — PROPOFOL 500 MG/50ML IV EMUL
INTRAVENOUS | Status: DC | PRN
  Administered 2020-08-12: 75 ug/kg/min via INTRAVENOUS

## 2020-08-12 MED ORDER — MIDAZOLAM HCL 5 MG/5ML IJ SOLN
INTRAMUSCULAR | Status: DC | PRN
  Administered 2020-08-12 (×2): 1 mg via INTRAVENOUS

## 2020-08-12 MED ORDER — CEFAZOLIN SODIUM-DEXTROSE 2-4 GM/100ML-% IV SOLN
2.0000 g | INTRAVENOUS | Status: AC
  Administered 2020-08-12: 2 g via INTRAVENOUS

## 2020-08-12 MED ORDER — SODIUM CHLORIDE 0.9 % IV SOLN
INTRAVENOUS | Status: AC
  Filled 2020-08-12: qty 1.2

## 2020-08-12 MED ORDER — FENTANYL CITRATE (PF) 100 MCG/2ML IJ SOLN: 25.0000 ug | INTRAMUSCULAR | Status: DC | PRN

## 2020-08-12 MED ORDER — SODIUM CHLORIDE 0.9 % IV SOLN
INTRAVENOUS | Status: DC | PRN
  Administered 2020-08-12: 500 mL

## 2020-08-12 MED ORDER — METOPROLOL TARTRATE 12.5 MG HALF TABLET
ORAL_TABLET | ORAL | Status: AC
  Filled 2020-08-12: qty 2

## 2020-08-12 MED ORDER — PHENYLEPHRINE HCL-NACL 10-0.9 MG/250ML-% IV SOLN
INTRAVENOUS | Status: DC | PRN
  Administered 2020-08-12: 25 ug/min via INTRAVENOUS

## 2020-08-12 MED ORDER — LIDOCAINE 2% (20 MG/ML) 5 ML SYRINGE
INTRAMUSCULAR | Status: DC | PRN
  Administered 2020-08-12: 40 mg via INTRAVENOUS

## 2020-08-12 MED ORDER — CEFAZOLIN SODIUM-DEXTROSE 2-4 GM/100ML-% IV SOLN
INTRAVENOUS | Status: AC
  Filled 2020-08-12: qty 100

## 2020-08-12 SURGICAL SUPPLY — 32 items
ARMBAND PINK RESTRICT EXTREMIT (MISCELLANEOUS) ×4 IMPLANT
CANISTER SUCT 3000ML PPV (MISCELLANEOUS) ×2 IMPLANT
CLIP VESOCCLUDE MED 6/CT (CLIP) ×2 IMPLANT
CLIP VESOCCLUDE SM WIDE 6/CT (CLIP) ×2 IMPLANT
COVER PROBE W GEL 5X96 (DRAPES) ×2 IMPLANT
COVER WAND RF STERILE (DRAPES) IMPLANT
DECANTER SPIKE VIAL GLASS SM (MISCELLANEOUS) ×2 IMPLANT
DERMABOND ADVANCED (GAUZE/BANDAGES/DRESSINGS) ×1
DERMABOND ADVANCED .7 DNX12 (GAUZE/BANDAGES/DRESSINGS) ×1 IMPLANT
ELECT REM PT RETURN 9FT ADLT (ELECTROSURGICAL) ×2
ELECTRODE REM PT RTRN 9FT ADLT (ELECTROSURGICAL) ×1 IMPLANT
GLOVE BIOGEL PI IND STRL 7.5 (GLOVE) ×1 IMPLANT
GLOVE BIOGEL PI INDICATOR 7.5 (GLOVE) ×1
GLOVE SURG SS PI 7.5 STRL IVOR (GLOVE) ×2 IMPLANT
GOWN STRL REUS W/ TWL LRG LVL3 (GOWN DISPOSABLE) ×2 IMPLANT
GOWN STRL REUS W/ TWL XL LVL3 (GOWN DISPOSABLE) ×1 IMPLANT
GOWN STRL REUS W/TWL LRG LVL3 (GOWN DISPOSABLE) ×2
GOWN STRL REUS W/TWL XL LVL3 (GOWN DISPOSABLE) ×1
HEMOSTAT SNOW SURGICEL 2X4 (HEMOSTASIS) IMPLANT
KIT BASIN OR (CUSTOM PROCEDURE TRAY) ×2 IMPLANT
KIT TURNOVER KIT B (KITS) ×2 IMPLANT
NS IRRIG 1000ML POUR BTL (IV SOLUTION) ×2 IMPLANT
PACK CV ACCESS (CUSTOM PROCEDURE TRAY) ×2 IMPLANT
PAD ARMBOARD 7.5X6 YLW CONV (MISCELLANEOUS) ×4 IMPLANT
SUT PROLENE 6 0 BV (SUTURE) ×2 IMPLANT
SUT PROLENE 6 0 CC (SUTURE) IMPLANT
SUT VIC AB 3-0 SH 27 (SUTURE) ×1
SUT VIC AB 3-0 SH 27X BRD (SUTURE) ×1 IMPLANT
SUT VICRYL 4-0 PS2 18IN ABS (SUTURE) ×2 IMPLANT
TOWEL GREEN STERILE (TOWEL DISPOSABLE) ×2 IMPLANT
UNDERPAD 30X36 HEAVY ABSORB (UNDERPADS AND DIAPERS) ×2 IMPLANT
WATER STERILE IRR 1000ML POUR (IV SOLUTION) ×2 IMPLANT

## 2020-08-12 NOTE — Progress Notes (Signed)
Vital signs monitoring discontinued. Waiting for transport home.

## 2020-08-12 NOTE — Anesthesia Postprocedure Evaluation (Signed)
Anesthesia Post Note  Patient: Kristine Royal Lazcano  Procedure(s) Performed: BRACHIOCEPHALIC ARTERIOVENOUS (AV) FISTULA CREATION RIGHT (Right Arm Upper)     Patient location during evaluation: PACU Anesthesia Type: MAC Level of consciousness: awake and alert Pain management: pain level controlled Vital Signs Assessment: post-procedure vital signs reviewed and stable Respiratory status: spontaneous breathing, nonlabored ventilation and respiratory function stable Cardiovascular status: stable and blood pressure returned to baseline Postop Assessment: no apparent nausea or vomiting Anesthetic complications: no   No complications documented.  Last Vitals:  Vitals:   08/12/20 1241 08/12/20 1256  BP: (!) 132/56 (!) 141/57  Pulse:  66  Resp: 15 14  Temp:  36.4 C  SpO2: 100% 100%    Last Pain:  Vitals:   08/12/20 1223  TempSrc:   PainSc: 0-No pain                 Doraine Schexnider,W. EDMOND

## 2020-08-12 NOTE — Transfer of Care (Signed)
Immediate Anesthesia Transfer of Care Note  Patient: Ann [name]  Procedure(s) Performed: BRACHIOCEPHALIC ARTERIOVENOUS (AV) FISTULA CREATION RIGHT (Right Arm Upper)  Patient Location: PACU  Anesthesia Type:MAC  Level of Consciousness: drowsy  Airway & Oxygen Therapy: Patient Spontanous Breathing and Patient connected to face mask oxygen  Post-op Assessment: Report given to RN and Post -op Vital signs reviewed and stable  Post vital signs: Reviewed and stable  Last Vitals:  Vitals Value Taken Time  BP 156/52 08/12/20 1151  Temp    Pulse 62 08/12/20 1151  Resp 18 08/12/20 1151  SpO2 100 % 08/12/20 1151  Vitals shown include unvalidated device data.  Last Pain:  Vitals:   08/12/20 0902  TempSrc:   PainSc: 0-No pain         Complications: No complications documented.

## 2020-08-12 NOTE — Anesthesia Preprocedure Evaluation (Addendum)
Anesthesia Evaluation  Patient identified by MRN, date of birth, ID band Patient awake    Reviewed: Allergy & Precautions, H&P , NPO status , Patient's Chart, lab work & pertinent test results, reviewed documented beta blocker date and time   Airway Mallampati: II  TM Distance: >3 FB Neck ROM: Full    Dental no notable dental hx. (+) Teeth Intact, Dental Advisory Given   Pulmonary asthma , sleep apnea and Continuous Positive Airway Pressure Ventilation ,    Pulmonary exam normal breath sounds clear to auscultation       Cardiovascular hypertension, Pt. on medications and Pt. on home beta blockers + CAD   Rhythm:Regular Rate:Normal     Neuro/Psych negative neurological ROS  negative psych ROS   GI/Hepatic Neg liver ROS, GERD  Medicated,  Endo/Other  diabetes, Insulin DependentMorbid obesity  Renal/GU CRFRenal disease  negative genitourinary   Musculoskeletal  (+) Arthritis , Osteoarthritis,    Abdominal   Peds  Hematology  (+) Blood dyscrasia, anemia ,   Anesthesia Other Findings   Reproductive/Obstetrics negative OB ROS                            Anesthesia Physical Anesthesia Plan  ASA: III  Anesthesia Plan: MAC   Post-op Pain Management:    Induction: Intravenous  PONV Risk Score and Plan: 3 and Propofol infusion, Ondansetron and Treatment may vary due to age or medical condition  Airway Management Planned: Simple Face Mask  Additional Equipment:   Intra-op Plan:   Post-operative Plan:   Informed Consent: I have reviewed the patients History and Physical, chart, labs and discussed the procedure including the risks, benefits and alternatives for the proposed anesthesia with the patient or authorized representative who has indicated his/her understanding and acceptance.     Dental advisory given  Plan Discussed with: CRNA  Anesthesia Plan Comments:          Anesthesia Quick Evaluation

## 2020-08-12 NOTE — Op Note (Signed)
    Patient name: Ann [name] MRN: [id number] DOB: 1950-05-21 Sex: female  08/12/2020 Pre-operative Diagnosis: End-stage renal disease Post-operative diagnosis:  Same Surgeon:  Annamarie Major Assistants: Marlinda Mike Procedure:   Right brachiocephalic fistula Anesthesia: MAC Blood Loss: Minimal Specimens: None  Findings: 3 to 4 mm cephalic vein heavily calcified brachial artery  Indications: The patient comes in today for fistula creation.  Vein mapping indicated she had an adequate cephalic vein  Procedure:  The patient was identified in the holding area and taken to [place] [facility]  The patient was then placed supine on the table. MAC anesthesia was administered.  The patient was prepped and draped in the usual sterile fashion.  A time out was called and antibiotics were administered.  A PA was necessary to expedite procedure and assist with technical details  Ultrasound was used to evaluate the cephalic vein in the upper arm.  It measured approximately 3 to 4 mm.  1% lidocaine was used for local anesthesia.  A transverse incision was made just proximal to the antecubital crease.  I first dissected out the brachial artery which was circumferentially calcified.  It was encircled proximally and distally with Vesseloops.  I then dissected out the cephalic vein.  This was a 3 to 4 mm vein.  It was mobilized throughout the width of the incision and marked for orientation.  The vein was then ligated distally with a silk tie.  It distended nicely with heparin saline.  Next the brachial artery was occluded with vascular clamps and a #11 blade was used to make an arteriotomy which was extended longitudinally with Potts scissors.  The vein was cut to the appropriate length and spatulated to fit the size the arteriotomy.  A running anastomosis was created with 6-0 Prolene.  Prior to completion the appropriate flushing maneuvers were performed and the anastomosis was completed.  I inspected the course of the  vein to make sure there were no kinks.  There was a palpable thrill within the fistula.  The patient had a triphasic radial and ulnar Doppler signal.  The wound was then irrigated.  Hemostasis was achieved.  The deep tissue was reapproximated with 3-0 Vicryl, and the skin was closed with 3-0 Vicryl followed by Dermabond.  There were no immediate complications.   Disposition: To PACU stable.   Theotis Burrow, M.D., [facility] of [place] Office: [phone] Pager:  [phone]

## 2020-08-12 NOTE — Discharge Instructions (Signed)
° °  Vascular and Vein Specialists of [place] ° °Discharge Instructions ° °AV Fistula or Graft Surgery for Dialysis Access ° °Please refer to the following instructions for your post-procedure care. Your surgeon or physician assistant will discuss any changes with you. ° °Activity ° °You may drive the day following your surgery, if you are comfortable and no longer taking prescription pain medication. Resume full activity as the soreness in your incision resolves. ° °Bathing/Showering ° °You may shower after you go home. Keep your incision dry for 48 hours. Do not soak in a bathtub, hot tub, or swim until the incision heals completely. You may not shower if you have a hemodialysis catheter. ° °Incision Care ° °Clean your incision with mild soap and water after 48 hours. Pat the area dry with a clean towel. You do not need a bandage unless otherwise instructed. Do not apply any ointments or creams to your incision. You may have skin glue on your incision. Do not peel it off. It will come off on its own in about one week. Your arm may swell a bit after surgery. To reduce swelling use pillows to elevate your arm so it is above your heart. Your doctor will tell you if you need to lightly wrap your arm with an ACE bandage. ° °Diet ° °Resume your normal diet. There are not special food restrictions following this procedure. In order to heal from your surgery, it is CRITICAL to get adequate nutrition. Your body requires vitamins, minerals, and protein. Vegetables are the best source of vitamins and minerals. Vegetables also provide the perfect balance of protein. Processed food has little nutritional value, so try to avoid this. ° °Medications ° °Resume taking all of your medications. If your incision is causing pain, you may take over-the counter pain relievers such as acetaminophen (Tylenol). If you were prescribed a stronger pain medication, please be aware these medications can cause nausea and constipation. Prevent  nausea by taking the medication with a snack or meal. Avoid constipation by drinking plenty of fluids and eating foods with high amount of fiber, such as fruits, vegetables, and grains. Do not take Tylenol if you are taking prescription pain medications. ° ° ° ° °Follow up °Your surgeon may want to see you in the office following your access surgery. If so, this will be arranged at the time of your surgery. ° °Please call us immediately for any of the following conditions: ° °Increased pain, redness, drainage (pus) from your incision site °Fever of 101 degrees or higher °Severe or worsening pain at your incision site °Hand pain or numbness. ° °Reduce your risk of vascular disease: ° °Stop smoking. If you would like help, call [facility] at 1-800-QUIT-NOW (1-800-784-8669) or [place] at 336-586-4000 ° °Manage your cholesterol °Maintain a desired weight °Control your diabetes °Keep your blood pressure down ° °Dialysis ° °It will take several weeks to several months for your new dialysis access to be ready for use. Your surgeon will determine when it is OK to use it. Your nephrologist will continue to direct your dialysis. You can continue to use your Permcath until your new access is ready for use. ° °If you have any questions, please call the office at [phone]. ° °

## 2020-08-12 NOTE — Interval H&P Note (Signed)
History and Physical Interval Note:  08/12/2020 9:35 AM  Ann [name]  has presented today for surgery, with the diagnosis of ESRD.  The various methods of treatment have been discussed with the patient and family. After consideration of risks, benefits and other options for treatment, the patient has consented to  Procedure(s): ARTERIOVENOUS (AV) FISTULA CREATION RIGHT (Right) as a surgical intervention.  The patient's history has been reviewed, patient examined, no change in status, stable for surgery.  I have reviewed the patient's chart and labs.  Questions were answered to the patient's satisfaction.     Annamarie Major

## 2020-08-12 NOTE — Anesthesia Procedure Notes (Signed)
Procedure Name: MAC Date/Time: 08/12/2020 10:29 AM Performed by: Colin Benton, CRNA Pre-anesthesia Checklist: Patient identified, Emergency Drugs available, Suction available and Patient being monitored Patient Re-evaluated:Patient Re-evaluated prior to induction Oxygen Delivery Method: Simple face mask Induction Type: IV induction Placement Confirmation: positive ETCO2 Dental Injury: Teeth and Oropharynx as per pre-operative assessment

## 2020-08-13 ENCOUNTER — Encounter (HOSPITAL_COMMUNITY): Payer: Self-pay | Admitting: Surgery

## 2020-08-27 ENCOUNTER — Other Ambulatory Visit: Payer: Self-pay

## 2020-08-27 DIAGNOSIS — N186 End stage renal disease: Secondary | ICD-10-CM

## 2020-08-31 ENCOUNTER — Other Ambulatory Visit: Payer: Self-pay

## 2020-08-31 ENCOUNTER — Telehealth: Payer: Self-pay

## 2020-08-31 DIAGNOSIS — N186 End stage renal disease: Secondary | ICD-10-CM

## 2020-08-31 DIAGNOSIS — Z992 Dependence on renal dialysis: Secondary | ICD-10-CM

## 2020-08-31 NOTE — Telephone Encounter (Signed)
Patient is s/p R AVF on 08/12/20. Per HD, she is having extreme numbness in her right hand and is unable to grip things. Denies wounds. She dialyzes on MWF. Added her on for post op eval and steal study.

## 2020-09-02 ENCOUNTER — Ambulatory Visit (HOSPITAL_COMMUNITY): Payer: Medicare Other | Attending: Vascular Surgery

## 2020-09-12 ENCOUNTER — Encounter: Payer: Self-pay | Admitting: Surgery

## 2020-09-21 ENCOUNTER — Ambulatory Visit (HOSPITAL_COMMUNITY)
Admission: RE | Admit: 2020-09-21 | Discharge: 2020-09-21 | Disposition: A | Discharge status: Home / Self Care | Payer: Medicare Other | Source: Ambulatory Visit | Attending: Vascular Surgery | Admitting: Vascular Surgery

## 2020-09-21 ENCOUNTER — Encounter: Payer: Self-pay | Admitting: Physician Assistant

## 2020-09-21 ENCOUNTER — Ambulatory Visit (INDEPENDENT_AMBULATORY_CARE_PROVIDER_SITE_OTHER): Payer: Medicare Other | Admitting: Physician Assistant

## 2020-09-21 ENCOUNTER — Other Ambulatory Visit: Payer: Self-pay

## 2020-09-21 VITALS — BP 142/67 | HR 73 | Temp 98.3°F | Resp 20 | Ht 62.0 in | Wt 196.0 lb

## 2020-09-21 DIAGNOSIS — N186 End stage renal disease: Secondary | ICD-10-CM

## 2020-09-21 DIAGNOSIS — Z992 Dependence on renal dialysis: Secondary | ICD-10-CM

## 2020-09-21 NOTE — H&P (View-Only) (Signed)
    Postoperative Access Visit   History of Present Illness   Ann [name] is a 70 y.o. year old female who presents for postoperative follow-up for right brachiocephalic fistula 5/78/46 by Dr. Trula Slade. The patient's wounds are well healed.  The patient notes some steal symptoms.  The patient's current symptoms are coldness, numbness and tingling, weakness of right hand. She says she has been using a squeeze ball at dialysis but at times her discomfort in the hand makes her have to stop.  She Dialyzes on MWF at [facility] location via left IJ [facility]  Physical Examination   Vitals:   09/21/20 1334  BP: (!) 142/67  Pulse: 73  Resp: 20  Temp: 98.3 F (36.8 C)  TempSrc: Temporal  SpO2: 100%  Weight: 196 lb (88.9 kg)  Height: 5\' 2"  (1.575 m)   Body mass index is 35.85 kg/m.  right arm Incision is  healed, radial pulse not palpable, hand grip is 5/5, sensation in digits is intact, palpable thrill, bruit can be auscultated. Some pulsatility in fistula is present. The fistula is deep in the right upper arm   Non Invasive Vascular lab: 09/21/20 Findings:  +--------------------+----------+-----------------+--------+  AVF         PSV (cm/s)Flow Vol (mL/min)Comments  +--------------------+----------+-----------------+--------+  Native artery inflow  198      389          +--------------------+----------+-----------------+--------+  AVF Anastomosis     415                 +--------------------+----------+-----------------+--------+   +------------+----------+-------------+----------+----------------+  OUTFLOW VEINPSV (cm/s)Diameter (cm)Depth (cm)  Describe    +------------+----------+-------------+----------+----------------+  Shoulder    259    0.68     1.37            +------------+----------+-------------+----------+----------------+  Prox UA     215    0.56     0.58  competing  branch  +------------+----------+-------------+----------+----------------+  Mid UA     209    0.66     0.86            +------------+----------+-------------+----------+----------------+  Dist UA     487    0.58     0.94            +------------+----------+-------------+----------+----------------+  AC Fossa    399    0.42     0.95            +------------+----------+-------------+----------+----------------+   Medical Decision Making    Ann [name] is a 70 y.o. year old female who presents s/p right brachiocephalic fistula 9/62/95 by Dr. Trula Slade. Her duplex today shows that her fistula has not fully matured and it is also deep in the right upper arm. She is having some steal symptoms but they are presently manageable. I discussed with her that if they become intolerable she likely wound need a left arm graft as her pre operative vein mapping showed very small left upper extremity veins. I have recommended that she have a 2nd stage transposition due to depth of fistula in her right arm. Risk, benefits, and alternatives to access surgery were discussed. The patient agrees to proceed with the procedure. I have encouraged her to continue to exercise her right arm. She is on Eliquis which she will need to hold for 3 days.  I will have her 2nd stage BC fistula transposition arranged with Dr. Marcell Anger, PA-C [facility] [place] Office: [phone]  Clinic MD: Roxanne Mins

## 2020-09-21 NOTE — Progress Notes (Signed)
    Postoperative Access Visit   History of Present Illness   Ann [name] is a 70 y.o. year old female who presents for postoperative follow-up for right brachiocephalic fistula 0/16/01 by Dr. Trula Slade. The patient's wounds are well healed.  The patient notes some steal symptoms.  The patient's current symptoms are coldness, numbness and tingling, weakness of right hand. She says she has been using a squeeze ball at dialysis but at times her discomfort in the hand makes her have to stop.  She Dialyzes on MWF at [facility] location via left IJ [facility]  Physical Examination   Vitals:   09/21/20 1334  BP: (!) 142/67  Pulse: 73  Resp: 20  Temp: 98.3 F (36.8 C)  TempSrc: Temporal  SpO2: 100%  Weight: 196 lb (88.9 kg)  Height: 5\' 2"  (1.575 m)   Body mass index is 35.85 kg/m.  right arm Incision is  healed, radial pulse not palpable, hand grip is 5/5, sensation in digits is intact, palpable thrill, bruit can be auscultated. Some pulsatility in fistula is present. The fistula is deep in the right upper arm   Non Invasive Vascular lab: 09/21/20 Findings:  +--------------------+----------+-----------------+--------+  AVF         PSV (cm/s)Flow Vol (mL/min)Comments  +--------------------+----------+-----------------+--------+  Native artery inflow  198      389          +--------------------+----------+-----------------+--------+  AVF Anastomosis     415                 +--------------------+----------+-----------------+--------+   +------------+----------+-------------+----------+----------------+  OUTFLOW VEINPSV (cm/s)Diameter (cm)Depth (cm)  Describe    +------------+----------+-------------+----------+----------------+  Shoulder    259    0.68     1.37            +------------+----------+-------------+----------+----------------+  Prox UA     215    0.56     0.58  competing  branch  +------------+----------+-------------+----------+----------------+  Mid UA     209    0.66     0.86            +------------+----------+-------------+----------+----------------+  Dist UA     487    0.58     0.94            +------------+----------+-------------+----------+----------------+  AC Fossa    399    0.42     0.95            +------------+----------+-------------+----------+----------------+   Medical Decision Making    Ann [name] is a 70 y.o. year old female who presents s/p right brachiocephalic fistula 0/93/23 by Dr. Trula Slade. Her duplex today shows that her fistula has not fully matured and it is also deep in the right upper arm. She is having some steal symptoms but they are presently manageable. I discussed with her that if they become intolerable she likely wound need a left arm graft as her pre operative vein mapping showed very small left upper extremity veins. I have recommended that she have a 2nd stage transposition due to depth of fistula in her right arm. Risk, benefits, and alternatives to access surgery were discussed. The patient agrees to proceed with the procedure. I have encouraged her to continue to exercise her right arm. She is on Eliquis which she will need to hold for 3 days.  I will have her 2nd stage BC fistula transposition arranged with Dr. Marcell Anger, PA-C [facility] [place] Office: [phone]  Clinic MD: Roxanne Mins

## 2020-09-23 ENCOUNTER — Other Ambulatory Visit: Payer: Self-pay

## 2020-10-13 ENCOUNTER — Encounter (HOSPITAL_COMMUNITY): Payer: Self-pay | Admitting: Surgery

## 2020-10-13 NOTE — Progress Notes (Signed)
DUE TO COVID-19 ONLY ONE VISITOR IS ALLOWED TO COME WITH YOU AND STAY IN THE WAITING ROOM ONLY DURING PRE OP AND PROCEDURE DAY OF SURGERY.   PCP - Theresa Mulligan Cardiologist - Dr Gwenlyn Found (last seen 2018)  Chest x-ray - 04/11/20 (1V) EKG - 04/12/20 Stress Test - 10/2011 ECHO - 04/04/20 Cardiac Cath - 2010  Sleep Study -  Yes CPAP - none  Fasting Blood Sugar - 90s-120s Checks Blood Sugar 4 times a day  Do not take tradjenta on the morning of surgery.  THE NIGHT BEFORE SURGERY, take 6 units of Toujeo insulin.      THE MORNING OF SURGERY, do not take Humalog Insulin unless your CBG is greater than 220 mg/dL, then you may take  of your sliding scale (correction) dose of insulin.  If your blood sugar is less than 70 mg/dL, you will need to treat for low blood sugar: Treat a low blood sugar (less than 70 mg/dL) with  cup of clear juice (cranberry or apple), 4 glucose tablets, OR glucose gel. Recheck blood sugar in 15 minutes after treatment (to make sure it is greater than 70 mg/dL). If your blood sugar is not greater than 70 mg/dL on recheck, call [phone] for further instructions.  Blood Thinner Instructions:  Last dose of Eliquis was on 10/11/20..  Anesthesia review: Yes  STOP now taking any Aspirin (unless otherwise instructed by your surgeon), Aleve, Naproxen, Ibuprofen, Motrin, Advil, Goody's, BC's, all herbal medications, fish oil, and all vitamins.   Coronavirus Screening Covid test n/a - Ambulatory Surgery  Do you have any of the following symptoms:  Cough yes/no: No Fever (>100.64F)  yes/no: No Runny nose Yes Sore throat yes/no: No Difficulty breathing/shortness of breath  yes/no: No  Have you traveled in the last 14 days and where? yes/no: No  Patient verbalized understanding of instructions that were given via phone.

## 2020-10-13 NOTE — Anesthesia Preprocedure Evaluation (Addendum)
Anesthesia Evaluation  Patient identified by MRN, date of birth, ID band Patient awake    Reviewed: Allergy & Precautions, NPO status , Patient's Chart, lab work & pertinent test results, reviewed documented beta blocker date and time   Airway Mallampati: II  TM Distance: >3 FB Neck ROM: Full    Dental  (+) Dental Advisory Given, Missing   Pulmonary asthma , sleep apnea and Continuous Positive Airway Pressure Ventilation ,    Pulmonary exam normal breath sounds clear to auscultation       Cardiovascular hypertension, Pt. on medications and Pt. 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)  Normal cardiovascular exam Rhythm:Regular Rate:Normal     Neuro/Psych negative neurological ROS  negative psych ROS   GI/Hepatic Neg liver ROS, GERD  Medicated,  Endo/Other  diabetes, Type 1, Insulin Dependent, Oral Hypoglycemic AgentsObesity   Renal/GU CRFRenal disease     Musculoskeletal  (+) Arthritis ,   Abdominal   Peds  Hematology  (+) Blood dyscrasia (Eliquis), ,   Anesthesia Other Findings   Reproductive/Obstetrics                           Anesthesia Physical Anesthesia Plan  ASA: 3  Anesthesia Plan: General   Post-op Pain Management:    Induction: Intravenous  PONV Risk Score and Plan: 3 and Dexamethasone and Ondansetron  Airway Management Planned: LMA  Additional Equipment:   Intra-op Plan:   Post-operative Plan: Extubation in OR  Informed Consent: I have reviewed the patients History and Physical, chart, labs and discussed the procedure including the risks, benefits and alternatives for the proposed anesthesia with the patient or authorized representative who has indicated his/her understanding and acceptance.     Dental advisory given  Plan Discussed with: CRNA  Anesthesia Plan Comments:        Anesthesia Quick Evaluation

## 2020-10-14 ENCOUNTER — Encounter (HOSPITAL_COMMUNITY)
Admission: RE | Disposition: A | Discharge status: Home / Self Care | Payer: Self-pay | Source: Home / Self Care | Attending: Surgery

## 2020-10-14 ENCOUNTER — Encounter (HOSPITAL_COMMUNITY): Payer: Self-pay | Admitting: Surgery

## 2020-10-14 ENCOUNTER — Ambulatory Visit (HOSPITAL_COMMUNITY)
Admission: RE | Admit: 2020-10-14 | Discharge: 2020-10-14 | Disposition: A | Discharge status: Home / Self Care | Payer: Medicare Other | Attending: Surgery | Admitting: Surgery

## 2020-10-14 ENCOUNTER — Other Ambulatory Visit: Payer: Self-pay

## 2020-10-14 ENCOUNTER — Ambulatory Visit (HOSPITAL_COMMUNITY): Payer: Medicare Other | Admitting: Physician Assistant

## 2020-10-14 DIAGNOSIS — N186 End stage renal disease: Secondary | ICD-10-CM | POA: Insufficient documentation

## 2020-10-14 DIAGNOSIS — Y832 Surgical operation with anastomosis, bypass or graft as the cause of abnormal reaction of the patient, or of later complication, without mention of misadventure at the time of the procedure: Secondary | ICD-10-CM

## 2020-10-14 DIAGNOSIS — Z7901 Long term (current) use of anticoagulants: Secondary | ICD-10-CM | POA: Insufficient documentation

## 2020-10-14 DIAGNOSIS — T82898A Other specified complication of vascular prosthetic devices, implants and grafts, initial encounter: Secondary | ICD-10-CM

## 2020-10-14 DIAGNOSIS — Z992 Dependence on renal dialysis: Secondary | ICD-10-CM | POA: Diagnosis not present

## 2020-10-14 HISTORY — PX: BASCILIC VEIN TRANSPOSITION: SHX5742

## 2020-10-14 LAB — POCT I-STAT, CHEM 8
BUN: 11 mg/dL (ref 8–23)
Calcium, Ion: 1.21 mmol/L (ref 1.15–1.40)
Chloride: 99 mmol/L (ref 98–111)
Creatinine, Ser: 3.4 mg/dL — ABNORMAL HIGH (ref 0.44–1.00)
Glucose, Bld: 161 mg/dL — ABNORMAL HIGH (ref 70–99)
HCT: 30 % — ABNORMAL LOW (ref 36.0–46.0)
Hemoglobin: 10.2 g/dL — ABNORMAL LOW (ref 12.0–15.0)
Potassium: 4 mmol/L (ref 3.5–5.1)
Sodium: 138 mmol/L (ref 135–145)
TCO2: 28 mmol/L (ref 22–32)

## 2020-10-14 LAB — GLUCOSE, CAPILLARY
Glucose-Capillary: 136 mg/dL — ABNORMAL HIGH (ref 70–99)
Glucose-Capillary: 156 mg/dL — ABNORMAL HIGH (ref 70–99)

## 2020-10-14 SURGERY — TRANSPOSITION, VEIN, BASILIC
Anesthesia: General | Site: Arm Upper | Laterality: Right

## 2020-10-14 MED ORDER — CEFAZOLIN SODIUM-DEXTROSE 2-4 GM/100ML-% IV SOLN
INTRAVENOUS | Status: AC
  Filled 2020-10-14: qty 100

## 2020-10-14 MED ORDER — FENTANYL CITRATE (PF) 100 MCG/2ML IJ SOLN: 25.0000 ug | INTRAMUSCULAR | Status: DC | PRN

## 2020-10-14 MED ORDER — BUPIVACAINE HCL (PF) 0.5 % IJ SOLN
INTRAMUSCULAR | Status: AC
  Filled 2020-10-14: qty 30

## 2020-10-14 MED ORDER — PROPOFOL 10 MG/ML IV BOLUS
INTRAVENOUS | Status: DC | PRN
  Administered 2020-10-14: 140 mg via INTRAVENOUS
  Administered 2020-10-14: 30 mg via INTRAVENOUS

## 2020-10-14 MED ORDER — 0.9 % SODIUM CHLORIDE (POUR BTL) OPTIME
TOPICAL | Status: DC | PRN
  Administered 2020-10-14: 1000 mL

## 2020-10-14 MED ORDER — CEFAZOLIN SODIUM-DEXTROSE 2-4 GM/100ML-% IV SOLN
2.0000 g | INTRAVENOUS | Status: AC
  Administered 2020-10-14: 2 g via INTRAVENOUS

## 2020-10-14 MED ORDER — BUPIVACAINE LIPOSOME 1.3 % IJ SUSP
INTRAMUSCULAR | Status: DC | PRN
  Administered 2020-10-14: 50 mL

## 2020-10-14 MED ORDER — FENTANYL CITRATE (PF) 250 MCG/5ML IJ SOLN
INTRAMUSCULAR | Status: AC
  Filled 2020-10-14: qty 5

## 2020-10-14 MED ORDER — CHLORHEXIDINE GLUCONATE 4 % EX LIQD: 60.0000 mL | Freq: Once | CUTANEOUS | Status: DC

## 2020-10-14 MED ORDER — METOPROLOL TARTRATE 12.5 MG HALF TABLET
ORAL_TABLET | ORAL | Status: AC
  Filled 2020-10-14: qty 2

## 2020-10-14 MED ORDER — ONDANSETRON HCL 4 MG/2ML IJ SOLN
INTRAMUSCULAR | Status: DC | PRN
  Administered 2020-10-14: 4 mg via INTRAVENOUS

## 2020-10-14 MED ORDER — ONDANSETRON HCL 4 MG/2ML IJ SOLN: 4.0000 mg | Freq: Once | INTRAMUSCULAR | Status: DC | PRN

## 2020-10-14 MED ORDER — BUPIVACAINE LIPOSOME 1.3 % IJ SUSP
INTRAMUSCULAR | Status: AC
  Filled 2020-10-14: qty 20

## 2020-10-14 MED ORDER — EPHEDRINE 5 MG/ML INJ
INTRAVENOUS | Status: AC
  Filled 2020-10-14: qty 10

## 2020-10-14 MED ORDER — SODIUM CHLORIDE 0.9 % IV SOLN
INTRAVENOUS | Status: AC
  Filled 2020-10-14: qty 1.2

## 2020-10-14 MED ORDER — FENTANYL CITRATE (PF) 250 MCG/5ML IJ SOLN
INTRAMUSCULAR | Status: DC | PRN
  Administered 2020-10-14 (×3): 25 ug via INTRAVENOUS

## 2020-10-14 MED ORDER — PROPOFOL 10 MG/ML IV BOLUS
INTRAVENOUS | Status: AC
  Filled 2020-10-14: qty 20

## 2020-10-14 MED ORDER — METOPROLOL TARTRATE 12.5 MG HALF TABLET
25.0000 mg | ORAL_TABLET | Freq: Once | ORAL | Status: AC
  Administered 2020-10-14: 25 mg via ORAL

## 2020-10-14 MED ORDER — ACETAMINOPHEN 500 MG PO TABS
1000.0000 mg | ORAL_TABLET | Freq: Once | ORAL | Status: AC
  Administered 2020-10-14: 1000 mg via ORAL
  Filled 2020-10-14: qty 2

## 2020-10-14 MED ORDER — GLYCOPYRROLATE PF 0.2 MG/ML IJ SOSY
PREFILLED_SYRINGE | INTRAMUSCULAR | Status: DC | PRN
  Administered 2020-10-14: .1 mg via INTRAVENOUS

## 2020-10-14 MED ORDER — SODIUM CHLORIDE 0.9 % IV SOLN: INTRAVENOUS | Status: DC

## 2020-10-14 MED ORDER — EPHEDRINE SULFATE 50 MG/ML IJ SOLN
INTRAMUSCULAR | Status: DC | PRN
  Administered 2020-10-14 (×2): 10 mg via INTRAVENOUS

## 2020-10-14 MED ORDER — HYDROCODONE-ACETAMINOPHEN 5-325 MG PO TABS: 1.0000 | ORAL_TABLET | ORAL | 0 refills | Status: DC | PRN

## 2020-10-14 MED ORDER — CHLORHEXIDINE GLUCONATE 0.12 % MT SOLN
OROMUCOSAL | Status: AC
  Administered 2020-10-14: 15 mL
  Filled 2020-10-14: qty 15

## 2020-10-14 MED ORDER — SODIUM CHLORIDE 0.9 % IV SOLN
INTRAVENOUS | Status: DC | PRN
  Administered 2020-10-14: 500 mL

## 2020-10-14 MED ORDER — LIDOCAINE 2% (20 MG/ML) 5 ML SYRINGE
INTRAMUSCULAR | Status: DC | PRN
  Administered 2020-10-14: 80 mg via INTRAVENOUS

## 2020-10-14 SURGICAL SUPPLY — 45 items
ARMBAND PINK RESTRICT EXTREMIT (MISCELLANEOUS) ×3 IMPLANT
CANISTER SUCT 3000ML PPV (MISCELLANEOUS) ×3 IMPLANT
CLIP VESOCCLUDE MED 24/CT (CLIP) IMPLANT
CLIP VESOCCLUDE MED 6/CT (CLIP) IMPLANT
CLIP VESOCCLUDE SM WIDE 24/CT (CLIP) IMPLANT
CLIP VESOCCLUDE SM WIDE 6/CT (CLIP) IMPLANT
COVER PROBE W GEL 5X96 (DRAPES) ×3 IMPLANT
DERMABOND ADHESIVE PROPEN (GAUZE/BANDAGES/DRESSINGS) ×2
DERMABOND ADVANCED (GAUZE/BANDAGES/DRESSINGS) ×4
DERMABOND ADVANCED .7 DNX12 (GAUZE/BANDAGES/DRESSINGS) ×2 IMPLANT
DERMABOND ADVANCED .7 DNX6 (GAUZE/BANDAGES/DRESSINGS) ×1 IMPLANT
ELECT REM PT RETURN 9FT ADLT (ELECTROSURGICAL) ×3
ELECTRODE REM PT RTRN 9FT ADLT (ELECTROSURGICAL) ×1 IMPLANT
GLOVE SURG POLYISO LF SZ7.5 (GLOVE) ×3 IMPLANT
GLOVE SURG UNDER POLY LF SZ6.5 (GLOVE) ×12 IMPLANT
GLOVE SURG UNDER POLY LF SZ7.5 (GLOVE) ×3 IMPLANT
GLOVE SURG UNDER POLY LF SZ9 (GLOVE) ×3 IMPLANT
GOWN STRL REUS W/ TWL LRG LVL3 (GOWN DISPOSABLE) ×4 IMPLANT
GOWN STRL REUS W/ TWL XL LVL3 (GOWN DISPOSABLE) ×1 IMPLANT
GOWN STRL REUS W/TWL LRG LVL3 (GOWN DISPOSABLE) ×8
GOWN STRL REUS W/TWL XL LVL3 (GOWN DISPOSABLE) ×2
HEMOSTAT SNOW SURGICEL 2X4 (HEMOSTASIS) IMPLANT
KIT BASIN OR (CUSTOM PROCEDURE TRAY) ×3 IMPLANT
KIT TURNOVER KIT B (KITS) ×3 IMPLANT
NEEDLE 18GX1X1/2 (RX/OR ONLY) (NEEDLE) ×3 IMPLANT
NS IRRIG 1000ML POUR BTL (IV SOLUTION) ×3 IMPLANT
PACK CV ACCESS (CUSTOM PROCEDURE TRAY) ×3 IMPLANT
PAD ARMBOARD 7.5X6 YLW CONV (MISCELLANEOUS) ×6 IMPLANT
PATCH VASC XENOSURE 1CMX6CM (Vascular Products) ×2 IMPLANT
PATCH VASC XENOSURE 1X6 (Vascular Products) ×1 IMPLANT
SPONGE LAP 18X18 RF (DISPOSABLE) ×3 IMPLANT
SUT PROLENE 5 0 C 1 24 (SUTURE) ×3 IMPLANT
SUT PROLENE 6 0 BV (SUTURE) ×6 IMPLANT
SUT PROLENE 6 0 CC (SUTURE) ×3 IMPLANT
SUT SILK 2 0 SH (SUTURE) IMPLANT
SUT SILK 3 0 (SUTURE) ×2
SUT SILK 3-0 18XBRD TIE 12 (SUTURE) ×1 IMPLANT
SUT VIC AB 3-0 SH 27 (SUTURE) ×6
SUT VIC AB 3-0 SH 27X BRD (SUTURE) ×3 IMPLANT
SUT VIC AB 4-0 PS2 18 (SUTURE) ×3 IMPLANT
SUT VICRYL 4-0 PS2 18IN ABS (SUTURE) ×3 IMPLANT
SYR 30ML LL (SYRINGE) ×3 IMPLANT
TOWEL GREEN STERILE (TOWEL DISPOSABLE) ×3 IMPLANT
UNDERPAD 30X36 HEAVY ABSORB (UNDERPADS AND DIAPERS) ×3 IMPLANT
WATER STERILE IRR 1000ML POUR (IV SOLUTION) ×3 IMPLANT

## 2020-10-14 NOTE — Anesthesia Postprocedure Evaluation (Signed)
Anesthesia Post Note  Patient: Ann [name]  Procedure(s) Performed: RIGHT ARM SECOND STAGE BRACHIOCEPHALIC FISTULA TRANSPOSITION (Right: Arm Upper)     Patient location during evaluation: PACU Anesthesia Type: General Level of consciousness: awake and alert Pain management: pain level controlled Vital Signs Assessment: post-procedure vital signs reviewed and stable Respiratory status: spontaneous breathing, nonlabored ventilation, respiratory function stable and patient connected to nasal cannula oxygen Cardiovascular status: blood pressure returned to baseline and stable Postop Assessment: no apparent nausea or vomiting Anesthetic complications: no   No notable events documented.  Last Vitals:  Vitals:   10/14/20 0938 10/14/20 0953  BP: 138/73 137/69  Pulse: 75 73  Resp: 13 13  Temp:  36.4 C  SpO2: 99% 97%    Last Pain:  Vitals:   10/14/20 0953  TempSrc:   PainSc: 0-No pain                 Catalina Gravel

## 2020-10-14 NOTE — Op Note (Signed)
    Patient name: Ann [name] MRN: [id number] DOB: 03-Nov-1950 Sex: female  10/14/2020 Pre-operative Diagnosis: End-stage renal disease Post-operative diagnosis:  Same Surgeon:  Annamarie Major Assistants: Risa Grill Procedure:   #1: Revision of right brachiocephalic fistula with elevation and branch ligation   #2: Banding of proximal portion of fistula Anesthesia: General Blood Loss: Minimal Specimens: None  Findings: The fistula is of adequate diameter measuring 5 to 6 mm.  A bovine pericardial patch was placed as a band on the proximal fistula.  This narrowed the inflow to the fistula by approximately 40 to 50% with a significant improvement in the Doppler signal of the radial artery.  Indications: This is a 70 year old female who was undergone a right brachiocephalic fistula which has matured however it remains too deep for cannulation.  She also complains of mild steal symptoms.  I discussed proceeding with elevation and banding.  Procedure:  The patient was identified in the holding area and taken to [place] [facility]  The patient was then placed supine on the table. general anesthesia was administered.  The patient was prepped and draped in the usual sterile fashion.  A time out was called and antibiotics were administered.  A PA was necessary to expedite procedure and assist with technical details.  Ultrasound was used to evaluate the fistula throughout the upper arm.  It was widely patent and have adequate diameter.  2 longitudinal incisions were made over top of the fistula with a 10 blade.  Cautery and sharp dissection were used to mobilize the fistula from the antecubital crease up to the axilla.  Multiple side branches were ligated between silk ties.  After the fistula was fully mobilized, I evaluated the radial artery with Doppler signal.  With narrowing the fistula by approximately 50%, there was a significant improvement in the signal of the radial artery at the wrist.  I therefore  elected to band the proximal fistula.  A bovine pericardial patch was selected and wrapped around the fistula and sutured in the position with 5-0 Prolene until I had achieved the appropriate diameter reduction.  This was confirmed again by lifting to the radial artery at the wrist which had a triphasic signal.  Next, the wounds were irrigated.  Hemostasis was achieved.  I closed the subcutaneous tissue posterior to the fistula with interrupted 3-0 Vicryl.  The skin was closed directly anterior to the fistula with Vicryl followed by Dermabond.  There were no immediate complications.   Disposition: To PACU stable.   Theotis Burrow, M.D., [facility] [place] Office: [phone] Pager:  [phone]

## 2020-10-14 NOTE — Transfer of Care (Signed)
Immediate Anesthesia Transfer of Care Note  Patient: Ann [name]  Procedure(s) Performed: RIGHT ARM SECOND STAGE BRACHIOCEPHALIC FISTULA TRANSPOSITION (Right: Arm Upper)  Patient Location: PACU  Anesthesia Type:General  Level of Consciousness: awake, alert  and oriented  Airway & Oxygen Therapy: Patient Spontanous Breathing and Patient connected to nasal cannula oxygen  Post-op Assessment: Report given to RN and Post -op Vital signs reviewed and stable  Post vital signs: Reviewed and stable  Last Vitals:  Vitals Value Taken Time  BP 150/66 10/14/20 0923  Temp    Pulse 73 10/14/20 0926  Resp 24 10/14/20 0926  SpO2 100 % 10/14/20 0926  Vitals shown include unvalidated device data.  Last Pain:  Vitals:   10/14/20 0652  TempSrc:   PainSc: 8       Patients Stated Pain Goal: 5 (03/52/48 1859)  Complications: No notable events documented.

## 2020-10-14 NOTE — Interval H&P Note (Signed)
History and Physical Interval Note:  10/14/2020 7:25 AM  Ann [name]  has presented today for surgery, with the diagnosis of ESRD.  The various methods of treatment have been discussed with the patient and family. After consideration of risks, benefits and other options for treatment, the patient has consented to  Procedure(s): [place] (Right) as a surgical intervention.  The patient's history has been reviewed, patient examined, no change in status, stable for surgery.  I have reviewed the patient's chart and labs.  Questions were answered to the patient's satisfaction.     Wells Nylee Barbuto  Discussed banding of the fistula to help with mild steal.  All questions answered.  WB

## 2020-10-14 NOTE — Anesthesia Procedure Notes (Signed)
Procedure Name: LMA Insertion Date/Time: 10/14/2020 7:39 AM Performed by: Trinna Post., CRNA Pre-anesthesia Checklist: Patient identified, Emergency Drugs available, Suction available, Patient being monitored and Timeout performed Patient Re-evaluated:Patient Re-evaluated prior to induction Oxygen Delivery Method: Circle system utilized Preoxygenation: Pre-oxygenation with 100% oxygen Induction Type: IV induction LMA: LMA inserted LMA Size: 4.0 Number of attempts: 1 Placement Confirmation: positive ETCO2 and breath sounds checked- equal and bilateral Tube secured with: Tape Dental Injury: Teeth and Oropharynx as per pre-operative assessment

## 2020-10-14 NOTE — Discharge Instructions (Signed)
° °  [facility] [place] ° °Discharge Instructions ° °AV Fistula or Graft Surgery for Dialysis Access ° °Please refer to the following instructions for your post-procedure care. Your surgeon or physician assistant will discuss any changes with you. ° °Activity ° °You may drive the day following your surgery, if you are comfortable and no longer taking prescription pain medication. Resume full activity as the soreness in your incision resolves. ° °Bathing/Showering ° °You may shower after you go home. Keep your incision dry for 48 hours. Do not soak in a bathtub, hot tub, or swim until the incision heals completely. You may not shower if you have a hemodialysis catheter. ° °Incision Care ° °Clean your incision with mild soap and water after 48 hours. Pat the area dry with a clean towel. You do not need a bandage unless otherwise instructed. Do not apply any ointments or creams to your incision. You may have skin glue on your incision. Do not peel it off. It will come off on its own in about one week. Your arm may swell a bit after surgery. To reduce swelling use pillows to elevate your arm so it is above your heart. Your doctor will tell you if you need to lightly wrap your arm with an ACE bandage. ° °Diet ° °Resume your normal diet. There are not special food restrictions following this procedure. In order to heal from your surgery, it is CRITICAL to get adequate nutrition. Your body requires vitamins, minerals, and protein. Vegetables are the best source of vitamins and minerals. Vegetables also provide the perfect balance of protein. Processed food has little nutritional value, so try to avoid this. ° °Medications ° °Resume taking all of your medications. If your incision is causing pain, you may take over-the counter pain relievers such as acetaminophen (Tylenol). If you were prescribed a stronger pain medication, please be aware these medications can cause nausea and constipation. Prevent  nausea by taking the medication with a snack or meal. Avoid constipation by drinking plenty of fluids and eating foods with high amount of fiber, such as fruits, vegetables, and grains. Do not take Tylenol if you are taking prescription pain medications. ° ° ° ° °Follow up °Your surgeon may want to see you in the office following your access surgery. If so, this will be arranged at the time of your surgery. ° °Please call us immediately for any of the following conditions: ° °Increased pain, redness, drainage (pus) from your incision site °Fever of 101 degrees or higher °Severe or worsening pain at your incision site °Hand pain or numbness. ° °Reduce your risk of vascular disease: ° °Stop smoking. If you would like help, call [facility] at 1-800-QUIT-NOW (1-800-784-8669) or [place] at 336-586-4000 ° °Manage your cholesterol °Maintain a desired weight °Control your diabetes °Keep your blood pressure down ° °Dialysis ° °It will take several weeks to several months for your new dialysis access to be ready for use. Your surgeon will determine when it is OK to use it. Your nephrologist will continue to direct your dialysis. You can continue to use your Permcath until your new access is ready for use. ° °If you have any questions, please call the office at [phone]. ° °

## 2020-10-15 ENCOUNTER — Encounter (HOSPITAL_COMMUNITY): Payer: Self-pay | Admitting: Surgery

## 2020-10-19 NOTE — H&P (Signed)
Postoperative Access Visit     History of Present Illness    Ann [name] is a 70 y.o. year old female who presents for postoperative follow-up for right brachiocephalic fistula 08/15/51 by Dr. Trula Slade. The patient's wounds are well healed.  The patient notes some steal symptoms.  The patient's current symptoms are coldness, numbness and tingling, weakness of right hand. She says she has been using a squeeze ball at dialysis but at times her discomfort in the hand makes her have to stop.  She Dialyzes on MWF at [facility] location via left IJ [facility]   Physical Examination       Vitals:    09/21/20 1334  BP: (!) 142/67  Pulse: 73  Resp: 20  Temp: 98.3 F (36.8 C)  TempSrc: Temporal  SpO2: 100%  Weight: 196 lb (88.9 kg)  Height: 5\' 2"  (1.575 m)    Body mass index is 35.85 kg/m.   right arm Incision is  healed, radial pulse not palpable, hand grip is 5/5, sensation in digits is intact, palpable thrill, bruit can be auscultated. Some pulsatility in fistula is present. The fistula is deep in the right upper arm    Non Invasive Vascular lab: 09/21/20 Findings:  +--------------------+----------+-----------------+--------+  AVF                 PSV (cm/s)Flow Vol (mL/min)Comments  +--------------------+----------+-----------------+--------+  Native artery inflow   198           389                 +--------------------+----------+-----------------+--------+  AVF Anastomosis        415                               +--------------------+----------+-----------------+--------+   +------------+----------+-------------+----------+----------------+  OUTFLOW VEINPSV (cm/s)Diameter (cm)Depth (cm)    Describe      +------------+----------+-------------+----------+----------------+  Shoulder       259        0.68        1.37                     +------------+----------+-------------+----------+----------------+  Prox UA        215        0.56         0.58   competing branch  +------------+----------+-------------+----------+----------------+  Mid UA         209        0.66        0.86                     +------------+----------+-------------+----------+----------------+  Dist UA        487        0.58        0.94                     +------------+----------+-------------+----------+----------------+  AC Fossa       399        0.42        0.95                     +------------+----------+-------------+----------+----------------+   Medical Decision Making    Ann [name] is a 70 y.o. year old female who presents s/p right brachiocephalic fistula 10/05/41 by Dr. Trula Slade. Her duplex today shows that her fistula has not fully matured  and it is also deep in the right upper arm. She is having some steal symptoms but they are presently manageable. I discussed with her that if they become intolerable she likely wound need a left arm graft as her pre operative vein mapping showed very small left upper extremity veins. I have recommended that she have a 2nd stage transposition due to depth of fistula in her right arm. Risk, benefits, and alternatives to access surgery were discussed. The patient agrees to proceed with the procedure. I have encouraged her to continue to exercise her right arm. She is on Eliquis which she will need to hold for 3 days.  I will have her 2nd stage BC fistula transposition arranged with Dr. Marcell Anger, PA-C [facility] [place] Office: [phone]   Clinic MD: Roxanne Mins

## 2020-11-01 NOTE — Progress Notes (Signed)
POST OPERATIVE OFFICE NOTE    CC:  F/u for surgery  HPI:  This is a 70 y.o. female who is s/p right BC AVF on 08/12/2020 by Dr. Trula Slade.  At her visit on 09/21/2020, she was having some steal sx of coldness, numbness and tingling and weakness of the right hand.  Her fistula had matured nicely but was deep.     She subsequently underwent revision of right BC AVF with superficialization and branch ligation as well as banding of proximal portion of fistula on 10/14/2020 by Dr. Trula Slade.    Pt states the pain in her right hand has improved.  She has occasional numbness and sometimes when her fingers are cold, she says the tips are blue.    The pt is on dialysis M/W/F at [facility] location.   Allergies  Allergen Reactions   Codeine Nausea And Vomiting   Metronidazole Itching    Current Outpatient Medications  Medication Sig Dispense Refill   ACCU-CHEK FASTCLIX LANCETS MISC USE AS DIRECTED TO CHECK BLOOD SUGAR FOUR TIMES A DAY(BEFORE MEALS AND AT BEDTIME). 102 each 9   ACCU-CHEK SMARTVIEW test strip CHECK BLOOD SUGAR BEFORE MEALS AND AT BEDTIME 150 each 9   acetaminophen (TYLENOL) 325 MG tablet Take 650 mg by mouth every 4 (four) hours as needed for moderate pain.     albuterol (VENTOLIN HFA) 108 (90 Base) MCG/ACT inhaler Inhale 2 puffs into the lungs every 4 (four) hours as needed for wheezing or shortness of breath. 6.7 g 0   amLODipine (NORVASC) 10 MG tablet Take 10 mg by mouth in the morning.     apixaban (ELIQUIS) 5 MG TABS tablet Take 1 tablet (5 mg total) by mouth 2 (two) times daily. 60 tablet 0   Doxercalciferol (HECTOROL IV) Doxercalciferol (Hectorol)     DULoxetine (CYMBALTA) 60 MG capsule Take 1 capsule (60 mg total) by mouth daily. (Patient taking differently: Take 60 mg by mouth in the morning.) 30 capsule 0   furosemide (LASIX) 40 MG tablet Take 40 mg by mouth in the morning.     gabapentin (NEURONTIN) 100 MG capsule Take 1 capsule (100 mg total) by mouth at bedtime.  (Patient not taking: Reported on 10/12/2020) 30 capsule 0   heparin 1000 unit/mL SOLN injection Heparin Sodium (Porcine) 1,000 Units/mL Catheter Lock Arterial     HUMALOG KWIKPEN 100 UNIT/ML KwikPen Inject 4 Units into the skin 3 (three) times daily.     hydrALAZINE (APRESOLINE) 50 MG tablet Take 1 tablet (50 mg total) by mouth 3 (three) times daily. 90 tablet 0   HYDROcodone-acetaminophen (NORCO/VICODIN) 5-325 MG tablet Take 1 tablet by mouth every 4 (four) hours as needed for moderate pain. 15 tablet 0   Insulin Pen Needle (EASY TOUCH PEN NEEDLES) 31G X 8 MM MISC Use to inject insulin as instructed. 100 each 11   iron sucrose in sodium chloride 0.9 % 100 mL Iron Sucrose (Venofer)     isosorbide mononitrate (IMDUR) 30 MG 24 hr tablet Take 30 mg by mouth in the morning.     linagliptin (TRADJENTA) 5 MG TABS tablet Take 5 mg by mouth in the morning.     Methoxy PEG-Epoetin Beta (MIRCERA IJ) Mircera     metoprolol tartrate (LOPRESSOR) 25 MG tablet Take 25 mg by mouth in the morning and at bedtime.     pantoprazole (PROTONIX) 20 MG tablet Take 1 tablet (20 mg total) by mouth daily. (Patient taking differently: Take 20 mg by mouth in the  morning.) 30 tablet 0   pravastatin (PRAVACHOL) 40 MG tablet Take 1 tablet (40 mg total) by mouth every evening. (Patient taking differently: Take 40 mg by mouth in the morning.) 30 tablet 0   TOUJEO SOLOSTAR 300 UNIT/ML Solostar Pen Inject 6 Units into the skin at bedtime. (Patient taking differently: Inject 8 Units into the skin at bedtime.) 1.5 mL 0   No current facility-administered medications for this visit.     ROS:  See HPI  Physical Exam:  Today's Vitals   11/04/20 0835  BP: 128/63  Pulse: 88  Resp: 20  Temp: 98.4 F (36.9 C)  TempSrc: Temporal  SpO2: 98%  Weight: 193 lb 4.8 oz (87.7 kg)  Height: 5\' 2"  (1.575 m)   Body mass index is 35.36 kg/m.   Incision:  both incisions have healed nicely.  There is a spitting stitch at the distal portion  of proximal incision Extremities:   There is a palpable right radial pulse.   Motor and sensory are in tact.   There is a thrill/bruit present.  The fistula/graft is easily palpable Right hand and finger tips are warm      Assessment/Plan:  This is a 70 y.o. female who is s/p: right BC AVF on 08/12/2020 by Dr. Trula Slade and subsequently underwent revision of right [facility] AVF with superficialization and branch ligation as well as banding of proximal portion of fistula on 10/14/2020 by Dr. Trula Slade.  -the pt's steal sx have improved.  -will give the incisions a few more weeks to heal before using fistula.  Okay to start using on 12/06/2020.   -If pt has a tunneled dialysis catheter and the access has been used successfully to the satisfaction of the dialysis center, the tunneled catheter can be scheduled to be removed at their discretion.   -discussed with pt that access does not last forever and will need intervention or even new access at some point.  She expressed understanding -the pt will follow up as needed   Leontine Locket, [facility] [phone]  Clinic MD:  Oneida Alar

## 2020-11-04 ENCOUNTER — Ambulatory Visit (INDEPENDENT_AMBULATORY_CARE_PROVIDER_SITE_OTHER): Payer: Medicare Other | Admitting: Physician Assistant

## 2020-11-04 ENCOUNTER — Other Ambulatory Visit: Payer: Self-pay

## 2020-11-04 VITALS — BP 128/63 | HR 88 | Temp 98.4°F | Resp 20 | Ht 62.0 in | Wt 193.3 lb

## 2020-11-04 DIAGNOSIS — Z992 Dependence on renal dialysis: Secondary | ICD-10-CM

## 2020-11-04 DIAGNOSIS — N186 End stage renal disease: Secondary | ICD-10-CM

## 2020-11-17 ENCOUNTER — Encounter (HOSPITAL_COMMUNITY): Payer: Self-pay | Admitting: Emergency Medicine

## 2020-11-17 ENCOUNTER — Observation Stay (HOSPITAL_COMMUNITY)
Admission: EM | Admit: 2020-11-17 | Discharge: 2020-11-20 | Disposition: A | Discharge status: Home / Self Care | Payer: Medicare Other | Attending: Internal Medicine | Admitting: Internal Medicine

## 2020-11-17 ENCOUNTER — Other Ambulatory Visit: Payer: Self-pay

## 2020-11-17 ENCOUNTER — Emergency Department (HOSPITAL_COMMUNITY): Payer: Medicare Other

## 2020-11-17 DIAGNOSIS — I120 Hypertensive chronic kidney disease with stage 5 chronic kidney disease or end stage renal disease: Secondary | ICD-10-CM | POA: Diagnosis not present

## 2020-11-17 DIAGNOSIS — Z992 Dependence on renal dialysis: Secondary | ICD-10-CM | POA: Insufficient documentation

## 2020-11-17 DIAGNOSIS — N186 End stage renal disease: Secondary | ICD-10-CM | POA: Diagnosis not present

## 2020-11-17 DIAGNOSIS — Z79899 Other long term (current) drug therapy: Secondary | ICD-10-CM | POA: Diagnosis not present

## 2020-11-17 DIAGNOSIS — E11649 Type 2 diabetes mellitus with hypoglycemia without coma: Secondary | ICD-10-CM | POA: Diagnosis not present

## 2020-11-17 DIAGNOSIS — Z20822 Contact with and (suspected) exposure to covid-19: Secondary | ICD-10-CM | POA: Insufficient documentation

## 2020-11-17 DIAGNOSIS — F17220 Nicotine dependence, chewing tobacco, uncomplicated: Secondary | ICD-10-CM | POA: Insufficient documentation

## 2020-11-17 DIAGNOSIS — J45909 Unspecified asthma, uncomplicated: Secondary | ICD-10-CM | POA: Insufficient documentation

## 2020-11-17 DIAGNOSIS — Z96653 Presence of artificial knee joint, bilateral: Secondary | ICD-10-CM | POA: Diagnosis not present

## 2020-11-17 DIAGNOSIS — H8110 Benign paroxysmal vertigo, unspecified ear: Secondary | ICD-10-CM | POA: Insufficient documentation

## 2020-11-17 DIAGNOSIS — Z8616 Personal history of COVID-19: Secondary | ICD-10-CM | POA: Diagnosis not present

## 2020-11-17 DIAGNOSIS — Z7901 Long term (current) use of anticoagulants: Secondary | ICD-10-CM | POA: Insufficient documentation

## 2020-11-17 DIAGNOSIS — R55 Syncope and collapse: Principal | ICD-10-CM

## 2020-11-17 DIAGNOSIS — I25110 Atherosclerotic heart disease of native coronary artery with unstable angina pectoris: Secondary | ICD-10-CM | POA: Insufficient documentation

## 2020-11-17 DIAGNOSIS — G894 Chronic pain syndrome: Secondary | ICD-10-CM

## 2020-11-17 DIAGNOSIS — Z794 Long term (current) use of insulin: Secondary | ICD-10-CM | POA: Diagnosis not present

## 2020-11-17 DIAGNOSIS — E782 Mixed hyperlipidemia: Secondary | ICD-10-CM

## 2020-11-17 DIAGNOSIS — E1122 Type 2 diabetes mellitus with diabetic chronic kidney disease: Secondary | ICD-10-CM

## 2020-11-17 DIAGNOSIS — I951 Orthostatic hypotension: Secondary | ICD-10-CM | POA: Insufficient documentation

## 2020-11-17 DIAGNOSIS — E162 Hypoglycemia, unspecified: Secondary | ICD-10-CM | POA: Diagnosis present

## 2020-11-17 DIAGNOSIS — K219 Gastro-esophageal reflux disease without esophagitis: Secondary | ICD-10-CM

## 2020-11-17 HISTORY — DX: End stage renal disease: N18.6

## 2020-11-17 HISTORY — DX: End stage renal disease: Z99.2

## 2020-11-17 LAB — COMPREHENSIVE METABOLIC PANEL
ALT: 8 U/L (ref 0–44)
AST: 17 U/L (ref 15–41)
Albumin: 3.4 g/dL — ABNORMAL LOW (ref 3.5–5.0)
Alkaline Phosphatase: 65 U/L (ref 38–126)
Anion gap: 10 (ref 5–15)
BUN: 25 mg/dL — ABNORMAL HIGH (ref 8–23)
CO2: 23 mmol/L (ref 22–32)
Calcium: 9.6 mg/dL (ref 8.9–10.3)
Chloride: 107 mmol/L (ref 98–111)
Creatinine, Ser: 4.52 mg/dL — ABNORMAL HIGH (ref 0.44–1.00)
GFR, Estimated: 10 mL/min — ABNORMAL LOW (ref >60)
Glucose, Bld: 66 mg/dL — ABNORMAL LOW (ref 70–99)
Potassium: 3.8 mmol/L (ref 3.5–5.1)
Sodium: 140 mmol/L (ref 135–145)
Total Bilirubin: 0.4 mg/dL (ref 0.3–1.2)
Total Protein: 6.8 g/dL (ref 6.5–8.1)

## 2020-11-17 LAB — CBG MONITORING, ED
Glucose-Capillary: 79 mg/dL (ref 70–99)
Glucose-Capillary: 102 mg/dL — ABNORMAL HIGH (ref 70–99)
Glucose-Capillary: 188 mg/dL — ABNORMAL HIGH (ref 70–99)

## 2020-11-17 LAB — CBC WITH DIFFERENTIAL/PLATELET
Abs Immature Granulocytes: 0.03 10*3/uL (ref 0.00–0.07)
Basophils Absolute: 0 10*3/uL (ref 0.0–0.1)
Basophils Relative: 0 %
Eosinophils Absolute: 0.1 10*3/uL (ref 0.0–0.5)
Eosinophils Relative: 2 %
HCT: 36.2 % (ref 36.0–46.0)
Hemoglobin: 11.2 g/dL — ABNORMAL LOW (ref 12.0–15.0)
Immature Granulocytes: 0 %
Lymphocytes Relative: 28 %
Lymphs Abs: 2.1 10*3/uL (ref 0.7–4.0)
MCH: 28.9 pg (ref 26.0–34.0)
MCHC: 30.9 g/dL (ref 30.0–36.0)
MCV: 93.3 fL (ref 80.0–100.0)
Monocytes Absolute: 0.6 10*3/uL (ref 0.1–1.0)
Monocytes Relative: 7 %
Neutro Abs: 4.8 10*3/uL (ref 1.7–7.7)
Neutrophils Relative %: 63 %
Platelets: 201 10*3/uL (ref 150–400)
RBC: 3.88 MIL/uL (ref 3.87–5.11)
RDW: 14.2 % (ref 11.5–15.5)
WBC: 7.6 10*3/uL (ref 4.0–10.5)
nRBC: 0 % (ref 0.0–0.2)

## 2020-11-17 LAB — I-STAT CHEM 8, ED
BUN: 29 mg/dL — ABNORMAL HIGH (ref 8–23)
Calcium, Ion: 1.22 mmol/L (ref 1.15–1.40)
Chloride: 108 mmol/L (ref 98–111)
Creatinine, Ser: 4.8 mg/dL — ABNORMAL HIGH (ref 0.44–1.00)
Glucose, Bld: 68 mg/dL — ABNORMAL LOW (ref 70–99)
HCT: 34 % — ABNORMAL LOW (ref 36.0–46.0)
Hemoglobin: 11.6 g/dL — ABNORMAL LOW (ref 12.0–15.0)
Potassium: 4.1 mmol/L (ref 3.5–5.1)
Sodium: 142 mmol/L (ref 135–145)
TCO2: 25 mmol/L (ref 22–32)

## 2020-11-17 LAB — RESP PANEL BY RT-PCR (FLU A&B, COVID) ARPGX2
Influenza A by PCR: NEGATIVE
Influenza B by PCR: NEGATIVE
SARS Coronavirus 2 by RT PCR: NEGATIVE

## 2020-11-17 LAB — TROPONIN I (HIGH SENSITIVITY)
Troponin I (High Sensitivity): 21 ng/L — ABNORMAL HIGH (ref <18)
Troponin I (High Sensitivity): 23 ng/L — ABNORMAL HIGH (ref <18)

## 2020-11-17 LAB — GLUCOSE, CAPILLARY
Glucose-Capillary: 185 mg/dL — ABNORMAL HIGH (ref 70–99)
Glucose-Capillary: 172 mg/dL — ABNORMAL HIGH (ref 70–99)

## 2020-11-17 MED ORDER — ACETAMINOPHEN 325 MG PO TABS: 650.0000 mg | ORAL_TABLET | Freq: Once | ORAL | Status: DC

## 2020-11-17 MED ORDER — ALBUTEROL SULFATE (2.5 MG/3ML) 0.083% IN NEBU: 2.5000 mg | INHALATION_SOLUTION | RESPIRATORY_TRACT | Status: DC | PRN

## 2020-11-17 MED ORDER — ACETAMINOPHEN 325 MG PO TABS: 325.0000 mg | ORAL_TABLET | Freq: Once | ORAL | Status: DC

## 2020-11-17 MED ORDER — PANTOPRAZOLE SODIUM 20 MG PO TBEC
20.0000 mg | DELAYED_RELEASE_TABLET | Freq: Every morning | ORAL | Status: DC
  Administered 2020-11-18 – 2020-11-20 (×3): 20 mg via ORAL
  Filled 2020-11-17 (×4): qty 1

## 2020-11-17 MED ORDER — FUROSEMIDE 40 MG PO TABS
40.0000 mg | ORAL_TABLET | Freq: Every morning | ORAL | Status: DC
  Administered 2020-11-18: 40 mg via ORAL
  Filled 2020-11-17: qty 1

## 2020-11-17 MED ORDER — ACETAMINOPHEN 325 MG PO TABS
650.0000 mg | ORAL_TABLET | Freq: Four times a day (QID) | ORAL | Status: DC | PRN
  Administered 2020-11-18 – 2020-11-20 (×4): 650 mg via ORAL
  Filled 2020-11-17 (×4): qty 2

## 2020-11-17 MED ORDER — ACETAMINOPHEN 650 MG RE SUPP: 650.0000 mg | Freq: Four times a day (QID) | RECTAL | Status: DC | PRN

## 2020-11-17 MED ORDER — METOPROLOL TARTRATE 25 MG PO TABS
25.0000 mg | ORAL_TABLET | Freq: Two times a day (BID) | ORAL | Status: DC
  Administered 2020-11-17: 25 mg via ORAL
  Filled 2020-11-17: qty 1

## 2020-11-17 MED ORDER — ALBUTEROL SULFATE HFA 108 (90 BASE) MCG/ACT IN AERS: 2.0000 | INHALATION_SPRAY | RESPIRATORY_TRACT | Status: DC | PRN

## 2020-11-17 MED ORDER — PRAVASTATIN SODIUM 40 MG PO TABS
40.0000 mg | ORAL_TABLET | Freq: Every morning | ORAL | Status: DC
  Administered 2020-11-18 – 2020-11-20 (×3): 40 mg via ORAL
  Filled 2020-11-17 (×3): qty 1

## 2020-11-17 MED ORDER — INSULIN ASPART 100 UNIT/ML IJ SOLN
0.0000 [IU] | Freq: Three times a day (TID) | INTRAMUSCULAR | Status: DC
  Administered 2020-11-18: 3 [IU] via SUBCUTANEOUS
  Administered 2020-11-18: 2 [IU] via SUBCUTANEOUS
  Administered 2020-11-19: 5 [IU] via SUBCUTANEOUS
  Administered 2020-11-20: 2 [IU] via SUBCUTANEOUS

## 2020-11-17 MED ORDER — ISOSORBIDE MONONITRATE ER 30 MG PO TB24
30.0000 mg | ORAL_TABLET | Freq: Every morning | ORAL | Status: DC
  Administered 2020-11-18 – 2020-11-20 (×3): 30 mg via ORAL
  Filled 2020-11-17 (×3): qty 1

## 2020-11-17 MED ORDER — APIXABAN 5 MG PO TABS
5.0000 mg | ORAL_TABLET | Freq: Two times a day (BID) | ORAL | Status: DC
  Administered 2020-11-17 – 2020-11-20 (×6): 5 mg via ORAL
  Filled 2020-11-17 (×6): qty 1

## 2020-11-17 MED ORDER — ROPINIROLE HCL 0.25 MG PO TABS
0.2500 mg | ORAL_TABLET | Freq: Every day | ORAL | Status: DC
  Administered 2020-11-17 – 2020-11-19 (×3): 0.25 mg via ORAL
  Filled 2020-11-17 (×4): qty 1

## 2020-11-17 MED ORDER — AMLODIPINE BESYLATE 10 MG PO TABS
10.0000 mg | ORAL_TABLET | Freq: Every morning | ORAL | Status: DC
  Administered 2020-11-18 – 2020-11-20 (×3): 10 mg via ORAL
  Filled 2020-11-17 (×3): qty 1

## 2020-11-17 MED ORDER — DULOXETINE HCL 60 MG PO CPEP
60.0000 mg | ORAL_CAPSULE | Freq: Every day | ORAL | Status: DC
  Administered 2020-11-18 – 2020-11-20 (×3): 60 mg via ORAL
  Filled 2020-11-17 (×3): qty 1

## 2020-11-17 MED ORDER — CHLORHEXIDINE GLUCONATE CLOTH 2 % EX PADS
6.0000 | MEDICATED_PAD | Freq: Every day | CUTANEOUS | Status: DC
  Administered 2020-11-18 – 2020-11-20 (×3): 6 via TOPICAL

## 2020-11-17 MED ORDER — HYDRALAZINE HCL 50 MG PO TABS
50.0000 mg | ORAL_TABLET | Freq: Three times a day (TID) | ORAL | Status: DC
  Administered 2020-11-17 – 2020-11-20 (×9): 50 mg via ORAL
  Filled 2020-11-17 (×9): qty 1

## 2020-11-17 NOTE — ED Notes (Signed)
Ann [name] daughter [phone] would like an update

## 2020-11-17 NOTE — H&P (Addendum)
Date: 11/17/2020               Patient Name:  Ann [name] MRN: [id number]  DOB: 08/28/1950 Age / Sex: 70 y.o., female   PCP: Cipriano Mile, NP              Medical Service: Internal Medicine Teaching Service              Attending Physician: Dr. Sid Falcon, MD    First Contact: [place], [place] 3 Pager: [phone]  Second Contact: Dr. Mitzie Na Pager: [phone]  Third Contact Dr. Davina Poke Pager: [phone]       After Hours (After 5p/  First Contact Pager: [phone]  weekends / holidays): Second Contact Pager: [phone]   Chief Complaint: Pre-Syncopal Episode  History of Present Illness:   Khalidah Herbold Group is a 70 y/o woman with ESRD, CAD, Insulin dependent DM, anemia, GERD, and asthma who presents to care after having a pre-syncopal episode at home this AM.   Ms Hubers notes that she went to the bathroom early this AM and felt cold and sweaty. She called for her granddaughter who lives with her, sat down on a stool and felt that the room began to spin. She became progressively light headed, her granddaughter felt concerned and called EMS. She did not fall nor lose consciousness. She did not have any tinnitus.  Ms Zwiefelhofer notes that she has had episodes where she feels like the room is spinning around her for many years. These episodes are typically brought on by particular changes in her position, including rolling over in bed at night. She notes that her episode of dizziness this morning was very similar to these previous episodes.  Ms Gough also felt concerned that her blood sugar might be low because she felt cold and sweaty, which is a typical feeling for her when she has been hypoglycemic previously.   She also describes a new/different kind of chest pain in the last week. 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  Lastly, Ms Donson is on HD on MWF schedule. She skipped HD last Friday, this Monday and today (Wednesday). No nausea or vomiting. Didn't go to HD the last three sessions because she felt more frequent episodes of room-spinning.  Meds:  Current Meds  Medication Sig   acetaminophen (TYLENOL) 325 MG tablet Take 650 mg by mouth every 4 (four) hours as needed for moderate pain.   albuterol (VENTOLIN HFA) 108 (90 Base) MCG/ACT inhaler Inhale 2 puffs into the lungs every 4 (four) hours as needed for wheezing or shortness of breath.   amLODipine (NORVASC) 10 MG tablet Take 10 mg by mouth in the morning.   apixaban (ELIQUIS) 5 MG TABS tablet Take 1 tablet (5 mg total) by mouth 2 (two) times daily.   Doxercalciferol (HECTOROL IV) Doxercalciferol (Hectorol)   DULoxetine (CYMBALTA) 60 MG capsule Take 1 capsule (60 mg total) by mouth daily. (Patient taking differently: Take 60 mg by mouth in the morning.)   furosemide (LASIX) 40 MG tablet Take 40 mg  by mouth in the morning.   heparin 1000 unit/mL SOLN injection Heparin Sodium (Porcine) 1,000 Units/mL Catheter Lock Arterial   HUMALOG KWIKPEN 100 UNIT/ML KwikPen Inject 4 Units into the skin 3 (three) times daily.   hydrALAZINE (APRESOLINE) 50 MG tablet Take 1 tablet (50 mg total) by mouth 3 (three) times daily.   HYDROcodone-acetaminophen (NORCO/VICODIN) 5-325 MG tablet Take 1 tablet by mouth every 4 (four) hours as needed for moderate pain.   iron sucrose in sodium chloride 0.9 % 100 mL Iron Sucrose (Venofer)   isosorbide mononitrate (IMDUR) 30 MG 24 hr tablet Take 30 mg by mouth in the morning.   linagliptin (TRADJENTA) 5 MG TABS tablet Take 5 mg by mouth in the morning.   Methoxy  PEG-Epoetin Beta (MIRCERA IJ) Mircera   metoprolol tartrate (LOPRESSOR) 25 MG tablet Take 25 mg by mouth in the morning and at bedtime.   pantoprazole (PROTONIX) 20 MG tablet Take 1 tablet (20 mg total) by mouth daily. (Patient taking differently: Take 20 mg by mouth in the morning.)   pravastatin (PRAVACHOL) 40 MG tablet Take 1 tablet (40 mg total) by mouth every evening. (Patient taking differently: Take 40 mg by mouth in the morning.)   rOPINIRole (REQUIP) 0.25 MG tablet SMARTSIG:1 Tablet(s) By Mouth Every Evening   TOUJEO SOLOSTAR 300 UNIT/ML Solostar Pen Inject 6 Units into the skin at bedtime. (Patient taking differently: Inject 8 Units into the skin at bedtime.)     Allergies: Allergies as of 11/17/2020 - Review Complete 11/17/2020  Allergen Reaction Noted   Penicillins Anaphylaxis 11/17/2020   Codeine Nausea And Vomiting 12/27/2016   Metronidazole Itching 10/06/2008   Past Medical History:  Diagnosis Date   Anemia 09/29/2008   anemia of chronic disease   Anginal pain (HCC)    no recent chest pain   Arthritis    Asthma 09/29/2008   CAD (coronary artery disease) 09/29/2008   hx  PCI - [facility]. Stress test 10/2011 nl with EF 58%. [facility] card., last cath 2010   Chest pain 02/16/2009   CKD (chronic kidney disease), stage IV ([place]) 10/20/2009   GERD (gastroesophageal reflux disease)    Hyperlipidemia 10/21/2009   Hypertension 09/29/2008   Insulin dependent diabetes mellitus type IA ([place]) 09/29/2008   Lumbar back pain 02/16/2009   states pain goes down right leg   Myalgia 02/16/2009   Shortness of breath    with exerction   Shoulder pain, left 03/16/2009   occurs at night   Sleep apnea    no cpap    Family History:  Mom with DM, HTN, CAD, MI, CVA. Dad and brother both with MI  Social History:  Pt lives at home with her granddaughter (36 y/o). 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 Was formerly married. Daughter, [name] is healthcare POA.   Pt formerly consumed "several drinks" nearly every night for "around 20 years or so," but rarely consumes alcohol now.  Smoked only a few cigarettes in her teens and denies any illicit drug use. Completes all ADLs and IADLs. Ambulates with walker, cane or sometimes uses a wheelchair.  Review of Systems: A complete ROS was negative except as per HPI.  Review of Systems  Constitutional:  Positive for malaise/fatigue and weight loss (expected, effortful). Negative for chills and fever.  HENT:  Positive for congestion (chronically). Negative for hearing loss and tinnitus.   Respiratory:  Positive for cough (dry). Negative for hemoptysis, sputum production, shortness of breath and wheezing.  Cardiovascular:  Positive for chest pain. Negative for orthopnea and leg swelling.  Gastrointestinal:  Negative for abdominal pain, constipation, diarrhea and vomiting.  Genitourinary:  Negative for urgency.  Neurological:  Positive for dizziness. Negative for sensory change, speech change, focal weakness and loss of consciousness.    Physical Exam: Blood pressure (!) 154/61, pulse 68, temperature (!) 97.5 F (36.4 C), temperature source Oral, resp. rate 14, height 5\' 2"  (1.575 m), weight 95 kg, SpO2 100 %. General: Pleasant, well-appearing woman laying comfortably in bed. No acute distress. Head: Normocephalic. Atraumatic. CV: RRR. No murmurs, rubs, or gallops. No LE edema Pulmonary: Lungs CTAB. Normal effort. No wheezing or rales. Abdominal: Soft, nontender, nondistended. Normal bowel sounds. Extremities: Palpable radial and DP pulses. Normal ROM. Skin: Warm and dry. No obvious rash or lesions. Neuro: A&Ox3. Moves all extremities. Normal sensation. No focal deficit. Psych: Normal mood and affect  EKG: personally reviewed my interpretation is normal sinus rhythm with regular rate.  CXR: personally reviewed my interpretation is cardiomegaly without venous congestion. Clear lungs with low  volumes.  Assessment & Plan by Problem: Principal Problem:   Syncope, near Active Problems:   Hypoglycemia  Pre-syncopal episode Episode consistent with prior vertigo and hypoglycemic episodes. Pt presented to ED with glucose at 66. Eating well without changes in recent insulin administration. EKG benign without bradycardia or bundle branch blocks. Neuro exam reassuring. Electrolytes normal. -Checking orthostatics -CBC and CMP  Unstable angina Different pattern of chest pain episodes x1 week with substernal pressure at rest and with activity. EKG without ST changes. Stable serial troponins. Hemodynamically stable. Currently on Eliquis for December DVT and would not want to add on DAPT given her ambulatory status. Pt without any recurrent symptoms since being in the ED. Will continue to monitor with telemetry and symptom report with low threshold for PCI referral.  -Add 325mg  ASA as inpatient -Continue home 40mg  Pravastatin -Continue home 25mg  Metoprolol tartrate  BID -Continue home 30mg  Imdur daily and 50mg  Hydralazine TID -Continue home 5mg  Eliquis BID  Urinary incontinence New episode of incontinence with recent urgency. No fevers or other urinary symptoms. Allergie to metronidazole and penicillins. -UA and will add culture   ESRD Pt missed last three sessions of HD. BUN not impressive at 25. Potassium normal at 3.8 and Creatinine at 4.52 -Consulted Nephro for HD management to begin tomorrow AM .    Dispo: Admit patient to Observation with expected length of stay less than 2 midnights.  Signed: London Pepper, Medical Student 11/17/2020, 4:27 PM  Pager: [phone]    Attestation for Student Documentation:  I personally was present and performed or re-performed the history, physical exam and medical decision-making activities of this service and have verified that the service and findings are accurately documented in the student's note.  Schneur Crowson N, DO 11/17/2020, 9:30  PM

## 2020-11-17 NOTE — ED Provider Notes (Signed)
[facility] EMERGENCY DEPARTMENT Provider Note  CSN: [id number] Arrival date & time: 11/17/20 0534  Chief Complaint(s) Missed Dialysis x3 / Syncope  HPI Ann [name] is a 70 y.o. female with a past medical history listed below who presents to the emergency department after being found unresponsive by family in the bathroom.  Patient reports that she was voiding and she felt lightheaded and clammy.  She denied any associated shortness of breath or chest pain.  She denied recent fevers or infections.  No cough or congestion.  No nausea or vomiting.  No diarrhea.  She did report that she has been having lightheadedness over the past 2 weeks.  Reports that is because of this that she did not go to dialysis since last Wednesday.  HPI  Past Medical History Past Medical History:  Diagnosis Date   Anemia 09/29/2008   anemia of chronic disease   Anginal pain (HCC)    no recent chest pain   Arthritis    Asthma 09/29/2008   CAD (coronary artery disease) 09/29/2008   hx  PCI - [facility]. Stress test 10/2011 nl with EF 58%. [place] card., last cath 2010   Chest pain 02/16/2009   CKD (chronic kidney disease), stage IV ([place]) 10/20/2009   GERD (gastroesophageal reflux disease)    Hyperlipidemia 10/21/2009   Hypertension 09/29/2008   Insulin dependent diabetes mellitus type IA ([place]) 09/29/2008   Lumbar back pain 02/16/2009   states pain goes down right leg   Myalgia 02/16/2009   Shortness of breath    with exerction   Shoulder pain, left 03/16/2009   occurs at night   Sleep apnea    no cpap   Patient Active Problem List   Diagnosis Date Noted   Dependence on renal dialysis () 06/22/2020   Unspecified protein-calorie malnutrition ([place]) 05/05/2020   Decubitus ulcer of buttock 04/22/2020   Allergy, unspecified, initial encounter 04/22/2020   Anaphylactic shock, unspecified, initial encounter 04/22/2020   Anemia in chronic kidney disease 04/22/2020    Coagulation defect, unspecified ([place]) 60/63/0160   Complication of vascular dialysis catheter 04/22/2020   End stage renal disease ([place]) 04/22/2020   Iron deficiency anemia, unspecified 04/22/2020   Obstructive sleep apnea 04/22/2020   Other asthma 04/22/2020   Pruritus, unspecified 04/22/2020   Secondary hyperparathyroidism of renal origin ([place]) 04/22/2020   Shortness of breath 04/22/2020   Underimmunization status 04/22/2020   Acute respiratory failure with hypoxia ([place]) 04/11/2020   AV node arrhythmia 04/04/2020   AKI (acute kidney injury) ([place]) 04/04/2020   Acute renal failure ([place]) 04/04/2020   2019 novel coronavirus disease (COVID-19) 04/03/2020   GERD (gastroesophageal reflux disease)    Constipation 02/25/2019   Pyelonephritis 02/20/2019   Acute encephalopathy 02/20/2019   Chronic pain 02/20/2019   Obesity, Class III, BMI 40-49.9 (morbid obesity) ([place]) 01/28/2013   Uncontrolled type II diabetes mellitus with hyperglycemia ([place]) 03/25/2012   Health care maintenance 03/25/2012   Acute blood loss anemia 12/01/2011    Class: Acute   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    Class: Chronic   Plantar fasciitis of right foot 09/28/2011   Breast pain 03/08/2011   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   Knee pain, right 01/20/2011   Thoracic spine pain 12/13/2010   Hyperlipidemia 10/21/2009   CKD (chronic kidney disease), stage IV ([place]) 10/20/2009   UNSPECIFIED ANEMIA 09/29/2008   HYPERTENSION 09/29/2008   CAD (coronary artery disease) 09/29/2008   ASTHMA, UNSPECIFIED, UNSPECIFIED STATUS 09/29/2008  Home Medication(s) Prior to Admission medications   Medication Sig Start Date End Date Taking? Authorizing Provider  ACCU-CHEK FASTCLIX LANCETS MISC USE AS DIRECTED TO CHECK BLOOD SUGAR FOUR TIMES A DAY(BEFORE MEALS AND AT BEDTIME). 10/14/12   Sid Falcon, MD  ACCU-CHEK SMARTVIEW test strip CHECK BLOOD SUGAR BEFORE MEALS AND AT BEDTIME 10/14/12   Sid Falcon, MD   acetaminophen (TYLENOL) 325 MG tablet Take 650 mg by mouth every 4 (four) hours as needed for moderate pain.    [provider]  albuterol (VENTOLIN HFA) 108 (90 Base) MCG/ACT inhaler Inhale 2 puffs into the lungs every 4 (four) hours as needed for wheezing or shortness of breath. 05/04/20   Medina-Vargas, Monina C, NP  amLODipine (NORVASC) 10 MG tablet Take 10 mg by mouth in the morning.    [provider]  apixaban (ELIQUIS) 5 MG TABS tablet Take 1 tablet (5 mg total) by mouth 2 (two) times daily. 05/04/20   Medina-Vargas, Monina C, NP  Doxercalciferol (HECTOROL IV) Doxercalciferol (Hectorol) 05/03/20 05/02/21  [provider]  DULoxetine (CYMBALTA) 60 MG capsule Take 1 capsule (60 mg total) by mouth daily. Patient taking differently: Take 60 mg by mouth in the morning. 05/04/20   Medina-Vargas, Monina C, NP  furosemide (LASIX) 40 MG tablet Take 40 mg by mouth in the morning.    [provider]  heparin 1000 unit/mL SOLN injection Heparin Sodium (Porcine) 1,000 Units/mL Catheter Lock Arterial 04/23/20 04/22/21  [provider]  HUMALOG KWIKPEN 100 UNIT/ML KwikPen Inject 4 Units into the skin 3 (three) times daily. 06/24/20   [provider]  hydrALAZINE (APRESOLINE) 50 MG tablet Take 1 tablet (50 mg total) by mouth 3 (three) times daily. 05/04/20   Medina-Vargas, Monina C, NP  HYDROcodone-acetaminophen (NORCO/VICODIN) 5-325 MG tablet Take 1 tablet by mouth every 4 (four) hours as needed for moderate pain. 10/14/20 10/14/21  Setzer, Edman Circle, PA-C  Insulin Pen Needle (EASY TOUCH PEN NEEDLES) 31G X 8 MM MISC Use to inject insulin as instructed. 02/02/12   Bartholomew Crews, MD  iron sucrose in sodium chloride 0.9 % 100 mL Iron Sucrose (Venofer) 09/01/20 08/24/21  [provider]  isosorbide mononitrate (IMDUR) 30 MG 24 hr tablet Take 30 mg by mouth in the morning.    [provider]  linagliptin (TRADJENTA) 5 MG TABS tablet Take 5 mg by  mouth in the morning.    [provider]  Methoxy PEG-Epoetin Beta (MIRCERA IJ) Mircera 07/12/20 09/05/21  [provider]  metoprolol tartrate (LOPRESSOR) 25 MG tablet Take 25 mg by mouth in the morning and at bedtime.    [provider]  pantoprazole (PROTONIX) 20 MG tablet Take 1 tablet (20 mg total) by mouth daily. Patient taking differently: Take 20 mg by mouth in the morning. 05/04/20   Medina-Vargas, Monina C, NP  pravastatin (PRAVACHOL) 40 MG tablet Take 1 tablet (40 mg total) by mouth every evening. Patient taking differently: Take 40 mg by mouth in the morning. 05/04/20   Medina-Vargas, Monina C, NP  rOPINIRole (REQUIP) 0.25 MG tablet SMARTSIG:1 Tablet(s) By Mouth Every Evening 10/21/20   [provider]  TOUJEO SOLOSTAR 300 UNIT/ML Solostar Pen Inject 6 Units into the skin at bedtime. Patient taking differently: Inject 8 Units into the skin at bedtime. 05/04/20   Medina-Vargas, Senaida Lange, NP  Past Surgical History Past Surgical History:  Procedure Laterality Date   ABDOMINAL HYSTERECTOMY     APPENDECTOMY     AV FISTULA PLACEMENT Right 08/12/2020   Procedure: BRACHIOCEPHALIC ARTERIOVENOUS (AV) FISTULA CREATION RIGHT;  Surgeon: Serafina Mitchell, MD;  Location: [facility];  Service: Vascular;  Laterality: Right;   [place] Right 10/14/2020   Procedure: RIGHT ARM SECOND STAGE BRACHIOCEPHALIC FISTULA TRANSPOSITION;  Surgeon: Serafina Mitchell, MD;  Location: [facility];  Service: Vascular;  Laterality: Right;   CARDIAC CATHETERIZATION  2008   in [place], [place] stable   CARDIAC CATHETERIZATION  2010   patent stents in mid and distal RCA; high grade disease in prox & mid AV groove LCX& OM; mod disease in prox & mid LAD normal LV FUNCTION--TREATED MEDICALLY     CATARACT EXTRACTION, BILATERAL     CHOLECYSTECTOMY     CORONARY  ANGIOPLASTY WITH STENT PLACEMENT  2005   2 vessel PCI at [place].   DOPPLER ECHOCARDIOGRAPHY  1998   EF 60%   IR FLUORO GUIDE CV LINE LEFT  04/21/2020   IR US GUIDE VASC ACCESS LEFT  04/21/2020   TOTAL KNEE ARTHROPLASTY  12/2011   Left   TOTAL KNEE ARTHROPLASTY  11/28/2011   Procedure: TOTAL KNEE ARTHROPLASTY;  Surgeon: Hessie Dibble, MD;  Location: [facility];  Service: Orthopedics;  Laterality: Right;   Family History Family History  Problem Relation Age of Onset   Heart attack Father    Heart disease Sister    Heart disease Brother    Diabetes Mother    Hypertension Mother    Heart disease Mother    Heart attack Mother    Stroke Mother    Colon cancer Neg Hx     Social History Social History   Tobacco Use   Smoking status: Never   Smokeless tobacco: Current    Types: Snuff   Tobacco comments:    using "all my life"  Vaping Use   Vaping Use: Never used  Substance Use Topics   Alcohol use: No    Alcohol/week: 0.0 standard drinks   Drug use: No   Allergies Codeine and Metronidazole  Review of Systems Review of Systems All other systems are reviewed and are negative for acute change except as noted in the HPI  Physical Exam Vital Signs  I have reviewed the triage vital signs BP (!) 158/66 (BP Location: Left Arm)   Pulse 61   Temp (!) 97.5 F (36.4 C) (Oral)   Resp 20   Ht 5\' 2"  (1.575 m)   Wt 95 kg   SpO2 99%   BMI 38.31 kg/m   Physical Exam Vitals reviewed.  Constitutional:      General: She is not in acute distress.    Appearance: She is well-developed. She is not diaphoretic.  HENT:     Head: Normocephalic and atraumatic.     Nose: Nose normal.  Eyes:     General: No scleral icterus.       Right eye: No discharge.        Left eye: No discharge.     Conjunctiva/sclera: Conjunctivae normal.     Pupils: Pupils are equal, round, and reactive to light.  Cardiovascular:     Rate and Rhythm: Normal rate and regular rhythm.     Heart sounds: No  murmur heard.   No friction rub. No gallop.  Pulmonary:     Effort: Pulmonary effort is normal. No respiratory distress.  Breath sounds: Normal breath sounds. No stridor. No rales.  Chest:    Abdominal:     General: There is no distension.     Palpations: Abdomen is soft.     Tenderness: There is no abdominal tenderness.  Musculoskeletal:        General: No tenderness.     Cervical back: Normal range of motion and neck supple.  Skin:    General: Skin is warm and dry.     Findings: No erythema or rash.  Neurological:     Mental Status: She is alert and oriented to person, place, and time.    ED Results and Treatments Labs (all labs ordered are listed, but only abnormal results are displayed) Labs Reviewed  CBC WITH DIFFERENTIAL/PLATELET - Abnormal; Notable for the following components:      Result Value   Hemoglobin 11.2 (*)    All other components within normal limits  COMPREHENSIVE METABOLIC PANEL - Abnormal; Notable for the following components:   Glucose, Bld 66 (*)    BUN 25 (*)    Creatinine, Ser 4.52 (*)    Albumin 3.4 (*)    GFR, Estimated 10 (*)    All other components within normal limits  I-STAT CHEM 8, ED - Abnormal; Notable for the following components:   BUN 29 (*)    Creatinine, Ser 4.80 (*)    Glucose, Bld 68 (*)    Hemoglobin 11.6 (*)    HCT 34.0 (*)    All other components within normal limits  TROPONIN I (HIGH SENSITIVITY) - Abnormal; Notable for the following components:   Troponin I (High Sensitivity) 21 (*)    All other components within normal limits  URINALYSIS, ROUTINE W REFLEX MICROSCOPIC  CBG MONITORING, ED  TROPONIN I (HIGH SENSITIVITY)                                                                                                                         EKG  EKG Interpretation  Date/Time:  Wednesday November 17 2020 05:45:42 EDT Ventricular Rate:  64 PR Interval:  159 QRS Duration: 107 QT Interval:  475 QTC Calculation: 491 R  Axis:   23 Text Interpretation: Sinus rhythm Borderline prolonged QT interval Confirmed by Addison Lank [phone]) on 11/17/2020 6:03:30 AM       Radiology DG Chest Port 1 View  Result Date: 11/17/2020 CLINICAL DATA:  Syncope. EXAM: PORTABLE CHEST 1 VIEW COMPARISON:  04/11/2020. FINDINGS: Interim removal of right IJ line and placement of a left dual-lumen catheter, tip over SVC. Cardiomegaly. No pulmonary venous congestion. Low lung volumes with mild bibasilar atelectasis. No focal infiltrate. No pleural effusion or pneumothorax. IMPRESSION: 1.  Dual-lumen catheter noted with tip over SVC. 2.  Cardiomegaly.  No pulmonary venous congestion. 3.  Low lung volumes with mild bibasilar atelectasis. Electronically Signed   By: Marcello Moores  Register   On: 11/17/2020 06:54    Pertinent labs & imaging results that were available during my care of  the patient were reviewed by me and considered in my medical decision making (see chart for details).  Medications Ordered in ED Medications - No data to display                                                                                                                                  Procedures Procedures  (including critical care time)  Medical Decision Making / ED Course I have reviewed the nursing notes for this encounter and the patient's prior records (if available in EHR or on provided paperwork).   Jazmene Racz Heick was evaluated in Emergency Department on 11/17/2020 for the symptoms described in the history of present illness. She was evaluated in the context of the global COVID-19 pandemic, which necessitated consideration that the patient might be at risk for infection with the SARS-CoV-2 virus that causes COVID-19. Institutional protocols and algorithms that pertain to the evaluation of patients at risk for COVID-19 are in a state of rapid change based on information released by regulatory bodies including the CDC and federal and state organizations.  These policies and algorithms were followed during the patient's care in the ED.  EKG without acute ischemic changes, dysrhythmias, blocks. Initial labs notable for hyperglycemia. Able to take oral intake. Will attempt to treat orally and repeat frequent CBGs.  Labs without hyperkalemia.  No evidence of pulmonary edema on exam and patient is satting well on room air.  No evidence of need for emergent dialysis.  Pending trops and repeat CBGs  Patient care turned over to Dr Almyra Free. Patient case and results discussed in detail; please see their note for further ED managment.        Final Clinical Impression(s) / ED Diagnoses Final diagnoses:  None      This chart was dictated using voice recognition software.  Despite best efforts to proofread,  errors can occur which can change the documentation meaning.    Fatima Blank, MD 11/17/20 [id number]

## 2020-11-17 NOTE — ED Provider Notes (Signed)
Troponin sent both are unremarkable. Patient appears comfortable at this time.  Given her advanced age history and syncopal episode, will be brought into the hospitalist team for further evaluation.   Luna Fuse, MD 11/17/20 1350

## 2020-11-17 NOTE — ED Notes (Signed)
Unable to access peripheral IV despite multiple venipunctures , IV team consult ordered.

## 2020-11-17 NOTE — ED Notes (Signed)
Pt assisted to restroom with wheelchair. Provided urine cup to obtain sample. Pt verbalized understanding.

## 2020-11-17 NOTE — ED Notes (Signed)
Pt given OJ and graham crackers. Pt sitting up in bed. A/OX4

## 2020-11-17 NOTE — ED Notes (Signed)
Assisted pt to restroom via wheelchair. Pt had one Bm unable to provide urine sample.

## 2020-11-17 NOTE — ED Notes (Signed)
IV team nurse at bedside. 

## 2020-11-17 NOTE — ED Triage Notes (Signed)
Patient arrived with EMS from a motel  found unresponsive by family inside the bathroom this morning , she missed 3 hemodialysis treatment , respirations unlabored , reports generalized weakness/fatigue with mild dizziness.

## 2020-11-18 ENCOUNTER — Encounter (HOSPITAL_COMMUNITY): Payer: Self-pay | Admitting: Internal Medicine

## 2020-11-18 DIAGNOSIS — R55 Syncope and collapse: Secondary | ICD-10-CM

## 2020-11-18 DIAGNOSIS — E162 Hypoglycemia, unspecified: Secondary | ICD-10-CM

## 2020-11-18 LAB — BASIC METABOLIC PANEL
Anion gap: 8 (ref 5–15)
BUN: 27 mg/dL — ABNORMAL HIGH (ref 8–23)
CO2: 23 mmol/L (ref 22–32)
Calcium: 9.2 mg/dL (ref 8.9–10.3)
Chloride: 104 mmol/L (ref 98–111)
Creatinine, Ser: 4.67 mg/dL — ABNORMAL HIGH (ref 0.44–1.00)
GFR, Estimated: 9 mL/min — ABNORMAL LOW (ref >60)
Glucose, Bld: 161 mg/dL — ABNORMAL HIGH (ref 70–99)
Potassium: 3.9 mmol/L (ref 3.5–5.1)
Sodium: 135 mmol/L (ref 135–145)

## 2020-11-18 LAB — URINALYSIS, ROUTINE W REFLEX MICROSCOPIC
Bilirubin Urine: NEGATIVE
Glucose, UA: NEGATIVE mg/dL
Hgb urine dipstick: NEGATIVE
Ketones, ur: NEGATIVE mg/dL
Nitrite: NEGATIVE
Protein, ur: 100 mg/dL — AB
Specific Gravity, Urine: 1.011 (ref 1.005–1.030)
WBC, UA: >50 WBC/hpf — ABNORMAL HIGH (ref 0–5)
pH: 5 (ref 5.0–8.0)

## 2020-11-18 LAB — HEPATITIS B SURFACE ANTIGEN: Hepatitis B Surface Ag: NONREACTIVE

## 2020-11-18 LAB — CBC
HCT: 33 % — ABNORMAL LOW (ref 36.0–46.0)
Hemoglobin: 10.7 g/dL — ABNORMAL LOW (ref 12.0–15.0)
MCH: 29.6 pg (ref 26.0–34.0)
MCHC: 32.4 g/dL (ref 30.0–36.0)
MCV: 91.4 fL (ref 80.0–100.0)
Platelets: 178 10*3/uL (ref 150–400)
RBC: 3.61 MIL/uL — ABNORMAL LOW (ref 3.87–5.11)
RDW: 14.3 % (ref 11.5–15.5)
WBC: 7 10*3/uL (ref 4.0–10.5)
nRBC: 0 % (ref 0.0–0.2)

## 2020-11-18 LAB — HEPATITIS B CORE ANTIBODY, TOTAL: Hep B Core Total Ab: NONREACTIVE

## 2020-11-18 LAB — HEMOGLOBIN A1C
Hgb A1c MFr Bld: 5.8 % — ABNORMAL HIGH (ref 4.8–5.6)
Mean Plasma Glucose: 120 mg/dL

## 2020-11-18 LAB — GLUCOSE, CAPILLARY
Glucose-Capillary: 133 mg/dL — ABNORMAL HIGH (ref 70–99)
Glucose-Capillary: 118 mg/dL — ABNORMAL HIGH (ref 70–99)
Glucose-Capillary: 153 mg/dL — ABNORMAL HIGH (ref 70–99)
Glucose-Capillary: 112 mg/dL — ABNORMAL HIGH (ref 70–99)

## 2020-11-18 MED ORDER — DOXERCALCIFEROL 4 MCG/2ML IV SOLN: 2.0000 ug | Freq: Once | INTRAVENOUS | Status: AC

## 2020-11-18 MED ORDER — METOPROLOL TARTRATE 25 MG PO TABS: 25.0000 mg | ORAL_TABLET | Freq: Every day | ORAL | Status: DC

## 2020-11-18 MED ORDER — METOPROLOL TARTRATE 12.5 MG HALF TABLET
12.5000 mg | ORAL_TABLET | Freq: Two times a day (BID) | ORAL | Status: DC
  Administered 2020-11-18 – 2020-11-20 (×4): 12.5 mg via ORAL
  Filled 2020-11-18 (×4): qty 1

## 2020-11-18 MED ORDER — DOXERCALCIFEROL 4 MCG/2ML IV SOLN
INTRAVENOUS | Status: AC
  Administered 2020-11-18: 2 ug via INTRAVENOUS
  Filled 2020-11-18: qty 2

## 2020-11-18 MED ORDER — HEPARIN SODIUM (PORCINE) 1000 UNIT/ML DIALYSIS: 2000.0000 [IU] | INTRAMUSCULAR | Status: DC | PRN

## 2020-11-18 MED ORDER — LACTATED RINGERS IV BOLUS
500.0000 mL | Freq: Once | INTRAVENOUS | Status: AC
  Administered 2020-11-18: 500 mL via INTRAVENOUS

## 2020-11-18 NOTE — Progress Notes (Addendum)
  Date: 11/18/2020  Patient name: Selma record number: [id number]  Date of birth: November 14, 1950   I have seen and evaluated [place] and discussed their care with the Residency Team. Briefly, Ms. Otte is a 70 year old woman with PMH of ESRD, CAD, IDDM, anemia, GERD who presented after a pre-syncopal episodes, missed HD and chest pain.  The patient initially reported an increase in vertiginous symptoms which led to the syncope.  She was also noted to be orthostatic.  However, this morning, she noted not having vertigo "for a long time."  Family was contacted by the resident team and it was noted that Ms. Spirito's memory has been worsening lately.  She did note worsening of her chronic angina and there was concern for unstable angina, however, her Troponin and EKG were unremarkable.  She has no complaints of chest pain when we saw her in HD this morning.   PMHx, Fam Hx, and/or Soc Hx : FH of DM, HTN, MI.  She lives with granddaughter.  She completes her own ADLs and iADLS at home.  Currently not using tobacco products.   Vitals:   11/18/20 1144 11/18/20 1240  BP: 138/75 121/61  Pulse: 87 83  Resp:  14  Temp: 98 F (36.7 C) 98.7 F (37.1 C)  SpO2:  98%   Gen: sleepy, lying in dialysis Eyes: Anicteric sclerae, no injection CV: RR, NR, No murmur Chest; Left sided dialysis catheter connected to HD machine Pulm: Breathing comfortably, no wheezing Abd: Soft, NT, ND MSK: Normal tone and bulk, no edema Psych: Pleasant, falls asleep between questions.   EKG: HR 63, borderline prolonged QTc, no ST changes  UA: Contaminated, not convincing for infection.  She does still make urine, some urgency over last month.   Assessment and Plan: I have seen and evaluated the patient as outlined above. I agree with the formulated Assessment and Plan as detailed in the residents' note, with the following changes:   1. Pre-syncope in the setting of missed HD, lower heart rate,  orthostasis - Would plan to stop lasix, decrease metoprolol by half - Monitor on telemetry - Repeat troponin if complaining of chest pain - Nephrology consulted for HD - done today - UA not convincing for an infection - Follow up culture - Treat hypoglycemia, would get good CBGs as Hgb A1C can be unreliable in ESRD.  If truly having low sugars, would decrease insulin dosage - Repeat orthostatic vitals now normal.  - PT/OT evaluation  2. Angina, ? Unstable - Chest pain improved, unclear if this was related to worsening angina or not - Telemetry  - Monitor for symptoms  Other issues per Med Student [facility] note.   Sid Falcon, MD 7/28/20224:00 PM

## 2020-11-18 NOTE — Progress Notes (Signed)
Subjective:  Ann [name] is a 70 y/o female with ESRD on HD, CAD s/p stents in RCA, HTN, IDDM ad OSA admitted for management and evaluation of a presyncopal episode.  Ann [name] notes that she is feeling well but tired today and has no new concerns.   Upon asking if she has had any more vertigo symptoms she replied, "I haven't had one of those in a while." Prompted with reminder that she had an episode of this yesterday morning that brought her to the ED, to which she replied "oh, well, I guess you might be right." Pt can't recall the name of the motel that she lives in, nor able to say how long she has lived there. Notes she has had concerns about her memory for "some time," but notes she feels safe at home (granddaughter livers with her).   She takes 8units Solostar nightly. She also takes 3 units short acting before meals. Checks Freestyle [place] 3-4 times a day and is not sure what her sugars normally run.  With further questioning about her recent urinary habits, she denies dysuria and notes that she has been urinating more frequently. She has continued urinating since beginning HD in December. She denies any changes in her urine color. Endorses bladder urgency without making it to the toilet in time on a couple occasions.   Objective:  Vital signs in last 24 hours: Vitals:   11/18/20 1000 11/18/20 1030 11/18/20 1059 11/18/20 1240  BP: 114/60 123/63 (!) 100/58 121/61  Pulse: 87 94 89 83  Resp: 17   14  Temp:    98.7 F (37.1 C)  TempSrc:      SpO2:    98%  Weight:      Height:       Weight change:  No intake or output data in the 24 hours ending 11/18/20 1415  Orthostatic BP: Lying 168/64. Sitting 140/78. Standing 114/69.  General: Pleasant, well-appearing woman laying comfortably in bed. No acute distress. Head: Normocephalic. Atraumatic. CV: RRR. No murmurs, rubs, or gallops. No LE edema Pulmonary: Lungs CTAB. Normal effort. No wheezing or rales. Abdominal: Soft,  nontender, nondistended. Normal bowel sounds. Extremities: Palpable radial and DP pulses. Normal ROM. Skin: Warm and dry. No obvious rash or lesions. Neuro: A&Ox3. Moves all extremities. Normal sensation. No focal deficit. Psych: Normal mood and affect    Assessment/Plan:  Principal Problem:   Syncope, near Active Problems:   Hypoglycemia  Hypoglycemic episode Pt presented initially with Glucose at 66 and symptoms consistent with prior hypoglycemic episodes. Takes 8u Solostar at night, Humalog TID and 5mg  Linagliptin. A1C at 5.8 but has been on HD so likely under-estimated. -Decrease nightly insulin to 4units -Encouraged recording twice daily sugar checks to take to PCP as outpt   Orthostatic hypotension Systolic dropped from 409/81 to 114/69 in the setting of 3 missed HD sessions but appeared euvolemic on exam. Initially considered decreasing Beta blocker due to HR of 60 on presentation but HR during orthostatics appropriately increased from 77 to 96, and HR has been between 74-89 since admission. Home meds include 40mg  Lasix, 25mg  Metoprolol tartrate BID, 30mg  Isosorbide mononitrate and 50mg  Hydralazine TID.  -Gave 533mL LR bolus -Decreased to 12.5mg  Metoprolol tartrate BID -Will recheck orthostatics later today  Unstable angina Different pattern of chest pain episodes x1 week with substernal pressure at rest and with activity. EKG and serial troponins stable. On Eliquis for December DVT. No recurrent symptoms during admission.  -325mg  ASA -40mg  Pravastatin -  Meorprolol as above -30mg  Imdur -50mg  Hydralazine TID  Benign Paroxysmal Positional Vertigo Chronic episodic sensations of room-spinning provoked by posture changes such as rolling over in bed. No tinnitus or headaches. -Referral to outpt vestibulocochlear rehab  Memory loss with unspecified chronicity Pt has had difficulty drawing forth information and notes this has been the case for a while. No known hx of strokes,  though does have risk factors. Could have ischemic dementia at baseline. No acute changes since admission.  -Will reach out to daughter and granddaughter for baseline hx -Will need outpt evaluation  ESRD -HD per Nephro  Prior to admission living arrangement: lives at motel with granddaughter Anticipated discharge location: back to motel with granddaughter Barriers to discharge: need eval by PT/OT for discharge rec Dispo: 1 day    LOS: 0 days   London Pepper, Medical Student 11/18/2020, 2:15 PM

## 2020-11-18 NOTE — Consult Note (Signed)
Renal Service Consult Note [facility]  Ann [name] 11/18/2020 Ann Blazing, MD Requesting Physician: Dr. Lester [name]  Reason for Consult: ESRD pt w/ vertigo HPI: The patient is a 70 y.o. year-old w/ hx of HTN, CAD sp PCI, ESRD on HD, IDDM, OSA presented to w/ c/o pre-syncopal episose and 1 week hx of vertigo type symptoms which caused her to miss HD x 2.  In ED eval showed normal EKG, normal neuro exam primary team. Labs okay for esrd pt, creat 4. CXR w/o edema. Asked to see for dialysis.   Pt w/o c/o at this time, seen in HD unit. No CP, cough or abd pain  ROS - denies CP, no joint pain, no HA, no blurry vision, no rash, no diarrhea, no nausea/ vomiting, no dysuria, no difficulty voiding   Past Medical History  Past Medical History:  Diagnosis Date   Anemia 09/29/2008   anemia of chronic disease   Anginal pain (HCC)    no recent chest pain   Arthritis    Asthma 09/29/2008   CAD (coronary artery disease) 09/29/2008   hx  PCI - [facility]. Stress test 10/2011 nl with EF 58%. [place] card., last cath 2010   Chest pain 02/16/2009   ESRD on hemodialysis [facility])    MWF started HD in early 2022   GERD (gastroesophageal reflux disease)    Hyperlipidemia 10/21/2009   Hypertension 09/29/2008   Insulin dependent diabetes mellitus type IA ([place]) 09/29/2008   Lumbar back pain 02/16/2009   states pain goes down right leg   Myalgia 02/16/2009   Shortness of breath    with exerction   Shoulder pain, left 03/16/2009   occurs at night   Sleep apnea    no cpap   Past Surgical History  Past Surgical History:  Procedure Laterality Date   ABDOMINAL HYSTERECTOMY     APPENDECTOMY     AV FISTULA PLACEMENT Right 08/12/2020   Procedure: BRACHIOCEPHALIC ARTERIOVENOUS (AV) FISTULA CREATION RIGHT;  Surgeon: Ann Mitchell, MD;  Location: [place];  Service: Vascular;  Laterality: Right;   [place] Right 10/14/2020   Procedure: RIGHT ARM SECOND STAGE  BRACHIOCEPHALIC FISTULA TRANSPOSITION;  Surgeon: Ann Mitchell, MD;  Location: [place];  Service: Vascular;  Laterality: Right;   CARDIAC CATHETERIZATION  2008   in [place], [place] stable   CARDIAC CATHETERIZATION  2010   patent stents in mid and distal RCA; high grade disease in prox & mid AV groove LCX& OM; mod disease in prox & mid LAD normal LV FUNCTION--TREATED MEDICALLY     CATARACT EXTRACTION, BILATERAL     CHOLECYSTECTOMY     CORONARY ANGIOPLASTY WITH STENT PLACEMENT  2005   2 vessel PCI at [place].   DOPPLER ECHOCARDIOGRAPHY  1998   EF 60%   IR FLUORO GUIDE CV LINE LEFT  04/21/2020   IR US GUIDE VASC ACCESS LEFT  04/21/2020   TOTAL KNEE ARTHROPLASTY  12/2011   Left   TOTAL KNEE ARTHROPLASTY  11/28/2011   Procedure: TOTAL KNEE ARTHROPLASTY;  Surgeon: Ann Dibble, MD;  Location: [place];  Service: Orthopedics;  Laterality: Right;   Family History  Family History  Problem Relation Age of Onset   Heart attack Father    Heart disease Sister    Heart disease Brother    Diabetes Mother    Hypertension Mother    Heart disease Mother    Heart attack Mother    Stroke Mother  Colon cancer Neg Hx    Social History  reports that she has never smoked. Her smokeless tobacco use includes snuff. She reports that she does not drink alcohol and does not use drugs. Allergies  Allergies  Allergen Reactions   Penicillins Anaphylaxis   Codeine Nausea And Vomiting   Metronidazole Itching   Home medications Prior to Admission medications   Medication Sig Start Date End Date Taking? Authorizing Provider  acetaminophen (TYLENOL) 325 MG tablet Take 650 mg by mouth every 4 (four) hours as needed for moderate pain.   Yes [provider]  albuterol (VENTOLIN HFA) 108 (90 Base) MCG/ACT inhaler Inhale 2 puffs into the lungs every 4 (four) hours as needed for wheezing or shortness of breath. 05/04/20  Yes Medina-Vargas, Ann [name]  amLODipine (NORVASC) 10 MG tablet Take 10 mg by mouth  in the morning.   Yes [provider]  apixaban (ELIQUIS) 5 MG TABS tablet Take 1 tablet (5 mg total) by mouth 2 (two) times daily. 05/04/20  Yes Medina-Vargas, Ann [name]  Doxercalciferol (HECTOROL IV) Doxercalciferol (Hectorol) 05/03/20 05/02/21 Yes [provider]  DULoxetine (CYMBALTA) 60 MG capsule Take 1 capsule (60 mg total) by mouth daily. Patient taking differently: Take 60 mg by mouth in the morning. 05/04/20  Yes Medina-Vargas, Ann [name]  furosemide (LASIX) 40 MG tablet Take 40 mg by mouth in the morning.   Yes [provider]  heparin 1000 unit/mL SOLN injection Heparin Sodium (Porcine) 1,000 Units/mL Catheter Lock Arterial 04/23/20 04/22/21 Yes [provider]  HUMALOG KWIKPEN 100 UNIT/ML KwikPen Inject 4 Units into the skin 3 (three) times daily. 06/24/20  Yes [provider]  hydrALAZINE (APRESOLINE) 50 MG tablet Take 1 tablet (50 mg total) by mouth 3 (three) times daily. 05/04/20  Yes Medina-Vargas, Ann [name]  HYDROcodone-acetaminophen (NORCO/VICODIN) 5-325 MG tablet Take 1 tablet by mouth every 4 (four) hours as needed for moderate pain. 10/14/20 10/14/21 Yes Setzer, Ann [name]  iron sucrose in sodium chloride 0.9 % 100 mL Iron Sucrose (Venofer) 09/01/20 08/24/21 Yes [provider]  isosorbide mononitrate (IMDUR) 30 MG 24 hr tablet Take 30 mg by mouth in the morning.   Yes [provider]  linagliptin (TRADJENTA) 5 MG TABS tablet Take 5 mg by mouth in the morning.   Yes [provider]  Methoxy PEG-Epoetin Beta (MIRCERA IJ) Mircera 07/12/20 09/05/21 Yes [provider]  metoprolol tartrate (LOPRESSOR) 25 MG tablet Take 25 mg by mouth in the morning and at bedtime.   Yes [provider]  pantoprazole (PROTONIX) 20 MG tablet Take 1 tablet (20 mg total) by mouth daily. Patient taking differently: Take 20 mg by mouth in the morning. 05/04/20  Yes Medina-Vargas, Ann [name]  pravastatin (PRAVACHOL) 40  MG tablet Take 1 tablet (40 mg total) by mouth every evening. Patient taking differently: Take 40 mg by mouth in the morning. 05/04/20  Yes Medina-Vargas, Ann [name]  rOPINIRole (REQUIP) 0.25 MG tablet SMARTSIG:1 Tablet(s) By Mouth Every Evening 10/21/20  Yes [provider]  TOUJEO SOLOSTAR 300 UNIT/ML Solostar Pen Inject 6 Units into the skin at bedtime. Patient taking differently: Inject 8 Units into the skin at bedtime. 05/04/20  Yes Medina-Vargas, Ann [name]  ACCU-CHEK FASTCLIX LANCETS MISC USE AS DIRECTED TO CHECK BLOOD SUGAR FOUR TIMES A DAY(BEFORE MEALS AND AT BEDTIME). 10/14/12   Sid Falcon, MD  ACCU-CHEK SMARTVIEW test strip CHECK BLOOD SUGAR BEFORE MEALS AND AT BEDTIME 10/14/12  Sid Falcon, MD  Insulin Pen Needle (EASY TOUCH PEN NEEDLES) 31G X 8 MM MISC Use to inject insulin as instructed. 02/02/12   Bartholomew Crews, MD     Vitals:   11/17/20 2320 11/18/20 0300 11/18/20 0507 11/18/20 0738  BP: (!) 171/79 (!) 168/64 (!) 150/86 (!) 157/76  Pulse: 79  82 81  Resp: 16  16 15   Temp: 98.4 F (36.9 C)  98.9 F (37.2 C) 98.7 F (37.1 C)  TempSrc: Oral  Oral Axillary  SpO2: 99%  100% 96%  Weight:      Height:       Exam Gen alert, no distress No rash, cyanosis or gangrene Sclera anicteric, throat clear  No jvd or bruits Chest clear bilat to bases, no rales/ wheezing RRR no MRG Abd soft ntnd no mass or ascites +bs GU normal MS no joint effusions or deformity Ext no LE or UE edema, no wounds or ulcers Neuro is alert, Ox 3 , nf TDC, maturing RUA AVF       Home meds include norvasc , eliquis, cymbalta, lasix, hydralazine, vicodin, imdur, tradjenta, lopressor, protonix , pravachol, requip and toujeo insulin        [facility] HD: [facility] [place]  4h  400/500  88.5kg  3K/2Ca bath  TDC / RUAAVF maturing Hep 2000  - hectorol 2 ug tiw  - venofer 50 q wk  - mircera 50 q2 last 6.27  - last HD 7.18  - no binder     Assessment/ Plan: Presyncope - w/ recent  vertigo. Orthostatics are wnl. Per pmd ESRD - on HD MWF. HD today off schedule.  HTN/volume - is under dry wt, but BP's high. Will try 1-2 L UF as tolerated. Orthostatics were wnl.  Urinary incontinence - no fevers, w/u in progress per pmd Chest pain - w/ hx CAD / PCI.  EKG wnl, w/u per primary team.  Anemia ckd - Hb 11, no need esa for now MBD ckd - Ca okay, not on binder, cont vdra      Rob Manley Fason  MD 11/18/2020, 8:59 AM  Recent Labs  Lab 11/17/20 0609 11/17/20 0612 11/18/20 0022  WBC 7.6  --  7.0  HGB 11.2* 11.6* 10.7*   Recent Labs  Lab 11/17/20 0609 11/17/20 0612 11/18/20 0022  K 3.8 4.1 3.9  BUN 25* 29* 27*  CREATININE 4.52* 4.80* 4.67*  CALCIUM 9.6  --  9.2

## 2020-11-18 NOTE — Hospital Course (Addendum)
Pre-syncopal episode Yesterday, the patient had a pre-syncopal episode consistent with prior vertigo and hypoglycemia. Pt presented to ED with glucose at 66, however patient ate well in the hospital, and her glucose levels improved to high 100s, and she had no other hypoglycemic episodes in the hospital. EKG benign without bradycardia or bundle branch blocks. Neuro exam reassuring. Electrolytes normal. The patient did have positive orthostatics in the hospital (168/64, pulse 77 lying, BP 140/78, pulse 96 sitting, and 114/69, pulse 96 standing). Prior to discharge, we decreased her metoprolol regimen from 25 mg BID to 12.5 mg BID.    Unstable angina Different pattern of chest pain episodes x1 week with substernal pressure at rest and with activity. EKG without ST changes. Stable serial troponins. Hemodynamically stable during her hospitalization. She is currently on Eliquis for December DVT. Initiated 325 mg ASA as inpatient and continued her home pravastatin, metoprolol, Imdur, hydralazine, and Eliquis. She was monitored with telemetry and had no recurrent symptoms.    Urinary incontinence Per the patient's daughter, the patient has been having incontinence with recent urgency. She states that this has been a problem for her for the past few weeks. She denies fevers and dysuria. Prior to her missing her hemodialysis sessions last week, she was able to void about 4 times a day without difficulty. UA showed sterile pyuria.   4. Benign Paroxysmal Positional Vertigo: Patient endorses sensations of room-spinning provoked by posture changes such as rolling over in bed. No tinnitus or headaches.  We recommend follow-up with vestibulocochlear PT in the outpatient setting.   5. ESRD, on HD MWF Pt missed last three sessions of HD before her admission to the hospital. BUN not impressive at 25. We consulted nephrology for HD management, and she started her first session here on July 28.   6. DMII A1c not as  reliable for HD patients- CBG monitoring, check your glucose levels 2x a day but alter when you do it. consider switching long-acting to 4 units. Follow-up with your PCP to discuss further.   Memory problems Consider Ann [name]

## 2020-11-18 NOTE — Progress Notes (Addendum)
PT Cancellation Note  Patient Details Name: Ann [name] MRN: [id number] DOB: [birth year]   Cancelled Treatment:    Reason Eval/Treat Not Completed: Patient at procedure or test/unavailable Pt currently at HD. Will follow up as schedule allows.   1319: Checked on pt a second time and still in HD. Will follow up as schedule allows.    Lou Miner, DPT  Acute Rehabilitation Services  Pager: [phone] Office: [phone]    Rudean Hitt 11/18/2020, 9:43 AM

## 2020-11-18 NOTE — Progress Notes (Signed)
Physical Therapy Evaluation Patient Details Name: Ann [name] MRN: [id number] DOB: 10/28/1950 Today's Date: 11/18/2020   History of Present Illness  Pt is a 70yo female who [facility] to [facility] ED on 7/27 with near syncope. Of note, pt had skipped 3 HD treatments prior to presentation. Pt also reported tinnitus and room spinning. Upon admittance, found to have possible hypoglycemia. Xray revealed decreased lung volumes and atelectasis. PMH: Anemia, angina, asthma, CAD, ESRD on HD MWF, HTN, DM1, sleep apnea, cardiac cath, Bilateral TKAs.  Clinical Impression  Pt presents with the impairments listed above and problems below. Pt required min to mod assist for bed mobility and transfers. Pt's orthostatics were positive; supine (139/60) seated (143/68) and standing (126/58). Pt denied symptoms during testing; however, on return to sitting did report dizziness. Of note, pt was seen post-HD and presented very fatigued which limited mobility tolerance. Pt wanting to return home and reports 24/7 help at home so recommending HHPT. Pt will benefit from acute skilled therapy to promote functional independence.   11/18/20 1424  Orthostatic Lying   BP- Lying 139/60  Pulse- Lying 82  Orthostatic Sitting  BP- Sitting 143/68  Pulse- Sitting 90  Orthostatic Standing at 0 minutes  BP- Standing at 0 minutes 126/58  Pulse- Standing at 0 minutes 105      Follow Up Recommendations Home health PT;Supervision/Assistance - 24 hour    Equipment Recommendations  None recommended by PT (Pt already has equipment)    Recommendations for Other Services       Precautions / Restrictions Precautions Precautions: Fall Precaution Comments: Pt had near syncopal episode PTA; watch BP Restrictions Weight Bearing Restrictions: No      Mobility  Bed Mobility Overal bed mobility: Needs Assistance Bed Mobility: Supine to Sit;Sit to Sidelying     Supine to sit: Mod assist   Sit to sidelying: Min assist General bed  mobility comments: Pt required mod assist for supine to sit for trunk righting and used UE to pull up; pt required min assist for LE assist sit to sidelying. Required multimodal cues for technique; pt reported increased back pain during bed mobility.    Transfers Overall transfer level: Needs assistance Equipment used: Rolling walker (2 wheeled);1 person hand held assist Transfers: Sit to/from Stand Sit to Stand: Min assist         General transfer comment: pt required min assist for power to acheive standing with multimodal cues for hand placement; pt required 2 attempts to acheive standing secondary to pain and fatigue. recent HD treatment, which limited further mobility as pt very fatigued. Did note drop in BP (see vitals) as well and pt became shaky with prolonged standing.  Ambulation/Gait                Stairs            Wheelchair Mobility    Modified Rankin (Stroke Patients Only)       Balance Overall balance assessment: Needs assistance Sitting-balance support: Single extremity supported;Feet supported Sitting balance-Leahy Scale: Fair     Standing balance support: Bilateral upper extremity supported Standing balance-Leahy Scale: Poor Standing balance comment: Pt required BUE support on RW and propped on her left elbow on the walker x2 during standing secondary to fatigue                             Pertinent Vitals/Pain Pain Assessment: 0-10 Pain Score: 6  Pain Location: "in my  back" Pain Descriptors / Indicators: Grimacing;Discomfort;Moaning Pain Intervention(s): Limited activity within patient's tolerance;Monitored during session;Repositioned      Home Living Family/patient expects to be discharged to:: Private residence Living Arrangements: Other relatives (granddaughter) Available Help at Discharge: Family;Available 24 hours/day Type of Home: Apartment Home Access: Level entry     Home Layout: One level Home Equipment: Walker  - 4 wheels;Cane - single point;Bedside commode;Wheelchair - manual      Prior Function Level of Independence: Needs assistance   Gait / Transfers Assistance Needed: prior to admission, used RW at home and in community  ADL's / Homemaking Assistance Needed: Pt reports independence with ADLs but that her grandchildren "won't let me do anything"  Comments: Pt gets transportation to HD via "bigwheel"     Hand Dominance        Extremity/Trunk Assessment   Upper Extremity Assessment Upper Extremity Assessment: LUE deficits/detail;Defer to OT evaluation LUE Deficits / Details: Pt reported "cramping" in her hand on the dorsal surface near the thumb; upon palpation no muscular tension or gaurding was noted but pain did not change with superficial massage    Lower Extremity Assessment Lower Extremity Assessment: Generalized weakness    Cervical / Trunk Assessment Cervical / Trunk Assessment: Kyphotic  Communication   Communication: No difficulties  Cognition Arousal/Alertness: Awake/alert Behavior During Therapy: WFL for tasks assessed/performed Overall Cognitive Status: No family/caregiver present to determine baseline cognitive functioning                                 General Comments: Pt recently returned to room post-HD; pt reports on her HD days she usually just sleeps. Very fatigued and slow to answer questions      General Comments General comments (skin integrity, edema, etc.): BP in sitting 143/68 and BP in standing 126/58    Exercises     Assessment/Plan    PT Assessment Patient needs continued PT services  PT Problem List Decreased strength;Decreased range of motion;Decreased activity tolerance;Decreased balance;Decreased mobility;Decreased coordination;Cardiopulmonary status limiting activity;Pain       PT Treatment Interventions Gait training;Stair training;Functional mobility training;Therapeutic activities;Therapeutic exercise;Balance  training;Patient/family education;DME instruction    PT Goals (Current goals can be found in the Care Plan section)  Acute Rehab PT Goals Patient Stated Goal: to go home PT Goal Formulation: With patient Time For Goal Achievement: 12/02/20 Potential to Achieve Goals: Good    Frequency Min 3X/week   Barriers to discharge        Co-evaluation               AM-PAC PT "6 Clicks" Mobility  Outcome Measure Help needed turning from your back to your side while in a flat bed without using bedrails?: A Little Help needed moving from lying on your back to sitting on the side of a flat bed without using bedrails?: A Little Help needed moving to and from a bed to a chair (including a wheelchair)?: A Lot Help needed standing up from a chair using your arms (e.g., wheelchair or bedside chair)?: A Little Help needed to walk in hospital room?: A Lot Help needed climbing 3-5 steps with a railing? : A Lot 6 Click Score: 15    End of Session Equipment Utilized During Treatment: Gait belt Activity Tolerance: Patient limited by fatigue (post-HD fatigue) Patient left: in bed;with call bell/phone within reach;with bed alarm set Nurse Communication: Mobility status;Other (comment) (BPs) PT Visit Diagnosis: Muscle weakness (  generalized) (M62.81);Unsteadiness on feet (R26.81)    Time: 3142-7670 PT Time Calculation (min) (ACUTE ONLY): 26 min   Charges:   PT Evaluation $PT Eval Moderate Complexity: 1 Mod PT Treatments $Therapeutic Activity: 8-22 mins        Dawayne Cirri, SPT   Dawayne Cirri 11/18/2020, 4:07 PM

## 2020-11-18 NOTE — Procedures (Signed)
   I was present at this dialysis session, have reviewed the session itself and made  appropriate changes Kelly Splinter MD [place] pager [phone]   11/18/2020, 9:44 AM

## 2020-11-18 NOTE — Plan of Care (Signed)
  Problem: Education: Goal: Knowledge of General Education information will improve Description: Including pain rating scale, medication(s)/side effects and non-pharmacologic comfort measures Outcome: Progressing   Problem: Clinical Measurements: Goal: Ability to maintain clinical measurements within normal limits will improve Outcome: Progressing   Problem: Nutrition: Goal: Adequate nutrition will be maintained Outcome: Progressing   Problem: Pain Managment: Goal: General experience of comfort will improve Outcome: Progressing   Problem: Safety: Goal: Ability to remain free from injury will improve Outcome: Progressing   Problem: Skin Integrity: Goal: Risk for impaired skin integrity will decrease Outcome: Progressing   

## 2020-11-18 NOTE — Care Management Obs Status (Signed)
[facility] NOTIFICATION   Patient Details  Name: Ann [name] MRN: [id number] Date of Birth: 05-13-1950   Medicare Observation Status Notification Given:     Verbal permission  given  to sign for patient by patient due to remote   Verdell Carmine, RN 11/18/2020, 5:04 PM

## 2020-11-18 NOTE — TOC Initial Note (Signed)
Transition of Care [facility]) - Initial/Assessment Note    Patient Details  Name: Ann [name] MRN: [id number] Date of Birth: 1950/07/27  Transition of [facility]) CM/SW Contact:    Verdell Carmine, RN Phone Number: 11/18/2020, 5:20 PM  Clinical Narrative:                  Patient in OBS for syncope, Patient has a history of ESRD dialysis M-W-F She lives at home with her granddaughter. She is recommended PT home health, she has had this before and wants to go with [facility].  She states she has all DME needed.  She may need transportation home., but she is hoping her granddaughter will pick her up. Reached out to [facility], left message and will fax over information in the AM.  Expected Discharge Plan: [place] Barriers to Discharge: No Barriers Identified   Patient Goals and CMS Choice Patient states their goals for this hospitalization and ongoing recovery are:: to go home      Expected Discharge Plan and Services Expected Discharge Plan: [place]   Discharge Planning Services: CM Consult   Living arrangements for the past 2 months: Single Family Home                           HH Arranged: PT HH Agency: [place] Date [place]: 11/18/20 Time HH Agency Contacted: 64 Representative spoke with at [place]: Left messsage /need to fax orders or=ver when obtained  Prior Living Arrangements/Services Living arrangements for the past 2 months: [place] Lives with:: Relatives Advertising account executive) Patient language and need for interpreter reviewed:: Yes Do you feel safe going back to the place where you live?: Yes      Need for Family Participation in Patient Care: Yes (Comment) Care giver support system in place?: Yes (comment) Current home services: DME (has cane, walker, and BSC) Criminal Activity/Legal Involvement Pertinent to Current Situation/Hospitalization: No - Comment as  needed  Activities of Daily Living Home Assistive Devices/Equipment: Environmental consultant (specify type), Shower chair with back (4 wheel walker with seat at home) ADL Screening (condition at time of admission) Patient's cognitive ability adequate to safely complete daily activities?: Yes Is the patient deaf or have difficulty hearing?: No Does the patient have difficulty seeing, even when wearing glasses/contacts?: No Does the patient have difficulty concentrating, remembering, or making decisions?: No Patient able to express need for assistance with ADLs?: Yes Does the patient have difficulty dressing or bathing?: Yes Independently performs ADLs?: No Communication: Independent Dressing (OT): Independent Grooming: Independent Feeding: Independent Bathing: Independent Toileting: Independent In/Out Bed: Independent Walks in Home: Independent with device (comment) Does the patient have difficulty walking or climbing stairs?: Yes Weakness of Legs: Both Weakness of Arms/Hands: None  Permission Sought/Granted                  Emotional Assessment   Attitude/Demeanor/Rapport: Engaged Affect (typically observed): Pleasant Orientation: : Oriented to Self, Oriented to Place, Oriented to Situation Alcohol / Substance Use: Not Applicable Psych Involvement: No (comment)  Admission diagnosis:  Hypoglycemia [E16.2] Patient Active Problem List   Diagnosis Date Noted   Hypoglycemia 11/17/2020   Syncope, near 11/17/2020   Dependence on renal dialysis ([place]) 06/22/2020   Unspecified protein-calorie malnutrition ([place]) 05/05/2020   Decubitus ulcer of buttock 04/22/2020   Allergy, unspecified, initial encounter 04/22/2020   Anaphylactic shock, unspecified, initial encounter  04/22/2020   Anemia in chronic kidney disease 04/22/2020   Coagulation defect, unspecified ([place]) 22/63/3354   Complication of vascular dialysis catheter 04/22/2020   End stage renal disease ([place]) 04/22/2020   Iron deficiency  anemia, unspecified 04/22/2020   Obstructive sleep apnea 04/22/2020   Other asthma 04/22/2020   Pruritus, unspecified 04/22/2020   Secondary hyperparathyroidism of renal origin ([place]) 04/22/2020   Shortness of breath 04/22/2020   Underimmunization status 04/22/2020   Acute respiratory failure with hypoxia ([place]) 04/11/2020   AV node arrhythmia 04/04/2020   AKI (acute kidney injury) ([place]) 04/04/2020   Acute renal failure ([place]) 04/04/2020   2019 novel coronavirus disease (COVID-19) 04/03/2020   GERD (gastroesophageal reflux disease)    Constipation 02/25/2019   Pyelonephritis 02/20/2019   Acute encephalopathy 02/20/2019   Chronic pain 02/20/2019   Obesity, Class III, BMI 40-49.9 (morbid obesity) ([place]) 01/28/2013   Uncontrolled type II diabetes mellitus with hyperglycemia ([place]) 03/25/2012   Health care maintenance 03/25/2012   Acute blood loss anemia 12/01/2011    Class: Acute   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    Class: Chronic   Plantar fasciitis of right foot 09/28/2011   Breast pain 03/08/2011   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   Knee pain, right 01/20/2011   Thoracic spine pain 12/13/2010   Hyperlipidemia 10/21/2009   CKD (chronic kidney disease), stage IV ([place]) 10/20/2009   UNSPECIFIED ANEMIA 09/29/2008   HYPERTENSION 09/29/2008   CAD (coronary artery disease) 09/29/2008   ASTHMA, UNSPECIFIED, UNSPECIFIED STATUS 09/29/2008   PCP:  Cipriano Mile, NP Pharmacy:   [facility] [phone] [place], [place] [address] [place] [address] Phone: [phone] Fax: [place], [place] - [address] [place] [address] Phone: [phone] Fax: [phone]  [facility] [address] [place] [address] Phone: [phone] Fax: [phone]     Social Determinants of Health (SDOH) Interventions    Readmission Risk  Interventions Readmission Risk Prevention Plan 02/23/2019  Transportation Screening Complete  PCP or Specialist Appt within 5-7 Days Complete  Home Care Screening Not Complete  Home Care Screening Not Completed Comments NA  Medication Review (RN CM) Not Complete  Med Review comments NA  Some recent data might be hidden

## 2020-11-19 DIAGNOSIS — E162 Hypoglycemia, unspecified: Secondary | ICD-10-CM | POA: Diagnosis not present

## 2020-11-19 DIAGNOSIS — N186 End stage renal disease: Secondary | ICD-10-CM

## 2020-11-19 DIAGNOSIS — R55 Syncope and collapse: Secondary | ICD-10-CM | POA: Diagnosis not present

## 2020-11-19 DIAGNOSIS — E1122 Type 2 diabetes mellitus with diabetic chronic kidney disease: Secondary | ICD-10-CM | POA: Diagnosis not present

## 2020-11-19 LAB — HEPATITIS B SURFACE ANTIBODY, QUANTITATIVE: Hep B S AB Quant (Post): >1000 m[IU]/mL (ref >9.9)

## 2020-11-19 LAB — BASIC METABOLIC PANEL
Anion gap: 10 (ref 5–15)
BUN: 11 mg/dL (ref 8–23)
CO2: 27 mmol/L (ref 22–32)
Calcium: 9.2 mg/dL (ref 8.9–10.3)
Chloride: 99 mmol/L (ref 98–111)
Creatinine, Ser: 3.33 mg/dL — ABNORMAL HIGH (ref 0.44–1.00)
GFR, Estimated: 14 mL/min — ABNORMAL LOW (ref >60)
Glucose, Bld: 107 mg/dL — ABNORMAL HIGH (ref 70–99)
Potassium: 3.9 mmol/L (ref 3.5–5.1)
Sodium: 136 mmol/L (ref 135–145)

## 2020-11-19 LAB — GLUCOSE, CAPILLARY
Glucose-Capillary: 113 mg/dL — ABNORMAL HIGH (ref 70–99)
Glucose-Capillary: 207 mg/dL — ABNORMAL HIGH (ref 70–99)
Glucose-Capillary: 119 mg/dL — ABNORMAL HIGH (ref 70–99)
Glucose-Capillary: 152 mg/dL — ABNORMAL HIGH (ref 70–99)

## 2020-11-19 MED ORDER — PRAVASTATIN SODIUM 40 MG PO TABS: 40.0000 mg | ORAL_TABLET | Freq: Every morning | ORAL | Status: DC

## 2020-11-19 MED ORDER — DULOXETINE HCL 60 MG PO CPEP
60.0000 mg | ORAL_CAPSULE | Freq: Every morning | ORAL | Status: DC
Start: 2020-11-19 — End: 2022-02-18

## 2020-11-19 MED ORDER — TOUJEO SOLOSTAR 300 UNIT/ML ~~LOC~~ SOPN: 4.0000 [IU] | PEN_INJECTOR | Freq: Every day | SUBCUTANEOUS | 0 refills | Status: AC

## 2020-11-19 MED ORDER — ASPIRIN EC 81 MG PO TBEC: 81.0000 mg | DELAYED_RELEASE_TABLET | Freq: Every day | ORAL | 2 refills | Status: DC

## 2020-11-19 MED ORDER — PRAVASTATIN SODIUM 40 MG PO TABS: 40.0000 mg | ORAL_TABLET | Freq: Every morning | ORAL | 0 refills | Status: AC

## 2020-11-19 MED ORDER — ATORVASTATIN CALCIUM 40 MG PO TABS: 40.0000 mg | ORAL_TABLET | Freq: Every day | ORAL | 11 refills | Status: DC

## 2020-11-19 MED ORDER — METOPROLOL TARTRATE 25 MG PO TABS: 12.5000 mg | ORAL_TABLET | Freq: Two times a day (BID) | ORAL | 0 refills | Status: DC

## 2020-11-19 MED ORDER — PANTOPRAZOLE SODIUM 20 MG PO TBEC
20.0000 mg | DELAYED_RELEASE_TABLET | Freq: Every morning | ORAL | Status: AC
Start: 2020-11-19 — End: ?

## 2020-11-19 NOTE — Evaluation (Signed)
Occupational Therapy Evaluation Patient Details Name: Ann [name] MRN: [id number] DOB: [birth year] Today's Date: 11/19/2020    History of Present Illness Pt is a 70yo female who presents to [facility] ED on 7/27 with near syncope. Of note, pt had skipped 3 HD treatments prior to presentation. Pt also reported tinnitus and room spinning. Upon admittance, found to have possible hypoglycemia. Xray revealed decreased lung volumes and atelectasis. PMH: Anemia, angina, asthma, CAD, ESRD on HD MWF, HTN, DM1, sleep apnea, cardiac cath, Bilateral TKAs.   Clinical Impression   Ann [name] is a 70 year old woman admitted to hospital with above medical history. On evaluation she demonstrates the ability to ambulate with RW and perform ADLs. Patient reports she is at her baseline. Reports recent chronic back pain and some "dizzy spells" at home. Patient reports feeling like her normal and eager to go home. Patient able to perform full bath task at edge of bed after being found soaked in urine. She reports incontinence and use of diapers at her baseline. Patient's vital signs monitored - see vital sign section without overt symptomology. She did report some mild dizziness, shakiness after prolonged sitting but it passed. Patient provided with min guard to ambulate around the room without overt loss of balance. Patient appears to be at her baseline. No OT needs at this time.      Follow Up Recommendations  No OT follow up    Equipment Recommendations  None recommended by OT    Recommendations for Other Services       Precautions / Restrictions Precautions Precautions: Fall Precaution Comments: Complains of mild dizziness with extended sitting, had syncopal episode with PT Restrictions Weight Bearing Restrictions: No      Mobility Bed Mobility Overal bed mobility: Modified Independent             General bed mobility comments: increased time to transfer to side of bed but no physical assistance     Transfers Overall transfer level: Needs assistance Equipment used: Rolling walker (2 wheeled) Transfers: Sit to/from Stand           General transfer comment: min guard for standing and ambulation in room with RW.    Balance Overall balance assessment: Mild deficits observed, not formally tested                                         ADL either performed or assessed with clinical judgement   ADL Overall ADL's : At baseline                                       General ADL Comments: Demonstrated physical abilities to perform all ADLs while perform bathing task at side of bed.     Vision Patient Visual Report: No change from baseline       Perception     Praxis      Pertinent Vitals/Pain Pain Assessment: Faces Faces Pain Scale: Hurts little more Pain Location: "in my back" Pain Descriptors / Indicators: Grimacing;Discomfort Pain Intervention(s): Limited activity within patient's tolerance;Monitored during session     Hand Dominance     Extremity/Trunk Assessment Upper Extremity Assessment Upper Extremity Assessment: Overall WFL for tasks assessed   Lower Extremity Assessment Lower Extremity Assessment: Defer to PT evaluation   Cervical / Trunk  Assessment Cervical / Trunk Assessment: Kyphotic   Communication Communication Communication: No difficulties   Cognition Arousal/Alertness: Awake/alert Behavior During Therapy: WFL for tasks assessed/performed Overall Cognitive Status: Within Functional Limits for tasks assessed                                     General Comments       Exercises     Shoulder Instructions      Home Living Family/patient expects to be discharged to:: Private residence Living Arrangements: Other relatives (granddaughter) Available Help at Discharge: Family;Available 24 hours/day Type of Home: Apartment Home Access: Level entry     Home Layout: One level     Bathroom  Shower/Tub: Teacher, early years/pre: Standard Bathroom Accessibility: Yes   Home Equipment: Walker - 4 wheels;Cane - single point;Bedside commode;Wheelchair - manual          Prior Functioning/Environment Level of Independence: Needs assistance  Gait / Transfers Assistance Needed: prior to admission, used RW at home and in community ADL's / [place] Needed: Pt reports independence with ADLs but that her grandchildren "won't let me do anything"   Comments: Pt gets transportation to HD via "bigwheel"        OT Problem List:        OT Treatment/Interventions:      OT Goals(Current goals can be found in the care plan section) Acute Rehab OT Goals Patient Stated Goal: to go home OT Goal Formulation: All assessment and education complete, DC therapy  OT Frequency:     Barriers to D/C:            Co-evaluation              AM-PAC OT "6 Clicks" Daily Activity     Outcome Measure Help from another person eating meals?: None Help from another person taking care of personal grooming?: None Help from another person toileting, which includes using toliet, bedpan, or urinal?: None Help from another person bathing (including washing, rinsing, drying)?: None Help from another person to put on and taking off regular upper body clothing?: None Help from another person to put on and taking off regular lower body clothing?: None 6 Click Score: 24   End of Session Equipment Utilized During Treatment: Rolling walker Nurse Communication:  (needs bed linen change)  Activity Tolerance: Patient tolerated treatment well Patient left: in chair;with call bell/phone within reach  OT Visit Diagnosis: Muscle weakness (generalized) (M62.81)                Time: 1497-0263 OT Time Calculation (min): 27 min Charges:  OT General Charges $OT Visit: 1 Visit OT Evaluation $OT Eval Low Complexity: 1 Low OT Treatments $Self Care/Home Management : 8-22 mins  Derl Barrow,  OTR/L [place]  Office [phone] Pager: [place] 11/19/2020, 10:59 AM

## 2020-11-19 NOTE — Discharge Instructions (Addendum)
Ms. Ann, [name] were hospitalized due to your near passing out event.  We think that this might have been due to your low blood sugars and dehydration.  Please note the following changes to your medications:  STOP taking Lasix  DECREASE Lantus to 4 units daily DECREASE metoprolol to 12.5 mg twice daily  HOLD OFF on taking Humalog until you follow-up with your primary care provider.   Please follow-up with your PCP in 1 to 2 weeks.  Thank you for allowing [place] to be part your care!  ========================================================================================================== Information on my medicine - ELIQUIS (apixaban)  Why was Eliquis prescribed for you? Eliquis was prescribed to treat blood clots that may have been found in the veins of your legs (deep vein thrombosis) or in your lungs (pulmonary embolism) in the past, and to reduce the risk of them occurring again.  What do You need to know about Eliquis ? Your current dose is ONE 5 mg tablet taken TWICE daily.  Eliquis may be taken with or without food.   Try to take the dose about the same time in the morning and in the evening. If you have difficulty swallowing the tablet whole please discuss with your pharmacist how to take the medication safely.  Take Eliquis exactly as prescribed and DO NOT stop taking Eliquis without talking to the doctor who prescribed the medication.  Stopping may increase your risk of developing a new blood clot.  Refill your prescription before you run out.  After discharge, you should have regular check-up appointments with your healthcare provider that is prescribing your Eliquis.    What do you do if you miss a dose? If a dose of ELIQUIS is not taken at the scheduled time, take it as soon as possible on the same day and twice-daily administration should be resumed. The dose should not be doubled to make up for a missed dose.  Important Safety Information A possible side  effect of Eliquis is bleeding. You should call your healthcare provider right away if you experience any of the following: Bleeding from an injury or your nose that does not stop. Unusual colored urine (red or dark brown) or unusual colored stools (red or black). Unusual bruising for unknown reasons. A serious fall or if you hit your head (even if there is no bleeding).  Some medicines may interact with Eliquis and might increase your risk of bleeding or clotting while on Eliquis. To help avoid this, consult your healthcare provider or pharmacist prior to using any new prescription or non-prescription medications, including herbals, vitamins, non-steroidal anti-inflammatory drugs (NSAIDs) and supplements.  This website has more information on Eliquis (apixaban): http://[email or web address].com/eliquis/home

## 2020-11-19 NOTE — Discharge Summary (Addendum)
Name: Ann [name] MRN: [id number] DOB: [birth year] 70 y.o. PCP: Cipriano Mile, NP  Date of Admission: 11/17/2020  5:34 AM Date of Discharge: 11/19/2020 Attending Physician: Sid Falcon, MD  Discharge Diagnosis: 1.  Hypoglycemic episode 2.  Orthostatic hypotension 3.  BPPV 4.  ESRD on MWF 5.  DM II  Discharge Medications: Allergies as of 11/19/2020       Reactions   Penicillins Anaphylaxis   Codeine Nausea And Vomiting   Metronidazole Itching        Medication List     STOP taking these medications    furosemide 40 MG tablet Commonly known as: LASIX       TAKE these medications    Accu-Chek FastClix Lancets Misc USE AS DIRECTED TO CHECK BLOOD SUGAR FOUR TIMES A DAY(BEFORE MEALS AND AT BEDTIME).   Accu-Chek SmartView test strip Generic drug: glucose blood CHECK BLOOD SUGAR BEFORE MEALS AND AT BEDTIME   acetaminophen 325 MG tablet Commonly known as: TYLENOL Take 650 mg by mouth every 4 (four) hours as needed for moderate pain.   albuterol 108 (90 Base) MCG/ACT inhaler Commonly known as: VENTOLIN HFA Inhale 2 puffs into the lungs every 4 (four) hours as needed for wheezing or shortness of breath.   amLODipine 10 MG tablet Commonly known as: NORVASC Take 10 mg by mouth in the morning.   apixaban 5 MG Tabs tablet Commonly known as: ELIQUIS Take 1 tablet (5 mg total) by mouth 2 (two) times daily.   aspirin EC 81 MG tablet Take 1 tablet (81 mg total) by mouth daily. Swallow whole.   DULoxetine 60 MG capsule Commonly known as: CYMBALTA Take 1 capsule (60 mg total) by mouth in the morning.   HECTOROL IV Doxercalciferol (Hectorol)   heparin 1000 unit/mL Soln injection Heparin Sodium (Porcine) 1,000 Units/mL Catheter Lock Arterial   HumaLOG KwikPen 100 UNIT/ML KwikPen Generic drug: insulin lispro Inject 4 Units into the skin 3 (three) times daily.   hydrALAZINE 50 MG tablet Commonly known as: APRESOLINE Take 1 tablet (50 mg total) by mouth 3  (three) times daily.   HYDROcodone-acetaminophen 5-325 MG tablet Commonly known as: NORCO/VICODIN Take 1 tablet by mouth every 4 (four) hours as needed for moderate pain.   Insulin Pen Needle 31G X 8 MM Misc Commonly known as: Easy Touch Pen Needles Use to inject insulin as instructed.   iron sucrose in sodium chloride 0.9 % 100 mL Iron Sucrose (Venofer)   isosorbide mononitrate 30 MG 24 hr tablet Commonly known as: IMDUR Take 30 mg by mouth in the morning.   linagliptin 5 MG Tabs tablet Commonly known as: TRADJENTA Take 5 mg by mouth in the morning.   metoprolol tartrate 25 MG tablet Commonly known as: LOPRESSOR Take 0.5 tablets (12.5 mg total) by mouth in the morning and at bedtime. What changed: how much to take   MIRCERA IJ Mircera   pantoprazole 20 MG tablet Commonly known as: PROTONIX Take 1 tablet (20 mg total) by mouth in the morning.   pravastatin 40 MG tablet Commonly known as: PRAVACHOL Take 1 tablet (40 mg total) by mouth in the morning. Start taking on: November 20, 2020   rOPINIRole 0.25 MG tablet Commonly known as: REQUIP SMARTSIG:1 Tablet(s) By Mouth Every Evening   Toujeo SoloStar 300 UNIT/ML Solostar Pen Generic drug: insulin glargine (1 Unit Dial) Inject 4 Units into the skin at bedtime. What changed: how much to take        Disposition and  follow-up:   [name] was discharged from [facility] in Good condition.  At the hospital follow up visit please address:  1.  Hypoglycemic episode, orthostatic hypotension, DM II: The patient presented with a presyncopal episode consistent with prior vertigo and hyperglycemia. Orthostatics were positive, so we gave her a bolus of LR and decreased her metoprolol regimen from 25 mgs twice daily to 12.5 mg twice daily. We subsequently repeated orthostatics which were normal.  In order to decrease risk of having syncopal event, we also discontinued her Lasix regimen and decrease her home  basal insulin regimen from 6 units to 4 units. Given patient's presentation likely due to hypoglycemia and questionable food intake, consider outpatient continuous glucose monitoring. Discharged with home health PT.  2. Unstable angina: Patient initially presented with new pattern of intermittent chest pain which have occurred for a week prior to her presentation here.  She describes this as substernal pressure at rest and with activity. ACS work-up was unremarkable and no recurrent symptoms in the hospital.  3. Benign Paroxysmal Positional Vertigo: Patient endorses chronic episodic sensations of room spinning provoked by postural changes such as rolling over in bed.  She denies tinnitus or headaches.  Likely consistent with BPPV, so recommend outpatient follow-up with vestibular cochlear rehab.  4. Memory loss with unspecified chronicity: Pt has had difficulty drawing forth information and notes this has been the case for a while. No known hx of strokes, though does have risk factors. Could have ischemic dementia at baseline.   5. ESRD on MWF: Prior to her admission, patient had missed 3 days of HD.  She was started back on HD on Thursday, July 28 and was instructed to attend outpatient HD on Saturday, July 30.  She will resume her normal Monday Wednesday Friday schedule next week.  Labs / imaging needed at time of follow-up: None  Pending labs/ test needing follow-up: None  Follow-up Appointments:   Hospital Course by problem list: Pre-syncopal episode Patient had a pre-syncopal episode consistent with prior vertigo and hypoglycemia. Pt presented to ED with glucose at 66, however patient ate well in the hospital, and her glucose levels improved to high 100s, and she had no other hypoglycemic episodes here. EKG benign without bradycardia or bundle branch blocks. Neuro exam reassuring. Electrolytes normal. The patient did have positive orthostatics in the hospital (168/64, pulse 77 lying, BP  140/78, pulse 96 sitting, and 114/69, pulse 96 standing).  After decreasing her metoprolol regimen from 25 mg twice daily to 12.5 mg twice daily and after giving the patient 500 mL bolus of LR, we obtained repeat orthostatics which were negative. Prior to discharge, we also decreased her home basal insulin regimen from 6 units to 4 units, and we discontinued her Lasix regimen.    Unstable angina Different pattern of chest pain episodes x1 week with substernal pressure at rest and with activity. EKG without ST changes. Stable serial troponins. Hemodynamically stable during her hospitalization. She is currently on Eliquis for December DVT. Initiated 325 mg ASA as inpatient and continued her home pravastatin, metoprolol (12.5 mg BID), Imdur, hydralazine, and Eliquis. She was monitored with telemetry and had no recurrent symptoms.    Urinary incontinence Per the patient's daughter, the patient has been having incontinence with recent urgency for the past few weeks. The patient denies fevers and dysuria. Prior to her missing her hemodialysis sessions last week, she was able to void about 4 times a day without difficulty. UA showed sterile  pyuria.   4. Benign Paroxysmal Positional Vertigo: Patient endorses sensations of room-spinning provoked by posture changes such as rolling over in bed. No tinnitus or headaches. We recommend follow-up with vestibulocochlear PT in the outpatient setting.   5. ESRD, on HD MWF Pt missed last three sessions of HD before her admission to the hospital. BUN not impressive at 25. We consulted nephrology for HD management, and she started her first session here on July 28.  At discharge, she was told to attend outpatient HD on Saturday, July 30, and she will resume her regular MWF schedule next week.   6. DMII Home diabetic regimen: Humalog, 4 units at mealtime; Tradjenta 5 mg daily; and Lantus 6 units at bedtime. Her A1c in the hospital is now 5.8, however this is less reliable  in the setting of her HD.  Prior to discharge, we decreased her Lantus regimen to 4 units at bedtime. We also instructed the patient to check her glucose levels at least twice daily. Given patient's presentation likely due to hypoglycemia and questionable food intake, consider outpatient continuous glucose monitoring.  Discharge Exam:   BP (!) 162/69 (BP Location: Left Arm)   Pulse 82   Temp 98.5 F (36.9 C) (Oral)   Resp 14   Ht 5\' 2"  (1.575 m)   Wt 95 kg   SpO2 99%   BMI 38.31 kg/m  Discharge exam:   General: NAD, nl appearance HE: Normocephalic, atraumatic , EOMI, Conjunctivae normal ENT: No congestion, no rhinorrhea, no exudate or erythema  Cardiovascular: Normal rate, regular rhythm.  No murmurs, rubs, or gallops Pulmonary : Effort normal, breath sounds normal. No wheezes, rales, or rhonchi Abdominal: soft, nontender, bowel sounds present Musculoskeletal: no swelling, deformity, injury ,or tenderness in extremities Skin: Warm, dry , no bruising, erythema, or rash Neuro: Alert, CN II-XII are grossly intact. No focal deficits Psychiatric/Behavioral: normal mood, normal behavior    Pertinent Labs, Studies, and Procedures:  DG Chest Port 1 View  Result Date: 11/17/2020 CLINICAL DATA:  Syncope. EXAM: PORTABLE CHEST 1 VIEW COMPARISON:  04/11/2020. FINDINGS: Interim removal of right IJ line and placement of a left dual-lumen catheter, tip over SVC. Cardiomegaly. No pulmonary venous congestion. Low lung volumes with mild bibasilar atelectasis. No focal infiltrate. No pleural effusion or pneumothorax. IMPRESSION: 1.  Dual-lumen catheter noted with tip over SVC. 2.  Cardiomegaly.  No pulmonary venous congestion. 3.  Low lung volumes with mild bibasilar atelectasis. Electronically Signed   By: Marcello Moores  Register   On: 11/17/2020 06:54     Discharge Instructions: Discharge Instructions     Call MD for:  persistant dizziness or light-headedness   Complete by: As directed         Signed: Orvis Brill, MD 11/19/2020, 12:07 PM   Pager: [phone]

## 2020-11-19 NOTE — Progress Notes (Signed)
  [facility] Progress Note   Subjective:  Seen in room - feeling better. No CP or dyspnea. Tells me did ok with PT this morning. 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 Heart: RRR; 2/6 murmur Lungs: CTAB Abdomen: soft Extremities: no LE edema Dialysis Access:   Additional Objective Labs: Basic Metabolic Panel: Recent Labs  Lab 11/17/20 0609 11/17/20 0612 11/18/20 0022 11/19/20 0211  NA 140 142 135 136  K 3.8 4.1 3.9 3.9  CL 107 108 104 99  CO2 23  --  23 27  GLUCOSE 66* 68* 161* 107*  BUN 25* 29* 27* 11  CREATININE 4.52* 4.80* 4.67* 3.33*  CALCIUM 9.6  --  9.2 9.2   Liver Function Tests: Recent Labs  Lab 11/17/20 0609  AST 17  ALT 8  ALKPHOS 65  BILITOT 0.4  PROT 6.8  ALBUMIN 3.4*   CBC: Recent Labs  Lab 11/17/20 0609 11/17/20 0612 11/18/20 0022  WBC 7.6  --  7.0  NEUTROABS 4.8  --   --   HGB 11.2* 11.6* 10.7*  HCT 36.2 34.0* 33.0*  MCV 93.3  --  91.4  PLT 201  --  178   Medications:   amLODipine  10 mg Oral q AM   apixaban  5 mg Oral BID   Chlorhexidine Gluconate Cloth  6 each Topical Q0600   DULoxetine  60 mg Oral Daily   hydrALAZINE  50 mg Oral TID   insulin aspart  0-15 Units Subcutaneous TID WC   isosorbide mononitrate  30 mg Oral q AM   metoprolol tartrate  12.5 mg Oral BID   pantoprazole  20 mg Oral q AM   pravastatin  40 mg Oral q AM   rOPINIRole  0.25 mg Oral QHS    Dialysis Orders: [facility]  4h  400/500  88.5kg  3K/2Ca bath  TDC / RUAAVF maturing Hep 2000  - hectorol 2 ug tiw  - venofer 50 q wk  -  mircera 50 q2 last 6.27  - last HD 7.18  - no binder   Assessment/ Plan: Presyncope - w/ recent vertigo. Orthostatics are wnl. Per pmd ESRD: Usual MWF schedule, had missed 1 week prior to admit. Last HD yesterday, plan next HD tomorrow as OP unit, then back to MWF sched. Be there at 12:05pm - d/w primary team. HTN/volume: Variable weights/BP - leaving EDW as is for now. Urinary incontinence - no fevers, w/u in progress per pmd Chest pain - w/ hx CAD / PCI.  EKG wnl, w/u per primary team. Anemia ckd - Hb 10.7, resume ESA as outpatient MBD ckd - Ca okay, not on binder, cont vdra  Veneta Penton, PA-C 11/19/2020, 11:50 AM  [employer]

## 2020-11-19 NOTE — TOC Transition Note (Addendum)
Transition of Care [facility]) - CM/SW Discharge Note   Patient Details  Name: Ann [name] MRN: [id number] Date of Birth: 06-25-1950  [facility]) CM/SW Contact:  Verdell Carmine, RN Phone Number: 11/19/2020, 3:10 PM   Clinical Narrative:     Accepted for PT by [facility].  1700 called patient and stated she is discharged and will provide transportation home and to and from dialysis in AM. Verified address with patient. She did state that she needed some clothes, but her grandaughter, with whom she sttays, does not drive. Contacted RN and asked him to call me when she is dressed and ready to go  1800 RN stated that patient is ready and discharged- ride called for but missed. Another communication from RN stated that patient did not have any clothes, and that she lived in a motel, but did not know if she could get in. This was not expressed by the patient in previous encounters. Notified MD by securechat the situation.   Final next level of care: [place] Barriers to Discharge: No Barriers Identified   Patient Goals and CMS Choice Patient states their goals for this hospitalization and ongoing recovery are:: to go home      Discharge Placement                       Discharge Plan and Services   Discharge Planning Services: CM Consult                      HH Arranged: PT [facility] Agency: [place] Date [place]: 11/19/20 Time [place]: 1510 Representative spoke with at [place]: [place] ([place]) Interventions     Readmission Risk Interventions Readmission Risk Prevention Plan 02/23/2019  Transportation Screening Complete  PCP or Specialist Appt within 5-7 Days Complete  Home Care Screening Not Complete  Home Care Screening Not Completed Comments NA  Medication Review (RN CM) Not Complete  Med Review comments NA  Some recent data might be hidden

## 2020-11-19 NOTE — TOC Progression Note (Addendum)
Transition of Care [facility]) - Progression Note    Patient Details  Name: Ann [name] MRN: [id number] Date of Birth: 1950/08/03  Transition of Care [facility]) CM/SW [place], RN Phone Number: 11/19/2020, 9:29 AM  Clinical Narrative:    [facility] unable to take for PT at this tiime due to staffing , will reach out to other agencies  Expected Discharge Plan: [place] Barriers to Discharge: No Barriers Identified  Expected Discharge Plan and Services Expected Discharge Plan: [place]   Discharge Planning Services: CM Consult   Living arrangements for the past 2 months: Single Family Home                           HH Arranged: PT [place]: [place] Date [place] Agency: 11/18/20 Time [place]: 1718 Representative spoke with at [place]: Left messsage /need to fax orders or=ver when obtained   Social Determinants of Health (SDOH) Interventions    Readmission Risk Interventions Readmission Risk Prevention Plan 02/23/2019  Transportation Screening Complete  PCP or Specialist Appt within 5-7 Days Complete  Home Care Screening Not Complete  Home Care Screening Not Completed Comments NA  Medication Review (RN CM) Not Complete  Med Review comments NA  Some recent data might be hidden

## 2020-11-20 DIAGNOSIS — R55 Syncope and collapse: Secondary | ICD-10-CM | POA: Diagnosis not present

## 2020-11-20 LAB — BASIC METABOLIC PANEL
Anion gap: 9 (ref 5–15)
BUN: 18 mg/dL (ref 8–23)
CO2: 25 mmol/L (ref 22–32)
Calcium: 9.2 mg/dL (ref 8.9–10.3)
Chloride: 102 mmol/L (ref 98–111)
Creatinine, Ser: 4.57 mg/dL — ABNORMAL HIGH (ref 0.44–1.00)
GFR, Estimated: 10 mL/min — ABNORMAL LOW (ref >60)
Glucose, Bld: 157 mg/dL — ABNORMAL HIGH (ref 70–99)
Potassium: 4.5 mmol/L (ref 3.5–5.1)
Sodium: 136 mmol/L (ref 135–145)

## 2020-11-20 LAB — GLUCOSE, CAPILLARY: Glucose-Capillary: 171 mg/dL — ABNORMAL HIGH (ref 70–99)

## 2020-11-20 NOTE — Progress Notes (Addendum)
1100: Patient being discharged via taxi cab to dialysis today per Dr. Laural Golden. I've expressed my concerns regarding this patients needs with mobility and appropriate transportation requirements being greater than what a taxi or [facility] can provide. This has been discussed in depth with Dr. Laural Golden, Dr. Brayton El and case management over two days.This concern was addressed initially on 7/29 when patient was scheduled with [facility] for discharge. Patient arrived to ED 7/27 with near syncopal episode, patient requires walker for ambulation at baseline (which she does not have), patient has unsteady gait at baseline. Patient has also not secured transport home from dialysis at time of discharge, second taxi voucher given. Contact with daughter was not made prior to discharge which makes entrance to her dwelling uncertain post dialysis. Dr. Laural Golden expressed benefit out ways risk as internal dialysis would not be able to accommodate her at this point.

## 2020-11-20 NOTE — Plan of Care (Signed)
  Problem: Education: Goal: Knowledge of condition and prescribed therapy will improve Outcome: Adequate for Discharge   Problem: Cardiac: Goal: Will achieve and/or maintain adequate cardiac output Outcome: Adequate for Discharge   Problem: Physical Regulation: Goal: Complications related to the disease process, condition or treatment will be avoided or minimized Outcome: Adequate for Discharge   Problem: Education: Goal: Knowledge of General Education information will improve Description: Including pain rating scale, medication(s)/side effects and non-pharmacologic comfort measures Outcome: Adequate for Discharge   Problem: Health Behavior/Discharge Planning: Goal: Ability to manage health-related needs will improve Outcome: Adequate for Discharge   Problem: Clinical Measurements: Goal: Ability to maintain clinical measurements within normal limits will improve Outcome: Adequate for Discharge Goal: Will remain free from infection Outcome: Adequate for Discharge Goal: Diagnostic test results will improve Outcome: Adequate for Discharge Goal: Respiratory complications will improve Outcome: Adequate for Discharge Goal: Cardiovascular complication will be avoided Outcome: Adequate for Discharge   Problem: Activity: Goal: Risk for activity intolerance will decrease Outcome: Adequate for Discharge   Problem: Nutrition: Goal: Adequate nutrition will be maintained Outcome: Adequate for Discharge   Problem: Coping: Goal: Level of anxiety will decrease Outcome: Adequate for Discharge   Problem: Elimination: Goal: Will not experience complications related to bowel motility Outcome: Adequate for Discharge Goal: Will not experience complications related to urinary retention Outcome: Adequate for Discharge   Problem: Pain Managment: Goal: General experience of comfort will improve Outcome: Adequate for Discharge   Problem: Safety: Goal: Ability to remain free from injury  will improve Outcome: Adequate for Discharge   Problem: Skin Integrity: Goal: Risk for impaired skin integrity will decrease Outcome: Adequate for Discharge   

## 2020-11-20 NOTE — Progress Notes (Signed)
  [facility] Progress Note   Subjective:  Seen in room - she feels well. Discharge delayed yesterday, but appreciate her primary team working on discharge planning - will ride [facility]pper to HD and then taxi home afterwards.  Objective Vitals:   11/19/20 2341 11/20/20 0446 11/20/20 0649 11/20/20 0900  BP: (!) 144/72 (!) 141/73 (!) 148/84 136/75  Pulse: 84 86  76  Resp: 15 16 15 14   Temp: 98.2 F (36.8 C) 98.5 F (36.9 C)  98.7 F (37.1 C)  TempSrc: Axillary Axillary  Oral  SpO2: 99% 100%  100%  Weight:   87.9 kg   Height:       Physical Exam General: Well appearing, chronically demented woman Heart: RRR; 2/6 murmur Lungs: CTAB Abdomen: soft Extremities: no LE edema Dialysis Access: [facility]  Additional Objective Labs: Basic Metabolic Panel: Recent Labs  Lab 11/18/20 0022 11/19/20 0211 11/20/20 0706  NA 135 136 136  K 3.9 3.9 4.5  CL 104 99 102  CO2 23 27 25   GLUCOSE 161* 107* 157*  BUN 27* 11 18  CREATININE 4.67* 3.33* 4.57*  CALCIUM 9.2 9.2 9.2   Liver Function Tests: Recent Labs  Lab 11/17/20 0609  AST 17  ALT 8  ALKPHOS 65  BILITOT 0.4  PROT 6.8  ALBUMIN 3.4*   CBC: Recent Labs  Lab 11/17/20 0609 11/17/20 0612 11/18/20 0022  WBC 7.6  --  7.0  NEUTROABS 4.8  --   --   HGB 11.2* 11.6* 10.7*  HCT 36.2 34.0* 33.0*  MCV 93.3  --  91.4  PLT 201  --  178   Medications:   amLODipine  10 mg Oral q AM   apixaban  5 mg Oral BID   Chlorhexidine Gluconate Cloth  6 each Topical Q0600   DULoxetine  60 mg Oral Daily   hydrALAZINE  50 mg Oral TID   insulin aspart  0-15 Units Subcutaneous TID WC   isosorbide mononitrate  30 mg Oral q AM   metoprolol tartrate  12.5 mg Oral BID   pantoprazole  20 mg Oral q AM   pravastatin  40 mg Oral q AM   rOPINIRole  0.25 mg Oral QHS    Dialysis Orders: [facility]  4h  400/500  88.5kg  3K/2Ca bath  TDC / RUE AVF maturing Hep 2000  - hectorol 2 ug tiw  - venofer 50 q wk  - mircera 50 q2 last 6.27  - last  HD 7.18  - no binder   Assessment/ Plan: Presyncope - w/ recent vertigo. Orthostatics are wnl. Per pmd ESRD: Usual MWF schedule, had missed 1 week prior to admit. Last HD yesterday, plan next HD today at her OP unit at 12:05p, then back to MWF sched next week. HTN/volume: Variable weights/BP - leaving EDW as is for now. Urinary incontinence - no fevers, w/u in progress per pmd Chest pain - w/ hx CAD / PCI.  EKG wnl, w/u per primary team. Anemia ckd - Hb 10.7, resume ESA as outpatient MBD ckd - Ca okay, not on binder, cont vdra  Veneta Penton, PA-C 11/20/2020, 10:36 AM  [employer]

## 2020-11-20 NOTE — Progress Notes (Signed)
   Subjective: Overnight patient unable to be discharged due to social barriers.  Met with patient this morning states that she is feeling well.  No complaints at this time.  She states that she has transportation from her dialysis center to her home as long as she gets there.  Objective:  Vital signs in last 24 hours: Vitals:   11/19/20 2341 11/20/20 0446 11/20/20 0649 11/20/20 0900  BP: (!) 144/72 (!) 141/73 (!) 148/84 136/75  Pulse: 84 86  76  Resp: $Remo'15 16 15 14  'TZUhk$ Temp: 98.2 F (36.8 C) 98.5 F (36.9 C)  98.7 F (37.1 C)  TempSrc: Axillary Axillary  Oral  SpO2: 99% 100%  100%  Weight:   87.9 kg   Height:       Physical Exam General: alert, appears stated age, in no acute distress HEENT: Normocephalic, atraumatic, EOM intact, conjunctiva normal Skin: Warm and dry Neuro: Alert and oriented x3   Assessment/Plan:  Principal Problem:   Syncope, near Active Problems:   Diabetes mellitus with ESRD (end-stage renal disease) ([place])   Hypoglycemia  Social barriers to discharge Patient was discharged home yesterday with plans for outpatient HD today.  Patient unfortunately missed transportation home nor did she have any clothes or away into her home.  Attempting to provide patient transportation to her outpatient hemodialysis session this morning as well as transportation home from dialysis.  Barriers at this time appear to be revolving around her access to her home.  She states that she does not have her keys and thus far have been unable to get in touch with patient's family.  Hopeful for discharge via taxi or PTAR to hemodialysis and via taxi from hemodialysis to home with her family receiving her.  [facility] states that the latest that they can receive the patient is 1 PM for her to be able to receive outpatient dialysis today.  If she is unable to make it outpatient will discuss possibility of HD with inpatient dialysis team.  ESRD on HD Plan as above.  If patient  is unable to make her noon appointment at outpatient hemodialysis will discuss possibility of inpatient hemodialysis and discharge home later today.  Hypoglycemic episode -Continue insulin 4 units nightly. -Encouraged recording twice daily sugar checks to take to PCP as outpt    Orthostatic hypotension Positive orthostatics possibly the setting of poor oral intake and medications.  Home meds include $RemoveBefo'40mg'kyeZEPhLREy$  Lasix, $Remov'25mg'kruXqi$  Metoprolol tartrate BID, $RemoveBefor'30mg'klaUNazwpLus$  Isosorbide mononitrate and $RemoveBeforeD'50mg'GEARSjwgDJzIsQ$  Hydralazine TID.  -Decreased to 12.$RemoveBeforeDE'5mg'jiQTbeovgqXvZxG$  Metoprolol tartrate BID   Unstable angina No recurrent symptoms during admission. - continue aspirin -$RemoveBeforeDEI'40mg'wtsRrhHkifBoxEfv$  Pravastatin -Meorprolol as above -$RemoveB'30mg'MebDegKW$  Imdur -$Remov'50mg'kwYfsG$  Hydralazine TID     Prior to Admission Living Arrangement: Motel with granddaughter Anticipated Discharge Location: [place] with granddaughter Barriers to Discharge: Transportation  Dispo: Anticipated discharge in approximately 0-1 day(s).   Rondia Higginbotham N, DO 11/20/2020, 11:08 AM Pager: [phone] After 5pm on weekdays and 1pm on weekends: On Call pager [phone]

## 2020-11-20 NOTE — TOC Progression Note (Addendum)
Transition of Care [facility]) - Progression Note    Patient Details  Name: Ann [name] MRN: [id number] Date of Birth: [birth year]  Transition of Care [facility]) CM/SW [place], [place] Phone Number: 11/20/2020, 9:42 AM  Clinical Narrative:    CSW received a consult from [facility], Medical Student concerning transportation for pt to dialysis from hospital.   B and E contacted [facility] to see if they could provide transportation to pt's residence.  [facility] does not have a Education officer, museum on the weekends who could possibly set up transportation for pt. CSW left taxi voucher with nurse secretary for disposition after speaking with pt who confirmed that she had transportation from dialysis to residence.  9:47am: CSW was informed by RN Denyse Amass that pt is not able to be transported in cab to [facility]. Pt will need assisance from taxi and assistance inside of building. CSW attempted to contact pt's daughter Ann [name] @ [phone] and nephew Ann [name] @ [phone] ( phone # disconnected).   9:56 am: CSW contacted [place] to update him on the  transportation information. CSW waiting a call back from [employer], Medical Student to determine transportation needs for disposition. 10:42am : CSW received a phone call from Ann [name]. Who will contact [facility] to see if they can assist pt from taxi into the center for dialysis.  According to [facility]. [facility] will assist pt after the driver comes inside to let them know pt is there.  The latest they will accept pt is 1:00pm for dialysis.  CSW left a taxi voucher with Retail banker for transportation from [facility] to [place].  CSW confirmed address with pt's granddaughter. 10:50am: CSW spoke with pt's granddaughter by phone in pt's room.  Pt's granddaughter will be at the hotel on [address] to receive  pt from dialysis. 11:23am:  CSW left vm for [facility] Supervisor Ann [name] concerning disposition of pt. 12:06pm: CSW spoke with [facility] Supervisor Ann [name].  CSW will contact pt's granddaughter to make sure pt arrived at residence after dialysis. 12:33pm: CSW called [facility] to check on how long is pt's treatment and to make sure pt has taxi voucher to residence. Pt has taxi voucher and has shown it to RN at dialysis.  12:42pm: CSW called pt's granddaughter.  CSW asked pt's granddaughter to give her a phone call when pt arrives at residence.  Pt's granddaughter will contact CSW when pt arrives.  Expected Discharge Plan: [place] Barriers to Discharge: No Barriers Identified  Expected Discharge Plan and Services Expected Discharge Plan: [place]   Discharge Planning Services: CM Consult   Living arrangements for the past 2 months: Single Family Home Expected Discharge Date: 11/19/20                         HH Arranged: PT HH Agency: [place] Date [place]: 11/19/20 Time [place]: 1510 Representative spoke with at [place]: [place] ([place]) Interventions    Readmission Risk Interventions Readmission Risk Prevention Plan 02/23/2019  Transportation Screening Complete  PCP or Specialist Appt within 5-7 Days Complete  Home Care Screening Not Complete  Home Care Screening Not Completed Comments NA  Medication Review (RN CM) Not Complete  Med Review comments NA  Some recent data might be hidden

## 2021-02-14 ENCOUNTER — Encounter (HOSPITAL_COMMUNITY): Payer: Self-pay | Admitting: Emergency Medicine

## 2021-02-14 ENCOUNTER — Emergency Department (HOSPITAL_COMMUNITY): Payer: Medicare Other

## 2021-02-14 ENCOUNTER — Emergency Department (HOSPITAL_COMMUNITY)
Admission: EM | Admit: 2021-02-14 | Discharge: 2021-02-15 | Disposition: A | Discharge status: Home / Self Care | Payer: Medicare Other | Attending: Emergency Medicine | Admitting: Emergency Medicine

## 2021-02-14 DIAGNOSIS — Z5321 Procedure and treatment not carried out due to patient leaving prior to being seen by health care provider: Secondary | ICD-10-CM | POA: Insufficient documentation

## 2021-02-14 DIAGNOSIS — Z992 Dependence on renal dialysis: Secondary | ICD-10-CM | POA: Diagnosis not present

## 2021-02-14 DIAGNOSIS — R079 Chest pain, unspecified: Secondary | ICD-10-CM | POA: Insufficient documentation

## 2021-02-14 LAB — COMPREHENSIVE METABOLIC PANEL
ALT: 14 U/L (ref 0–44)
AST: 16 U/L (ref 15–41)
Albumin: 3.4 g/dL — ABNORMAL LOW (ref 3.5–5.0)
Alkaline Phosphatase: 64 U/L (ref 38–126)
Anion gap: 8 (ref 5–15)
BUN: 7 mg/dL — ABNORMAL LOW (ref 8–23)
CO2: 27 mmol/L (ref 22–32)
Calcium: 8.7 mg/dL — ABNORMAL LOW (ref 8.9–10.3)
Chloride: 103 mmol/L (ref 98–111)
Creatinine, Ser: 0.85 mg/dL (ref 0.44–1.00)
GFR, Estimated: >60 mL/min (ref >60)
Glucose, Bld: 106 mg/dL — ABNORMAL HIGH (ref 70–99)
Potassium: 3.9 mmol/L (ref 3.5–5.1)
Sodium: 138 mmol/L (ref 135–145)
Total Bilirubin: 0.4 mg/dL (ref 0.3–1.2)
Total Protein: 7.3 g/dL (ref 6.5–8.1)

## 2021-02-14 LAB — MAGNESIUM: Magnesium: 1.9 mg/dL (ref 1.7–2.4)

## 2021-02-14 NOTE — ED Triage Notes (Signed)
Pt arrives via EMS from dialysis with intermittent CP since Friday. Dialysis did not do any tx today and pt missed Mon & Wed. Today the pain is on the left sided, worse on palpitation and with breathing.

## 2021-02-14 NOTE — ED Notes (Signed)
Patient left on own accord °

## 2021-02-14 NOTE — ED Notes (Signed)
Called pt for vitals, no response.

## 2021-03-29 ENCOUNTER — Ambulatory Visit: Payer: Medicare Other | Admitting: Cardiovascular Disease

## 2021-05-16 ENCOUNTER — Other Ambulatory Visit (HOSPITAL_COMMUNITY): Payer: Self-pay | Admitting: Student

## 2021-05-16 DIAGNOSIS — E119 Type 2 diabetes mellitus without complications: Secondary | ICD-10-CM

## 2021-05-16 DIAGNOSIS — E785 Hyperlipidemia, unspecified: Secondary | ICD-10-CM

## 2021-05-20 ENCOUNTER — Encounter (HOSPITAL_COMMUNITY): Payer: Self-pay | Admitting: Student

## 2021-05-25 ENCOUNTER — Emergency Department (HOSPITAL_COMMUNITY)
Admission: EM | Admit: 2021-05-25 | Discharge: 2021-05-25 | Disposition: A | Discharge status: Home / Self Care | Payer: Medicare Other | Attending: Emergency Medicine | Admitting: Emergency Medicine

## 2021-05-25 ENCOUNTER — Other Ambulatory Visit: Payer: Self-pay | Admitting: Student

## 2021-05-25 ENCOUNTER — Emergency Department (HOSPITAL_COMMUNITY): Payer: Medicare Other

## 2021-05-25 DIAGNOSIS — J45909 Unspecified asthma, uncomplicated: Secondary | ICD-10-CM | POA: Diagnosis not present

## 2021-05-25 DIAGNOSIS — I120 Hypertensive chronic kidney disease with stage 5 chronic kidney disease or end stage renal disease: Secondary | ICD-10-CM | POA: Diagnosis not present

## 2021-05-25 DIAGNOSIS — Z992 Dependence on renal dialysis: Secondary | ICD-10-CM | POA: Insufficient documentation

## 2021-05-25 DIAGNOSIS — R55 Syncope and collapse: Secondary | ICD-10-CM | POA: Diagnosis not present

## 2021-05-25 DIAGNOSIS — Z1231 Encounter for screening mammogram for malignant neoplasm of breast: Secondary | ICD-10-CM

## 2021-05-25 DIAGNOSIS — I2510 Atherosclerotic heart disease of native coronary artery without angina pectoris: Secondary | ICD-10-CM | POA: Diagnosis not present

## 2021-05-25 DIAGNOSIS — E2839 Other primary ovarian failure: Secondary | ICD-10-CM

## 2021-05-25 DIAGNOSIS — E1122 Type 2 diabetes mellitus with diabetic chronic kidney disease: Secondary | ICD-10-CM | POA: Diagnosis not present

## 2021-05-25 DIAGNOSIS — N186 End stage renal disease: Secondary | ICD-10-CM | POA: Insufficient documentation

## 2021-05-25 LAB — CBC WITH DIFFERENTIAL/PLATELET
Abs Immature Granulocytes: 0.04 10*3/uL (ref 0.00–0.07)
Basophils Absolute: 0 10*3/uL (ref 0.0–0.1)
Basophils Relative: 0 %
Eosinophils Absolute: 0 10*3/uL (ref 0.0–0.5)
Eosinophils Relative: 1 %
HCT: 36.1 % (ref 36.0–46.0)
Hemoglobin: 11 g/dL — ABNORMAL LOW (ref 12.0–15.0)
Immature Granulocytes: 1 %
Lymphocytes Relative: 37 %
Lymphs Abs: 2.3 10*3/uL (ref 0.7–4.0)
MCH: 28.4 pg (ref 26.0–34.0)
MCHC: 30.5 g/dL (ref 30.0–36.0)
MCV: 93 fL (ref 80.0–100.0)
Monocytes Absolute: 0.5 10*3/uL (ref 0.1–1.0)
Monocytes Relative: 8 %
Neutro Abs: 3.4 10*3/uL (ref 1.7–7.7)
Neutrophils Relative %: 53 %
Platelets: 108 10*3/uL — ABNORMAL LOW (ref 150–400)
RBC: 3.88 MIL/uL (ref 3.87–5.11)
RDW: 14 % (ref 11.5–15.5)
WBC: 6.3 10*3/uL (ref 4.0–10.5)
nRBC: 0 % (ref 0.0–0.2)

## 2021-05-25 LAB — COMPREHENSIVE METABOLIC PANEL
ALT: 14 U/L (ref 0–44)
AST: 24 U/L (ref 15–41)
Albumin: 3.4 g/dL — ABNORMAL LOW (ref 3.5–5.0)
Alkaline Phosphatase: 72 U/L (ref 38–126)
Anion gap: 12 (ref 5–15)
BUN: 22 mg/dL (ref 8–23)
CO2: 24 mmol/L (ref 22–32)
Calcium: 8.8 mg/dL — ABNORMAL LOW (ref 8.9–10.3)
Chloride: 103 mmol/L (ref 98–111)
Creatinine, Ser: 2.95 mg/dL — ABNORMAL HIGH (ref 0.44–1.00)
GFR, Estimated: 16 mL/min — ABNORMAL LOW (ref >60)
Glucose, Bld: 178 mg/dL — ABNORMAL HIGH (ref 70–99)
Potassium: 3.4 mmol/L — ABNORMAL LOW (ref 3.5–5.1)
Sodium: 139 mmol/L (ref 135–145)
Total Bilirubin: 0.7 mg/dL (ref 0.3–1.2)
Total Protein: 6.5 g/dL (ref 6.5–8.1)

## 2021-05-25 LAB — TROPONIN I (HIGH SENSITIVITY): Troponin I (High Sensitivity): 12 ng/L (ref <18)

## 2021-05-25 MED ORDER — HYDRALAZINE HCL 25 MG PO TABS
50.0000 mg | ORAL_TABLET | Freq: Once | ORAL | Status: AC
  Administered 2021-05-25: 50 mg via ORAL
  Filled 2021-05-25: qty 2

## 2021-05-25 MED ORDER — SODIUM CHLORIDE 0.9 % IV BOLUS
500.0000 mL | Freq: Once | INTRAVENOUS | Status: AC
  Administered 2021-05-25: 500 mL via INTRAVENOUS

## 2021-05-25 NOTE — ED Notes (Signed)
Pt's BP elevated. PA made aware.

## 2021-05-25 NOTE — Discharge Instructions (Addendum)
Please return to the ED with any new or worsening symptoms such as shortness of breath, chest pain, dizziness, loss of consciousness Please make all efforts to make it to your dialysis appointments If you begin to have heart palpitations, chest pains please report back to the ED

## 2021-05-25 NOTE — ED Provider Notes (Incomplete)
°  Physical Exam  BP (!) 164/66    Pulse 75    Temp 97.6 F (36.4 C) (Oral)    Resp 14    SpO2 100%   Physical Exam  Procedures  Procedures  ED Course / MDM   Clinical Course as of 05/25/21 2252  Wed May 25, 2021  1627 [facility] syncope rule LOW RISK [SG]    Clinical Course User Index [SG] Jeanell Sparrow, DO   Medical Decision Making Amount and/or Complexity of Data Reviewed Labs: ordered. Radiology: ordered.  Risk Prescription drug management.   ***

## 2021-05-25 NOTE — ED Notes (Signed)
Pt's BP above 200. MD Schlossman made aware. Hydralazine ordered.

## 2021-05-25 NOTE — ED Triage Notes (Addendum)
Pt BIB [facility] from a dialysis center d/t a syncopal episode she had while receiving her treatment.  Pt only received 2 hrs of treatment.Pt reported chest pain this morning prior treatment. Pt missed her Monday dialysis this week. A&O X4  180/90 75 HR 96% RA CBG 161

## 2021-05-25 NOTE — ED Provider Notes (Addendum)
[facility] EMERGENCY DEPARTMENT Provider Note   CSN: [id number] Arrival date & time: 05/25/21  1539     History  No chief complaint on file.   Ann [name] is a 71 y.o. female with history of asthma, CAD, end-stage renal disease on dialysis.  Patient is a dialysis patient, goes Monday Wednesday Friday.  Patient states earlier today she was at her dialysis appointment when she had sudden onset of sweating and then next and she remembers she woke up with nurse tech standing over her.  Patient states that the nurse tech advised her she had lost consciousness for less than 1 minute.  Patient was sitting in dialysis chair at this time, she did not fall to the ground.  Patient reports that she had gotten through 2 hours of her session today, had missed her appointment on Monday.  Patient reports that she has had syncopal events during dialysis before due to having "too much fluid drawn off of me".  On examination, patient not complaining of any symptoms.  Patient denies shortness of breath, nausea, vomiting, fevers, abdominal pain, diarrhea, chest pain.  HPI     Home Medications Prior to Admission medications   Medication Sig Start Date End Date Taking? Authorizing Provider  ACCU-CHEK FASTCLIX LANCETS MISC USE AS DIRECTED TO CHECK BLOOD SUGAR FOUR TIMES A DAY(BEFORE MEALS AND AT BEDTIME). 10/14/12   Sid Falcon, MD  ACCU-CHEK SMARTVIEW test strip CHECK BLOOD SUGAR BEFORE MEALS AND AT BEDTIME 10/14/12   Sid Falcon, MD  acetaminophen (TYLENOL) 325 MG tablet Take 650 mg by mouth every 4 (four) hours as needed for moderate pain.    [provider]  albuterol (VENTOLIN HFA) 108 (90 Base) MCG/ACT inhaler Inhale 2 puffs into the lungs every 4 (four) hours as needed for wheezing or shortness of breath. 05/04/20   Medina-Vargas, Monina C, NP  amLODipine (NORVASC) 10 MG tablet Take 10 mg by mouth in the morning.    [provider]  apixaban (ELIQUIS) 5 MG TABS  tablet Take 1 tablet (5 mg total) by mouth 2 (two) times daily. 05/04/20   Medina-Vargas, Monina C, NP  aspirin EC 81 MG tablet Take 1 tablet (81 mg total) by mouth daily. Swallow whole. 11/19/20 11/19/21  Rehman, Areeg N, DO  DULoxetine (CYMBALTA) 60 MG capsule Take 1 capsule (60 mg total) by mouth in the morning. 11/19/20   Rehman, Areeg N, DO  hydrALAZINE (APRESOLINE) 50 MG tablet Take 1 tablet (50 mg total) by mouth 3 (three) times daily. 05/04/20   Medina-Vargas, Monina C, NP  HYDROcodone-acetaminophen (NORCO/VICODIN) 5-325 MG tablet Take 1 tablet by mouth every 4 (four) hours as needed for moderate pain. 10/14/20 10/14/21  Setzer, Edman Circle, PA-C  Insulin Pen Needle (EASY TOUCH PEN NEEDLES) 31G X 8 MM MISC Use to inject insulin as instructed. 02/02/12   Bartholomew Crews, MD  iron sucrose in sodium chloride 0.9 % 100 mL Iron Sucrose (Venofer) 09/01/20 08/24/21  [provider]  isosorbide mononitrate (IMDUR) 30 MG 24 hr tablet Take 30 mg by mouth in the morning.    [provider]  linagliptin (TRADJENTA) 5 MG TABS tablet Take 5 mg by mouth in the morning.    [provider]  Methoxy PEG-Epoetin Beta (MIRCERA IJ) Mircera 07/12/20 09/05/21  [provider]  metoprolol tartrate (LOPRESSOR) 25 MG tablet Take 0.5 tablets (12.5 mg total) by mouth in the morning and at bedtime. 11/19/20   Rehman, Areeg N, DO  pantoprazole (PROTONIX) 20 MG tablet Take 1 tablet (20 mg total) by mouth in the morning. 11/19/20   Rehman, Areeg N, DO  pravastatin (PRAVACHOL) 40 MG tablet Take 1 tablet (40 mg total) by mouth in the morning. 11/20/20   Orvis Brill, MD  rOPINIRole (REQUIP) 0.25 MG tablet SMARTSIG:1 Tablet(s) By Mouth Every Evening 10/21/20   [provider]  TOUJEO SOLOSTAR 300 UNIT/ML Solostar Pen Inject 4 Units into the skin at bedtime. 11/19/20   Rehman, Areeg N, DO  atorvastatin (LIPITOR) 40 MG tablet Take 1 tablet (40 mg total) by mouth daily. 11/19/20 11/19/20   Orvis Brill, MD      Allergies    Penicillins, Codeine, and Metronidazole    Review of Systems   Review of Systems  Constitutional:  Negative for chills and fever.  Respiratory:  Negative for shortness of breath.   Cardiovascular:  Negative for chest pain.  Gastrointestinal:  Negative for abdominal pain, diarrhea, nausea and vomiting.  Neurological:  Positive for syncope.  All other systems reviewed and are negative.  Physical Exam Updated Vital Signs BP 140/69    Pulse 75    Temp 97.6 F (36.4 C) (Oral)    Resp 16    SpO2 99%  Physical Exam Vitals and nursing note reviewed.  Constitutional:      General: She is not in acute distress.    Appearance: She is not ill-appearing, toxic-appearing or diaphoretic.  HENT:     Head: Normocephalic and atraumatic.     Nose: Nose normal.  Eyes:     Extraocular Movements: Extraocular movements intact.     Pupils: Pupils are equal, round, and reactive to light.  Cardiovascular:     Rate and Rhythm: Normal rate and regular rhythm.  Pulmonary:     Effort: Pulmonary effort is normal.     Breath sounds: Normal breath sounds. No stridor. No wheezing, rhonchi or rales.  Abdominal:     General: Abdomen is flat.     Palpations: Abdomen is soft.     Tenderness: There is no abdominal tenderness.  Musculoskeletal:     Cervical back: Normal range of motion and neck supple.  Skin:    General: Skin is warm and dry.     Capillary Refill: Capillary refill takes less than 2 seconds.  Neurological:     Mental Status: She is alert and oriented to person, place, and time.     GCS: GCS eye subscore is 4. GCS verbal subscore is 5. GCS motor subscore is 6.     Cranial Nerves: Cranial nerves 2-12 are intact. No cranial nerve deficit.     Sensory: Sensation is intact. No sensory deficit.     Motor: Motor function is intact.     Coordination: Coordination is intact.    ED Results / Procedures / Treatments   Labs (all labs ordered are listed,  but only abnormal results are displayed) Labs Reviewed  CBC WITH DIFFERENTIAL/PLATELET - Abnormal; Notable for the following components:      Result Value   Hemoglobin 11.0 (*)    Platelets 108 (*)    All other components within normal limits  COMPREHENSIVE METABOLIC PANEL - Abnormal; Notable for the following components:   Potassium 3.4 (*)    Glucose, Bld 178 (*)    Creatinine, Ser 2.95 (*)    Calcium 8.8 (*)    Albumin 3.4 (*)    GFR, Estimated 16 (*)    All other components within normal limits  EKG EKG Interpretation  Date/Time:  Wednesday May 25 2021 15:52:54 EST Ventricular Rate:  77 PR Interval:  157 QRS Duration: 107 QT Interval:  454 QTC Calculation: 514 R Axis:   44 Text Interpretation: Sinus rhythm RSR' in V1 or V2, right VCD or RVH similar to prior Confirmed by Wynona Dove ([id number]) on 05/25/2021 4:26:24 PM  Radiology DG Chest Portable 1 View  Result Date: 05/25/2021 CLINICAL DATA:  loc EXAM: PORTABLE CHEST 1 VIEW COMPARISON:  Chest x-ray 02/14/2021 FINDINGS: The heart and mediastinal contours are unchanged. Aortic calcification. No focal consolidation. No pulmonary edema. No pleural effusion. No pneumothorax. No acute osseous abnormality. Bilateral shoulder degenerative changes. IMPRESSION: No active disease. Electronically Signed   By: Iven Finn M.D.   On: 05/25/2021 17:25    Procedures Procedures    Medications Ordered in ED Medications  sodium chloride 0.9 % bolus 500 mL (500 mLs Intravenous New Bag/Given 05/25/21 1745)    ED Course/ Medical Decision Making/ A&P Clinical Course as of 05/25/21 1843  Wed May 25, 2021  1627 [place] syncope rule LOW RISK [SG]    Clinical Course User Index [SG] Jeanell Sparrow, DO                           Medical Decision Making Amount and/or Complexity of Data Reviewed Labs: ordered. Radiology: ordered.   71 year old female presents after syncopal event while on dialysis.  On examination, patient is in  no apparent distress, not hypoxic, nontachycardic, clear lung sounds bilaterally, no JVD, no lower extremity swelling, no back pain.  Patient work-up utilizing following labs: CBC, CMP CBC: Shows slightly decreased hemoglobin to 11, this is in line with patient baseline CMP: Shows slightly decreased potassium to 3.4 EKG: Normal sinus rhythm Chest x-ray: No cardiopulmonary disease noted.  No infiltrates, no consolidations, no pneumothorax.  Patient given half liter of saline, states that she feels better afterwards.  Patient ambulated without difficulty.  Per [facility] syncope rule, patient is low risk.  At the time of my end of shift, patient was signed out to Dr. Billy Fischer for further evaluation and management. I feel this patient is appropriate for discharge at this time, however Dr. Billy Fischer wanted to interview the patient prior to discharging her out of an abundance of caution. Patient is stable at time of handoff.    Final Clinical Impression(s) / ED Diagnoses Final diagnoses:  Syncope, unspecified syncope type    Rx / DC Orders ED Discharge Orders     None             Lawana Chambers 05/25/21 1903    Gareth Morgan, MD 05/26/21 2153

## 2021-05-25 NOTE — Progress Notes (Signed)
Patient's dialysis graft deaccessed and manual pressure held x 30 minutes.  No bleeding when pressure removed.  Bruit and thrill present throughout time pressure held.  Once hemostasis achieved, gauze bandage applied.  Chloe, RN aware to continue to monitor for bleeding and hold pressure if this does occur.  Patient has call bell and will notify RN if bleeding is noted.

## 2021-08-28 ENCOUNTER — Observation Stay (HOSPITAL_COMMUNITY)
Admission: EM | Admit: 2021-08-28 | Discharge: 2021-08-31 | Disposition: A | Discharge status: Skilled Nursing Facility | Payer: 59 | Attending: Family Medicine | Admitting: Family Medicine

## 2021-08-28 ENCOUNTER — Encounter (HOSPITAL_COMMUNITY): Payer: Self-pay

## 2021-08-28 ENCOUNTER — Other Ambulatory Visit: Payer: Self-pay

## 2021-08-28 ENCOUNTER — Emergency Department (HOSPITAL_COMMUNITY): Payer: 59

## 2021-08-28 DIAGNOSIS — E1122 Type 2 diabetes mellitus with diabetic chronic kidney disease: Secondary | ICD-10-CM | POA: Insufficient documentation

## 2021-08-28 DIAGNOSIS — G4733 Obstructive sleep apnea (adult) (pediatric): Secondary | ICD-10-CM

## 2021-08-28 DIAGNOSIS — I2510 Atherosclerotic heart disease of native coronary artery without angina pectoris: Secondary | ICD-10-CM | POA: Diagnosis not present

## 2021-08-28 DIAGNOSIS — Z955 Presence of coronary angioplasty implant and graft: Secondary | ICD-10-CM | POA: Diagnosis not present

## 2021-08-28 DIAGNOSIS — Z96653 Presence of artificial knee joint, bilateral: Secondary | ICD-10-CM | POA: Diagnosis not present

## 2021-08-28 DIAGNOSIS — Z794 Long term (current) use of insulin: Secondary | ICD-10-CM | POA: Insufficient documentation

## 2021-08-28 DIAGNOSIS — Z992 Dependence on renal dialysis: Secondary | ICD-10-CM | POA: Insufficient documentation

## 2021-08-28 DIAGNOSIS — I120 Hypertensive chronic kidney disease with stage 5 chronic kidney disease or end stage renal disease: Secondary | ICD-10-CM | POA: Diagnosis not present

## 2021-08-28 DIAGNOSIS — W1839XA Other fall on same level, initial encounter: Secondary | ICD-10-CM | POA: Diagnosis not present

## 2021-08-28 DIAGNOSIS — Z79899 Other long term (current) drug therapy: Secondary | ICD-10-CM | POA: Diagnosis not present

## 2021-08-28 DIAGNOSIS — R531 Weakness: Secondary | ICD-10-CM | POA: Diagnosis present

## 2021-08-28 DIAGNOSIS — W19XXXA Unspecified fall, initial encounter: Secondary | ICD-10-CM

## 2021-08-28 DIAGNOSIS — Z7982 Long term (current) use of aspirin: Secondary | ICD-10-CM | POA: Insufficient documentation

## 2021-08-28 DIAGNOSIS — J45909 Unspecified asthma, uncomplicated: Secondary | ICD-10-CM | POA: Diagnosis not present

## 2021-08-28 DIAGNOSIS — N186 End stage renal disease: Secondary | ICD-10-CM | POA: Insufficient documentation

## 2021-08-28 DIAGNOSIS — Z8616 Personal history of COVID-19: Secondary | ICD-10-CM | POA: Insufficient documentation

## 2021-08-28 DIAGNOSIS — Z7901 Long term (current) use of anticoagulants: Secondary | ICD-10-CM | POA: Diagnosis not present

## 2021-08-28 DIAGNOSIS — Z86718 Personal history of other venous thrombosis and embolism: Secondary | ICD-10-CM | POA: Diagnosis not present

## 2021-08-28 DIAGNOSIS — R5381 Other malaise: Secondary | ICD-10-CM | POA: Diagnosis not present

## 2021-08-28 DIAGNOSIS — I10 Essential (primary) hypertension: Secondary | ICD-10-CM

## 2021-08-28 DIAGNOSIS — Z7984 Long term (current) use of oral hypoglycemic drugs: Secondary | ICD-10-CM | POA: Insufficient documentation

## 2021-08-28 LAB — CBC WITH DIFFERENTIAL/PLATELET
Abs Immature Granulocytes: 0.01 10*3/uL (ref 0.00–0.07)
Basophils Absolute: 0 10*3/uL (ref 0.0–0.1)
Basophils Relative: 0 %
Eosinophils Absolute: 0.1 10*3/uL (ref 0.0–0.5)
Eosinophils Relative: 2 %
HCT: 40.1 % (ref 36.0–46.0)
Hemoglobin: 12.8 g/dL (ref 12.0–15.0)
Immature Granulocytes: 0 %
Lymphocytes Relative: 35 %
Lymphs Abs: 1.9 10*3/uL (ref 0.7–4.0)
MCH: 29.5 pg (ref 26.0–34.0)
MCHC: 31.9 g/dL (ref 30.0–36.0)
MCV: 92.4 fL (ref 80.0–100.0)
Monocytes Absolute: 0.4 10*3/uL (ref 0.1–1.0)
Monocytes Relative: 7 %
Neutro Abs: 2.9 10*3/uL (ref 1.7–7.7)
Neutrophils Relative %: 56 %
Platelets: 172 10*3/uL (ref 150–400)
RBC: 4.34 MIL/uL (ref 3.87–5.11)
RDW: 14.7 % (ref 11.5–15.5)
WBC: 5.2 10*3/uL (ref 4.0–10.5)
nRBC: 0 % (ref 0.0–0.2)

## 2021-08-28 LAB — AMMONIA: Ammonia: 19 umol/L (ref 9–35)

## 2021-08-28 LAB — CBG MONITORING, ED
Glucose-Capillary: 94 mg/dL (ref 70–99)
Glucose-Capillary: 88 mg/dL (ref 70–99)

## 2021-08-28 LAB — BASIC METABOLIC PANEL
Anion gap: 11 (ref 5–15)
BUN: 35 mg/dL — ABNORMAL HIGH (ref 8–23)
CO2: 20 mmol/L — ABNORMAL LOW (ref 22–32)
Calcium: 9.5 mg/dL (ref 8.9–10.3)
Chloride: 108 mmol/L (ref 98–111)
Creatinine, Ser: 4.93 mg/dL — ABNORMAL HIGH (ref 0.44–1.00)
GFR, Estimated: 9 mL/min — ABNORMAL LOW (ref >60)
Glucose, Bld: 109 mg/dL — ABNORMAL HIGH (ref 70–99)
Potassium: 4.7 mmol/L (ref 3.5–5.1)
Sodium: 139 mmol/L (ref 135–145)

## 2021-08-28 LAB — CK: Total CK: 41 U/L (ref 38–234)

## 2021-08-28 LAB — TSH: TSH: 1.418 u[IU]/mL (ref 0.350–4.500)

## 2021-08-28 LAB — MAGNESIUM: Magnesium: 1.7 mg/dL (ref 1.7–2.4)

## 2021-08-28 LAB — VITAMIN B12: Vitamin B-12: 275 pg/mL (ref 180–914)

## 2021-08-28 MED ORDER — ISOSORBIDE MONONITRATE ER 30 MG PO TB24
30.0000 mg | ORAL_TABLET | Freq: Every morning | ORAL | Status: DC
  Administered 2021-08-29 – 2021-08-31 (×3): 30 mg via ORAL
  Filled 2021-08-28 (×3): qty 1

## 2021-08-28 MED ORDER — APIXABAN 5 MG PO TABS
5.0000 mg | ORAL_TABLET | Freq: Two times a day (BID) | ORAL | Status: DC
  Administered 2021-08-28 – 2021-08-31 (×6): 5 mg via ORAL
  Filled 2021-08-28 (×6): qty 1

## 2021-08-28 MED ORDER — ACETAMINOPHEN 325 MG PO TABS: 650.0000 mg | ORAL_TABLET | Freq: Four times a day (QID) | ORAL | Status: DC | PRN

## 2021-08-28 MED ORDER — CHLORHEXIDINE GLUCONATE CLOTH 2 % EX PADS
6.0000 | MEDICATED_PAD | Freq: Every day | CUTANEOUS | Status: DC
  Administered 2021-08-29 – 2021-08-31 (×3): 6 via TOPICAL

## 2021-08-28 MED ORDER — DULOXETINE HCL 60 MG PO CPEP
60.0000 mg | ORAL_CAPSULE | Freq: Every morning | ORAL | Status: DC
  Administered 2021-08-29 – 2021-08-31 (×3): 60 mg via ORAL
  Filled 2021-08-28 (×4): qty 1

## 2021-08-28 MED ORDER — ROPINIROLE HCL 0.25 MG PO TABS
0.2500 mg | ORAL_TABLET | Freq: Every day | ORAL | Status: DC
  Administered 2021-08-29 – 2021-08-31 (×3): 0.25 mg via ORAL
  Filled 2021-08-28 (×4): qty 1

## 2021-08-28 MED ORDER — PANTOPRAZOLE SODIUM 20 MG PO TBEC
20.0000 mg | DELAYED_RELEASE_TABLET | Freq: Every morning | ORAL | Status: DC
  Administered 2021-08-29 – 2021-08-31 (×3): 20 mg via ORAL
  Filled 2021-08-28 (×4): qty 1

## 2021-08-28 MED ORDER — ACETAMINOPHEN 650 MG RE SUPP: 650.0000 mg | Freq: Four times a day (QID) | RECTAL | Status: DC | PRN

## 2021-08-28 MED ORDER — HYDROCODONE-ACETAMINOPHEN 5-325 MG PO TABS
1.0000 | ORAL_TABLET | Freq: Once | ORAL | Status: AC
  Administered 2021-08-28: 1 via ORAL
  Filled 2021-08-28: qty 1

## 2021-08-28 MED ORDER — ALBUTEROL SULFATE (2.5 MG/3ML) 0.083% IN NEBU: 3.0000 mL | INHALATION_SOLUTION | RESPIRATORY_TRACT | Status: DC | PRN

## 2021-08-28 MED ORDER — HYDRALAZINE HCL 50 MG PO TABS
50.0000 mg | ORAL_TABLET | Freq: Three times a day (TID) | ORAL | Status: DC
  Administered 2021-08-28 – 2021-08-31 (×7): 50 mg via ORAL
  Filled 2021-08-28 (×4): qty 1
  Filled 2021-08-28: qty 2
  Filled 2021-08-28 (×2): qty 1

## 2021-08-28 MED ORDER — METOPROLOL TARTRATE 12.5 MG HALF TABLET
12.5000 mg | ORAL_TABLET | Freq: Two times a day (BID) | ORAL | Status: DC
  Administered 2021-08-28 – 2021-08-31 (×6): 12.5 mg via ORAL
  Filled 2021-08-28 (×5): qty 1

## 2021-08-28 MED ORDER — AMLODIPINE BESYLATE 10 MG PO TABS
10.0000 mg | ORAL_TABLET | Freq: Every morning | ORAL | Status: DC
Start: 2021-08-29 — End: 2021-09-01
  Administered 2021-08-29 – 2021-08-31 (×3): 10 mg via ORAL
  Filled 2021-08-28 (×3): qty 1

## 2021-08-28 MED ORDER — DOXERCALCIFEROL 4 MCG/2ML IV SOLN
1.0000 ug | INTRAVENOUS | Status: DC
Start: 2021-08-31 — End: 2021-09-01
  Administered 2021-08-31: 1 ug via INTRAVENOUS
  Filled 2021-08-28 (×2): qty 2

## 2021-08-28 MED ORDER — ONDANSETRON HCL 4 MG PO TABS: 4.0000 mg | ORAL_TABLET | Freq: Four times a day (QID) | ORAL | Status: DC | PRN

## 2021-08-28 MED ORDER — METOPROLOL TARTRATE 25 MG PO TABS: 12.5000 mg | ORAL_TABLET | Freq: Two times a day (BID) | ORAL | Status: DC

## 2021-08-28 MED ORDER — SENNOSIDES-DOCUSATE SODIUM 8.6-50 MG PO TABS: 1.0000 | ORAL_TABLET | Freq: Every evening | ORAL | Status: DC | PRN

## 2021-08-28 MED ORDER — ONDANSETRON HCL 4 MG/2ML IJ SOLN
4.0000 mg | Freq: Four times a day (QID) | INTRAMUSCULAR | Status: DC | PRN
Start: 2021-08-28 — End: 2021-09-01

## 2021-08-28 MED ORDER — OXYCODONE HCL 5 MG PO TABS
5.0000 mg | ORAL_TABLET | Freq: Four times a day (QID) | ORAL | Status: DC | PRN
  Filled 2021-08-28: qty 1

## 2021-08-28 MED ORDER — INSULIN ASPART 100 UNIT/ML IJ SOLN
0.0000 [IU] | Freq: Three times a day (TID) | INTRAMUSCULAR | Status: DC
  Administered 2021-08-30 – 2021-08-31 (×3): 1 [IU] via SUBCUTANEOUS

## 2021-08-28 MED ORDER — PRAVASTATIN SODIUM 40 MG PO TABS
40.0000 mg | ORAL_TABLET | Freq: Every morning | ORAL | Status: DC
  Administered 2021-08-29 – 2021-08-31 (×3): 40 mg via ORAL
  Filled 2021-08-28 (×3): qty 1

## 2021-08-28 NOTE — ED Triage Notes (Signed)
Patient brought in via ems from home with grandfaughter. Per patient, she has not been feeling well this week and has not been to dialysis since 4/26. Since then, patient has increased falls  (x3) without injury. C/o right sided back pain ?

## 2021-08-28 NOTE — ED Provider Notes (Signed)
?[facility] ?Provider Note ? ? ?CSN: [id number] ?Arrival date & time: 08/28/21  1828 ? ?  ? ?History ? ?Chief Complaint  ?Patient presents with  ? Fall  ? ? ?Ann [name] is a 71 y.o. female. ? ? ?Fall ? ?Patient is a 71 year old female with a history of type 2 diabetes, BPPV, lumbar back pain, end-stage renal disease (M, W,F) presents to the emergency department after a fall.  Patient reports she missed dialysis since 4/26.  She did not go to dialysis because she felt weak and her lower back was hurting her.  She reports she had an episode of fall yesterday was mechanical.  She said her leg gave out.  She landed on her buttocks region.  Denies loss of consciousness or head injury.  She had another fall today which prompted her to call EMS.  She denies associated shortness of breath, chest pain, fever, abdominal pain.  She reports she has a history of sciatica and has been acting up as well.  Otherwise no other complaints. ?  ? ?Home Medications ?Prior to Admission medications   ?Medication Sig Start Date End Date Taking? Authorizing Provider  ?acetaminophen (TYLENOL) 325 MG tablet Take 650 mg by mouth every 4 (four) hours as needed for moderate pain.   Yes [provider]  ?albuterol (VENTOLIN HFA) 108 (90 Base) MCG/ACT inhaler Inhale 2 puffs into the lungs every 4 (four) hours as needed for wheezing or shortness of breath. 05/04/20  Yes Medina-Vargas, Monina C, NP  ?amLODipine (NORVASC) 10 MG tablet Take 10 mg by mouth in the morning.   Yes [provider]  ?apixaban (ELIQUIS) 5 MG TABS tablet Take 1 tablet (5 mg total) by mouth 2 (two) times daily. 05/04/20  Yes Medina-Vargas, Monina C, NP  ?DULoxetine (CYMBALTA) 60 MG capsule Take 1 capsule (60 mg total) by mouth in the morning. 11/19/20  Yes Rehman, Areeg N, DO  ?hydrALAZINE (APRESOLINE) 50 MG tablet Take 1 tablet (50 mg total) by mouth 3 (three) times daily. 05/04/20  Yes Medina-Vargas, Monina C, NP   ?HYDROcodone-acetaminophen (NORCO/VICODIN) 5-325 MG tablet Take 1 tablet by mouth every 4 (four) hours as needed for moderate pain. 10/14/20 10/14/21 Yes Setzer, Edman Circle, PA-C  ?isosorbide mononitrate (IMDUR) 30 MG 24 hr tablet Take 30 mg by mouth in the morning.   Yes [provider]  ?lidocaine-prilocaine (EMLA) cream 1 application daily as needed (port access). 04/08/21  Yes [provider]  ?linagliptin (TRADJENTA) 5 MG TABS tablet Take 5 mg by mouth in the morning.   Yes [provider]  ?loratadine (CLARITIN) 10 MG tablet Take 10 mg by mouth daily as needed for allergies. 08/23/21  Yes [provider]  ?metoprolol tartrate (LOPRESSOR) 25 MG tablet Take 0.5 tablets (12.5 mg total) by mouth in the morning and at bedtime. ?Patient taking differently: Take 25 mg by mouth daily. 11/19/20  Yes Rehman, Areeg N, DO  ?pantoprazole (PROTONIX) 20 MG tablet Take 1 tablet (20 mg total) by mouth in the morning. 11/19/20  Yes Rehman, Areeg N, DO  ?pravastatin (PRAVACHOL) 40 MG tablet Take 1 tablet (40 mg total) by mouth in the morning. 11/20/20  Yes Orvis Brill, MD  ?rOPINIRole (REQUIP) 0.25 MG tablet Take 0.25 mg by mouth daily. 10/21/20  Yes [provider]  ?sulfamethoxazole-trimethoprim (BACTRIM) 400-80 MG tablet Take 1 tablet by mouth 2 (two) times daily. 08/24/21  Yes [provider]  ?ACCU-CHEK FASTCLIX LANCETS MISC USE AS  DIRECTED TO CHECK BLOOD SUGAR FOUR TIMES A DAY(BEFORE MEALS AND AT BEDTIME). 10/14/12   Sid Falcon, MD  ?Jerold Coombe test strip CHECK BLOOD SUGAR BEFORE MEALS AND AT BEDTIME 10/14/12   Sid Falcon, MD  ?aspirin EC 81 MG tablet Take 1 tablet (81 mg total) by mouth daily. Swallow whole. ?Patient not taking: Reported on 05/25/2021 11/19/20 11/19/21  Mike Craze, DO  ?Insulin Pen Needle (EASY TOUCH PEN NEEDLES) 31G X 8 MM MISC Use to inject insulin as instructed. 02/02/12   Bartholomew Crews, MD  ?Nelva Nay SOLOSTAR 300 UNIT/ML  Solostar Pen Inject 4 Units into the skin at bedtime. ?Patient not taking: Reported on 05/25/2021 11/19/20   Rehman, Areeg N, DO  ?atorvastatin (LIPITOR) 40 MG tablet Take 1 tablet (40 mg total) by mouth daily. 11/19/20 11/19/20  Orvis Brill, MD  ?   ? ?Allergies    ?Penicillins, Codeine, and Metronidazole   ? ?Review of Systems   ?Review of Systems ? ?Physical Exam ?Updated Vital Signs ?BP (!) 179/105   Pulse 97   Temp 98.2 ?F (36.8 ?C) (Oral)   Resp (!) 22   SpO2 100%  ?Physical Exam ?Constitutional:   ?   Appearance: She is ill-appearing.  ?HENT:  ?   Nose: Nose normal.  ?   Mouth/Throat:  ?   Pharynx: Oropharynx is clear.  ?Eyes:  ?   Extraocular Movements: Extraocular movements intact.  ?   Pupils: Pupils are equal, round, and reactive to light.  ?Cardiovascular:  ?   Rate and Rhythm: Tachycardia present.  ?Pulmonary:  ?   Effort: No respiratory distress.  ?   Breath sounds: No wheezing.  ?Chest:  ?   Chest wall: No tenderness.  ?Abdominal:  ?   Tenderness: There is no guarding or rebound.  ?Musculoskeletal:     ?   General: Tenderness present. No deformity. Normal range of motion.  ?   Cervical back: Normal range of motion. No rigidity.  ?   Comments: Very minimal lower extremity tenderness.  No obvious deformity.  Distal pulses are intact.  ?Skin: ?   General: Skin is warm.  ?Neurological:  ?   General: No focal deficit present.  ?   Mental Status: She is oriented to person, place, and time.  ?   Sensory: No sensory deficit.  ?   Comments: There is tenderness in the lumbar sacral region.  Hard to palpate step-off due to body habitus.  ? ? ?ED Results / Procedures / Treatments   ?Labs ?(all labs ordered are listed, but only abnormal results are displayed) ?Labs Reviewed  ?BASIC METABOLIC PANEL - Abnormal; Notable for the following components:  ?    Result Value  ? CO2 20 (*)   ? Glucose, Bld 109 (*)   ? BUN 35 (*)   ? Creatinine, Ser 4.93 (*)   ? GFR, Estimated 9 (*)   ? All other components within  normal limits  ?CBC WITH DIFFERENTIAL/PLATELET  ?MAGNESIUM  ?CK  ?TSH  ?VITAMIN B12  ?AMMONIA  ?BASIC METABOLIC PANEL  ?CBC  ?PHOSPHORUS  ?ALBUMIN  ?CBG MONITORING, ED  ?CBG MONITORING, ED  ? ? ?EKG ?EKG Interpretation ? ?Date/Time:  Sunday Aug 28 2021 18:37:48 EDT ?Ventricular Rate:  102 ?PR Interval:  149 ?QRS Duration: 94 ?QT Interval:  367 ?QTC Calculation: 479 ?R Axis:   52 ?Text Interpretation: Sinus tachycardia Borderline repolarization abnormality Since last tracing rate faster Confirmed by Dorie Rank [phone]) on 08/28/2021  6:58:07 PM ? ?Radiology ?DG Lumbar Spine 2-3 Views ? ?Result Date: 08/28/2021 ?CLINICAL DATA:  Falls.  Pain. EXAM: LUMBAR SPINE - 2-3 VIEW COMPARISON:  None FINDINGS: L5 is not well visualized on the lateral view. Within this limitation, no fracture or traumatic malalignment identified. Facet degenerative changes are noted. Calcified atherosclerosis is identified in the abdominal aorta. IMPRESSION: 1. Limited visualization of L5 on the lateral view. No fracture or traumatic malalignment identified. 2. Facet degenerative changes. 3. Calcified atherosclerosis in the abdominal aorta. Electronically Signed   By: Dorise Bullion III M.D.   On: 08/28/2021 19:27   ? ?Procedures ?Procedures  ? ?Medications Ordered in ED ?Medications  ?Chlorhexidine Gluconate Cloth 2 % PADS 6 each (has no administration in time range)  ?amLODipine (NORVASC) tablet 10 mg (has no administration in time range)  ?hydrALAZINE (APRESOLINE) tablet 50 mg (50 mg Oral Given 08/28/21 2155)  ?isosorbide mononitrate (IMDUR) 24 hr tablet 30 mg (has no administration in time range)  ?pravastatin (PRAVACHOL) tablet 40 mg (has no administration in time range)  ?DULoxetine (CYMBALTA) DR capsule 60 mg (has no administration in time range)  ?pantoprazole (PROTONIX) EC tablet 20 mg (has no administration in time range)  ?apixaban (ELIQUIS) tablet 5 mg (5 mg Oral Given 08/28/21 2155)  ?rOPINIRole (REQUIP) tablet 0.25 mg (has no administration  in time range)  ?albuterol (PROVENTIL) (2.5 MG/3ML) 0.083% nebulizer solution 3 mL (has no administration in time range)  ?insulin aspart (novoLOG) injection 0-6 Units (has no administration in time range)  ?acetaminophen (TY

## 2021-08-28 NOTE — H&P (Signed)
?History and Physical  ? ? ?Ann [name] YPP:[id number] DOB: 26-Sep-1950 DOA: 08/28/2021 ? ?PCP: Cipriano Mile, NP  ? ?Patient coming from: Home  ? ?Chief Complaint: Weakness, falls  ? ?HPI: Ann [name] is a pleasant 71 y.o. female with medical history significant for CAD with PCI to RCA in 2010, hypertension, type 2 diabetes mellitus, OSA not on CPAP, chronic back pain, and ESRD on dialysis for a little over a year, now presenting to the emergency department for evaluation of general weakness, fatigue, and recurrent falls.  Patient reports that she has not been to dialysis since April 26 due to feeling too tired and generally weak, and noting that she is not sure that she actually needs it.  She has not noticed any acute change in vision or focal numbness or weakness, but feels tired and generally weak.  She has had some difficulty with balance but denies dizziness or falling to one side consistently.  She has felt that her legs gave out, and this has caused her to fall onto her buttock multiple times in the past 2 weeks.  She uses a walker at home.  Denies chest pain, fevers, or chills.  She had a pressure sensation in her bladder recently, was treated with Bactrim which improved her symptoms, but did not improve her general weakness and fatigue. ? ?ED Course: Upon arrival to the ED, patient is found to be afebrile and saturating well on room air with stable blood pressure.  EKG features sinus tachycardia.  Plain radiographs of the lumbar spine are negative for fracture or traumatic malalignment.  Chemistry panel notable for bicarbonate of 20 and BUN of 35.  Nephrology was consulted by the ED physician and the patient was treated with Norco. ? ?Review of Systems:  ?All other systems reviewed and apart from HPI, are negative. ? ?Past Medical History:  ?Diagnosis Date  ? Anemia 09/29/2008  ? anemia of chronic disease  ? Anginal pain ([place])   ? no recent chest pain  ? Arthritis   ? Asthma 09/29/2008  ? CAD (coronary  artery disease) 09/29/2008  ? hx  PCI - [facility]. Stress test 10/2011 nl with EF 58%. [place] card., last cath 2010  ? Chest pain 02/16/2009  ? ESRD on hemodialysis ([place])   ? MWF started HD in early 2022  ? GERD (gastroesophageal reflux disease)   ? Hyperlipidemia 10/21/2009  ? Hypertension 09/29/2008  ? Insulin dependent diabetes mellitus type IA ([place]) 09/29/2008  ? Lumbar back pain 02/16/2009  ? states pain goes down right leg  ? Myalgia 02/16/2009  ? Shortness of breath   ? with exerction  ? Shoulder pain, left 03/16/2009  ? occurs at night  ? Sleep apnea   ? no cpap  ? ? ?Past Surgical History:  ?Procedure Laterality Date  ? ABDOMINAL HYSTERECTOMY    ? APPENDECTOMY    ? AV FISTULA PLACEMENT Right 08/12/2020  ? Procedure: BRACHIOCEPHALIC ARTERIOVENOUS (AV) FISTULA CREATION RIGHT;  Surgeon: Serafina Mitchell, MD;  Location: [facility];  Service: Vascular;  Laterality: Right;  ? BASCILIC VEIN TRANSPOSITION Right 10/14/2020  ? Procedure: RIGHT ARM SECOND STAGE BRACHIOCEPHALIC FISTULA TRANSPOSITION;  Surgeon: Serafina Mitchell, MD;  Location: [facility];  Service: Vascular;  Laterality: Right;  ? CARDIAC CATHETERIZATION  2008  ? in [place], [place] stable  ? CARDIAC CATHETERIZATION  2010  ? patent stents in mid and distal RCA; high grade disease in prox & mid AV groove LCX& OM; mod disease  in prox & mid LAD normal LV FUNCTION--TREATED MEDICALLY    ? CATARACT EXTRACTION, BILATERAL    ? CHOLECYSTECTOMY    ? CORONARY ANGIOPLASTY WITH STENT PLACEMENT  2005  ? 2 vessel PCI at [facility].  ? [place]  ? EF 60%  ? IR FLUORO GUIDE CV LINE LEFT  04/21/2020  ? IR US GUIDE VASC ACCESS LEFT  04/21/2020  ? TOTAL KNEE ARTHROPLASTY  12/2011  ? Left  ? TOTAL KNEE ARTHROPLASTY  11/28/2011  ? Procedure: TOTAL KNEE ARTHROPLASTY;  Surgeon: Hessie Dibble, MD;  Location: [facility];  Service: Orthopedics;  Laterality: Right;  ? ? ?Social History:  ? reports that she has never smoked. Her smokeless tobacco use includes snuff. She  reports that she does not drink alcohol and does not use drugs. ? ?Allergies  ?Allergen Reactions  ? Penicillins Anaphylaxis  ? Codeine Nausea And Vomiting  ? Metronidazole Itching  ? ? ?Family History  ?Problem Relation Age of Onset  ? Heart attack Father   ? Heart disease Sister   ? Heart disease Brother   ? Diabetes Mother   ? Hypertension Mother   ? Heart disease Mother   ? Heart attack Mother   ? Stroke Mother   ? Colon cancer Neg Hx   ? ? ? ?Prior to Admission medications   ?Medication Sig Start Date End Date Taking? Authorizing Provider  ?acetaminophen (TYLENOL) 325 MG tablet Take 650 mg by mouth every 4 (four) hours as needed for moderate pain.   Yes [provider]  ?albuterol (VENTOLIN HFA) 108 (90 Base) MCG/ACT inhaler Inhale 2 puffs into the lungs every 4 (four) hours as needed for wheezing or shortness of breath. 05/04/20  Yes Medina-Vargas, Monina C, NP  ?amLODipine (NORVASC) 10 MG tablet Take 10 mg by mouth in the morning.   Yes [provider]  ?apixaban (ELIQUIS) 5 MG TABS tablet Take 1 tablet (5 mg total) by mouth 2 (two) times daily. 05/04/20  Yes Medina-Vargas, Monina C, NP  ?DULoxetine (CYMBALTA) 60 MG capsule Take 1 capsule (60 mg total) by mouth in the morning. 11/19/20  Yes Rehman, Areeg N, DO  ?hydrALAZINE (APRESOLINE) 50 MG tablet Take 1 tablet (50 mg total) by mouth 3 (three) times daily. 05/04/20  Yes Medina-Vargas, Monina C, NP  ?HYDROcodone-acetaminophen (NORCO/VICODIN) 5-325 MG tablet Take 1 tablet by mouth every 4 (four) hours as needed for moderate pain. 10/14/20 10/14/21 Yes Setzer, Edman Circle, PA-C  ?isosorbide mononitrate (IMDUR) 30 MG 24 hr tablet Take 30 mg by mouth in the morning.   Yes [provider]  ?lidocaine-prilocaine (EMLA) cream 1 application daily as needed (port access). 04/08/21  Yes [provider]  ?linagliptin (TRADJENTA) 5 MG TABS tablet Take 5 mg by mouth in the morning.   Yes [provider]  ?loratadine (CLARITIN) 10 MG  tablet Take 10 mg by mouth daily as needed for allergies. 08/23/21  Yes [provider]  ?metoprolol tartrate (LOPRESSOR) 25 MG tablet Take 0.5 tablets (12.5 mg total) by mouth in the morning and at bedtime. ?Patient taking differently: Take 25 mg by mouth daily. 11/19/20  Yes Rehman, Areeg N, DO  ?pantoprazole (PROTONIX) 20 MG tablet Take 1 tablet (20 mg total) by mouth in the morning. 11/19/20  Yes Rehman, Areeg N, DO  ?pravastatin (PRAVACHOL) 40 MG tablet Take 1 tablet (40 mg total) by mouth in the morning. 11/20/20  Yes Orvis Brill, MD  ?rOPINIRole (REQUIP) 0.25 MG tablet  Take 0.25 mg by mouth daily. 10/21/20  Yes [provider]  ?sulfamethoxazole-trimethoprim (BACTRIM) 400-80 MG tablet Take 1 tablet by mouth 2 (two) times daily. 08/24/21  Yes [provider]  ?ACCU-CHEK FASTCLIX LANCETS MISC USE AS DIRECTED TO CHECK BLOOD SUGAR FOUR TIMES A DAY(BEFORE MEALS AND AT BEDTIME). 10/14/12   Sid Falcon, MD  ?Jerold Coombe test strip CHECK BLOOD SUGAR BEFORE MEALS AND AT BEDTIME 10/14/12   Sid Falcon, MD  ?aspirin EC 81 MG tablet Take 1 tablet (81 mg total) by mouth daily. Swallow whole. ?Patient not taking: Reported on 05/25/2021 11/19/20 11/19/21  Mike Craze, DO  ?Insulin Pen Needle (EASY TOUCH PEN NEEDLES) 31G X 8 MM MISC Use to inject insulin as instructed. 02/02/12   Bartholomew Crews, MD  ?Nelva Nay SOLOSTAR 300 UNIT/ML Solostar Pen Inject 4 Units into the skin at bedtime. ?Patient not taking: Reported on 05/25/2021 11/19/20   Rehman, Areeg N, DO  ?atorvastatin (LIPITOR) 40 MG tablet Take 1 tablet (40 mg total) by mouth daily. 11/19/20 11/19/20  Orvis Brill, MD  ? ? ?Physical Exam: ?Vitals:  ? 08/28/21 2130 08/28/21 2200 08/28/21 2230 08/28/21 2300  ?BP: (!) 179/105  (!) 161/61 (!) 133/52  ?Pulse: 80 97  93  ?Resp: 13 (!) '22 13 12  '$ ?Temp:      ?TempSrc:      ?SpO2: 100% 100%  98%  ? ? ?Constitutional: NAD, calm  ?Eyes: PERTLA, lids and conjunctivae normal ?ENMT:  Mucous membranes are moist. Posterior pharynx clear of any exudate or lesions.   ?Neck: supple, no masses  ?Respiratory: no wheezing, no crackles. No accessory muscle use.  ?Cardiovascular: S1 & S2 heard, regular

## 2021-08-28 NOTE — Consult Note (Signed)
[facility] Service ?Consult Note ?Ann [name] ? ?Ann [name] ?08/28/2021 ?[name], MD ?Requesting Physician: Dr. Jeronimo Greaves  ? ?Reason for Consult: ESRD pt w/ falls and gen'd weakness ?HPI: The patient is a 71 y.o. year-old w/ hx of IDDM, chronic back pain, HL, HTN, ESRD on HD, CAD, asthma, DJD who presented to ED w/ c/o not feeling well, gen'd weakness and having falls. Has chronic back pain R side. Has missed several HD sessions. Being admitted. Asked to see for dialysis.   ? ?Pt seen in ED. In no distress. Is having problems at home mostly related to her mobility and chronic back pain. She lives with her granddaughter, but she usually sleeps in and so the patient misses her HD sessions. Pt states she stills voids "a lot" and her gdtr thinks she doesn't really need to be on HD.  She has had some falls. Has had R sided back pains for 2-3 years. Has new mild tremors of the UE's. No jerking or LOC, no somnolence. Finally feels more forgetful ever since she had the COVID 19 infection.  ? ?ROS - denies CP, no joint pain, no HA, no blurry vision, no rash, no diarrhea, no nausea/ vomiting, no dysuria, no difficulty voiding ? ? ?Past Medical History  ?Past Medical History:  ?Diagnosis Date  ? Anemia 09/29/2008  ? anemia of chronic disease  ? Anginal pain ([place])   ? no recent chest pain  ? Arthritis   ? Asthma 09/29/2008  ? CAD (coronary artery disease) 09/29/2008  ? hx  [facility] - [facility]. Stress test 10/2011 nl with EF 58%. [place] card., last cath 2010  ? Chest pain 02/16/2009  ? ESRD on hemodialysis ([place])   ? MWF started HD in early 2022  ? GERD (gastroesophageal reflux disease)   ? Hyperlipidemia 10/21/2009  ? Hypertension 09/29/2008  ? Insulin dependent diabetes mellitus type IA ([place]) 09/29/2008  ? Lumbar back pain 02/16/2009  ? states pain goes down right leg  ? Myalgia 02/16/2009  ? Shortness of breath   ? with exerction  ? Shoulder pain, left 03/16/2009  ? occurs at night  ? Sleep apnea   ? no cpap   ? ?Past Surgical History  ?Past Surgical History:  ?Procedure Laterality Date  ? ABDOMINAL HYSTERECTOMY    ? APPENDECTOMY    ? AV FISTULA PLACEMENT Right 08/12/2020  ? Procedure: BRACHIOCEPHALIC ARTERIOVENOUS (AV) FISTULA CREATION RIGHT;  Surgeon: Serafina Mitchell, MD;  Location: [facility];  Service: Vascular;  Laterality: Right;  ? BASCILIC VEIN TRANSPOSITION Right 10/14/2020  ? Procedure: RIGHT ARM SECOND STAGE BRACHIOCEPHALIC FISTULA TRANSPOSITION;  Surgeon: Serafina Mitchell, MD;  Location: [facility];  Service: Vascular;  Laterality: Right;  ? CARDIAC CATHETERIZATION  2008  ? in [place], [place] stable  ? CARDIAC CATHETERIZATION  2010  ? patent stents in mid and distal RCA; high grade disease in prox & mid AV groove LCX& OM; mod disease in prox & mid LAD normal LV FUNCTION--TREATED MEDICALLY    ? CATARACT EXTRACTION, BILATERAL    ? CHOLECYSTECTOMY    ? CORONARY ANGIOPLASTY WITH STENT PLACEMENT  2005  ? 2 vessel PCI at [facility].  ? [place]  ? EF 60%  ? IR FLUORO GUIDE CV LINE LEFT  04/21/2020  ? IR US GUIDE VASC ACCESS LEFT  04/21/2020  ? TOTAL KNEE ARTHROPLASTY  12/2011  ? Left  ? TOTAL KNEE ARTHROPLASTY  11/28/2011  ? Procedure: TOTAL KNEE ARTHROPLASTY;  Surgeon: Hessie Dibble, MD;  Location: [facility];  Service: Orthopedics;  Laterality: Right;  ? ?Family History  ?Family History  ?Problem Relation Age of Onset  ? Heart attack Father   ? Heart disease Sister   ? Heart disease Brother   ? Diabetes Mother   ? Hypertension Mother   ? Heart disease Mother   ? Heart attack Mother   ? Stroke Mother   ? Colon cancer Neg Hx   ? ?Social History  reports that she has never smoked. Her smokeless tobacco use includes snuff. She reports that she does not drink alcohol and does not use drugs. ?Allergies  ?Allergies  ?Allergen Reactions  ? Penicillins Anaphylaxis  ? Codeine Nausea And Vomiting  ? Metronidazole Itching  ? ?Home medications ?Prior to Admission medications   ?Medication Sig Start Date End Date Taking?  Authorizing Provider  ?acetaminophen (TYLENOL) 325 MG tablet Take 650 mg by mouth every 4 (four) hours as needed for moderate pain.   Yes [provider]  ?albuterol (VENTOLIN HFA) 108 (90 Base) MCG/ACT inhaler Inhale 2 puffs into the lungs every 4 (four) hours as needed for wheezing or shortness of breath. 05/04/20  Yes Medina-Vargas, Monina C, NP  ?amLODipine (NORVASC) 10 MG tablet Take 10 mg by mouth in the morning.   Yes [provider]  ?apixaban (ELIQUIS) 5 MG TABS tablet Take 1 tablet (5 mg total) by mouth 2 (two) times daily. 05/04/20  Yes Medina-Vargas, Monina C, NP  ?DULoxetine (CYMBALTA) 60 MG capsule Take 1 capsule (60 mg total) by mouth in the morning. 11/19/20  Yes Rehman, Areeg N, DO  ?hydrALAZINE (APRESOLINE) 50 MG tablet Take 1 tablet (50 mg total) by mouth 3 (three) times daily. 05/04/20  Yes Medina-Vargas, Monina C, NP  ?HYDROcodone-acetaminophen (NORCO/VICODIN) 5-325 MG tablet Take 1 tablet by mouth every 4 (four) hours as needed for moderate pain. 10/14/20 10/14/21 Yes Setzer, Edman Circle, PA-C  ?isosorbide mononitrate (IMDUR) 30 MG 24 hr tablet Take 30 mg by mouth in the morning.   Yes [provider]  ?lidocaine-prilocaine (EMLA) cream 1 application daily as needed (port access). 04/08/21  Yes [provider]  ?linagliptin (TRADJENTA) 5 MG TABS tablet Take 5 mg by mouth in the morning.   Yes [provider]  ?loratadine (CLARITIN) 10 MG tablet Take 10 mg by mouth daily as needed for allergies. 08/23/21  Yes [provider]  ?metoprolol tartrate (LOPRESSOR) 25 MG tablet Take 0.5 tablets (12.5 mg total) by mouth in the morning and at bedtime. ?Patient taking differently: Take 25 mg by mouth daily. 11/19/20  Yes Rehman, Areeg N, DO  ?pantoprazole (PROTONIX) 20 MG tablet Take 1 tablet (20 mg total) by mouth in the morning. 11/19/20  Yes Rehman, Areeg N, DO  ?pravastatin (PRAVACHOL) 40 MG tablet Take 1 tablet (40 mg total) by mouth in the morning. 11/20/20   Yes Orvis Brill, MD  ?rOPINIRole (REQUIP) 0.25 MG tablet Take 0.25 mg by mouth daily. 10/21/20  Yes [provider]  ?sulfamethoxazole-trimethoprim (BACTRIM) 400-80 MG tablet Take 1 tablet by mouth 2 (two) times daily. 08/24/21  Yes [provider]  ?ACCU-CHEK FASTCLIX LANCETS MISC USE AS DIRECTED TO CHECK BLOOD SUGAR FOUR TIMES A DAY(BEFORE MEALS AND AT BEDTIME). 10/14/12   Sid Falcon, MD  ?Jerold Coombe test strip CHECK BLOOD SUGAR BEFORE MEALS AND AT BEDTIME 10/14/12   Sid Falcon, MD  ?aspirin EC 81 MG tablet Take 1 tablet (81 mg total) by  mouth daily. Swallow whole. ?Patient not taking: Reported on 05/25/2021 11/19/20 11/19/21  Mike Craze, DO  ?Insulin Pen Needle (EASY TOUCH PEN NEEDLES) 31G X 8 MM MISC Use to inject insulin as instructed. 02/02/12   Bartholomew Crews, MD  ?Nelva Nay SOLOSTAR 300 UNIT/ML Solostar Pen Inject 4 Units into the skin at bedtime. ?Patient not taking: Reported on 05/25/2021 11/19/20   Rehman, Areeg N, DO  ?atorvastatin (LIPITOR) 40 MG tablet Take 1 tablet (40 mg total) by mouth daily. 11/19/20 11/19/20  Orvis Brill, MD  ? ? ? ?Vitals:  ? 08/28/21 1845 08/28/21 1900 08/28/21 1930 08/28/21 2000  ?BP: (!) 159/80 133/60 (!) 149/74 (!) 157/86  ?Pulse: 95 89 90 86  ?Resp: '16 10 17 19  '$ ?Temp:      ?TempSrc:      ?SpO2: 100% 100% 100% 100%  ? ?Exam ?Gen alert, no distress, older AAF, pleasant and Ox 3 ?No rash, cyanosis or gangrene ?Sclera anicteric, throat clear  ?No jvd or bruits ?Chest clear bilat to bases, no rales/ wheezing ?RRR no RG ?Abd soft obese ntnd no mass or ascites +bs ?GU defer ?MS no joint effusions or deformity ?Ext no LE or UE edema, no wounds or ulcers ?Neuro is alert, Ox 3 , nf ?   RUA AVF +bruit ? ? ? Home meds include - norvasc, eliquis, cymbalta, hydralazine 50 tid, norco, imdur, tradjenta, lopressor 25 hs, protonix, pravachol, asa, toujeo solostar 4u hs, prns/ vits / supps ? ? ? ? OP HD: MWF [place] ? 4h  400/500  80.5kg  3K/2.5Ca  bath  RUA AVF  Hep 2000 ? - last HD 4/26, 80.9 > 80.5 ? - last Hb 11.1 on 4/24 ? - mircera 30 ug q2wks, last 4/3, due 4/17 ? - venofer 50 weekly on wed ? - hectorol 1 ug once per week, last 4/26, q wed ?

## 2021-08-29 ENCOUNTER — Observation Stay (HOSPITAL_COMMUNITY): Payer: 59

## 2021-08-29 DIAGNOSIS — R5381 Other malaise: Secondary | ICD-10-CM

## 2021-08-29 LAB — BASIC METABOLIC PANEL
Anion gap: 6 (ref 5–15)
BUN: 36 mg/dL — ABNORMAL HIGH (ref 8–23)
CO2: 24 mmol/L (ref 22–32)
Calcium: 8.5 mg/dL — ABNORMAL LOW (ref 8.9–10.3)
Chloride: 111 mmol/L (ref 98–111)
Creatinine, Ser: 4.79 mg/dL — ABNORMAL HIGH (ref 0.44–1.00)
GFR, Estimated: 9 mL/min — ABNORMAL LOW (ref >60)
Glucose, Bld: 137 mg/dL — ABNORMAL HIGH (ref 70–99)
Potassium: 4.8 mmol/L (ref 3.5–5.1)
Sodium: 141 mmol/L (ref 135–145)

## 2021-08-29 LAB — CBG MONITORING, ED: Glucose-Capillary: 120 mg/dL — ABNORMAL HIGH (ref 70–99)

## 2021-08-29 LAB — CBC
HCT: 33 % — ABNORMAL LOW (ref 36.0–46.0)
Hemoglobin: 10.6 g/dL — ABNORMAL LOW (ref 12.0–15.0)
MCH: 29.6 pg (ref 26.0–34.0)
MCHC: 32.1 g/dL (ref 30.0–36.0)
MCV: 92.2 fL (ref 80.0–100.0)
Platelets: 151 10*3/uL (ref 150–400)
RBC: 3.58 MIL/uL — ABNORMAL LOW (ref 3.87–5.11)
RDW: 14.7 % (ref 11.5–15.5)
WBC: 7 10*3/uL (ref 4.0–10.5)
nRBC: 0 % (ref 0.0–0.2)

## 2021-08-29 LAB — HEMOGLOBIN A1C
Hgb A1c MFr Bld: 6.1 % — ABNORMAL HIGH (ref 4.8–5.6)
Mean Plasma Glucose: 128.37 mg/dL

## 2021-08-29 LAB — PHOSPHORUS: Phosphorus: 3.4 mg/dL (ref 2.5–4.6)

## 2021-08-29 LAB — HEPATITIS B SURFACE ANTIBODY,QUALITATIVE: Hep B S Ab: REACTIVE — AB

## 2021-08-29 LAB — GLUCOSE, CAPILLARY
Glucose-Capillary: 104 mg/dL — ABNORMAL HIGH (ref 70–99)
Glucose-Capillary: 144 mg/dL — ABNORMAL HIGH (ref 70–99)

## 2021-08-29 LAB — HEPATITIS B SURFACE ANTIGEN: Hepatitis B Surface Ag: NONREACTIVE

## 2021-08-29 LAB — ALBUMIN: Albumin: 2.9 g/dL — ABNORMAL LOW (ref 3.5–5.0)

## 2021-08-29 MED ORDER — ACETAMINOPHEN 325 MG PO TABS
650.0000 mg | ORAL_TABLET | Freq: Four times a day (QID) | ORAL | Status: DC | PRN
  Administered 2021-08-29 – 2021-08-31 (×2): 650 mg via ORAL
  Filled 2021-08-29: qty 2

## 2021-08-29 MED ORDER — HEPARIN SODIUM (PORCINE) 1000 UNIT/ML DIALYSIS: 2000.0000 [IU] | Freq: Once | INTRAMUSCULAR | Status: DC

## 2021-08-29 MED ORDER — HYDROMORPHONE HCL 1 MG/ML IJ SOLN
0.5000 mg | Freq: Once | INTRAMUSCULAR | Status: AC | PRN
  Administered 2021-08-29: 0.5 mg via INTRAVENOUS
  Filled 2021-08-29: qty 1

## 2021-08-29 MED ORDER — LIDOCAINE 5 % EX PTCH
1.0000 | MEDICATED_PATCH | CUTANEOUS | Status: DC
  Administered 2021-08-29 – 2021-08-31 (×3): 1 via TRANSDERMAL
  Filled 2021-08-29 (×3): qty 1

## 2021-08-29 MED ORDER — TRAMADOL HCL 50 MG PO TABS
50.0000 mg | ORAL_TABLET | Freq: Four times a day (QID) | ORAL | Status: DC | PRN
  Administered 2021-08-29: 50 mg via ORAL
  Filled 2021-08-29: qty 1

## 2021-08-29 MED ORDER — ACETAMINOPHEN 650 MG RE SUPP: 650.0000 mg | Freq: Four times a day (QID) | RECTAL | Status: DC | PRN

## 2021-08-29 NOTE — Evaluation (Signed)
OT Cancellation Note ? ?Patient Details ?Name: Ann [name] ?MRN: [id number] ?DOB: 04/11/1951 ? ? ?Cancelled Treatment:    Reason Eval/Treat Not Completed: Patient at procedure or test/ unavailable Patient currently off floor at HD, OT will follow back to complete evaluation as time permits.  ? ?Ann [name], OTR/L ?Acute Rehabilitation Services ?[phone] ?[phone]  ? ?Ann [name] ?08/29/2021, 10:47 AM ?

## 2021-08-29 NOTE — ED Notes (Signed)
Received verbal report from Kuch Y RN at this time 

## 2021-08-29 NOTE — ED Notes (Signed)
Verbal report given to Katrina Y at this time ?

## 2021-08-29 NOTE — ED Notes (Signed)
Per pt oxycodone makes her itch and she can't take it. Provider sent a message reference same ?

## 2021-08-29 NOTE — Progress Notes (Signed)
?[facility] ?Progress Note  ? ?Subjective:   seen on HD, having some back spasms but tolerating ok ? ?Objective ?Vitals:  ? 08/29/21 0400 08/29/21 0500 08/29/21 0600 08/29/21 0700  ?BP: (!) 159/67 (!) 142/66 (!) 161/88 (!) 167/63  ?Pulse: 69 66 70 68  ?Resp: (!) 5 13 (!) 8 11  ?Temp:      ?TempSrc:      ?SpO2: 100% 100% 100% 100%  ?Weight:      ?Height:      ? ?Physical Exam ?General: lying on strethcer ?Heart: RRR ?Lungs: clear ?Abdomen: soft, obese ?Extremities: no edema ?Dialysis Access:  RUE AVF +t/b ? ?Additional Objective ?Labs: ?Basic Metabolic Panel: ?Recent Labs  ?Lab 08/28/21 ?1840 08/29/21 ?0218  ?NA 139 141  ?K 4.7 4.8  ?CL 108 111  ?CO2 20* 24  ?GLUCOSE 109* 137*  ?BUN 35* 36*  ?CREATININE 4.93* 4.79*  ?CALCIUM 9.5 8.5*  ?PHOS  --  3.4  ? ?Liver Function Tests: ?Recent Labs  ?Lab 08/29/21 ?0218  ?ALBUMIN 2.9*  ? ?No results for input(s): LIPASE, AMYLASE in the last 168 hours. ?CBC: ?Recent Labs  ?Lab 08/28/21 ?1840 08/29/21 ?0218  ?WBC 5.2 7.0  ?NEUTROABS 2.9  --   ?HGB 12.8 10.6*  ?HCT 40.1 33.0*  ?MCV 92.4 92.2  ?PLT 172 151  ? ?Blood Culture ?   ?Component Value Date/Time  ? SDES BLOOD LEFT ANTECUBITAL 04/11/2020 1023  ? SPECREQUEST  04/11/2020 1023  ?  BOTTLES DRAWN AEROBIC AND ANAEROBIC Blood Culture adequate volume  ? CULT  04/11/2020 1023  ?  NO GROWTH 5 DAYS ?Performed at [facility], [place] [address] [place], [place] [address] ?  ? REPTSTATUS 04/16/2020 FINAL 04/11/2020 1023  ? ? ?Cardiac Enzymes: ?Recent Labs  ?Lab 08/28/21 ?2105  ?CKTOTAL 41  ? ?CBG: ?Recent Labs  ?Lab 08/28/21 ?1859 08/28/21 ?2152 08/29/21 ?0729  ?GLUCAP 94 88 120*  ? ?Iron Studies: No results for input(s): IRON, TIBC, TRANSFERRIN, FERRITIN in the last 72 hours. ?'@lablastinr3'$ @ ?Studies/Results: ?DG Lumbar Spine 2-3 Views ? ?Result Date: 08/28/2021 ?CLINICAL DATA:  Falls.  Pain. EXAM: LUMBAR SPINE - 2-3 VIEW COMPARISON:  None FINDINGS: L5 is not well visualized on the lateral view. Within this limitation,  no fracture or traumatic malalignment identified. Facet degenerative changes are noted. Calcified atherosclerosis is identified in the abdominal aorta. IMPRESSION: 1. Limited visualization of L5 on the lateral view. No fracture or traumatic malalignment identified. 2. Facet degenerative changes. 3. Calcified atherosclerosis in the abdominal aorta. Electronically Signed   By: Dorise Bullion III M.D.   On: 08/28/2021 19:27   ?Medications: ? ? amLODipine  10 mg Oral q AM  ? apixaban  5 mg Oral BID  ? Chlorhexidine Gluconate Cloth  6 each Topical Q0600  ? [START ON 08/31/2021] doxercalciferol  1 mcg Intravenous Q Wed-HD  ? DULoxetine  60 mg Oral q AM  ? [START ON 08/30/2021] heparin  2,000 Units Dialysis Once in dialysis  ? hydrALAZINE  50 mg Oral TID  ? insulin aspart  0-6 Units Subcutaneous TID WC  ? isosorbide mononitrate  30 mg Oral q AM  ? lidocaine  1 patch Transdermal Q24H  ? metoprolol tartrate  12.5 mg Oral BID  ? pantoprazole  20 mg Oral q AM  ? pravastatin  40 mg Oral q AM  ? rOPINIRole  0.25 mg Oral Daily  ? ? OP HD: MWF [place] ? 4h  400/500  80.5kg  3K/2.5Ca bath  RUA AVF  Hep 2000 ? - last HD 4/26, 80.9 > 80.5 ? - last Hb 11.1 on 4/24 ? - mircera 30 ug q2wks, last 4/3, due 4/17 ? - venofer 50 weekly on wed ? - hectorol 1 ug once per week, last 4/26, q wed ?  ?  ?Assessment/ Plan: ?Generalized weakness/ falls - not sure cause, doesn't appear uremic. Has chronic back pain problem.  ?ESRD - on HD MWF. Last HD was 4/26, missed 4 sessions. Labs are pretty good (creat 4s) for missing that much HD. Still makes urine. Not grossly uremic, although gen'd weakness could be a symptom. Will see if she improves w/ dialysis. On HD x 1 year, still has significant UOP still. She wonders if she might not need HD, or possibly less HD? Will try to collect 24h urine while she's in - use purewick as some incont.  ?Chronic back pain ?HTN - cont meds ?Volume - no vol excess / edema on exam. Pt states they "never need to pull any  fluid off" at OP HD.  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 Cont IV vdra. Not sure binder.  ? ?Jannifer Hick MD ?08/29/2021, 7:41 AM  ?[facility] ?Pager: [phone] ? ? ?

## 2021-08-29 NOTE — ED Notes (Signed)
Pt daughter called and wanted an update on mother status, [name] [phone].  ?

## 2021-08-29 NOTE — Progress Notes (Signed)
PT Cancellation Note ? ?Patient Details ?Name: Ann [name] ?MRN: [id number] ?DOB: [birth year] ? ? ?Cancelled Treatment:    Reason Eval/Treat Not Completed: Patient at procedure or test/unavailable Pt currently in HD. Will follow up as schedule allows.  ? ?Reuel Derby, PT, DPT  ?Acute Rehabilitation Services  ?Pager: [phone] ?Office: [phone] ? ?[place] ?08/29/2021, 10:45 AM ?

## 2021-08-29 NOTE — Progress Notes (Signed)
?  Transition of Care (TOC) Screening Note ? ? ?Patient Details  ?Name: Ann [name] ?Date of Birth: July 23, 1950 ? ? ?Transition of Care (TOC) CM/SW Contact:    ?Benard Halsted, LCSW ?Phone Number: ?08/29/2021, 10:37 AM ? ? ? ?Transition of Care Department [facility]) has reviewed patient and no TOC needs have been identified at this time. We will continue to monitor patient advancement through interdisciplinary progression rounds. If new patient transition needs arise, please place a TOC consult. ? ? ?

## 2021-08-29 NOTE — ED Notes (Signed)
Breakfast order placed ?

## 2021-08-29 NOTE — Progress Notes (Signed)
?PROGRESS NOTE ? ? ? ?Female Ann [name]  FIE:[id number] DOB: 31-Mar-1951 DOA: 08/28/2021 ?PCP: Ann Mile, NP ? ? ?Brief Narrative:  ?HPI: Ann [name] is a pleasant 71 y.o. female with medical history significant for CAD with PCI to RCA in 2010, hypertension, type 2 diabetes mellitus, OSA not on CPAP, chronic back pain, and ESRD on dialysis for a little over a year, now presenting to Ann emergency department for evaluation of general weakness, fatigue, and recurrent falls.  [name] reports that she has not been to dialysis since April 26 due to feeling too tired and generally weak, and noting that she is not sure that she actually needs it.  She has not noticed any acute change in vision or focal numbness or weakness, but feels tired and generally weak.  She has had some difficulty with balance but denies dizziness or falling to one side consistently.  She has felt that her legs gave out, and this has caused her to fall onto her buttock multiple times in Ann past 2 weeks.  She uses a walker at home.  Denies chest pain, fevers, or chills.  She had a pressure sensation in her bladder recently, was treated with Bactrim which improved her symptoms, but did not improve her general weakness and fatigue. ?  ?ED Course: Upon arrival to Ann ED, [name] is found to be afebrile and saturating well on room air with stable blood pressure.  EKG features sinus tachycardia.  Plain radiographs of Ann lumbar spine are negative for fracture or traumatic malalignment.  Chemistry panel notable for bicarbonate of 20 and BUN of 35.  Nephrology was consulted by Ann ED physician and Ann [name] was treated with Norco. ? ?Assessment & Plan: ?  ?Principal Problem: ?  Debility ?Active Problems: ?  Diabetes mellitus with ESRD (end-stage renal disease) ([place]) ?  HYPERTENSION ?  CAD (coronary artery disease) ?  End stage renal disease ([place]) ?  Obstructive sleep apnea ?  History of deep vein thrombosis (DVT) of lower extremity ? ?Generalized  weakness/frequent falls/debility: [name] with frequent falls.  No other weakness other than generalized weakness.  This could very well be due to missing dialysis.  She is getting dialysis now.  We will see how she does after that.  I have consulted PT OT. ? ?ESRD on HD: [name] has missed 4 sessions of dialysis.  Nephrology on board.  She is getting dialysis now. ? ?Type 2 diabetes mellitus: Hemoglobin A1c in July 2022 was 5.8%.  Continue SSI. ? ?Essential hypertension: Blood pressure controlled.  Continue home medications. ? ?History of DVT: History of right lower extremity DVT in 2021 in Ann setting of COVID infection, she is on Eliquis. ? ?History of CAD: No anginal symptoms.  Continue statin and Imdur. ? ?History of OSA: Does not use CPAP at night. ? ?Back pain: Secondary to falls.  Lumbar spine x-ray negative.  We will see how she does with Ann PT, if further concerns, will consider CT lumbar spine. ? ?DVT prophylaxis:   Eliquis ?  Code Status: Full Code  ?Family Communication:  None present at bedside.  Plan of care discussed with [name] in length and he/she verbalized understanding and agreed with it. ? ?Status is: Observation ?Ann [name] will require care spanning > 2 midnights and should be moved to inpatient because: Will need to be assessed by PT OT. ? ? ?Estimated body mass index is 32.19 kg/m? as calculated from Ann following: ?  Height as of this encounter: 5'  2" (1.575 m). ?  Weight as of this encounter: 79.8 kg. ? ?  ?Nutritional Assessment: ?Body mass index is 32.19 kg/m?Marland KitchenMarland Kitchen ?Seen by dietician.  I agree with Ann assessment and plan as outlined below: ?Nutrition Status: ?  ?  ?  ? ?. ?Skin Assessment: ?I have examined Ann [name]'s skin and I agree with Ann wound assessment as performed by Ann wound care RN as outlined below: ?  ? ?Consultants:  ?Nephrology ? ?Procedures:  ?None ? ?Antimicrobials:  ?Anti-infectives (From admission, onward)  ? ? None  ? ?  ?  ? ? ?Subjective: ?Seen and examined in  Ann dialysis unit.  She has no complaints other than back pain. ? ?Objective: ?Vitals:  ? 08/29/21 0816 08/29/21 0827 08/29/21 0900 08/29/21 0930  ?BP: (!) 149/51 (!) 147/60 (!) 100/38 (!) 112/53  ?Pulse: 66 64 74 75  ?Resp: 18 18    ?Temp: (!) 97.5 ?F (36.4 ?C)     ?TempSrc: Tympanic     ?SpO2: 100%     ?Weight:      ?Height:      ? ?No intake or output data in Ann 24 hours ending 08/29/21 0940 ?Filed Weights  ? 08/29/21 0126  ?Weight: 79.8 kg  ? ? ?Examination: ? ?General exam: Appears calm and comfortable, obese ?Respiratory system: Clear to auscultation. Respiratory effort normal. ?Cardiovascular system: S1 & S2 heard, RRR. No JVD, murmurs, rubs, gallops or clicks. No pedal edema. ?Gastrointestinal system: Abdomen is nondistended, soft and nontender. No organomegaly or masses felt. Normal bowel sounds heard. ?Central nervous system: Alert and oriented. No focal neurological deficits. ?Extremities: Symmetric 5 x 5 power. ?Skin: No rashes, lesions or ulcers ?Psychiatry: Judgement and insight appear normal. Mood & affect appropriate.  ? ? ?Data Reviewed: I have personally reviewed following labs and imaging studies ? ?CBC: ?Recent Labs  ?Lab 08/28/21 ?1840 08/29/21 ?0218  ?WBC 5.2 7.0  ?NEUTROABS 2.9  --   ?HGB 12.8 10.6*  ?HCT 40.1 33.0*  ?MCV 92.4 92.2  ?PLT 172 151  ? ?Basic Metabolic Panel: ?Recent Labs  ?Lab 08/28/21 ?1840 08/29/21 ?0218  ?NA 139 141  ?K 4.7 4.8  ?CL 108 111  ?CO2 20* 24  ?GLUCOSE 109* 137*  ?BUN 35* 36*  ?CREATININE 4.93* 4.79*  ?CALCIUM 9.5 8.5*  ?MG 1.7  --   ?PHOS  --  3.4  ? ?GFR: ?Estimated Creatinine Clearance: 10.4 mL/min (A) (by C-G formula based on SCr of 4.79 mg/dL (H)). ?Liver Function Tests: ?Recent Labs  ?Lab 08/29/21 ?0218  ?ALBUMIN 2.9*  ? ?No results for input(s): LIPASE, AMYLASE in Ann last 168 hours. ?Recent Labs  ?Lab 08/28/21 ?2105  ?AMMONIA 19  ? ?Coagulation Profile: ?No results for input(s): INR, PROTIME in Ann last 168 hours. ?Cardiac Enzymes: ?Recent Labs  ?Lab  08/28/21 ?2105  ?CKTOTAL 41  ? ?BNP (last 3 results) ?No results for input(s): PROBNP in Ann last 8760 hours. ?HbA1C: ?No results for input(s): HGBA1C in Ann last 72 hours. ?CBG: ?Recent Labs  ?Lab 08/28/21 ?1859 08/28/21 ?2152 08/29/21 ?0729  ?GLUCAP 94 88 120*  ? ?Lipid Profile: ?No results for input(s): CHOL, HDL, LDLCALC, TRIG, CHOLHDL, LDLDIRECT in Ann last 72 hours. ?Thyroid Function Tests: ?Recent Labs  ?  08/28/21 ?2105  ?TSH 1.418  ? ?Anemia Panel: ?Recent Labs  ?  08/28/21 ?2105  ?VITAMINB12 275  ? ?Sepsis Labs: ?No results for input(s): PROCALCITON, LATICACIDVEN in Ann last 168 hours. ? ?No results found for this or any previous visit (from  Ann past 240 hour(s)).  ? ?Radiology Studies: ?DG Lumbar Spine 2-3 Views ? ?Result Date: 08/28/2021 ?CLINICAL DATA:  Falls.  Pain. EXAM: LUMBAR SPINE - 2-3 VIEW COMPARISON:  None FINDINGS: L5 is not well visualized on Ann lateral view. Within this limitation, no fracture or traumatic malalignment identified. Facet degenerative changes are noted. Calcified atherosclerosis is identified in Ann abdominal aorta. IMPRESSION: 1. Limited visualization of L5 on Ann lateral view. No fracture or traumatic malalignment identified. 2. Facet degenerative changes. 3. Calcified atherosclerosis in Ann abdominal aorta. Electronically Signed   By: Dorise Bullion III M.D.   On: 08/28/2021 19:27   ? ?Scheduled Meds: ? amLODipine  10 mg Oral q AM  ? apixaban  5 mg Oral BID  ? Chlorhexidine Gluconate Cloth  6 each Topical Q0600  ? [START ON 08/31/2021] doxercalciferol  1 mcg Intravenous Q Wed-HD  ? DULoxetine  60 mg Oral q AM  ? [START ON 08/30/2021] heparin  2,000 Units Dialysis Once in dialysis  ? hydrALAZINE  50 mg Oral TID  ? insulin aspart  0-6 Units Subcutaneous TID WC  ? isosorbide mononitrate  30 mg Oral q AM  ? lidocaine  1 patch Transdermal Q24H  ? metoprolol tartrate  12.5 mg Oral BID  ? pantoprazole  20 mg Oral q AM  ? pravastatin  40 mg Oral q AM  ? rOPINIRole  0.25 mg Oral Daily   ? ?Continuous Infusions: ? ? LOS: 0 days  ? ?Darliss Cheney, MD ?[facility] ? ?08/29/2021, 9:40 AM  ? ?*Please note that this is a verbal dictation therefore any spelling or grammatical errors are due to Ann "Dr

## 2021-08-29 NOTE — Plan of Care (Signed)

## 2021-08-29 NOTE — Evaluation (Signed)
Physical Therapy Evaluation ?Patient Details ?Name: Ann [name] ?MRN: [id number] ?DOB: [birth year] ?Today's Date: 08/29/2021 ? ?History of Present Illness ? Pt is a 71 y/o female admitted secondary to multiple falls and weakness. PMH includes HTN, DM, CAD, OSA on CPAP, ESRD on HD.  ?Clinical Impression ? Pt admitted secondary to problem above with deficits below. Pt with severe R side hip/back pain that limited mobility this session. Attempted sitting X4-5, but was unable to complete secondary to pain. Repositioned pt on L side with pillow under R hip with mod A. Plan to progress mobility further during next session to determine most appropriate d/c recommendations. Will continue to follow acutely.  ?   ? ?Recommendations for follow up therapy are one component of a multi-disciplinary discharge planning process, led by the attending physician.  Recommendations may be updated based on patient status, additional functional criteria and insurance authorization. ? ?Follow Up Recommendations Other (comment) (TBD pending progression) ? ?  ?Assistance Recommended at Discharge Frequent or constant Supervision/Assistance  ?Patient can return home with the following ? A little help with walking and/or transfers;A little help with bathing/dressing/bathroom;Assistance with cooking/housework;Help with stairs or ramp for entrance;Assist for transportation ? ?  ?Equipment Recommendations Other (comment) (TBD)  ?Recommendations for Other Services ?    ?  ?Functional Status Assessment Patient has had a recent decline in their functional status and demonstrates the ability to make significant improvements in function in a reasonable and predictable amount of time.  ? ?  ?Precautions / Restrictions Precautions ?Precautions: Fall ?Restrictions ?Weight Bearing Restrictions: No  ? ?  ? ?Mobility ? Bed Mobility ?Overal bed mobility: Needs Assistance ?Bed Mobility: Rolling ?Rolling: Mod assist ?  ?  ?  ?  ?General bed mobility comments:  Attempted to sit up X4-5 times, but after rolling, pt rolling back onto her back and yelling in pain. Repositioned onto L side to help with pain management. ?  ? ?Transfers ?  ?  ?  ?  ?  ?  ?  ?  ?  ?  ?  ? ?Ambulation/Gait ?  ?  ?  ?  ?  ?  ?  ?  ? ?Stairs ?  ?  ?  ?  ?  ? ?Wheelchair Mobility ?  ? ?Modified Rankin (Stroke Patients Only) ?  ? ?  ? ?Balance   ?  ?  ?  ?  ?  ?  ?  ?  ?  ?  ?  ?  ?  ?  ?  ?  ?  ?  ?   ? ? ? ?Pertinent Vitals/Pain Pain Assessment ?Pain Assessment: 0-10 ?Pain Score: 10-Worst pain ever ?Pain Location: back (R side) ?Pain Descriptors / Indicators: Grimacing, Guarding, Moaning ?Pain Intervention(s): Limited activity within patient's tolerance, Monitored during session, Repositioned  ? ? ?Home Living Family/patient expects to be discharged to:: Private residence ?Living Arrangements: Other relatives (granddaughter) ?Available Help at Discharge: Family;Available 24 hours/day ?Type of Home: Apartment ?Home Access: Level entry ?  ?  ?  ?Home Layout: One level ?Home Equipment: Rollator (4 wheels) ?   ?  ?Prior Function Prior Level of Function : Independent/Modified Independent ?  ?  ?  ?  ?  ?  ?Mobility Comments: Reports she is independent with rollator ?  ?  ? ? ?Hand Dominance  ?   ? ?  ?Extremity/Trunk Assessment  ? Upper Extremity Assessment ?Upper Extremity Assessment: Defer to OT evaluation ?  ? ?Lower Extremity Assessment ?Lower  Extremity Assessment: Generalized weakness ?  ? ?Cervical / Trunk Assessment ?Cervical / Trunk Assessment: Other exceptions ?Cervical / Trunk Exceptions: back spasms  ?Communication  ? Communication: No difficulties  ?Cognition Arousal/Alertness: Awake/alert ?Behavior During Therapy: [facility] for tasks assessed/performed ?Overall Cognitive Status: No family/caregiver present to determine baseline cognitive functioning ?  ?  ?  ?  ?  ?  ?  ?  ?  ?  ?  ?  ?  ?  ?  ?  ?  ?  ?  ? ?  ?General Comments   ? ?  ?Exercises    ? ?Assessment/Plan  ?  ?PT Assessment Patient  needs continued PT services  ?PT Problem List Decreased strength;Decreased activity tolerance;Decreased mobility;Decreased balance;Decreased knowledge of use of DME;Decreased knowledge of precautions;Pain ? ?   ?  ?PT Treatment Interventions DME instruction;Gait training;Functional mobility training;Therapeutic activities;Therapeutic exercise;Balance training;Patient/family education   ? ?PT Goals (Current goals can be found in the Care Plan section)  ?Acute Rehab PT Goals ?Patient Stated Goal: to decrease pain ?PT Goal Formulation: With patient ?Time For Goal Achievement: 09/12/21 ?Potential to Achieve Goals: Fair ? ?  ?Frequency Min 3X/week ?  ? ? ?Co-evaluation   ?  ?  ?  ?  ? ? ?  ?AM-PAC PT "6 Clicks" Mobility  ?Outcome Measure Help needed turning from your back to your side while in a flat bed without using bedrails?: A Lot ?Help needed moving from lying on your back to sitting on the side of a flat bed without using bedrails?: A Lot ?Help needed moving to and from a bed to a chair (including a wheelchair)?: A Lot ?Help needed standing up from a chair using your arms (e.g., wheelchair or bedside chair)?: A Lot ?Help needed to walk in hospital room?: A Lot ?Help needed climbing 3-5 steps with a railing? : A Lot ?6 Click Score: 12 ? ?  ?End of Session   ?Activity Tolerance: Patient limited by pain ?Patient left: in bed;with call bell/phone within reach;with nursing/sitter in room;with bed alarm set ?Nurse Communication: Mobility status ?PT Visit Diagnosis: Unsteadiness on feet (R26.81);Repeated falls (R29.6);Muscle weakness (generalized) (M62.81);History of falling (Z91.81) ?  ? ?Time: 1245-8099 ?PT Time Calculation (min) (ACUTE ONLY): 18 min ? ? ?Charges:   PT Evaluation ?$PT Eval Moderate Complexity: 1 Mod ?  ?  ?   ? ? ?Reuel Derby, PT, DPT  ?Acute Rehabilitation Services  ?Pager: [phone] ?Office: [phone] ? ? ?[place] ?08/29/2021, 4:27 PM ? ?

## 2021-08-29 NOTE — Evaluation (Signed)
Occupational Therapy Evaluation ?Patient Details ?Name: Ann [name] ?MRN: [id number] ?DOB: July 12, 1950 ?Today's Date: 08/29/2021 ? ? ?History of Present Illness Pt is a 71 y/o female admitted secondary to multiple falls and weakness. PMH includes HTN, DM, CAD, OSA on CPAP, ESRD on HD.  ? ?Clinical Impression ?  ?Ann [name] was evaluated s/p the above admission list, she is generally mod I at baseline with use of rollator, lives with her granddaughter for 24/7 assist in a 1 level home with level entry. Upon evaluation pt was limited by R hip/lower back pain and poor activity tolerance. She required mod A for bed mobility, min A for 2x sit<>stand transfer and close min G for side stepping at the bed side. Due to pain, she required up to max A for LB ADLs. Pt will benefit from OT acutely. Recommend d/c to home with [facility] and direct physical assist from her granddaughter.   ? ?Recommendations for follow up therapy are one component of a multi-disciplinary discharge planning process, led by the attending physician.  Recommendations may be updated based on patient status, additional functional criteria and insurance authorization.  ? ?Follow Up Recommendations ? Home health OT (pending pt acute progression and pain management)  ?  ?Assistance Recommended at Discharge Frequent or constant Supervision/Assistance  ?Patient can return home with the following A lot of help with walking and/or transfers;A lot of help with bathing/dressing/bathroom;Assistance with cooking/housework;Assist for transportation;Help with stairs or ramp for entrance ? ?  ?Functional Status Assessment ? Patient has had a recent decline in their functional status and demonstrates the ability to make significant improvements in function in a reasonable and predictable amount of time.  ?Equipment Recommendations ? Other (comment) (pending progression)  ?  ?Recommendations for Other Services   ? ? ?  ?Precautions / Restrictions Precautions ?Precautions:  Fall ?Precaution Comments: painful R hip ?Restrictions ?Weight Bearing Restrictions: No  ? ?  ? ?Mobility Bed Mobility ?Overal bed mobility: Needs Assistance ?Bed Mobility: Rolling, Supine to Sit, Sit to Supine ?Rolling: Mod assist ?  ?Supine to sit: Mod assist ?Sit to supine: Mod assist ?  ?General bed mobility comments: limited by pain, required significant increased time ?  ? ?Transfers ?Overall transfer level: Needs assistance ?Equipment used: Rollator (4 wheels) ?Transfers: Sit to/from Stand ?Sit to Stand: Min assist ?  ?  ?  ?  ?  ?General transfer comment: incr time for pain management ?  ? ?  ?Balance Overall balance assessment: Needs assistance ?Sitting-balance support: Feet supported ?Sitting balance-Leahy Scale: Good ?  ?  ?Standing balance support: Bilateral upper extremity supported, During functional activity ?Standing balance-Leahy Scale: Poor ?  ?  ?  ?  ?  ?  ?  ?  ?  ?  ?  ?  ?   ? ?ADL either performed or assessed with clinical judgement  ? ?ADL Overall ADL's : Needs assistance/impaired ?Eating/Feeding: Independent;Sitting ?  ?Grooming: Set up;Sitting ?  ?Upper Body Bathing: Set up;Sitting ?  ?Lower Body Bathing: Maximal assistance;Sit to/from stand ?  ?Upper Body Dressing : Set up;Sitting ?  ?Lower Body Dressing: Maximal assistance;Sit to/from stand ?  ?Toilet Transfer: Stand-pivot;Rollator (4 wheels);BSC/3in1;Minimal assistance ?  ?Toileting- Clothing Manipulation and Hygiene: Maximal assistance;Sit to/from stand ?  ?  ?  ?Functional mobility during ADLs: Minimal assistance ?General ADL Comments: limited by R hip pain, general weakness and poor activity tolerance  ? ? ? ?Vision Baseline Vision/History: 0 No visual deficits ?Ability to See in Adequate Light: 0 Adequate ?Vision  Assessment?: No apparent visual deficits  ?   ?Perception   ?  ?Praxis   ?  ? ?Pertinent Vitals/Pain Pain Assessment ?Pain Assessment: Faces ?Faces Pain Scale: Hurts worst ?Pain Location: R hip ?Pain Descriptors /  Indicators: Grimacing, Guarding, Moaning ?Pain Intervention(s): Limited activity within patient's tolerance, Monitored during session  ? ? ? ?Hand Dominance Right ?  ?Extremity/Trunk Assessment Upper Extremity Assessment ?Upper Extremity Assessment: Generalized weakness ?  ?Lower Extremity Assessment ?Lower Extremity Assessment: Defer to PT evaluation ?  ?Cervical / Trunk Assessment ?Cervical / Trunk Assessment: Other exceptions ?Cervical / Trunk Exceptions: back spasms ?  ?Communication Communication ?Communication: No difficulties ?  ?Cognition Arousal/Alertness: Awake/alert ?Behavior During Therapy: [facility] for tasks assessed/performed ?Overall Cognitive Status: No family/caregiver present to determine baseline cognitive functioning ?  ?  ?  ?  ?  ?  ?  ?  ?  ?  ?  ?  ?  ?  ?  ?  ?General Comments: overall WFL - would benefit from further executive functioning assessment ?  ?  ?General Comments  VSS on RA ? ?  ?Exercises   ?  ?Shoulder Instructions    ? ? ?Home Living Family/patient expects to be discharged to:: Private residence ?Living Arrangements: Other relatives ?Available Help at Discharge: Family;Available 24 hours/day ?Type of Home: Apartment ?Home Access: Level entry ?  ?  ?Home Layout: One level ?  ?  ?Bathroom Shower/Tub: Tub/shower unit ?  ?Bathroom Toilet: Standard ?  ?  ?Home Equipment: Rollator (4 wheels) ?  ?  ?  ? ?  ?Prior Functioning/Environment Prior Level of Function : Independent/Modified Independent ?  ?  ?  ?  ?  ?  ?Mobility Comments: Reports she is independent with rollator ?ADLs Comments: indep, including IADL. does not drive ?  ? ?  ?  ?OT Problem List: Decreased strength;Decreased range of motion;Decreased activity tolerance;Impaired balance (sitting and/or standing);Decreased safety awareness;Decreased knowledge of use of DME or AE;Pain ?  ?   ?OT Treatment/Interventions: Self-care/ADL training;Therapeutic exercise;Patient/family education;Balance training;Therapeutic activities;DME  and/or AE instruction  ?  ?OT Goals(Current goals can be found in the care plan section) Acute Rehab OT Goals ?Patient Stated Goal: less pain ?OT Goal Formulation: With patient ?Time For Goal Achievement: 09/12/21 ?Potential to Achieve Goals: Good ?ADL Goals ?Pt Will Perform Grooming: with modified independence;standing ?Pt Will Perform Lower Body Bathing: with modified independence;sit to/from stand ?Pt Will Perform Lower Body Dressing: with modified independence;sit to/from stand ?Pt Will Transfer to Toilet: with modified independence;ambulating  ?OT Frequency: Min 2X/week ?  ? ?Co-evaluation   ?  ?  ?  ?  ? ?  ?AM-PAC OT "6 Clicks" Daily Activity     ?Outcome Measure Help from another person eating meals?: None ?Help from another person taking care of personal grooming?: A Little ?Help from another person toileting, which includes using toliet, bedpan, or urinal?: A Lot ?Help from another person bathing (including washing, rinsing, drying)?: A Lot ?Help from another person to put on and taking off regular upper body clothing?: A Little ?Help from another person to put on and taking off regular lower body clothing?: A Lot ?6 Click Score: 16 ?  ?End of Session Equipment Utilized During Treatment: Rollator (4 wheels) ?Nurse Communication: Mobility status;Other (comment) (R hip pain) ? ?Activity Tolerance: Patient limited by pain ?Patient left: in bed;with call bell/phone within reach;with bed alarm set ? ?OT Visit Diagnosis: Unsteadiness on feet (R26.81);Other abnormalities of gait and mobility (R26.89);Repeated falls (R29.6);Muscle weakness (generalized) (  M62.81);History of falling (Z91.81);Pain  ?              ?Time: 6759-1638 ?OT Time Calculation (min): 20 min ?Charges:  OT General Charges ?$OT Visit: 1 Visit ?OT Evaluation ?$OT Eval Moderate Complexity: 1 Mod ? ? ?Felicia Both A Eliabeth Shoff ?08/29/2021, 5:04 PM ?

## 2021-08-30 DIAGNOSIS — R5381 Other malaise: Secondary | ICD-10-CM | POA: Diagnosis not present

## 2021-08-30 LAB — HEPATITIS B SURFACE ANTIBODY, QUANTITATIVE: Hep B S AB Quant (Post): 315 m[IU]/mL (ref >9.9)

## 2021-08-30 LAB — GLUCOSE, CAPILLARY
Glucose-Capillary: 132 mg/dL — ABNORMAL HIGH (ref 70–99)
Glucose-Capillary: 178 mg/dL — ABNORMAL HIGH (ref 70–99)
Glucose-Capillary: 175 mg/dL — ABNORMAL HIGH (ref 70–99)
Glucose-Capillary: 128 mg/dL — ABNORMAL HIGH (ref 70–99)

## 2021-08-30 MED ORDER — CHLORHEXIDINE GLUCONATE CLOTH 2 % EX PADS
6.0000 | MEDICATED_PAD | Freq: Every day | CUTANEOUS | Status: DC
  Administered 2021-08-30 – 2021-08-31 (×2): 6 via TOPICAL

## 2021-08-30 NOTE — TOC Initial Note (Addendum)
Transition of Care (TOC) - Initial/Assessment Note  ? ? ?Patient Details  ?Name: Ann [name] ?MRN: [id number] ?Date of Birth: 06/26/1950 ? ?Transition of Care (TOC) CM/SW Contact:    ?Benard Halsted, LCSW ?Phone Number: ?08/30/2021, 3:18 PM ? ?Clinical Narrative:                 ?CSW received consult for possible SNF placement at time of discharge. CSW spoke with patient. Patient reported that patient's family is currently unable to care for patient at home given patient?s current physical needs and fall risk. Patient expressed understanding of PT recommendation and is agreeable to SNF placement at time of discharge. Patient reports she normally gets dialysis transport through her Medicaid. CSW discussed insurance authorization process and provided Medicare SNF ratings list. CSW provided SNF bed offers. Patient has selected [facility] and gave CSW permission to call her daughter and update her. CSW spoke with patient's daughter and she reported agreement with the plan. CSW requested family come pick up the rollator that is in patient's room as an ambulance will not transport it. Will start insurance authorization process, Ref# [id number]. [facility] will send CSW admission paperwork to complete with the patient in the morning.  ? ?Skilled Nursing Rehab Facilities-   [email or web address]   Ratings out of 5 possible   ?Name Address  Phone # Quality Care Staffing Health Inspection Overall  ?[facility] [address] [place] '4 5 2 3  '$ ?[facility]  [address], [place] [phone] '3 2 5 5  '$ ?[facility] [place] '3 1 1 1  '$ ?[place] [address], [place] '3 2 4 4  '$ ?[facility] [address] [place] '1 1 2 1  '$ ?[facility] '2 1 4 3  '$ ?[facility] [address], [place] '5 2 3 4  '$ ?[facility] [address], [place] '5 2 2 3  '$ ?[facility] ([facility]) [address] [place] [phone] '5 1 2 2  '$ ?[facility] [address], [place] [phone] '4 1 2 1  '$ ?[facility] [address], [place] [phone] '4 1 2 1  '$ ?[facility] ([place]) [place] [phone] '4 1 1 1  '$ ?[facility]ust[facility] Flock [address] [place] [phone] '3 2 4 4  '$ ?        ?[facility] [address] [place]      ?[facility] '4 2 3 3  '$ ?[facility] [place], [place] '4 1 5 4  '$ ?[facility] [address] [place] [address], [place] [phone] '2 1 1 1  '$ ?[facility] [address] [place] [phone] '2 1 3 2  '$ ?        ?[address] (no [facility]) [address], [place] [phone] '4 5 5 5  '$ ?[facility] (No [employer]) [address], [place] '3 1 4 3  '$ ?[facility] (No [employer]) [address] [place] [phone] '5 5 5 5  '$ ?[facility] [address] [employer] [phone] '3 2 4 4  '$ ?[place] [address], [place] '1 1 2 1  '$ ?[facility] [address], [place] [phone] '2 1 1 1  '$ ?[facility] [place] '5 2 4 5  '$ ?[facility] [address] [place] '3 1 1 1  '$ ?  [facility] [place] '2 1 2 1  '$ ?        ?[facility] [address] [place] [phone] '1 1 1 1  '$ ?[facility] [address] [place]  [phone] '2 4 2 2  '$ ?[facility] [place] [address], [place] [phone] '5 2 3 4  '$ ?[facility] [address], [place] '2 1 1 1  '$ ?[facility] (No [employer]) [address], [place] '2 1 3 2  '$ ?[facility] [address], [place] [phone] '3 1 1 1  '$ ?        ?[facility] [place], [place] '5 4 5 5  '$ ?[facility])  [address], [place] '2 2 3 3  '$ ?[facility]) [place], [place] '3 2 4 4  '$ ?[facility] [address] [place] '4 3 4 4  '$ ?[address] [place], [place] '3 3 1 1  '$ ?[facility]) [place], [place] '2 2 4 4  '$ ? ? ? ?Expected Discharge Plan: [place] ?Barriers to Discharge: Ship broker, SNF Pending bed offer, Continued Medical Work up ? ? ?Patient Goals and CMS Choice ?Patient states their goals for this hospitalization and ongoing recovery are:: Rehab ?CMS Medicare.gov Compare Post Acute Care list provided to:: Patient ?Choice offered to / list presented to : Patient, Adult Children ? ?Expected Discharge Plan and Services ?Expected Discharge Plan: [address] ?In-house Referral: Clinical Social Work ?  ?Post Acute Care Choice: [place] ?Living arrangements for the past 2 months: [place] ?                ?  ?  ?  ?  ?  ?  ?  ?  ?  ?  ? ?Prior Living Arrangements/Services ?Living arrangements for the past 2 months: [place] ?Lives with:: Self ?Patient language and need for interpreter reviewed:: Yes ?Do you feel safe going back to the place where you live?: Yes      ?Need for Family Participation in Patient Care: No (Comment) ?Care giver support system in place?: No (comment) ?Current home services: DME (Rollator in room) ?Criminal Activity/Legal Involvement Pertinent to Current Situation/Hospitalization: No - Comment as needed ? ?Activities of Daily Living ?Home Assistive Devices/Equipment: Gilford Rile (specify type), Wheelchair ?ADL Screening (condition at time of admission) ?Patient's cognitive ability adequate to safely complete daily activities?: Yes ?Is the patient deaf or have difficulty hearing?: No ?Does the patient have difficulty seeing,  even when wearing glasses/contacts?: No ?Does the patient have difficulty concentrating, remembering, or making decisions?: No ?Patient able to express need for assistance with ADLs?: Yes ?Does the patient have difficulty dressing or bathing?: No ?Independently performs ADLs?: Yes (appropriate for developmental age) ?Does the patient have difficulty walking or climbing stairs?: Yes ?Weakness of Legs: Both ?Weakness of Arms/Hands: None ? ?Permission Sought/Granted ?Permission sought to share information with : Facility Sport and exercise psychologist, Family Supports ?Permission granted to share information with : Yes, Verbal Permission Granted ? Share Information with NAME: Felicia ? Permission granted to share info w AGENCY: SNFs ? Permission granted to share info w Relationship: Daughter ? Permission granted to share info w Contact Information: [phone] ? ?Emotional Assessment ?Appearance:: Appears stated age ?Attitude/Demeanor/Rapport: Gracious, Engaged ?Affect (typically observed): Accepting, Appropriate, Pleasant ?Orientation: : Oriented to Self, Oriented to Place, Oriented  to  Time, Oriented to Situation ?Alcohol / Substance Use: Not Applicable ?Psych Involvement: No (comment) ? ?Admission diagnosis:  Debility [R53.81] ?Weakness [R53.1] ?Fall, initial encounter [W19.XXXA] ?Patient Active Problem List  ? Diagnosis Date Noted  ? Debility 08/28/2021  ? History of deep vein thrombosis (DVT) of lower extremity 08/28/2021  ? Hypoglycemia 11/17/2020  ? Syncope, near 11/17/2020  ? Dependence on renal dialysis ([place]) 06/22/2020  ? Unspecified protein-calorie malnutrition ([place]) 05/05/2020  ? Decubitus ulcer of buttock 04/22/2020  ? Allergy, unspecified, initial encounter 04/22/2020  ? Anaphylactic shock, unspecified, initial encounter 04/22/2020  ? Anemia in chronic kidney disease 04/22/2020  ? Coagulation defect, unspecified ([place]) 04/22/2020  ? Complication of vascular dialysis catheter 04/22/2020  ? End stage renal disease  ([place]) 04/22/2020  ? Iron deficiency anemia, unspecified 04/22/2020  ? Obstructive sleep apnea 04/22/2020  ? Other asthma 04/22/2020  ? Pruritus, unspecified 04/22/2020  ? Secondary hyperparathyroidism of

## 2021-08-30 NOTE — Progress Notes (Signed)
Physical Therapy Treatment ?Patient Details ?Name: Ann [name] ?MRN: [id number] ?DOB: Jan 02, 1951 ?Today's Date: 08/30/2021 ? ? ?History of Present Illness Pt is a 71 y/o female admitted secondary to multiple falls and weakness. PMH includes HTN, DM, CAD, OSA on CPAP, ESRD on HD. ? ?  ?PT Comments  ? ? Patient able to transfer bed to chair with rollator and 1 person assist, however unable to ambulate due to feels she will fall. (Pt admitted with multiple falls). Able to demonstrate functional strength to perform sit to stand x 5 reps with min guard assist (and incr time due to pt wanting to rest after each rep), however gait deferred due to pt's reporting she is afraid she will fall. Will benefit from +2 assist to attempt ambulation (likely with chair following closely). Discussed that she could benefit from SNF and pt considering.  ?   ?Recommendations for follow up therapy are one component of a multi-disciplinary discharge planning process, led by the attending physician.  Recommendations may be updated based on patient status, additional functional criteria and insurance authorization. ? ?Follow Up Recommendations ? Skilled nursing-short term rehab (<3 hours/day) ?  ?  ?Assistance Recommended at Discharge Frequent or constant Supervision/Assistance  ?Patient can return home with the following A little help with bathing/dressing/bathroom;Assistance with cooking/housework;Help with stairs or ramp for entrance;Assist for transportation;Two people to help with walking and/or transfers ?  ?Equipment Recommendations ? Other (comment) (TBD)  ?  ?Recommendations for Other Services   ? ? ?  ?Precautions / Restrictions Precautions ?Precautions: Fall ?Precaution Comments: painful R hip ?Restrictions ?Weight Bearing Restrictions: No  ?  ? ?Mobility ? Bed Mobility ?Overal bed mobility: Needs Assistance ?Bed Mobility: Rolling, Sidelying to Sit ?Rolling: Min assist (with rail) ?Sidelying to sit: Mod assist ?  ?  ?  ?General bed  mobility comments: required significant increased time and cues for technique; use of bed pad to rotate hips around to EOB and pt then able to push up to sitting ?  ? ?Transfers ?Overall transfer level: Needs assistance ?Equipment used: Rollator (4 wheels) ?Transfers: Sit to/from Stand ?Sit to Stand: Min assist ?  ?  ?  ?  ?  ?General transfer comment: vc for safest technique; pt prefers come to stand with hands on handles. ?  ? ?Ambulation/Gait ?  ?  ?  ?  ?  ?  ?Pre-gait activities: lateral wt-shifting in stance with self-limits wt-shift to her left; then reports it is her rt knee that usually gives her trouble (?) ?General Gait Details: deferred due to pt reporting her legs feel weak and that's why she keeps falling; will need chair follow ? ? ?Stairs ?  ?  ?  ?  ?  ? ? ?Wheelchair Mobility ?  ? ?Modified Rankin (Stroke Patients Only) ?  ? ? ?  ?Balance Overall balance assessment: Needs assistance ?Sitting-balance support: Feet supported ?Sitting balance-Leahy Scale: Good ?  ?  ?Standing balance support: Bilateral upper extremity supported, During functional activity ?Standing balance-Leahy Scale: Poor ?  ?  ?  ?  ?  ?  ?  ?  ?  ?  ?  ?  ?  ? ?  ?Cognition Arousal/Alertness: Awake/alert ?Behavior During Therapy: [facility] for tasks assessed/performed ?Overall Cognitive Status: No family/caregiver present to determine baseline cognitive functioning ?  ?  ?  ?  ?  ?  ?  ?  ?  ?  ?  ?  ?  ?  ?  ?  ?General  Comments: overall WFL - would benefit from further executive functioning assessment ?  ?  ? ?  ?Exercises Other Exercises ?Other Exercises: sit to stand x 5 from recliner with cues for proper hand placement; incr time and encouragement needed for pt to participate "I'm feeling lazy today" ? ?  ?General Comments   ?  ?  ? ?Pertinent Vitals/Pain Pain Assessment ?Pain Assessment: Faces ?Faces Pain Scale: Hurts little more ?Pain Location: low back ?Pain Descriptors / Indicators: Guarding, Moaning ?Pain Intervention(s):  Limited activity within patient's tolerance, Monitored during session, Repositioned  ? ? ?Home Living   ?  ?  ?  ?  ?  ?  ?  ?  ?  ?   ?  ?Prior Function    ?  ?  ?   ? ?PT Goals (current goals can now be found in the care plan section) Acute Rehab PT Goals ?Patient Stated Goal: to decrease pain ?Time For Goal Achievement: 09/12/21 ?Potential to Achieve Goals: Fair ?Progress towards PT goals: Progressing toward goals ? ?  ?Frequency ? ? ? Min 3X/week ? ? ? ?  ?PT Plan Discharge plan needs to be updated  ? ? ?Co-evaluation   ?  ?  ?  ?  ? ?  ?AM-PAC PT "6 Clicks" Mobility   ?Outcome Measure ? Help needed turning from your back to your side while in a flat bed without using bedrails?: A Lot ?Help needed moving from lying on your back to sitting on the side of a flat bed without using bedrails?: A Lot ?Help needed moving to and from a bed to a chair (including a wheelchair)?: A Lot ?Help needed standing up from a chair using your arms (e.g., wheelchair or bedside chair)?: A Lot ?Help needed to walk in hospital room?: Total ?Help needed climbing 3-5 steps with a railing? : Total ?6 Click Score: 10 ? ?  ?End of Session Equipment Utilized During Treatment: Gait belt ?Activity Tolerance: Patient limited by fatigue ?Patient left: with call bell/phone within reach;in chair;with chair alarm set ?Nurse Communication: Mobility status ?PT Visit Diagnosis: Unsteadiness on feet (R26.81);Repeated falls (R29.6);Muscle weakness (generalized) (M62.81);History of falling (Z91.81) ?  ? ? ?Time: 1025-8527 ?PT Time Calculation (min) (ACUTE ONLY): 33 min ? ?Charges:  $Therapeutic Exercise: 8-22 mins ?$Therapeutic Activity: 8-22 mins          ?          ? ? ?Arby Barrette, PT ?Acute Rehabilitation Services  ?Pager [phone] ?Office [phone] ? ? ? ?Ann [name] ?08/30/2021, 12:33 PM ? ?

## 2021-08-30 NOTE — Progress Notes (Signed)
Pt receives out-pt HD at [facility] on MWF. Pt arrives at 11:35 for 11:50 chair time. Will assist as needed.  ? ?Melven Sartorius ?Renal Navigator ?[phone] ?

## 2021-08-30 NOTE — Plan of Care (Signed)

## 2021-08-30 NOTE — Progress Notes (Signed)
?PROGRESS NOTE ? ? ? ?Ann [name]  OQH:[id number] DOB: 1950/07/08 DOA: 08/28/2021 ?PCP: Cipriano Mile, NP ? ? ?Brief Narrative:  ?HPI: Ann [name] is a pleasant 71 y.o. female with medical history significant for CAD with PCI to RCA in 2010, hypertension, type 2 diabetes mellitus, OSA not on CPAP, chronic back pain, and ESRD on dialysis for a little over a year, now presenting to the emergency department for evaluation of general weakness, fatigue, and recurrent falls.  Patient reports that she has not been to dialysis since April 26 due to feeling too tired and generally weak, and noting that she is not sure that she actually needs it.  She has not noticed any acute change in vision or focal numbness or weakness, but feels tired and generally weak.  She has had some difficulty with balance but denies dizziness or falling to one side consistently.  She has felt that her legs gave out, and this has caused her to fall onto her buttock multiple times in the past 2 weeks.  She uses a walker at home.  Denies chest pain, fevers, or chills.  She had a pressure sensation in her bladder recently, was treated with Bactrim which improved her symptoms, but did not improve her general weakness and fatigue. ?  ?ED Course: Upon arrival to the ED, patient is found to be afebrile and saturating well on room air with stable blood pressure.  EKG features sinus tachycardia.  Plain radiographs of the lumbar spine are negative for fracture or traumatic malalignment.  Chemistry panel notable for bicarbonate of 20 and BUN of 35.  Nephrology was consulted by the ED physician and the patient was treated with Norco. ? ?Assessment & Plan: ?  ?Principal Problem: ?  Debility ?Active Problems: ?  Diabetes mellitus with ESRD (end-stage renal disease) ([place]) ?  HYPERTENSION ?  CAD (coronary artery disease) ?  End stage renal disease ([place]) ?  Obstructive sleep apnea ?  History of deep vein thrombosis (DVT) of lower extremity ? ?Generalized  weakness/frequent falls/debility: Patient with frequent falls.  No other focal weakness or any other complaint other than generalized weakness.  This could very well be due to missing dialysis.  She states that she is feeling better than yesterday but she does not think that the improvement is secondary to dialysis.  She still feels weak and does not feel comfortable or ready to go home.  She is willing to go to SNF if PT recommends.  She was seen by PT OT but recommendations are still pending. ? ?ESRD on HD: Patient has missed 4 sessions of dialysis prior to admission.  Nephrology consulted, she received her dialysis on 08/29/2021. ? ?Type 2 diabetes mellitus: Hemoglobin A1c in July 2022 was 5.8%.  Continue SSI. ? ?Essential hypertension: Blood pressure controlled.  Continue home medications. ? ?History of DVT: History of right lower extremity DVT in 2021 in the setting of COVID infection, she is on Eliquis. ? ?History of CAD: No anginal symptoms.  Continue statin and Imdur. ? ?History of OSA: Does not use CPAP at night. ? ?Back pain/hip pain: Secondary to falls.  Lumbar spine x-ray as well as right hip x-ray negative for fracture, only osteoarthritis.  She did not complain of hip pain to me today. ? ?DVT prophylaxis:   Eliquis ?  Code Status: Full Code  ?Family Communication:  None present at bedside.  Plan of care discussed with patient in length and he/she verbalized understanding and agreed with it. ? ?  Status is: Observation ?The patient will require care spanning > 2 midnights and should be moved to inpatient because: Will need to be assessed by PT OT. ? ? ?Estimated body mass index is 32.19 kg/m? as calculated from the following: ?  Height as of this encounter: '5\' 2"'$  (1.575 m). ?  Weight as of this encounter: 79.8 kg. ? ?  ?Nutritional Assessment: ?Body mass index is 32.19 kg/m?Marland KitchenMarland Kitchen ?Seen by dietician.  I agree with the assessment and plan as outlined below: ?Nutrition Status: ?  ?  ?  ? ?. ?Skin Assessment: ?I  have examined the patient's skin and I agree with the wound assessment as performed by the wound care RN as outlined below: ?  ? ?Consultants:  ?Nephrology ? ?Procedures:  ?None ? ?Antimicrobials:  ?Anti-infectives (From admission, onward)  ? ? None  ? ?  ?  ? ? ?Subjective: ? ?Seen and examined.  She states that she is feeling better than yesterday.  Did not have any specific complaint today other than weakness. ? ?Objective: ?Vitals:  ? 08/29/21 2309 08/30/21 7628 08/30/21 0601 08/30/21 0805  ?BP: (!) 127/58 (!) 93/55 (!) 122/48 131/64  ?Pulse: 84 79 81 85  ?Resp: '19 15 11 13  '$ ?Temp: 97.9 ?F (36.6 ?C) 98.1 ?F (36.7 ?C)  98 ?F (36.7 ?C)  ?TempSrc: Oral Oral  Oral  ?SpO2: 99% 94% 94% 96%  ?Weight:      ?Height:      ? ? ?Intake/Output Summary (Last 24 hours) at 08/30/2021 1116 ?Last data filed at 08/29/2021 1239 ?Gross per 24 hour  ?Intake --  ?Output 1713 ml  ?Net -1713 ml  ? ?Filed Weights  ? 08/29/21 0126  ?Weight: 79.8 kg  ? ? ?Examination: ? ?General exam: Appears calm and comfortable  ?Respiratory system: Clear to auscultation. Respiratory effort normal. ?Cardiovascular system: S1 & S2 heard, RRR. No JVD, murmurs, rubs, gallops or clicks. No pedal edema. ?Gastrointestinal system: Abdomen is nondistended, soft and nontender. No organomegaly or masses felt. Normal bowel sounds heard. ?Central nervous system: Alert and oriented. No focal neurological deficits. ?Extremities: Symmetric 5 x 5 power. ?Skin: No rashes, lesions or ulcers.  ?Psychiatry: Judgement and insight appear normal. Mood & affect appropriate.  ? ?Data Reviewed: I have personally reviewed following labs and imaging studies ? ?CBC: ?Recent Labs  ?Lab 08/28/21 ?1840 08/29/21 ?0218  ?WBC 5.2 7.0  ?NEUTROABS 2.9  --   ?HGB 12.8 10.6*  ?HCT 40.1 33.0*  ?MCV 92.4 92.2  ?PLT 172 151  ? ? ?Basic Metabolic Panel: ?Recent Labs  ?Lab 08/28/21 ?1840 08/29/21 ?0218  ?NA 139 141  ?K 4.7 4.8  ?CL 108 111  ?CO2 20* 24  ?GLUCOSE 109* 137*  ?BUN 35* 36*  ?CREATININE  4.93* 4.79*  ?CALCIUM 9.5 8.5*  ?MG 1.7  --   ?PHOS  --  3.4  ? ? ?GFR: ?Estimated Creatinine Clearance: 10.4 mL/min (A) (by C-G formula based on SCr of 4.79 mg/dL (H)). ?Liver Function Tests: ?Recent Labs  ?Lab 08/29/21 ?0218  ?ALBUMIN 2.9*  ? ? ?No results for input(s): LIPASE, AMYLASE in the last 168 hours. ?Recent Labs  ?Lab 08/28/21 ?2105  ?AMMONIA 19  ? ? ?Coagulation Profile: ?No results for input(s): INR, PROTIME in the last 168 hours. ?Cardiac Enzymes: ?Recent Labs  ?Lab 08/28/21 ?2105  ?CKTOTAL 41  ? ? ?BNP (last 3 results) ?No results for input(s): PROBNP in the last 8760 hours. ?HbA1C: ?Recent Labs  ?  08/29/21 ?0218  ?HGBA1C 6.1*  ? ?  CBG: ?Recent Labs  ?Lab 08/28/21 ?2152 08/29/21 ?0729 08/29/21 ?1616 08/29/21 ?2053 08/30/21 ?4163  ?GLUCAP 88 120* 104* 144* 132*  ? ? ?Lipid Profile: ?No results for input(s): CHOL, HDL, LDLCALC, TRIG, CHOLHDL, LDLDIRECT in the last 72 hours. ?Thyroid Function Tests: ?Recent Labs  ?  08/28/21 ?2105  ?TSH 1.418  ? ? ?Anemia Panel: ?Recent Labs  ?  08/28/21 ?2105  ?VITAMINB12 275  ? ? ?Sepsis Labs: ?No results for input(s): PROCALCITON, LATICACIDVEN in the last 168 hours. ? ?No results found for this or any previous visit (from the past 240 hour(s)).  ? ?Radiology Studies: ?DG Lumbar Spine 2-3 Views ? ?Result Date: 08/28/2021 ?CLINICAL DATA:  Falls.  Pain. EXAM: LUMBAR SPINE - 2-3 VIEW COMPARISON:  None FINDINGS: L5 is not well visualized on the lateral view. Within this limitation, no fracture or traumatic malalignment identified. Facet degenerative changes are noted. Calcified atherosclerosis is identified in the abdominal aorta. IMPRESSION: 1. Limited visualization of L5 on the lateral view. No fracture or traumatic malalignment identified. 2. Facet degenerative changes. 3. Calcified atherosclerosis in the abdominal aorta. Electronically Signed   By: Dorise Bullion III M.D.   On: 08/28/2021 19:27  ? ?DG HIP UNILAT WITH PELVIS 2-3 VIEWS RIGHT ? ?Result Date:  08/29/2021 ?CLINICAL DATA:  Pain. EXAM: DG HIP (WITH OR WITHOUT PELVIS) 2-3V RIGHT COMPARISON:  None Available. FINDINGS: There is mild right hip joint space narrowing. The femoral head is well seated. Mild spurring of the aceta

## 2021-08-30 NOTE — Progress Notes (Signed)
?[facility] ?Progress Note  ? ?Subjective:   Pt seen in room, reports still having some back pain but otherwise feeling well. Denies SOB, CP, dizziness, abdominal pain, nausea and vomiting.  ? ?Objective ?Vitals:  ? 08/29/21 2309 08/30/21 3149 08/30/21 0601 08/30/21 0805  ?BP: (!) 127/58 (!) 93/55 (!) 122/48 131/64  ?Pulse: 84 79 81 85  ?Resp: '19 15 11 13  '$ ?Temp: 97.9 ?F (36.6 ?C) 98.1 ?F (36.7 ?C)  98 ?F (36.7 ?C)  ?TempSrc: Oral Oral  Oral  ?SpO2: 99% 94% 94% 96%  ?Weight:      ?Height:      ? ?Physical Exam ?General: WDWN female, alert and in NAD ?Heart: RRR, no murmurs, rubs or gallops ?Lungs: CTA bilaterally without wheezing, rhonchi or rales ?Abdomen: Soft, non-tender, non-distended, +BS ?Extremities: No edema b/l lower extremities ?Dialysis Access:  RUE AVF +t/b ? ?Additional Objective ?Labs: ?Basic Metabolic Panel: ?Recent Labs  ?Lab 08/28/21 ?1840 08/29/21 ?0218  ?NA 139 141  ?K 4.7 4.8  ?CL 108 111  ?CO2 20* 24  ?GLUCOSE 109* 137*  ?BUN 35* 36*  ?CREATININE 4.93* 4.79*  ?CALCIUM 9.5 8.5*  ?PHOS  --  3.4  ? ?Liver Function Tests: ?Recent Labs  ?Lab 08/29/21 ?0218  ?ALBUMIN 2.9*  ? ?No results for input(s): LIPASE, AMYLASE in the last 168 hours. ?CBC: ?Recent Labs  ?Lab 08/28/21 ?1840 08/29/21 ?0218  ?WBC 5.2 7.0  ?NEUTROABS 2.9  --   ?HGB 12.8 10.6*  ?HCT 40.1 33.0*  ?MCV 92.4 92.2  ?PLT 172 151  ? ?Blood Culture ?   ?Component Value Date/Time  ? SDES BLOOD LEFT ANTECUBITAL 04/11/2020 1023  ? SPECREQUEST  04/11/2020 1023  ?  BOTTLES DRAWN AEROBIC AND ANAEROBIC Blood Culture adequate volume  ? CULT  04/11/2020 1023  ?  NO GROWTH 5 DAYS ?Performed at [facility], [place] [address], [place], [place] [address] ?  ? REPTSTATUS 04/16/2020 FINAL 04/11/2020 1023  ? ? ?Cardiac Enzymes: ?Recent Labs  ?Lab 08/28/21 ?2105  ?CKTOTAL 41  ? ?CBG: ?Recent Labs  ?Lab 08/28/21 ?2152 08/29/21 ?0729 08/29/21 ?1616 08/29/21 ?2053 08/30/21 ?7858  ?GLUCAP 88 120* 104* 144* 132*  ? ?Iron Studies: No results  for input(s): IRON, TIBC, TRANSFERRIN, FERRITIN in the last 72 hours. ?'@lablastinr3'$ @ ?Studies/Results: ?DG Lumbar Spine 2-3 Views ? ?Result Date: 08/28/2021 ?CLINICAL DATA:  Falls.  Pain. EXAM: LUMBAR SPINE - 2-3 VIEW COMPARISON:  None FINDINGS: L5 is not well visualized on the lateral view. Within this limitation, no fracture or traumatic malalignment identified. Facet degenerative changes are noted. Calcified atherosclerosis is identified in the abdominal aorta. IMPRESSION: 1. Limited visualization of L5 on the lateral view. No fracture or traumatic malalignment identified. 2. Facet degenerative changes. 3. Calcified atherosclerosis in the abdominal aorta. Electronically Signed   By: Dorise Bullion III M.D.   On: 08/28/2021 19:27  ? ?DG HIP UNILAT WITH PELVIS 2-3 VIEWS RIGHT ? ?Result Date: 08/29/2021 ?CLINICAL DATA:  Pain. EXAM: DG HIP (WITH OR WITHOUT PELVIS) 2-3V RIGHT COMPARISON:  None Available. FINDINGS: There is mild right hip joint space narrowing. The femoral head is well seated. Mild spurring of the acetabulum. No evidence of pelvis or right hip fracture. No erosive change or bone destruction. No radiographic evidence of avascular necrosis. Pubic symphysis and sacroiliac joints are congruent, both with degenerative change. Technically limited exam due to soft tissue attenuation from habitus. Peripheral vascular calcifications are seen. IMPRESSION: Mild osteoarthritis of the right hip. Electronically Signed   By:  Keith Rake M.D.   On: 08/29/2021 17:01   ?Medications: ? ? amLODipine  10 mg Oral q AM  ? apixaban  5 mg Oral BID  ? Chlorhexidine Gluconate Cloth  6 each Topical Q0600  ? [START ON 08/31/2021] doxercalciferol  1 mcg Intravenous Q Wed-HD  ? DULoxetine  60 mg Oral q AM  ? hydrALAZINE  50 mg Oral TID  ? insulin aspart  0-6 Units Subcutaneous TID WC  ? isosorbide mononitrate  30 mg Oral q AM  ? lidocaine  1 patch Transdermal Q24H  ? metoprolol tartrate  12.5 mg Oral BID  ? pantoprazole  20 mg Oral  q AM  ? pravastatin  40 mg Oral q AM  ? rOPINIRole  0.25 mg Oral Daily  ? ? ?Dialysis Orders: ?MWF [place] ? 4h  400/500  80.5kg  3K/2.5Ca bath  RUA AVF  Hep 2000 ? - last HD 4/26, 80.9 > 80.5 ? - last Hb 11.1 on 4/24 ? - mircera 30 ug q2wks, last 4/3, due 4/17 ? - venofer 50 weekly on wed ? - hectorol 1 ug once per week, last 4/26, q wed ? ?Assessment/Plan: ?Generalized weakness/ falls - not sure cause, doesn't appear uremic. Has chronic back pain problem. PT following ?ESRD - on HD MWF. Last HD prior to admission was 4/26, missed 4 sessions. Labs are pretty good (creat 4s) for missing that much HD. Still makes urine. Not grossly uremic, although gen'd weakness could be a symptom. Will see if she improves w/ dialysis. On HD x 1 year, still has significant UOP still. 24 hour urine ordered. For now, planning for HD tomorrow.  ?Chronic back pain- management per primary tea ?HTN - BP controlled, continue home meds ?Volume - no vol excess / edema on exam. Pt states they "never need to pull any fluid off" at OP HD. Slightly below last EDW, will lower at d/c for accuracy ?Anemia ckd - Hb 10.6, no ESA needed at this time.  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 Not taking a binder, continue to monitor trend ?  ? ?Anice Paganini, PA-C ?08/30/2021, 10:09 AM  ?[facility] ?Pager: [phone] ? ? ?

## 2021-08-30 NOTE — NC FL2 (Signed)
?[place] MEDICAID FL2 LEVEL OF CARE SCREENING TOOL  ?  ? ?IDENTIFICATION  ?Patient Name: ?Ann [name] Birthdate: [birth year] Sex: female Admission Date (Current Location): ?08/28/2021  ?[place] and [place] Number: ? [place] ?  Facility and Address:  ?[facility], [place] [address] [place], [place] [address] ?     Provider Number: ?[id number]  ?Attending Physician Name and Address:  ?Darliss Cheney, MD ? Relative Name and Phone Number:  ?  ?   ?Current Level of Care: ?Hospital Recommended Level of Care: ?[place] Prior Approval Number: ?  ? ?Date Approved/Denied: ?  PASRR Number: ?[id number] A ? ?Discharge Plan: ?SNF ?  ? ?Current Diagnoses: ?Patient Active Problem List  ? Diagnosis Date Noted  ? Debility 08/28/2021  ? History of deep vein thrombosis (DVT) of lower extremity 08/28/2021  ? Hypoglycemia 11/17/2020  ? Syncope, near 11/17/2020  ? Dependence on renal dialysis ([employer]) 06/22/2020  ? Unspecified protein-calorie malnutrition ([place]) 05/05/2020  ? Decubitus ulcer of buttock 04/22/2020  ? Allergy, unspecified, initial encounter 04/22/2020  ? Anaphylactic shock, unspecified, initial encounter 04/22/2020  ? Anemia in chronic kidney disease 04/22/2020  ? Coagulation defect, unspecified ([place]) 04/22/2020  ? Complication of vascular dialysis catheter 04/22/2020  ? End stage renal disease ([place]) 04/22/2020  ? Iron deficiency anemia, unspecified 04/22/2020  ? Obstructive sleep apnea 04/22/2020  ? Other asthma 04/22/2020  ? Pruritus, unspecified 04/22/2020  ? Secondary hyperparathyroidism of renal origin ([place]) 04/22/2020  ? Shortness of breath 04/22/2020  ? Underimmunization status 04/22/2020  ? Acute respiratory failure with hypoxia ([place]) 04/11/2020  ? AV node arrhythmia 04/04/2020  ? AKI (acute kidney injury) ([place]) 04/04/2020  ? Acute renal failure ([place]) 04/04/2020  ? 2019 novel coronavirus disease (COVID-19) 04/03/2020  ? GERD (gastroesophageal reflux disease)   ? Constipation  02/25/2019  ? Pyelonephritis 02/20/2019  ? Acute encephalopathy 02/20/2019  ? Chronic pain 02/20/2019  ? Obesity, Class III, BMI 40-49.9 (morbid obesity) ([place]) 01/28/2013  ? Diabetes mellitus with ESRD (end-stage renal disease) ([place]) 03/25/2012  ? Health care maintenance 03/25/2012  ? Acute blood loss anemia 12/01/2011  ? 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 Plantar fasciitis of right foot 09/28/2011  ? Breast pain 03/08/2011  ? 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 Knee pain, right 01/20/2011  ? Thoracic spine pain 12/13/2010  ? Hyperlipidemia 10/21/2009  ? CKD (chronic kidney disease), stage IV ([place]) 10/20/2009  ? UNSPECIFIED ANEMIA 09/29/2008  ? HYPERTENSION 09/29/2008  ? CAD (coronary artery disease) 09/29/2008  ? ASTHMA, UNSPECIFIED, UNSPECIFIED STATUS 09/29/2008  ? ? ?Orientation RESPIRATION BLADDER Height & Weight   ?  ?Self, Time, Situation, Place ? Normal Incontinent Weight:  (Scale) ?Height:  '5\' 2"'$  (157.5 cm)  ?BEHAVIORAL SYMPTOMS/MOOD NEUROLOGICAL BOWEL NUTRITION STATUS  ?    Continent Diet (See dc summary)  ?AMBULATORY STATUS COMMUNICATION OF NEEDS Skin   ?Limited Assist Verbally Normal ?  ?  ?  ?    ?     ?     ? ? ?Personal Care Assistance Level of Assistance  ?Bathing, Feeding, Dressing Bathing Assistance: Limited assistance ?Feeding assistance: Independent ?Dressing Assistance: Limited assistance ?   ? ?Functional Limitations Info  ?    ?  ?   ? ? ?SPECIAL CARE FACTORS FREQUENCY  ?PT (By licensed PT), OT (By licensed OT)   ?  ?PT Frequency: 5x/week ?OT Frequency: 5x/week ?  ?  ?  ?   ? ? ?Contractures Contractures Info: Not present  ? ? ?  Additional Factors Info  ?Code Status, Allergies, Insulin Sliding Scale Code Status Info: Full ?Allergies Info: Penicillins, Codeine, Metronidazole ?  ?Insulin Sliding Scale Info: See dc summary ?  ?   ? ?Current Medications (08/30/2021):  This is the current hospital active medication list ?Current Facility-Administered Medications  ?Medication Dose Route  Frequency Provider Last Rate Last Admin  ? acetaminophen (TYLENOL) tablet 650 mg  650 mg Oral Q6H PRN Opyd, Ilene Qua, MD   650 mg at 08/29/21 1423  ? Or  ? acetaminophen (TYLENOL) suppository 650 mg  650 mg Rectal Q6H PRN Opyd, Ilene Qua, MD      ? albuterol (PROVENTIL) (2.5 MG/3ML) 0.083% nebulizer solution 3 mL  3 mL Inhalation Q4H PRN Opyd, Ilene Qua, MD      ? amLODipine (NORVASC) tablet 10 mg  10 mg Oral q AM Opyd, Ilene Qua, MD   10 mg at 08/30/21 0603  ? apixaban (ELIQUIS) tablet 5 mg  5 mg Oral BID Vianne Bulls, MD   5 mg at 08/30/21 5885  ? Chlorhexidine Gluconate Cloth 2 % PADS 6 each  6 each Topical Q0600 Roney Jaffe, MD   6 each at 08/30/21 [phone]  ? Chlorhexidine Gluconate Cloth 2 % PADS 6 each  6 each Topical Q0600 Janalee Dane, PA-C   6 each at 08/30/21 1210  ? [START ON 08/31/2021] doxercalciferol (HECTOROL) injection 1 mcg  1 mcg Intravenous Q Wed-HD Roney Jaffe, MD      ? DULoxetine (CYMBALTA) DR capsule 60 mg  60 mg Oral q AM Opyd, Ilene Qua, MD   60 mg at 08/30/21 0604  ? hydrALAZINE (APRESOLINE) tablet 50 mg  50 mg Oral TID Vianne Bulls, MD   50 mg at 08/30/21 4128  ? insulin aspart (novoLOG) injection 0-6 Units  0-6 Units Subcutaneous TID WC Opyd, Ilene Qua, MD      ? isosorbide mononitrate (IMDUR) 24 hr tablet 30 mg  30 mg Oral q AM Opyd, Ilene Qua, MD   30 mg at 08/30/21 0603  ? lidocaine (LIDODERM) 5 % 1 patch  1 patch Transdermal Q24H Opyd, Ilene Qua, MD   1 patch at 08/30/21 0128  ? metoprolol tartrate (LOPRESSOR) tablet 12.5 mg  12.5 mg Oral BID Opyd, Ilene Qua, MD   12.5 mg at 08/30/21 7867  ? ondansetron (ZOFRAN) tablet 4 mg  4 mg Oral Q6H PRN Opyd, Ilene Qua, MD      ? Or  ? ondansetron (ZOFRAN) injection 4 mg  4 mg Intravenous Q6H PRN Opyd, Ilene Qua, MD      ? pantoprazole (PROTONIX) EC tablet 20 mg  20 mg Oral q AM Opyd, Ilene Qua, MD   20 mg at 08/30/21 0604  ? pravastatin (PRAVACHOL) tablet 40 mg  40 mg Oral q AM Opyd, Ilene Qua, MD   40 mg at 08/30/21 0604  ?  rOPINIRole (REQUIP) tablet 0.25 mg  0.25 mg Oral Daily Opyd, Ilene Qua, MD   0.25 mg at 08/30/21 6720  ? senna-docusate (Senokot-S) tablet 1 tablet  1 tablet Oral QHS PRN Opyd, Ilene Qua, MD      ? traMADol (ULTRAM) tablet 50 mg  50 mg Oral Q6H PRN Darliss Cheney, MD   50 mg at 08/29/21 1714  ? ? ? ?Discharge Medications: ?Please see discharge summary for a list of discharge medications. ? ?Relevant Imaging Results: ? ?Relevant Lab Results: ? ? ?Additional Information ?SSN: [id number]. Requires dialysis transport to [place] on MWF. Pt  arrives at 11:35 for 11:50 chair time ? ?Lissa Morales Roda Lauture, LCSW ? ? ? ? ?

## 2021-08-31 DIAGNOSIS — R5381 Other malaise: Secondary | ICD-10-CM | POA: Diagnosis not present

## 2021-08-31 LAB — GLUCOSE, CAPILLARY
Glucose-Capillary: 148 mg/dL — ABNORMAL HIGH (ref 70–99)
Glucose-Capillary: 157 mg/dL — ABNORMAL HIGH (ref 70–99)
Glucose-Capillary: 145 mg/dL — ABNORMAL HIGH (ref 70–99)

## 2021-08-31 MED ORDER — HYDROCODONE-ACETAMINOPHEN 5-325 MG PO TABS: 1.0000 | ORAL_TABLET | ORAL | 0 refills | Status: DC | PRN

## 2021-08-31 NOTE — Progress Notes (Signed)
Pt to d/c to snf today. Contacted [facility] to make clinic aware of pt's d/c today and that pt will resume care on Friday.  ? ?Melven Sartorius ?Renal Navigator ?[phone] ? ? ?

## 2021-08-31 NOTE — Progress Notes (Signed)
Cancelled visit ?Pt. Is at HD in AM. Will attempt visit later if schedule allows.  ?

## 2021-08-31 NOTE — TOC Progression Note (Signed)
Transition of Care (TOC) - Progression Note  ? ? ?Patient Details  ?Name: Waverly Tarquinio Pandit ?MRN: [id number] ?Date of Birth: 06-29-1950 ? ?Transition of Care (TOC) CM/SW Contact  ?Benard Halsted, LCSW ?Phone Number: ?08/31/2021, 9:18 AM ? ?Clinical Narrative:    ?Insurance approval received for [facility]: Ref# [id number], effective 08/31/2021-09/02/2021.  ? ? ?Expected Discharge Plan: [place] ?Barriers to Discharge: Barriers Resolved ? ?Expected Discharge Plan and Services ?Expected Discharge Plan: [place] ?In-house Referral: Clinical Social Work ?  ?Post Acute Care Choice: [place] ?Living arrangements for the past 2 months: [place] ?Expected Discharge Date: 08/31/21               ?  ?  ?  ?  ?  ?  ?  ?  ?  ?  ? ? ?Social Determinants of Health (SDOH) Interventions ?  ? ?Readmission Risk Interventions ? ?  02/23/2019  ? 10:29 AM  ?Readmission Risk Prevention Plan  ?Transportation Screening Complete  ?PCP or Specialist Appt within 5-7 Days Complete  ?Home Care Screening Not Complete  ?Home Care Screening Not Completed Comments NA  ?Medication Review (RN CM) Not Complete  ?Med Review comments NA  ? ? ?

## 2021-08-31 NOTE — Discharge Summary (Signed)
PatientPhysician Discharge Summary  ?Ann [name] Omara UEA:[id number] DOB: 02-Nov-1950 DOA: 08/28/2021 ? ?PCP: Cipriano Mile, NP ? ?Admit date: 08/28/2021 ?Discharge date: 08/31/2021 ? ? ? ?Admitted From: Home ?Disposition: SNF ? ?Recommendations for Outpatient Follow-up:  ?Follow up with PCP in 1-2 weeks ?Please obtain BMP/CBC in one week ?Follow-up for outpatient dialysis on MWF schedule. ?Please follow up with your PCP on the following pending results: ?Unresulted Labs (From admission, onward)  ? ?  Start     Ordered  ? 08/29/21 0855  Creatinine clearance, urine, 24 hour  (Creatinine Clearance, urine, 24-hour panel)  Once,   R       ? 08/29/21 0854  ? 08/29/21 0855  Draw extra clot tube  (Creatinine Clearance, urine, 24-hour panel)  Once,   R       ? 08/29/21 0854  ? Signed and Held  Renal function panel  Once,   R       ? Signed and Held  ? Signed and Held  CBC  Once,   R       ? Signed and Held  ? ?  ?  ? ?  ?  ? ? ?Home Health:   None ?Eqnoneipment/Devices: None ? ?Discharge Condition: Stable ?CODE STATUS: Full code ?Diet recommendation: Renal ? ?Subjective: Seen and examined.  No complaints.  Feels much better now.  Agreeable with the discharge plan to SNF. ? ?Brief/Interim Summary:  Ann [name] is a pleasant 71 y.o. female with medical history significant for CAD with PCI to RCA in 2010, hypertension, type 2 diabetes mellitus, OSA not on CPAP, chronic back pain, and ESRD on dialysis for a little over a year, presented to ED for evaluation of general weakness, fatigue, and recurrent falls.  Patient reports that she has not been to dialysis since April 26 due to feeling too tired and generally weak, and noting that she is not sure that she actually needs it.  She has had some difficulty with balance but denies dizziness or falling to one side consistently.  She has felt that her legs gave out, and this has caused her to fall onto her buttock multiple times in the past 2 weeks.  She uses a walker at home.  No other  complaint. ? ?Upon arrival to ED, she was hemodynamically stable.  Admitted under hospital service.  Details below. ?  ?Generalized weakness/frequent falls/debility: She received her dialysis, after that she started feeling better.  But she did not believe that the dialysis made her feel better.  There were no other signs of infection.  She was assessed by PT OT who recommended SNF and patient was agreeable which has been arranged for her and she is being discharged in stable condition to SNF today. ?  ?ESRD on HD: Patient has missed 4 sessions of dialysis prior to admission.  Nephrology consulted, she received her dialysis on 08/29/2021 and today.  She is highly recommended to resume outpatient dialysis on Monday Wednesday Friday. ?  ?Type 2 diabetes mellitus: Hemoglobin A1c in July 2022 was 5.8%.  Diet controlled. ?  ?Essential hypertension: Blood pressure controlled.  Continue home medications. ?  ?History of DVT: History of right lower extremity DVT in 2021 in the setting of COVID infection, she is on Eliquis. ?  ?History of CAD: No anginal symptoms.  Continue statin and Imdur. ?  ?History of OSA: Does not use CPAP at night. ?  ?Back pain/hip pain: Secondary to falls.  Lumbar spine x-ray as well as  right hip x-ray negative for fracture, only osteoarthritis.  She did not complain of hip pain to me today.  And she feels better. ? ?Discharge plan was discussed with patient and/or family member and they verbalized understanding and agreed with it.  ?Discharge Diagnoses:  ?Principal Problem: ?  Debility ?Active Problems: ?  Diabetes mellitus with ESRD (end-stage renal disease) ([place]) ?  HYPERTENSION ?  CAD (coronary artery disease) ?  End stage renal disease ([place]) ?  Obstructive sleep apnea ?  History of deep vein thrombosis (DVT) of lower extremity ? ? ? ?Discharge Instructions ? ? ?Allergies as of 08/31/2021   ? ?   Reactions  ? Penicillins Anaphylaxis  ? Codeine Nausea And Vomiting  ? Metronidazole Itching  ? ?  ? ?   ?Medication List  ?  ? ?STOP taking these medications   ? ?aspirin EC 81 MG tablet ?  ?sulfamethoxazole-trimethoprim 400-80 MG tablet ?Commonly known as: BACTRIM ?  ? ?  ? ?TAKE these medications   ? ?Accu-Chek FastClix Lancets Misc ?USE AS DIRECTED TO CHECK BLOOD SUGAR FOUR TIMES A DAY(BEFORE MEALS AND AT BEDTIME). ?  ?Accu-Chek SmartView test strip ?Generic drug: glucose blood ?CHECK BLOOD SUGAR BEFORE MEALS AND AT BEDTIME ?  ?acetaminophen 325 MG tablet ?Commonly known as: TYLENOL ?Take 650 mg by mouth every 4 (four) hours as needed for moderate pain. ?  ?albuterol 108 (90 Base) MCG/ACT inhaler ?Commonly known as: VENTOLIN HFA ?Inhale 2 puffs into the lungs every 4 (four) hours as needed for wheezing or shortness of breath. ?  ?amLODipine 10 MG tablet ?Commonly known as: NORVASC ?Take 10 mg by mouth in the morning. ?  ?apixaban 5 MG Tabs tablet ?Commonly known as: ELIQUIS ?Take 1 tablet (5 mg total) by mouth 2 (two) times daily. ?  ?DULoxetine 60 MG capsule ?Commonly known as: CYMBALTA ?Take 1 capsule (60 mg total) by mouth in the morning. ?  ?hydrALAZINE 50 MG tablet ?Commonly known as: APRESOLINE ?Take 1 tablet (50 mg total) by mouth 3 (three) times daily. ?  ?HYDROcodone-acetaminophen 5-325 MG tablet ?Commonly known as: NORCO/VICODIN ?Take 1 tablet by mouth every 4 (four) hours as needed for moderate pain. ?  ?Insulin Pen Needle 31G X 8 MM Misc ?Commonly known as: Easy Touch Pen Needles ?Use to inject insulin as instructed. ?  ?isosorbide mononitrate 30 MG 24 hr tablet ?Commonly known as: IMDUR ?Take 30 mg by mouth in the morning. ?  ?lidocaine-prilocaine cream ?Commonly known as: EMLA ?1 application daily as needed (port access). ?  ?linagliptin 5 MG Tabs tablet ?Commonly known as: TRADJENTA ?Take 5 mg by mouth in the morning. ?  ?loratadine 10 MG tablet ?Commonly known as: CLARITIN ?Take 10 mg by mouth daily as needed for allergies. ?  ?metoprolol tartrate 25 MG tablet ?Commonly known as: LOPRESSOR ?Take  0.5 tablets (12.5 mg total) by mouth in the morning and at bedtime. ?What changed:  ?how much to take ?when to take this ?  ?pantoprazole 20 MG tablet ?Commonly known as: PROTONIX ?Take 1 tablet (20 mg total) by mouth in the morning. ?  ?pravastatin 40 MG tablet ?Commonly known as: PRAVACHOL ?Take 1 tablet (40 mg total) by mouth in the morning. ?  ?rOPINIRole 0.25 MG tablet ?Commonly known as: REQUIP ?Take 0.25 mg by mouth daily. ?  ?Toujeo SoloStar 300 UNIT/ML Solostar Pen ?Generic drug: insulin glargine (1 Unit Dial) ?Inject 4 Units into the skin at bedtime. ?  ? ?  ? ? Contact information  for follow-up providers   ? ? Cipriano Mile, NP Follow up in 1 week(s).   ?Contact information: ?[address] ?[place] [address] ?[phone] ? ? ?  ?  ? ?  ?  ? ? Contact information for after-discharge care   ? ? Destination   ? ? [facility] Preferred SNF .   ?Service: Skilled Nursing ?Contact information: ?[place] ?[place] ?[phone] ? ?  ?  ? ?  ?  ? ?  ?  ? ?  ? ?Allergies  ?Allergen Reactions  ? Penicillins Anaphylaxis  ? Codeine Nausea And Vomiting  ? Metronidazole Itching  ? ? ?Consultations: Nephrology ? ? ?Procedures/Studies: ?DG Lumbar Spine 2-3 Views ? ?Result Date: 08/28/2021 ?CLINICAL DATA:  Falls.  Pain. EXAM: LUMBAR SPINE - 2-3 VIEW COMPARISON:  None FINDINGS: L5 is not well visualized on the lateral view. Within this limitation, no fracture or traumatic malalignment identified. Facet degenerative changes are noted. Calcified atherosclerosis is identified in the abdominal aorta. IMPRESSION: 1. Limited visualization of L5 on the lateral view. No fracture or traumatic malalignment identified. 2. Facet degenerative changes. 3. Calcified atherosclerosis in the abdominal aorta. Electronically Signed   By: Dorise Bullion III M.D.   On: 08/28/2021 19:27  ? ?DG HIP UNILAT WITH PELVIS 2-3 VIEWS RIGHT ? ?Result Date: 08/29/2021 ?CLINICAL DATA:  Pain. EXAM:  DG HIP (WITH OR WITHOUT PELVIS) 2-3V RIGHT COMPARISON:  None Available. FINDINGS: There is mild right hip joint space narrowing. The femoral head is well seated. Mild spurring of the acetabulum. No evidence of

## 2021-08-31 NOTE — Progress Notes (Signed)
?[facility] ?Progress Note  ? ?Subjective:   Seen on HD - no UF today, feeling well. Looks like ready for d/c to SNF later today. There was original concern for high urine output - looks like not really making much while here.  ? ?Objective ?Vitals:  ? 08/31/21 0915 08/31/21 0928 08/31/21 1000 08/31/21 1030  ?BP: (!) 138/58 (!) 128/52 129/60 119/63  ?Pulse:      ?Resp: '11 10 14 13  '$ ?Temp: 98.4 ?F (36.9 ?C) 98.4 ?F (36.9 ?C)    ?TempSrc:      ?SpO2: 98%     ?Weight: 79.2 kg     ?Height:      ? ?Physical Exam ?General: Well appearing woman, NAD ?Heart: RRR; no murmur ?Lungs: CTAB; no rales ?Abdomen: soft ?Extremities: No LE edema ?Dialysis Access:  RUE AVF + thrill ? ?Additional Objective ?Labs: ?Basic Metabolic Panel: ?Recent Labs  ?Lab 08/28/21 ?1840 08/29/21 ?0218  ?NA 139 141  ?K 4.7 4.8  ?CL 108 111  ?CO2 20* 24  ?GLUCOSE 109* 137*  ?BUN 35* 36*  ?CREATININE 4.93* 4.79*  ?CALCIUM 9.5 8.5*  ?PHOS  --  3.4  ? ?Liver Function Tests: ?Recent Labs  ?Lab 08/29/21 ?0218  ?ALBUMIN 2.9*  ? ?CBC: ?Recent Labs  ?Lab 08/28/21 ?1840 08/29/21 ?0218  ?WBC 5.2 7.0  ?NEUTROABS 2.9  --   ?HGB 12.8 10.6*  ?HCT 40.1 33.0*  ?MCV 92.4 92.2  ?PLT 172 151  ? ?CBG: ?Recent Labs  ?Lab 08/30/21 ?1204 08/30/21 ?1606 08/30/21 ?2117 08/31/21 ?7858 08/31/21 ?8502  ?GLUCAP 178* 175* 128* 148* 157*  ? ?Studies/Results: ?DG HIP UNILAT WITH PELVIS 2-3 VIEWS RIGHT ? ?Result Date: 08/29/2021 ?CLINICAL DATA:  Pain. EXAM: DG HIP (WITH OR WITHOUT PELVIS) 2-3V RIGHT COMPARISON:  None Available. FINDINGS: There is mild right hip joint space narrowing. The femoral head is well seated. Mild spurring of the acetabulum. No evidence of pelvis or right hip fracture. No erosive change or bone destruction. No radiographic evidence of avascular necrosis. Pubic symphysis and sacroiliac joints are congruent, both with degenerative change. Technically limited exam due to soft tissue attenuation from habitus. Peripheral vascular calcifications are  seen. IMPRESSION: Mild osteoarthritis of the right hip. Electronically Signed   By: Keith Rake M.D.   On: 08/29/2021 17:01   ? ?Medications: ? ? amLODipine  10 mg Oral q AM  ? apixaban  5 mg Oral BID  ? Chlorhexidine Gluconate Cloth  6 each Topical Q0600  ? Chlorhexidine Gluconate Cloth  6 each Topical Q0600  ? doxercalciferol  1 mcg Intravenous Q Wed-HD  ? DULoxetine  60 mg Oral q AM  ? hydrALAZINE  50 mg Oral TID  ? insulin aspart  0-6 Units Subcutaneous TID WC  ? isosorbide mononitrate  30 mg Oral q AM  ? lidocaine  1 patch Transdermal Q24H  ? metoprolol tartrate  12.5 mg Oral BID  ? pantoprazole  20 mg Oral q AM  ? pravastatin  40 mg Oral q AM  ? rOPINIRole  0.25 mg Oral Daily  ? ? ?Dialysis Orders: ?MWF [place] ? 4h  400/500  80.5kg  3K/2.5Ca bath  RUA AVF  Hep 2000 ? - last HD 4/26, 80.9 > 80.5 ? - last Hb 11.1 on 4/24 ? - mircera 30 ug q2wks, last 4/3, due 4/17 ? - venofer 50 weekly on wed ? - hectorol 1 ug once per week, last 4/26, q wed ?  ?Assessment/Plan: ?Generalized weakness/ falls: Deconditioning and chronic back  pain problem. PT following ?ESRD - on HD MWF. Last HD prior to admission was 4/26, missed 4 sessions. Labs are pretty good (creat 4s) for missing that much HD. Still makes urine. Not grossly uremic, although gen'd weakness could be a symptom. Will see if she improves w/ dialysis. On HD x 1 year, plan was for 24-hr urine, looks like not much UOP here. Continue HD for now, HD today.  ?Chronic back pain- management per primary ?HTN - BP controlled, continue home meds ?Volume - no edema, below prior EDW. Minimal UF today. 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 Not taking a binder, continue to monitor trend ?  ? ?Veneta Penton, PA-C ?08/31/2021, 10:44 AM  ?[facility] ? ? ? ?

## 2021-08-31 NOTE — Discharge Instructions (Signed)
Information on my medicine - ELIQUIS? (apixaban) ? ?Why was Eliquis? prescribed for you? ?Eliquis? was prescribed to treat blood clots that may have been found in the veins of your legs (deep vein thrombosis) in the past and to reduce the risk of them occurring again. ? ?What do You need to know about Eliquis? ? ?Your dose is ONE 5 mg tablet taken TWICE daily.  Eliquis? may be taken with or without food.  ? ?Try to take the dose about the same time in the morning and in the evening. If you have difficulty swallowing the tablet whole please discuss with your pharmacist how to take the medication safely. ? ?Take Eliquis? exactly as prescribed and DO NOT stop taking Eliquis? without talking to the doctor who prescribed the medication.  Stopping may increase your risk of developing a new blood clot.  Refill your prescription before you run out. ? ?After discharge, you should have regular check-up appointments with your healthcare provider that is prescribing your Eliquis?. ?   ?What do you do if you miss a dose? ?If a dose of ELIQUIS? is not taken at the scheduled time, take it as soon as possible on the same day and twice-daily administration should be resumed. The dose should not be doubled to make up for a missed dose. ? ?Important Safety Information ?A possible side effect of Eliquis? is bleeding. You should call your healthcare provider right away if you experience any of the following: ?Bleeding from an injury or your nose that does not stop. ?Unusual colored urine (red or dark brown) or unusual colored stools (red or black). ?Unusual bruising for unknown reasons. ?A serious fall or if you hit your head (even if there is no bleeding). ? ?Some medicines may interact with Eliquis? and might increase your risk of bleeding or clotting while on Eliquis?Marland Kitchen To help avoid this, consult your healthcare provider or pharmacist prior to using any new prescription or non-prescription medications, including herbals, vitamins,  non-steroidal anti-inflammatory drugs (NSAIDs) and supplements. ? ?This website has more information on Eliquis? (apixaban): http://www.eliquis.com/eliquis/home  ?

## 2021-08-31 NOTE — TOC Transition Note (Signed)
Transition of Care (TOC) - CM/SW Discharge Note ? ? ?Patient Details  ?Name: Ann [name] ?MRN: [id number] ?Date of Birth: 12/20/1950 ? ?Transition of Care (TOC) CM/SW Contact:  ?Benard Halsted, LCSW ?Phone Number: ?08/31/2021, 2:32 PM ? ? ?Clinical Narrative:    ?Patient will DC to: [facility] ?Anticipated DC date: 08/31/21 ?Family notified: Daughter, [name] ?Transport by: [name] ? ? ?Per MD patient ready for DC to [facility]. RN to call report prior to discharge [phone]). RN, patient, patient's family, and facility notified of DC. Discharge Summary and FL2 sent to facility. DC packet on chart. Ambulance transport requested for patient.  ? ?CSW will sign off for now as social work intervention is no longer needed. Please consult [place] again if new needs arise. ? ? ? ? ?Final next level of care: [place] ?Barriers to Discharge: Barriers Resolved ? ? ?Patient Goals and CMS Choice ?Patient states their goals for this hospitalization and ongoing recovery are:: Rehab ?CMS Medicare.gov Compare Post Acute Care list provided to:: Patient ?Choice offered to / list presented to : Patient, Adult Children ? ?Discharge Placement ?  ?Existing PASRR number confirmed : 08/31/21          ?Patient chooses bed at: [facility] ?Patient to be transferred to facility by: [facility] ?Name of family member notified: Daughter ?Patient and family notified of of transfer: 08/31/21 ? ?Discharge Plan and Services ?In-house Referral: Clinical Social Work ?  ?Post Acute Care Choice: [place]          ?  ?  ?  ?  ?  ?  ?  ?  ?  ?  ? ?Social Determinants of Health (SDOH) Interventions ?  ? ? ?Readmission Risk Interventions ? ?  02/23/2019  ? 10:29 AM  ?Readmission Risk Prevention Plan  ?Transportation Screening Complete  ?PCP or Specialist Appt within 5-7 Days Complete  ?Home Care Screening Not Complete  ?Home Care Screening Not Completed Comments NA  ?Medication Review (RN CM) Not Complete  ?Med Review  comments NA  ? ? ? ? ? ?

## 2021-08-31 NOTE — Procedures (Signed)
Arrived to unit with BP of 138/58, no complaints noted from pt.  Fistula easily cannulated by CCHT.  Pt with stable vital signs throughout treatment.  Following treatment, Decannulated and pressure held until hemostasis occurred.  Ending BP 126/52 Net UF goal achieved is 237ms. Hectorol given.  ?

## 2021-08-31 NOTE — Progress Notes (Signed)
Physical Therapy Treatment ?Patient Details ?Name: Ann [name] ?MRN: [id number] ?DOB: 1951-01-24 ?Today's Date: 08/31/2021 ? ? ?History of Present Illness Pt is a 71 y/o female admitted 08/28/21 secondary to multiple falls and weakness. PMH includes HTN, DM, CAD, OSA on CPAP, ESRD on HD. ? ?  ?PT Comments  ? ? Pt was able to progress to ambulating around the EOB from one side to the other (~14 ft) with a rollator and minA today. However, she remains limited in mobility by R hip/low back "sciatica" pain and reported fear of falling. Secondary to these limitations, she required >5 min to transition to sitting EOB from supine and >5 min to transfer sit > stand once sitting EOB. Pt limited in further progression of gait by transport arriving for HD treatment, thus pt returned to supine. Pt reporting her granddaughter is physically limited in assisting her at home due to her own back pain at this time and they are trying to set-up [facility] aide services. Due to limited assistance available at home, pt's high risk for subsequent falls, and need for physical assistance, continue to recommend short-term rehab at a SNF. Will continue to follow acutely. ? ?   ?Recommendations for follow up therapy are one component of a multi-disciplinary discharge planning process, led by the attending physician.  Recommendations may be updated based on patient status, additional functional criteria and insurance authorization. ? ?Follow Up Recommendations ? Skilled nursing-short term rehab (<3 hours/day) ?  ?  ?Assistance Recommended at Discharge Frequent or constant Supervision/Assistance  ?Patient can return home with the following A little help with bathing/dressing/bathroom;Assistance with cooking/housework;Help with stairs or ramp for entrance;Assist for transportation;A little help with walking and/or transfers ?  ?Equipment Recommendations ? None recommended by PT  ?  ?Recommendations for Other Services   ? ? ?  ?Precautions / Restrictions  Precautions ?Precautions: Fall ?Precaution Comments: painful R hip/"sciatica" ?Restrictions ?Weight Bearing Restrictions: No  ?  ? ?Mobility ? Bed Mobility ?Overal bed mobility: Needs Assistance ?Bed Mobility: Rolling, Sidelying to Sit, Sit to Supine ?Rolling: Min guard ?Sidelying to sit: Min assist, HOB elevated ?  ?Sit to supine: Mod assist ?  ?General bed mobility comments: Extra time and encouragement for all mobility, cuing pt to roll to reduce back pain. Min guard with use of rail to roll. MinA to initiate ascension of trunk from elevated HOB using bed rail. ModA to lift legs and direct trunk with return to supine. ?  ? ?Transfers ?Overall transfer level: Needs assistance ?Equipment used: Rollator (4 wheels) ?Transfers: Sit to/from Stand ?Sit to Stand: Min assist ?  ?  ?  ?  ?  ?General transfer comment: EOB elevated slightly for reduced pain in buttocks with trunk flexion, minA to power up to stand with cues for hand placement. Pt maintains flexed posture with forearms on rollator hand grips once standing due to pain. Pt needing >5 min to work up to complete the transfer once sitting EOB. ?  ? ?Ambulation/Gait ?Ambulation/Gait assistance: Min assist ?Gait Distance (Feet): 14 Feet ?Assistive device: Rollator (4 wheels) ?Gait Pattern/deviations: Step-through pattern, Decreased stride length, Shuffle, Trunk flexed ?Gait velocity: reduced ?Gait velocity interpretation: <1.31 ft/sec, indicative of household ambulator ?  ?General Gait Details: Pt with flexed posture and resting with forearms on rollator grips due to back pain. Pt with slow, unsteady shuffling steps, minA for stability and safety. Pt ambulating around EOB for safety. Limited distance due to transport arriving for HD. ? ? ?Stairs ?  ?  ?  ?  ?  ? ? ?  Wheelchair Mobility ?  ? ?Modified Rankin (Stroke Patients Only) ?  ? ? ?  ?Balance Overall balance assessment: Needs assistance ?Sitting-balance support: Feet supported, Bilateral upper extremity  supported, No upper extremity supported ?Sitting balance-Leahy Scale: Fair ?Sitting balance - Comments: Prefers UE support due to back pain. ?  ?Standing balance support: Bilateral upper extremity supported, During functional activity ?Standing balance-Leahy Scale: Poor ?Standing balance comment: Reliant on rollator and up to minA ?  ?  ?  ?  ?  ?  ?  ?  ?  ?  ?  ?  ? ?  ?Cognition Arousal/Alertness: Awake/alert ?Behavior During Therapy: [facility] for tasks assessed/performed ?Overall Cognitive Status: No family/caregiver present to determine baseline cognitive functioning ?  ?  ?  ?  ?  ?  ?  ?  ?  ?  ?  ?  ?  ?  ?  ?  ?General Comments: overall WFL - would benefit from further executive functioning assessment; self-limited by pain and reported fear of falling ?  ?  ? ?  ?Exercises   ? ?  ?General Comments General comments (skin integrity, edema, etc.): VSS on RA; encouraged pt to get OOB to chair after HD; discussed family support with pt reporting her granddaughter is having back issues and cannot physically assist her currently with limited other options for support ?  ?  ? ?Pertinent Vitals/Pain Pain Assessment ?Pain Assessment: Faces ?Faces Pain Scale: Hurts even more ?Pain Location: low back, R hip/buttocks ?Pain Descriptors / Indicators: Guarding, Moaning, Shooting ?Pain Intervention(s): Limited activity within patient's tolerance, Monitored during session, Repositioned  ? ? ?Home Living   ?  ?  ?  ?  ?  ?  ?  ?  ?  ?   ?  ?Prior Function    ?  ?  ?   ? ?PT Goals (current goals can now be found in the care plan section) Acute Rehab PT Goals ?Patient Stated Goal: to decrease pain ?PT Goal Formulation: With patient ?Time For Goal Achievement: 09/12/21 ?Potential to Achieve Goals: Fair ?Progress towards PT goals: Progressing toward goals ? ?  ?Frequency ? ? ? Min 3X/week ? ? ? ?  ?PT Plan Current plan remains appropriate  ? ? ?Co-evaluation   ?  ?  ?  ?  ? ?  ?AM-PAC PT "6 Clicks" Mobility   ?Outcome Measure ? Help  needed turning from your back to your side while in a flat bed without using bedrails?: A Little ?Help needed moving from lying on your back to sitting on the side of a flat bed without using bedrails?: A Little ?Help needed moving to and from a bed to a chair (including a wheelchair)?: A Little ?Help needed standing up from a chair using your arms (e.g., wheelchair or bedside chair)?: A Little ?Help needed to walk in hospital room?: A Lot ?Help needed climbing 3-5 steps with a railing? : Total ?6 Click Score: 15 ? ?  ?End of Session Equipment Utilized During Treatment: Gait belt ?Activity Tolerance: Patient limited by pain ?Patient left: with call bell/phone within reach;in bed;with bed alarm set;with nursing/sitter in room ?Nurse Communication: Mobility status ?PT Visit Diagnosis: Unsteadiness on feet (R26.81);Repeated falls (R29.6);Muscle weakness (generalized) (M62.81);History of falling (Z91.81);Other abnormalities of gait and mobility (R26.89);Difficulty in walking, not elsewhere classified (R26.2);Pain ?Pain - Right/Left: Right ?Pain - part of body: Hip (back) ?  ? ? ?Time: 2119-4174 ?PT Time Calculation (min) (ACUTE ONLY): 22 min ? ?Charges:  $Therapeutic  Activity: 8-22 mins          ?          ? ?Moishe Spice, PT, DPT ?Acute Rehabilitation Services  ?Pager: [phone] ?Office: [phone] ? ? ? ?Ann [name] ?08/31/2021, 8:37 AM ? ?

## 2021-08-31 NOTE — Progress Notes (Signed)
Attempted to give report to [facility], nobody was able to take report at this time. ?

## 2021-08-31 NOTE — Progress Notes (Signed)
Attempted to get in touch with the nephrologist twice this morning about 24 hrs urine collection not obtainable from pt, but no return call from the nephrologist. Dr. Doristine Bosworth aware. ?

## 2021-09-23 ENCOUNTER — Ambulatory Visit: Payer: 59 | Admitting: Podiatry

## 2021-11-22 ENCOUNTER — Ambulatory Visit (INDEPENDENT_AMBULATORY_CARE_PROVIDER_SITE_OTHER): Payer: 59 | Admitting: Podiatry

## 2021-11-22 DIAGNOSIS — Z91199 Patient's noncompliance with other medical treatment and regimen due to unspecified reason: Secondary | ICD-10-CM

## 2021-11-22 NOTE — Progress Notes (Signed)
   Complete physical exam  Patient: Ann [name]   DOB: 02/11/2000   71 y.o. Female  MRN: [id number]  Subjective:    No chief complaint on file.   Ann [name] is a 71 y.o. female who presents today for a complete physical exam. She reports consuming a {diet types:17450} diet. {types:19826} She generally feels {DESC; WELL/FAIRLY WELL/POORLY:18703}. She reports sleeping {DESC; WELL/FAIRLY WELL/POORLY:18703}. She {does/does not:200015} have additional problems to discuss today.    Most recent fall risk assessment:    10/19/2021   10:42 AM  Fall Risk   Falls in the past year? 0  Number falls in past yr: 0  Injury with Fall? 0  Risk for fall due to : No Fall Risks  Follow up Falls evaluation completed     Most recent depression screenings:    10/19/2021   10:42 AM 09/09/2020   10:46 AM  PHQ 2/9 Scores  PHQ - 2 Score 0 0  PHQ- 9 Score 5     {VISON DENTAL STD PSA (Optional):27386}  {History (Optional):23778}  Patient Care Team: Jessup, Joy, NP as PCP - General (Nurse Practitioner)   Outpatient Medications Prior to Visit  Medication Sig   fluticasone (FLONASE) 50 MCG/ACT nasal spray Place 2 sprays into both nostrils in the morning and at bedtime. After 7 days, reduce to once daily.   norgestimate-ethinyl estradiol (SPRINTEC 28) 0.25-35 MG-MCG tablet Take 1 tablet by mouth daily.   Nystatin POWD Apply liberally to affected area 2 times per day   spironolactone (ALDACTONE) 100 MG tablet Take 1 tablet (100 mg total) by mouth daily.   No facility-administered medications prior to visit.    ROS        Objective:     There were no vitals taken for this visit. {Vitals History (Optional):23777}  Physical Exam   No results found for any visits on 11/24/21. {Show previous labs (optional):23779}    Assessment & Plan:    Routine Health Maintenance and Physical Exam  Immunization History  Administered Date(s) Administered   DTaP 04/27/1999, 06/23/1999,  09/01/1999, 05/17/2000, 12/01/2003   Hepatitis A 09/27/2007, 10/02/2008   Hepatitis B 02/12/1999, 03/22/1999, 09/01/1999   HiB (PRP-OMP) 04/27/1999, 06/23/1999, 09/01/1999, 05/17/2000   IPV 04/27/1999, 06/23/1999, 02/20/2000, 12/01/2003   Influenza,inj,Quad PF,6+ Mos 01/02/2014   Influenza-Unspecified 04/03/2012   MMR 02/19/2001, 12/01/2003   Meningococcal Polysaccharide 10/02/2011   Pneumococcal Conjugate-13 05/17/2000   Pneumococcal-Unspecified 09/01/1999, 11/15/1999   Tdap 10/02/2011   Varicella 02/20/2000, 09/27/2007    Health Maintenance  Topic Date Due   HIV Screening  Never done   Hepatitis C Screening  Never done   INFLUENZA VACCINE  11/22/2021   PAP-Cervical Cytology Screening  11/24/2021 (Originally 02/11/2020)   PAP SMEAR-Modifier  11/24/2021 (Originally 02/11/2020)   TETANUS/TDAP  11/24/2021 (Originally 10/01/2021)   HPV VACCINES  Discontinued   COVID-19 Vaccine  Discontinued    Discussed health benefits of physical activity, and encouraged her to engage in regular exercise appropriate for her age and condition.  Problem List Items Addressed This Visit   None Visit Diagnoses     Annual physical exam    -  Primary   Cervical cancer screening       Need for Tdap vaccination          No follow-ups on file.     Joy Jessup, NP   

## 2022-01-12 ENCOUNTER — Encounter: Payer: Self-pay | Admitting: Gastroenterology

## 2022-02-01 ENCOUNTER — Inpatient Hospital Stay (HOSPITAL_COMMUNITY)
Admission: EM | Admit: 2022-02-01 | Discharge: 2022-02-18 | DRG: 811 | Disposition: A | Discharge status: Home Health | Payer: 59 | Source: Skilled Nursing Facility | Attending: Family Medicine | Admitting: Family Medicine

## 2022-02-01 ENCOUNTER — Emergency Department (HOSPITAL_COMMUNITY): Payer: 59

## 2022-02-01 DIAGNOSIS — Z880 Allergy status to penicillin: Secondary | ICD-10-CM

## 2022-02-01 DIAGNOSIS — D649 Anemia, unspecified: Secondary | ICD-10-CM | POA: Diagnosis not present

## 2022-02-01 DIAGNOSIS — K219 Gastro-esophageal reflux disease without esophagitis: Secondary | ICD-10-CM | POA: Diagnosis present

## 2022-02-01 DIAGNOSIS — Z79899 Other long term (current) drug therapy: Secondary | ICD-10-CM | POA: Diagnosis not present

## 2022-02-01 DIAGNOSIS — Z1152 Encounter for screening for COVID-19: Secondary | ICD-10-CM | POA: Diagnosis not present

## 2022-02-01 DIAGNOSIS — E785 Hyperlipidemia, unspecified: Secondary | ICD-10-CM | POA: Diagnosis present

## 2022-02-01 DIAGNOSIS — I5033 Acute on chronic diastolic (congestive) heart failure: Secondary | ICD-10-CM

## 2022-02-01 DIAGNOSIS — W57XXXA Bitten or stung by nonvenomous insect and other nonvenomous arthropods, initial encounter: Secondary | ICD-10-CM

## 2022-02-01 DIAGNOSIS — D631 Anemia in chronic kidney disease: Secondary | ICD-10-CM | POA: Diagnosis present

## 2022-02-01 DIAGNOSIS — Z91040 Latex allergy status: Secondary | ICD-10-CM

## 2022-02-01 DIAGNOSIS — D62 Acute posthemorrhagic anemia: Secondary | ICD-10-CM | POA: Diagnosis present

## 2022-02-01 DIAGNOSIS — I160 Hypertensive urgency: Secondary | ICD-10-CM

## 2022-02-01 DIAGNOSIS — E782 Mixed hyperlipidemia: Secondary | ICD-10-CM | POA: Diagnosis not present

## 2022-02-01 DIAGNOSIS — J45909 Unspecified asthma, uncomplicated: Secondary | ICD-10-CM | POA: Diagnosis present

## 2022-02-01 DIAGNOSIS — Z96653 Presence of artificial knee joint, bilateral: Secondary | ICD-10-CM | POA: Diagnosis present

## 2022-02-01 DIAGNOSIS — Z86718 Personal history of other venous thrombosis and embolism: Secondary | ICD-10-CM | POA: Diagnosis not present

## 2022-02-01 DIAGNOSIS — Z885 Allergy status to narcotic agent status: Secondary | ICD-10-CM | POA: Diagnosis not present

## 2022-02-01 DIAGNOSIS — E669 Obesity, unspecified: Secondary | ICD-10-CM | POA: Diagnosis present

## 2022-02-01 DIAGNOSIS — Z888 Allergy status to other drugs, medicaments and biological substances status: Secondary | ICD-10-CM

## 2022-02-01 DIAGNOSIS — G629 Polyneuropathy, unspecified: Secondary | ICD-10-CM | POA: Diagnosis present

## 2022-02-01 DIAGNOSIS — E1122 Type 2 diabetes mellitus with diabetic chronic kidney disease: Secondary | ICD-10-CM

## 2022-02-01 DIAGNOSIS — Z8249 Family history of ischemic heart disease and other diseases of the circulatory system: Secondary | ICD-10-CM

## 2022-02-01 DIAGNOSIS — G4730 Sleep apnea, unspecified: Secondary | ICD-10-CM | POA: Diagnosis present

## 2022-02-01 DIAGNOSIS — Z23 Encounter for immunization: Secondary | ICD-10-CM

## 2022-02-01 DIAGNOSIS — Z833 Family history of diabetes mellitus: Secondary | ICD-10-CM

## 2022-02-01 DIAGNOSIS — N186 End stage renal disease: Secondary | ICD-10-CM

## 2022-02-01 DIAGNOSIS — I132 Hypertensive heart and chronic kidney disease with heart failure and with stage 5 chronic kidney disease, or end stage renal disease: Secondary | ICD-10-CM | POA: Diagnosis present

## 2022-02-01 DIAGNOSIS — I2510 Atherosclerotic heart disease of native coronary artery without angina pectoris: Secondary | ICD-10-CM

## 2022-02-01 DIAGNOSIS — M898X9 Other specified disorders of bone, unspecified site: Secondary | ICD-10-CM | POA: Diagnosis present

## 2022-02-01 DIAGNOSIS — Z992 Dependence on renal dialysis: Secondary | ICD-10-CM

## 2022-02-01 DIAGNOSIS — F32A Depression, unspecified: Secondary | ICD-10-CM | POA: Diagnosis present

## 2022-02-01 DIAGNOSIS — Z5919 Other inadequate housing: Secondary | ICD-10-CM

## 2022-02-01 DIAGNOSIS — Z5982 Transportation insecurity: Secondary | ICD-10-CM

## 2022-02-01 DIAGNOSIS — Z91158 Patient's noncompliance with renal dialysis for other reason: Secondary | ICD-10-CM

## 2022-02-01 DIAGNOSIS — I10 Essential (primary) hypertension: Secondary | ICD-10-CM | POA: Diagnosis not present

## 2022-02-01 DIAGNOSIS — G8929 Other chronic pain: Secondary | ICD-10-CM | POA: Diagnosis present

## 2022-02-01 DIAGNOSIS — Z7984 Long term (current) use of oral hypoglycemic drugs: Secondary | ICD-10-CM

## 2022-02-01 DIAGNOSIS — Z7901 Long term (current) use of anticoagulants: Secondary | ICD-10-CM

## 2022-02-01 DIAGNOSIS — Z794 Long term (current) use of insulin: Secondary | ICD-10-CM | POA: Diagnosis not present

## 2022-02-01 DIAGNOSIS — M545 Low back pain, unspecified: Secondary | ICD-10-CM | POA: Diagnosis present

## 2022-02-01 DIAGNOSIS — Z6831 Body mass index (BMI) 31.0-31.9, adult: Secondary | ICD-10-CM

## 2022-02-01 DIAGNOSIS — Z955 Presence of coronary angioplasty implant and graft: Secondary | ICD-10-CM

## 2022-02-01 DIAGNOSIS — Z720 Tobacco use: Secondary | ICD-10-CM

## 2022-02-01 DIAGNOSIS — Z86711 Personal history of pulmonary embolism: Secondary | ICD-10-CM

## 2022-02-01 DIAGNOSIS — I5031 Acute diastolic (congestive) heart failure: Secondary | ICD-10-CM | POA: Diagnosis not present

## 2022-02-01 LAB — BASIC METABOLIC PANEL WITH GFR
Anion gap: 7 (ref 5–15)
BUN: 45 mg/dL — ABNORMAL HIGH (ref 8–23)
CO2: 22 mmol/L (ref 22–32)
Calcium: 8.8 mg/dL — ABNORMAL LOW (ref 8.9–10.3)
Chloride: 114 mmol/L — ABNORMAL HIGH (ref 98–111)
Creatinine, Ser: 4.67 mg/dL — ABNORMAL HIGH (ref 0.44–1.00)
GFR, Estimated: 9 mL/min — ABNORMAL LOW
Glucose, Bld: 124 mg/dL — ABNORMAL HIGH (ref 70–99)
Potassium: 4.6 mmol/L (ref 3.5–5.1)
Sodium: 143 mmol/L (ref 135–145)

## 2022-02-01 LAB — CBC WITH DIFFERENTIAL/PLATELET
Abs Immature Granulocytes: 0.02 10*3/uL (ref 0.00–0.07)
Basophils Absolute: 0 10*3/uL (ref 0.0–0.1)
Basophils Relative: 0 %
Eosinophils Absolute: 0.3 10*3/uL (ref 0.0–0.5)
Eosinophils Relative: 6 %
HCT: 23.3 % — ABNORMAL LOW (ref 36.0–46.0)
Hemoglobin: 7.6 g/dL — ABNORMAL LOW (ref 12.0–15.0)
Immature Granulocytes: 0 %
Lymphocytes Relative: 26 %
Lymphs Abs: 1.2 10*3/uL (ref 0.7–4.0)
MCH: 31 pg (ref 26.0–34.0)
MCHC: 32.6 g/dL (ref 30.0–36.0)
MCV: 95.1 fL (ref 80.0–100.0)
Monocytes Absolute: 0.4 10*3/uL (ref 0.1–1.0)
Monocytes Relative: 9 %
Neutro Abs: 2.7 10*3/uL (ref 1.7–7.7)
Neutrophils Relative %: 59 %
Platelets: 169 10*3/uL (ref 150–400)
RBC: 2.45 MIL/uL — ABNORMAL LOW (ref 3.87–5.11)
RDW: 14.6 % (ref 11.5–15.5)
WBC: 4.6 10*3/uL (ref 4.0–10.5)
nRBC: 0 % (ref 0.0–0.2)

## 2022-02-01 LAB — CBG MONITORING, ED: Glucose-Capillary: 107 mg/dL — ABNORMAL HIGH (ref 70–99)

## 2022-02-01 LAB — PREPARE RBC (CROSSMATCH)

## 2022-02-01 LAB — POC OCCULT BLOOD, ED: Fecal Occult Bld: NEGATIVE

## 2022-02-01 MED ORDER — SODIUM CHLORIDE 0.9 % IV SOLN
10.0000 mL/h | Freq: Once | INTRAVENOUS | Status: AC
  Administered 2022-02-02: 10 mL/h via INTRAVENOUS

## 2022-02-01 MED ORDER — INSULIN ASPART 100 UNIT/ML IJ SOLN
0.0000 [IU] | Freq: Every day | INTRAMUSCULAR | Status: DC
  Administered 2022-02-10 – 2022-02-16 (×4): 2 [IU] via SUBCUTANEOUS

## 2022-02-01 MED ORDER — INSULIN ASPART 100 UNIT/ML IJ SOLN
0.0000 [IU] | Freq: Three times a day (TID) | INTRAMUSCULAR | Status: DC
  Administered 2022-02-02 – 2022-02-04 (×2): 1 [IU] via SUBCUTANEOUS

## 2022-02-01 MED ORDER — HYDRALAZINE HCL 20 MG/ML IJ SOLN
10.0000 mg | Freq: Once | INTRAMUSCULAR | Status: AC
  Administered 2022-02-02: 10 mg via INTRAVENOUS
  Filled 2022-02-01: qty 1

## 2022-02-01 NOTE — Subjective & Objective (Signed)
Comes back from home for malaise fatigue for quite some time Patient missed dialysis treatments because she has bedbugs and transport would not take her Denies any chest pain no fevers just tired and short of breath

## 2022-02-01 NOTE — Assessment & Plan Note (Signed)
-   Order Sensitive  SSI     -  check TSH and HgA1C  - Hold by mouth medications*  

## 2022-02-01 NOTE — Assessment & Plan Note (Addendum)
Continue Eliquis 5 mg p.o. twice daily

## 2022-02-01 NOTE — Assessment & Plan Note (Signed)
Continue Pravachol 40 mg p.o. daily

## 2022-02-01 NOTE — Assessment & Plan Note (Signed)
Alerted nephrology of patient's been admitted Needs to resume hemodialysis

## 2022-02-01 NOTE — Assessment & Plan Note (Signed)
Restart Norvasc 10 mg p.o. daily hydralazine 50 mg p.o. 3 times daily and Imdur 30 mg daily as well as Lopressor 25 mg daily

## 2022-02-01 NOTE — Assessment & Plan Note (Signed)
In the setting of likely hemodialysis patient endorsing shortness of breath and dyspnea on exertion.  Repeat echogram.  Nephrology is aware patient does not make urine will need a hemodialysis tomorrow currently no oxygen requirement

## 2022-02-01 NOTE — ED Provider Notes (Signed)
[facility]PART[facility] Provider Note   CSN: [id number] Arrival date & time: 02/01/22  1824     History  Chief Complaint  Patient presents with   Fatigue    Ann [name] is a 71 y.o. female.  HPI   Patient with history of end-stage renal disease dialysis Monday Wednesday Friday due to presents today due to fatigue.  She is missed dialysis last 2 weeks secondary to a bedbug infestation at her nursing home.  Transport will not take her.  She is not having any chest pain or fevers at home.  States he just generally feels tired and malaised. Endorses feeling short of breath.   Home Medications Prior to Admission medications   Medication Sig Start Date End Date Taking? Authorizing Provider  acetaminophen (TYLENOL) 325 MG tablet Take 650 mg by mouth 2 (two) times daily as needed (for back pain).   Yes [provider]  albuterol (VENTOLIN HFA) 108 (90 Base) MCG/ACT inhaler Inhale 2 puffs into the lungs every 4 (four) hours as needed for wheezing or shortness of breath. 05/04/20  Yes Medina-Vargas, Monina C, NP  amLODipine (NORVASC) 10 MG tablet Take 10 mg by mouth in the morning.   Yes [provider]  apixaban (ELIQUIS) 5 MG TABS tablet Take 1 tablet (5 mg total) by mouth 2 (two) times daily. 05/04/20  Yes Medina-Vargas, Monina C, NP  DULoxetine (CYMBALTA) 60 MG capsule Take 1 capsule (60 mg total) by mouth in the morning. 11/19/20  Yes Rehman, Areeg N, DO  hydrALAZINE (APRESOLINE) 50 MG tablet Take 1 tablet (50 mg total) by mouth 3 (three) times daily. 05/04/20  Yes Medina-Vargas, Monina C, NP  HYDROcodone-acetaminophen (NORCO/VICODIN) 5-325 MG tablet Take 1 tablet by mouth every 4 (four) hours as needed for moderate pain. Patient taking differently: Take 1 tablet by mouth every 4 (four) hours as needed for severe pain. 08/31/21 08/31/22 Yes Pahwani, Einar Grad, MD  isosorbide mononitrate (IMDUR) 30 MG 24 hr tablet Take 30 mg by mouth in the morning.   Yes  [provider]  lidocaine-prilocaine (EMLA) cream 1 application daily as needed (port access). 04/08/21  Yes [provider]  linagliptin (TRADJENTA) 5 MG TABS tablet Take 5 mg by mouth in the morning.   Yes [provider]  loratadine (CLARITIN) 10 MG tablet Take 10 mg by mouth daily as needed for allergies. 08/23/21  Yes [provider]  metoprolol tartrate (LOPRESSOR) 25 MG tablet Take 0.5 tablets (12.5 mg total) by mouth in the morning and at bedtime. Patient taking differently: Take 25 mg by mouth daily. 11/19/20  Yes Rehman, Areeg N, DO  pantoprazole (PROTONIX) 20 MG tablet Take 1 tablet (20 mg total) by mouth in the morning. 11/19/20  Yes Rehman, Areeg N, DO  pravastatin (PRAVACHOL) 40 MG tablet Take 1 tablet (40 mg total) by mouth in the morning. 11/20/20  Yes Orvis Brill, MD  rOPINIRole (REQUIP) 0.25 MG tablet Take 0.25 mg by mouth daily. 10/21/20  Yes [provider]  ACCU-CHEK FASTCLIX LANCETS MISC USE AS DIRECTED TO CHECK BLOOD SUGAR FOUR TIMES A DAY(BEFORE MEALS AND AT BEDTIME). 10/14/12   Sid Falcon, MD  ACCU-CHEK SMARTVIEW test strip CHECK BLOOD SUGAR BEFORE MEALS AND AT BEDTIME 10/14/12   Sid Falcon, MD  Insulin Pen Needle (EASY TOUCH PEN NEEDLES) 31G X 8 MM MISC Use to inject insulin as instructed. 02/02/12   Bartholomew Crews, MD  TOUJEO SOLOSTAR 300 UNIT/ML Solostar Pen  Inject 4 Units into the skin at bedtime. Patient not taking: Reported on 05/25/2021 11/19/20   Rehman, Areeg N, DO  atorvastatin (LIPITOR) 40 MG tablet Take 1 tablet (40 mg total) by mouth daily. 11/19/20 11/19/20  Orvis Brill, MD      Allergies    Penicillins, Codeine, Metronidazole, and Latex    Review of Systems   Review of Systems  Physical Exam Updated Vital Signs BP (!) 212/72   Pulse 80   Temp 98.3 F (36.8 C) (Oral)   Resp 18   SpO2 100%  Physical Exam Vitals and nursing note reviewed. Exam conducted with a chaperone present.   Constitutional:      Appearance: Normal appearance.     Comments: Bedbugs are  jumping off of the patient.  HENT:     Head: Normocephalic and atraumatic.  Eyes:     General: No scleral icterus.       Right eye: No discharge.        Left eye: No discharge.     Extraocular Movements: Extraocular movements intact.     Pupils: Pupils are equal, round, and reactive to light.  Cardiovascular:     Rate and Rhythm: Normal rate and regular rhythm.     Pulses: Normal pulses.     Heart sounds: Normal heart sounds. No murmur heard.    No friction rub. No gallop.  Pulmonary:     Effort: Pulmonary effort is normal. No respiratory distress.     Breath sounds: Normal breath sounds.  Abdominal:     General: Abdomen is flat. Bowel sounds are normal. There is no distension.     Palpations: Abdomen is soft.     Tenderness: There is no abdominal tenderness.  Genitourinary:    Comments: Rectal exam performed with female chaperone in room.  External hemorrhoids, no active bleeding.  Do not appreciate any melenic stool, do not palpate any internal mass or hemorrhoids. Skin:    General: Skin is warm and dry.     Coloration: Skin is not jaundiced.     Comments: AV fistula with palpable thrill right upper extremity  Neurological:     Mental Status: She is alert. Mental status is at baseline.     Coordination: Coordination normal.     ED Results / Procedures / Treatments   Labs (all labs ordered are listed, but only abnormal results are displayed) Labs Reviewed  CBC WITH DIFFERENTIAL/PLATELET - Abnormal; Notable for the following components:      Result Value   RBC 2.45 (*)    Hemoglobin 7.6 (*)    HCT 23.3 (*)    All other components within normal limits  BASIC METABOLIC PANEL - Abnormal; Notable for the following components:   Chloride 114 (*)    Glucose, Bld 124 (*)    BUN 45 (*)    Creatinine, Ser 4.67 (*)    Calcium 8.8 (*)    GFR, Estimated 9 (*)    All other components within normal  limits  CBG MONITORING, ED - Abnormal; Notable for the following components:   Glucose-Capillary 107 (*)    All other components within normal limits  RESP PANEL BY RT-PCR (FLU A&B, COVID) [id number]  OCCULT BLOOD X 1 CARD TO LAB, STOOL  POC OCCULT BLOOD, ED  TYPE AND SCREEN  PREPARE RBC (CROSSMATCH)    EKG EKG Interpretation  Date/Time:  Wednesday February 01 2022 18:41:02 EDT Ventricular Rate:  80 PR Interval:  143 QRS Duration: 104 QT  Interval:  404 QTC Calculation: 466 R Axis:   44 Text Interpretation: Sinus rhythm RSR' in V1 or V2, right VCD or RVH Confirmed by Margaretmary Eddy [id number]) on 02/01/2022 6:51:41 PM  Radiology DG Chest Portable 1 View  Result Date: 02/01/2022 CLINICAL DATA:  Fatigue EXAM: PORTABLE CHEST 1 VIEW COMPARISON:  Previous studies including the examination of 05/25/2021 FINDINGS: Transverse diameter of heart is slightly increased. Thoracic aorta is tortuous and ectatic. Lung fields are clear of any infiltrates or pulmonary edema. There is no pleural effusion or pneumothorax. IMPRESSION: No active disease. Electronically Signed   By: Elmer Picker M.D.   On: 02/01/2022 18:52    Procedures Procedures    Medications Ordered in ED Medications  0.9 %  sodium chloride infusion (has no administration in time range)  hydrALAZINE (APRESOLINE) injection 10 mg (has no administration in time range)  insulin aspart (novoLOG) injection 0-5 Units (has no administration in time range)  insulin aspart (novoLOG) injection 0-6 Units (has no administration in time range)    ED Course/ Medical Decision Making/ A&P Clinical Course as of 02/01/22 2321  Wed Feb 01, 2022  2147 I reevaluated the patient is resting comfortably.  Patient denies any black and tarry stool, blood in her stool.  Not having any abdominal pain.  History of previous anemia requiring transfusion. [HS]  2220 Occult blood card to lab, stool Negative  [HS]    Clinical Course User Index [HS] Sherrill Raring, PA-C                           Medical Decision Making Amount and/or Complexity of Data Reviewed Labs: ordered. Decision-making details documented in ED Course. Radiology: ordered.  Risk Prescription drug management. Decision regarding hospitalization.   Patient presents due to fatigue after missing dialysis for 2 weeks.  Differential includes but not limited to metabolic abnormality, electrolyte derangement, arrhythmia, pneumonia, COVID, hypoglycemia.  Patient is nonseptic appearing on exam.  Does not meet SIRS criteria and is not hypoxic.  I ordered, viewed and interpreted laboratory work-up. -CBC shows anemia 7.6.  This is a 3 unit decline from 5 months ago, her baseline seems to be 10-11.  No leukocytosis. -BMP without gross electrolyte derangement.  Potassium is normal, calcium is mildly decreased at 8.8.  Creatinine is 4.67 which has been baseline per chart review.  I ordered EKG which shows sinus rhythm   I ordered and viewed chest x-ray which is negative for any acute process.  I reevaluated the patient, she is very fatigued.  Initially said considered sending the patient home given she does not meet any emergent criteria for emergent dialysis.  However, she is very symptomatic and fatigued which I think is secondary to anemia.  Fecal occult was negative.  Hemoglobin has been downtrending over the last few months, 3 unit drop in the last 5 months.  Hemoglobin  Date Value Ref Range Status  02/01/2022 7.6 (L) 12.0 - 15.0 g/dL Final  08/29/2021 10.6 (L) 12.0 - 15.0 g/dL Final  08/28/2021 12.8 12.0 - 15.0 g/dL Final  05/25/2021 11.0 (L) 12.0 - 15.0 g/dL Final    I consulted hospitalist for admission.  I consulted nephrologist Dr. Joelyn Oms, they will arrange dialysis during admission.         Final Clinical Impression(s) / ED Diagnoses Final diagnoses:  Symptomatic anemia  Hypertensive urgency    Rx / DC Orders ED Discharge Orders     None  Sherrill Raring, PA-C 02/01/22 2321    Fransico Meadow, MD 02/02/22 [phone]

## 2022-02-01 NOTE — Assessment & Plan Note (Signed)
Chronic stable resume metoprolol Lopressor 25 mg daily and Pravachol 40 mg daily

## 2022-02-01 NOTE — Assessment & Plan Note (Addendum)
Progressive could be related to anemia of chronic disease Hemoccult negative no complaints of bright red blood per rectum or melena.  Transfuse as needed for hemoglobin below 7 Check anemia panel

## 2022-02-01 NOTE — ED Triage Notes (Signed)
Pt arrives via [facility] from home for malaise. Pt had no specific complaints with EMS, just fatigue and malaise for quite some time. Pt has missed 6 dialysis treatments due to pt having bed bugs in her home and transport will not take her.   VSS with EMS.

## 2022-02-01 NOTE — H&P (Signed)
Ann [name] IEP:[id number] DOB: [birth year] DOA: 02/01/2022     PCP: Antony Blackbird, MD   Outpatient Specialists:  CARDS:  Dr.BErry NEphrology:     GI  Dr.  Havery Moros     Patient arrived to ER on 02/01/22 at 1824 Referred by Attending Fransico Meadow, MD   Patient coming from:    From facility [facility]  Chief Complaint:  Chief Complaint  Patient presents with   Fatigue    HPI: Ann [name] is a 71 y.o. 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, right VCD or RVH  History of DVT Presented with  fatigue and SOB Comes back from home for malaise fatigue for quite some time Patient missed dialysis treatments because she has bedbugs and transport would not take her Denies any chest pain no fevers just tired and short of breath     Regarding pertinent Chronic problems:     Hyperlipidemia -  on statins Pravachol (pravastatin)  Lipid Panel     Component Value Date/Time   CHOL 149 01/17/2012 1054   TRIG 134 04/11/2020 1032   HDL 35 (L) 01/17/2012 1054   CHOLHDL 4.3 01/17/2012 1054   VLDL 28 01/17/2012 1054   [place] 86 01/17/2012 1054   HTN on Norvasc, hydrazine, lopressor   chronic CHF diastolic   - last echo 6606 Grade II diastolic  dysfunction (pseudonormalization).    CAD  - On Aspirin, statin, betablocker,               DM 2 -  Lab Results  Component Value Date   HGBA1C 6.1 (H) 08/29/2021   on insulin, PO meds only, diet controlled   obesity-   BMI Readings from Last 1 Encounters:  08/31/21 31.85 kg/m            Hx of DVT/PE on - anticoagulation with   Eliquis,   ESrD on HD MWF but have not had for 2 wks  Lab Results  Component Value Date   CREATININE 4.67 (H) 02/01/2022   CREATININE 4.79 (H) 08/29/2021   CREATININE 4.93 (H) 08/28/2021     Chronic anemia - baseline hg Hemoglobin & Hematocrit  Recent Labs    08/28/21 1840 08/29/21 0218 02/01/22 2120  HGB 12.8 10.6* 7.6*     While in  ER: Clinical Course as of 02/01/22 2252  Wed Feb 01, 2022  2147 I reevaluated the patient is resting comfortably.  Patient denies any black and tarry stool, blood in her stool.  Not having any abdominal pain.  History of previous anemia requiring transfusion. [HS]  2220 Occult blood card to lab, stool Negative  [HS]    Clinical Course User Index [HS] Sherrill Raring, PA-C        CXR -  NON acute    Following Medications were ordered in ER: Medications  0.9 %  sodium chloride infusion (has no administration in time range)    _______________________________________________________ ER Provider Called:  Nephrology   Dr.Sanford They Recommend admit to medicine   Will see in AM    ED Triage Vitals [02/01/22 1841]  Enc Vitals Group     BP (!) 179/80     Pulse Rate 80     Resp 16     Temp 98.3 F (36.8 C)     Temp Source Oral     SpO2 100 %     Weight      Height  Head Circumference      Peak Flow      Pain Score      Pain Loc      Pain Edu?      Excl. in [place]?   BDZH(29)@     _________________________________________ Significant initial  Findings: Abnormal Labs Reviewed  CBC WITH DIFFERENTIAL/PLATELET - Abnormal; Notable for the following components:      Result Value   RBC 2.45 (*)    Hemoglobin 7.6 (*)    HCT 23.3 (*)    All other components within normal limits  BASIC METABOLIC PANEL - Abnormal; Notable for the following components:   Chloride 114 (*)    Glucose, Bld 124 (*)    BUN 45 (*)    Creatinine, Ser 4.67 (*)    Calcium 8.8 (*)    GFR, Estimated 9 (*)    All other components within normal limits     ECG: Ordered Personally reviewed and interpreted by me showing: HR : 80 Rhythm:  NSR,   RSR' in V1 or V2, right VCD or RVH  QTC 466     The recent clinical data is shown below. Vitals:   02/01/22 2000 02/01/22 2015 02/01/22 2030 02/01/22 2245  BP: (!) 164/71 (!) 180/63 (!) 191/74 (!) 212/72  Pulse: 78 75 81 80  Resp: '12 15 14 18  '$ Temp:       TempSrc:      SpO2: 100% 100% 100% 100%      WBC     Component Value Date/Time   WBC 4.6 02/01/2022 2120   LYMPHSABS 1.2 02/01/2022 2120   MONOABS 0.4 02/01/2022 2120   EOSABS 0.3 02/01/2022 2120   BASOSABS 0.0 02/01/2022 2120    _______________________________________________ Hospitalist was called for admission for   Symptomatic anemia     The following Work up has been ordered so far:  Orders Placed This Encounter  Procedures   Resp Panel by RT-PCR (Flu A&B, Covid) Anterior Nasal Swab   DG Chest Portable 1 View   CBC with Differential   Basic metabolic panel   Occult blood card to lab, stool   Informed Consent Details: Physician/Practitioner Attestation; Transcribe to consent form and obtain patient signature   Consult for [facility] Admission   POC CBG, ED   ED EKG   EKG 12-Lead   Type and screen [place]   Prepare RBC (crossmatch)     OTHER Significant initial  Findings:  labs showing:    Recent Labs  Lab 02/01/22 2120  NA 143  K 4.6  CO2 22  GLUCOSE 124*  BUN 45*  CREATININE 4.67*  CALCIUM 8.8*    Cr  stable,   Lab Results  Component Value Date   CREATININE 4.67 (H) 02/01/2022   CREATININE 4.79 (H) 08/29/2021   CREATININE 4.93 (H) 08/28/2021    No results for input(s): "AST", "ALT", "ALKPHOS", "BILITOT", "PROT", "ALBUMIN" in the last 168 hours. Lab Results  Component Value Date   CALCIUM 8.8 (L) 02/01/2022   PHOS 3.4 08/29/2021       Plt: Lab Results  Component Value Date   PLT 169 02/01/2022       COVID-19 Labs  No results for input(s): "DDIMER", "FERRITIN", "LDH", "CRP" in the last 72 hours.  Lab Results  Component Value Date   SARSCOV2NAA NEGATIVE 11/17/2020   SARSCOV2NAA NEGATIVE 08/10/2020   SARSCOV2NAA POSITIVE (A) 04/03/2020   [place] NEGATIVE 02/26/2019         Recent  Labs  Lab 02/01/22 2120  WBC 4.6  NEUTROABS 2.7  HGB 7.6*  HCT 23.3*  MCV 95.1  PLT 169    HG/HCT    Down  from baseline see below    Component Value Date/Time   HGB 7.6 (L) 02/01/2022 2120   HCT 23.3 (L) 02/01/2022 2120   HCT 31.2 (L) 02/21/2019 0018   MCV 95.1 02/01/2022 2120    DM  labs:  HbA1C: Recent Labs    08/29/21 0218  HGBA1C 6.1*       CBG (last 3)  No results for input(s): "GLUCAP" in the last 72 hours.        Cultures:    Component Value Date/Time   SDES BLOOD LEFT ANTECUBITAL 04/11/2020 1023   SPECREQUEST  04/11/2020 1023    BOTTLES DRAWN AEROBIC AND ANAEROBIC Blood Culture adequate volume   CULT  04/11/2020 1023    NO GROWTH 5 DAYS Performed at [facility], [place] [address] [place], [place] [address]    REPTSTATUS 04/16/2020 FINAL 04/11/2020 1023     Radiological Exams on Admission: DG Chest Portable 1 View  Result Date: 02/01/2022 CLINICAL DATA:  Fatigue EXAM: PORTABLE CHEST 1 VIEW COMPARISON:  Previous studies including the examination of 05/25/2021 FINDINGS: Transverse diameter of heart is slightly increased. Thoracic aorta is tortuous and ectatic. Lung fields are clear of any infiltrates or pulmonary edema. There is no pleural effusion or pneumothorax. IMPRESSION: No active disease. Electronically Signed   By: Elmer Picker M.D.   On: 02/01/2022 18:52   _______________________________________________________________________________________________________ Latest  Blood pressure (!) 212/72, pulse 80, temperature 98.3 F (36.8 C), temperature source Oral, resp. rate 18, SpO2 100 %.   Vitals  labs and radiology finding personally reviewed  Review of Systems:    Pertinent positives include:   fatigue,  Constitutional:  No weight loss, night sweats, Fevers, chills, weight loss  HEENT:  No headaches, Difficulty swallowing,Tooth/dental problems,Sore throat,  No sneezing, itching, ear ache, nasal congestion, post nasal drip,  Cardio-vascular:  No chest pain, Orthopnea, PND, anasarca, dizziness, palpitations.no Bilateral lower extremity  swelling  GI:  No heartburn, indigestion, abdominal pain, nausea, vomiting, diarrhea, change in bowel habits, loss of appetite, melena, blood in stool, hematemesis Resp:  no shortness of breath at rest. No dyspnea on exertion, No excess mucus, no productive cough, No non-productive cough, No coughing up of blood.No change in color of mucus.No wheezing. Skin:  no rash or lesions. No jaundice GU:  no dysuria, change in color of urine, no urgency or frequency. No straining to urinate.  No flank pain.  Musculoskeletal:  No joint pain or no joint swelling. No decreased range of motion. No back pain.  Psych:  No change in mood or affect. No depression or anxiety. No memory loss.  Neuro: no localizing neurological complaints, no tingling, no weakness, no double vision, no gait abnormality, no slurred speech, no confusion  All systems reviewed and apart from [place] all are negative _______________________________________________________________________________________________ Past Medical History:   Past Medical History:  Diagnosis Date   Anemia 09/29/2008   anemia of chronic disease   Anginal pain (HCC)    no recent chest pain   Arthritis    Asthma 09/29/2008   CAD (coronary artery disease) 09/29/2008   hx  PCI - [facility]. Stress test 10/2011 nl with EF 58%. [place] card., last cath 2010   Chest pain 02/16/2009   ESRD on hemodialysis [facility])    MWF started HD in early  2022   GERD (gastroesophageal reflux disease)    Hyperlipidemia 10/21/2009   Hypertension 09/29/2008   Insulin dependent diabetes mellitus type IA ([place]) 09/29/2008   Lumbar back pain 02/16/2009   states pain goes down right leg   Myalgia 02/16/2009   Shortness of breath    with exerction   Shoulder pain, left 03/16/2009   occurs at night   Sleep apnea    no cpap      Past Surgical History:  Procedure Laterality Date   ABDOMINAL HYSTERECTOMY     APPENDECTOMY     AV FISTULA PLACEMENT Right 08/12/2020    Procedure: BRACHIOCEPHALIC ARTERIOVENOUS (AV) FISTULA CREATION RIGHT;  Surgeon: Serafina Mitchell, MD;  Location: [facility];  Service: Vascular;  Laterality: Right;   [place] Right 10/14/2020   Procedure: RIGHT ARM SECOND STAGE BRACHIOCEPHALIC FISTULA TRANSPOSITION;  Surgeon: Serafina Mitchell, MD;  Location: [place];  Service: Vascular;  Laterality: Right;   CARDIAC CATHETERIZATION  2008   in [place], [place] stable   CARDIAC CATHETERIZATION  2010   patent stents in mid and distal RCA; high grade disease in prox & mid AV groove LCX& OM; mod disease in prox & mid LAD normal LV FUNCTION--TREATED MEDICALLY     CATARACT EXTRACTION, BILATERAL     CHOLECYSTECTOMY     CORONARY ANGIOPLASTY WITH STENT PLACEMENT  2005   2 vessel PCI at [place].   DOPPLER ECHOCARDIOGRAPHY  1998   EF 60%   IR FLUORO GUIDE CV LINE LEFT  04/21/2020   IR US GUIDE VASC ACCESS LEFT  04/21/2020   TOTAL KNEE ARTHROPLASTY  12/2011   Left   TOTAL KNEE ARTHROPLASTY  11/28/2011   Procedure: TOTAL KNEE ARTHROPLASTY;  Surgeon: Hessie Dibble, MD;  Location: [facility];  Service: Orthopedics;  Laterality: Right;    Social History:  Ambulatory   walker      reports that she has never smoked. Her smokeless tobacco use includes snuff. She reports that she does not drink alcohol and does not use drugs.   Family History:   Family History  Problem Relation Age of Onset   Heart attack Father    Heart disease Sister    Heart disease Brother    Diabetes Mother    Hypertension Mother    Heart disease Mother    Heart attack Mother    Stroke Mother    Colon cancer Neg Hx    ______________________________________________________________________________________________ Allergies: Allergies  Allergen Reactions   Penicillins Anaphylaxis   Codeine Nausea And Vomiting   Metronidazole Itching   Latex Itching    Believes it was powder     Prior to Admission medications   Medication Sig Start Date End Date Taking?  Authorizing Provider  acetaminophen (TYLENOL) 325 MG tablet Take 650 mg by mouth 2 (two) times daily as needed (for back pain).   Yes [provider]  albuterol (VENTOLIN HFA) 108 (90 Base) MCG/ACT inhaler Inhale 2 puffs into the lungs every 4 (four) hours as needed for wheezing or shortness of breath. 05/04/20  Yes Medina-Vargas, Monina C, NP  amLODipine (NORVASC) 10 MG tablet Take 10 mg by mouth in the morning.   Yes [provider]  apixaban (ELIQUIS) 5 MG TABS tablet Take 1 tablet (5 mg total) by mouth 2 (two) times daily. 05/04/20  Yes Medina-Vargas, Monina C, NP  DULoxetine (CYMBALTA) 60 MG capsule Take 1 capsule (60 mg total) by mouth in the morning. 11/19/20  Yes Rehman, Areeg N,  DO  hydrALAZINE (APRESOLINE) 50 MG tablet Take 1 tablet (50 mg total) by mouth 3 (three) times daily. 05/04/20  Yes Medina-Vargas, Monina C, NP  HYDROcodone-acetaminophen (NORCO/VICODIN) 5-325 MG tablet Take 1 tablet by mouth every 4 (four) hours as needed for moderate pain. Patient taking differently: Take 1 tablet by mouth every 4 (four) hours as needed for severe pain. 08/31/21 08/31/22 Yes Pahwani, Einar Grad, MD  isosorbide mononitrate (IMDUR) 30 MG 24 hr tablet Take 30 mg by mouth in the morning.   Yes [provider]  lidocaine-prilocaine (EMLA) cream 1 application daily as needed (port access). 04/08/21  Yes [provider]  linagliptin (TRADJENTA) 5 MG TABS tablet Take 5 mg by mouth in the morning.   Yes [provider]  loratadine (CLARITIN) 10 MG tablet Take 10 mg by mouth daily as needed for allergies. 08/23/21  Yes [provider]  metoprolol tartrate (LOPRESSOR) 25 MG tablet Take 0.5 tablets (12.5 mg total) by mouth in the morning and at bedtime. Patient taking differently: Take 25 mg by mouth daily. 11/19/20  Yes Rehman, Areeg N, DO  pantoprazole (PROTONIX) 20 MG tablet Take 1 tablet (20 mg total) by mouth in the morning. 11/19/20  Yes Rehman, Areeg N, DO   pravastatin (PRAVACHOL) 40 MG tablet Take 1 tablet (40 mg total) by mouth in the morning. 11/20/20  Yes Orvis Brill, MD  rOPINIRole (REQUIP) 0.25 MG tablet Take 0.25 mg by mouth daily. 10/21/20  Yes [provider]  ACCU-CHEK FASTCLIX LANCETS MISC USE AS DIRECTED TO CHECK BLOOD SUGAR FOUR TIMES A DAY(BEFORE MEALS AND AT BEDTIME). 10/14/12   Sid Falcon, MD  ACCU-CHEK SMARTVIEW test strip CHECK BLOOD SUGAR BEFORE MEALS AND AT BEDTIME 10/14/12   Sid Falcon, MD  Insulin Pen Needle (EASY TOUCH PEN NEEDLES) 31G X 8 MM MISC Use to inject insulin as instructed. 02/02/12   Bartholomew Crews, MD  TOUJEO SOLOSTAR 300 UNIT/ML Solostar Pen Inject 4 Units into the skin at bedtime. Patient not taking: Reported on 05/25/2021 11/19/20   Rehman, Areeg N, DO  atorvastatin (LIPITOR) 40 MG tablet Take 1 tablet (40 mg total) by mouth daily. 11/19/20 11/19/20  Orvis Brill, MD    ___________________________________________________________________________________________________ Physical Exam:    02/01/2022   10:45 PM 02/01/2022    8:30 PM 02/01/2022    8:15 PM  Vitals with BMI  Systolic [phone]  Diastolic 72 74 63  Pulse 80 81 75     1. General:  in No  Acute distress   Chronically ill   -appearing 2. Psychological: Alert and   Oriented 3. Head/ENT:   Moist  Mucous Membranes                          Head Non traumatic, neck supple                          Poor Dentition 4. SKIN: normal  Skin turgor,  Skin clean Dry and intact no rash 5. Heart: Regular rate and rhythm no  Murmur, no Rub or gallop 6. Lungs: , no wheezes or crackles   7. Abdomen: Soft,  non-tender, Non distended   obese   8. Lower extremities: no clubbing, cyanosis, no  edema 9. Neurologically  strength 5 out of 5 in all 4 extremities cranial nerves II through XII intact 10. MSK: Normal range of motion  Visible bedbugs on  pt surroundings  Chart has been  reviewed  ______________________________________________________________________________________________  Assessment/Plan 71 y.o. 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, right VCD or RVH  History of DVT   Admitted for   Symptomatic anemia due to bedbugs     Present on Admission:  Symptomatic anemia  Diabetes mellitus with ESRD (end-stage renal disease) ([place])  Hyperlipidemia  HYPERTENSION  CAD (coronary artery disease)  End stage renal disease ([place])  Acute on chronic diastolic CHF (congestive heart failure) ([place])  Acute blood loss anemia     Diabetes mellitus with ESRD (end-stage renal disease) ([place])  - Order Sensitive SSI     -  check TSH and HgA1C  - Hold by mouth medications     Symptomatic anemia Progressive could be related to anemia of chronic disease Hemoccult negative no complaints of bright red blood per rectum or melena.  Transfuse as needed for hemoglobin below 7 Check anemia panel   Hyperlipidemia Continue Pravachol 40 mg p.o. daily  HYPERTENSION Restart Norvasc 10 mg p.o. daily hydralazine 50 mg p.o. 3 times daily and Imdur 30 mg daily as well as Lopressor 25 mg daily  CAD (coronary artery disease) Chronic stable resume metoprolol Lopressor 25 mg daily and Pravachol 40 mg daily  End stage renal disease ([place]) Alerted nephrology of patient's been admitted Needs to resume hemodialysis  History of deep vein thrombosis (DVT) of lower extremity Continue Eliquis 5 mg p.o. twice daily  Acute on chronic diastolic CHF (congestive heart failure) ([place]) In the setting of likely hemodialysis patient endorsing shortness of breath and dyspnea on exertion.  Repeat echogram.  Nephrology is aware patient does not make urine will need a hemodialysis tomorrow currently no oxygen requirement  Acute blood loss anemia I suspect some of the blood loss could be due to bedbug infestation Supportive measures for pt Contact  precaution    Other plan as per orders.  DVT prophylaxis:  eliquis      Code Status:    Code Status: Prior FULL CODE  as per patient   I had personally discussed CODE STATUS with patient     Family Communication:   Family not at  Bedside    Disposition Plan:                             Back to current facility when stable                            Following barriers for discharge:                            Electrolytes corrected                               Anemia corrected                                                       Will need consultants to evaluate patient prior to discharge                      Would benefit from PT/OT  eval prior to DC  Ordered                                       Transition of care consulted                   Nutrition    consulted                                   Consults called: nephrology is aware  Admission status:  ED Disposition     ED Disposition  [place]: [facility] [100100]  Level of Care: Telemetry Cardiac [103]  May admit patient to [facility] or [facility] if equivalent level of care is available:: No  Covid Evaluation: Asymptomatic - no recent exposure (last 10 days) testing not required  Diagnosis: Symptomatic anemia [7681157]  Admitting Physician: Toy Baker [[id number]]  Attending Physician: Toy Baker [[id number]]  Certification:: I certify this patient will need inpatient services for at least 2 midnights  Estimated Length of Stay: 2           inpatient     I Expect 2 midnight stay secondary to severity of patient's current illness need for inpatient interventions justified by the following:     Severe lab/radiological/exam abnormalities including:    anemia and extensive comorbidities including:    DM2    CHF   CAD   CKD   Chronic anticoagulation  That are currently affecting medical management.   I expect  patient to be  hospitalized for 2 midnights requiring inpatient medical care.  Patient is at high risk for adverse outcome (such as loss of life or disability) if not treated.  Indication for inpatient stay as follows:    Need for operative/procedural  intervention   Need for  , IV antihypertensives,     Level of care     tele  For  24H         Krislyn Donnan 02/02/2022, 1:09 AM    [facility]     after 2 AM please page floor coverage PA If 7AM-7PM, please contact the day team taking care of the patient using [email or web address]   Patient was evaluated in the context of the global COVID-19 pandemic, which necessitated consideration that the patient might be at risk for infection with the SARS-CoV-2 virus that causes COVID-19. Institutional protocols and algorithms that pertain to the evaluation of patients at risk for COVID-19 are in a state of rapid change based on information released by regulatory bodies including the CDC and federal and state organizations. These policies and algorithms were followed during the patient's care.

## 2022-02-02 ENCOUNTER — Other Ambulatory Visit: Payer: Self-pay

## 2022-02-02 ENCOUNTER — Encounter (HOSPITAL_COMMUNITY): Payer: Self-pay | Admitting: Internal Medicine

## 2022-02-02 ENCOUNTER — Inpatient Hospital Stay (HOSPITAL_COMMUNITY): Payer: 59

## 2022-02-02 DIAGNOSIS — I5031 Acute diastolic (congestive) heart failure: Secondary | ICD-10-CM

## 2022-02-02 DIAGNOSIS — D649 Anemia, unspecified: Secondary | ICD-10-CM | POA: Diagnosis not present

## 2022-02-02 LAB — COMPREHENSIVE METABOLIC PANEL
ALT: 11 U/L (ref 0–44)
AST: 18 U/L (ref 15–41)
Albumin: 3 g/dL — ABNORMAL LOW (ref 3.5–5.0)
Alkaline Phosphatase: 60 U/L (ref 38–126)
Anion gap: 6 (ref 5–15)
BUN: 44 mg/dL — ABNORMAL HIGH (ref 8–23)
CO2: 19 mmol/L — ABNORMAL LOW (ref 22–32)
Calcium: 8.4 mg/dL — ABNORMAL LOW (ref 8.9–10.3)
Chloride: 116 mmol/L — ABNORMAL HIGH (ref 98–111)
Creatinine, Ser: 4.53 mg/dL — ABNORMAL HIGH (ref 0.44–1.00)
GFR, Estimated: 10 mL/min — ABNORMAL LOW (ref >60)
Glucose, Bld: 190 mg/dL — ABNORMAL HIGH (ref 70–99)
Potassium: 4.5 mmol/L (ref 3.5–5.1)
Sodium: 141 mmol/L (ref 135–145)
Total Bilirubin: 0.5 mg/dL (ref 0.3–1.2)
Total Protein: 5.8 g/dL — ABNORMAL LOW (ref 6.5–8.1)

## 2022-02-02 LAB — IRON AND TIBC
Iron: 75 ug/dL (ref 28–170)
Saturation Ratios: 44 % — ABNORMAL HIGH (ref 10.4–31.8)
TIBC: 171 ug/dL — ABNORMAL LOW (ref 250–450)
UIBC: 96 ug/dL

## 2022-02-02 LAB — CBC
HCT: 31.8 % — ABNORMAL LOW (ref 36.0–46.0)
Hemoglobin: 10.5 g/dL — ABNORMAL LOW (ref 12.0–15.0)
MCH: 30.4 pg (ref 26.0–34.0)
MCHC: 33 g/dL (ref 30.0–36.0)
MCV: 92.2 fL (ref 80.0–100.0)
Platelets: 92 10*3/uL — ABNORMAL LOW (ref 150–400)
RBC: 3.45 MIL/uL — ABNORMAL LOW (ref 3.87–5.11)
RDW: 15.7 % — ABNORMAL HIGH (ref 11.5–15.5)
WBC: 5 10*3/uL (ref 4.0–10.5)
nRBC: 0 % (ref 0.0–0.2)

## 2022-02-02 LAB — FOLATE: Folate: 8.5 ng/mL (ref >5.9)

## 2022-02-02 LAB — GLUCOSE, CAPILLARY: Glucose-Capillary: 164 mg/dL — ABNORMAL HIGH (ref 70–99)

## 2022-02-02 LAB — HEPATITIS B SURFACE ANTIBODY,QUALITATIVE: Hep B S Ab: REACTIVE — AB

## 2022-02-02 LAB — RETICULOCYTES
Immature Retic Fract: 8.3 % (ref 2.3–15.9)
RBC.: 3.59 MIL/uL — ABNORMAL LOW (ref 3.87–5.11)
Retic Count, Absolute: 59.2 10*3/uL (ref 19.0–186.0)
Retic Ct Pct: 1.7 % (ref 0.4–3.1)

## 2022-02-02 LAB — CBG MONITORING, ED
Glucose-Capillary: 105 mg/dL — ABNORMAL HIGH (ref 70–99)
Glucose-Capillary: 160 mg/dL — ABNORMAL HIGH (ref 70–99)
Glucose-Capillary: 116 mg/dL — ABNORMAL HIGH (ref 70–99)

## 2022-02-02 LAB — HEPATITIS B SURFACE ANTIGEN: Hepatitis B Surface Ag: NONREACTIVE

## 2022-02-02 LAB — FERRITIN: Ferritin: 785 ng/mL — ABNORMAL HIGH (ref 11–307)

## 2022-02-02 LAB — RESP PANEL BY RT-PCR (FLU A&B, COVID) ARPGX2
Influenza A by PCR: NEGATIVE
Influenza B by PCR: NEGATIVE
SARS Coronavirus 2 by RT PCR: NEGATIVE

## 2022-02-02 LAB — PHOSPHORUS: Phosphorus: 4.1 mg/dL (ref 2.5–4.6)

## 2022-02-02 LAB — MAGNESIUM: Magnesium: 1.7 mg/dL (ref 1.7–2.4)

## 2022-02-02 LAB — HEPATITIS B CORE ANTIBODY, TOTAL: Hep B Core Total Ab: NONREACTIVE

## 2022-02-02 LAB — VITAMIN B12: Vitamin B-12: 200 pg/mL (ref 180–914)

## 2022-02-02 LAB — HEPATITIS C ANTIBODY: HCV Ab: NONREACTIVE

## 2022-02-02 MED ORDER — ACETAMINOPHEN 650 MG RE SUPP: 650.0000 mg | Freq: Four times a day (QID) | RECTAL | Status: DC | PRN

## 2022-02-02 MED ORDER — APIXABAN 5 MG PO TABS
5.0000 mg | ORAL_TABLET | Freq: Two times a day (BID) | ORAL | Status: DC
  Administered 2022-02-02 – 2022-02-18 (×32): 5 mg via ORAL
  Filled 2022-02-02 (×32): qty 1

## 2022-02-02 MED ORDER — ISOSORBIDE MONONITRATE ER 30 MG PO TB24
30.0000 mg | ORAL_TABLET | Freq: Every morning | ORAL | Status: DC
  Administered 2022-02-02 – 2022-02-18 (×17): 30 mg via ORAL
  Filled 2022-02-02 (×17): qty 1

## 2022-02-02 MED ORDER — HYDROCODONE-ACETAMINOPHEN 5-325 MG PO TABS
1.0000 | ORAL_TABLET | ORAL | Status: DC | PRN
  Administered 2022-02-02: 1 via ORAL
  Administered 2022-02-02 – 2022-02-08 (×9): 2 via ORAL
  Administered 2022-02-09 – 2022-02-11 (×4): 1 via ORAL
  Administered 2022-02-12 – 2022-02-14 (×5): 2 via ORAL
  Administered 2022-02-15: 1 via ORAL
  Administered 2022-02-16 – 2022-02-18 (×4): 2 via ORAL
  Filled 2022-02-02 (×11): qty 2
  Filled 2022-02-02: qty 1
  Filled 2022-02-02: qty 2
  Filled 2022-02-02: qty 1
  Filled 2022-02-02 (×2): qty 2
  Filled 2022-02-02: qty 1
  Filled 2022-02-02 (×3): qty 2
  Filled 2022-02-02: qty 1
  Filled 2022-02-02: qty 2
  Filled 2022-02-02: qty 1
  Filled 2022-02-02: qty 2
  Filled 2022-02-02: qty 1

## 2022-02-02 MED ORDER — SODIUM CHLORIDE 0.9 % IV SOLN: 250.0000 mL | INTRAVENOUS | Status: DC | PRN

## 2022-02-02 MED ORDER — ROPINIROLE HCL 0.25 MG PO TABS: 0.2500 mg | ORAL_TABLET | Freq: Every day | ORAL | Status: DC

## 2022-02-02 MED ORDER — INFLUENZA VAC A&B SA ADJ QUAD 0.5 ML IM PRSY
0.5000 mL | PREFILLED_SYRINGE | INTRAMUSCULAR | Status: AC
  Administered 2022-02-04: 0.5 mL via INTRAMUSCULAR
  Filled 2022-02-02: qty 0.5

## 2022-02-02 MED ORDER — DARBEPOETIN ALFA 60 MCG/0.3ML IJ SOSY
60.0000 ug | PREFILLED_SYRINGE | INTRAMUSCULAR | Status: DC
  Administered 2022-02-03: 60 ug via INTRAVENOUS
  Filled 2022-02-02 (×3): qty 0.3

## 2022-02-02 MED ORDER — CHLORHEXIDINE GLUCONATE CLOTH 2 % EX PADS
6.0000 | MEDICATED_PAD | Freq: Every day | CUTANEOUS | Status: DC
  Administered 2022-02-03: 6 via TOPICAL

## 2022-02-02 MED ORDER — ROPINIROLE HCL 0.25 MG PO TABS
0.2500 mg | ORAL_TABLET | Freq: Every day | ORAL | Status: DC
  Administered 2022-02-02 – 2022-02-17 (×16): 0.25 mg via ORAL
  Filled 2022-02-02 (×19): qty 1

## 2022-02-02 MED ORDER — SODIUM CHLORIDE 0.9% FLUSH
3.0000 mL | Freq: Two times a day (BID) | INTRAVENOUS | Status: DC
  Administered 2022-02-02 – 2022-02-18 (×32): 3 mL via INTRAVENOUS

## 2022-02-02 MED ORDER — HYDROCERIN EX CREA
TOPICAL_CREAM | Freq: Two times a day (BID) | CUTANEOUS | Status: DC
  Administered 2022-02-04 – 2022-02-17 (×6): 1 via TOPICAL
  Filled 2022-02-02 (×2): qty 113

## 2022-02-02 MED ORDER — DULOXETINE HCL 60 MG PO CPEP
60.0000 mg | ORAL_CAPSULE | Freq: Every morning | ORAL | Status: DC
  Administered 2022-02-02 – 2022-02-16 (×15): 60 mg via ORAL
  Filled 2022-02-02 (×9): qty 1
  Filled 2022-02-02: qty 2
  Filled 2022-02-02 (×6): qty 1

## 2022-02-02 MED ORDER — METOPROLOL TARTRATE 25 MG PO TABS
25.0000 mg | ORAL_TABLET | Freq: Every day | ORAL | Status: DC
  Administered 2022-02-02 – 2022-02-18 (×17): 25 mg via ORAL
  Filled 2022-02-02 (×17): qty 1

## 2022-02-02 MED ORDER — DOXERCALCIFEROL 4 MCG/2ML IV SOLN
2.0000 ug | INTRAVENOUS | Status: DC
  Administered 2022-02-03 – 2022-02-17 (×3): 2 ug via INTRAVENOUS
  Filled 2022-02-02 (×6): qty 2

## 2022-02-02 MED ORDER — CHLORHEXIDINE GLUCONATE CLOTH 2 % EX PADS
6.0000 | MEDICATED_PAD | Freq: Every day | CUTANEOUS | Status: DC
  Administered 2022-02-03 – 2022-02-04 (×2): 6 via TOPICAL

## 2022-02-02 MED ORDER — PANTOPRAZOLE SODIUM 20 MG PO TBEC
20.0000 mg | DELAYED_RELEASE_TABLET | Freq: Every morning | ORAL | Status: DC
  Administered 2022-02-02 – 2022-02-18 (×17): 20 mg via ORAL
  Filled 2022-02-02 (×17): qty 1

## 2022-02-02 MED ORDER — HYDRALAZINE HCL 50 MG PO TABS
50.0000 mg | ORAL_TABLET | Freq: Three times a day (TID) | ORAL | Status: DC
  Administered 2022-02-02 – 2022-02-18 (×44): 50 mg via ORAL
  Filled 2022-02-02 (×10): qty 1
  Filled 2022-02-02: qty 2
  Filled 2022-02-02 (×31): qty 1
  Filled 2022-02-02: qty 2
  Filled 2022-02-02: qty 1

## 2022-02-02 MED ORDER — LIDOCAINE 5 % EX PTCH
1.0000 | MEDICATED_PATCH | CUTANEOUS | Status: DC
  Administered 2022-02-03 – 2022-02-17 (×5): 1 via TRANSDERMAL
  Filled 2022-02-02 (×7): qty 1

## 2022-02-02 MED ORDER — ACETAMINOPHEN 325 MG PO TABS
650.0000 mg | ORAL_TABLET | Freq: Four times a day (QID) | ORAL | Status: DC | PRN
  Administered 2022-02-07 – 2022-02-16 (×4): 650 mg via ORAL
  Filled 2022-02-02 (×5): qty 2

## 2022-02-02 MED ORDER — PRAVASTATIN SODIUM 40 MG PO TABS
40.0000 mg | ORAL_TABLET | Freq: Every morning | ORAL | Status: DC
  Administered 2022-02-02 – 2022-02-18 (×17): 40 mg via ORAL
  Filled 2022-02-02 (×17): qty 1

## 2022-02-02 MED ORDER — ALBUTEROL SULFATE HFA 108 (90 BASE) MCG/ACT IN AERS: 2.0000 | INHALATION_SPRAY | RESPIRATORY_TRACT | Status: DC | PRN

## 2022-02-02 MED ORDER — SODIUM CHLORIDE 0.9% FLUSH: 3.0000 mL | INTRAVENOUS | Status: DC | PRN

## 2022-02-02 MED ORDER — HYDROXYZINE HCL 10 MG PO TABS
10.0000 mg | ORAL_TABLET | Freq: Three times a day (TID) | ORAL | Status: DC | PRN
  Administered 2022-02-02 – 2022-02-10 (×5): 10 mg via ORAL
  Filled 2022-02-02 (×6): qty 1

## 2022-02-02 MED ORDER — AMLODIPINE BESYLATE 10 MG PO TABS
10.0000 mg | ORAL_TABLET | Freq: Every morning | ORAL | Status: DC
  Administered 2022-02-02 – 2022-02-10 (×9): 10 mg via ORAL
  Filled 2022-02-02: qty 1
  Filled 2022-02-02: qty 2
  Filled 2022-02-02 (×7): qty 1

## 2022-02-02 NOTE — Consult Note (Addendum)
Renal Service Consult Note [facility]  Ann [name] 02/02/2022 [name], MD Requesting Physician: Dr. Eliseo Squires  Reason for Consult: ESRD pt w/ symptomatic anemia HPI: The patient is a 71 y.o. year-old w/ hx of ESRD, CAD, HTN, DM2 who presented to ED w/ c/o fatigue and malaise for some time. She has bedbugs and her public transport to HD won't take her to dialysis so she has missed about 2 wks of dialysis. Hb in ED 7.0, GI blood rule out. Pt admitted. Asked to see for dialysis.   Pt states she urinates a lot. Frequently in large amounts. Denies any n/v or confusion or jerking.   ROS - denies CP, no joint pain, no HA, no blurry vision, no rash, no diarrhea, no nausea/ vomiting, no dysuria, no difficulty voiding   Past Medical History  Past Medical History:  Diagnosis Date   Anemia 09/29/2008   anemia of chronic disease   Anginal pain (HCC)    no recent chest pain   Arthritis    Asthma 09/29/2008   CAD (coronary artery disease) 09/29/2008   hx  PCI - [facility]. Stress test 10/2011 nl with EF 58%. [facility] card., last cath 2010   Chest pain 02/16/2009   ESRD on hemodialysis [facility])    MWF started HD in early 2022   GERD (gastroesophageal reflux disease)    Hyperlipidemia 10/21/2009   Hypertension 09/29/2008   Insulin dependent diabetes mellitus type IA ([place]) 09/29/2008   Lumbar back pain 02/16/2009   states pain goes down right leg   Myalgia 02/16/2009   Shortness of breath    with exerction   Shoulder pain, left 03/16/2009   occurs at night   Sleep apnea    no cpap   Past Surgical History  Past Surgical History:  Procedure Laterality Date   ABDOMINAL HYSTERECTOMY     APPENDECTOMY     AV FISTULA PLACEMENT Right 08/12/2020   Procedure: BRACHIOCEPHALIC ARTERIOVENOUS (AV) FISTULA CREATION RIGHT;  Surgeon: Serafina Mitchell, MD;  Location: [place];  Service: Vascular;  Laterality: Right;   [place] Right 10/14/2020   Procedure: RIGHT  ARM SECOND STAGE BRACHIOCEPHALIC FISTULA TRANSPOSITION;  Surgeon: Serafina Mitchell, MD;  Location: [place];  Service: Vascular;  Laterality: Right;   CARDIAC CATHETERIZATION  2008   in [place], [place] stable   CARDIAC CATHETERIZATION  2010   patent stents in mid and distal RCA; high grade disease in prox & mid AV groove LCX& OM; mod disease in prox & mid LAD normal LV FUNCTION--TREATED MEDICALLY     CATARACT EXTRACTION, BILATERAL     CHOLECYSTECTOMY     CORONARY ANGIOPLASTY WITH STENT PLACEMENT  2005   2 vessel PCI at [place].   DOPPLER ECHOCARDIOGRAPHY  1998   EF 60%   IR FLUORO GUIDE CV LINE LEFT  04/21/2020   IR US GUIDE VASC ACCESS LEFT  04/21/2020   TOTAL KNEE ARTHROPLASTY  12/2011   Left   TOTAL KNEE ARTHROPLASTY  11/28/2011   Procedure: TOTAL KNEE ARTHROPLASTY;  Surgeon: Hessie Dibble, MD;  Location: [place];  Service: Orthopedics;  Laterality: Right;   Family History  Family History  Problem Relation Age of Onset   Heart attack Father    Heart disease Sister    Heart disease Brother    Diabetes Mother    Hypertension Mother    Heart disease Mother    Heart attack Mother    Stroke Mother  Colon cancer Neg Hx    Social History  reports that she has never smoked. Her smokeless tobacco use includes snuff. She reports that she does not drink alcohol and does not use drugs. Allergies  Allergies  Allergen Reactions   Penicillins Anaphylaxis   Codeine Nausea And Vomiting   Metronidazole Itching   Latex Itching    Believes it was powder   Home medications Prior to Admission medications   Medication Sig Start Date End Date Taking? Authorizing Provider  acetaminophen (TYLENOL) 325 MG tablet Take 650 mg by mouth 2 (two) times daily as needed (for back pain).   Yes [provider]  albuterol (VENTOLIN HFA) 108 (90 Base) MCG/ACT inhaler Inhale 2 puffs into the lungs every 4 (four) hours as needed for wheezing or shortness of breath. 05/04/20  Yes Medina-Vargas, Monina C,  NP  amLODipine (NORVASC) 10 MG tablet Take 10 mg by mouth in the morning.   Yes [provider]  apixaban (ELIQUIS) 5 MG TABS tablet Take 1 tablet (5 mg total) by mouth 2 (two) times daily. 05/04/20  Yes Medina-Vargas, Monina C, NP  DULoxetine (CYMBALTA) 60 MG capsule Take 1 capsule (60 mg total) by mouth in the morning. 11/19/20  Yes Rehman, Areeg N, DO  hydrALAZINE (APRESOLINE) 50 MG tablet Take 1 tablet (50 mg total) by mouth 3 (three) times daily. 05/04/20  Yes Medina-Vargas, Monina C, NP  HYDROcodone-acetaminophen (NORCO/VICODIN) 5-325 MG tablet Take 1 tablet by mouth every 4 (four) hours as needed for moderate pain. Patient taking differently: Take 1 tablet by mouth every 4 (four) hours as needed for severe pain. 08/31/21 08/31/22 Yes Pahwani, Einar Grad, MD  isosorbide mononitrate (IMDUR) 30 MG 24 hr tablet Take 30 mg by mouth in the morning.   Yes [provider]  lidocaine-prilocaine (EMLA) cream 1 application daily as needed (port access). 04/08/21  Yes [provider]  linagliptin (TRADJENTA) 5 MG TABS tablet Take 5 mg by mouth in the morning.   Yes [provider]  loratadine (CLARITIN) 10 MG tablet Take 10 mg by mouth daily as needed for allergies. 08/23/21  Yes [provider]  metoprolol tartrate (LOPRESSOR) 25 MG tablet Take 0.5 tablets (12.5 mg total) by mouth in the morning and at bedtime. Patient taking differently: Take 25 mg by mouth daily. 11/19/20  Yes Rehman, Areeg N, DO  pantoprazole (PROTONIX) 20 MG tablet Take 1 tablet (20 mg total) by mouth in the morning. 11/19/20  Yes Rehman, Areeg N, DO  pravastatin (PRAVACHOL) 40 MG tablet Take 1 tablet (40 mg total) by mouth in the morning. 11/20/20  Yes Orvis Brill, MD  rOPINIRole (REQUIP) 0.25 MG tablet Take 0.25 mg by mouth daily. 10/21/20  Yes [provider]  ACCU-CHEK FASTCLIX LANCETS MISC USE AS DIRECTED TO CHECK BLOOD SUGAR FOUR TIMES A DAY(BEFORE MEALS AND AT BEDTIME). 10/14/12    Sid Falcon, MD  ACCU-CHEK SMARTVIEW test strip CHECK BLOOD SUGAR BEFORE MEALS AND AT BEDTIME 10/14/12   Sid Falcon, MD  Insulin Pen Needle (EASY TOUCH PEN NEEDLES) 31G X 8 MM MISC Use to inject insulin as instructed. 02/02/12   Bartholomew Crews, MD  TOUJEO SOLOSTAR 300 UNIT/ML Solostar Pen Inject 4 Units into the skin at bedtime. Patient not taking: Reported on 05/25/2021 11/19/20   Rehman, Areeg N, DO  atorvastatin (LIPITOR) 40 MG tablet Take 1 tablet (40 mg total) by mouth daily. 11/19/20 11/19/20  Orvis Brill, MD     Vitals:  02/02/22 0800 02/02/22 0830 02/02/22 0900 02/02/22 1000  BP:  (!) 190/72 (!) 178/90 (!) 180/71  Pulse: 86 (!) 104 (!) 110 93  Resp: 15 (!) 25 (!) 22 17  Temp:  98.6 F (37 C)    TempSrc:  Oral    SpO2: 100% 100% 100% 100%   Exam Gen alert, no distress No rash, cyanosis or gangrene Sclera anicteric, throat clear  No jvd or bruits Chest clear bilat to bases, no rales/ wheezing RRR no MRG Abd soft ntnd no mass or ascites +bs GU defer MS no joint effusions or deformity Ext no LE or UE edema, no wounds or ulcers Neuro is alert, Ox 3 , nf    RUA AVF+bruit   Home meds include - albuterol, amlodipine 10, apixaban, duloxetine, hydralazine 50 tid, norco prn, isosorbide mononitrate 30, linagliptin, metoprolol 25 qd, pantoprazole, pravastatin, ropinirole, prns/ supps/ vits   OP HD: [facility] [place] [facility]  400/ 500  78.6kg  3K/2.5 bath  Hep 2000  RUA AVF - last HD 9/27, post wt 79kg - last Hb 9.7 on 9/27 - mircera 30 q2, last 9/27 - venofer '100mg'$  weekly, last 9/27 - doxercalciferol 2 ug IV once per wk, last 9/27  Assessment/ Plan: Anemia symptomatic - Hb 7's, per pmd ESRD - missed about 2 wks of HD (see below), seen here for malaise, gen'd weakness. Azotemia not severe but suspect uremia is likely given she missed 2 wks dialysis. Plan serial HD today and tomorrow (if staffing allows).  HD transportation - will ask SW/ CM to look into her apt situation  (reportedly has bedbugs).  HTN - cont meds, no gross vol overload on exam H/o DVT - takes eliquis Anemia esrd - last esa was on 9/27, will start darbe here at higher dose 60 ug weekly and get fe / tibc.  MBD ckd - Ca in range, get alb/ phos. Question binder?. Cont IV vdra weekly.       Kelly Splinter  MD 02/02/2022, 10:38 AM Recent Labs  Lab 02/01/22 2120  HGB 7.6*  CALCIUM 8.8*  CREATININE 4.67*  K 4.6   Inpatient medications:  Chlorhexidine Gluconate Cloth  6 each Topical Q0600   hydrocerin   Topical BID   insulin aspart  0-5 Units Subcutaneous QHS   insulin aspart  0-6 Units Subcutaneous TID WC    hydrOXYzine

## 2022-02-02 NOTE — Progress Notes (Signed)
Pt receives out-pt HD at [facility] on MWF. Received an e-mail from clinic social worker to say that pt has missed abut 2 weeks of dialysis. Pt has bed begs at her home and transportation [employer]) will not transport pt until home has been treated and proof of no bed bug activity is brought into the clinic. Per clinic social worker, [place] authority/[employer] has been out to [employer] home for the request of extermination, but clinic staff are unsure if home has been treated. Clinic has been unable to reach pt to confirm if any treatments have been completed. Pt has a daughter and niece but neither one of them have a private vehicle to bring pt to clinic for treatments. Per clinic social worker, pt will need to find a way to HD treatments until the bed beds have been exterminated and their is proof of no activity brought to clinic.   Melven Sartorius Renal Navigator [phone]

## 2022-02-02 NOTE — Assessment & Plan Note (Signed)
I suspect some of the blood loss could be due to bedbug infestation Supportive measures for pt Contact precaution

## 2022-02-02 NOTE — ED Notes (Signed)
ED TO INPATIENT HANDOFF REPORT  ED Nurse Name and Phone #: [phone]  S Name/Age/Gender Ann [name] 71 y.o. female Room/Bed: 022C/022C  Code Status   Code Status: Full Code  Home/SNF/Other Nursing Home Patient oriented to: self, place, time, and situation Is this baseline? Yes   Triage Complete: Triage complete  Chief Complaint Symptomatic anemia [D64.9]  Triage Note Pt arrives via [facility] from home for malaise. Pt had no specific complaints with EMS, just fatigue and malaise for quite some time. Pt has missed 6 dialysis treatments due to pt having bed bugs in her home and transport will not take her.   VSS with EMS.    Allergies Allergies  Allergen Reactions   Penicillins Anaphylaxis   Codeine Nausea And Vomiting   Metronidazole Itching   Latex Itching    Believes it was powder    Level of Care/Admitting Diagnosis ED Disposition     ED Disposition  Admit   Condition  --   Comment  Hospital Area: [place] [[id number]]  Level of Care: Telemetry Cardiac [[id number]]  May admit patient to [facility] or [facility] if equivalent level of care is available:: No  Covid Evaluation: Asymptomatic - no recent exposure (last 10 days) testing not required  Diagnosis: Symptomatic anemia [1610960]  Admitting Physician: Toy Baker [[id number]]  Attending Physician: Toy Baker [[id number]]  Certification:: I certify this patient will need inpatient services for at least 2 midnights  Estimated Length of Stay: 2          B Medical/Surgery History Past Medical History:  Diagnosis Date   Anemia 09/29/2008   anemia of chronic disease   Anginal pain ([place])    no recent chest pain   Arthritis    Asthma 09/29/2008   CAD (coronary artery disease) 09/29/2008   hx  PCI - [facility]. Stress test 10/2011 nl with EF 58%. [place] card., last cath 2010   Chest pain 02/16/2009   ESRD on hemodialysis [facility])    MWF started HD in early 2022   GERD  (gastroesophageal reflux disease)    Hyperlipidemia 10/21/2009   Hypertension 09/29/2008   Insulin dependent diabetes mellitus type IA ([place]) 09/29/2008   Lumbar back pain 02/16/2009   states pain goes down right leg   Myalgia 02/16/2009   Shortness of breath    with exerction   Shoulder pain, left 03/16/2009   occurs at night   Sleep apnea    no cpap   Past Surgical History:  Procedure Laterality Date   ABDOMINAL HYSTERECTOMY     APPENDECTOMY     AV FISTULA PLACEMENT Right 08/12/2020   Procedure: BRACHIOCEPHALIC ARTERIOVENOUS (AV) FISTULA CREATION RIGHT;  Surgeon: Serafina Mitchell, MD;  Location: [facility];  Service: Vascular;  Laterality: Right;   [place] Right 10/14/2020   Procedure: RIGHT ARM SECOND STAGE BRACHIOCEPHALIC FISTULA TRANSPOSITION;  Surgeon: Serafina Mitchell, MD;  Location: [facility];  Service: Vascular;  Laterality: Right;   CARDIAC CATHETERIZATION  2008   in [place], [place] stable   CARDIAC CATHETERIZATION  2010   patent stents in mid and distal RCA; high grade disease in prox & mid AV groove LCX& OM; mod disease in prox & mid LAD normal LV FUNCTION--TREATED MEDICALLY     CATARACT EXTRACTION, BILATERAL     CHOLECYSTECTOMY     CORONARY ANGIOPLASTY WITH STENT PLACEMENT  2005   2 vessel PCI at [place].   [place]  60%   IR FLUORO GUIDE CV LINE LEFT  04/21/2020   IR US GUIDE VASC ACCESS LEFT  04/21/2020   TOTAL KNEE ARTHROPLASTY  12/2011   Left   TOTAL KNEE ARTHROPLASTY  11/28/2011   Procedure: TOTAL KNEE ARTHROPLASTY;  Surgeon: Hessie Dibble, MD;  Location: [facility];  Service: Orthopedics;  Laterality: Right;     A IV Location/Drains/Wounds Patient Lines/Drains/Airways Status     Active Line/Drains/Airways     Name Placement date Placement time Site Days   Peripheral IV 11/17/20 22 G 1.75" Left;Lateral Forearm 11/17/20  0654  Forearm  442   Peripheral IV 08/29/21 20 G Left Antecubital 08/29/21  0200  Antecubital  157    Fistula / Graft Right Upper arm Arteriovenous fistula 08/12/20  1120  Upper arm  539   Fistula / Graft Right Upper arm --  --  Upper arm  --   Hemodialysis Catheter Left Internal jugular Double lumen Permanent (Tunneled) 04/21/20  1811  Internal jugular  652   Incision (Closed) 08/12/20 Arm Right 08/12/20  1134  -- 539   Incision (Closed) 10/14/20 Arm Right 10/14/20  0854  -- 476            Intake/Output Last 24 hours No intake or output data in the 24 hours ending 02/02/22 1659  Labs/Imaging Results for orders placed or performed during the hospital encounter of 02/01/22 (from the past 48 hour(s))  CBC with Differential     Status: Abnormal   Collection Time: 02/01/22  9:20 PM  Result Value Ref Range   WBC 4.6 4.0 - 10.5 K/uL   RBC 2.45 (L) 3.87 - 5.11 MIL/uL   Hemoglobin 7.6 (L) 12.0 - 15.0 g/dL   HCT 23.3 (L) 36.0 - 46.0 %   MCV 95.1 80.0 - 100.0 fL   MCH 31.0 26.0 - 34.0 pg   MCHC 32.6 30.0 - 36.0 g/dL   RDW 14.6 11.5 - 15.5 %   Platelets 169 150 - 400 K/uL   nRBC 0.0 0.0 - 0.2 %   Neutrophils Relative % 59 %   Neutro Abs 2.7 1.7 - 7.7 K/uL   Lymphocytes Relative 26 %   Lymphs Abs 1.2 0.7 - 4.0 K/uL   Monocytes Relative 9 %   Monocytes Absolute 0.4 0.1 - 1.0 K/uL   Eosinophils Relative 6 %   Eosinophils Absolute 0.3 0.0 - 0.5 K/uL   Basophils Relative 0 %   Basophils Absolute 0.0 0.0 - 0.1 K/uL   Immature Granulocytes 0 %   Abs Immature Granulocytes 0.02 0.00 - 0.07 K/uL    Comment: Performed at [facility], [address] [place], [place] [address]  Basic metabolic panel     Status: Abnormal   Collection Time: 02/01/22  9:20 PM  Result Value Ref Range   Sodium 143 135 - 145 mmol/L   Potassium 4.6 3.5 - 5.1 mmol/L   Chloride 114 (H) 98 - 111 mmol/L   CO2 22 22 - 32 mmol/L   Glucose, Bld 124 (H) 70 - 99 mg/dL    Comment: Glucose reference range applies only to samples taken after fasting for at least 8 hours.   BUN 45 (H) 8 - 23 mg/dL   Creatinine, Ser  4.67 (H) 0.44 - 1.00 mg/dL   Calcium 8.8 (L) 8.9 - 10.3 mg/dL   GFR, Estimated 9 (L) >60 mL/min    Comment: (NOTE) Calculated using the CKD-EPI Creatinine Equation (2021)    Anion gap  7 5 - 15    Comment: Performed at [facility], [place] [address] [place], [place] [address]  POC CBG, ED     Status: Abnormal   Collection Time: 02/01/22 10:56 PM  Result Value Ref Range   Glucose-Capillary 107 (H) 70 - 99 mg/dL    Comment: Glucose reference range applies only to samples taken after fasting for at least 8 hours.  POC occult blood, ED     Status: None   Collection Time: 02/01/22 11:06 PM  Result Value Ref Range   Fecal Occult Bld NEGATIVE NEGATIVE  Type and screen [place]     Status: None (Preliminary result)   Collection Time: 02/01/22 11:20 PM  Result Value Ref Range   ABO/RH(D) O POS    Antibody Screen NEG    Sample Expiration 02/04/2022,2359    Unit Number [id number]    Blood Component Type RED CELLS,LR    Unit division 00    Status of Unit ISSUED    Transfusion Status OK TO TRANSFUSE    Crossmatch Result Compatible    Unit Number [id number]    Blood Component Type RED CELLS,LR    Unit division 00    Status of Unit ISSUED    Transfusion Status OK TO TRANSFUSE    Crossmatch Result      Compatible Performed at [facility], [place] [address], [place], [place] [address]   Prepare RBC (crossmatch)     Status: None   Collection Time: 02/01/22 11:20 PM  Result Value Ref Range   Order Confirmation      ORDER PROCESSED BY BLOOD BANK Performed at [facility], [place] [address], [place], [place] [address]   Resp Panel by RT-PCR (Flu A&B, Covid) Anterior Nasal Swab     Status: None   Collection Time: 02/02/22  6:30 AM   Specimen: Anterior Nasal Swab  Result Value Ref Range   SARS Coronavirus 2 by RT PCR NEGATIVE NEGATIVE    Comment: (NOTE) SARS-CoV-2 target nucleic acids are NOT DETECTED.  The SARS-CoV-2 RNA is generally detectable in  upper respiratory specimens during the acute phase of infection. The lowest concentration of SARS-CoV-2 viral copies this assay can detect is 138 copies/mL. A negative result does not preclude SARS-Cov-2 infection and should not be used as the sole basis for treatment or other patient management decisions. A negative result may occur with  improper specimen collection/handling, submission of specimen other than nasopharyngeal swab, presence of viral mutation(s) within the areas targeted by this assay, and inadequate number of viral copies(<138 copies/mL). A negative result must be combined with clinical observations, patient history, and epidemiological information. The expected result is Negative.  Fact Sheet for Patients:  EntrepreneurPulse.com.au  Fact Sheet for Healthcare Providers:  IncredibleEmployment.be  This test is no t yet approved or cleared by the [place] FDA and  has been authorized for detection and/or diagnosis of SARS-CoV-2 by FDA under an Emergency Use Authorization (EUA). This EUA will remain  in effect (meaning this test can be used) for the duration of the COVID-19 declaration under Section 564(b)(1) of the Act, 21 U.S.C.section 360bbb-3(b)(1), unless the authorization is terminated  or revoked sooner.       Influenza A by PCR NEGATIVE NEGATIVE   Influenza B by PCR NEGATIVE NEGATIVE    Comment: (NOTE) The Xpert Xpress SARS-CoV-2/FLU/RSV plus assay is intended as an aid in the diagnosis of influenza from Nasopharyngeal swab specimens and should not  be used as a sole basis for treatment. Nasal washings and aspirates are unacceptable for Xpert Xpress SARS-CoV-2/FLU/RSV testing.  Fact Sheet for Patients: EntrepreneurPulse.com.au  Fact Sheet for Healthcare Providers: IncredibleEmployment.be  This test is not yet approved or cleared by the [place] FDA and has been authorized  for detection and/or diagnosis of SARS-CoV-2 by FDA under an Emergency Use Authorization (EUA). This EUA will remain in effect (meaning this test can be used) for the duration of the COVID-19 declaration under Section 564(b)(1) of the Act, 21 U.S.C. section 360bbb-3(b)(1), unless the authorization is terminated or revoked.  Performed at [facility], [place] [address] [place], [place] [address]   CBG monitoring, ED     Status: Abnormal   Collection Time: 02/02/22  7:31 AM  Result Value Ref Range   Glucose-Capillary 105 (H) 70 - 99 mg/dL    Comment: Glucose reference range applies only to samples taken after fasting for at least 8 hours.  Vitamin B12     Status: None   Collection Time: 02/02/22 10:56 AM  Result Value Ref Range   Vitamin B-12 200 180 - 914 pg/mL    Comment: (NOTE) This assay is not validated for testing neonatal or myeloproliferative syndrome specimens for Vitamin B12 levels. Performed at [facility], [place] [address], [place], [place] [address]   Folate     Status: None   Collection Time: 02/02/22 10:56 AM  Result Value Ref Range   Folate 8.5 >5.9 ng/mL    Comment: Performed at [facility] [address] [place], [place] [address]  Iron and TIBC     Status: Abnormal   Collection Time: 02/02/22 10:56 AM  Result Value Ref Range   Iron 75 28 - 170 ug/dL   TIBC 171 (L) 250 - 450 ug/dL   Saturation Ratios 44 (H) 10.4 - 31.8 %   UIBC 96 ug/dL    Comment: Performed at [facility], [place] [address], [place], [place] [address]  Ferritin     Status: Abnormal   Collection Time: 02/02/22 10:56 AM  Result Value Ref Range   Ferritin 785 (H) 11 - 307 ng/mL    Comment: Performed at [facility], [place] [address], [place], [place] [address]  Reticulocytes     Status: Abnormal   Collection Time: 02/02/22 10:56 AM  Result Value Ref Range   Retic Ct Pct 1.7 0.4 - 3.1 %   RBC. 3.59 (L) 3.87 - 5.11 MIL/uL   Retic Count, Absolute 59.2 19.0 - 186.0  K/uL   Immature Retic Fract 8.3 2.3 - 15.9 %    Comment: Performed at [facility] [place] [address], [place], [place] [address]  CBC     Status: Abnormal   Collection Time: 02/02/22 10:56 AM  Result Value Ref Range   WBC 5.0 4.0 - 10.5 K/uL   RBC 3.45 (L) 3.87 - 5.11 MIL/uL   Hemoglobin 10.5 (L) 12.0 - 15.0 g/dL    Comment: REPEATED TO VERIFY POST TRANSFUSION SPECIMEN    HCT 31.8 (L) 36.0 - 46.0 %   MCV 92.2 80.0 - 100.0 fL   MCH 30.4 26.0 - 34.0 pg   MCHC 33.0 30.0 - 36.0 g/dL   RDW 15.7 (H) 11.5 - 15.5 %   Platelets 92 (L) 150 - 400 K/uL    Comment: Immature Platelet Fraction may be clinically indicated, consider ordering this additional test [id number] REPEATED TO VERIFY PLATELET COUNT CONFIRMED  BY SMEAR    nRBC 0.0 0.0 - 0.2 %    Comment: Performed at [facility], [place] [address], [place], [place] [address]  Hepatitis B surface antigen     Status: None   Collection Time: 02/02/22 10:56 AM  Result Value Ref Range   Hepatitis B Surface Ag NON REACTIVE NON REACTIVE    Comment: Performed at [facility] [address] [place], [place] [address]  Hepatitis B surface antibody     Status: Abnormal   Collection Time: 02/02/22 10:56 AM  Result Value Ref Range   Hep B S Ab Reactive (A) NON REACTIVE    Comment: (NOTE) Consistent with immunity, greater than 9.9 mIU/mL.  Performed at [facility], [place] [address] [place], [place] [address]   Hepatitis B core antibody, total     Status: None   Collection Time: 02/02/22 10:56 AM  Result Value Ref Range   Hep B Core Total Ab NON REACTIVE NON REACTIVE    Comment: Performed at [facility] [address] [place], [place] [address]  Hepatitis C antibody     Status: None   Collection Time: 02/02/22 10:56 AM  Result Value Ref Range   HCV Ab NON REACTIVE NON REACTIVE    Comment: (NOTE) Nonreactive HCV antibody screen is consistent with no HCV infections,  unless recent infection is suspected or other  evidence exists to indicate HCV infection.  Performed at [facility], [place] [address] [place], [place] [address]   Comprehensive metabolic panel     Status: Abnormal   Collection Time: 02/02/22 10:56 AM  Result Value Ref Range   Sodium 141 135 - 145 mmol/L   Potassium 4.5 3.5 - 5.1 mmol/L   Chloride 116 (H) 98 - 111 mmol/L   CO2 19 (L) 22 - 32 mmol/L   Glucose, Bld 190 (H) 70 - 99 mg/dL    Comment: Glucose reference range applies only to samples taken after fasting for at least 8 hours.   BUN 44 (H) 8 - 23 mg/dL   Creatinine, Ser 4.53 (H) 0.44 - 1.00 mg/dL   Calcium 8.4 (L) 8.9 - 10.3 mg/dL   Total Protein 5.8 (L) 6.5 - 8.1 g/dL   Albumin 3.0 (L) 3.5 - 5.0 g/dL   AST 18 15 - 41 U/L   ALT 11 0 - 44 U/L   Alkaline Phosphatase 60 38 - 126 U/L   Total Bilirubin 0.5 0.3 - 1.2 mg/dL   GFR, Estimated 10 (L) >60 mL/min    Comment: (NOTE) Calculated using the CKD-EPI Creatinine Equation (2021)    Anion gap 6 5 - 15    Comment: Performed at [facility], [place] [address] [place], [place] [address]  Magnesium     Status: None   Collection Time: 02/02/22 10:56 AM  Result Value Ref Range   Magnesium 1.7 1.7 - 2.4 mg/dL    Comment: Performed at [facility] [address] [place], [place] [address]  Phosphorus     Status: None   Collection Time: 02/02/22 10:56 AM  Result Value Ref Range   Phosphorus 4.1 2.5 - 4.6 mg/dL    Comment: Performed at [facility] [address] [place],  [address]  CBG monitoring, ED     Status: Abnormal   Collection Time: 02/02/22 12:40 PM  Result Value Ref Range   Glucose-Capillary 160 (H) 70 - 99 mg/dL  Comment: Glucose reference range applies only to samples taken after fasting for at least 8 hours.  CBG monitoring, ED     Status: Abnormal   Collection Time: 02/02/22  4:31 PM  Result Value Ref Range   Glucose-Capillary 116 (H) 70 - 99 mg/dL    Comment: Glucose reference range applies only to samples taken after  fasting for at least 8 hours.   DG Chest Portable 1 View  Result Date: 02/01/2022 CLINICAL DATA:  Fatigue EXAM: PORTABLE CHEST 1 VIEW COMPARISON:  Previous studies including the examination of 05/25/2021 FINDINGS: Transverse diameter of heart is slightly increased. Thoracic aorta is tortuous and ectatic. Lung fields are clear of any infiltrates or pulmonary edema. There is no pleural effusion or pneumothorax. IMPRESSION: No active disease. Electronically Signed   By: Elmer Picker M.D.   On: 02/01/2022 18:52    Pending Labs Unresulted Labs (From admission, onward)     Start     Ordered   02/02/22 1022  Hepatitis B surface antibody,quantitative  (New Admission Hemo Labs (Hepatitis B))  Once,   R        02/02/22 1022   02/01/22 2218  Occult blood card to lab, stool  Once,   URGENT        02/01/22 2217            Vitals/Pain Today's Vitals   02/02/22 1250 02/02/22 1551 02/02/22 1638 02/02/22 1657  BP: (!) 173/67 (!) 171/71  (!) 171/67  Pulse: 88 71  68  Resp: '12 16  17  '$ Temp: 98.4 F (36.9 C)   97.7 F (36.5 C)  TempSrc:    Oral  SpO2: 100% 100%  100%  PainSc:   9      Isolation Precautions Contact precautions  Medications Medications  insulin aspart (novoLOG) injection 0-5 Units ( Subcutaneous Not Given 02/01/22 2330)  insulin aspart (novoLOG) injection 0-6 Units ( Subcutaneous Not Given 02/02/22 1633)  amLODipine (NORVASC) tablet 10 mg (10 mg Oral Given 02/02/22 1245)  apixaban (ELIQUIS) tablet 5 mg (5 mg Oral Given 02/02/22 1246)  DULoxetine (CYMBALTA) DR capsule 60 mg (60 mg Oral Given 02/02/22 1245)  hydrALAZINE (APRESOLINE) tablet 50 mg (50 mg Oral Given 02/02/22 1632)  isosorbide mononitrate (IMDUR) 24 hr tablet 30 mg (30 mg Oral Given 02/02/22 1247)  metoprolol tartrate (LOPRESSOR) tablet 25 mg (25 mg Oral Given 02/02/22 1246)  pantoprazole (PROTONIX) EC tablet 20 mg (20 mg Oral Given 02/02/22 1247)  pravastatin (PRAVACHOL) tablet 40 mg (40 mg Oral Given  02/02/22 1247)  rOPINIRole (REQUIP) tablet 0.25 mg (0.25 mg Oral Not Given 02/02/22 1337)  acetaminophen (TYLENOL) tablet 650 mg (has no administration in time range)    Or  acetaminophen (TYLENOL) suppository 650 mg (has no administration in time range)  HYDROcodone-acetaminophen (NORCO/VICODIN) 5-325 MG per tablet 1-2 tablet (has no administration in time range)  sodium chloride flush (NS) 0.9 % injection 3 mL (3 mLs Intravenous Given 02/02/22 1249)  sodium chloride flush (NS) 0.9 % injection 3 mL (has no administration in time range)  0.9 %  sodium chloride infusion (has no administration in time range)  hydrocerin (EUCERIN) cream ( Topical Not Given 02/02/22 0959)  hydrOXYzine (ATARAX) tablet 10 mg (has no administration in time range)  Chlorhexidine Gluconate Cloth 2 % PADS 6 each (6 each Topical Not Given 02/02/22 1040)  Darbepoetin Alfa (ARANESP) injection 60 mcg (has no administration in time range)  doxercalciferol (HECTOROL) injection 2 mcg (has no administration in time range)  Chlorhexidine Gluconate Cloth 2 % PADS 6 each (6 each Topical Not Given 02/02/22 1326)  lidocaine (LIDODERM) 5 % 1 patch (has no administration in time range)  0.9 %  sodium chloride infusion (0 mL/hr Intravenous Stopped 02/02/22 0820)  hydrALAZINE (APRESOLINE) injection 10 mg (10 mg Intravenous Given 02/02/22 0500)    Mobility   Low fall risk   Focused Assessments Renal Assessment Handoff:  Hemodialysis Schedule: Hemodialysis Schedule: Monday/Wednesday/Friday Last Hemodialysis date and time: she has missed two weeks of dialysis    Restricted appendage: right arm   R Recommendations: See Admitting Provider Note  Report given to:   Additional Notes:

## 2022-02-02 NOTE — Progress Notes (Addendum)
PROGRESS NOTE    Ann [name]  JEH:[id number] DOB: 04-Jul-1950 DOA: 02/01/2022 PCP: Antony Blackbird, MD    Brief Narrative:  Ann [name] is a 71 y.o. 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, right VCD or RVH  Comes back from home for malaise fatigue for quite some time Patient missed dialysis treatments because she has bedbugs and transport would not take her    Assessment and Plan:    Symptomatic anemia due to ABLA Progressive could be related to anemia of chronic disease Hemoccult negative no complaints of bright red blood per rectum or melena.  Transfuse as needed for hemoglobin below 7 Check anemia panel- does not seem to be done prior to transfusion ? From bed bugs   Diabetes mellitus with ESRD (end-stage renal disease) (HCC)  - Order Sensitive SSI    Hyperlipidemia Continue Pravachol 40 mg p.o. daily   HYPERTENSION Restart Norvasc 10 mg p.o. daily hydralazine 50 mg p.o. 3 times daily and Imdur 30 mg daily as well as Lopressor 25 mg daily   CAD (coronary artery disease) Chronic stable resume metoprolol Lopressor 25 mg daily and Pravachol 40 mg daily   End stage renal disease ([place]) Alerted nephrology of patient's been admitted Needs to resume hemodialysis   History of deep vein thrombosis (DVT) of lower extremity Continue Eliquis 5 mg p.o. twice daily   Acute on chronic diastolic CHF (congestive heart failure) ([place]) In the setting of likely hemodialysis patient endorsing shortness of breath and dyspnea on exertion.  -will need a hemodialysis       DVT prophylaxis:     Code Status: Prior Family Communication:   Disposition Plan:  Level of care: Telemetry Cardiac Status is: Inpatient Remains inpatient appropriate because: needs transfusions     Consultants:  renal   Subjective: Not sure where she got bed bug from  Objective: Vitals:   02/02/22 0737 02/02/22 0800 02/02/22 0830 02/02/22 0900  BP:   (!)  190/72 (!) 178/90  Pulse:  86 (!) 104 (!) 110  Resp:  15 (!) 25 (!) 22  Temp: 98.8 F (37.1 C)  98.6 F (37 C)   TempSrc:   Oral   SpO2:  100% 100% 100%   No intake or output data in the 24 hours ending 02/02/22 0950 There were no vitals filed for this visit.  Examination:   General: Appearance:    Obese female in no acute distress     Lungs:     respirations unlabored  Heart:    Tachycardic.  Multiple bed bug bites  MS:   All extremities are intact.    Neurologic:   Awake, alert       Data Reviewed: I have personally reviewed following labs and imaging studies  CBC: Recent Labs  Lab 02/01/22 2120  WBC 4.6  NEUTROABS 2.7  HGB 7.6*  HCT 23.3*  MCV 95.1  PLT 378   Basic Metabolic Panel: Recent Labs  Lab 02/01/22 2120  NA 143  K 4.6  CL 114*  CO2 22  GLUCOSE 124*  BUN 45*  CREATININE 4.67*  CALCIUM 8.8*   GFR: CrCl cannot be calculated (Unknown ideal weight.). Liver Function Tests: No results for input(s): "AST", "ALT", "ALKPHOS", "BILITOT", "PROT", "ALBUMIN" in the last 168 hours. No results for input(s): "LIPASE", "AMYLASE" in the last 168 hours. No results for input(s): "AMMONIA" in the last 168 hours. Coagulation Profile: No results for input(s): "INR", "PROTIME" in  the last 168 hours. Cardiac Enzymes: No results for input(s): "CKTOTAL", "CKMB", "CKMBINDEX", "TROPONINI" in the last 168 hours. BNP (last 3 results) No results for input(s): "PROBNP" in the last 8760 hours. HbA1C: No results for input(s): "HGBA1C" in the last 72 hours. CBG: Recent Labs  Lab 02/01/22 2256 02/02/22 0731  GLUCAP 107* 105*   Lipid Profile: No results for input(s): "CHOL", "HDL", "LDLCALC", "TRIG", "CHOLHDL", "LDLDIRECT" in the last 72 hours. Thyroid Function Tests: No results for input(s): "TSH", "T4TOTAL", "FREET4", "T3FREE", "THYROIDAB" in the last 72 hours. Anemia Panel: No results for input(s): "VITAMINB12", "FOLATE", "FERRITIN", "TIBC", "IRON", "RETICCTPCT"  in the last 72 hours. Sepsis Labs: No results for input(s): "PROCALCITON", "LATICACIDVEN" in the last 168 hours.  Recent Results (from the past 240 hour(s))  Resp Panel by RT-PCR (Flu A&B, Covid) Anterior Nasal Swab     Status: None   Collection Time: 02/02/22  6:30 AM   Specimen: Anterior Nasal Swab  Result Value Ref Range Status   SARS Coronavirus 2 by RT PCR NEGATIVE NEGATIVE Final    Comment: (NOTE) SARS-CoV-2 target nucleic acids are NOT DETECTED.  The SARS-CoV-2 RNA is generally detectable in upper respiratory specimens during the acute phase of infection. The lowest concentration of SARS-CoV-2 viral copies this assay can detect is 138 copies/mL. A negative result does not preclude SARS-Cov-2 infection and should not be used as the sole basis for treatment or other patient management decisions. A negative result may occur with  improper specimen collection/handling, submission of specimen other than nasopharyngeal swab, presence of viral mutation(s) within the areas targeted by this assay, and inadequate number of viral copies(<138 copies/mL). A negative result must be combined with clinical observations, patient history, and epidemiological information. The expected result is Negative.  Fact Sheet for Patients:  EntrepreneurPulse.com.au  Fact Sheet for Healthcare Providers:  IncredibleEmployment.be  This test is no t yet approved or cleared by the [place] FDA and  has been authorized for detection and/or diagnosis of SARS-CoV-2 by FDA under an Emergency Use Authorization (EUA). This EUA will remain  in effect (meaning this test can be used) for the duration of the COVID-19 declaration under Section 564(b)(1) of the Act, 21 U.S.C.section 360bbb-3(b)(1), unless the authorization is terminated  or revoked sooner.       Influenza A by PCR NEGATIVE NEGATIVE Final   Influenza B by PCR NEGATIVE NEGATIVE Final    Comment: (NOTE) The  Xpert Xpress SARS-CoV-2/FLU/RSV plus assay is intended as an aid in the diagnosis of influenza from Nasopharyngeal swab specimens and should not be used as a sole basis for treatment. Nasal washings and aspirates are unacceptable for Xpert Xpress SARS-CoV-2/FLU/RSV testing.  Fact Sheet for Patients: EntrepreneurPulse.com.au  Fact Sheet for Healthcare Providers: IncredibleEmployment.be  This test is not yet approved or cleared by the [place] FDA and has been authorized for detection and/or diagnosis of SARS-CoV-2 by FDA under an Emergency Use Authorization (EUA). This EUA will remain in effect (meaning this test can be used) for the duration of the COVID-19 declaration under Section 564(b)(1) of the Act, 21 U.S.C. section 360bbb-3(b)(1), unless the authorization is terminated or revoked.  Performed at [facility], [place] [address], [place], [place] [address]          Radiology Studies: DG Chest Portable 1 View  Result Date: 02/01/2022 CLINICAL DATA:  Fatigue EXAM: PORTABLE CHEST 1 VIEW COMPARISON:  Previous studies including the examination of 05/25/2021 FINDINGS: Transverse diameter of heart is slightly increased. Thoracic  aorta is tortuous and ectatic. Lung fields are clear of any infiltrates or pulmonary edema. There is no pleural effusion or pneumothorax. IMPRESSION: No active disease. Electronically Signed   By: Elmer Picker M.D.   On: 02/01/2022 18:52        Scheduled Meds:  hydrocerin   Topical BID   insulin aspart  0-5 Units Subcutaneous QHS   insulin aspart  0-6 Units Subcutaneous TID WC   Continuous Infusions:   LOS: 1 day    Time spent: 45 minutes spent on chart review, discussion with nursing staff, consultants, updating family and interview/physical exam; more than 50% of that time was spent in counseling and/or coordination of care.    Geradine Girt, DO [facility] Available via Epic  secure chat 7am-7pm After these hours, please refer to coverage provider listed on [email or web address] 02/02/2022, 9:50 AM

## 2022-02-02 NOTE — ED Notes (Addendum)
Per EDP charge nurse, since decon process has not complete it is to be completed right before transportation upstairs in order to decrease spread

## 2022-02-03 DIAGNOSIS — D649 Anemia, unspecified: Secondary | ICD-10-CM | POA: Diagnosis not present

## 2022-02-03 LAB — CBC
HCT: 31.3 % — ABNORMAL LOW (ref 36.0–46.0)
Hemoglobin: 10.2 g/dL — ABNORMAL LOW (ref 12.0–15.0)
MCH: 30.3 pg (ref 26.0–34.0)
MCHC: 32.6 g/dL (ref 30.0–36.0)
MCV: 92.9 fL (ref 80.0–100.0)
Platelets: 96 10*3/uL — ABNORMAL LOW (ref 150–400)
RBC: 3.37 MIL/uL — ABNORMAL LOW (ref 3.87–5.11)
RDW: 15.5 % (ref 11.5–15.5)
WBC: 4.6 10*3/uL (ref 4.0–10.5)
nRBC: 0 % (ref 0.0–0.2)

## 2022-02-03 LAB — TYPE AND SCREEN
ABO/RH(D): O POS
Antibody Screen: NEGATIVE
Unit division: 0
Unit division: 0

## 2022-02-03 LAB — ECHOCARDIOGRAM COMPLETE
AR max vel: 1.93 cm2
AV Peak grad: 12.4 mmHg
Ao pk vel: 1.76 m/s
Area-P 1/2: 2.51 cm2
S' Lateral: 2.4 cm

## 2022-02-03 LAB — GLUCOSE, CAPILLARY
Glucose-Capillary: 104 mg/dL — ABNORMAL HIGH (ref 70–99)
Glucose-Capillary: 144 mg/dL — ABNORMAL HIGH (ref 70–99)
Glucose-Capillary: 98 mg/dL (ref 70–99)

## 2022-02-03 LAB — BASIC METABOLIC PANEL
Anion gap: 7 (ref 5–15)
BUN: 44 mg/dL — ABNORMAL HIGH (ref 8–23)
CO2: 20 mmol/L — ABNORMAL LOW (ref 22–32)
Calcium: 8.3 mg/dL — ABNORMAL LOW (ref 8.9–10.3)
Chloride: 110 mmol/L (ref 98–111)
Creatinine, Ser: 4.36 mg/dL — ABNORMAL HIGH (ref 0.44–1.00)
GFR, Estimated: 10 mL/min — ABNORMAL LOW (ref >60)
Glucose, Bld: 106 mg/dL — ABNORMAL HIGH (ref 70–99)
Potassium: 4.4 mmol/L (ref 3.5–5.1)
Sodium: 137 mmol/L (ref 135–145)

## 2022-02-03 LAB — BPAM RBC
Blood Product Expiration Date: 202310132359
Blood Product Expiration Date: 202311032359
ISSUE DATE / TIME: 202310120242
ISSUE DATE / TIME: 202310120500
Unit Type and Rh: 5100
Unit Type and Rh: 9500

## 2022-02-03 LAB — HEPATITIS B SURFACE ANTIBODY, QUANTITATIVE: Hep B S AB Quant (Post): 419.8 m[IU]/mL (ref >9.9)

## 2022-02-03 MED ORDER — ENSURE ENLIVE PO LIQD
237.0000 mL | Freq: Two times a day (BID) | ORAL | Status: DC
  Administered 2022-02-04 – 2022-02-17 (×24): 237 mL via ORAL

## 2022-02-03 MED ORDER — HEPARIN SODIUM (PORCINE) 1000 UNIT/ML IJ SOLN
INTRAMUSCULAR | Status: AC
  Administered 2022-02-03: 2000 [IU]
  Filled 2022-02-03: qty 2

## 2022-02-03 MED ORDER — RENA-VITE PO TABS
1.0000 | ORAL_TABLET | Freq: Every day | ORAL | Status: DC
  Administered 2022-02-03 – 2022-02-17 (×15): 1 via ORAL
  Filled 2022-02-03 (×15): qty 1

## 2022-02-03 NOTE — Progress Notes (Signed)
[facility] Progress Note  Subjective: seen in HD unit, no c/o's   Vitals:   02/03/22 1030 02/03/22 1100 02/03/22 1130 02/03/22 1206  BP: (!) 147/72 117/69 (!) 140/65 (!) 140/70  Pulse: 82 85 85 86  Resp: '13 12 11 11  '$ Temp:      TempSrc:      SpO2:    99%  Weight:      Height:        Exam: Gen alert, no distress No jvd or bruits Chest clear bilat to bases RRR no MRG Abd soft ntnd no mass or ascites +bs Ext no LE edema Neuro is alert, Ox 3 , nf    RUA AVF+bruit    Home meds include - albuterol, amlodipine 10, apixaban, duloxetine, hydralazine 50 tid, norco prn, isosorbide mononitrate 30, linagliptin, metoprolol 25 qd, pantoprazole, pravastatin, ropinirole, prns/ supps/ vits    OP HD: [facility]WF [facility] 4h  400/ 500  78.6kg  3K/2.5 bath  Hep 2000  RUA AVF - last HD 9/27, post wt 79kg - last Hb 9.7 on 9/27 - mircera 30 q2, last 9/27 - venofer '100mg'$  weekly, last 9/27 - doxercalciferol 2 ug IV once per wk, last 9/27   Assessment/ Plan: Anemia symptomatic - Hb 7's, likely due to CKD. S/p 2 units prbc's yesterday. Getting ESA today w/ HD.  ESRD - missed about 2 wks of HD (see below), seen here for malaise, gen'd weakness. Azotemia not severe but suspect uremia is likely given she missed a lot of dialysis. Getting HD this morning, then next HD will be Monday.  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  MBD ckd - Ca in range, get alb/ phos. Cont IV vdra weekly.    Rob Uyen Eichholz 02/03/2022, 12:40 PM   Recent Labs  Lab 02/02/22 1056 02/03/22 0919  HGB 10.5* 10.2*  ALBUMIN 3.0*  --   CALCIUM 8.4* 8.3*  PHOS 4.1  --   CREATININE 4.53* 4.36*  K 4.5 4.4   Recent Labs  Lab 02/02/22 1056  IRON 75  TIBC 171*  FERRITIN 785*   Inpatient medications:   amLODipine  10 mg Oral q AM   apixaban  5 mg Oral BID   Chlorhexidine Gluconate Cloth  6 each Topical Q0600   Chlorhexidine Gluconate Cloth  6 each Topical Q0600   darbepoetin (ARANESP) injection - DIALYSIS  60 mcg Intravenous Q Fri-HD   doxercalciferol  2 mcg Intravenous Q Fri-HD   DULoxetine  60 mg Oral q AM   hydrALAZINE  50 mg Oral TID   hydrocerin   Topical BID   influenza vaccine adjuvanted  0.5 mL Intramuscular Tomorrow-1000   insulin aspart  0-5 Units Subcutaneous QHS   insulin aspart  0-6 Units Subcutaneous TID WC   isosorbide mononitrate  30 mg Oral q AM   lidocaine  1 patch Transdermal Q24H   metoprolol tartrate  25 mg Oral Daily   pantoprazole  20 mg Oral q AM   pravastatin  40 mg Oral q AM   rOPINIRole  0.25 mg Oral QHS   sodium chloride flush  3 mL Intravenous Q12H    sodium chloride     sodium chloride, acetaminophen **OR** acetaminophen, HYDROcodone-acetaminophen, hydrOXYzine, sodium chloride flush

## 2022-02-03 NOTE — Progress Notes (Signed)
PT Cancellation Note  Patient Details Name: Ann [name] MRN: [id number] DOB: [birth year]   Cancelled Treatment:    Reason Eval/Treat Not Completed: Patient at procedure or test/unavailable.  In HD, retry as time and pt allow.   Ramond Dial 02/03/2022, 10:14 AM  Mee Hives, PT PhD Acute Rehab Dept. Number: [place] [facility] [place]

## 2022-02-03 NOTE — Progress Notes (Signed)
PROGRESS NOTE    Ann [name]  QVZ:[id number] DOB: Jan 01, 1951 DOA: 02/01/2022 PCP: Antony Blackbird, MD  71/F with history of ESRD, type 2 diabetes mellitus, CAD, hypertension, chronic anemia, history of DVT on Eliquis, diastolic CHF presented to the ED with fatigue and weakness.  Missed 2 weeks of dialysis on account of bedbugs, transportation service would not take her. -In the ED hemoglobin was 7.6, potassium was normal -Transfused 2 units of PRBC  Subjective: Feels okay overall, waiting for dialysis  Assessment and Plan:  Normocytic anemia -Unfortunately anemia panel obtained after transfusion, iron stores okay -Likely primarily secondary to CKD -Transfused 2 units of PRBC yesterday,.  Patient denies any overt bleeding -Continue EPO with HD -Ambulate, PT OT eval  ESRD on hemodialysis -Missed 2 weeks of hemodialysis on account of loss of transportation due to bedbug infestation -Social work following -Nephrology following, HD today -Still makes fair amount of urine  History of DVT -Continue Eliquis  Acute on chronic diastolic CHF -Volume managed with HD  CAD -Stable continue metoprolol and Pravachol  Hypertension -Continue Norvasc, hydralazine, Imdur, Lopressor  DVT prophylaxis: Eliquis Code Status: Full code Family Communication: Discussed patient detail, no family at bedside Disposition Plan: To be determined  Consultants: Nephrology   Procedures:   Antimicrobials:    Objective: Vitals:   02/02/22 2027 02/03/22 0656 02/03/22 0707 02/03/22 0915  BP:  (!) 161/83  (!) 150/73  Pulse:  80  77  Resp:  18  11  Temp: 97.9 F (36.6 C)   97.8 F (36.6 C)  TempSrc: Oral     SpO2:    98%  Weight: 85.7 kg  85.8 kg 86 kg  Height: '5\' 2"'$  (1.575 m)  '5\' 2"'$  (1.575 m)    No intake or output data in the 24 hours ending 02/03/22 0929 Filed Weights   02/02/22 2027 02/03/22 0707 02/03/22 0915  Weight: 85.7 kg 85.8 kg 86 kg    Examination:  General exam: Appears  calm and comfortable  Respiratory system: decreased BS at bases Cardiovascular system: S1 & S2 heard, RRR.  Abd: nondistended, soft and nontender.Normal bowel sounds heard. Central nervous system: Alert and oriented. No focal neurological deficits. Extremities: no edema, right arm AV fistula Psychiatry:  Mood & affect appropriate.     Data Reviewed:   CBC: Recent Labs  Lab 02/01/22 2120 02/02/22 1056  WBC 4.6 5.0  NEUTROABS 2.7  --   HGB 7.6* 10.5*  HCT 23.3* 31.8*  MCV 95.1 92.2  PLT 169 92*   Basic Metabolic Panel: Recent Labs  Lab 02/01/22 2120 02/02/22 1056  NA 143 141  K 4.6 4.5  CL 114* 116*  CO2 22 19*  GLUCOSE 124* 190*  BUN 45* 44*  CREATININE 4.67* 4.53*  CALCIUM 8.8* 8.4*  MG  --  1.7  PHOS  --  4.1   GFR: Estimated Creatinine Clearance: 11.4 mL/min (A) (by C-G formula based on SCr of 4.53 mg/dL (H)). Liver Function Tests: Recent Labs  Lab 02/02/22 1056  AST 18  ALT 11  ALKPHOS 60  BILITOT 0.5  PROT 5.8*  ALBUMIN 3.0*   No results for input(s): "LIPASE", "AMYLASE" in the last 168 hours. No results for input(s): "AMMONIA" in the last 168 hours. Coagulation Profile: No results for input(s): "INR", "PROTIME" in the last 168 hours. Cardiac Enzymes: No results for input(s): "CKTOTAL", "CKMB", "CKMBINDEX", "TROPONINI" in the last 168 hours. BNP (last 3 results) No results for input(s): "PROBNP" in the last 8760 hours.  HbA1C: No results for input(s): "HGBA1C" in the last 72 hours. CBG: Recent Labs  Lab 02/02/22 0731 02/02/22 1240 02/02/22 1631 02/02/22 2013 02/03/22 0659  GLUCAP 105* 160* 116* 164* 104*   Lipid Profile: No results for input(s): "CHOL", "HDL", "LDLCALC", "TRIG", "CHOLHDL", "LDLDIRECT" in the last 72 hours. Thyroid Function Tests: No results for input(s): "TSH", "T4TOTAL", "FREET4", "T3FREE", "THYROIDAB" in the last 72 hours. Anemia Panel: Recent Labs    02/02/22 1056  VITAMINB12 200  FOLATE 8.5  FERRITIN 785*   TIBC 171*  IRON 75  RETICCTPCT 1.7   Urine analysis:    Component Value Date/Time   COLORURINE YELLOW 11/17/2020 2351   APPEARANCEUR CLOUDY (A) 11/17/2020 2351   LABSPEC 1.011 11/17/2020 2351   PHURINE 5.0 11/17/2020 2351   GLUCOSEU NEGATIVE 11/17/2020 2351   HGBUR NEGATIVE 11/17/2020 2351   BILIRUBINUR NEGATIVE 11/17/2020 2351   KETONESUR NEGATIVE 11/17/2020 2351   PROTEINUR 100 (A) 11/17/2020 2351   UROBILINOGEN 0.2 11/21/2011 1213   NITRITE NEGATIVE 11/17/2020 2351   LEUKOCYTESUR LARGE (A) 11/17/2020 2351   Sepsis Labs: '@LABRCNTIP'$ (procalcitonin:4,lacticidven:4)  ) Recent Results (from the past 240 hour(s))  Resp Panel by RT-PCR (Flu A&B, Covid) Anterior Nasal Swab     Status: None   Collection Time: 02/02/22  6:30 AM   Specimen: Anterior Nasal Swab  Result Value Ref Range Status   SARS Coronavirus 2 by RT PCR NEGATIVE NEGATIVE Final    Comment: (NOTE) SARS-CoV-2 target nucleic acids are NOT DETECTED.  The SARS-CoV-2 RNA is generally detectable in upper respiratory specimens during the acute phase of infection. The lowest concentration of SARS-CoV-2 viral copies this assay can detect is 138 copies/mL. A negative result does not preclude SARS-Cov-2 infection and should not be used as the sole basis for treatment or other patient management decisions. A negative result may occur with  improper specimen collection/handling, submission of specimen other than nasopharyngeal swab, presence of viral mutation(s) within the areas targeted by this assay, and inadequate number of viral copies(<138 copies/mL). A negative result must be combined with clinical observations, patient history, and epidemiological information. The expected result is Negative.  Fact Sheet for Patients:  EntrepreneurPulse.com.au  Fact Sheet for Healthcare Providers:  IncredibleEmployment.be  This test is no t yet approved or cleared by the [place] FDA and   has been authorized for detection and/or diagnosis of SARS-CoV-2 by FDA under an Emergency Use Authorization (EUA). This EUA will remain  in effect (meaning this test can be used) for the duration of the COVID-19 declaration under Section 564(b)(1) of the Act, 21 U.S.C.section 360bbb-3(b)(1), unless the authorization is terminated  or revoked sooner.       Influenza A by PCR NEGATIVE NEGATIVE Final   Influenza B by PCR NEGATIVE NEGATIVE Final    Comment: (NOTE) The Xpert Xpress SARS-CoV-2/FLU/RSV plus assay is intended as an aid in the diagnosis of influenza from Nasopharyngeal swab specimens and should not be used as a sole basis for treatment. Nasal washings and aspirates are unacceptable for Xpert Xpress SARS-CoV-2/FLU/RSV testing.  Fact Sheet for Patients: EntrepreneurPulse.com.au  Fact Sheet for Healthcare Providers: IncredibleEmployment.be  This test is not yet approved or cleared by the [place] FDA and has been authorized for detection and/or diagnosis of SARS-CoV-2 by FDA under an Emergency Use Authorization (EUA). This EUA will remain in effect (meaning this test can be used) for the duration of the COVID-19 declaration under Section 564(b)(1) of the Act, 21 U.S.C. section 360bbb-3(b)(1), unless the authorization is  terminated or revoked.  Performed at [facility], [place] [address], [place], [place] [address]      Radiology Studies: ECHOCARDIOGRAM COMPLETE  Result Date: 02/03/2022    ECHOCARDIOGRAM REPORT   Patient Name:   JOURNIEE FELDKAMP [place] Date of Exam: 02/02/2022 Medical Rec #:  [id number]     Height:       62.0 in Accession #:    [id number]    Weight:       174.2 lb Date of Birth:  01/04/1951     BSA:          1.803 m Patient Age:    11 years      BP:           173/67 mmHg Patient Gender: F             HR:           69 bpm. Exam Location:  Inpatient Procedure: 2D Echo, Cardiac Doppler and Color Doppler Indications:     CHF- Acute diastolic  History:        Patient has prior history of Echocardiogram examinations, most                 recent 04/04/2020. CAD; Risk Factors:Sleep Apnea, Dyslipidemia                 and Hypertension.  Sonographer:    Jefferey Pica Referring Phys: [place]  1. Left ventricular ejection fraction, by estimation, is 60 to 65%. The left ventricle has normal function. The left ventricle has no regional wall motion abnormalities. There is moderate concentric left ventricular hypertrophy. Left ventricular diastolic parameters are consistent with Grade I diastolic dysfunction (impaired relaxation). Elevated left ventricular end-diastolic pressure.  2. Right ventricular systolic function is normal. The right ventricular size is normal. There is normal pulmonary artery systolic pressure.  3. Left atrial size was severely dilated.  4. The mitral valve is degenerative. Trivial mitral valve regurgitation. No evidence of mitral stenosis.  5. The aortic valve is calcified. There is moderate calcification of the aortic valve. There is moderate thickening of the aortic valve. Aortic valve regurgitation is not visualized. Aortic valve sclerosis/calcification is present, without any evidence of aortic stenosis.  6. Aortic dilatation noted. There is mild dilatation of the ascending aorta, measuring 37 mm.  7. The inferior vena cava is normal in size with greater than 50% respiratory variability, suggesting right atrial pressure of 3 mmHg. FINDINGS  Left Ventricle: Left ventricular ejection fraction, by estimation, is 60 to 65%. The left ventricle has normal function. The left ventricle has no regional wall motion abnormalities. The left ventricular internal cavity size was normal in size. There is  moderate concentric left ventricular hypertrophy. Left ventricular diastolic parameters are consistent with Grade I diastolic dysfunction (impaired relaxation). Elevated left ventricular  end-diastolic pressure. Right Ventricle: The right ventricular size is normal. No increase in right ventricular wall thickness. Right ventricular systolic function is normal. There is normal pulmonary artery systolic pressure. The tricuspid regurgitant velocity is 2.29 m/s, and  with an assumed right atrial pressure of 3 mmHg, the estimated right ventricular systolic pressure is 02.4 mmHg. Left Atrium: Left atrial size was severely dilated. Right Atrium: Right atrial size was normal in size. Pericardium: There is no evidence of pericardial effusion. Mitral Valve: The mitral valve is degenerative in appearance. There is moderate thickening of the anterior and posterior mitral valve leaflet(s). Trivial mitral valve regurgitation. No evidence of mitral  valve stenosis. Tricuspid Valve: The tricuspid valve is normal in structure. Tricuspid valve regurgitation is mild . No evidence of tricuspid stenosis. Aortic Valve: The aortic valve is calcified. There is moderate calcification of the aortic valve. There is moderate thickening of the aortic valve. Aortic valve regurgitation is not visualized. Aortic valve sclerosis/calcification is present, without any  evidence of aortic stenosis. Aortic valve peak gradient measures 12.4 mmHg. Pulmonic Valve: The pulmonic valve was normal in structure. Pulmonic valve regurgitation is mild. No evidence of pulmonic stenosis. Aorta: Aortic dilatation noted. There is mild dilatation of the ascending aorta, measuring 37 mm. Venous: The inferior vena cava is normal in size with greater than 50% respiratory variability, suggesting right atrial pressure of 3 mmHg. IAS/Shunts: No atrial level shunt detected by color flow Doppler.  LEFT VENTRICLE PLAX 2D LVIDd:         3.80 cm   Diastology LVIDs:         2.40 cm   LV e' medial:    3.32 cm/s LV PW:         1.60 cm   LV E/e' medial:  20.8 LV IVS:        1.50 cm   LV e' lateral:   5.85 cm/s LVOT diam:     1.90 cm   LV E/e' lateral: 11.8 LV SV:          83 LV SV Index:   46 LVOT Area:     2.84 cm  RIGHT VENTRICLE             IVC RV Basal diam:  3.40 cm     IVC diam: 1.80 cm RV Mid diam:    3.10 cm RV S prime:     13.80 cm/s TAPSE (M-mode): 2.0 cm LEFT ATRIUM             Index        RIGHT ATRIUM           Index LA diam:        4.10 cm 2.27 cm/m   RA Area:     16.20 cm LA Vol (A2C):   75.9 ml 42.11 ml/m  RA Volume:   44.80 ml  24.85 ml/m LA Vol (A4C):   78.6 ml 43.61 ml/m LA Biplane Vol: 79.6 ml 44.16 ml/m  AORTIC VALVE                 PULMONIC VALVE AV Area (Vmax): 1.93 cm     PV Vmax:       0.84 m/s AV Vmax:        176.00 cm/s  PV Peak grad:  2.8 mmHg AV Peak Grad:   12.4 mmHg LVOT Vmax:      120.00 cm/s LVOT Vmean:     84.700 cm/s LVOT VTI:       0.291 m  AORTA Ao Root diam: 3.30 cm Ao Asc diam:  3.70 cm MITRAL VALVE                TRICUSPID VALVE MV Area (PHT): 2.51 cm     TV Peak grad:   26.6 mmHg MV Decel Time: 302 msec     TV Vmax:        2.58 m/s MV E velocity: 69.00 cm/s   TR Peak grad:   21.0 mmHg MV A velocity: 108.00 cm/s  TR Vmax:        229.00 cm/s MV E/A ratio:  0.64  SHUNTS                             Systemic VTI:  0.29 m                             Systemic Diam: 1.90 cm Skeet Latch MD Electronically signed by Skeet Latch MD Signature Date/Time: 02/03/2022/5:59:12 AM    Final    DG Chest Portable 1 View  Result Date: 02/01/2022 CLINICAL DATA:  Fatigue EXAM: PORTABLE CHEST 1 VIEW COMPARISON:  Previous studies including the examination of 05/25/2021 FINDINGS: Transverse diameter of heart is slightly increased. Thoracic aorta is tortuous and ectatic. Lung fields are clear of any infiltrates or pulmonary edema. There is no pleural effusion or pneumothorax. IMPRESSION: No active disease. Electronically Signed   By: Elmer Picker M.D.   On: 02/01/2022 18:52     Scheduled Meds:  amLODipine  10 mg Oral q AM   apixaban  5 mg Oral BID   Chlorhexidine Gluconate Cloth  6 each Topical Q0600    Chlorhexidine Gluconate Cloth  6 each Topical Q0600   darbepoetin (ARANESP) injection - DIALYSIS  60 mcg Intravenous Q Fri-HD   doxercalciferol  2 mcg Intravenous Q Fri-HD   DULoxetine  60 mg Oral q AM   hydrALAZINE  50 mg Oral TID   hydrocerin   Topical BID   influenza vaccine adjuvanted  0.5 mL Intramuscular Tomorrow-1000   insulin aspart  0-5 Units Subcutaneous QHS   insulin aspart  0-6 Units Subcutaneous TID WC   isosorbide mononitrate  30 mg Oral q AM   lidocaine  1 patch Transdermal Q24H   metoprolol tartrate  25 mg Oral Daily   pantoprazole  20 mg Oral q AM   pravastatin  40 mg Oral q AM   rOPINIRole  0.25 mg Oral QHS   sodium chloride flush  3 mL Intravenous Q12H   Continuous Infusions:  sodium chloride       LOS: 2 days    Time spent: 22mn    PDomenic Polite MD [facility]   02/03/2022, 9:29 AM

## 2022-02-03 NOTE — Progress Notes (Signed)
OT Cancellation Note  Patient Details Name: Ann [name] MRN: [id number] DOB: 1950/07/17   Cancelled Treatment:    Reason Eval/Treat Not Completed: Patient at procedure or test/ unavailable: Off floor for HD  Julien Girt 02/03/2022, 9:46 AM

## 2022-02-03 NOTE — Progress Notes (Signed)
Received patient in bed to unit.  Alert and oriented.  Informed consent signed and in chart.   Treatment initiated: 922a Treatment completed: 1300p  Patient tolerated well.  Transported back to the room  Alert, without acute distress.  Hand-off given to patient's nurse.   Access used: yes Access issues: no  Total UF removed: 2500 Medication(s) given: Aranesp 69mg, Hectorol 235m Post HD VS: 98.7, 85, 17, 135/67, RA 100, Post HD weight: 83.3kg   [facility]

## 2022-02-03 NOTE — Progress Notes (Signed)
Initial Nutrition Assessment  DOCUMENTATION CODES:   Not applicable  INTERVENTION:  Encourage adequate PO intake Ensure Enlive po BID, each supplement provides 350 kcal and 20 grams of protein. Renal MVI with minerals daily  NUTRITION DIAGNOSIS:   Increased nutrient needs related to acute illness as evidenced by estimated needs.  GOAL:   Patient will meet greater than or equal to 90% of their needs   MONITOR:   PO intake, Supplement acceptance, Labs, Weight trends  REASON FOR ASSESSMENT:   Consult, Malnutrition Screening Tool Other (Comment) (nutrition goals)  ASSESSMENT:   Pt admitted with fatigue and weakness r/t symptomatic anemia. Pt missed 2 weeks of HD d/t bedbugs leading to inability to utilize transportation services. PMH significant for ESRD on HD, T2DM, CAD, HTN, chronic anemia, DVT on eliquis, diastolic CHF.  S/p HD today. Next HD planned for Monday.   Unsuccessful attempt to reach pt via phone call to room. Unable to obtain detailed nutrition related history at this time.   Per flowsheet documentation, pt reports weight loss d/t COVID. Last documented HD was 9/27. Post HD weight on that date was 79 kg.  Post HD weight today- 83.3 kg  Up until this admission, her weight had been trending down. Uncertain of her usual dry weight so uncertain whether this was true dry weight loss versus fluctuations in fluid status.   Unable to determine whether there is a degree of malnutrition present. Will reassess once able to obtain more detailed history.   Medications: hectorol, SSI 0-5 units qhs, SSI 0-6 units TID, protonix  Labs: BUN 44, Cr 4.36, GFR 10, CBG's 104-164 x24 hours Na, K, Phos- WDL  Post HD net UF: 2.5L  NUTRITION - FOCUSED PHYSICAL EXAM: RD working remotely. Deferred to follow up.   Diet Order:   Diet Order             Diet Carb Modified Fluid consistency: Thin; Room service appropriate? Yes  Diet effective now                    EDUCATION NEEDS:   No education needs have been identified at this time  Skin:  Skin Assessment: Reviewed RN Assessment  Last BM:  10/12 (type 5)  Height:   Ht Readings from Last 1 Encounters:  02/03/22 '5\' 2"'$  (1.575 m)    Weight:   Wt Readings from Last 1 Encounters:  02/03/22 83.3 kg    Ideal Body Weight:  50 kg  BMI:  Body mass index is 33.59 kg/m.  Estimated Nutritional Needs:   Kcal:  1400-1600  Protein:  70-85g  Fluid:  1L + UOP  Clayborne Dana, RDN, LDN Clinical Nutrition

## 2022-02-04 DIAGNOSIS — D649 Anemia, unspecified: Secondary | ICD-10-CM | POA: Diagnosis not present

## 2022-02-04 LAB — COMPREHENSIVE METABOLIC PANEL
ALT: 12 U/L (ref 0–44)
AST: 19 U/L (ref 15–41)
Albumin: 3 g/dL — ABNORMAL LOW (ref 3.5–5.0)
Alkaline Phosphatase: 53 U/L (ref 38–126)
Anion gap: 9 (ref 5–15)
BUN: 28 mg/dL — ABNORMAL HIGH (ref 8–23)
CO2: 25 mmol/L (ref 22–32)
Calcium: 8.3 mg/dL — ABNORMAL LOW (ref 8.9–10.3)
Chloride: 99 mmol/L (ref 98–111)
Creatinine, Ser: 3.54 mg/dL — ABNORMAL HIGH (ref 0.44–1.00)
GFR, Estimated: 13 mL/min — ABNORMAL LOW (ref >60)
Glucose, Bld: 134 mg/dL — ABNORMAL HIGH (ref 70–99)
Potassium: 4.8 mmol/L (ref 3.5–5.1)
Sodium: 133 mmol/L — ABNORMAL LOW (ref 135–145)
Total Bilirubin: 0.5 mg/dL (ref 0.3–1.2)
Total Protein: 5.7 g/dL — ABNORMAL LOW (ref 6.5–8.1)

## 2022-02-04 LAB — CBC
HCT: 32.4 % — ABNORMAL LOW (ref 36.0–46.0)
Hemoglobin: 11.1 g/dL — ABNORMAL LOW (ref 12.0–15.0)
MCH: 31 pg (ref 26.0–34.0)
MCHC: 34.3 g/dL (ref 30.0–36.0)
MCV: 90.5 fL (ref 80.0–100.0)
Platelets: 93 10*3/uL — ABNORMAL LOW (ref 150–400)
RBC: 3.58 MIL/uL — ABNORMAL LOW (ref 3.87–5.11)
RDW: 15 % (ref 11.5–15.5)
WBC: 5.4 10*3/uL (ref 4.0–10.5)
nRBC: 0 % (ref 0.0–0.2)

## 2022-02-04 LAB — GLUCOSE, CAPILLARY
Glucose-Capillary: 111 mg/dL — ABNORMAL HIGH (ref 70–99)
Glucose-Capillary: 140 mg/dL — ABNORMAL HIGH (ref 70–99)
Glucose-Capillary: 156 mg/dL — ABNORMAL HIGH (ref 70–99)
Glucose-Capillary: 162 mg/dL — ABNORMAL HIGH (ref 70–99)

## 2022-02-04 NOTE — Progress Notes (Signed)
[facility] Progress Note  Subjective: seen in HD unit, no c/o's. Wonders if she really needs the dialysis, I am wondering too.   Vitals:   02/04/22 0114 02/04/22 0641 02/04/22 1032 02/04/22 1033  BP: (!) 163/68 (!) 146/79 (!) 140/61   Pulse: 72 74  77  Resp: 18   19  Temp: 98.5 F (36.9 C) 98.5 F (36.9 C)  98 F (36.7 C)  TempSrc: Oral Oral  Oral  SpO2: 100% 100%  100%  Weight:      Height:        Exam: Gen alert, no distress No jvd or bruits Chest clear bilat to bases RRR no MRG Abd soft ntnd no mass or ascites +bs Ext no LE edema Neuro is alert, Ox 3 , nf    RUA AVF+bruit    Home meds include - albuterol, amlodipine 10, apixaban, duloxetine, hydralazine 50 tid, norco prn, isosorbide mononitrate 30, linagliptin, metoprolol 25 qd, pantoprazole, pravastatin, ropinirole, prns/ supps/ vits    OP HD: MWF [facility] 4h  400/ 500  78.6kg  3K/2.5 bath  Hep 2000  RUA AVF - last HD 9/27, post wt 79kg - last Hb 9.7 on 9/27 - mircera 30 q2, last 9/27 - venofer '100mg'$  weekly, last 9/27 - doxercalciferol 2 ug IV once per wk, last 9/27   Assessment/ Plan: Anemia symptomatic - Hb 7's, likely due to CKD. S/p 2 units prbcs, and got darbe 60 ug w/ HD 10/13 here. Hb 10- 11 now ESRD - missed about 2 wks of HD (see below), seen here for malaise, gen'd weakness. Azotemia not severe given missed HD 2 wks. SP HD Friday, next HD Monday. Will get 24 hr creat clearance over the weekend to see if she might be able to come off HD.  HD transportation - Will consult CM to look into her bedbug situation. (bedbugs causing transport group to not provide a ride to/ from op HD).   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  MBD ckd - Ca in range, get alb/ phos. Cont IV vdra weekly.    Rob Jacksyn Beeks 02/04/2022, 10:56 AM   Recent Labs  Lab 02/02/22 1056 02/03/22 0919  02/04/22 0041  HGB 10.5* 10.2* 11.1*  ALBUMIN 3.0*  --  3.0*  CALCIUM 8.4* 8.3* 8.3*  PHOS 4.1  --   --   CREATININE 4.53* 4.36* 3.54*  K 4.5 4.4 4.8    Recent Labs  Lab 02/02/22 1056  IRON 75  TIBC 171*  FERRITIN 785*    Inpatient medications:  amLODipine  10 mg Oral q AM   apixaban  5 mg Oral BID   Chlorhexidine Gluconate Cloth  6 each Topical Q0600   Chlorhexidine Gluconate Cloth  6 each Topical Q0600   darbepoetin (ARANESP) injection - DIALYSIS  60 mcg Intravenous Q Fri-HD   doxercalciferol  2 mcg Intravenous Q Fri-HD   DULoxetine  60 mg Oral q AM   feeding supplement  237 mL Oral BID BM   hydrALAZINE  50 mg Oral TID   hydrocerin   Topical BID   influenza vaccine adjuvanted  0.5 mL Intramuscular Tomorrow-1000   insulin aspart  0-5 Units Subcutaneous QHS   insulin aspart  0-6 Units Subcutaneous TID WC   isosorbide mononitrate  30 mg Oral q AM   lidocaine  1  patch Transdermal Q24H   metoprolol tartrate  25 mg Oral Daily   multivitamin  1 tablet Oral QHS   pantoprazole  20 mg Oral q AM   pravastatin  40 mg Oral q AM   rOPINIRole  0.25 mg Oral QHS   sodium chloride flush  3 mL Intravenous Q12H    sodium chloride     sodium chloride, acetaminophen **OR** acetaminophen, HYDROcodone-acetaminophen, hydrOXYzine, sodium chloride flush

## 2022-02-04 NOTE — Progress Notes (Signed)
PROGRESS NOTE    Ann [name]  QMG:[id number] DOB: 1951/04/10 DOA: 02/01/2022 PCP: Antony Blackbird, MD  71/F with history of ESRD, type 2 diabetes mellitus, CAD, hypertension, chronic anemia, history of DVT on Eliquis, diastolic CHF presented to the ED with fatigue and weakness.  Missed 2 weeks of dialysis on account of bedbugs, transportation service would not take her. -In the ED hemoglobin was 7.6, potassium was normal -Transfused 2 units of PRBC  Subjective: -Feels okay overall, tolerated dialysis yesterday  Assessment and Plan:  Normocytic anemia -Unfortunately anemia panel obtained after transfusion, iron stores okay -Likely primarily secondary to CKD and hemodilution -Transfused 2 units of PRBC, no overt bleeding -Continue EPO with HD, hemoglobin is improved -Ambulate, PT OT eval -Discharge planning, social work consulted to assist with HD transportation,  ESRD on hemodialysis -Missed 2 weeks of hemodialysis on account of loss of transportation due to bedbug infestation -Social work following -Nephrology following, dialyzed yesterday -Still makes fair amount of urine -Patient tells me she has no friends or family that can transport her to hemodialysis  History of DVT -Continue Eliquis  Acute on chronic diastolic CHF -Volume managed with HD  CAD -Stable continue metoprolol and Pravachol  Hypertension -Continue Norvasc, hydralazine, Imdur, Lopressor  DVT prophylaxis: Eliquis Code Status: Full code Family Communication: Discussed patient detail, no family at bedside Disposition Plan: To be determined  Consultants: Nephrology   Procedures:   Antimicrobials:    Objective: Vitals:   02/03/22 2159 02/04/22 0114 02/04/22 0641 02/04/22 1032  BP: (!) 159/68 (!) 163/68 (!) 146/79 (!) 140/61  Pulse: 72 72 74   Resp: 14 18    Temp: 98.5 F (36.9 C) 98.5 F (36.9 C) 98.5 F (36.9 C)   TempSrc: Oral Oral Oral   SpO2: 100% 100% 100%   Weight:      Height:         Intake/Output Summary (Last 24 hours) at 02/04/2022 1035 Last data filed at 02/04/2022 1000 Gross per 24 hour  Intake 480 ml  Output 2500 ml  Net -2020 ml   Filed Weights   02/03/22 0707 02/03/22 0915 02/03/22 1300  Weight: 85.8 kg 86 kg 83.3 kg    Examination:  General exam: Pleasant female sitting up in bed, AAOx3, no distress HEENT: No JVD CVS: S1-S2, regular rhythm Lungs: Clear bilaterally Abdomen: Soft, nontender, bowel sounds present Extremities: No edema, right arm AV fistula  Psychiatry:  Mood & affect appropriate.     Data Reviewed:   CBC: Recent Labs  Lab 02/01/22 2120 02/02/22 1056 02/03/22 0919 02/04/22 0041  WBC 4.6 5.0 4.6 5.4  NEUTROABS 2.7  --   --   --   HGB 7.6* 10.5* 10.2* 11.1*  HCT 23.3* 31.8* 31.3* 32.4*  MCV 95.1 92.2 92.9 90.5  PLT 169 92* 96* 93*   Basic Metabolic Panel: Recent Labs  Lab 02/01/22 2120 02/02/22 1056 02/03/22 0919 02/04/22 0041  NA 143 141 137 133*  K 4.6 4.5 4.4 4.8  CL 114* 116* 110 99  CO2 22 19* 20* 25  GLUCOSE 124* 190* 106* 134*  BUN 45* 44* 44* 28*  CREATININE 4.67* 4.53* 4.36* 3.54*  CALCIUM 8.8* 8.4* 8.3* 8.3*  MG  --  1.7  --   --   PHOS  --  4.1  --   --    GFR: Estimated Creatinine Clearance: 14.4 mL/min (A) (by C-G formula based on SCr of 3.54 mg/dL (H)). Liver Function Tests: Recent Labs  Lab 02/02/22  1056 02/04/22 0041  AST 18 19  ALT 11 12  ALKPHOS 60 53  BILITOT 0.5 0.5  PROT 5.8* 5.7*  ALBUMIN 3.0* 3.0*   No results for input(s): "LIPASE", "AMYLASE" in the last 168 hours. No results for input(s): "AMMONIA" in the last 168 hours. Coagulation Profile: No results for input(s): "INR", "PROTIME" in the last 168 hours. Cardiac Enzymes: No results for input(s): "CKTOTAL", "CKMB", "CKMBINDEX", "TROPONINI" in the last 168 hours. BNP (last 3 results) No results for input(s): "PROBNP" in the last 8760 hours. HbA1C: No results for input(s): "HGBA1C" in the last 72 hours. CBG: Recent  Labs  Lab 02/02/22 2013 02/03/22 0659 02/03/22 1656 02/03/22 2155 02/04/22 0646  GLUCAP 164* 104* 144* 98 111*   Lipid Profile: No results for input(s): "CHOL", "HDL", "LDLCALC", "TRIG", "CHOLHDL", "LDLDIRECT" in the last 72 hours. Thyroid Function Tests: No results for input(s): "TSH", "T4TOTAL", "FREET4", "T3FREE", "THYROIDAB" in the last 72 hours. Anemia Panel: Recent Labs    02/02/22 1056  VITAMINB12 200  FOLATE 8.5  FERRITIN 785*  TIBC 171*  IRON 75  RETICCTPCT 1.7   Urine analysis:    Component Value Date/Time   COLORURINE YELLOW 11/17/2020 2351   APPEARANCEUR CLOUDY (A) 11/17/2020 2351   LABSPEC 1.011 11/17/2020 2351   PHURINE 5.0 11/17/2020 2351   GLUCOSEU NEGATIVE 11/17/2020 2351   HGBUR NEGATIVE 11/17/2020 2351   BILIRUBINUR NEGATIVE 11/17/2020 2351   KETONESUR NEGATIVE 11/17/2020 2351   PROTEINUR 100 (A) 11/17/2020 2351   UROBILINOGEN 0.2 11/21/2011 1213   NITRITE NEGATIVE 11/17/2020 2351   LEUKOCYTESUR LARGE (A) 11/17/2020 2351   Sepsis Labs: '@LABRCNTIP'$ (procalcitonin:4,lacticidven:4)  ) Recent Results (from the past 240 hour(s))  Resp Panel by RT-PCR (Flu A&B, Covid) Anterior Nasal Swab     Status: None   Collection Time: 02/02/22  6:30 AM   Specimen: Anterior Nasal Swab  Result Value Ref Range Status   SARS Coronavirus 2 by RT PCR NEGATIVE NEGATIVE Final    Comment: (NOTE) SARS-CoV-2 target nucleic acids are NOT DETECTED.  The SARS-CoV-2 RNA is generally detectable in upper respiratory specimens during the acute phase of infection. The lowest concentration of SARS-CoV-2 viral copies this assay can detect is 138 copies/mL. A negative result does not preclude SARS-Cov-2 infection and should not be used as the sole basis for treatment or other patient management decisions. A negative result may occur with  improper specimen collection/handling, submission of specimen other than nasopharyngeal swab, presence of viral mutation(s) within the areas  targeted by this assay, and inadequate number of viral copies(<138 copies/mL). A negative result must be combined with clinical observations, patient history, and epidemiological information. The expected result is Negative.  Fact Sheet for Patients:  EntrepreneurPulse.com.au  Fact Sheet for Healthcare Providers:  IncredibleEmployment.be  This test is no t yet approved or cleared by the [place] FDA and  has been authorized for detection and/or diagnosis of SARS-CoV-2 by FDA under an Emergency Use Authorization (EUA). This EUA will remain  in effect (meaning this test can be used) for the duration of the COVID-19 declaration under Section 564(b)(1) of the Act, 21 U.S.C.section 360bbb-3(b)(1), unless the authorization is terminated  or revoked sooner.       Influenza A by PCR NEGATIVE NEGATIVE Final   Influenza B by PCR NEGATIVE NEGATIVE Final    Comment: (NOTE) The Xpert Xpress SARS-CoV-2/FLU/RSV plus assay is intended as an aid in the diagnosis of influenza from Nasopharyngeal swab specimens and should not be used as a sole  basis for treatment. Nasal washings and aspirates are unacceptable for Xpert Xpress SARS-CoV-2/FLU/RSV testing.  Fact Sheet for Patients: EntrepreneurPulse.com.au  Fact Sheet for Healthcare Providers: IncredibleEmployment.be  This test is not yet approved or cleared by the [place] FDA and has been authorized for detection and/or diagnosis of SARS-CoV-2 by FDA under an Emergency Use Authorization (EUA). This EUA will remain in effect (meaning this test can be used) for the duration of the COVID-19 declaration under Section 564(b)(1) of the Act, 21 U.S.C. section 360bbb-3(b)(1), unless the authorization is terminated or revoked.  Performed at [facility], [place] [address], [place], [place] [address]      Radiology Studies: ECHOCARDIOGRAM COMPLETE  Result Date:  02/03/2022    ECHOCARDIOGRAM REPORT   Patient Name:   Ann [name] Date of Exam: 02/02/2022 Medical Rec #:  [id number]     Height:       62.0 in Accession #:    [id number]    Weight:       174.2 lb Date of Birth:  Oct 12, 1950     BSA:          1.803 m Patient Age:    64 years      BP:           173/67 mmHg Patient Gender: F             HR:           69 bpm. Exam Location:  Inpatient Procedure: 2D Echo, Cardiac Doppler and Color Doppler Indications:    CHF- Acute diastolic  History:        Patient has prior history of Echocardiogram examinations, most                 recent 04/04/2020. CAD; Risk Factors:Sleep Apnea, Dyslipidemia                 and Hypertension.  Sonographer:    Jefferey Pica Referring Phys: [place]  1. Left ventricular ejection fraction, by estimation, is 60 to 65%. The left ventricle has normal function. The left ventricle has no regional wall motion abnormalities. There is moderate concentric left ventricular hypertrophy. Left ventricular diastolic parameters are consistent with Grade I diastolic dysfunction (impaired relaxation). Elevated left ventricular end-diastolic pressure.  2. Right ventricular systolic function is normal. The right ventricular size is normal. There is normal pulmonary artery systolic pressure.  3. Left atrial size was severely dilated.  4. The mitral valve is degenerative. Trivial mitral valve regurgitation. No evidence of mitral stenosis.  5. The aortic valve is calcified. There is moderate calcification of the aortic valve. There is moderate thickening of the aortic valve. Aortic valve regurgitation is not visualized. Aortic valve sclerosis/calcification is present, without any evidence of aortic stenosis.  6. Aortic dilatation noted. There is mild dilatation of the ascending aorta, measuring 37 mm.  7. The inferior vena cava is normal in size with greater than 50% respiratory variability, suggesting right atrial pressure of 3 mmHg.  FINDINGS  Left Ventricle: Left ventricular ejection fraction, by estimation, is 60 to 65%. The left ventricle has normal function. The left ventricle has no regional wall motion abnormalities. The left ventricular internal cavity size was normal in size. There is  moderate concentric left ventricular hypertrophy. Left ventricular diastolic parameters are consistent with Grade I diastolic dysfunction (impaired relaxation). Elevated left ventricular end-diastolic pressure. Right Ventricle: The right ventricular size is normal. No increase in right ventricular wall thickness. Right ventricular  systolic function is normal. There is normal pulmonary artery systolic pressure. The tricuspid regurgitant velocity is 2.29 m/s, and  with an assumed right atrial pressure of 3 mmHg, the estimated right ventricular systolic pressure is 20.3 mmHg. Left Atrium: Left atrial size was severely dilated. Right Atrium: Right atrial size was normal in size. Pericardium: There is no evidence of pericardial effusion. Mitral Valve: The mitral valve is degenerative in appearance. There is moderate thickening of the anterior and posterior mitral valve leaflet(s). Trivial mitral valve regurgitation. No evidence of mitral valve stenosis. Tricuspid Valve: The tricuspid valve is normal in structure. Tricuspid valve regurgitation is mild . No evidence of tricuspid stenosis. Aortic Valve: The aortic valve is calcified. There is moderate calcification of the aortic valve. There is moderate thickening of the aortic valve. Aortic valve regurgitation is not visualized. Aortic valve sclerosis/calcification is present, without any  evidence of aortic stenosis. Aortic valve peak gradient measures 12.4 mmHg. Pulmonic Valve: The pulmonic valve was normal in structure. Pulmonic valve regurgitation is mild. No evidence of pulmonic stenosis. Aorta: Aortic dilatation noted. There is mild dilatation of the ascending aorta, measuring 37 mm. Venous: The inferior  vena cava is normal in size with greater than 50% respiratory variability, suggesting right atrial pressure of 3 mmHg. IAS/Shunts: No atrial level shunt detected by color flow Doppler.  LEFT VENTRICLE PLAX 2D LVIDd:         3.80 cm   Diastology LVIDs:         2.40 cm   LV e' medial:    3.32 cm/s LV PW:         1.60 cm   LV E/e' medial:  20.8 LV IVS:        1.50 cm   LV e' lateral:   5.85 cm/s LVOT diam:     1.90 cm   LV E/e' lateral: 11.8 LV SV:         83 LV SV Index:   46 LVOT Area:     2.84 cm  RIGHT VENTRICLE             IVC RV Basal diam:  3.40 cm     IVC diam: 1.80 cm RV Mid diam:    3.10 cm RV S prime:     13.80 cm/s TAPSE (M-mode): 2.0 cm LEFT ATRIUM             Index        RIGHT ATRIUM           Index LA diam:        4.10 cm 2.27 cm/m   RA Area:     16.20 cm LA Vol (A2C):   75.9 ml 42.11 ml/m  RA Volume:   44.80 ml  24.85 ml/m LA Vol (A4C):   78.6 ml 43.61 ml/m LA Biplane Vol: 79.6 ml 44.16 ml/m  AORTIC VALVE                 PULMONIC VALVE AV Area (Vmax): 1.93 cm     PV Vmax:       0.84 m/s AV Vmax:        176.00 cm/s  PV Peak grad:  2.8 mmHg AV Peak Grad:   12.4 mmHg LVOT Vmax:      120.00 cm/s LVOT Vmean:     84.700 cm/s LVOT VTI:       0.291 m  AORTA Ao Root diam: 3.30 cm Ao Asc diam:  3.70 cm MITRAL VALVE  TRICUSPID VALVE MV Area (PHT): 2.51 cm     TV Peak grad:   26.6 mmHg MV Decel Time: 302 msec     TV Vmax:        2.58 m/s MV E velocity: 69.00 cm/s   TR Peak grad:   21.0 mmHg MV A velocity: 108.00 cm/s  TR Vmax:        229.00 cm/s MV E/A ratio:  0.64                             SHUNTS                             Systemic VTI:  0.29 m                             Systemic Diam: 1.90 cm Skeet Latch MD Electronically signed by Skeet Latch MD Signature Date/Time: 02/03/2022/5:59:12 AM    Final      Scheduled Meds:  amLODipine  10 mg Oral q AM   apixaban  5 mg Oral BID   Chlorhexidine Gluconate Cloth  6 each Topical Q0600   Chlorhexidine Gluconate Cloth  6 each  Topical Q0600   darbepoetin (ARANESP) injection - DIALYSIS  60 mcg Intravenous Q Fri-HD   doxercalciferol  2 mcg Intravenous Q Fri-HD   DULoxetine  60 mg Oral q AM   feeding supplement  237 mL Oral BID BM   hydrALAZINE  50 mg Oral TID   hydrocerin   Topical BID   influenza vaccine adjuvanted  0.5 mL Intramuscular Tomorrow-1000   insulin aspart  0-5 Units Subcutaneous QHS   insulin aspart  0-6 Units Subcutaneous TID WC   isosorbide mononitrate  30 mg Oral q AM   lidocaine  1 patch Transdermal Q24H   metoprolol tartrate  25 mg Oral Daily   multivitamin  1 tablet Oral QHS   pantoprazole  20 mg Oral q AM   pravastatin  40 mg Oral q AM   rOPINIRole  0.25 mg Oral QHS   sodium chloride flush  3 mL Intravenous Q12H   Continuous Infusions:  sodium chloride       LOS: 3 days    Time spent: 53mn    PDomenic Polite MD [facility]   02/04/2022, 10:35 AM

## 2022-02-04 NOTE — Evaluation (Addendum)
Occupational Therapy Evaluation Patient Details Name: Ann [name] MRN: [id number] DOB: 1950-07-18 Today's Date: 02/04/2022   History of Present Illness Ann [name] is a pleasant 71 y.o. female who presented due to fatigue and missed 2 weeks of HD due to inability to utilize transport due to bed bugs. Pt found to be anemic and required 2 units of PRBC.   PMH: CAD with PCI to RCA in 2010, DVT, hypertension, type 2 diabetes mellitus, OSA not on CPAP, chronic back pain, and ESRD   Clinical Impression   Pt at PLOF lives with granddaughter in an apartment with a ramp entrance. Pt reports they only complete household distant ambulation with 4WW and requires assist with transportation for HD. Pt requires set up for UE ADLS and min guard for LE ADLS post set up. Pt noted to become very weak following sponge bath in session and required assist due to quality. Pt currently with functional limitations due to the deficits listed below (see OT Problem List).  Pt will benefit from skilled OT to increase their safety and independence with ADL and functional mobility for ADL to facilitate discharge to venue listed below.        Recommendations for follow up therapy are one component of a multi-disciplinary discharge planning process, led by the attending physician.  Recommendations may be updated based on patient status, additional functional criteria and insurance authorization.   Follow Up Recommendations  Home health OT    Assistance Recommended at Discharge Intermittent Supervision/Assistance  Patient can return home with the following A little help with walking and/or transfers;A little help with bathing/dressing/bathroom;Assistance with cooking/housework;Assist for transportation    Functional Status Assessment  Patient has had a recent decline in their functional status and demonstrates the ability to make significant improvements in function in a reasonable and predictable amount of time.   Equipment Recommendations  None recommended by OT    Recommendations for Other Services       Precautions / Restrictions Precautions Precautions: Fall Restrictions Weight Bearing Restrictions: No      Mobility Bed Mobility Overal bed mobility:  (Presented sitting at EOB)                  Transfers Overall transfer level: Needs assistance Equipment used: Rolling walker (2 wheels) Transfers: Sit to/from Stand Sit to Stand: Min guard                  Balance Overall balance assessment: Needs assistance Sitting-balance support: Feet supported Sitting balance-Leahy Scale: Good     Standing balance support: Bilateral upper extremity supported, During functional activity                               ADL either performed or assessed with clinical judgement   ADL Overall ADL's : Needs assistance/impaired Eating/Feeding: Independent;Sitting   Grooming: Wash/dry hands;Wash/dry face;Min guard;Cueing for safety;Cueing for sequencing;Sitting   Upper Body Bathing: Set up;Sitting   Lower Body Bathing: Min guard;Sit to/from stand   Upper Body Dressing : Set up;Sitting   Lower Body Dressing: Min guard;Sit to/from stand   Toilet Transfer: Agricultural engineer (2 wheels)   Toileting- Water quality scientist and Hygiene: Min guard;Sit to/from stand       Functional mobility during ADLs: Min guard;Rolling walker (2 wheels)       Vision Baseline Vision/History: 1 Wears glasses Vision Assessment?: No apparent visual deficits     Perception  Praxis      Pertinent Vitals/Pain Pain Assessment Pain Assessment: No/denies pain     Hand Dominance Right   Extremity/Trunk Assessment Upper Extremity Assessment Upper Extremity Assessment: Generalized weakness   Lower Extremity Assessment Lower Extremity Assessment: Defer to PT evaluation       Communication Communication Communication: No difficulties   Cognition Arousal/Alertness:  Awake/alert Behavior During Therapy: WFL for tasks assessed/performed Overall Cognitive Status: Within Functional Limits for tasks assessed                                       General Comments       Exercises     Shoulder Instructions      Home Living Family/patient expects to be discharged to:: Private residence Living Arrangements: Other relatives Available Help at Discharge: Family Type of Home: Apartment Home Access: Ramped entrance     Home Layout: One level     Bathroom Shower/Tub: Teacher, early years/pre: Standard Bathroom Accessibility: Yes   Home Equipment: Rollator (4 wheels);Rolling Walker (2 wheels);BSC/3in1;Shower seat          Prior Functioning/Environment Prior Level of Function : Independent/Modified Independent             Mobility Comments: Pt reports they only ambulate short home level distance. ADLs Comments: Pt reports sometimes needs help with showers.        OT Problem List: Decreased strength;Decreased activity tolerance;Impaired balance (sitting and/or standing);Decreased safety awareness;Decreased knowledge of use of DME or AE;Cardiopulmonary status limiting activity      OT Treatment/Interventions: Self-care/ADL training;Therapeutic activities;Patient/family education;Balance training    OT Goals(Current goals can be found in the care plan section) Acute Rehab OT Goals Patient Stated Goal: to get stronger OT Goal Formulation: With patient Time For Goal Achievement: 02/18/22 Potential to Achieve Goals: Good ADL Goals Pt Will Perform Lower Body Bathing: with modified independence;sit to/from stand Pt Will Perform Lower Body Dressing: with modified independence;sit to/from stand Pt Will Transfer to Toilet: with modified independence;ambulating Pt Will Perform Tub/Shower Transfer: with modified independence;ambulating;shower seat  OT Frequency: Min 2X/week    Co-evaluation              AM-PAC  OT "6 Clicks" Daily Activity     Outcome Measure Help from another person eating meals?: None Help from another person taking care of personal grooming?: None Help from another person toileting, which includes using toliet, bedpan, or urinal?: A Little Help from another person bathing (including washing, rinsing, drying)?: A Little Help from another person to put on and taking off regular upper body clothing?: None Help from another person to put on and taking off regular lower body clothing?: A Little 6 Click Score: 21   End of Session Equipment Utilized During Treatment: Rolling walker (2 wheels);Gait belt Nurse Communication: Mobility status  Activity Tolerance: Patient tolerated treatment well Patient left: in chair;with call bell/phone within reach;with chair alarm set  OT Visit Diagnosis: Unsteadiness on feet (R26.81);Other abnormalities of gait and mobility (R26.89);Repeated falls (R29.6);Muscle weakness (generalized) (M62.81)                Time: 1761-6073 OT Time Calculation (min): 54 min Charges:  OT General Charges $OT Visit: 1 Visit OT Evaluation $OT Eval Low Complexity: 1 Low OT Treatments $Self Care/Home Management : 38-52 mins  Joeseph Amor OTR/L  Acute Rehab Services  [phone] office number [phone] pager number  Joeseph Amor 02/04/2022, 9:34 AM

## 2022-02-04 NOTE — Evaluation (Signed)
Physical Therapy Evaluation Patient Details Name: Ann [name] MRN: [id number] DOB: 08-25-1950 Today's Date: 02/04/2022  History of Present Illness  Ann [name] is a pleasant 71 y.o. female who presented due to fatigue and missed 2 weeks of HD due to inability to utilize transport due to bed bugs. Pt required found to be anemic and required 2 units of PRBC.   PMH: CAD with PCI to RCA in 2010, DVT, hypertension, type 2 diabetes mellitus, OSA not on CPAP, chronic back pain, and ESRD.    Clinical Impression  Pt in bed upon arrival of PT, agreeable to evaluation at this time. Prior to admission the pt was mobilizing with use of rollator in the home, but reports significant limitations in endurance at baseline. The pt now presents with limitations in functional mobility, power, and endurance, but is likely close to her baseline mobility and will be safe to return home with intermittent assist from family when medically stable for d/c. The pt was able to complete multiple short bouts of ambulation in the room with use of RE and minG for safety but no overt LOB or assist needed. Will continue to benefit from skilled PT and mobility acutely to progress endurance and independence with mobility.       Recommendations for follow up therapy are one component of a multi-disciplinary discharge planning process, led by the attending physician.  Recommendations may be updated based on patient status, additional functional criteria and insurance authorization.  Follow Up Recommendations Home health PT      Assistance Recommended at Discharge Intermittent Supervision/Assistance  Patient can return home with the following  Assistance with cooking/housework;Direct supervision/assist for financial management;Assist for transportation;Help with stairs or ramp for entrance;Direct supervision/assist for medications management    Equipment Recommendations Rollator (4 wheels) (pt reports breaks not functioning on  her rollator)  Recommendations for Other Services       Functional Status Assessment Patient has had a recent decline in their functional status and demonstrates the ability to make significant improvements in function in a reasonable and predictable amount of time.     Precautions / Restrictions Precautions Precautions: Fall Restrictions Weight Bearing Restrictions: No      Mobility  Bed Mobility Overal bed mobility: Needs Assistance Bed Mobility: Supine to Sit     Supine to sit: Supervision     General bed mobility comments: increased time, no assist given    Transfers Overall transfer level: Needs assistance Equipment used: Rolling walker (2 wheels) Transfers: Sit to/from Stand Sit to Stand: Min guard           General transfer comment: minG with use of RW, cues for hand placement    Ambulation/Gait Ambulation/Gait assistance: Supervision Gait Distance (Feet): 20 Feet (+ 30 ft) Assistive device: Rolling walker (2 wheels) Gait Pattern/deviations: Step-through pattern, Decreased stride length, Trunk flexed Gait velocity: decreased Gait velocity interpretation: <1.31 ft/sec, indicative of household ambulator   General Gait Details: slow but steady, pt reports this is close to her baseline. progressive trunk flexion with fatigue     Balance Overall balance assessment: Needs assistance Sitting-balance support: Feet supported Sitting balance-Leahy Scale: Good     Standing balance support: Bilateral upper extremity supported, During functional activity Standing balance-Leahy Scale: Fair Standing balance comment: can static stand and reach outside [place]  expects to be discharged to:: Private residence Living Arrangements: Other relatives Available Help at Discharge: Family Type of Home: Apartment Home Access: Ramped entrance       [place]: One level [place]: [place] (4  wheels);Rolling Walker (2 wheels);BSC/3in1;Shower seat;Cane - single point;Grab bars - tub/shower;Grab bars - toilet Additional Comments: handicapped apt    Prior Function Prior Level of Function : Independent/Modified Independent             Mobility Comments: Pt reports they only ambulate short home level distance. ADLs Comments: Pt reports sometimes needs help with showers.     Hand Dominance   Dominant Hand: Right    Extremity/Trunk Assessment   Upper Extremity Assessment Upper Extremity Assessment: Defer to OT evaluation    Lower Extremity Assessment Lower Extremity Assessment: Overall WFL for tasks assessed    Cervical / Trunk Assessment Cervical / Trunk Assessment: Kyphotic  Communication   Communication: No difficulties  Cognition Arousal/Alertness: Awake/alert Behavior During Therapy: WFL for tasks assessed/performed Overall Cognitive Status: Within Functional Limits for tasks assessed                                          General Comments General comments (skin integrity, edema, etc.): VSS on RA        Assessment/Plan    PT Assessment Patient needs continued PT services  PT Problem List Decreased strength;Decreased range of motion;Decreased activity tolerance;Decreased balance;Decreased mobility;Cardiopulmonary status limiting activity       PT Treatment Interventions DME instruction;Gait training;Stair training;Functional mobility training;Therapeutic exercise;Therapeutic activities;Balance training;Patient/family education    PT Goals (Current goals can be found in the Care Plan section)  Acute Rehab PT Goals Patient Stated Goal: return home PT Goal Formulation: With patient Time For Goal Achievement: 02/18/22 Potential to Achieve Goals: Good    Frequency Min 3X/week        AM-PAC PT "6 Clicks" Mobility  Outcome Measure Help needed turning from your back to your side while in a flat bed without using bedrails?:  None Help needed moving from lying on your back to sitting on the side of a flat bed without using bedrails?: A Little Help needed moving to and from a bed to a chair (including a wheelchair)?: A Little Help needed standing up from a chair using your arms (e.g., wheelchair or bedside chair)?: A Little Help needed to walk in hospital room?: A Little Help needed climbing 3-5 steps with a railing? : A Little 6 Click Score: 19    End of Session Equipment Utilized During Treatment: Gait belt Activity Tolerance: Patient tolerated treatment well Patient left: in chair;with call bell/phone within reach;with chair alarm set Nurse Communication: Mobility status;Patient requests pain meds PT Visit Diagnosis: Unsteadiness on feet (R26.81);Other abnormalities of gait and mobility (R26.89);Muscle weakness (generalized) (M62.81)    Time: 1610-9604 PT Time Calculation (min) (ACUTE ONLY): 23 min   Charges:   PT Evaluation $PT Eval Low Complexity: 1 Low PT Treatments $Therapeutic Exercise: 8-22 mins        [place] Carbo, PT, DPT   Acute Rehabilitation Department  Sandra Cockayne 02/04/2022, 2:18 PM

## 2022-02-05 LAB — GLUCOSE, CAPILLARY
Glucose-Capillary: 121 mg/dL — ABNORMAL HIGH (ref 70–99)
Glucose-Capillary: 174 mg/dL — ABNORMAL HIGH (ref 70–99)
Glucose-Capillary: 140 mg/dL — ABNORMAL HIGH (ref 70–99)
Glucose-Capillary: 168 mg/dL — ABNORMAL HIGH (ref 70–99)

## 2022-02-05 LAB — CBC
HCT: 32.6 % — ABNORMAL LOW (ref 36.0–46.0)
Hemoglobin: 11.3 g/dL — ABNORMAL LOW (ref 12.0–15.0)
MCH: 31.1 pg (ref 26.0–34.0)
MCHC: 34.7 g/dL (ref 30.0–36.0)
MCV: 89.8 fL (ref 80.0–100.0)
Platelets: 105 10*3/uL — ABNORMAL LOW (ref 150–400)
RBC: 3.63 MIL/uL — ABNORMAL LOW (ref 3.87–5.11)
RDW: 14.6 % (ref 11.5–15.5)
WBC: 5.1 10*3/uL (ref 4.0–10.5)
nRBC: 0 % (ref 0.0–0.2)

## 2022-02-05 LAB — MRSA NEXT GEN BY PCR, NASAL: MRSA by PCR Next Gen: NOT DETECTED

## 2022-02-05 LAB — CREATININE CLEARANCE, URINE, 24 HOUR
Collection Interval-CRCL: 24 hours
Creatinine Clearance: 5 mL/min — ABNORMAL LOW (ref 75–115)
Creatinine, 24H Ur: 340 mg/d — ABNORMAL LOW (ref 600–1800)
Creatinine, Urine: 50 mg/dL
Urine Total Volume-CRCL: 680 mL

## 2022-02-05 MED ORDER — INSULIN ASPART 100 UNIT/ML IJ SOLN
0.0000 [IU] | Freq: Three times a day (TID) | INTRAMUSCULAR | Status: DC
  Administered 2022-02-05 – 2022-02-06 (×2): 2 [IU] via SUBCUTANEOUS
  Administered 2022-02-07 – 2022-02-09 (×4): 1 [IU] via SUBCUTANEOUS
  Administered 2022-02-11 – 2022-02-12 (×2): 2 [IU] via SUBCUTANEOUS
  Administered 2022-02-12: 1 [IU] via SUBCUTANEOUS
  Administered 2022-02-13: 2 [IU] via SUBCUTANEOUS
  Administered 2022-02-13: 1 [IU] via SUBCUTANEOUS
  Administered 2022-02-14: 2 [IU] via SUBCUTANEOUS
  Administered 2022-02-14 – 2022-02-15 (×2): 1 [IU] via SUBCUTANEOUS
  Administered 2022-02-16 (×2): 2 [IU] via SUBCUTANEOUS
  Administered 2022-02-17: 1 [IU] via SUBCUTANEOUS
  Administered 2022-02-17: 3 [IU] via SUBCUTANEOUS
  Administered 2022-02-18 (×2): 2 [IU] via SUBCUTANEOUS

## 2022-02-05 MED ORDER — CHLORHEXIDINE GLUCONATE CLOTH 2 % EX PADS
6.0000 | MEDICATED_PAD | Freq: Every day | CUTANEOUS | Status: DC
  Administered 2022-02-07 – 2022-02-09 (×2): 6 via TOPICAL

## 2022-02-05 MED ORDER — ORAL CARE MOUTH RINSE: 15.0000 mL | OROMUCOSAL | Status: DC | PRN

## 2022-02-05 NOTE — Progress Notes (Signed)
[facility] Progress Note  Subjective: seen in room.   Vitals:   02/04/22 1723 02/04/22 2034 02/05/22 0340 02/05/22 0341  BP: 139/66 (!) 147/59  (!) 160/64  Pulse:  80    Resp:  18  18  Temp:  98.4 F (36.9 C)  98.5 F (36.9 C)  TempSrc:  Oral  Oral  SpO2:  100% 99%   Weight:    84.4 kg  Height:        Exam: Gen alert, no distress No jvd or bruits Chest clear bilat to bases RRR no MRG Abd soft ntnd no mass or ascites +bs Ext no LE edema Neuro is alert, Ox 3 , nf    RUA AVF+bruit    Home meds include - albuterol, amlodipine 10, apixaban, duloxetine, hydralazine 50 tid, norco prn, isosorbide mononitrate 30, linagliptin, metoprolol 25 qd, pantoprazole, pravastatin, ropinirole, prns/ supps/ vits    OP HD: MWF [place] 4h  400/ 500  78.6kg  3K/2.5 bath  Hep 2000  RUA AVF - last HD 9/27, post wt 79kg - last Hb 9.7 on 9/27 - mircera 30 q2, last 9/27 - venofer '100mg'$  weekly, last 9/27 - doxercalciferol 2 ug IV once per wk, last 9/27   Assessment/ Plan: Anemia symptomatic - Hb 7's, likely due to CKD. S/p 2 units prbcs, and got darbe 60 ug w/ HD 10/13 here. Hb 10- 11 now ESRD - HD MWF. Had HD Friday here. Next HD tomorrow.  Need for continued dialysis - has been on x 1 year, she states she voids "a lot, all the time" and creat here was 4 on admission despite having missed HD 2 wks of HD. Question if patient may be able to come off of dialysis?  On a prior admit had the same issue. Standing wt here is under dry wt (recorded bed wts are inaccurate). Will get 24 hr creat clearance over the weekend.  HD transportation - have consulted Care Management to look into her bedbug situation. (bedbugs causing transportation company to refuse taking her to/ from outpt HD).   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  MBD ckd - Ca in range, get alb/ phos. Cont IV vdra  weekly.    Rob Trysten Bernard 02/05/2022, 7:40 AM   Recent Labs  Lab 02/02/22 1056 02/03/22 0919 02/04/22 0041 02/05/22 0108  HGB 10.5* 10.2* 11.1* 11.3*  ALBUMIN 3.0*  --  3.0*  --   CALCIUM 8.4* 8.3* 8.3*  --   PHOS 4.1  --   --   --   CREATININE 4.53* 4.36* 3.54*  --   K 4.5 4.4 4.8  --     Recent Labs  Lab 02/02/22 1056  IRON 75  TIBC 171*  FERRITIN 785*    Inpatient medications:  amLODipine  10 mg Oral q AM   apixaban  5 mg Oral BID   Chlorhexidine Gluconate Cloth  6 each Topical Q0600   darbepoetin (ARANESP) injection - DIALYSIS  60 mcg Intravenous Q Fri-HD   doxercalciferol  2 mcg Intravenous Q Fri-HD   DULoxetine  60 mg Oral q AM   feeding supplement  237 mL Oral BID BM   hydrALAZINE  50 mg Oral TID   hydrocerin   Topical BID   insulin aspart  0-5 Units Subcutaneous QHS   insulin aspart  0-6  Units Subcutaneous TID WC   isosorbide mononitrate  30 mg Oral q AM   lidocaine  1 patch Transdermal Q24H   metoprolol tartrate  25 mg Oral Daily   multivitamin  1 tablet Oral QHS   pantoprazole  20 mg Oral q AM   pravastatin  40 mg Oral q AM   rOPINIRole  0.25 mg Oral QHS   sodium chloride flush  3 mL Intravenous Q12H    sodium chloride     sodium chloride, acetaminophen **OR** acetaminophen, HYDROcodone-acetaminophen, hydrOXYzine, mouth rinse, sodium chloride flush

## 2022-02-05 NOTE — Discharge Instructions (Signed)
Information on my medicine - ELIQUIS (apixaban)  This medication education was reviewed with me or my healthcare representative as part of my discharge preparation.  The pharmacist that spoke with me during my hospital stay was:  Nadyne Gariepy, RPH-CPP  Why was Eliquis prescribed for you? Eliquis was prescribed to treat blood clots that may have been found in the veins of your legs (deep vein thrombosis) or in your lungs (pulmonary embolism) and to reduce the risk of them occurring again.  What do You need to know about Eliquis ? Continue Eliquis 5 mg tablet taken TWICE daily.  Eliquis may be taken with or without food.   Try to take the dose about the same time in the morning and in the evening. If you have difficulty swallowing the tablet whole please discuss with your pharmacist how to take the medication safely.  Take Eliquis exactly as prescribed and DO NOT stop taking Eliquis without talking to the doctor who prescribed the medication.  Stopping may increase your risk of developing a new blood clot.  Refill your prescription before you run out.  After discharge, you should have regular check-up appointments with your healthcare provider that is prescribing your Eliquis.    What do you do if you miss a dose? If a dose of ELIQUIS is not taken at the scheduled time, take it as soon as possible on the same day and twice-daily administration should be resumed. The dose should not be doubled to make up for a missed dose.  Important Safety Information A possible side effect of Eliquis is bleeding. You should call your healthcare provider right away if you experience any of the following: ? Bleeding from an injury or your nose that does not stop. ? Unusual colored urine (red or dark brown) or unusual colored stools (red or black). ? Unusual bruising for unknown reasons. ? A serious fall or if you hit your head (even if there is no bleeding).  Some medicines may interact with Eliquis  and might increase your risk of bleeding or clotting while on Eliquis. To help avoid this, consult your healthcare provider or pharmacist prior to using any new prescription or non-prescription medications, including herbals, vitamins, non-steroidal anti-inflammatory drugs (NSAIDs) and supplements.  This website has more information on Eliquis (apixaban): http://www.eliquis.com/eliquis/home       

## 2022-02-05 NOTE — Progress Notes (Signed)
Patient seen and examined, remains medically stable, no changes from my note yesterday -Main issue at this time is transportation to hemodialysis, patient lives with her granddaughter and has no friends or family that can transport her back and forth to HD -Social work team following, she is awaiting extermination of bedbugs, treatment of her apartment-management aware  Domenic Polite, MD

## 2022-02-06 DIAGNOSIS — D649 Anemia, unspecified: Secondary | ICD-10-CM | POA: Diagnosis not present

## 2022-02-06 LAB — BASIC METABOLIC PANEL
Anion gap: 7 (ref 5–15)
BUN: 47 mg/dL — ABNORMAL HIGH (ref 8–23)
CO2: 25 mmol/L (ref 22–32)
Calcium: 9 mg/dL (ref 8.9–10.3)
Chloride: 100 mmol/L (ref 98–111)
Creatinine, Ser: 4.93 mg/dL — ABNORMAL HIGH (ref 0.44–1.00)
GFR, Estimated: 9 mL/min — ABNORMAL LOW (ref >60)
Glucose, Bld: 159 mg/dL — ABNORMAL HIGH (ref 70–99)
Potassium: 5.3 mmol/L — ABNORMAL HIGH (ref 3.5–5.1)
Sodium: 132 mmol/L — ABNORMAL LOW (ref 135–145)

## 2022-02-06 LAB — CBC
HCT: 33.5 % — ABNORMAL LOW (ref 36.0–46.0)
Hemoglobin: 10.9 g/dL — ABNORMAL LOW (ref 12.0–15.0)
MCH: 30.1 pg (ref 26.0–34.0)
MCHC: 32.5 g/dL (ref 30.0–36.0)
MCV: 92.5 fL (ref 80.0–100.0)
Platelets: 120 10*3/uL — ABNORMAL LOW (ref 150–400)
RBC: 3.62 MIL/uL — ABNORMAL LOW (ref 3.87–5.11)
RDW: 14.5 % (ref 11.5–15.5)
WBC: 5.2 10*3/uL (ref 4.0–10.5)
nRBC: 0 % (ref 0.0–0.2)

## 2022-02-06 LAB — GLUCOSE, CAPILLARY
Glucose-Capillary: 141 mg/dL — ABNORMAL HIGH (ref 70–99)
Glucose-Capillary: 118 mg/dL — ABNORMAL HIGH (ref 70–99)
Glucose-Capillary: 207 mg/dL — ABNORMAL HIGH (ref 70–99)
Glucose-Capillary: 146 mg/dL — ABNORMAL HIGH (ref 70–99)

## 2022-02-06 MED ORDER — HEPARIN SODIUM (PORCINE) 1000 UNIT/ML IJ SOLN
INTRAMUSCULAR | Status: AC
  Administered 2022-02-06: 2000 [IU] via INTRAVENOUS
  Filled 2022-02-06: qty 2

## 2022-02-06 NOTE — Progress Notes (Signed)
PROGRESS NOTE    Ann [name]  ZSW:[id number] DOB: [birth year] DOA: 02/01/2022 PCP: Antony Blackbird, MD  71/F with history of ESRD, type 2 diabetes mellitus, CAD, hypertension, chronic anemia, history of DVT on Eliquis, diastolic CHF presented to the ED with fatigue and weakness.  Missed 2 weeks of dialysis on account of bedbugs, transportation service would not take her. -In the ED hemoglobin was 7.6, potassium was normal -Transfused 2 units of PRBC  Subjective: -Feels well, denies any complaints, doing going to HD today  Assessment and Plan:  Normocytic anemia -Unfortunately anemia panel obtained after transfusion, iron stores okay -Likely primarily secondary to CKD and hemodilution -Transfused 2 units of PRBC, no overt bleeding -Continue EPO with HD, hemoglobin improved and stable -Ambulate, PT OT eval> Home health PT recommended -Discharge planning, social work consulted to assist with HD transportation, patient reports not having any way to go back and forth to HD without transportation  ESRD on hemodialysis -Missed 2 weeks of hemodialysis on account of loss of transportation due to bedbug infestation -Social work following -Nephrology following, dialysis today -Still makes fair amount of urine -Patient tells me she has no friends or family that can transport her to hemodialysis, TOC following to assist with HD transportation, bedbug extermination  History of DVT -Continue Eliquis  Acute on chronic diastolic CHF -Volume managed with HD  CAD -Stable continue metoprolol and Pravachol  Hypertension -Continue Norvasc, hydralazine, Imdur, Lopressor  DVT prophylaxis: Eliquis Code Status: Full code Family Communication: Discussed patient detail, no family at bedside Disposition Plan: Home when HD transportation arranged  Consultants: Nephrology   Procedures:   Antimicrobials:    Objective: Vitals:   02/06/22 1000 02/06/22 1030 02/06/22 1042 02/06/22 1108  BP: (!)  158/74 (!) 162/72 (!) 162/67   Pulse: 88 88 88   Resp: '15 18 11   '$ Temp:   98.1 F (36.7 C)   TempSrc:   Oral   SpO2: 100% 100% 100%   Weight:    83.6 kg  Height:        Intake/Output Summary (Last 24 hours) at 02/06/2022 1119 Last data filed at 02/06/2022 1042 Gross per 24 hour  Intake 583 ml  Output 1550 ml  Net -967 ml   Filed Weights   02/06/22 0545 02/06/22 0728 02/06/22 1108  Weight: 85 kg 85.3 kg 83.6 kg    Examination:  General exam: Pleasant female sitting up in bed, AAOx3, no distress HEENT: No JVD CVS: S1-S2, regular rhythm Lungs: Clear bilaterally Abdomen: Soft, nontender, bowel sounds present Extremities: No edema, right arm AV fistula Psychiatry:  Mood & affect appropriate.     Data Reviewed:   CBC: Recent Labs  Lab 02/01/22 2120 02/02/22 1056 02/03/22 0919 02/04/22 0041 02/05/22 0108 02/06/22 0032  WBC 4.6 5.0 4.6 5.4 5.1 5.2  NEUTROABS 2.7  --   --   --   --   --   HGB 7.6* 10.5* 10.2* 11.1* 11.3* 10.9*  HCT 23.3* 31.8* 31.3* 32.4* 32.6* 33.5*  MCV 95.1 92.2 92.9 90.5 89.8 92.5  PLT 169 92* 96* 93* 105* 322*   Basic Metabolic Panel: Recent Labs  Lab 02/01/22 2120 02/02/22 1056 02/03/22 0919 02/04/22 0041 02/06/22 0032  NA 143 141 137 133* 132*  K 4.6 4.5 4.4 4.8 5.3*  CL 114* 116* 110 99 100  CO2 22 19* 20* 25 25  GLUCOSE 124* 190* 106* 134* 159*  BUN 45* 44* 44* 28* 47*  CREATININE 4.67* 4.53* 4.36* 3.54* 4.93*  CALCIUM 8.8* 8.4* 8.3* 8.3* 9.0  MG  --  1.7  --   --   --   PHOS  --  4.1  --   --   --    GFR: Estimated Creatinine Clearance: 10.3 mL/min (A) (by C-G formula based on SCr of 4.93 mg/dL (H)). Liver Function Tests: Recent Labs  Lab 02/02/22 1056 02/04/22 0041  AST 18 19  ALT 11 12  ALKPHOS 60 53  BILITOT 0.5 0.5  PROT 5.8* 5.7*  ALBUMIN 3.0* 3.0*   No results for input(s): "LIPASE", "AMYLASE" in the last 168 hours. No results for input(s): "AMMONIA" in the last 168 hours. Coagulation Profile: No results  for input(s): "INR", "PROTIME" in the last 168 hours. Cardiac Enzymes: No results for input(s): "CKTOTAL", "CKMB", "CKMBINDEX", "TROPONINI" in the last 168 hours. BNP (last 3 results) No results for input(s): "PROBNP" in the last 8760 hours. HbA1C: No results for input(s): "HGBA1C" in the last 72 hours. CBG: Recent Labs  Lab 02/05/22 0559 02/05/22 1106 02/05/22 1619 02/05/22 2044 02/06/22 0541  GLUCAP 121* 174* 140* 168* 141*   Lipid Profile: No results for input(s): "CHOL", "HDL", "LDLCALC", "TRIG", "CHOLHDL", "LDLDIRECT" in the last 72 hours. Thyroid Function Tests: No results for input(s): "TSH", "T4TOTAL", "FREET4", "T3FREE", "THYROIDAB" in the last 72 hours. Anemia Panel: No results for input(s): "VITAMINB12", "FOLATE", "FERRITIN", "TIBC", "IRON", "RETICCTPCT" in the last 72 hours.  Urine analysis:    Component Value Date/Time   COLORURINE YELLOW 11/17/2020 2351   APPEARANCEUR CLOUDY (A) 11/17/2020 2351   LABSPEC 1.011 11/17/2020 2351   PHURINE 5.0 11/17/2020 2351   GLUCOSEU NEGATIVE 11/17/2020 2351   HGBUR NEGATIVE 11/17/2020 2351   BILIRUBINUR NEGATIVE 11/17/2020 2351   KETONESUR NEGATIVE 11/17/2020 2351   PROTEINUR 100 (A) 11/17/2020 2351   UROBILINOGEN 0.2 11/21/2011 1213   NITRITE NEGATIVE 11/17/2020 2351   LEUKOCYTESUR LARGE (A) 11/17/2020 2351   Sepsis Labs: '@LABRCNTIP'$ (procalcitonin:4,lacticidven:4)  ) Recent Results (from the past 240 hour(s))  Resp Panel by RT-PCR (Flu A&B, Covid) Anterior Nasal Swab     Status: None   Collection Time: 02/02/22  6:30 AM   Specimen: Anterior Nasal Swab  Result Value Ref Range Status   SARS Coronavirus 2 by RT PCR NEGATIVE NEGATIVE Final    Comment: (NOTE) SARS-CoV-2 target nucleic acids are NOT DETECTED.  The SARS-CoV-2 RNA is generally detectable in upper respiratory specimens during the acute phase of infection. The lowest concentration of SARS-CoV-2 viral copies this assay can detect is 138 copies/mL. A negative  result does not preclude SARS-Cov-2 infection and should not be used as the sole basis for treatment or other patient management decisions. A negative result may occur with  improper specimen collection/handling, submission of specimen other than nasopharyngeal swab, presence of viral mutation(s) within the areas targeted by this assay, and inadequate number of viral copies(<138 copies/mL). A negative result must be combined with clinical observations, patient history, and epidemiological information. The expected result is Negative.  Fact Sheet for Patients:  EntrepreneurPulse.com.au  Fact Sheet for Healthcare Providers:  IncredibleEmployment.be  This test is no t yet approved or cleared by the [place] FDA and  has been authorized for detection and/or diagnosis of SARS-CoV-2 by FDA under an Emergency Use Authorization (EUA). This EUA will remain  in effect (meaning this test can be used) for the duration of the COVID-19 declaration under Section 564(b)(1) of the Act, 21 U.S.C.section 360bbb-3(b)(1), unless the authorization is terminated  or revoked sooner.  Influenza A by PCR NEGATIVE NEGATIVE Final   Influenza B by PCR NEGATIVE NEGATIVE Final    Comment: (NOTE) The Xpert Xpress SARS-CoV-2/FLU/RSV plus assay is intended as an aid in the diagnosis of influenza from Nasopharyngeal swab specimens and should not be used as a sole basis for treatment. Nasal washings and aspirates are unacceptable for Xpert Xpress SARS-CoV-2/FLU/RSV testing.  Fact Sheet for Patients: EntrepreneurPulse.com.au  Fact Sheet for Healthcare Providers: IncredibleEmployment.be  This test is not yet approved or cleared by the [place] FDA and has been authorized for detection and/or diagnosis of SARS-CoV-2 by FDA under an Emergency Use Authorization (EUA). This EUA will remain in effect (meaning this test can be used)  for the duration of the COVID-19 declaration under Section 564(b)(1) of the Act, 21 U.S.C. section 360bbb-3(b)(1), unless the authorization is terminated or revoked.  Performed at [facility], [place] [address], [place], [place] [address]   MRSA Next Gen by PCR, Nasal     Status: None   Collection Time: 02/05/22  3:51 AM   Specimen: Nasal Mucosa; Nasal Swab  Result Value Ref Range Status   MRSA by PCR Next Gen NOT DETECTED NOT DETECTED Final    Comment: (NOTE) The GeneXpert MRSA Assay (FDA approved for NASAL specimens only), is one component of a comprehensive MRSA colonization surveillance program. It is not intended to diagnose MRSA infection nor to guide or monitor treatment for MRSA infections. Test performance is not FDA approved in patients less than 101 years old. Performed at [facility], [place] [address] [place], [place] [address]      Radiology Studies: No results found.   Scheduled Meds:  amLODipine  10 mg Oral q AM   apixaban  5 mg Oral BID   Chlorhexidine Gluconate Cloth  6 each Topical Q0600   darbepoetin (ARANESP) injection - DIALYSIS  60 mcg Intravenous Q Fri-HD   doxercalciferol  2 mcg Intravenous Q Fri-HD   DULoxetine  60 mg Oral q AM   feeding supplement  237 mL Oral BID BM   hydrALAZINE  50 mg Oral TID   hydrocerin   Topical BID   insulin aspart  0-5 Units Subcutaneous QHS   insulin aspart  0-6 Units Subcutaneous TID WC   isosorbide mononitrate  30 mg Oral q AM   lidocaine  1 patch Transdermal Q24H   metoprolol tartrate  25 mg Oral Daily   multivitamin  1 tablet Oral QHS   pantoprazole  20 mg Oral q AM   pravastatin  40 mg Oral q AM   rOPINIRole  0.25 mg Oral QHS   sodium chloride flush  3 mL Intravenous Q12H   Continuous Infusions:  sodium chloride       LOS: 5 days    Time spent: 69mn    PDomenic Polite MD [facility]   02/06/2022, 11:19 AM

## 2022-02-06 NOTE — Progress Notes (Signed)
OT Cancellation Note  Patient Details Name: Ann [name] MRN: [id number] DOB: 08/16/1950   Cancelled Treatment:    Reason Eval/Treat Not Completed: Patient at procedure or test/ unavailable (Patient off unit in HD.  Will attempt later today as schedule permits.) Lodema Hong, [place]  Office [phone]  Trixie Dredge 02/06/2022, 7:52 AM

## 2022-02-06 NOTE — TOC Progression Note (Signed)
Transition of Care [facility]) - Progression Note    Patient Details  Name: Ann [name] MRN: [id number] Date of Birth: 1951-02-07  [facility] CM/SW Contact  Zenon Mayo, RN Phone Number: 02/06/2022, 10:15 AM  Clinical Narrative:    NCM spoke with daughter , Babs Sciara, she states her niece lives with the patient who is the patient's grand daughter but she does not have transportation her self, she takes the bus everywhere she goes and she is in school at [employer].  Felecia states she does not have transportation either. She states Tim who is listed as a contact on file is a nobody and should not be listed as a contact, will speak with patient regarding that.  Felecia states she thinks patient was taking big wheel transport to dialsysis she is not really sure.  She states she is calling the housing authorities now to see if they could get an exterminator out to her mom's place to treat the bed bugs.          Expected Discharge Plan and Services                                                 Social Determinants of Health (SDOH) Interventions    Readmission Risk Interventions     No data to display

## 2022-02-06 NOTE — Progress Notes (Signed)
Received patient in bed to unit.  Alert and oriented.  Informed consent signed and in chart.   Treatment initiated: 3507  Treatment completed: 1042  Patient tolerated well.  Transported back to the room  Alert, without acute distress.  Hand-off given to patient's nurse.   Access used: RAVF Access issues: None  Total UF removed: 1.5L Medication(s) given: None Post HD VS: 162/67, HR 89, Resp 12, SPO2 100% RA Post HD weight: 83.6   [facility]

## 2022-02-06 NOTE — Progress Notes (Signed)
[facility] Progress Note  Subjective: seen in HD  Vitals:   02/06/22 0830 02/06/22 0900 02/06/22 0930 02/06/22 1000  BP: 137/66 (!) 149/72 (!) 151/65 (!) 158/74  Pulse: 83 87 87 88  Resp: '13 10 17 15  '$ Temp:      TempSrc:      SpO2: 100% 100% 99% 100%  Weight:      Height:        Exam: Gen alert, no distress No jvd or bruits Chest clear bilat to bases RRR no MRG Abd soft ntnd no mass or ascites +bs Ext no LE edema Neuro is alert, Ox 3 , nf    RUA AVF+bruit    Home meds include - albuterol, amlodipine 10, apixaban, duloxetine, hydralazine 50 tid, norco prn, isosorbide mononitrate 30, linagliptin, metoprolol 25 qd, pantoprazole, pravastatin, ropinirole, prns/ supps/ vits    OP HD: MWF [place] 4h  400/ 500  78.6kg  3K/2.5 bath  Hep 2000  RUA AVF - last HD 9/27, post wt 79kg - last Hb 9.7 on 9/27 - mircera 30 q2, last 9/27 - venofer '100mg'$  weekly, last 9/27 - doxercalciferol 2 ug IV once per wk, last 9/27   Assessment/ Plan: Anemia symptomatic - Hb 7's, likely due to CKD. S/p 2 units prbcs, and got darbe 60 ug w/ HD 10/13 here. Hb 10- 11 now ESRD - HD MWF. Had HD Friday here. Next HD today on schedule via AVF  Need for continued dialysis - has been on x 1 year, she states she voids "a lot, all the time" and creat here was 4 on admission despite having missed HD 2 wks of HD. Question if patient may be able to come off of dialysis?  On a prior admit had the same issue. Standing wt here is under dry wt (recorded bed wts are inaccurate). Will get 24 hr creat clearance over the weekend.   Crt clearance only came out at 5 and with albumin of 3.0 dont think she could come off of HD HD transportation - have consulted Care Management to look into her bedbug situation. (bedbugs causing transportation company to refuse taking her to/ from outpt HD).   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 Has been stable since transfusion  MBD ckd - Ca in range,  phos also good. Cont IV vdra weekly.  Dispo-  could be discharged from our standpoint    Louis Meckel  02/06/2022, 10:18 AM   Recent Labs  Lab 02/02/22 1056 02/03/22 0919 02/04/22 0041 02/05/22 0108 02/06/22 0032  HGB 10.5*   < > 11.1* 11.3* 10.9*  ALBUMIN 3.0*  --  3.0*  --   --   CALCIUM 8.4*   < > 8.3*  --  9.0  PHOS 4.1  --   --   --   --   CREATININE 4.53*   < > 3.54*  --  4.93*  K 4.5   < > 4.8  --  5.3*   < > = values in this interval not displayed.   Recent Labs  Lab 02/02/22 1056  IRON 75  TIBC 171*  FERRITIN 785*   Inpatient medications:  amLODipine  10 mg Oral q AM   apixaban  5 mg Oral BID  Chlorhexidine Gluconate Cloth  6 each Topical Q0600   darbepoetin (ARANESP) injection - DIALYSIS  60 mcg Intravenous Q Fri-HD   doxercalciferol  2 mcg Intravenous Q Fri-HD   DULoxetine  60 mg Oral q AM   feeding supplement  237 mL Oral BID BM   hydrALAZINE  50 mg Oral TID   hydrocerin   Topical BID   insulin aspart  0-5 Units Subcutaneous QHS   insulin aspart  0-6 Units Subcutaneous TID WC   isosorbide mononitrate  30 mg Oral q AM   lidocaine  1 patch Transdermal Q24H   metoprolol tartrate  25 mg Oral Daily   multivitamin  1 tablet Oral QHS   pantoprazole  20 mg Oral q AM   pravastatin  40 mg Oral q AM   rOPINIRole  0.25 mg Oral QHS   sodium chloride flush  3 mL Intravenous Q12H    sodium chloride     sodium chloride, acetaminophen **OR** acetaminophen, HYDROcodone-acetaminophen, hydrOXYzine, mouth rinse, sodium chloride flush

## 2022-02-06 NOTE — TOC Progression Note (Addendum)
Transition of Care [facility]) - Progression Note    Patient Details  Name: Ann [name] MRN: [id number] Date of Birth: 1950-12-18  Transition of Care [facility]) CM/SW [place], [place] Phone Number: 02/06/2022, 12:52 PM  Clinical Narrative:     Pt use [place] transport through her medicaid transportation who will not transport pt with bed bugs.   CSW contacted [facility] to inquire about transport options; they are unable to provided pt transport who have bed bugs in their home.   CSW called and left message with Wendi Maya, [place] Director [phone]. Left message requesting return call.   1450: CSW received call back from [place]. She explained the Health Department does not provide any services for bed bugs as it is not considered a public health  pest. She explained that she is not aware of any resources to treat for bed bugs pro-bono due to how costly the service is.   Expected Discharge Plan: [place] Barriers to Discharge: Transportation  Expected Discharge Plan and Services Expected Discharge Plan: [place]       Living arrangements for the past 2 months: Single Family Home                                       Social Determinants of Health (SDOH) Interventions    Readmission Risk Interventions     No data to display

## 2022-02-06 NOTE — Procedures (Signed)
Patient was seen on dialysis and the procedure was supervised.  BFR 400  Via AVF BP is  162/76.   Patient appears to be tolerating treatment well  Louis Meckel 02/06/2022

## 2022-02-07 DIAGNOSIS — D649 Anemia, unspecified: Secondary | ICD-10-CM | POA: Diagnosis not present

## 2022-02-07 LAB — GLUCOSE, CAPILLARY
Glucose-Capillary: 137 mg/dL — ABNORMAL HIGH (ref 70–99)
Glucose-Capillary: 172 mg/dL — ABNORMAL HIGH (ref 70–99)
Glucose-Capillary: 192 mg/dL — ABNORMAL HIGH (ref 70–99)
Glucose-Capillary: 192 mg/dL — ABNORMAL HIGH (ref 70–99)

## 2022-02-07 MED ORDER — CHLORHEXIDINE GLUCONATE CLOTH 2 % EX PADS
6.0000 | MEDICATED_PAD | Freq: Every day | CUTANEOUS | Status: DC
  Administered 2022-02-08: 6 via TOPICAL

## 2022-02-07 NOTE — Care Management Important Message (Signed)
Important Message  Patient Details  Name: Ann [name] MRN: [id number] Date of Birth: 1950-07-27   Medicare Important Message Given:  Yes     Shelda Altes 02/07/2022, 8:31 AM

## 2022-02-07 NOTE — Progress Notes (Signed)
Occupational Therapy Treatment Patient Details Name: Ann [name] MRN: [id number] DOB: 05-02-1950 Today's Date: 02/07/2022   History of present illness Ann [name] is a pleasant 71 y.o. female who presented due to fatigue and missed 2 weeks of HD due to inability to utilize transport due to bed bugs. Pt required found to be anemic and required 2 units of PRBC.   PMH: CAD with PCI to RCA in 2010, DVT, hypertension, type 2 diabetes mellitus, OSA not on CPAP, chronic back pain, and ESRD   OT comments  Pt progressing nicely toward OT goals this session with completion of full ADL routine at (S) level with the RW. She required min cueing for safety/RW management. She did demonstrate deficits in functional activity tolerance and required frequent rest breaks from standing. Recommend HHOT at d/c. Continue OT POC.    Recommendations for follow up therapy are one component of a multi-disciplinary discharge planning process, led by the attending physician.  Recommendations may be updated based on patient status, additional functional criteria and insurance authorization.    Follow Up Recommendations  Home health OT    Assistance Recommended at Discharge Intermittent Supervision/Assistance  Patient can return home with the following  A little help with walking and/or transfers;A little help with bathing/dressing/bathroom;Assistance with cooking/housework;Assist for transportation   Equipment Recommendations  None recommended by OT    Recommendations for Other Services      Precautions / Restrictions Precautions Precautions: Fall Restrictions Weight Bearing Restrictions: No       Mobility Bed Mobility Overal bed mobility: Modified Independent                  Transfers Overall transfer level: Needs assistance Equipment used: Rolling walker (2 wheels) Transfers: Sit to/from Stand Sit to Stand: Supervision           General transfer comment: (S) with RW for     Balance                                            ADL either performed or assessed with clinical judgement   ADL Overall ADL's : Needs assistance/impaired     Grooming: Wash/dry hands;Wash/dry face;Sitting;Set up   Upper Body Bathing: Set up;Sitting   Lower Body Bathing: Supervison/ safety;Sit to/from stand   Upper Body Dressing : Set up;Sitting   Lower Body Dressing: Sit to/from stand;Supervision/safety Lower Body Dressing Details (indicate cue type and reason): Donned depends from toilet Toilet Transfer: Supervision/safety;Ambulation;Rolling walker (2 wheels)   Toileting- Clothing Manipulation and Hygiene: Supervision/safety;Sit to/from stand       Functional mobility during ADLs: Supervision/safety;Rolling walker (2 wheels) General ADL Comments: Supervision overall with ADLs sit <> stand    Extremity/Trunk Assessment Upper Extremity Assessment Upper Extremity Assessment: Generalized weakness            Vision       Perception     Praxis      Cognition Arousal/Alertness: Awake/alert Behavior During Therapy: WFL for tasks assessed/performed Overall Cognitive Status: Within Functional Limits for tasks assessed                                                General Comments      Pertinent Vitals/ Pain  Pain Assessment Pain Assessment: No/denies pain         Frequency  Min 2X/week        Progress Toward Goals  OT Goals(current goals can now be found in the care plan section)  Progress towards OT goals: Progressing toward goals     Plan Discharge plan remains appropriate       AM-PAC OT "6 Clicks" Daily Activity     Outcome Measure   Help from another person eating meals?: None Help from another person taking care of personal grooming?: None Help from another person toileting, which includes using toliet, bedpan, or urinal?: A Little Help from another person bathing (including washing, rinsing, drying)?: A  Little Help from another person to put on and taking off regular upper body clothing?: None Help from another person to put on and taking off regular lower body clothing?: A Little 6 Click Score: 21    End of Session Equipment Utilized During Treatment: Rolling walker (2 wheels);Gait belt  OT Visit Diagnosis: Unsteadiness on feet (R26.81);Other abnormalities of gait and mobility (R26.89);Repeated falls (R29.6);Muscle weakness (generalized) (M62.81)   Activity Tolerance Patient tolerated treatment well   Patient Left in chair;with call bell/phone within reach;with chair alarm set   Nurse Communication Mobility status        Time: 6440-3474 OT Time Calculation (min): 39 min  Charges: OT General Charges $OT Visit: 1 Visit OT Treatments $Self Care/Home Management : 38-52 mins  Laverle Hobby, OTR/L, [place] [facility] Office: [place] 02/07/2022, 3:23 PM

## 2022-02-07 NOTE — Progress Notes (Signed)
Physical Therapy Treatment Patient Details Name: Ann [name] MRN: [id number] DOB: [birth year] Today's Date: 02/07/2022   History of Present Illness Abbygael Curtiss Fly is a pleasant 71 y.o. female who presented due to fatigue and missed 2 weeks of HD due to inability to utilize transport due to bed bugs. Pt required found to be anemic and required 2 units of PRBC.   PMH: CAD with PCI to RCA in 2010, DVT, hypertension, type 2 diabetes mellitus, OSA not on CPAP, chronic back pain, and ESRD    PT Comments    Pt received in recliner, reports that she is having back pain since sponge bathing at sink earlier in day. Pt reports that when her back hurts at home she lies on her stomach on her bed. Worked on supine pelvic tilts to help manage pain. Pt ambulated 20' 2x with rollator. Pt has rollator at home that she uses in her house. Recommend HHPT at d/c. PT will continue to follow.    Recommendations for follow up therapy are one component of a multi-disciplinary discharge planning process, led by the attending physician.  Recommendations may be updated based on patient status, additional functional criteria and insurance authorization.  Follow Up Recommendations  Home health PT     Assistance Recommended at Discharge Intermittent Supervision/Assistance  Patient can return home with the following Assistance with cooking/housework;Direct supervision/assist for financial management;Assist for transportation;Help with stairs or ramp for entrance;Direct supervision/assist for medications management   Equipment Recommendations  Rollator (4 wheels) (pt reports breaks not functioning on her rollator)    Recommendations for Other Services       Precautions / Restrictions Precautions Precautions: Fall Restrictions Weight Bearing Restrictions: No     Mobility  Bed Mobility Overal bed mobility: Needs Assistance Bed Mobility: Sit to Supine       Sit to supine: Supervision   General bed mobility  comments: pt needed increased time and vc's for positioning in bed for comfort for her back    Transfers Overall transfer level: Needs assistance Equipment used: Rollator (4 wheels) Transfers: Sit to/from Stand Sit to Stand: Supervision           General transfer comment: close supervision as pt moving very cautiously with back pain    Ambulation/Gait Ambulation/Gait assistance: Supervision Gait Distance (Feet): 20 Feet (2x) Assistive device: Rollator (4 wheels) Gait Pattern/deviations: Step-through pattern, Decreased stride length, Trunk flexed Gait velocity: decreased Gait velocity interpretation: <1.31 ft/sec, indicative of household ambulator   General Gait Details: pt ambulated with rollator to be able to sit when back pain increased which was at 20'. Pt reports her back pain limits her significantly at home   Stairs             Wheelchair Mobility    Modified Rankin (Stroke Patients Only)       Balance Overall balance assessment: Needs assistance Sitting-balance support: Feet supported Sitting balance-Leahy Scale: Good     Standing balance support: Bilateral upper extremity supported, During functional activity Standing balance-Leahy Scale: Fair Standing balance comment: can static stand and reach outside BOS                            Cognition Arousal/Alertness: Awake/alert Behavior During Therapy: WFL for tasks assessed/performed Overall Cognitive Status: Within Functional Limits for tasks assessed  Exercises Other Exercises Other Exercises: pelvic tilts x5 in supine    General Comments        Pertinent Vitals/Pain Pain Assessment Pain Assessment: Faces Faces Pain Scale: Hurts whole lot Pain Location: low back and buttocks Pain Descriptors / Indicators: Constant, Aching, Sore Pain Intervention(s): Limited activity within patient's tolerance, Monitored during session,  Patient requesting pain meds-RN notified, Repositioned    Home Living                          Prior Function            PT Goals (current goals can now be found in the care plan section) Acute Rehab PT Goals Patient Stated Goal: return home PT Goal Formulation: With patient Time For Goal Achievement: 02/18/22 Potential to Achieve Goals: Good    Frequency    Min 3X/week      PT Plan      Co-evaluation              AM-PAC PT "6 Clicks" Mobility   Outcome Measure  Help needed turning from your back to your side while in a flat bed without using bedrails?: None Help needed moving from lying on your back to sitting on the side of a flat bed without using bedrails?: A Little Help needed moving to and from a bed to a chair (including a wheelchair)?: A Little Help needed standing up from a chair using your arms (e.g., wheelchair or bedside chair)?: A Little Help needed to walk in hospital room?: A Little Help needed climbing 3-5 steps with a railing? : A Lot 6 Click Score: 18    End of Session Equipment Utilized During Treatment: Gait belt Activity Tolerance: Patient limited by pain Patient left: with call bell/phone within reach;in bed;with bed alarm set Nurse Communication: Mobility status;Patient requests pain meds PT Visit Diagnosis: Unsteadiness on feet (R26.81);Other abnormalities of gait and mobility (R26.89);Muscle weakness (generalized) (M62.81)     Time: 7026-3785 PT Time Calculation (min) (ACUTE ONLY): 25 min  Charges:  $Gait Training: 23-37 mins                     [place] chat preferred Office [place] 02/07/2022, 4:19 PM

## 2022-02-07 NOTE — Progress Notes (Signed)
PROGRESS NOTE    Ann [name]  AOZ:[id number] DOB: December 22, 1950 DOA: 02/01/2022 PCP: Antony Blackbird, MD  71/F with history of ESRD, type 2 diabetes mellitus, CAD, hypertension, chronic anemia, history of DVT on Eliquis, diastolic CHF presented to the ED with fatigue and weakness.  Missed 2 weeks of dialysis on account of bedbugs, transportation service would not take her. -In the ED hemoglobin was 7.6, potassium was normal, still makes urine -Transfused 2 units of PRBC -Medically stable after resuming HD, awaiting safe dispo, has no transportation back and forth during hemodialysis, TOC following  Subjective: -Feels okay, no events overnight, no resolution yet to bedbug problems  Assessment and Plan:  Normocytic anemia -Unfortunately anemia panel obtained after transfusion, iron stores okay -Likely primarily secondary to CKD and hemodilution -Transfused 2 units of PRBC, no overt bleeding -Continue EPO with HD, hemoglobin improved and stable -Ambulate, PT OT eval> Home health PT recommended -Discharge planning, social work consulted to assist with HD transportation, patient reports not having any way to go back and forth to HD without transportation, ToC following  ESRD on hemodialysis -Missed 2 weeks of hemodialysis on account of loss of transportation due to bedbug infestation -Social work following -Nephrology following,  -Still makes fair amount of urine -Patient tells me she has no friends or family that can transport her to hemodialysis, TOC following to assist with HD transportation, bedbug extermination  History of DVT -Continue Eliquis  Acute on chronic diastolic CHF -Volume managed with HD  CAD -Stable continue metoprolol and Pravachol  Hypertension -Continue Norvasc, hydralazine, Imdur, Lopressor  DVT prophylaxis: Eliquis Code Status: Full code Family Communication: Discussed patient detail, no family at bedside Disposition Plan: Home when HD transportation  arranged  Consultants: Nephrology   Procedures:   Antimicrobials:    Objective: Vitals:   02/07/22 0833 02/07/22 0835 02/07/22 0900 02/07/22 1000  BP: (!) 185/67 (!) 157/66    Pulse: 87 86 84 85  Resp: 18     Temp: 98.2 F (36.8 C)     TempSrc: Oral     SpO2: 100% 100% 100% 99%  Weight:      Height:        Intake/Output Summary (Last 24 hours) at 02/07/2022 1119 Last data filed at 02/07/2022 0835 Gross per 24 hour  Intake 120 ml  Output 500 ml  Net -380 ml   Filed Weights   02/06/22 0728 02/06/22 1108 02/07/22 0647  Weight: 85.3 kg 83.6 kg 84.1 kg    Examination:  General exam:  Awake, Alert, Oriented X 3, sitting up in bed, no distress HEENT: no JVD Lungs: Good air movement bilaterally, CTAB CVS: S1S2/RRR Abd: soft, Non tender, non distended, BS present Extremities: No edema, right arm AV fistula Psychiatry:  Mood & affect appropriate.     Data Reviewed:   CBC: Recent Labs  Lab 02/01/22 2120 02/02/22 1056 02/03/22 0919 02/04/22 0041 02/05/22 0108 02/06/22 0032  WBC 4.6 5.0 4.6 5.4 5.1 5.2  NEUTROABS 2.7  --   --   --   --   --   HGB 7.6* 10.5* 10.2* 11.1* 11.3* 10.9*  HCT 23.3* 31.8* 31.3* 32.4* 32.6* 33.5*  MCV 95.1 92.2 92.9 90.5 89.8 92.5  PLT 169 92* 96* 93* 105* 962*   Basic Metabolic Panel: Recent Labs  Lab 02/01/22 2120 02/02/22 1056 02/03/22 0919 02/04/22 0041 02/06/22 0032  NA 143 141 137 133* 132*  K 4.6 4.5 4.4 4.8 5.3*  CL 114* 116* 110 99 100  CO2 22 19* 20* 25 25  GLUCOSE 124* 190* 106* 134* 159*  BUN 45* 44* 44* 28* 47*  CREATININE 4.67* 4.53* 4.36* 3.54* 4.93*  CALCIUM 8.8* 8.4* 8.3* 8.3* 9.0  MG  --  1.7  --   --   --   PHOS  --  4.1  --   --   --    GFR: Estimated Creatinine Clearance: 10.4 mL/min (A) (by C-G formula based on SCr of 4.93 mg/dL (H)). Liver Function Tests: Recent Labs  Lab 02/02/22 1056 02/04/22 0041  AST 18 19  ALT 11 12  ALKPHOS 60 53  BILITOT 0.5 0.5  PROT 5.8* 5.7*  ALBUMIN 3.0* 3.0*    No results for input(s): "LIPASE", "AMYLASE" in the last 168 hours. No results for input(s): "AMMONIA" in the last 168 hours. Coagulation Profile: No results for input(s): "INR", "PROTIME" in the last 168 hours. Cardiac Enzymes: No results for input(s): "CKTOTAL", "CKMB", "CKMBINDEX", "TROPONINI" in the last 168 hours. BNP (last 3 results) No results for input(s): "PROBNP" in the last 8760 hours. HbA1C: No results for input(s): "HGBA1C" in the last 72 hours. CBG: Recent Labs  Lab 02/06/22 0541 02/06/22 1133 02/06/22 1615 02/06/22 2043 02/07/22 0647  GLUCAP 141* 118* 207* 146* 137*   Lipid Profile: No results for input(s): "CHOL", "HDL", "LDLCALC", "TRIG", "CHOLHDL", "LDLDIRECT" in the last 72 hours. Thyroid Function Tests: No results for input(s): "TSH", "T4TOTAL", "FREET4", "T3FREE", "THYROIDAB" in the last 72 hours. Anemia Panel: No results for input(s): "VITAMINB12", "FOLATE", "FERRITIN", "TIBC", "IRON", "RETICCTPCT" in the last 72 hours.  Urine analysis:    Component Value Date/Time   COLORURINE YELLOW 11/17/2020 2351   APPEARANCEUR CLOUDY (A) 11/17/2020 2351   LABSPEC 1.011 11/17/2020 2351   PHURINE 5.0 11/17/2020 2351   GLUCOSEU NEGATIVE 11/17/2020 2351   HGBUR NEGATIVE 11/17/2020 2351   BILIRUBINUR NEGATIVE 11/17/2020 2351   KETONESUR NEGATIVE 11/17/2020 2351   PROTEINUR 100 (A) 11/17/2020 2351   UROBILINOGEN 0.2 11/21/2011 1213   NITRITE NEGATIVE 11/17/2020 2351   LEUKOCYTESUR LARGE (A) 11/17/2020 2351   Sepsis Labs: '@LABRCNTIP'$ (procalcitonin:4,lacticidven:4)  ) Recent Results (from the past 240 hour(s))  Resp Panel by RT-PCR (Flu A&B, Covid) Anterior Nasal Swab     Status: None   Collection Time: 02/02/22  6:30 AM   Specimen: Anterior Nasal Swab  Result Value Ref Range Status   SARS Coronavirus 2 by RT PCR NEGATIVE NEGATIVE Final    Comment: (NOTE) SARS-CoV-2 target nucleic acids are NOT DETECTED.  The SARS-CoV-2 RNA is generally detectable in upper  respiratory specimens during the acute phase of infection. The lowest concentration of SARS-CoV-2 viral copies this assay can detect is 138 copies/mL. A negative result does not preclude SARS-Cov-2 infection and should not be used as the sole basis for treatment or other patient management decisions. A negative result may occur with  improper specimen collection/handling, submission of specimen other than nasopharyngeal swab, presence of viral mutation(s) within the areas targeted by this assay, and inadequate number of viral copies(<138 copies/mL). A negative result must be combined with clinical observations, patient history, and epidemiological information. The expected result is Negative.  Fact Sheet for Patients:  EntrepreneurPulse.com.au  Fact Sheet for Healthcare Providers:  IncredibleEmployment.be  This test is no t yet approved or cleared by the [place] FDA and  has been authorized for detection and/or diagnosis of SARS-CoV-2 by FDA under an Emergency Use Authorization (EUA). This EUA will remain  in effect (meaning this test can be used)  for the duration of the COVID-19 declaration under Section 564(b)(1) of the Act, 21 U.S.C.section 360bbb-3(b)(1), unless the authorization is terminated  or revoked sooner.       Influenza A by PCR NEGATIVE NEGATIVE Final   Influenza B by PCR NEGATIVE NEGATIVE Final    Comment: (NOTE) The Xpert Xpress SARS-CoV-2/FLU/RSV plus assay is intended as an aid in the diagnosis of influenza from Nasopharyngeal swab specimens and should not be used as a sole basis for treatment. Nasal washings and aspirates are unacceptable for Xpert Xpress SARS-CoV-2/FLU/RSV testing.  Fact Sheet for Patients: EntrepreneurPulse.com.au  Fact Sheet for Healthcare Providers: IncredibleEmployment.be  This test is not yet approved or cleared by the [place] FDA and has been  authorized for detection and/or diagnosis of SARS-CoV-2 by FDA under an Emergency Use Authorization (EUA). This EUA will remain in effect (meaning this test can be used) for the duration of the COVID-19 declaration under Section 564(b)(1) of the Act, 21 U.S.C. section 360bbb-3(b)(1), unless the authorization is terminated or revoked.  Performed at [facility], [place] [address], [place], [place] [address]   MRSA Next Gen by PCR, Nasal     Status: None   Collection Time: 02/05/22  3:51 AM   Specimen: Nasal Mucosa; Nasal Swab  Result Value Ref Range Status   MRSA by PCR Next Gen NOT DETECTED NOT DETECTED Final    Comment: (NOTE) The GeneXpert MRSA Assay (FDA approved for NASAL specimens only), is one component of a comprehensive MRSA colonization surveillance program. It is not intended to diagnose MRSA infection nor to guide or monitor treatment for MRSA infections. Test performance is not FDA approved in patients less than 32 years old. Performed at [facility], [place] [address] [place], [place] [address]      Radiology Studies: No results found.   Scheduled Meds:  amLODipine  10 mg Oral q AM   apixaban  5 mg Oral BID   Chlorhexidine Gluconate Cloth  6 each Topical Q0600   darbepoetin (ARANESP) injection - DIALYSIS  60 mcg Intravenous Q Fri-HD   doxercalciferol  2 mcg Intravenous Q Fri-HD   DULoxetine  60 mg Oral q AM   feeding supplement  237 mL Oral BID BM   hydrALAZINE  50 mg Oral TID   hydrocerin   Topical BID   insulin aspart  0-5 Units Subcutaneous QHS   insulin aspart  0-6 Units Subcutaneous TID WC   isosorbide mononitrate  30 mg Oral q AM   lidocaine  1 patch Transdermal Q24H   metoprolol tartrate  25 mg Oral Daily   multivitamin  1 tablet Oral QHS   pantoprazole  20 mg Oral q AM   pravastatin  40 mg Oral q AM   rOPINIRole  0.25 mg Oral QHS   sodium chloride flush  3 mL Intravenous Q12H   Continuous Infusions:  sodium chloride       LOS: 6 days     Time spent: 43mn    PDomenic Polite MD [facility]   02/07/2022, 11:19 AM

## 2022-02-07 NOTE — Progress Notes (Addendum)
[facility] Progress Note  Subjective: seen in room-  she feels well-  says her son is in the pest control business   Vitals:   02/06/22 2043 02/07/22 0647 02/07/22 0833 02/07/22 0835  BP: (!) 156/63 (!) 154/88 (!) 185/67 (!) 157/66  Pulse: 88 86 87 86  Resp: '18 18 18   '$ Temp: 98.2 F (36.8 C) 98.6 F (37 C) 98.2 F (36.8 C)   TempSrc: Oral Oral Oral   SpO2: 94% 98% 100% 100%  Weight:  84.1 kg    Height:        Exam: Gen alert, no distress No jvd or bruits Chest clear bilat to bases RRR no MRG Abd soft ntnd no mass or ascites +bs Ext no LE edema Neuro is alert, Ox 3 , nf    RUA AVF+bruit    Home meds include - albuterol, amlodipine 10, apixaban, duloxetine, hydralazine 50 tid, norco prn, isosorbide mononitrate 30, linagliptin, metoprolol 25 qd, pantoprazole, pravastatin, ropinirole, prns/ supps/ vits    OP HD: MWF [facility] 4h  400/ 500  78.6kg  3K/2.5 bath  Hep 2000  RUA AVF - last HD 9/27, post wt 79kg - last Hb 9.7 on 9/27 - mircera 30 q2, last 9/27 - venofer '100mg'$  weekly, last 9/27 - doxercalciferol 2 ug IV once per wk, last 9/27   Assessment/ Plan: Anemia symptomatic - Hb 7's, likely due to CKD. S/p 2 units prbcs, and got darbe 60 ug w/ HD 10/13 here. Hb 10- 11 now ESRD - HD MWF.  Next HD 10/18 on schedule via AVF  Need for continued dialysis - has been on x 1 year, she states she voids "a lot, all the time" and creat here was 4 on admission despite having missed HD 2 wks of HD. Question if patient may be able to come off of dialysis?  On a prior admit had the same issue. Standing wt here is under dry wt (recorded bed wts are inaccurate). Will get 24 hr creat clearance over the weekend.   Crt clearance only came out at 5 and with albumin of 3.0 dont think she could come off of HD HD transportation - have consulted Care Management to look into her bedbug situation. (bedbugs causing transportation company to refuse taking her to/ from outpt HD).  She says her  brother is in the pest control business and granddaughter stays there-  I have urged her to come up with some kind of plan for treatment of bed bugs as that would help a lot  HTN - cont meds, no gross vol overload on exam but with BP high could challenge EDW to improve  H/o DVT - takes eliquis Anemia esrd - last esa was on 9/27 > we started darbe here at higher dose 60 ug weekly. Has been stable since transfusion  MBD ckd - Ca in range,  phos also good. Cont IV vdra weekly.  Dispo-  could be discharged from our standpoint    Louis Meckel  02/07/2022, 9:51 AM   Recent Labs  Lab 02/02/22 1056 02/03/22 0919 02/04/22 0041 02/05/22 0108 02/06/22 0032  HGB 10.5*   < > 11.1* 11.3* 10.9*  ALBUMIN 3.0*  --  3.0*  --   --   CALCIUM 8.4*   < > 8.3*  --  9.0  PHOS 4.1  --   --   --   --   CREATININE 4.53*   < > 3.54*  --  4.93*  K  4.5   < > 4.8  --  5.3*   < > = values in this interval not displayed.   Recent Labs  Lab 02/02/22 1056  IRON 75  TIBC 171*  FERRITIN 785*   Inpatient medications:  amLODipine  10 mg Oral q AM   apixaban  5 mg Oral BID   Chlorhexidine Gluconate Cloth  6 each Topical Q0600   darbepoetin (ARANESP) injection - DIALYSIS  60 mcg Intravenous Q Fri-HD   doxercalciferol  2 mcg Intravenous Q Fri-HD   DULoxetine  60 mg Oral q AM   feeding supplement  237 mL Oral BID BM   hydrALAZINE  50 mg Oral TID   hydrocerin   Topical BID   insulin aspart  0-5 Units Subcutaneous QHS   insulin aspart  0-6 Units Subcutaneous TID WC   isosorbide mononitrate  30 mg Oral q AM   lidocaine  1 patch Transdermal Q24H   metoprolol tartrate  25 mg Oral Daily   multivitamin  1 tablet Oral QHS   pantoprazole  20 mg Oral q AM   pravastatin  40 mg Oral q AM   rOPINIRole  0.25 mg Oral QHS   sodium chloride flush  3 mL Intravenous Q12H    sodium chloride     sodium chloride, acetaminophen **OR** acetaminophen, HYDROcodone-acetaminophen, hydrOXYzine, mouth rinse, sodium  chloride flush

## 2022-02-08 DIAGNOSIS — D649 Anemia, unspecified: Secondary | ICD-10-CM | POA: Diagnosis not present

## 2022-02-08 LAB — GLUCOSE, CAPILLARY
Glucose-Capillary: 120 mg/dL — ABNORMAL HIGH (ref 70–99)
Glucose-Capillary: 151 mg/dL — ABNORMAL HIGH (ref 70–99)
Glucose-Capillary: 156 mg/dL — ABNORMAL HIGH (ref 70–99)

## 2022-02-08 LAB — CBC
HCT: 35.2 % — ABNORMAL LOW (ref 36.0–46.0)
Hemoglobin: 11.3 g/dL — ABNORMAL LOW (ref 12.0–15.0)
MCH: 30.5 pg (ref 26.0–34.0)
MCHC: 32.1 g/dL (ref 30.0–36.0)
MCV: 95.1 fL (ref 80.0–100.0)
Platelets: 162 10*3/uL (ref 150–400)
RBC: 3.7 MIL/uL — ABNORMAL LOW (ref 3.87–5.11)
RDW: 14.9 % (ref 11.5–15.5)
WBC: 5.6 10*3/uL (ref 4.0–10.5)
nRBC: 0 % (ref 0.0–0.2)

## 2022-02-08 NOTE — Progress Notes (Signed)
PROGRESS NOTE    Ann [name]  DPO:[id number] DOB: 1950-11-20 DOA: 02/01/2022 PCP: Antony Blackbird, MD  71/F with history of ESRD, type 2 diabetes mellitus, CAD, hypertension, chronic anemia, history of DVT on Eliquis, diastolic CHF presented to the ED with fatigue and weakness.  Missed 2 weeks of dialysis on account of bedbugs, transportation service would not take her until bedbugs were exterminated. -In the ED hemoglobin was 7.6, potassium was normal, still makes urine -Transfused 2 units of PRBC -Medically stable after resuming HD, awaiting safe dispo, has no transportation back and forth during hemodialysis, TOC following  Subjective: -Feels well, chronic backache, no resolution yet to bedbug issue  Assessment and Plan:  Normocytic anemia -Unfortunately anemia panel obtained after transfusion, iron stores okay -Likely primarily secondary to CKD and hemodilution -Transfused 2 units of PRBC, no overt bleeding -Continue EPO with HD, hemoglobin improved and stable -Ambulate, PT OT eval> Home health PT recommended -Discharge planning, social work consulted to assist with HD transportation, patient reports not having any way to go back and forth to HD without transportation, ToC following-social work trying to find resolution  ESRD on hemodialysis -Missed 2 weeks of hemodialysis on account of loss of transportation due to bedbug infestation -Social work following -Nephrology following,  -Still makes fair amount of urine -Patient tells me she has no friends or family that can transport her to hemodialysis, TOC following to assist with HD transportation, bedbug extermination-asked pt to ask family for help  History of DVT -Continue Eliquis  Acute on chronic diastolic CHF -Volume managed with HD  CAD -Stable continue metoprolol and Pravachol  Hypertension -Continue Norvasc, hydralazine, Imdur, Lopressor  DVT prophylaxis: Eliquis Code Status: Full code Family Communication:  Discussed patient detail, no family at bedside Disposition Plan: Home when HD transportation arranged  Consultants: Nephrology   Procedures:   Antimicrobials:    Objective: Vitals:   02/07/22 1549 02/07/22 2043 02/08/22 0451 02/08/22 0855  BP: (!) 141/59 (!) 144/60 (!) 150/65 (!) 153/69  Pulse: 81 80 74 78  Resp: '20 20 20 18  '$ Temp: 98.7 F (37.1 C) 97.9 F (36.6 C) (!) 97.5 F (36.4 C) 97.6 F (36.4 C)  TempSrc: Axillary Oral Oral Oral  SpO2: 100% 99% 100% 98%  Weight:   84.8 kg   Height:        Intake/Output Summary (Last 24 hours) at 02/08/2022 1048 Last data filed at 02/08/2022 0813 Gross per 24 hour  Intake 603 ml  Output 500 ml  Net 103 ml   Filed Weights   02/06/22 1108 02/07/22 0647 02/08/22 0451  Weight: 83.6 kg 84.1 kg 84.8 kg    Examination:  General exam: AAOx3, no distress CVS: S1-S2, regular rhythm Lungs: Clear bilaterally Abdomen: Soft, nontender, bowel sounds present Extremities: No edema, right arm AV fistula  Psychiatry:  Mood & affect appropriate.     Data Reviewed:   CBC: Recent Labs  Lab 02/01/22 2120 02/02/22 1056 02/03/22 0919 02/04/22 0041 02/05/22 0108 02/06/22 0032 02/08/22 0042  WBC 4.6   < > 4.6 5.4 5.1 5.2 5.6  NEUTROABS 2.7  --   --   --   --   --   --   HGB 7.6*   < > 10.2* 11.1* 11.3* 10.9* 11.3*  HCT 23.3*   < > 31.3* 32.4* 32.6* 33.5* 35.2*  MCV 95.1   < > 92.9 90.5 89.8 92.5 95.1  PLT 169   < > 96* 93* 105* 120* 162   < > =  values in this interval not displayed.   Basic Metabolic Panel: Recent Labs  Lab 02/01/22 2120 02/02/22 1056 02/03/22 0919 02/04/22 0041 02/06/22 0032  NA 143 141 137 133* 132*  K 4.6 4.5 4.4 4.8 5.3*  CL 114* 116* 110 99 100  CO2 22 19* 20* 25 25  GLUCOSE 124* 190* 106* 134* 159*  BUN 45* 44* 44* 28* 47*  CREATININE 4.67* 4.53* 4.36* 3.54* 4.93*  CALCIUM 8.8* 8.4* 8.3* 8.3* 9.0  MG  --  1.7  --   --   --   PHOS  --  4.1  --   --   --    GFR: Estimated Creatinine Clearance:  10.4 mL/min (A) (by C-G formula based on SCr of 4.93 mg/dL (H)). Liver Function Tests: Recent Labs  Lab 02/02/22 1056 02/04/22 0041  AST 18 19  ALT 11 12  ALKPHOS 60 53  BILITOT 0.5 0.5  PROT 5.8* 5.7*  ALBUMIN 3.0* 3.0*   No results for input(s): "LIPASE", "AMYLASE" in the last 168 hours. No results for input(s): "AMMONIA" in the last 168 hours. Coagulation Profile: No results for input(s): "INR", "PROTIME" in the last 168 hours. Cardiac Enzymes: No results for input(s): "CKTOTAL", "CKMB", "CKMBINDEX", "TROPONINI" in the last 168 hours. BNP (last 3 results) No results for input(s): "PROBNP" in the last 8760 hours. HbA1C: No results for input(s): "HGBA1C" in the last 72 hours. CBG: Recent Labs  Lab 02/07/22 0647 02/07/22 1129 02/07/22 1628 02/07/22 2049 02/08/22 0636  GLUCAP 137* 172* 192* 192* 120*   Lipid Profile: No results for input(s): "CHOL", "HDL", "LDLCALC", "TRIG", "CHOLHDL", "LDLDIRECT" in the last 72 hours. Thyroid Function Tests: No results for input(s): "TSH", "T4TOTAL", "FREET4", "T3FREE", "THYROIDAB" in the last 72 hours. Anemia Panel: No results for input(s): "VITAMINB12", "FOLATE", "FERRITIN", "TIBC", "IRON", "RETICCTPCT" in the last 72 hours.  Urine analysis:    Component Value Date/Time   COLORURINE YELLOW 11/17/2020 2351   APPEARANCEUR CLOUDY (A) 11/17/2020 2351   LABSPEC 1.011 11/17/2020 2351   PHURINE 5.0 11/17/2020 2351   GLUCOSEU NEGATIVE 11/17/2020 2351   HGBUR NEGATIVE 11/17/2020 2351   BILIRUBINUR NEGATIVE 11/17/2020 2351   KETONESUR NEGATIVE 11/17/2020 2351   PROTEINUR 100 (A) 11/17/2020 2351   UROBILINOGEN 0.2 11/21/2011 1213   NITRITE NEGATIVE 11/17/2020 2351   LEUKOCYTESUR LARGE (A) 11/17/2020 2351   Sepsis Labs: '@LABRCNTIP'$ (procalcitonin:4,lacticidven:4)  ) Recent Results (from the past 240 hour(s))  Resp Panel by RT-PCR (Flu A&B, Covid) Anterior Nasal Swab     Status: None   Collection Time: 02/02/22  6:30 AM   Specimen:  Anterior Nasal Swab  Result Value Ref Range Status   SARS Coronavirus 2 by RT PCR NEGATIVE NEGATIVE Final    Comment: (NOTE) SARS-CoV-2 target nucleic acids are NOT DETECTED.  The SARS-CoV-2 RNA is generally detectable in upper respiratory specimens during the acute phase of infection. The lowest concentration of SARS-CoV-2 viral copies this assay can detect is 138 copies/mL. A negative result does not preclude SARS-Cov-2 infection and should not be used as the sole basis for treatment or other patient management decisions. A negative result may occur with  improper specimen collection/handling, submission of specimen other than nasopharyngeal swab, presence of viral mutation(s) within the areas targeted by this assay, and inadequate number of viral copies(<138 copies/mL). A negative result must be combined with clinical observations, patient history, and epidemiological information. The expected result is Negative.  Fact Sheet for Patients:  EntrepreneurPulse.com.au  Fact Sheet for Healthcare Providers:  IncredibleEmployment.be  This test is no t yet approved or cleared by the [place] and  has been authorized for detection and/or diagnosis of SARS-CoV-2 by FDA under an Emergency Use Authorization (EUA). This EUA will remain  in effect (meaning this test can be used) for the duration of the COVID-19 declaration under Section 564(b)(1) of the Act, 21 U.S.C.section 360bbb-3(b)(1), unless the authorization is terminated  or revoked sooner.       Influenza A by PCR NEGATIVE NEGATIVE Final   Influenza B by PCR NEGATIVE NEGATIVE Final    Comment: (NOTE) The Xpert Xpress SARS-CoV-2/FLU/RSV plus assay is intended as an aid in the diagnosis of influenza from Nasopharyngeal swab specimens and should not be used as a sole basis for treatment. Nasal washings and aspirates are unacceptable for Xpert Xpress SARS-CoV-2/FLU/RSV testing.  Fact  Sheet for Patients: EntrepreneurPulse.com.au  Fact Sheet for Healthcare Providers: IncredibleEmployment.be  This test is not yet approved or cleared by the [place] FDA and has been authorized for detection and/or diagnosis of SARS-CoV-2 by FDA under an Emergency Use Authorization (EUA). This EUA will remain in effect (meaning this test can be used) for the duration of the COVID-19 declaration under Section 564(b)(1) of the Act, 21 U.S.C. section 360bbb-3(b)(1), unless the authorization is terminated or revoked.  Performed at [facility], [place] [address] [place], [place] [address]   MRSA Next Gen by PCR, Nasal     Status: None   Collection Time: 02/05/22  3:51 AM   Specimen: Nasal Mucosa; Nasal Swab  Result Value Ref Range Status   MRSA by PCR Next Gen NOT DETECTED NOT DETECTED Final    Comment: (NOTE) The GeneXpert MRSA Assay (FDA approved for NASAL specimens only), is one component of a comprehensive MRSA colonization surveillance program. It is not intended to diagnose MRSA infection nor to guide or monitor treatment for MRSA infections. Test performance is not FDA approved in patients less than 72 years old. Performed at [facility], [place] [address] [place], [place] [address]      Radiology Studies: No results found.   Scheduled Meds:  amLODipine  10 mg Oral q AM   apixaban  5 mg Oral BID   Chlorhexidine Gluconate Cloth  6 each Topical Q0600   Chlorhexidine Gluconate Cloth  6 each Topical Q0600   darbepoetin (ARANESP) injection - DIALYSIS  60 mcg Intravenous Q Fri-HD   doxercalciferol  2 mcg Intravenous Q Fri-HD   DULoxetine  60 mg Oral q AM   feeding supplement  237 mL Oral BID BM   hydrALAZINE  50 mg Oral TID   hydrocerin   Topical BID   insulin aspart  0-5 Units Subcutaneous QHS   insulin aspart  0-6 Units Subcutaneous TID WC   isosorbide mononitrate  30 mg Oral q AM   lidocaine  1 patch Transdermal Q24H    metoprolol tartrate  25 mg Oral Daily   multivitamin  1 tablet Oral QHS   pantoprazole  20 mg Oral q AM   pravastatin  40 mg Oral q AM   rOPINIRole  0.25 mg Oral QHS   sodium chloride flush  3 mL Intravenous Q12H   Continuous Infusions:  sodium chloride       LOS: 7 days    Time spent: 85mn    PDomenic Polite MD [facility]   02/08/2022, 10:48 AM

## 2022-02-08 NOTE — Progress Notes (Signed)
[facility] Progress Note  Subjective: seen in room-  she feels well-  due for dialysis today   Vitals:   02/07/22 1549 02/07/22 2043 02/08/22 0451 02/08/22 0855  BP: (!) 141/59 (!) 144/60 (!) 150/65 (!) 153/69  Pulse: 81 80 74 78  Resp: '20 20 20 18  '$ Temp: 98.7 F (37.1 C) 97.9 F (36.6 C) (!) 97.5 F (36.4 C) 97.6 F (36.4 C)  TempSrc: Axillary Oral Oral Oral  SpO2: 100% 99% 100% 98%  Weight:   84.8 kg   Height:        Exam: Gen alert, no distress No jvd or bruits Chest clear bilat to bases RRR no MRG Abd soft ntnd no mass or ascites +bs Ext no LE edema Neuro is alert, Ox 3 , nf    RUA AVF+bruit    Home meds include - albuterol, amlodipine 10, apixaban, duloxetine, hydralazine 50 tid, norco prn, isosorbide mononitrate 30, linagliptin, metoprolol 25 qd, pantoprazole, pravastatin, ropinirole, prns/ supps/ vits    OP HD: MWF [facility] 4h  400/ 500  78.6kg  3K/2.5 bath  Hep 2000  RUA AVF - last HD 9/27, post wt 79kg - last Hb 9.7 on 9/27 - mircera 30 q2, last 9/27 - venofer '100mg'$  weekly, last 9/27 - doxercalciferol 2 ug IV once per wk, last 9/27   Assessment/ Plan: Anemia symptomatic - Hb 7's, likely due to CKD. S/p 2 units prbcs, and got darbe 60 ug w/ HD 10/13 here. Hb 10- 11 now ESRD - HD MWF.  Next HD 10/18 on schedule via AVF  Need for continued dialysis - has been on x 1 year, she states she voids "a lot, all the time" and creat here was 4 on admission despite having missed HD 2 wks of HD. Question if patient may be able to come off of dialysis?   24 hr creat clearance only came out at 5 and with albumin of 3.0 dont think she could come off of HD HD transportation - have consulted Care Management to look into her bedbug situation. (bedbugs causing transportation company to refuse taking her to/ from outpt HD).  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 Has been stable since transfusion  MBD ckd - Ca in range,  phos also good. Cont IV vdra weekly.  Dispo-  could be discharged from our standpoint    Louis Meckel  02/08/2022, 8:57 AM   Recent Labs  Lab 02/02/22 1056 02/03/22 0919 02/04/22 0041 02/05/22 0108 02/06/22 0032 02/08/22 0042  HGB 10.5*   < > 11.1*   < > 10.9* 11.3*  ALBUMIN 3.0*  --  3.0*  --   --   --   CALCIUM 8.4*   < > 8.3*  --  9.0  --   PHOS 4.1  --   --   --   --   --   CREATININE 4.53*   < > 3.54*  --  4.93*  --   K 4.5   < > 4.8  --  5.3*  --    < > = values in this interval not displayed.   Recent Labs  Lab 02/02/22 1056  IRON 75  TIBC 171*  FERRITIN 785*   Inpatient medications:  amLODipine  10 mg Oral q AM   apixaban  5 mg Oral BID   Chlorhexidine Gluconate Cloth  6 each Topical Q0600   Chlorhexidine Gluconate Cloth  6 each Topical Q0600   darbepoetin (ARANESP) injection - DIALYSIS  60 mcg Intravenous Q Fri-HD   doxercalciferol  2 mcg Intravenous Q Fri-HD   DULoxetine  60 mg Oral q AM   feeding supplement  237 mL Oral BID BM   hydrALAZINE  50 mg Oral TID   hydrocerin   Topical BID   insulin aspart  0-5 Units Subcutaneous QHS   insulin aspart  0-6 Units Subcutaneous TID WC   isosorbide mononitrate  30 mg Oral q AM   lidocaine  1 patch Transdermal Q24H   metoprolol tartrate  25 mg Oral Daily   multivitamin  1 tablet Oral QHS   pantoprazole  20 mg Oral q AM   pravastatin  40 mg Oral q AM   rOPINIRole  0.25 mg Oral QHS   sodium chloride flush  3 mL Intravenous Q12H    sodium chloride     sodium chloride, acetaminophen **OR** acetaminophen, HYDROcodone-acetaminophen, hydrOXYzine, mouth rinse, sodium chloride flush

## 2022-02-09 DIAGNOSIS — I5033 Acute on chronic diastolic (congestive) heart failure: Secondary | ICD-10-CM | POA: Diagnosis not present

## 2022-02-09 DIAGNOSIS — E1122 Type 2 diabetes mellitus with diabetic chronic kidney disease: Secondary | ICD-10-CM | POA: Diagnosis not present

## 2022-02-09 DIAGNOSIS — N186 End stage renal disease: Secondary | ICD-10-CM | POA: Diagnosis not present

## 2022-02-09 DIAGNOSIS — D649 Anemia, unspecified: Secondary | ICD-10-CM | POA: Diagnosis not present

## 2022-02-09 LAB — GLUCOSE, CAPILLARY
Glucose-Capillary: 122 mg/dL — ABNORMAL HIGH (ref 70–99)
Glucose-Capillary: 161 mg/dL — ABNORMAL HIGH (ref 70–99)
Glucose-Capillary: 144 mg/dL — ABNORMAL HIGH (ref 70–99)
Glucose-Capillary: 127 mg/dL — ABNORMAL HIGH (ref 70–99)

## 2022-02-09 MED ORDER — CHLORHEXIDINE GLUCONATE CLOTH 2 % EX PADS
6.0000 | MEDICATED_PAD | Freq: Every day | CUTANEOUS | Status: DC
  Administered 2022-02-10 – 2022-02-11 (×2): 6 via TOPICAL

## 2022-02-09 NOTE — Progress Notes (Signed)
  Progress Note   Patient: Ann [name] JHE:[id number] DOB: [birth year] DOA: 02/01/2022     8 DOS: the patient was seen and examined on 02/09/2022   Brief hospital course: 71/F with history of ESRD, type 2 diabetes mellitus, CAD, hypertension, chronic anemia, history of DVT on Eliquis, diastolic CHF presented to the ED with fatigue and weakness.  Missed 2 weeks of dialysis on account of bedbugs, transportation service would not take her until bedbugs were exterminated. -In the ED hemoglobin was 7.6, potassium was normal, still makes urine -Transfused 2 units of PRBC -Medically stable after resuming HD, awaiting safe dispo, has no transportation back and forth during hemodialysis, TOC following    Assessment and Plan: Normocytic anemia -Likely primarily secondary to CKD and hemodilution -Transfused 2 units of PRBC earlier this visit, no overt bleeding -Continue EPO with HD, hemoglobin improved and stable -Ambulate, PT OT eval> Home health PT recommended -Discharge planning, social work consulted to assist with HD transportation, patient reports not having any way to go back and forth to HD without transportation, ToC following-social work trying to find resolution, pending   ESRD on hemodialysis -Missed 2 weeks of hemodialysis on account of loss of transportation due to bedbug infestation -Social work following -Nephrology following, still recommending HD despite voiding. See nephro note -Patient tells me she has no friends or family that can transport her to hemodialysis, TOC following to assist with HD transportation, bedbug extermination-asked pt to ask family for help   History of DVT -Continue Eliquis   Acute on chronic diastolic CHF -Volume managed with HD   CAD -Stable continue metoprolol and Pravachol   Hypertension -Continue Norvasc, hydralazine, Imdur, Lopressor      Subjective: Without complaints this AM  Physical Exam: Vitals:   02/08/22 1947 02/08/22 2126  02/09/22 0449 02/09/22 1050  BP: 105/70 (!) 153/62 (!) 141/67 128/63  Pulse: 85 79 86 93  Resp: 12   20  Temp: 98.1 F (36.7 C) 98.5 F (36.9 C) 98.1 F (36.7 C) 98.3 F (36.8 C)  TempSrc: Oral Oral Oral Oral  SpO2: 100% 100% 98% 100%  Weight:   82.5 kg   Height:       General exam: Awake, laying in bed, in nad Respiratory system: Normal respiratory effort, no wheezing Cardiovascular system: regular rate, s1, s2 Gastrointestinal system: Soft, nondistended, positive BS Central nervous system: CN2-12 grossly intact, strength intact Extremities: Perfused, no clubbing Skin: Normal skin turgor, no notable skin lesions seen Psychiatry: Mood normal // no visual hallucinations   Data Reviewed:  There are no new results to review at this time.  Family Communication: pt in room, family not at bedside  Disposition: Status is: Inpatient Remains inpatient appropriate because: Severity of illness  Planned Discharge Destination:  Unclear at this time    Author: Marylu Lund, MD 02/09/2022 5:17 PM  For on call review [email or web address].

## 2022-02-09 NOTE — Progress Notes (Signed)
**Ann [name] De-Identified via Obfuscation** Ann Ann [name]  Ann [name]: HD done yesterday -  4000 removed-  she said she did Ann [name]   Vitals:   02/08/22 1930 02/08/22 1947 02/08/22 2126 02/09/22 0449  BP: 121/67 105/70 (!) 153/62 (!) 141/67  Pulse: 84 85 79 86  Resp: 12 12    Temp:  98.1 F (36.7 C) 98.5 F (36.9 C) 98.1 F (36.7 C)  TempSrc:  Oral Oral Oral  SpO2: 98% 100% 100% 98%  Weight:    82.5 kg  Height:        Exam: Gen alert, no distress No jvd or bruits Chest clear bilat to bases RRR no MRG Abd soft ntnd no mass or ascites +bs Ext no LE edema Neuro is alert, Ox 3 , nf    RUA AVF+bruit    Home meds include - albuterol, amlodipine 10, apixaban, duloxetine, hydralazine 50 tid, norco prn, isosorbide mononitrate 30, linagliptin, metoprolol 25 qd, pantoprazole, pravastatin, ropinirole, prns/ supps/ vits    OP HD: MWF [facility] 4h  400/ 500  78.6kg  3K/2.5 bath  Hep 2000  RUA AVF - last HD 9/27, post wt 79kg - last Hb 9.7 on 9/27 - mircera 30 q2, last 9/27 - venofer '100mg'$  weekly, last 9/27 - doxercalciferol 2 ug IV once per wk, last 9/27   Assessment/ Plan: Anemia symptomatic - Hb 7's, likely due to CKD. S/p 2 units prbcs, and got darbe 60 ug w/ HD 10/13 here. Hb 10- 11 now ESRD - HD MWF.  Next HD 10/20-  Friday on schedule via AVF  Need for continued dialysis - has been on x 1 year, she states she voids "a lot, all the time" and creat here was 4 on admission despite having missed HD 2 wks of HD. Question if patient may be able to come off of dialysis?   24 hr creat clearance only came out at 5 and with albumin of 3.0 dont think she could come off of HD HD transportation - have consulted Care Management to look into her bedbug situation. (bedbugs causing transportation company to refuse taking her to/ from outpt HD).  She says her brother is in the pest control business and granddaughter stays there-  I have urged her to come up with some kind of plan for treatment of bed bugs as that would  help a lot  HTN - cont meds-   on norvasc/hydralazine and lopressor , no gross vol overload on exam but with BP high could challenge EDW to improve-  trying today  -  but according to weights here-  very much above  EDW ? Will cont to challenge as able and might be able to wean BP meds  H/o DVT - takes eliquis Anemia esrd - last esa was on 9/27 > we started darbe here at higher dose 60 ug weekly. Has been stable since transfusion  MBD ckd - Ca in range,  phos also good. Cont IV vdra weekly.  Dispo-  could be discharged from our standpoint    Louis Meckel  02/09/2022, 7:50 AM   Recent Labs  Lab 02/02/22 1056 02/03/22 0919 02/04/22 0041 02/05/22 0108 02/06/22 0032 02/08/22 0042  HGB 10.5*   < > 11.1*   < > 10.9* 11.3*  ALBUMIN 3.0*  --  3.0*  --   --   --   CALCIUM 8.4*   < > 8.3*  --  9.0  --   PHOS 4.1  --   --   --   --   --  CREATININE 4.53*   < > 3.54*  --  4.93*  --   K 4.5   < > 4.8  --  5.3*  --    < > = values in this interval not displayed.   Recent Labs  Lab 02/02/22 1056  IRON 75  TIBC 171*  FERRITIN 785*   Inpatient medications:  amLODipine  10 mg Oral q AM   apixaban  5 mg Oral BID   Chlorhexidine Gluconate Cloth  6 each Topical Q0600   Chlorhexidine Gluconate Cloth  6 each Topical Q0600   darbepoetin (ARANESP) injection - DIALYSIS  60 mcg Intravenous Q Fri-HD   doxercalciferol  2 mcg Intravenous Q Fri-HD   DULoxetine  60 mg Oral q AM   feeding supplement  237 mL Oral BID BM   hydrALAZINE  50 mg Oral TID   hydrocerin   Topical BID   insulin aspart  0-5 Units Subcutaneous QHS   insulin aspart  0-6 Units Subcutaneous TID WC   isosorbide mononitrate  30 mg Oral q AM   lidocaine  1 patch Transdermal Q24H   metoprolol tartrate  25 mg Oral Daily   multivitamin  1 tablet Oral QHS   pantoprazole  20 mg Oral q AM   pravastatin  40 mg Oral q AM   rOPINIRole  0.25 mg Oral QHS   sodium chloride flush  3 mL Intravenous Q12H    sodium chloride      sodium chloride, acetaminophen **OR** acetaminophen, HYDROcodone-acetaminophen, hydrOXYzine, mouth rinse, sodium chloride flush

## 2022-02-09 NOTE — Plan of Care (Signed)
  Problem: Health Behavior/Discharge Planning: Goal: Ability to identify and utilize available resources and services will improve Outcome: Progressing Goal: Ability to manage health-related needs will improve Outcome: Progressing   Problem: Clinical Measurements: Goal: Cardiovascular complication will be avoided Outcome: Progressing   Problem: Clinical Measurements: Goal: Will remain free from infection Outcome: Progressing   Problem: Coping: Goal: Level of anxiety will decrease Outcome: Progressing   Problem: Pain Managment: Goal: General experience of comfort will improve Outcome: Progressing   Problem: Safety: Goal: Ability to remain free from injury will improve Outcome: Progressing   Problem: Skin Integrity: Goal: Risk for impaired skin integrity will decrease Outcome: Progressing

## 2022-02-09 NOTE — Hospital Course (Signed)
71/F with history of ESRD, type 2 diabetes mellitus, CAD, hypertension, chronic anemia, history of DVT on Eliquis, diastolic CHF presented to the ED with fatigue and weakness.  Missed 2 weeks of dialysis on account of bedbugs, transportation service would not take her until bedbugs were exterminated. -In the ED hemoglobin was 7.6, potassium was normal, still makes urine -Transfused 2 units of PRBC -Medically stable after resuming HD, awaiting safe dispo, has no transportation back and forth during hemodialysis, TOC following

## 2022-02-09 NOTE — Progress Notes (Signed)
Occupational Therapy Treatment Patient Details Name: Ann [name] MRN: [id number] DOB: 08-24-1950 Today's Date: 02/09/2022   History of present illness Ann [name] is a pleasant 71 y.o. female who presented due to fatigue and missed 2 weeks of HD due to inability to utilize transport due to bed bugs. Pt required found to be anemic and required 2 units of PRBC.   PMH: CAD with PCI to RCA in 2010, DVT, hypertension, type 2 diabetes mellitus, OSA not on CPAP, chronic back pain, and ESRD   OT comments  Pt is making good progress towards goals but was more limited in session due to pain in B feet and reported it feels "like walking on glass" . Pt transferred to [facility] with min guard and RW. Pt then post set up was able to complete UE dressing and bathing and min guard to min assist for LE dressing/bathing due to level of fatigue. Pt currently with functional limitations due to the deficits listed below (see OT Problem List).  Pt will benefit from skilled OT to increase their safety and independence with ADL and functional mobility for ADL to facilitate discharge to venue listed below.     Recommendations for follow up therapy are one component of a multi-disciplinary discharge planning process, led by the attending physician.  Recommendations may be updated based on patient status, additional functional criteria and insurance authorization.    Follow Up Recommendations  Home health OT    Assistance Recommended at Discharge Intermittent Supervision/Assistance  Patient can return home with the following  A little help with walking and/or transfers;A little help with bathing/dressing/bathroom;Assistance with cooking/housework;Assist for transportation   Equipment Recommendations  None recommended by OT    Recommendations for Other Services      Precautions / Restrictions Precautions Precautions: Fall Restrictions Weight Bearing Restrictions: No       Mobility Bed Mobility Overal bed  mobility: Modified Independent Bed Mobility: Supine to Sit     Supine to sit: HOB elevated          Transfers Overall transfer level: Needs assistance Equipment used: Rolling walker (2 wheels) Transfers: Sit to/from Stand Sit to Stand: Min guard           General transfer comment: due to pain into BLE     Balance Overall balance assessment: Needs assistance Sitting-balance support: Feet supported Sitting balance-Leahy Scale: Good     Standing balance support: Bilateral upper extremity supported, During functional activity Standing balance-Leahy Scale: Fair                             ADL either performed or assessed with clinical judgement   ADL Overall ADL's : Needs assistance/impaired Eating/Feeding: Independent;Sitting   Grooming: Wash/dry hands;Wash/dry face;Min guard;Standing   Upper Body Bathing: Set up;Sitting   Lower Body Bathing: Sit to/from stand;Minimal assistance   Upper Body Dressing : Set up;Sitting   Lower Body Dressing: Cueing for safety;Cueing for sequencing;Sit to/from stand;Minimal assistance   Toilet Transfer: Set up;Cueing for safety;Cueing for sequencing   Toileting- Clothing Manipulation and Hygiene: Cueing for safety;Cueing for sequencing;Sit to/from stand;Minimal assistance       Functional mobility during ADLs: Min guard;Rolling walker (2 wheels)      Extremity/Trunk Assessment Upper Extremity Assessment Upper Extremity Assessment: Generalized weakness   Lower Extremity Assessment Lower Extremity Assessment: Defer to PT evaluation        Vision   Vision Assessment?: No apparent visual deficits  Perception     Praxis      Cognition Arousal/Alertness: Awake/alert Behavior During Therapy: WFL for tasks assessed/performed Overall Cognitive Status: Within Functional Limits for tasks assessed                                          Exercises      Shoulder Instructions        General Comments      Pertinent Vitals/ Pain       Pain Assessment Pain Assessment: 0-10 Pain Score: 7  Pain Location: low back and buttocks, BLE Pain Descriptors / Indicators: Constant, Aching, Sore Pain Intervention(s): Limited activity within patient's tolerance, Monitored during session, Repositioned  Home Living                                          Prior Functioning/Environment              Frequency  Min 2X/week        Progress Toward Goals  OT Goals(current goals can now be found in the care plan section)  Progress towards OT goals: Progressing toward goals  Acute Rehab OT Goals Patient Stated Goal: to get home OT Goal Formulation: With patient Time For Goal Achievement: 02/18/22 Potential to Achieve Goals: Good ADL Goals Pt Will Perform Lower Body Bathing: with modified independence;sit to/from stand Pt Will Perform Lower Body Dressing: with modified independence;sit to/from stand Pt Will Transfer to Toilet: with modified independence;ambulating Pt Will Perform Tub/Shower Transfer: with modified independence;ambulating;shower seat  Plan Discharge plan remains appropriate    Co-evaluation                 AM-PAC OT "6 Clicks" Daily Activity     Outcome Measure   Help from another person eating meals?: None Help from another person taking care of personal grooming?: None Help from another person toileting, which includes using toliet, bedpan, or urinal?: A Little Help from another person bathing (including washing, rinsing, drying)?: A Little Help from another person to put on and taking off regular upper body clothing?: None Help from another person to put on and taking off regular lower body clothing?: A Little 6 Click Score: 21    End of Session Equipment Utilized During Treatment: Rolling walker (2 wheels);Gait belt  OT Visit Diagnosis: Unsteadiness on feet (R26.81);Other abnormalities of gait and mobility  (R26.89);Repeated falls (R29.6);Muscle weakness (generalized) (M62.81)   Activity Tolerance Patient tolerated treatment well   Patient Left in chair;with call bell/phone within reach;with chair alarm set   Nurse Communication Mobility status        Time: 3810-1751 OT Time Calculation (min): 47 min  Charges: OT General Charges $OT Visit: 1 Visit OT Treatments $Self Care/Home Management : 38-52 mins  Joeseph Amor OTR/L  [place]  [phone] office number [phone] pager number   Joeseph Amor 02/09/2022, 10:35 AM

## 2022-02-09 NOTE — Progress Notes (Signed)
Physical Therapy Treatment Patient Details Name: Ann [name] MRN: [id number] DOB: 1950/07/15 Today's Date: 02/09/2022   History of Present Illness Ann [name] is a pleasant 71 y.o. female who presented 02/01/22 due to fatigue and missed 2 weeks of HD due to inability to utilize transport due to bed bugs. Pt required found to be anemic and required 2 units of PRBC.   PMH: CAD with PCI to RCA in 2010, DVT, hypertension, type 2 diabetes mellitus, OSA not on CPAP, chronic back pain, and ESRD    PT Comments    Pt progressing with mobility. Today's session focused on ambulation with rollator and sit to stands for functional strength, demonstrating good carryover of tasks throughout session, especially rollator safety. Pt ambulated in room, would benefit from continued training for gait endurance. Pt remains limited by generalized weakness, decreased activity tolerance, and impaired balance strategies/postural reactions. Pt pleasant and motivated to work with PT services, continue to recommend acute PT services to maximize functional mobility and independence prior to return home with HHPT.    Recommendations for follow up therapy are one component of a multi-disciplinary discharge planning process, led by the attending physician.  Recommendations may be updated based on patient status, additional functional criteria and insurance authorization.  Follow Up Recommendations  Home health PT     Assistance Recommended at Discharge Intermittent Supervision/Assistance  Patient can return home with the following Assistance with cooking/housework;Direct supervision/assist for financial management;Assist for transportation;Help with stairs or ramp for entrance;Direct supervision/assist for medications management;A little help with walking and/or transfers   Equipment Recommendations  Rollator (4 wheels)    Recommendations for Other Services       Precautions / Restrictions Precautions Precautions:  Fall Restrictions Weight Bearing Restrictions: No     Mobility  Bed Mobility Overal bed mobility: Needs Assistance Bed Mobility: Supine to Sit     Supine to sit: HOB elevated, Modified independent (Device/Increase time)     General bed mobility comments: Pt mod independent supine to sit with use of bed rail. Pt able to scoot L/R in bed with no LOB noted    Transfers Overall transfer level: Needs assistance Equipment used: Rollator (4 wheels) Transfers: Sit to/from Stand Sit to Stand: Supervision           General transfer comment: Extensive education provided regarding rollator safety    Ambulation/Gait Ambulation/Gait assistance: Min guard Gait Distance (Feet): 30 Feet Assistive device: Rollator (4 wheels) Gait Pattern/deviations: Step-to pattern, Decreased stride length Gait velocity: decreased     General Gait Details: Two trials of 36f ambulation min guard with awareness of obstacle negotiation. Pt able to reposition rollator safely during ambulation as approaching recliner. Verbal report of increased back pain at end of second trial   Stairs             Wheelchair Mobility    Modified Rankin (Stroke Patients Only)       Balance Overall balance assessment: Needs assistance Sitting-balance support: Feet supported, No upper extremity supported Sitting balance-Leahy Scale: Good Sitting balance - Comments: Maintain prolonged sitting balance edge of recliner with no LOB noted   Standing balance support: Bilateral upper extremity supported, During functional activity, Single extremity supported Standing balance-Leahy Scale: Fair Standing balance comment: Able to demonstrate dynamic standing balance by removing purewick and repositioning mesh underwear                            Cognition Arousal/Alertness: Awake/alert  Behavior During Therapy: WFL for tasks assessed/performed Overall Cognitive Status: Within Functional Limits for tasks  assessed                                 General Comments: Pt AOx4, able to return to previous conversation after interruption cues for safety.        Exercises General Exercises - Lower Extremity Ankle Circles/Pumps: AROM, 10 reps Straight Leg Raises: AROM, 10 reps Other Exercises Other Exercises: 5 sit to stands, requiring redirection to task Other Exercises: Long sit calf stretch bilaterally    General Comments General comments (skin integrity, edema, etc.): VSS on RA      Pertinent Vitals/Pain Pain Assessment Pain Assessment: 0-10 Pain Score: 9  Pain Location: low back and buttocks, BLE Pain Descriptors / Indicators: Constant, Aching, Sore Pain Intervention(s): Monitored during session, Repositioned    Home Living                          Prior Function            PT Goals (current goals can now be found in the care plan section) Acute Rehab PT Goals Patient Stated Goal: return home PT Goal Formulation: With patient Time For Goal Achievement: 02/18/22 Potential to Achieve Goals: Good Progress towards PT goals: Progressing toward goals    Frequency    Min 3X/week      PT Plan Current plan remains appropriate    Co-evaluation              AM-PAC PT "6 Clicks" Mobility   Outcome Measure  Help needed turning from your back to your side while in a flat bed without using bedrails?: None Help needed moving from lying on your back to sitting on the side of a flat bed without using bedrails?: None Help needed moving to and from a bed to a chair (including a wheelchair)?: A Little Help needed standing up from a chair using your arms (e.g., wheelchair or bedside chair)?: A Little Help needed to walk in hospital room?: A Little Help needed climbing 3-5 steps with a railing? : A Little 6 Click Score: 20    End of Session Equipment Utilized During Treatment: Gait belt Activity Tolerance: Patient tolerated treatment well Patient  left: with call bell/phone within reach;in chair;with chair alarm set Nurse Communication: Mobility status PT Visit Diagnosis: Unsteadiness on feet (R26.81);Other abnormalities of gait and mobility (R26.89);Muscle weakness (generalized) (M62.81)     Time: 9242-6834 PT Time Calculation (min) (ACUTE ONLY): 27 min  Charges:  $Therapeutic Activity: 23-37 mins                     Chipper Oman, SPT    [facility] Clarence Cogswell 02/09/2022, 5:43 PM

## 2022-02-09 NOTE — TOC Progression Note (Addendum)
Transition of Care [facility]) - Progression Note    Patient Details  Name: Ann [name] MRN: [id number] Date of Birth: November 21, 1950  [facility] CM/SW Contact  Zenon Mayo, RN Phone Number: 02/09/2022, 4:34 PM  Clinical Narrative:    NCM called daughter , Ann [name], two time today, left message, did not get an answer.  Awaiting call back.  NCM asked patient if she thought she could stay with daughter while her home is being exterminatied, maybe transport will transport her to diaylsis then if she is staying with daughter, patient states she is not sure she will check with daughter.    10/20- NCM spoke with daughter Ann [name], she states she spoke with the [employer] and they said they were going to send an exterminator out there but did not say when.  She states patient can not come to stay with her because she does not have any room for her and plus she will have to put her on her lease and her rent will go up.  Also the restroom is up stairs and patient can not go up the stairs.  NCM will try to call Housing authority to see when the exterminator will go out.   10/20 14:20 NCM called housing authority and spoke with Ann [name] the housing Manager [phone] is her direct number, she states that she was just around that property and she states the exterminator will put them on the list and they will call her with the date that they will do the extermination, she then will call this NCM to let know what that date is, she states they will do a heat treatment then it will be the patient's responsibility to clean it up.  She states she hates that this happen to the patient and she will do what she can to let me know as soon as possible.     Expected Discharge Plan: [place] Barriers to Discharge: Transportation  Expected Discharge Plan and Services Expected Discharge Plan: [place]       Living arrangements for the past 2 months: Single  Family Home                                       Social Determinants of Health (SDOH) Interventions    Readmission Risk Interventions     No data to display

## 2022-02-10 DIAGNOSIS — E1122 Type 2 diabetes mellitus with diabetic chronic kidney disease: Secondary | ICD-10-CM | POA: Diagnosis not present

## 2022-02-10 DIAGNOSIS — D649 Anemia, unspecified: Secondary | ICD-10-CM | POA: Diagnosis not present

## 2022-02-10 DIAGNOSIS — I5033 Acute on chronic diastolic (congestive) heart failure: Secondary | ICD-10-CM | POA: Diagnosis not present

## 2022-02-10 DIAGNOSIS — N186 End stage renal disease: Secondary | ICD-10-CM | POA: Diagnosis not present

## 2022-02-10 LAB — COMPREHENSIVE METABOLIC PANEL
ALT: 16 U/L (ref 0–44)
AST: 20 U/L (ref 15–41)
Albumin: 3 g/dL — ABNORMAL LOW (ref 3.5–5.0)
Alkaline Phosphatase: 61 U/L (ref 38–126)
Anion gap: 10 (ref 5–15)
BUN: 39 mg/dL — ABNORMAL HIGH (ref 8–23)
CO2: 29 mmol/L (ref 22–32)
Calcium: 9.1 mg/dL (ref 8.9–10.3)
Chloride: 95 mmol/L — ABNORMAL LOW (ref 98–111)
Creatinine, Ser: 4.76 mg/dL — ABNORMAL HIGH (ref 0.44–1.00)
GFR, Estimated: 9 mL/min — ABNORMAL LOW (ref >60)
Glucose, Bld: 121 mg/dL — ABNORMAL HIGH (ref 70–99)
Potassium: 4.7 mmol/L (ref 3.5–5.1)
Sodium: 134 mmol/L — ABNORMAL LOW (ref 135–145)
Total Bilirubin: 0.6 mg/dL (ref 0.3–1.2)
Total Protein: 5.9 g/dL — ABNORMAL LOW (ref 6.5–8.1)

## 2022-02-10 LAB — CBC
HCT: 33.7 % — ABNORMAL LOW (ref 36.0–46.0)
Hemoglobin: 11.2 g/dL — ABNORMAL LOW (ref 12.0–15.0)
MCH: 30.8 pg (ref 26.0–34.0)
MCHC: 33.2 g/dL (ref 30.0–36.0)
MCV: 92.6 fL (ref 80.0–100.0)
Platelets: 157 10*3/uL (ref 150–400)
RBC: 3.64 MIL/uL — ABNORMAL LOW (ref 3.87–5.11)
RDW: 14.8 % (ref 11.5–15.5)
WBC: 5.2 10*3/uL (ref 4.0–10.5)
nRBC: 0 % (ref 0.0–0.2)

## 2022-02-10 LAB — GLUCOSE, CAPILLARY
Glucose-Capillary: 98 mg/dL (ref 70–99)
Glucose-Capillary: 147 mg/dL — ABNORMAL HIGH (ref 70–99)
Glucose-Capillary: 237 mg/dL — ABNORMAL HIGH (ref 70–99)

## 2022-02-10 MED ORDER — HEPARIN SODIUM (PORCINE) 1000 UNIT/ML DIALYSIS
20.0000 [IU]/kg | INTRAMUSCULAR | Status: DC | PRN
  Administered 2022-02-10: 1700 [IU] via INTRAVENOUS_CENTRAL
  Filled 2022-02-10 (×2): qty 2

## 2022-02-10 MED ORDER — AMLODIPINE BESYLATE 5 MG PO TABS
5.0000 mg | ORAL_TABLET | Freq: Every morning | ORAL | Status: DC
  Administered 2022-02-11 – 2022-02-18 (×8): 5 mg via ORAL
  Filled 2022-02-10 (×8): qty 1

## 2022-02-10 MED ORDER — DARBEPOETIN ALFA 40 MCG/0.4ML IJ SOSY
40.0000 ug | PREFILLED_SYRINGE | INTRAMUSCULAR | Status: DC
  Administered 2022-02-10: 40 ug via INTRAVENOUS
  Filled 2022-02-10 (×2): qty 0.4

## 2022-02-10 NOTE — Progress Notes (Signed)
[facility] Progress Note  Subjective: No new issues -  for HD today   Vitals:   02/09/22 2006 02/10/22 0617 02/10/22 0817 02/10/22 0823  BP: 137/62 (!) 104/47 135/69   Pulse: 79 84  96  Resp: 19 19    Temp: 98.1 F (36.7 C) 98.1 F (36.7 C)    TempSrc: Oral Oral    SpO2: 97% 96%    Weight:      Height:        Exam: Gen alert, no distress No jvd or bruits Chest clear bilat to bases RRR no MRG Abd soft ntnd no mass or ascites +bs Ext no LE edema Neuro is alert, Ox 3 , nf    RUA AVF+bruit    Home meds include - albuterol, amlodipine 10, apixaban, duloxetine, hydralazine 50 tid, norco prn, isosorbide mononitrate 30, linagliptin, metoprolol 25 qd, pantoprazole, pravastatin, ropinirole, prns/ supps/ vits    OP HD: MWF [place] 4h  400/ 500  78.6kg  3K/2.5 bath  Hep 2000  RUA AVF - last HD 9/27, post wt 79kg - last Hb 9.7 on 9/27 - mircera 30 q2, last 9/27 - venofer '100mg'$  weekly, last 9/27 - doxercalciferol 2 ug IV once per wk, last 9/27   Assessment/ Plan: Anemia symptomatic - Hb 7's, likely due to CKD. S/p 2 units prbcs, and got darbe 60 ug w/ HD 10/13 here. Hb 10- 11 now ESRD - HD MWF.  Next HD 10/20-  Friday on schedule via AVF  Need for continued dialysis - has been on x 1 year, she states she voids "a lot, all the time" and creat here was 4 on admission despite having missed HD 2 wks of HD.   24 hr creat clearance only came out at 5 and with albumin of 3.0 dont think she could come off of HD HD transportation - have consulted Care Management to look into her bedbug situation. (bedbugs causing transportation company to refuse taking her to/ from outpt HD).  She says her brother is in the pest control business and granddaughter stays there-  I have urged her to come up with some kind of plan for treatment of bed bugs as that would help a lot  HTN - cont meds-   on norvasc/hydralazine and lopressor , no gross vol overload on exam but with BP high could challenge  EDW to improve-   according to weights here-  very much above  EDW ? Will cont to challenge as able and might be able to wean BP meds -  BP has been good of late, will dec amlodipine from 10 to 5 H/o DVT - takes eliquis Anemia esrd - last esa was on 9/27 > we started darbe here at higher dose 60 ug weekly. Has been stable since transfusion -  dec to 40 for maintenance MBD ckd - Ca in range,  phos also good. Cont IV vdra weekly.  Dispo-  could be discharged from our standpoint    Louis Meckel  02/10/2022, 9:45 AM   Recent Labs  Lab 02/04/22 0041 02/05/22 0108 02/06/22 0032 02/08/22 0042 02/10/22 0045  HGB 11.1*   < > 10.9* 11.3* 11.2*  ALBUMIN 3.0*  --   --   --  3.0*  CALCIUM 8.3*  --  9.0  --  9.1  CREATININE 3.54*  --  4.93*  --  4.76*  K 4.8  --  5.3*  --  4.7   < > = values in  this interval not displayed.   No results for input(s): "IRON", "TIBC", "FERRITIN" in the last 168 hours.  Inpatient medications:  amLODipine  10 mg Oral q AM   apixaban  5 mg Oral BID   Chlorhexidine Gluconate Cloth  6 each Topical Q0600   darbepoetin (ARANESP) injection - DIALYSIS  40 mcg Intravenous Q Fri-HD   doxercalciferol  2 mcg Intravenous Q Fri-HD   DULoxetine  60 mg Oral q AM   feeding supplement  237 mL Oral BID BM   hydrALAZINE  50 mg Oral TID   hydrocerin   Topical BID   insulin aspart  0-5 Units Subcutaneous QHS   insulin aspart  0-6 Units Subcutaneous TID WC   isosorbide mononitrate  30 mg Oral q AM   lidocaine  1 patch Transdermal Q24H   metoprolol tartrate  25 mg Oral Daily   multivitamin  1 tablet Oral QHS   pantoprazole  20 mg Oral q AM   pravastatin  40 mg Oral q AM   rOPINIRole  0.25 mg Oral QHS   sodium chloride flush  3 mL Intravenous Q12H    sodium chloride     sodium chloride, acetaminophen **OR** acetaminophen, heparin, HYDROcodone-acetaminophen, hydrOXYzine, mouth rinse, sodium chloride flush

## 2022-02-10 NOTE — Progress Notes (Signed)
Nutrition Follow-up  DOCUMENTATION CODES:   Not applicable  INTERVENTION:  Encourage adequate PO intake Continue Ensure Enlive po BID, each supplement provides 350 kcal and 20 grams of protein. Renal MVI with minerals daily  NUTRITION DIAGNOSIS:   Increased nutrient needs related to acute illness as evidenced by estimated needs.  Ongoing  GOAL:   Patient will meet greater than or equal to 90% of their needs  Goal met  MONITOR:   PO intake, Supplement acceptance, Labs, Weight trends  REASON FOR ASSESSMENT:   Consult, Malnutrition Screening Tool Other (Comment) (nutrition goals)  ASSESSMENT:   Pt admitted with fatigue and weakness r/t symptomatic anemia. Pt missed 2 weeks of HD d/t bedbugs leading to inability to utilize transportation services. PMH significant for ESRD on HD, T2DM, CAD, HTN, chronic anemia, DVT on eliquis, diastolic CHF.  Per MD documentation, medically stable for d/c pending safe dispo.  Pt off unit for HD. Consuming 100% of meals and accepting all nutrition supplements.   Meal completions:  100% x 8 recorded meals (10/17-10/20)  Last HD 10/18- net UF 4L  Post HD weight 82.5 kg  Medications: hectorol, SSI 0-5 units qhs, SSI 0-6 units TID, rena-vit, protonix  Labs: sodium 134, BUN 39, Cr 4.76, GFR 9, CBG's 98-161 x24 hours  UOP: 261m x12 hours + 4518mx12 hours I/O's: -6.5L since admit  Diet Order:   Diet Order             Diet Carb Modified Fluid consistency: Thin; Room service appropriate? Yes  Diet effective now                   EDUCATION NEEDS:   No education needs have been identified at this time  Skin:  Skin Assessment: Reviewed RN Assessment  Last BM:  10/18  Height:   Ht Readings from Last 1 Encounters:  02/03/22 5' 2"  (1.575 m)    Weight:   Wt Readings from Last 1 Encounters:  02/10/22 83.7 kg    Ideal Body Weight:  50 kg  BMI:  Body mass index is 33.75 kg/m.  Estimated Nutritional Needs:   Kcal:   1400-1600  Protein:  70-85g  Fluid:  1L + UOP  AlClayborne DanaRDN, LDN Clinical Nutrition

## 2022-02-10 NOTE — Progress Notes (Signed)
  Progress Note   Patient: Ann [name] KJZ:[id number] DOB: 1950-08-28 DOA: 02/01/2022     9 DOS: the patient was seen and examined on 02/10/2022   Brief hospital course: 71/F with history of ESRD, type 2 diabetes mellitus, CAD, hypertension, chronic anemia, history of DVT on Eliquis, diastolic CHF presented to the ED with fatigue and weakness.  Missed 2 weeks of dialysis on account of bedbugs, transportation service would not take her until bedbugs were exterminated. -In the ED hemoglobin was 7.6, potassium was normal, still makes urine -Transfused 2 units of PRBC -Medically stable after resuming HD, awaiting safe dispo, has no transportation back and forth during hemodialysis, TOC following    Assessment and Plan: Normocytic anemia -Likely primarily secondary to CKD and hemodilution -Transfused 2 units of PRBC earlier this visit, no overt bleeding -Continue EPO with HD, hemoglobin improved and stable -Ambulate, PT OT eval> Home health PT recommended -Discharge planning, social work consulted to assist with HD transportation, patient reports not having any way to go back and forth to HD without transportation, ToC continues to follow for d/c planning   ESRD on hemodialysis -Missed 2 weeks of hemodialysis on account of loss of transportation due to bedbug infestation -Social work following -Nephrology following, still recommending HD despite voiding. See nephro note -Patient tells me she has no friends or family that can transport her to hemodialysis, TOC following to assist with HD transportation, bedbug extermination-asked pt to ask family for help   History of DVT -Continue Eliquis   Acute on chronic diastolic CHF -Volume managed with HD   CAD -Stable continue metoprolol and Pravachol   Hypertension -Continue Norvasc, hydralazine, Imdur, Lopressor      Subjective: Seen on HD, pt without complaints  Physical Exam: Vitals:   02/10/22 1130 02/10/22 1200 02/10/22 1230  02/10/22 1300  BP: (!) 158/64 (!) 140/70 139/70 (!) 140/68  Pulse: 68 65 68 69  Resp: '11 13 17 18  '$ Temp:      TempSrc:      SpO2: 98% 95% 99% 99%  Weight:      Height:       General exam: Conversant, in no acute distress Respiratory system: normal chest rise, clear, no audible wheezing Cardiovascular system: regular rhythm, s1-s2 Gastrointestinal system: Nondistended, nontender, pos BS Central nervous system: No seizures, no tremors Extremities: No cyanosis, no joint deformities Skin: No rashes, no pallor Psychiatry: Affect normal // no auditory hallucinations   Data Reviewed:  There are no new results to review at this time.  Family Communication: pt in room, family not at bedside  Disposition: Status is: Inpatient Remains inpatient appropriate because: Severity of illness  Planned Discharge Destination:  Unclear at this time    Author: Marylu Lund, MD 02/10/2022 2:48 PM  For on call review [email or web address].

## 2022-02-10 NOTE — TOC Progression Note (Addendum)
Transition of Care [facility]) - Progression Note    Patient Details  Name: Ann [name] MRN: [id number] Date of Birth: 1950/05/20  [facility] CM/SW Contact  Ann Mayo, RN Phone Number: 02/10/2022, 2:33 PM  Clinical Narrative:     NCM spoke with daughter Ann [name], she states she spoke with the [employer] and they said they were going to send an exterminator out there but did not say when.  She states patient can not come to stay with her because she does not have any room for her and plus she will have to put her on her lease and her rent will go up.  Also the restroom is up stairs and patient can not go up the stairs.  NCM will try to call Housing authority to see when the exterminator will go out.    10/20 14:20 NCM called housing authority and spoke with Ann [name] the housing Manager [phone] is her direct number, she states that she was just around that property and she states the exterminator will put them on the list and they will call her with the date that they will do the extermination, she then will call this NCM to let know what that date is, she states they will do a heat treatment then it will be the patient's responsibility to clean it up.  She states she hates that this happen to the patient and she will do what she can to let me know as soon as possible.    1656 NCM received call from [facility] , she states the exterminator will be at the patient's house on Tuesday 10/24 at 1 pm to exterminate, so she could be discharged on 10/25 Wed.  NCM offered choice to patient for HHPT, [place] with [email or web address]. She states she does not have a preference,  NCM also offered choice for rollator ,  woth Medicare. [place] she states she does not have a preference, but she states she has a walker at home and she has not had it for five years yet. This NCM informed her that her insurance may not pay for another walker until it is more than 72 years old.  NCM made referral to [facility]  with [place] for [place], [place], she is able to take referral.  Soc will begin 24 to 48 hrs post dc.     Expected Discharge Plan: [place] Barriers to Discharge: Transportation  Expected Discharge Plan and Services Expected Discharge Plan: [place]       Living arrangements for the past 2 months: Single Family Home                                       Social Determinants of Health (SDOH) Interventions    Readmission Risk Interventions     No data to display

## 2022-02-10 NOTE — Progress Notes (Signed)
Received patient in bed to unit.  Alert and oriented.  Informed consent signed and in chart.   Treatment initiated: 1113 Treatment completed: 1522  Patient tolerated well.  Transported back to the room  Alert, without acute distress.  Hand-off given to patient's nurse.   Access used: fistula Access issues: none  Total UF removed: 3400 Medication(s) given: aranesp, hectorol Post HD VS: 98.2, 141/65(87), HR-73, RR12, SP02-98 Post HD weight: 80.1kg   [facility]

## 2022-02-10 NOTE — Progress Notes (Signed)
   02/10/22 1134  Mobility  Activity Off unit   Mobility Specialist Progress Note  Pt off unit. Will f/u if time allows.  Lucious Groves Mobility Specialist

## 2022-02-11 DIAGNOSIS — E1122 Type 2 diabetes mellitus with diabetic chronic kidney disease: Secondary | ICD-10-CM | POA: Diagnosis not present

## 2022-02-11 DIAGNOSIS — N186 End stage renal disease: Secondary | ICD-10-CM | POA: Diagnosis not present

## 2022-02-11 DIAGNOSIS — I5033 Acute on chronic diastolic (congestive) heart failure: Secondary | ICD-10-CM | POA: Diagnosis not present

## 2022-02-11 DIAGNOSIS — D649 Anemia, unspecified: Secondary | ICD-10-CM | POA: Diagnosis not present

## 2022-02-11 LAB — GLUCOSE, CAPILLARY
Glucose-Capillary: 119 mg/dL — ABNORMAL HIGH (ref 70–99)
Glucose-Capillary: 209 mg/dL — ABNORMAL HIGH (ref 70–99)
Glucose-Capillary: 231 mg/dL — ABNORMAL HIGH (ref 70–99)

## 2022-02-11 MED ORDER — CHLORHEXIDINE GLUCONATE CLOTH 2 % EX PADS
6.0000 | MEDICATED_PAD | Freq: Every day | CUTANEOUS | Status: DC
  Administered 2022-02-12 – 2022-02-14 (×3): 6 via TOPICAL

## 2022-02-11 NOTE — Progress Notes (Signed)
  Progress Note   Patient: Ann [name] [id number]:[id number] DOB: [birth year] DOA: 02/01/2022     10 DOS: the patient was seen and examined on 02/11/2022   Brief hospital course: 71/F with history of ESRD, type 2 diabetes mellitus, CAD, hypertension, chronic anemia, history of DVT on Eliquis, diastolic CHF presented to the ED with fatigue and weakness.  Missed 2 weeks of dialysis on account of bedbugs, transportation service would not take her until bedbugs were exterminated. -In the ED hemoglobin was 7.6, potassium was normal, still makes urine -Transfused 2 units of PRBC -Medically stable after resuming HD, awaiting safe dispo, has no transportation back and forth during hemodialysis, TOC following    Assessment and Plan: Normocytic anemia -Likely primarily secondary to CKD and hemodilution -Transfused 2 units of PRBC earlier this visit, no overt bleeding -Continue EPO with HD, hemoglobin improved and stable -Ambulate, PT OT eval> Home health PT recommended -Discharge planning in progress. Issues with bed bugs at home, hindering transportation to and from dialysis. Per TOC, exterminator is now scheduled for early next week.   ESRD on hemodialysis -Missed 2 weeks of hemodialysis on account of loss of transportation due to bedbug infestation -Social work following -Nephrology following, still recommending HD despite voiding. See nephro note -Patient tells me she has no friends or family that can transport her to hemodialysis, TOC following to assist with HD transportation, bedbug extermination planned for next week   History of DVT -Continue Eliquis   Acute on chronic diastolic CHF -Volume managed with HD   CAD -Stable continue metoprolol and Pravachol   Hypertension -Continue Norvasc, hydralazine, Imdur, Lopressor      Subjective: Without complaints  Physical Exam: Vitals:   02/10/22 1559 02/10/22 2007 02/11/22 0419 02/11/22 0839  BP:  (!) 144/61 (!) 157/65 (!) 146/62  Pulse:   85 87 91  Resp:  13 13   Temp:  98.6 F (37 C) 98.1 F (36.7 C)   TempSrc:  Oral Oral   SpO2:  99% 99%   Weight: 80.1 kg  79.9 kg   Height:       General exam: Conversant, in no acute distress Respiratory system: normal chest rise, clear, no audible wheezing Cardiovascular system: regular rhythm, s1-s2 Gastrointestinal system: Nondistended, nontender, pos BS Central nervous system: No seizures, no tremors Extremities: No cyanosis, no joint deformities Skin: No rashes, no pallor Psychiatry: Affect normal // no auditory hallucinations   Data Reviewed:  There are no new results to review at this time.  Family Communication: pt in room, family not at bedside  Disposition: Status is: Inpatient Remains inpatient appropriate because: Severity of illness  Planned Discharge Destination:  Unclear at this time    Author: Marylu Lund, MD 02/11/2022 3:52 PM  For on call review [email or web address].

## 2022-02-11 NOTE — Progress Notes (Signed)
[facility] Progress Note  Subjective: HD yest-  removed 3400  tolerated well -  going to get bed bugs exterminated on Tuesday-  upset that she was not given enough for breakfast  Vitals:   02/10/22 1530 02/10/22 1559 02/10/22 2007 02/11/22 0419  BP: (!) 141/65  (!) 144/61 (!) 157/65  Pulse: 73  85 87  Resp: '12  13 13  '$ Temp: 98.2 F (36.8 C)  98.6 F (37 C) 98.1 F (36.7 C)  TempSrc: Oral  Oral Oral  SpO2: 98%  99% 99%  Weight:  80.1 kg  79.9 kg  Height:        Exam: Gen alert, no distress No jvd or bruits Chest clear bilat to bases RRR no MRG Abd soft ntnd no mass or ascites +bs Ext no LE edema Neuro is alert, Ox 3 , nf    RUA AVF+bruit    Home meds include - albuterol, amlodipine 10, apixaban, duloxetine, hydralazine 50 tid, norco prn, isosorbide mononitrate 30, linagliptin, metoprolol 25 qd, pantoprazole, pravastatin, ropinirole, prns/ supps/ vits    OP HD: MWF [facility] 4h  400/ 500  78.6kg  3K/2.5 bath  Hep 2000  RUA AVF - last HD 9/27, post wt 79kg - last Hb 9.7 on 9/27 - mircera 30 q2, last 9/27 - venofer '100mg'$  weekly, last 9/27 - doxercalciferol 2 ug IV once per wk, last 9/27   Assessment/ Plan: Anemia symptomatic - Hb 7's, likely due to CKD. S/p 2 units prbcs, and got darbe 60 ug w/ HD 10/13 here. Hb 10- 11 now-  decreased dose to 40 ESRD - HD MWF.  Next HD Monday 10/23-  on schedule via AVF  Need for continued dialysis - has been on x 1 year, she states she voids "a lot, all the time" and creat here was 4 on admission despite having missed HD 2 wks of HD.   24 hr creat clearance only came out at 5 and with albumin of 3.0 cannot come off of HD HD transportation - have consulted Care Management to look into her bedbug situation. (bedbugs causing transportation company to refuse taking her to/ from outpt HD).  She says her brother is in the pest control business and granddaughter stays there-  I have urged her to come up with some kind of plan for  treatment of bed bugs as that would help a lot -  to get extermination Tuesday-  then pt can go home Wed I assume after HD HTN -   on norvasc/hydralazine and lopressor , no gross vol overload on exam but with BP high could challenge EDW to improve-   according to weights here-  very much above  EDW -  have been challenging-  close to EDW post HD yest.   BP has been good of late, dec amlodipine from 10 to 5-  calls into question BP med compliance at home H/o DVT - takes eliquis Anemia esrd - last esa was on 9/27 > we started darbe here at higher dose 60 ug weekly. Has been stable since transfusion -  dec to 40 for maintenance MBD ckd - Ca in range,  phos also good- no binder . Cont IV vdra weekly.  Dispo-    now looks like will go on Wednesday   Pt will not be physically seen on Sunday-  revisit on Monday -  HD will be arranged for Monday-  call with questions    Louis Meckel  02/11/2022, 8:14 AM  Recent Labs  Lab 02/06/22 0032 02/08/22 0042 02/10/22 0045  HGB 10.9* 11.3* 11.2*  ALBUMIN  --   --  3.0*  CALCIUM 9.0  --  9.1  CREATININE 4.93*  --  4.76*  K 5.3*  --  4.7   No results for input(s): "IRON", "TIBC", "FERRITIN" in the last 168 hours.  Inpatient medications:  amLODipine  5 mg Oral q AM   apixaban  5 mg Oral BID   Chlorhexidine Gluconate Cloth  6 each Topical Q0600   darbepoetin (ARANESP) injection - DIALYSIS  40 mcg Intravenous Q Fri-HD   doxercalciferol  2 mcg Intravenous Q Fri-HD   DULoxetine  60 mg Oral q AM   feeding supplement  237 mL Oral BID BM   hydrALAZINE  50 mg Oral TID   hydrocerin   Topical BID   insulin aspart  0-5 Units Subcutaneous QHS   insulin aspart  0-6 Units Subcutaneous TID WC   isosorbide mononitrate  30 mg Oral q AM   lidocaine  1 patch Transdermal Q24H   metoprolol tartrate  25 mg Oral Daily   multivitamin  1 tablet Oral QHS   pantoprazole  20 mg Oral q AM   pravastatin  40 mg Oral q AM   rOPINIRole  0.25 mg Oral QHS   sodium  chloride flush  3 mL Intravenous Q12H    sodium chloride     sodium chloride, acetaminophen **OR** acetaminophen, HYDROcodone-acetaminophen, hydrOXYzine, mouth rinse, sodium chloride flush

## 2022-02-12 DIAGNOSIS — I5033 Acute on chronic diastolic (congestive) heart failure: Secondary | ICD-10-CM | POA: Diagnosis not present

## 2022-02-12 DIAGNOSIS — D649 Anemia, unspecified: Secondary | ICD-10-CM | POA: Diagnosis not present

## 2022-02-12 DIAGNOSIS — N186 End stage renal disease: Secondary | ICD-10-CM | POA: Diagnosis not present

## 2022-02-12 DIAGNOSIS — E1122 Type 2 diabetes mellitus with diabetic chronic kidney disease: Secondary | ICD-10-CM | POA: Diagnosis not present

## 2022-02-12 LAB — GLUCOSE, CAPILLARY
Glucose-Capillary: 144 mg/dL — ABNORMAL HIGH (ref 70–99)
Glucose-Capillary: 167 mg/dL — ABNORMAL HIGH (ref 70–99)
Glucose-Capillary: 207 mg/dL — ABNORMAL HIGH (ref 70–99)
Glucose-Capillary: 156 mg/dL — ABNORMAL HIGH (ref 70–99)

## 2022-02-12 NOTE — Progress Notes (Signed)
  Progress Note   Patient: Ann [name] [id number]:[id number] DOB: 1950/06/20 DOA: 02/01/2022     11 DOS: the patient was seen and examined on 02/12/2022   Brief hospital course: 71/F with history of ESRD, type 2 diabetes mellitus, CAD, hypertension, chronic anemia, history of DVT on Eliquis, diastolic CHF presented to the ED with fatigue and weakness.  Missed 2 weeks of dialysis on account of bedbugs, transportation service would not take her until bedbugs were exterminated. -In the ED hemoglobin was 7.6, potassium was normal, still makes urine -Transfused 2 units of PRBC -Medically stable after resuming HD, awaiting safe dispo, has no transportation back and forth during hemodialysis, TOC following    Assessment and Plan: Normocytic anemia -Likely primarily secondary to CKD and hemodilution -Transfused 2 units of PRBC earlier this visit, no overt bleeding -Continue EPO with HD, hemoglobin improved and stable -Ambulate, PT OT eval> Home health PT recommended -Discharge planning in progress. Issues with bed bugs at home, hindering transportation to and from dialysis. Per TOC, exterminator is now scheduled for early next week. -Hemodynamically stable at this time   ESRD on hemodialysis -Missed 2 weeks of hemodialysis on account of loss of transportation due to bedbug infestation -Social work following -Nephrology following, still recommending HD despite voiding. See nephro note -Patient tells me she has no friends or family that can transport her to hemodialysis, TOC following to assist with HD transportation, bedbug extermination planned for next week   History of DVT -Continue Eliquis   Acute on chronic diastolic CHF -Volume managed with HD   CAD -Stable continue metoprolol and Pravachol   Hypertension -Continue Norvasc, hydralazine, Imdur, Lopressor      Subjective: Complaining of chronic back pain this AM and some decreased appetite  Physical Exam: Vitals:   02/11/22 1709  02/11/22 2002 02/12/22 0500 02/12/22 1142  BP: 135/62 (!) 127/58 (!) 159/86 (!) 150/63  Pulse:  79 90 79  Resp:   18 19  Temp:  97.7 F (36.5 C) 97.7 F (36.5 C) 97.9 F (36.6 C)  TempSrc:  Oral Oral Oral  SpO2:  99% 100% 95%  Weight:   84 kg   Height:       General exam: Awake, laying in bed, in nad Respiratory system: Normal respiratory effort, no wheezing Cardiovascular system: regular rate, s1, s2 Gastrointestinal system: Soft, nondistended, positive BS Central nervous system: CN2-12 grossly intact, strength intact Extremities: Perfused, no clubbing Skin: Normal skin turgor, no notable skin lesions seen Psychiatry: Mood normal // no visual hallucinations   Data Reviewed:  There are no new results to review at this time.  Family Communication: pt in room, family not at bedside  Disposition: Status is: Inpatient Remains inpatient appropriate because: Severity of illness  Planned Discharge Destination:  Unclear at this time    Author: Marylu Lund, MD 02/12/2022 4:41 PM  For on call review [email or web address].

## 2022-02-13 DIAGNOSIS — D649 Anemia, unspecified: Secondary | ICD-10-CM | POA: Diagnosis not present

## 2022-02-13 DIAGNOSIS — N186 End stage renal disease: Secondary | ICD-10-CM | POA: Diagnosis not present

## 2022-02-13 DIAGNOSIS — I5033 Acute on chronic diastolic (congestive) heart failure: Secondary | ICD-10-CM | POA: Diagnosis not present

## 2022-02-13 DIAGNOSIS — E1122 Type 2 diabetes mellitus with diabetic chronic kidney disease: Secondary | ICD-10-CM | POA: Diagnosis not present

## 2022-02-13 LAB — RENAL FUNCTION PANEL
Albumin: 3.2 g/dL — ABNORMAL LOW (ref 3.5–5.0)
Anion gap: 8 (ref 5–15)
BUN: 76 mg/dL — ABNORMAL HIGH (ref 8–23)
CO2: 27 mmol/L (ref 22–32)
Calcium: 9.1 mg/dL (ref 8.9–10.3)
Chloride: 95 mmol/L — ABNORMAL LOW (ref 98–111)
Creatinine, Ser: 6.6 mg/dL — ABNORMAL HIGH (ref 0.44–1.00)
GFR, Estimated: 6 mL/min — ABNORMAL LOW (ref >60)
Glucose, Bld: 170 mg/dL — ABNORMAL HIGH (ref 70–99)
Phosphorus: 4.4 mg/dL (ref 2.5–4.6)
Potassium: 4.9 mmol/L (ref 3.5–5.1)
Sodium: 130 mmol/L — ABNORMAL LOW (ref 135–145)

## 2022-02-13 LAB — CBC
HCT: 34.8 % — ABNORMAL LOW (ref 36.0–46.0)
Hemoglobin: 11.6 g/dL — ABNORMAL LOW (ref 12.0–15.0)
MCH: 30.7 pg (ref 26.0–34.0)
MCHC: 33.3 g/dL (ref 30.0–36.0)
MCV: 92.1 fL (ref 80.0–100.0)
Platelets: 189 10*3/uL (ref 150–400)
RBC: 3.78 MIL/uL — ABNORMAL LOW (ref 3.87–5.11)
RDW: 14.4 % (ref 11.5–15.5)
WBC: 7.2 10*3/uL (ref 4.0–10.5)
nRBC: 0 % (ref 0.0–0.2)

## 2022-02-13 LAB — GLUCOSE, CAPILLARY
Glucose-Capillary: 150 mg/dL — ABNORMAL HIGH (ref 70–99)
Glucose-Capillary: 170 mg/dL — ABNORMAL HIGH (ref 70–99)
Glucose-Capillary: 207 mg/dL — ABNORMAL HIGH (ref 70–99)
Glucose-Capillary: 115 mg/dL — ABNORMAL HIGH (ref 70–99)

## 2022-02-13 MED ORDER — HEPARIN SODIUM (PORCINE) 1000 UNIT/ML DIALYSIS
20.0000 [IU]/kg | INTRAMUSCULAR | Status: DC | PRN
  Administered 2022-02-13: 2000 [IU] via INTRAVENOUS_CENTRAL
  Filled 2022-02-13 (×2): qty 2

## 2022-02-13 NOTE — Progress Notes (Signed)
  Progress Note   Patient: Ann [name] Hintz LDJ:[id number] DOB: June 20, 1950 DOA: 02/01/2022     12 DOS: the patient was seen and examined on 02/13/2022   Brief hospital course: 71/F with history of ESRD, type 2 diabetes mellitus, CAD, hypertension, chronic anemia, history of DVT on Eliquis, diastolic CHF presented to the ED with fatigue and weakness.  Missed 2 weeks of dialysis on account of bedbugs, transportation service would not take her until bedbugs were exterminated. -In the ED hemoglobin was 7.6, potassium was normal, still makes urine -Transfused 2 units of PRBC -Medically stable after resuming HD, awaiting safe dispo, has no transportation back and forth during hemodialysis, TOC following    Assessment and Plan: Normocytic anemia -Likely primarily secondary to CKD and hemodilution -Transfused 2 units of PRBC earlier this visit, no overt bleeding -Continue EPO with HD, hemoglobin improved and stable -Ambulate, PT OT eval> Home health PT recommended -Discharge planning in progress. Issues with bed bugs at home, hindering transportation to and from dialysis. Per TOC, exterminator is now scheduled for tomorrow -Hemodynamically stable at this time   ESRD on hemodialysis -Missed 2 weeks of hemodialysis on account of loss of transportation due to bedbug infestation -Social work following -Nephrology following, still recommending HD despite voiding. See nephro note -Patient tells me she has no friends or family that can transport her to hemodialysis, TOC following to assist with HD transportation, bedbug extermination planned for tomorrow with anticipated d/c after HD on Wed   History of DVT -Continue Eliquis   Acute on chronic diastolic CHF -Volume managed with HD   CAD -Stable continue metoprolol and Pravachol   Hypertension -Continue Norvasc, hydralazine, Imdur, Lopressor      Subjective: Seen on HD, without complaints  Physical Exam: Vitals:   02/13/22 1130 02/13/22 1200  02/13/22 1230 02/13/22 1348  BP: 117/75 127/73 (!) 128/102   Pulse:      Resp: 12 15 (!) 9   Temp:      TempSrc:      SpO2:      Weight:    78.5 kg  Height:       General exam: Conversant, in no acute distress Respiratory system: normal chest rise, clear, no audible wheezing Cardiovascular system: regular rhythm, s1-s2 Gastrointestinal system: Nondistended, nontender, pos BS Central nervous system: No seizures, no tremors Extremities: No cyanosis, no joint deformities Skin: No rashes, no pallor Psychiatry: Affect normal // no auditory hallucinations   Data Reviewed:  Labs reviewed: Na 130, K 4.9, Cr 6.60  Family Communication: pt in room, family not at bedside  Disposition: Status is: Inpatient Remains inpatient appropriate because: Severity of illness  Planned Discharge Destination: Home    Author: Marylu Lund, MD 02/13/2022 3:12 PM  For on call review [email or web address].

## 2022-02-13 NOTE — Progress Notes (Signed)
PT Cancellation Note  Patient Details Name: Ann [name] MRN: [id number] DOB: 28-Jul-1950   Cancelled Treatment:    Reason Eval/Treat Not Completed: Patient at procedure or test/unavailable.  Retry as time and pt allow.   Ramond Dial 02/13/2022, 11:50 AM  Mee Hives, PT PhD Acute Rehab Dept. Number: [place]

## 2022-02-13 NOTE — Progress Notes (Signed)
[facility] Progress Note  Subjective:  Seen and examined on dialysis.  Blood pressure 123/92 and HR 81. Tolerating goal.  Procedure supervised. Right AVF in use.   States as she understands plan is still for exterminators to come tomorrow   Review of systems:  Denies shortness of breath or chest pain  Denies n/v   Vitals:   02/13/22 0600 02/13/22 0752 02/13/22 0833 02/13/22 0857  BP: (!) 142/50 136/67  (!) 145/55  Pulse: 85 84  78  Resp: '18 18  12  '$ Temp: 98 F (36.7 C) 97.8 F (36.6 C)    TempSrc: Oral Oral    SpO2: 99% 97%  100%  Weight:   80.6 kg   Height:        Physical Exam:  General adult female in bed in no acute distress HEENT normocephalic atraumatic extraocular movements intact sclera anicteric Neck supple trachea midline Lungs clear to auscultation bilaterally normal work of breathing at rest  Heart S1S2 no rub Abdomen soft nontender nondistended Extremities no edema  Psych normal mood and affect Access RUE AVF in use    Home meds include - albuterol, amlodipine 10, apixaban, duloxetine, hydralazine 50 tid, norco prn, isosorbide mononitrate 30, linagliptin, metoprolol 25 qd, pantoprazole, pravastatin, ropinirole, prns/ supps/ vits    OP HD: MWF [facility] 4h  400/ 500  78.6kg  3K/2.5 bath  Hep 2000  RUA AVF - last HD 9/27, post wt 79kg - last Hb 9.7 on 9/27 - mircera 30 q2, last 9/27 - venofer '100mg'$  weekly, last 9/27 - doxercalciferol 2 ug IV once per wk, last 9/27   Assessment/ Plan: Anemia symptomatic - Hb 7's, likely due to CKD. S/p 2 units prbcs.  On aranesp 40 mcg weekly on Fridays.  ESRD - HD MWF.  Await today's labs.  Note that she does need continued dialysis - has been on x 1 year, she states she voids "a lot, all the time" and creat here was 4 on admission despite having missed HD 2 wks of HD.   24 hr creat clearance only came out at 5 and with albumin of 3.0 cannot come off of HD HD transportation - have consulted Care Management to look  into her bedbug situation. (bedbugs causing transportation company to refuse taking her to/ from outpt HD).  She says her brother is in the pest control business and granddaughter stays there-  I have urged her to come up with some kind of plan for treatment of bed bugs as that would help a lot -  to get extermination Tuesday-  then pt can go home Wed I assume after HD HTN -   optimize volume with HD H/o DVT - takes eliquis MBD ckd - phos good- no binder . Cont vdra weekly.  Dispo-    now looks like will go home this Wednesday, 10/25    Recent Labs  Lab 02/08/22 0042 02/10/22 0045  HGB 11.3* 11.2*  ALBUMIN  --  3.0*  CALCIUM  --  9.1  CREATININE  --  4.76*  K  --  4.7   No results for input(s): "IRON", "TIBC", "FERRITIN" in the last 168 hours.  Inpatient medications:  amLODipine  5 mg Oral q AM   apixaban  5 mg Oral BID   Chlorhexidine Gluconate Cloth  6 each Topical Q0600   darbepoetin (ARANESP) injection - DIALYSIS  40 mcg Intravenous Q Fri-HD   doxercalciferol  2 mcg Intravenous Q Fri-HD   DULoxetine  60  mg Oral q AM   feeding supplement  237 mL Oral BID BM   hydrALAZINE  50 mg Oral TID   hydrocerin   Topical BID   insulin aspart  0-5 Units Subcutaneous QHS   insulin aspart  0-6 Units Subcutaneous TID WC   isosorbide mononitrate  30 mg Oral q AM   lidocaine  1 patch Transdermal Q24H   metoprolol tartrate  25 mg Oral Daily   multivitamin  1 tablet Oral QHS   pantoprazole  20 mg Oral q AM   pravastatin  40 mg Oral q AM   rOPINIRole  0.25 mg Oral QHS   sodium chloride flush  3 mL Intravenous Q12H    sodium chloride     sodium chloride, acetaminophen **OR** acetaminophen, heparin, HYDROcodone-acetaminophen, hydrOXYzine, mouth rinse, sodium chloride flush  Claudia Desanctis, MD 9:32 AM 02/13/2022

## 2022-02-13 NOTE — Progress Notes (Addendum)
Received patient in bed to unit.  Alert and oriented.  Informed consent signed and in chart.   Treatment initiated: 0940 Treatment completed: 1255  Patient tolerated well. Transported back to the room  Alert, without acute distress.  Hand-off given to patient's nurse.   Access used: fistula right arm Access issues: no  Total UF removed: 3.4 liters Medication(s) given: Post HD VS: 125/71 MAP 87 HR 88 RR 14 Sat 97% on room air Post HD weight: 78.5 kg   [facility]

## 2022-02-14 DIAGNOSIS — I5033 Acute on chronic diastolic (congestive) heart failure: Secondary | ICD-10-CM | POA: Diagnosis not present

## 2022-02-14 DIAGNOSIS — N186 End stage renal disease: Secondary | ICD-10-CM | POA: Diagnosis not present

## 2022-02-14 DIAGNOSIS — E1122 Type 2 diabetes mellitus with diabetic chronic kidney disease: Secondary | ICD-10-CM | POA: Diagnosis not present

## 2022-02-14 DIAGNOSIS — D649 Anemia, unspecified: Secondary | ICD-10-CM | POA: Diagnosis not present

## 2022-02-14 LAB — GLUCOSE, CAPILLARY
Glucose-Capillary: 141 mg/dL — ABNORMAL HIGH (ref 70–99)
Glucose-Capillary: 236 mg/dL — ABNORMAL HIGH (ref 70–99)
Glucose-Capillary: 193 mg/dL — ABNORMAL HIGH (ref 70–99)
Glucose-Capillary: 208 mg/dL — ABNORMAL HIGH (ref 70–99)

## 2022-02-14 MED ORDER — CHLORHEXIDINE GLUCONATE CLOTH 2 % EX PADS: 6.0000 | MEDICATED_PAD | Freq: Every day | CUTANEOUS | Status: DC

## 2022-02-14 NOTE — Progress Notes (Signed)
Physical Therapy Treatment Patient Details Name: Ann [name] MRN: [id number] DOB: Oct 18, 1950 Today's Date: 02/14/2022   History of Present Illness Ann [name] is a pleasant 71 y.o. female who presented 02/01/22 due to fatigue and missed 2 weeks of HD due to inability to utilize transport due to bed bugs. Pt required found to be anemic and required 2 units of PRBC.   PMH: CAD with PCI to RCA in 2010, DVT, hypertension, type 2 diabetes mellitus, OSA not on CPAP, chronic back pain, and ESRD    PT Comments    Treatment session limited due to pt report of back and BLE pain (described as "burning and aching"). Performed gentle manual stretching in supine, however, pt still limited in ambulation distance and reports increase in pain with weightbearing. Pt ambulating ~15 ft with Rollator, leaning onto forearms and requiring frequent pauses. RN notified for pain medication and pt encouraged to continue to mobilize as tolerated. D/c plan remains appropriate.     Recommendations for follow up therapy are one component of a multi-disciplinary discharge planning process, led by the attending physician.  Recommendations may be updated based on patient status, additional functional criteria and insurance authorization.  Follow Up Recommendations  Home health PT     Assistance Recommended at Discharge Intermittent Supervision/Assistance  Patient can return home with the following Assistance with cooking/housework;Direct supervision/assist for financial management;Assist for transportation;Help with stairs or ramp for entrance;Direct supervision/assist for medications management;A little help with walking and/or transfers   Equipment Recommendations  Rollator (4 wheels)    Recommendations for Other Services       Precautions / Restrictions Precautions Precautions: Fall Restrictions Weight Bearing Restrictions: No     Mobility  Bed Mobility Overal bed mobility: Modified Independent              General bed mobility comments: Cues for log roll technique for comfort, use of bed rail, no physical assist required    Transfers Overall transfer level: Needs assistance Equipment used: Rollator (4 wheels) Transfers: Sit to/from Stand Sit to Stand: Supervision                Ambulation/Gait Ambulation/Gait assistance: Min guard Gait Distance (Feet): 15 Feet Assistive device: Rollator (4 wheels) Gait Pattern/deviations: Step-through pattern, Decreased stride length, Trunk flexed Gait velocity: decreased Gait velocity interpretation: <1.8 ft/sec, indicate of risk for recurrent falls   General Gait Details: Pt leaning onto Rollator on forearms, frequent pauses, ambulating around bed to chair. Increased time, limited in distance due to BLE pain   Stairs             Wheelchair Mobility    Modified Rankin (Stroke Patients Only)       Balance Overall balance assessment: Needs assistance Sitting-balance support: Feet supported, No upper extremity supported Sitting balance-Leahy Scale: Good     Standing balance support: Bilateral upper extremity supported, During functional activity, Single extremity supported Standing balance-Leahy Scale: Poor                              Cognition Arousal/Alertness: Awake/alert Behavior During Therapy: WFL for tasks assessed/performed Overall Cognitive Status: Within Functional Limits for tasks assessed                                          Exercises Other Exercises Other Exercises: Supine: manual  BLE hamstring, piriformis, hip extension stretch x 1 minute each    General Comments        Pertinent Vitals/Pain Pain Assessment Pain Assessment: Faces Faces Pain Scale: Hurts whole lot Pain Location: back, BLE Pain Descriptors / Indicators: Burning, Aching Pain Intervention(s): Limited activity within patient's tolerance, Monitored during session, Patient requesting pain meds-RN  notified    Home Living                          Prior Function            PT Goals (current goals can now be found in the care plan section) Acute Rehab PT Goals Patient Stated Goal: return home Potential to Achieve Goals: Good Progress towards PT goals: Not progressing toward goals - comment (limited by pain)    Frequency    Min 3X/week      PT Plan Current plan remains appropriate    Co-evaluation              AM-PAC PT "6 Clicks" Mobility   Outcome Measure  Help needed turning from your back to your side while in a flat bed without using bedrails?: None Help needed moving from lying on your back to sitting on the side of a flat bed without using bedrails?: None Help needed moving to and from a bed to a chair (including a wheelchair)?: A Little Help needed standing up from a chair using your arms (e.g., wheelchair or bedside chair)?: A Little Help needed to walk in hospital room?: A Little Help needed climbing 3-5 steps with a railing? : A Lot 6 Click Score: 19    End of Session   Activity Tolerance: Patient limited by pain Patient left: in chair;with call bell/phone within reach;with chair alarm set Nurse Communication: Mobility status PT Visit Diagnosis: Unsteadiness on feet (R26.81);Other abnormalities of gait and mobility (R26.89);Muscle weakness (generalized) (M62.81)     Time: 3329-5188 PT Time Calculation (min) (ACUTE ONLY): 17 min  Charges:  $Therapeutic Activity: 8-22 mins                     Wyona Almas, PT, DPT Acute Rehabilitation Services Office [phone]    Deno Etienne 02/14/2022, 9:58 AM

## 2022-02-14 NOTE — TOC Progression Note (Addendum)
Transition of Care [facility]) - Progression Note    Patient Details  Name: Ann [name] MRN: [id number] Date of Birth: [birth year]  Transition of Care [facility]) CM/SW Contact  Zenon Mayo, RN Phone Number: 02/14/2022, 12:58 PM  Clinical Narrative:    Per Olivia Mackie the Renal Navigator-- . She  received information from the out-pt HD social worker this morning. she reports that there has to be written proof from the exterminator that there is no bed bug activity observed in the home after extermination. the written proof will need to be received by [facility] in order for them to be able to resume transportation services for pt. Also Claiborne Billings with [employer] will need the same information, before any of the [facility] reps can go in to the home.  This NCM left Claiborne Billings, the housing manager this information, and left phone number for return call.   NCM received call from [place] the housing Freight forwarder, she states she will have to have someone to go out and check on Wed to make sure there are no more bed bugs so patient will not be ready to dc until probably Thursday, NCM informed Dr. Wyline Copas of this information.    Expected Discharge Plan: [place] Barriers to Discharge: Transportation  Expected Discharge Plan and Services Expected Discharge Plan: [place]       Living arrangements for the past 2 months: Single Family Home                                       Social Determinants of Health (SDOH) Interventions    Readmission Risk Interventions     No data to display

## 2022-02-14 NOTE — Progress Notes (Signed)
Occupational Therapy Treatment Patient Details Name: Ann [name] MRN: [id number] DOB: [birth year] Today's Date: 02/14/2022   History of present illness Ann [name] is a pleasant 71 y.o. female who presented 02/01/22 due to fatigue and missed 2 weeks of HD due to inability to utilize transport due to bed bugs. Pt required found to be anemic and required 2 units of PRBC.   PMH: CAD with PCI to RCA in 2010, DVT, hypertension, type 2 diabetes mellitus, OSA not on CPAP, chronic back pain, and ESRD   OT comments  Pt progressing towards goals this session, completing seated ADLs with min guard -minA, supervision for bed mobility and transfers with rollator. Pt demonstrating good safety awareness, locking rollator brakes prior to sitting/standing. Pt reporting "cramping pain" in hands, educated pt on use of warm washcloth or soaking hands in warm water for pain mgmt. Pt presenting with impairments listed below, will follow acutely. Continue to recommend HHOT at d/c.   Recommendations for follow up therapy are one component of a multi-disciplinary discharge planning process, led by the attending physician.  Recommendations may be updated based on patient status, additional functional criteria and insurance authorization.    Follow Up Recommendations  Home health OT    Assistance Recommended at Discharge Intermittent Supervision/Assistance  Patient can return home with the following  A little help with walking and/or transfers;A little help with bathing/dressing/bathroom;Assistance with cooking/housework;Assist for transportation   Equipment Recommendations  None recommended by OT    Recommendations for Other Services      Precautions / Restrictions Precautions Precautions: Fall Restrictions Weight Bearing Restrictions: No       Mobility Bed Mobility Overal bed mobility: Needs Assistance       Supine to sit: Supervision Sit to supine: Supervision        Transfers Overall  transfer level: Needs assistance Equipment used: Rollator (4 wheels) Transfers: Sit to/from Stand Sit to Stand: Supervision                 Balance Overall balance assessment: Needs assistance Sitting-balance support: Feet supported, No upper extremity supported Sitting balance-Leahy Scale: Good Sitting balance - Comments: reaches outside BOS without LOB   Standing balance support: Bilateral upper extremity supported, During functional activity, Single extremity supported Standing balance-Leahy Scale: Poor Standing balance comment: reliant on external support                           ADL either performed or assessed with clinical judgement   ADL Overall ADL's : Needs assistance/impaired     Grooming: Min guard;Sitting Grooming Details (indicate cue type and reason): seated on rollator at sink         Upper Body Dressing : Minimal assistance;Sitting   Lower Body Dressing: Min guard;Sitting/lateral leans Lower Body Dressing Details (indicate cue type and reason): pulling up socks Toilet Transfer: Min guard;Rollator (4 wheels)           Functional mobility during ADLs: Min guard;Rollator (4 wheels)      Extremity/Trunk Assessment Upper Extremity Assessment Upper Extremity Assessment: Generalized weakness   Lower Extremity Assessment Lower Extremity Assessment: Defer to PT evaluation        Vision   Vision Assessment?: No apparent visual deficits   Perception Perception Perception: Not tested   Praxis Praxis Praxis: Not tested    Cognition Arousal/Alertness: Awake/alert Behavior During Therapy: WFL for tasks assessed/performed Overall Cognitive Status: Within Functional Limits for tasks assessed  Exercises      Shoulder Instructions       General Comments VSS on RA    Pertinent Vitals/ Pain       Pain Assessment Pain Assessment: Faces Pain Score: 4  Faces Pain Scale:  Hurts little more Pain Location: bil hands Pain Descriptors / Indicators: Cramping, Discomfort Pain Intervention(s): Limited activity within patient's tolerance, Monitored during session  Home Living                                          Prior Functioning/Environment              Frequency  Min 2X/week        Progress Toward Goals  OT Goals(current goals can now be found in the care plan section)  Progress towards OT goals: Progressing toward goals  Acute Rehab OT Goals Patient Stated Goal: to rest OT Goal Formulation: With patient Time For Goal Achievement: 02/18/22 Potential to Achieve Goals: Good ADL Goals Pt Will Perform Lower Body Bathing: with modified independence;sit to/from stand Pt Will Perform Lower Body Dressing: with modified independence;sit to/from stand Pt Will Transfer to Toilet: with modified independence;ambulating Pt Will Perform Tub/Shower Transfer: with modified independence;ambulating;shower seat  Plan Discharge plan remains appropriate    Co-evaluation                 AM-PAC OT "6 Clicks" Daily Activity     Outcome Measure   Help from another person eating meals?: None Help from another person taking care of personal grooming?: None Help from another person toileting, which includes using toliet, bedpan, or urinal?: A Little Help from another person bathing (including washing, rinsing, drying)?: A Little Help from another person to put on and taking off regular upper body clothing?: None Help from another person to put on and taking off regular lower body clothing?: A Little 6 Click Score: 21    End of Session Equipment Utilized During Treatment: Rollator (4 wheels)  OT Visit Diagnosis: Unsteadiness on feet (R26.81);Other abnormalities of gait and mobility (R26.89);Repeated falls (R29.6);Muscle weakness (generalized) (M62.81)   Activity Tolerance Patient tolerated treatment well   Patient Left in bed;with  call bell/phone within reach;with bed alarm set   Nurse Communication Mobility status        Time: 1610-9604 OT Time Calculation (min): 30 min  Charges: OT General Charges $OT Visit: 1 Visit OT Treatments $Self Care/Home Management : 23-37 mins  Lynnda Child, OTD, OTR/L Acute Rehab [phone] [place] 02/14/2022, 3:17 PM

## 2022-02-14 NOTE — Progress Notes (Signed)
  Progress Note   Patient: Ann [name] [id number]:[id number] DOB: [birth year] DOA: 02/01/2022     13 DOS: the patient was seen and examined on 02/14/2022   Brief hospital course: 71/F with history of ESRD, type 2 diabetes mellitus, CAD, hypertension, chronic anemia, history of DVT on Eliquis, diastolic CHF presented to the ED with fatigue and weakness.  Missed 2 weeks of dialysis on account of bedbugs, transportation service would not take her until bedbugs were exterminated. -In the ED hemoglobin was 7.6, potassium was normal, still makes urine -Transfused 2 units of PRBC -Medically stable after resuming HD, awaiting safe dispo, has no transportation back and forth during hemodialysis, TOC following    Assessment and Plan: Normocytic anemia -Likely primarily secondary to CKD and hemodilution -Transfused 2 units of PRBC earlier this visit, no overt bleeding -Continue EPO with HD, hemoglobin improved and stable -Ambulate, PT OT eval> Home health PT recommended -Discharge planning in progress. Issues with bed bugs at home, hindering transportation to and from dialysis. Per TOC, exterminator scheduled to eradicate bed bugs today. Possible d/c home 10/25 after HD -Hemodynamically stable at this time   ESRD on hemodialysis -Missed 2 weeks of hemodialysis on account of loss of transportation due to bedbug infestation -Social work following -Nephrology following, still recommending HD despite voiding. See nephro note -Patient tells me she has no friends or family that can transport her to hemodialysis, TOC following to assist with HD transportation, bedbug extermination planned for today with anticipated d/c after HD on Wed   History of DVT -Continue Eliquis   Acute on chronic diastolic CHF -Volume managed with HD   CAD -Stable continue metoprolol and Pravachol   Hypertension -Continue Norvasc, hydralazine, Imdur, Lopressor      Subjective: No complaints this AM  Physical Exam: Vitals:    02/13/22 1348 02/13/22 2139 02/14/22 0447 02/14/22 0724  BP:  (!) 126/98 (!) 142/60 (!) 151/65  Pulse:  81 79 83  Resp:  '16 18 20  '$ Temp:  98.2 F (36.8 C) 97.7 F (36.5 C) 98.2 F (36.8 C)  TempSrc:  Oral Oral Oral  SpO2:  100% 100% 100%  Weight: 78.5 kg  78.8 kg   Height:       General exam: Awake, laying in bed, in nad Respiratory system: Normal respiratory effort, no wheezing Cardiovascular system: regular rate, s1, s2 Gastrointestinal system: Soft, nondistended, positive BS Central nervous system: CN2-12 grossly intact, strength intact Extremities: Perfused, no clubbing Skin: Normal skin turgor, no notable skin lesions seen Psychiatry: Mood normal // no visual hallucinations   Data Reviewed:  There are no new results to review at this time.  Family Communication: pt in room, family not at bedside  Disposition: Status is: Inpatient Remains inpatient appropriate because: Severity of illness  Planned Discharge Destination: Home    Author: Marylu Lund, MD 02/14/2022 4:04 PM  For on call review [email or web address].

## 2022-02-14 NOTE — Progress Notes (Signed)
Chart reviewed and appears pt's apartment is supposed to be exterminated today. Contacted out-pt HD clinic social worker to see exactly what will need to be done in order for pt's transportation services to be resumed again. There must be written proof from the exterminator that there is no bed bug activity observed in the home after extermination. This written proof will have to be received by [facility] transportation in order for them to resume pt's transportation to/from HD. This info was provided to attending, nephrologist, RN CM, and CSW via secure chat. Will assist as needed.   Melven Sartorius Renal Navigator [phone]

## 2022-02-14 NOTE — Progress Notes (Signed)
[facility] Progress Note  Subjective:  Last HD on 10/23 with no UF ever charted in spreadsheet but listed as 3.4 kg in a note.  She feels ok today.  Felt a little off after dialysis yesterday and doesn't feel like she had that much fluid on.      Review of systems:   Denies shortness of breath or chest pain  Denies n/v   Vitals:   02/13/22 1348 02/13/22 2139 02/14/22 0447 02/14/22 0724  BP:  (!) 126/98 (!) 142/60 (!) 151/65  Pulse:  81 79 83  Resp:  '16 18 20  '$ Temp:  98.2 F (36.8 C) 97.7 F (36.5 C) 98.2 F (36.8 C)  TempSrc:  Oral Oral Oral  SpO2:  100% 100% 100%  Weight: 78.5 kg  78.8 kg   Height:        Physical Exam:    General adult female in bed in no acute distress HEENT normocephalic atraumatic extraocular movements intact sclera anicteric Neck supple trachea midline Lungs clear to auscultation bilaterally normal work of breathing at rest  Heart S1S2 no rub Abdomen soft nontender nondistended Extremities no edema  Psych normal mood and affect Access RUE AVF bruit and thrill     Home meds include - albuterol, amlodipine 10, apixaban, duloxetine, hydralazine 50 tid, norco prn, isosorbide mononitrate 30, linagliptin, metoprolol 25 qd, pantoprazole, pravastatin, ropinirole, prns/ supps/ vits    OP HD: [facility]WF [facility] 4h  400/ 500  78.6kg  3K/2.5 bath  Hep 2000  RUA AVF - last HD 9/27, post wt 79kg - last Hb 9.7 on 9/27 - mircera 30 q2, last 9/27 - venofer '100mg'$  weekly, last 9/27 - doxercalciferol 2 ug IV once per wk, last 9/27   Assessment/ Plan: Anemia symptomatic - presenting Hb 7's, likely due to CKD. S/p 2 units prbcs.  On aranesp 40 mcg weekly on Fridays.  ESRD - HD MWF.   Note that she does need continued dialysis - has been on x 1 year, she states she voids "a lot, all the time" and creat here was 4 on admission despite having missed HD 2 wks of HD.   24 hr creat clearance only came out at 5 and with albumin of 3.0 cannot come off of HD Will  reduce the UF with next HD treatment HD transportation - have consulted Care Management to look into her bedbug situation. (bedbugs causing transportation company to refuse taking her to/ from outpt HD).  to get bedbug extermination on 10/24, Tuesday-  then pt can go home Wed after HD from a nephrology standpoint HTN -   optimize volume with HD H/o DVT - takes eliquis MBD ckd - phos good- no binders . Cont vdra (ordered weekly with HD per prior regimen) Dispo- Spoke with HD SW and she will follow-up with the transportation company to ensure that treatment is set up again to resume this Friday.  Appreciate her assistance. Per prior charting patient can go home this Wednesday, 10/25 from a strictly nephrology standpoint.  (Again checking with the transportation company to ensure no issues)     Recent Labs  Lab 02/10/22 0045 02/13/22 0906  HGB 11.2* 11.6*  ALBUMIN 3.0* 3.2*  CALCIUM 9.1 9.1  PHOS  --  4.4  CREATININE 4.76* 6.60*  K 4.7 4.9   No results for input(s): "IRON", "TIBC", "FERRITIN" in the last 168 hours.  Inpatient medications:  amLODipine  5 mg Oral q AM   apixaban  5 mg Oral BID  Chlorhexidine Gluconate Cloth  6 each Topical Q0600   darbepoetin (ARANESP) injection - DIALYSIS  40 mcg Intravenous Q Fri-HD   doxercalciferol  2 mcg Intravenous Q Fri-HD   DULoxetine  60 mg Oral q AM   feeding supplement  237 mL Oral BID BM   hydrALAZINE  50 mg Oral TID   hydrocerin   Topical BID   insulin aspart  0-5 Units Subcutaneous QHS   insulin aspart  0-6 Units Subcutaneous TID WC   isosorbide mononitrate  30 mg Oral q AM   lidocaine  1 patch Transdermal Q24H   metoprolol tartrate  25 mg Oral Daily   multivitamin  1 tablet Oral QHS   pantoprazole  20 mg Oral q AM   pravastatin  40 mg Oral q AM   rOPINIRole  0.25 mg Oral QHS   sodium chloride flush  3 mL Intravenous Q12H    sodium chloride     sodium chloride, acetaminophen **OR** acetaminophen, HYDROcodone-acetaminophen,  hydrOXYzine, mouth rinse, sodium chloride flush  Claudia Desanctis, MD 8:57 AM 02/14/2022

## 2022-02-15 DIAGNOSIS — D649 Anemia, unspecified: Secondary | ICD-10-CM | POA: Diagnosis not present

## 2022-02-15 LAB — RENAL FUNCTION PANEL
Albumin: 3.4 g/dL — ABNORMAL LOW (ref 3.5–5.0)
Anion gap: 13 (ref 5–15)
BUN: 62 mg/dL — ABNORMAL HIGH (ref 8–23)
CO2: 28 mmol/L (ref 22–32)
Calcium: 9.3 mg/dL (ref 8.9–10.3)
Chloride: 88 mmol/L — ABNORMAL LOW (ref 98–111)
Creatinine, Ser: 6.33 mg/dL — ABNORMAL HIGH (ref 0.44–1.00)
GFR, Estimated: 7 mL/min — ABNORMAL LOW (ref >60)
Glucose, Bld: 127 mg/dL — ABNORMAL HIGH (ref 70–99)
Phosphorus: 5.4 mg/dL — ABNORMAL HIGH (ref 2.5–4.6)
Potassium: 4.5 mmol/L (ref 3.5–5.1)
Sodium: 129 mmol/L — ABNORMAL LOW (ref 135–145)

## 2022-02-15 LAB — GLUCOSE, CAPILLARY
Glucose-Capillary: 129 mg/dL — ABNORMAL HIGH (ref 70–99)
Glucose-Capillary: 164 mg/dL — ABNORMAL HIGH (ref 70–99)
Glucose-Capillary: 180 mg/dL — ABNORMAL HIGH (ref 70–99)

## 2022-02-15 NOTE — Progress Notes (Addendum)
  Progress Note   Patient: Ann [name] HYI:[id number] DOB: 1950/05/18 DOA: 02/01/2022     14 DOS: the patient was seen and examined on 02/15/2022   Brief hospital course: 71/F with history of ESRD, type 2 diabetes mellitus, CAD, hypertension, chronic anemia, history of DVT on Eliquis, diastolic CHF presented to the ED with fatigue and weakness.  Missed 2 weeks of dialysis on account of bedbugs, transportation service would not take her until bedbugs were exterminated. -In the ED hemoglobin was 7.6, potassium was normal, still makes urine -Transfused 2 units of PRBC -Medically stable after resuming HD, awaiting safe dispo, has no transportation back and forth during hemodialysis, TOC following    Assessment and Plan: Normocytic anemia -Likely primarily secondary to CKD and hemodilution -Transfused 2 units of PRBC earlier --hemoglobin 11 range -Continue EPO with HD -Ambulate, PT OT eval> Home health PT recommended -Discharge planning in progress. Issues with bed bugs at home, hindering transportation to and from dialysis---defer to [facility] as no safe plan at this time and needs proof of extermination -Hemodynamically stable at this time   ESRD on hemodialysis -Missed 2 weeks of hemodialysis on account of loss of transportation due to bedbug infestation -Nephrology following, still recommending HD despite voiding. See nephro note -Patient tells me she has no friends or family that can transport her to hemodialysis, TOC following to assist with HD transportation,    History of DVT -Continue Eliquis 5 twice daily   Acute on chronic diastolic CHF -Volume managed with HD   CAD -Stable continue metoprolol 25 and Pravachol   Hypertension -Continue Norvasc 5, hydralazi 50 3 times daily , Imdur 30 every morning, Lopressor   Depression Continue Cymbalta 60 and hydroxyzine 10 3 times daily as needed      Subjective:  Looks well seen on HD unit no distress  Physical Exam: Vitals:    02/14/22 2034 02/15/22 0640 02/15/22 0852 02/15/22 1143  BP: (!) 145/66 (!) 175/66 (!) 160/67 121/66  Pulse: 77 83 84 74  Resp: '16 18 16 18  '$ Temp: 98 F (36.7 C) (!) 97.5 F (36.4 C)  98 F (36.7 C)  TempSrc: Oral Oral Oral Oral  SpO2: 100% 100% 100% 99%  Weight:      Height:       EOMI NCAT no focal deficit no icterus or pallor chest clear no added sound HD access in right upper arm ROM intact moving all 4 limbs Abdomen soft no rebound S1-S2 no murmur  Data Reviewed:  Sodium 129 BUN/creatinine 62/6.3  Family Communication: pt in room, family not at bedside  Disposition: Status is: Inpatient Remains inpatient appropriate because: Severity of illness  Planned Discharge Destination: Home    Author: Nita Sells, MD 02/15/2022 3:23 PM  For on call review [email or web address].

## 2022-02-15 NOTE — Progress Notes (Signed)
Mobility Specialist Progress Note   02/15/22 1020  Mobility  Activity Dangled on edge of bed;Refused mobility  Level of Assistance Minimal assist, patient does 75% or more  Range of Motion/Exercises Active   Patient received in supine asleep and easy to arouse. Presented with lethargy needing stimuli to keep awake and kept eyes closed for majority of interaction. Was initially agreeable to participate in mobility. Required min A + extra time to get LE's to EOB but refused further movement and would not assist with trunk from sidelying to sit. Declined further mobility stating "I don't want to exercise or get up", also stated that she was in pain when she moved but could not indicate a specific location when asked. Even with max encouragement and extensive education patient would not compromise or come to consensus. Was left in bed with all needs met, call bell in reach.   [place] Yale Golla, [place], [place]  Office: [phone]

## 2022-02-15 NOTE — Progress Notes (Signed)
   02/15/22 2000  Vitals  Temp 98.7 F (37.1 C)  Temp Source Oral  BP (!) 119/55  Pulse Rate 93  ECG Heart Rate 93  Resp 13  Post Treatment  Dialyzer Clearance Lightly streaked  Duration of HD Treatment -hour(s) 3.5 hour(s)  Liters Processed 73.5  Fluid Removed 1500 mL  Tolerated HD Treatment Yes  AVG/AVF Arterial Site Held (minutes) 10 minutes  AVG/AVF Venous Site Held (minutes) 10 minutes   TX fin. W/o difficulty.

## 2022-02-15 NOTE — Progress Notes (Signed)
[facility] Progress Note  Subjective:  Last HD on 10/23 with 3.4 kg UF.  She felt poorly after HD after getting that much fluid off.  We were notified that proof of bedbug extermination (and no observed bedbug activity) needed to be given to transportation company for her to receive services.   She feels ok today.  Family is not able to take her to HD interim per SW as they do not have private vehicle.   Review of systems:    Denies shortness of breath or chest pain  Denies n/v   Vitals:   02/14/22 0724 02/14/22 1703 02/14/22 2034 02/15/22 0640  BP: (!) 151/65 (!) 150/72 (!) 145/66 (!) 175/66  Pulse: 83 85 77 83  Resp: '20  16 18  '$ Temp: 98.2 F (36.8 C)  98 F (36.7 C) (!) 97.5 F (36.4 C)  TempSrc: Oral  Oral Oral  SpO2: 100%  100% 100%  Weight:      Height:        Physical Exam:     General adult female in bed in no acute distress HEENT normocephalic atraumatic extraocular movements intact sclera anicteric Neck supple trachea midline Lungs clear to auscultation bilaterally normal work of breathing at rest  Heart S1S2 no rub Abdomen soft nontender nondistended Extremities no edema  Psych normal mood and affect Neuro - alert and oriented x 3 provides hx and follows commands Access RUE AVF bruit and thrill     Home meds include - albuterol, amlodipine 10, apixaban, duloxetine, hydralazine 50 tid, norco prn, isosorbide mononitrate 30, linagliptin, metoprolol 25 qd, pantoprazole, pravastatin, ropinirole, prns/ supps/ vits    OP HD: [facility] 4h  400/ 500  78.6kg  3K/2.5 bath  Hep 2000  RUA AVF - last HD 9/27, post wt 79kg - last Hb 9.7 on 9/27 - mircera 30 q2, last 9/27 - venofer '100mg'$  weekly, last 9/27 - doxercalciferol 2 ug IV once per wk, last 9/27   Assessment/ Plan: Anemia symptomatic - presenting Hb 7's, likely due to CKD. S/p 2 units prbcs.  On aranesp 40 mcg weekly on Fridays.  ESRD - HD MWF.   Note that she does need continued dialysis - has been  on x 1 year, she states she voids "a lot, all the time" and creat here was 4 on admission despite having missed HD 2 wks of HD.   24 hr creat clearance only came out at 5 and with albumin of 3.0 cannot come off of HD have reduced the UF with next HD treatment HD transportation - have consulted Care Management to look into her bedbug situation. (bedbugs causing transportation company to refuse taking her to/ from outpt HD).  bedbug extermination on 10/24 and she now needs proof of no bedbug activity; appreciate SW.  They have reached out to find out how to get the proof   HTN -   optimize volume with HD H/o DVT - takes eliquis MBD ckd - phos acceptable- no binders . Cont vdra (ordered weekly with HD per prior regimen)  Dispo-  Will need to continue inpatient monitoring until transportation to HD is finalized.  As above, need proof of no bedbug activity.  Spoke with HD SW and she will follow-up on how to get that proof - she hasn't heard back from anyone yet   Recent Labs  Lab 02/10/22 0045 02/13/22 0906 02/15/22 0242  HGB 11.2* 11.6*  --   ALBUMIN 3.0* 3.2* 3.4*  CALCIUM 9.1 9.1  9.3  PHOS  --  4.4 5.4*  CREATININE 4.76* 6.60* 6.33*  K 4.7 4.9 4.5   No results for input(s): "IRON", "TIBC", "FERRITIN" in the last 168 hours.  Inpatient medications:  amLODipine  5 mg Oral q AM   apixaban  5 mg Oral BID   Chlorhexidine Gluconate Cloth  6 each Topical Q0600   darbepoetin (ARANESP) injection - DIALYSIS  40 mcg Intravenous Q Fri-HD   doxercalciferol  2 mcg Intravenous Q Fri-HD   DULoxetine  60 mg Oral q AM   feeding supplement  237 mL Oral BID BM   hydrALAZINE  50 mg Oral TID   hydrocerin   Topical BID   insulin aspart  0-5 Units Subcutaneous QHS   insulin aspart  0-6 Units Subcutaneous TID WC   isosorbide mononitrate  30 mg Oral q AM   lidocaine  1 patch Transdermal Q24H   metoprolol tartrate  25 mg Oral Daily   multivitamin  1 tablet Oral QHS   pantoprazole  20 mg Oral q AM    pravastatin  40 mg Oral q AM   rOPINIRole  0.25 mg Oral QHS   sodium chloride flush  3 mL Intravenous Q12H    sodium chloride     sodium chloride, acetaminophen **OR** acetaminophen, HYDROcodone-acetaminophen, hydrOXYzine, mouth rinse, sodium chloride flush  Claudia Desanctis, MD 8:59 AM 02/15/2022

## 2022-02-15 NOTE — Progress Notes (Signed)
Physical Therapy Treatment Patient Details Name: Ann [name] MRN: [id number] DOB: 04-16-1951 Today's Date: 02/15/2022   History of Present Illness Ann [name] is a pleasant 71 y.o. female who presented 02/01/22 due to fatigue and missed 2 weeks of HD due to inability to utilize transport due to bed bugs. Pt found to be anemic and required 2 units of PRBC.   PMH: CAD with PCI to RCA in 2010, DVT, hypertension, type 2 diabetes mellitus, OSA not on CPAP, chronic back pain, and ESRD    PT Comments    Pt agreeable to get OOB and ambulate within the room, but remains limited by back pain. She was also easily distracted, joking around with her family member often. Thus, pt only ambulated 2x ~10 ft bouts instead of following cues to try to walk towards the door to progress gait. In addition, pt only performed several seated marches before getting distracted or experiencing pain that limited her. Attempted to provide back exercises/stretches as a warm-up to manage her pain, but pt politely declined. Will continue to follow acutely. Current recommendations remain appropriate.      Recommendations for follow up therapy are one component of a multi-disciplinary discharge planning process, led by the attending physician.  Recommendations may be updated based on patient status, additional functional criteria and insurance authorization.  Follow Up Recommendations  Home health PT     Assistance Recommended at Discharge Intermittent Supervision/Assistance  Patient can return home with the following Assistance with cooking/housework;Direct supervision/assist for financial management;Assist for transportation;Help with stairs or ramp for entrance;Direct supervision/assist for medications management;A little help with walking and/or transfers;A little help with bathing/dressing/bathroom   Equipment Recommendations  Rollator (4 wheels)    Recommendations for Other Services       Precautions /  Restrictions Precautions Precautions: Fall Restrictions Weight Bearing Restrictions: No     Mobility  Bed Mobility Overal bed mobility: Modified Independent Bed Mobility: Supine to Sit     Supine to sit: Modified independent (Device/Increase time), HOB elevated     General bed mobility comments: Mod I to transition supine > sit R EOB with HOB elevated    Transfers Overall transfer level: Needs assistance Equipment used: Rolling walker (2 wheels) Transfers: Sit to/from Stand Sit to Stand: Min assist           General transfer comment: MinA to power up to stand and steady with pt demonstrated difficulty due to low back pain. Attempted to have pt stretch pre-mobility, but pt declined.    Ambulation/Gait Ambulation/Gait assistance: Min guard, Min assist Gait Distance (Feet): 10 Feet (x2 bouts of ~10 ft each) Assistive device: Rolling walker (2 wheels) Gait Pattern/deviations: Step-through pattern, Decreased stride length, Trunk flexed Gait velocity: decreased Gait velocity interpretation: <1.31 ft/sec, indicative of household ambulator   General Gait Details: Pt initially leaning forearms onto RW, needing cues to correct for improved upright posture and safety. Min guard-minA for stability due to noted pain impacting her balance.   Stairs             Wheelchair Mobility    Modified Rankin (Stroke Patients Only)       Balance Overall balance assessment: Needs assistance Sitting-balance support: Feet supported, No upper extremity supported Sitting balance-Leahy Scale: Good     Standing balance support: Bilateral upper extremity supported, During functional activity Standing balance-Leahy Scale: Poor Standing balance comment: reliant on RW and up to minA  Cognition Arousal/Alertness: Awake/alert Behavior During Therapy: WFL for tasks assessed/performed Overall Cognitive Status: Within Functional Limits for tasks  assessed                                 General Comments: Pt easily distracted, joking with family member in room        Exercises General Exercises - Lower Extremity Hip Flexion/Marching: AROM, Both, 5 reps, Seated Other Exercises Other Exercises: pt declining attempts to guide pt through stretches    General Comments        Pertinent Vitals/Pain Pain Assessment Pain Assessment: Faces Faces Pain Scale: Hurts even more Pain Location: back Pain Descriptors / Indicators: Aching, Discomfort, Grimacing, Guarding, Moaning Pain Intervention(s): Limited activity within patient's tolerance, Monitored during session, Repositioned    Home Living                          Prior Function            PT Goals (current goals can now be found in the care plan section) Acute Rehab PT Goals Patient Stated Goal: return home PT Goal Formulation: With patient/family Time For Goal Achievement: 02/18/22 Potential to Achieve Goals: Good Progress towards PT goals: Not progressing toward goals - comment (limited by pain)    Frequency    Min 3X/week      PT Plan Current plan remains appropriate    Co-evaluation              AM-PAC PT "6 Clicks" Mobility   Outcome Measure  Help needed turning from your back to your side while in a flat bed without using bedrails?: None Help needed moving from lying on your back to sitting on the side of a flat bed without using bedrails?: None Help needed moving to and from a bed to a chair (including a wheelchair)?: A Little Help needed standing up from a chair using your arms (e.g., wheelchair or bedside chair)?: A Little Help needed to walk in hospital room?: A Lot Help needed climbing 3-5 steps with a railing? : A Lot 6 Click Score: 18    End of Session Equipment Utilized During Treatment: Gait belt Activity Tolerance: Patient limited by pain Patient left: with call bell/phone within reach;in bed;with bed alarm  set;with family/visitor present Nurse Communication: Mobility status PT Visit Diagnosis: Unsteadiness on feet (R26.81);Other abnormalities of gait and mobility (R26.89);Muscle weakness (generalized) (M62.81);Difficulty in walking, not elsewhere classified (R26.2);Pain Pain - part of body:  (back)     Time: 6503-5465 PT Time Calculation (min) (ACUTE ONLY): 27 min  Charges:  $Gait Training: 8-22 mins $Therapeutic Exercise: 8-22 mins                     Moishe Spice, PT, DPT Acute Rehabilitation Services  Office: [phone]    Orvan Falconer 02/15/2022, 5:11 PM

## 2022-02-15 NOTE — TOC Progression Note (Addendum)
Transition of Care [facility]) - Progression Note    Patient Details  Name: Ann [name] MRN: [id number] Date of Birth: 09-Jan-1951  Transition of Care [facility]) CM/SW Contact  Zenon Mayo, RN Phone Number: 02/15/2022, 4:30 PM  Clinical Narrative:    NCM received information , that even though patient 's apartment has been treated with the exterminator the [employer] transport need verification that she is clear of the bed bugs, also the [facility] agency states they need to know if she is clear in order for their reps to go out to work with her. NCM informed supervisors and MD of this information.   Expected Discharge Plan: [place] Barriers to Discharge: Transportation  Expected Discharge Plan and Services Expected Discharge Plan: [place]       Living arrangements for the past 2 months: Single Family Home                                       Social Determinants of Health (SDOH) Interventions    Readmission Risk Interventions     No data to display

## 2022-02-16 DIAGNOSIS — D649 Anemia, unspecified: Secondary | ICD-10-CM | POA: Diagnosis not present

## 2022-02-16 LAB — GLUCOSE, CAPILLARY
Glucose-Capillary: 124 mg/dL — ABNORMAL HIGH (ref 70–99)
Glucose-Capillary: 226 mg/dL — ABNORMAL HIGH (ref 70–99)
Glucose-Capillary: 212 mg/dL — ABNORMAL HIGH (ref 70–99)
Glucose-Capillary: 224 mg/dL — ABNORMAL HIGH (ref 70–99)

## 2022-02-16 MED ORDER — CHLORHEXIDINE GLUCONATE CLOTH 2 % EX PADS
6.0000 | MEDICATED_PAD | Freq: Every day | CUTANEOUS | Status: DC
  Administered 2022-02-18: 6 via TOPICAL

## 2022-02-16 MED ORDER — DULOXETINE HCL 30 MG PO CPEP
30.0000 mg | ORAL_CAPSULE | Freq: Every morning | ORAL | Status: DC
  Administered 2022-02-17 – 2022-02-18 (×2): 30 mg via ORAL
  Filled 2022-02-16 (×2): qty 1

## 2022-02-16 MED ORDER — DARBEPOETIN ALFA 40 MCG/0.4ML IJ SOSY: 40.0000 ug | PREFILLED_SYRINGE | INTRAMUSCULAR | Status: DC

## 2022-02-16 NOTE — TOC Progression Note (Addendum)
Transition of Care [facility]) - Progression Note    Patient Details  Name: Ann [name] MRN: [id number] Date of Birth: 1950/11/05  Transition [facility]) CM/SW Contact  Zenon Mayo, RN Phone Number: 02/16/2022, 12:06 PM  Clinical Narrative:    NCM spoke with Leadership regarding this patient.  If she were to go to a hotel ,she will need someone to stay with her, to get groceries, medicine , etc.  NCM called Felecia her daughter,  she states she will contact her niece who is in school to see if she will be able to stay with her.  NCM received call back from [facility] and she states the niece will be able to  stay with patient at the hotel. She is only in school for two hours and then she is with patient for rest of the day per Felecia.  NCM informed Leadership of this information.  Per Leadership, we will assist this patient to get a hotel and let her grand daughter stay with her.  NCM informed the grand daughter that she can not bring anything from the apartment to the hotel. She states she understands. Edwyna Ready will give this NCM the address of the hotel and we will plan for this patient to dc on Monday after HD.  Alma daughter will need some bus passes also so she can be able to go and get groceries, medicine etc for patient.  But patient wil need cab voucher to get to the hotel at discharge.  Once verification is received from the housing manager exterminator, will need to fax the verification to  [employer] at [phone].     Expected Discharge Plan: [place] Barriers to Discharge: Transportation  Expected Discharge Plan and Services Expected Discharge Plan: [place]       Living arrangements for the past 2 months: Single Family Home                                       Social Determinants of Health (SDOH) Interventions    Readmission Risk Interventions     No data to display

## 2022-02-16 NOTE — Progress Notes (Signed)
Mobility Specialist Progress Note    02/16/22 1234  Mobility  Activity Ambulated with assistance in room  Level of Assistance Contact guard assist, steadying assist  Assistive Device Front wheel walker  Distance Ambulated (ft) 40 ft  Activity Response Tolerated fair  Mobility Referral Yes  $Mobility charge 1 Mobility   Pre-Mobility: 79 HR During Mobility: 98 HR Post-Mobility: 82 HR  Pt received in bed and agreeable. Took an extended time to sit EOB then rested for ~10 minutes. C/o 3/10 back pain. RN notified and provided medicine. Returned to chair with call bell in reach.    Ann [name] Mobility Specialist  Secure Chat Only

## 2022-02-16 NOTE — Progress Notes (Signed)
[facility] Progress Note  Subjective:  Last HD on 10/25 with 1.5 kg UF.   She hasn't heard about final proof that there is no bedbug activity - I reached out to HD SW and she is following up on getting that proof.    Review of systems:     Denies shortness of breath or chest pain  Denies n/v   Vitals:   02/15/22 2032 02/15/22 2142 02/16/22 0549 02/16/22 0816  BP: (!) 141/66 129/62 125/60 (!) 116/58  Pulse: (!) 101 94 89 92  Resp: '18 18 20 18  '$ Temp: 98.3 F (36.8 C) 98.1 F (36.7 C) 98 F (36.7 C) 98.6 F (37 C)  TempSrc: Oral Oral Oral Oral  SpO2: 100% 100% 100% 100%  Weight:   68.7 kg   Height:        Physical Exam:      General adult female in bed in no acute distress HEENT normocephalic atraumatic extraocular movements intact sclera anicteric Neck supple trachea midline Lungs clear to auscultation bilaterally normal work of breathing at rest  Heart S1S2 no rub Abdomen soft nontender nondistended Extremities no edema  Psych normal mood and affect Neuro - alert and oriented x 3 provides hx and follows commands Access RUE AVF bruit and thrill     Home meds include - albuterol, amlodipine 10, apixaban, duloxetine, hydralazine 50 tid, norco prn, isosorbide mononitrate 30, linagliptin, metoprolol 25 qd, pantoprazole, pravastatin, ropinirole, prns/ supps/ vits    OP HD: MWF [place] 4h  400/ 500  78.6kg  3K/2.5 bath  Hep 2000  RUA AVF - last HD 9/27, post wt 79kg - last Hb 9.7 on 9/27 - mircera 30 q2, last 9/27 - venofer '100mg'$  weekly, last 9/27 - doxercalciferol 2 ug IV once per wk, last 9/27   Assessment/ Plan: Anemia symptomatic - presenting Hb 7's, likely due to CKD. S/p 2 units prbcs.  On aranesp 40 mcg weekly on Fridays.  ESRD - HD MWF.   Note that she does need continued dialysis - has been on x 1 year, she states she voids "a lot, all the time" and creat here was 4 on admission despite having missed HD 2 wks of HD.   24 hr creat clearance only came  out at 5 and with albumin of 3.0 cannot come off of HD Appears cymbalta is her established regimen - she will need to speak with her prescribing provider about an alternative as cymbalta not recommended for use in ESRD patients  Renal panel in am HD transportation - have consulted Care Management to look into her bedbug situation. (bedbugs causing transportation company to refuse taking her to/ from outpt HD).  bedbug extermination on 10/24 and she now needs proof of no bedbug activity; appreciate SW.  They have reached out to find out how to get the proof   HTN -   controlled; optimize volume with HD H/o DVT - takes eliquis MBD ckd - phos acceptable- no binders . Cont vdra (ordered weekly with HD per prior regimen)  Dispo-  Will need to continue inpatient monitoring until transportation to HD is finalized.  As above, we need proof of no bedbug activity in her home before her transportation company (and her home health company) will work with her.  Spoke with HD SW and she will follow-up on getting proof - she hasn't heard back yet about this    Recent Labs  Lab 02/10/22 0045 02/13/22 0906 02/15/22 0242  HGB 11.2* 11.6*  --  ALBUMIN 3.0* 3.2* 3.4*  CALCIUM 9.1 9.1 9.3  PHOS  --  4.4 5.4*  CREATININE 4.76* 6.60* 6.33*  K 4.7 4.9 4.5   No results for input(s): "IRON", "TIBC", "FERRITIN" in the last 168 hours.  Inpatient medications:  amLODipine  5 mg Oral q AM   apixaban  5 mg Oral BID   Chlorhexidine Gluconate Cloth  6 each Topical Q0600   darbepoetin (ARANESP) injection - DIALYSIS  40 mcg Intravenous Q Fri-HD   doxercalciferol  2 mcg Intravenous Q Fri-HD   DULoxetine  60 mg Oral q AM   feeding supplement  237 mL Oral BID BM   hydrALAZINE  50 mg Oral TID   hydrocerin   Topical BID   insulin aspart  0-5 Units Subcutaneous QHS   insulin aspart  0-6 Units Subcutaneous TID WC   isosorbide mononitrate  30 mg Oral q AM   lidocaine  1 patch Transdermal Q24H   metoprolol tartrate  25  mg Oral Daily   multivitamin  1 tablet Oral QHS   pantoprazole  20 mg Oral q AM   pravastatin  40 mg Oral q AM   rOPINIRole  0.25 mg Oral QHS   sodium chloride flush  3 mL Intravenous Q12H    sodium chloride     sodium chloride, acetaminophen **OR** acetaminophen, HYDROcodone-acetaminophen, hydrOXYzine, mouth rinse, sodium chloride flush  Claudia Desanctis, MD 02/16/2022 9:05 AM

## 2022-02-16 NOTE — Progress Notes (Signed)
Occupational Therapy Treatment Patient Details Name: Ann [name] MRN: [id number] DOB: [birth year] Today's Date: 02/16/2022   History of present illness Ann [name] is a pleasant 71 y.o. female who presented 02/01/22 due to fatigue and missed 2 weeks of HD due to inability to utilize transport due to bed bugs. Pt found to be anemic and required 2 units of PRBC.   PMH: CAD with PCI to RCA in 2010, DVT, hypertension, type 2 diabetes mellitus, OSA not on CPAP, chronic back pain, and ESRD   OT comments  This 71 yo female seen today for Bil UE theraband exercises at bed level with levels 2-4 theraband. Pt able to return demonstrate exercises to me. Pt will continue to benefit from acute OT with follow up [place].   Recommendations for follow up therapy are one component of a multi-disciplinary discharge planning process, led by the attending physician.  Recommendations may be updated based on patient status, additional functional criteria and insurance authorization.    Follow Up Recommendations  Home health OT    Assistance Recommended at Discharge Intermittent Supervision/Assistance  Patient can return home with the following  A little help with walking and/or transfers;A little help with bathing/dressing/bathroom;Assistance with cooking/housework;Assist for transportation         Precautions / Restrictions Precautions Precautions: Fall Restrictions Weight Bearing Restrictions: No                 Extremity/Trunk Assessment Upper Extremity Assessment Upper Extremity Assessment: Generalized weakness            Vision Baseline Vision/History: 1 Wears glasses Patient Visual Report: No change from baseline            Cognition Arousal/Alertness: Awake/alert Behavior During Therapy: WFL for tasks assessed/performed Overall Cognitive Status: Within Functional Limits for tasks assessed                                          Exercises Other  Exercises Other Exercises: Educated pt on theraband exercises at bed level. She requested all levels of theraband so she could progress herself at home. Pt completed 5 reps of each exercise            Pertinent Vitals/ Pain       Pain Assessment Pain Assessment: Faces Faces Pain Scale: Hurts little more Pain Location: back when moving around in bed Pain Descriptors / Indicators: Aching, Grimacing, Guarding, Moaning, Sore Pain Intervention(s): Limited activity within patient's tolerance, Monitored during session, Repositioned         Frequency  Min 2X/week        Progress Toward Goals  OT Goals(current goals can now be found in the care plan section)  Progress towards OT goals: Progressing toward goals  Acute Rehab OT Goals Patient Stated Goal: for back to feel better OT Goal Formulation: With patient Time For Goal Achievement: 02/18/22 Potential to Achieve Goals: Good  Plan Discharge plan remains appropriate       AM-PAC OT "6 Clicks" Daily Activity     Outcome Measure   Help from another person eating meals?: None Help from another person taking care of personal grooming?: None Help from another person toileting, which includes using toliet, bedpan, or urinal?: A Little Help from another person bathing (including washing, rinsing, drying)?: A Little Help from another person to put on and taking off regular upper body clothing?: A Little Help  from another person to put on and taking off regular lower body clothing?: A Little 6 Click Score: 20    End of Session    OT Visit Diagnosis: Repeated falls (R29.6);Muscle weakness (generalized) (M62.81)   Activity Tolerance Patient tolerated treatment well   Patient Left in bed;with call bell/phone within reach;with bed alarm set           Time: 1140-1202 OT Time Calculation (min): 22 min  Charges: OT General Charges $OT Visit: 1 Visit OT Treatments $Therapeutic Exercise: 8-22 mins  [address],  OTR/L Acute Rehab Services [facility] [place] [phone] Office [phone]   Almon Register 02/16/2022, 1:30 PM

## 2022-02-16 NOTE — Progress Notes (Signed)
Physical Therapy Treatment Patient Details Name: KAMALJIT HIZER MRN: [id number] DOB: [birth year] Today's Date: 02/16/2022   History of Present Illness Ann [name] is a pleasant 71 y.o. female who presented 02/01/22 due to fatigue and missed 2 weeks of HD due to inability to utilize transport due to bed bugs. Pt found to be anemic and required 2 units of PRBC.   PMH: CAD with PCI to RCA in 2010, DVT, hypertension, type 2 diabetes mellitus, OSA not on CPAP, chronic back pain, and ESRD    PT Comments    Pt is demonstrating improved low back pain, especially after gentle soft tissue mobilization and guiding stretching to her low back and piriformis muscles prior to mobilizing. This improved pain level appears to have allowed for her to maintain an improved upright posture and tolerate ambulating further today. She was able to ambulate up to ~65 ft at a min guard assist level using a RW. Will continue to follow acutely. Current recommendations remain appropriate.    Recommendations for follow up therapy are one component of a multi-disciplinary discharge planning process, led by the attending physician.  Recommendations may be updated based on patient status, additional functional criteria and insurance authorization.  Follow Up Recommendations  Home health PT     Assistance Recommended at Discharge Intermittent Supervision/Assistance  Patient can return home with the following Assistance with cooking/housework;Direct supervision/assist for financial management;Assist for transportation;Help with stairs or ramp for entrance;Direct supervision/assist for medications management;A little help with walking and/or transfers;A little help with bathing/dressing/bathroom   Equipment Recommendations  Rollator (4 wheels)    Recommendations for Other Services       Precautions / Restrictions Precautions Precautions: Fall Restrictions Weight Bearing Restrictions: No     Mobility  Bed  Mobility Overal bed mobility: Modified Independent Bed Mobility: Supine to Sit, Sit to Supine, Rolling Rolling: Modified independent (Device/Increase time)   Supine to sit: Modified independent (Device/Increase time), HOB elevated Sit to supine: Modified independent (Device/Increase time), HOB elevated   General bed mobility comments: Mod I for all bed mobility, needing extra time due to back pain. Able to roll prone <> supine also.    Transfers Overall transfer level: Needs assistance Equipment used: Rolling walker (2 wheels) Transfers: Sit to/from Stand Sit to Stand: Min guard           General transfer comment: Min guard assist for safety, no LOB    Ambulation/Gait Ambulation/Gait assistance: Min guard Gait Distance (Feet): 65 Feet Assistive device: Rolling walker (2 wheels) Gait Pattern/deviations: Step-through pattern, Decreased stride length, Trunk flexed Gait velocity: decreased Gait velocity interpretation: <1.31 ft/sec, indicative of household ambulator   General Gait Details: Pt with improved upright posture, but still flexed at trunk. Pt also with improved gait stability and speed, only needing min guard for safety with no noted LOB   Stairs             Wheelchair Mobility    Modified Rankin (Stroke Patients Only)       Balance Overall balance assessment: Needs assistance Sitting-balance support: Feet supported, No upper extremity supported Sitting balance-Leahy Scale: Good     Standing balance support: Bilateral upper extremity supported, During functional activity Standing balance-Leahy Scale: Poor Standing balance comment: reliant on RW                            Cognition Arousal/Alertness: Awake/alert Behavior During Therapy: WFL for tasks assessed/performed Overall Cognitive Status: Within  Functional Limits for tasks assessed                                 General Comments: Tangential at times, needing  redirecting to remain on task        Exercises Other Exercises Other Exercises: gentle soft tissue mobilization to bil lower back and piriformis muscles along with SIJ to reduce pain, pt reporting success with reduction in pain afterwards Other Exercises: guided pt through gentle low back stretches and piriformis stretches while sitting EOB    General Comments        Pertinent Vitals/Pain Pain Assessment Pain Assessment: Faces Faces Pain Scale: Hurts little more Pain Location: back Pain Descriptors / Indicators: Aching, Discomfort, Grimacing, Guarding, Moaning Pain Intervention(s): Limited activity within patient's tolerance, Monitored during session, Repositioned    Home Living                          Prior Function            PT Goals (current goals can now be found in the care plan section) Acute Rehab PT Goals Patient Stated Goal: return home PT Goal Formulation: With patient Time For Goal Achievement: 02/18/22 Potential to Achieve Goals: Good Progress towards PT goals: Progressing toward goals    Frequency    Min 3X/week      PT Plan Current plan remains appropriate    Co-evaluation              AM-PAC PT "6 Clicks" Mobility   Outcome Measure  Help needed turning from your back to your side while in a flat bed without using bedrails?: None Help needed moving from lying on your back to sitting on the side of a flat bed without using bedrails?: None Help needed moving to and from a bed to a chair (including a wheelchair)?: A Little Help needed standing up from a chair using your arms (e.g., wheelchair or bedside chair)?: A Little Help needed to walk in hospital room?: A Little Help needed climbing 3-5 steps with a railing? : A Lot 6 Click Score: 19    End of Session   Activity Tolerance: Patient tolerated treatment well Patient left: with call bell/phone within reach;in bed;with bed alarm set   PT Visit Diagnosis: Unsteadiness on  feet (R26.81);Other abnormalities of gait and mobility (R26.89);Muscle weakness (generalized) (M62.81);Difficulty in walking, not elsewhere classified (R26.2);Pain Pain - part of body:  (back)     Time: 4166-0630 PT Time Calculation (min) (ACUTE ONLY): 20 min  Charges:  $Therapeutic Activity: 8-22 mins                     Moishe Spice, PT, DPT Acute Rehabilitation Services  Office: [phone]    Orvan Falconer 02/16/2022, 5:19 PM

## 2022-02-16 NOTE — Progress Notes (Signed)
Nutrition Follow-up  DOCUMENTATION CODES:   Not applicable  INTERVENTION:  Encourage adequate PO intake Continue Ensure Enlive po BID, each supplement provides 350 kcal and 20 grams of protein. Renal MVI with minerals daily  NUTRITION DIAGNOSIS:   Increased nutrient needs related to acute illness as evidenced by estimated needs.  ongoing  GOAL:   Patient will meet greater than or equal to 90% of their needs  Addressing via meals and nutrition supplements  MONITOR:   PO intake, Supplement acceptance, Labs, Weight trends  REASON FOR ASSESSMENT:   Consult, Malnutrition Screening Tool Other (Comment) (nutrition goals)  ASSESSMENT:   Pt admitted with fatigue and weakness r/t symptomatic anemia. Pt missed 2 weeks of HD d/t bedbugs leading to inability to utilize transportation services. PMH significant for ESRD on HD, T2DM, CAD, HTN, chronic anemia, DVT on eliquis, diastolic CHF.  Pt laying on bed sleeping at time of visit, she awoke to her name but then fell back asleep during assessment. She states that she is eating well and continues to drink Ensure.   Based on documentation, she appears to be eating less than she had been during earlier part of admission. She was previously eating 100%.   Meal completions: 10/22: 50% lunch, 50% dinner 10/24: 60% breakfast, 85% lunch 10/25: 85% breakfast  Post HD net UF 1.5L (10/25) Post HD weight: 77.3 kg Pt's weight is -17 kg since admission  Medications: hectorol (every Friday), SSI 0-5 units qhs, SSI 0-6 units TID, rena-vit, protonix  Labs: sodium 129, BUN 62, Cr 6.33, phos 5.4, GFR 7, CBG's 129-226 x24 hours  UOP: 127m x12 hours  NUTRITION - FOCUSED PHYSICAL EXAM:  Flowsheet Row Most Recent Value  Orbital Region No depletion  Upper Arm Region Mild depletion  Thoracic and Lumbar Region No depletion  Buccal Region No depletion  Temple Region No depletion  Clavicle Bone Region No depletion  Clavicle and Acromion Bone  Region No depletion  Scapular Bone Region No depletion  Dorsal Hand Mild depletion  Patellar Region Mild depletion  Anterior Thigh Region Mild depletion  Posterior Calf Region No depletion  Edema (RD Assessment) None  Hair Reviewed  Eyes Unable to assess  Mouth Unable to assess  Skin Reviewed  Nails Reviewed       Diet Order:   Diet Order             Diet Carb Modified Fluid consistency: Thin; Room service appropriate? Yes  Diet effective now                   EDUCATION NEEDS:   No education needs have been identified at this time  Skin:  Skin Assessment: Reviewed RN Assessment  Last BM:  10/18  Height:   Ht Readings from Last 1 Encounters:  02/03/22 '5\' 2"'$  (1.575 m)    Weight:   Wt Readings from Last 1 Encounters:  02/16/22 68.7 kg    Ideal Body Weight:  50 kg  BMI:  Body mass index is 27.71 kg/m.  Estimated Nutritional Needs:   Kcal:  1400-1600  Protein:  70-85g  Fluid:  1L + UOP  AClayborne Dana RDN, LDN Clinical Nutrition

## 2022-02-16 NOTE — Progress Notes (Addendum)
  Progress Note   Patient: Ann [name] [id number]:[id number] DOB: 05/10/1950 DOA: 02/01/2022     15 DOS: the patient was seen and examined on 02/16/2022   Brief hospital course: 71/F with history of ESRD, type 2 diabetes mellitus, CAD, hypertension, chronic anemia, history of DVT on Eliquis, diastolic CHF presented to the ED with fatigue and weakness.  Missed 2 weeks of dialysis on account of bedbugs, transportation service would not take her until bedbugs were exterminated. -In the ED hemoglobin was 7.6, potassium was normal, still makes urine -Transfused 2 units of PRBC -Medically stable after resuming HD, awaiting safe dispo, has no transportation back and forth during hemodialysis, TOC following    Assessment and Plan: Normocytic anemia -Likely primarily secondary to CKD and hemodilution -Transfused 2 units of PRBC earlier --hemoglobin 11 range -Continue EPO with HD -Ambulate, PT OT eval> Home health PT recommended  ESRD on hemodialysis -Missed 2 weeks of hemodialysis on account of loss of transportation due to bedbug infestation -Nephrology following, recommending HD given elevated albumin/Creat clearance -no friends or family that can transport her to hemodialysis, TOC following to assist with HD transportation,    History of DVT -Continue Eliquis 5 twice daily   Acute on chronic diastolic CHF -Volume managed with HD   CAD -Stable continue metoprolol 25 and Pravachol   Hypertension -Continue Norvasc 5, hydralazi 50 3 times daily , Imdur 30 every morning, Lopressor   Depression on Cymbalta 60 and hydroxyzine 10 3 times daily as needed Cymbalta shouldn't be used in HD patient--will taper to 30 mg for  1 week of taper and initiate prozac 10 mg on d/c      Subjective:  Well sitting up some LBP no other issue Seems confortable  Physical Exam: Vitals:   02/16/22 0816 02/16/22 0845 02/16/22 0900 02/16/22 1100  BP: (!) 116/58 (!) 133/50 (!) 157/67 (!) 121/58  Pulse: 92 94  90 77  Resp: '18 18  16  '$ Temp: 98.6 F (37 C) 98.8 F (37.1 C)  98.5 F (36.9 C)  TempSrc: Oral Oral  Oral  SpO2: 100% 98%  98%  Weight:      Height:       EOMI NCAT no added sound HD access in right upper arm ROM intact moving all 4 limbs Abdomen soft no rebound S1-S2 no murmur  Data Reviewed:  No labs today  Family Communication: pt in room, family not at bedside  Disposition: Status is: Inpatient Remains inpatient appropriate because: -Discharge planning in progress. Issues with bed bugs at home, hindering transportation and HD -[facility] leadership searching for altenratives to home until bed-bug sitaition cleared  Planned Discharge Destination: Home    Author: Nita Sells, MD 02/16/2022 3:00 PM  For on call review [email or web address].

## 2022-02-17 DIAGNOSIS — D649 Anemia, unspecified: Secondary | ICD-10-CM | POA: Diagnosis not present

## 2022-02-17 LAB — RENAL FUNCTION PANEL
Albumin: 3.3 g/dL — ABNORMAL LOW (ref 3.5–5.0)
Anion gap: 11 (ref 5–15)
BUN: 57 mg/dL — ABNORMAL HIGH (ref 8–23)
CO2: 29 mmol/L (ref 22–32)
Calcium: 9.1 mg/dL (ref 8.9–10.3)
Chloride: 92 mmol/L — ABNORMAL LOW (ref 98–111)
Creatinine, Ser: 5.91 mg/dL — ABNORMAL HIGH (ref 0.44–1.00)
GFR, Estimated: 7 mL/min — ABNORMAL LOW (ref >60)
Glucose, Bld: 150 mg/dL — ABNORMAL HIGH (ref 70–99)
Phosphorus: 4.3 mg/dL (ref 2.5–4.6)
Potassium: 4.3 mmol/L (ref 3.5–5.1)
Sodium: 132 mmol/L — ABNORMAL LOW (ref 135–145)

## 2022-02-17 LAB — GLUCOSE, CAPILLARY
Glucose-Capillary: 165 mg/dL — ABNORMAL HIGH (ref 70–99)
Glucose-Capillary: 240 mg/dL — ABNORMAL HIGH (ref 70–99)
Glucose-Capillary: 228 mg/dL — ABNORMAL HIGH (ref 70–99)
Glucose-Capillary: 156 mg/dL — ABNORMAL HIGH (ref 70–99)

## 2022-02-17 LAB — CBC
HCT: 36.8 % (ref 36.0–46.0)
Hemoglobin: 11.8 g/dL — ABNORMAL LOW (ref 12.0–15.0)
MCH: 29.8 pg (ref 26.0–34.0)
MCHC: 32.1 g/dL (ref 30.0–36.0)
MCV: 92.9 fL (ref 80.0–100.0)
Platelets: 194 10*3/uL (ref 150–400)
RBC: 3.96 MIL/uL (ref 3.87–5.11)
RDW: 14.4 % (ref 11.5–15.5)
WBC: 6 10*3/uL (ref 4.0–10.5)
nRBC: 0 % (ref 0.0–0.2)

## 2022-02-17 NOTE — Progress Notes (Signed)
Received patient in bed to unit.  Alert and oriented.  Informed consent signed and in chart.   Treatment initiated: [place] Treatment completed: 1138  Patient tolerated well.  Transported back to the room  Alert, without acute distress.  Hand-off given to patient's nurse.   Access used: Avfistula   Access issues: none  Total UF removed: 1.5L Medication(s) given: Hectorol 37mg Post HD VS: 148/67,92,16,99% Post HD weight: 76.6kg   [facility]

## 2022-02-17 NOTE — Progress Notes (Signed)
[facility] Progress Note  Subjective:  Seen and examined on dialysis.  Blood pressure 131/64 and HR 96.  Tolerating goal.  AVF in use.  Procedure supervised.  We were told the home couldn't be reassessed for a couple of weeks so she is to be discharged to a hotel after HD on Monday (current plan).   Review of systems:      Denies shortness of breath or chest pain  Denies n/v   Vitals:   02/17/22 0820 02/17/22 0900 02/17/22 0930 02/17/22 1000  BP: 129/78 (!) 111/54 135/72 (!) 160/86  Pulse:  90  92  Resp:  14  15  Temp:      TempSrc:      SpO2:  100%  99%  Weight:      Height:        Physical Exam:     General adult female in bed in no acute distress HEENT normocephalic atraumatic extraocular movements intact sclera anicteric Neck supple trachea midline Lungs clear to auscultation bilaterally normal work of breathing at rest  Heart S1S2 no rub Abdomen soft nontender nondistended Extremities no edema  Psych normal mood and affect Neuro - alert and oriented x 3 provides hx and follows commands Access RUE AVF bruit and thrill     Home meds include - albuterol, amlodipine 10, apixaban, duloxetine, hydralazine 50 tid, norco prn, isosorbide mononitrate 30, linagliptin, metoprolol 25 qd, pantoprazole, pravastatin, ropinirole, prns/ supps/ vits    OP HD: [facility]WF [place] 4h  400/ 500  78.6kg  3K/2.5 bath  Hep 2000  RUA AVF - last HD 9/27, post wt 79kg - last Hb 9.7 on 9/27 - mircera 30 q2, last 9/27 - venofer '100mg'$  weekly, last 9/27 - doxercalciferol 2 ug IV once per wk, last 9/27   Assessment/ Plan: Anemia symptomatic - presenting Hb 7's, likely due to CKD. S/p 2 units prbcs.  Pause aranesp for now and reassess trends outpatient ESRD - HD MWF.   Note that she does need continued dialysis - has been on x 1 year, she states she voids "a lot, all the time" and creat here was 4 on admission despite having missed HD 2 wks of HD.   24 hr creat clearance only came out at 5 and  with albumin of 3.0 cannot come off of HD Appears cymbalta is her established regimen - she will need to speak with her prescribing provider about an alternative as cymbalta not recommended for use in ESRD patients  HD transportation - have consulted Care Management to look into her bedbug situation. (bedbugs causing transportation company to refuse taking her to/ from outpt HD).  bedbug extermination on 10/24 and she now needs proof of no bedbug activity; appreciate SW.  They have reached out to find out how to get the proof   HTN -   controlled; optimize volume with HD H/o DVT - takes eliquis MBD ckd - phos acceptable- no binders . Cont vdra (ordered weekly with HD per prior regimen)  Dispo-  Per discussion with SW and CM note, plan is for discharge to hotel after HD on Monday, 10/30.  Will need to continue inpatient monitoring until transportation to HD is finalized and she will need transport from hotel to HD.  As above, we need proof of no bedbug activity in her home before her transportation company (and her home health company) will work with her.  HD SW and CM following closely.    Recent Labs  Lab 02/13/22  1610 02/15/22 0242 02/17/22 0135  HGB 11.6*  --  11.8*  ALBUMIN 3.2* 3.4* 3.3*  CALCIUM 9.1 9.3 9.1  PHOS 4.4 5.4* 4.3  CREATININE 6.60* 6.33* 5.91*  K 4.9 4.5 4.3   No results for input(s): "IRON", "TIBC", "FERRITIN" in the last 168 hours.  Inpatient medications:  amLODipine  5 mg Oral q AM   apixaban  5 mg Oral BID   Chlorhexidine Gluconate Cloth  6 each Topical Q0600   Chlorhexidine Gluconate Cloth  6 each Topical Q0600   doxercalciferol  2 mcg Intravenous Q Fri-HD   DULoxetine  30 mg Oral q AM   feeding supplement  237 mL Oral BID BM   hydrALAZINE  50 mg Oral TID   hydrocerin   Topical BID   insulin aspart  0-5 Units Subcutaneous QHS   insulin aspart  0-6 Units Subcutaneous TID WC   isosorbide mononitrate  30 mg Oral q AM   lidocaine  1 patch Transdermal Q24H    metoprolol tartrate  25 mg Oral Daily   multivitamin  1 tablet Oral QHS   pantoprazole  20 mg Oral q AM   pravastatin  40 mg Oral q AM   rOPINIRole  0.25 mg Oral QHS   sodium chloride flush  3 mL Intravenous Q12H    sodium chloride     sodium chloride, acetaminophen **OR** acetaminophen, HYDROcodone-acetaminophen, hydrOXYzine, mouth rinse, sodium chloride flush  Claudia Desanctis, MD 02/17/2022 10:26 AM

## 2022-02-17 NOTE — TOC Progression Note (Signed)
Transition of Care [facility]) - Progression Note    Patient Details  Name: Ann [name] MRN: [id number] Date of Birth: 04-Jun-1950  [facility] of [facility]) CM/SW Contact  Zenon Mayo, RN Phone Number: 02/17/2022, 4:30 PM  Clinical Narrative:     Edwyna Ready , director of TOC  will be getting this patient a hotel at the extended stay [place] [phone], check in is at 3 pm Saturday 10/28, check out is 11 am 11/11,  they were not sure if they can gurantee a room on the lower level today so Zack will call them tomorrow to see if they have a room on the lower level available, if so  Dr. Verlon Au you can dc her tomorrow.  Debbie I will leave the form on your desk for patient to sign tomorrow.  I have spoken with [employer] and they will pick her up from the hotel on Monday for HD.  Jackelyn Poling will need you to email them her room number when you get it to [email or web address],  she  will need a cab voucher to get to the hotel at discharge also.  Patient is aware of this information.  When Zack calls you Jackelyn Poling to let you know if he was able to get a room on the lower level, please do the following below.    Let Dr. Verlon Au know so he can discharge patient if she gets a room.  Call grand daughter , Corie Chiquito [phone] to make sure she will be here to go to hotel with patient Patient needs to sign form on your  desk  Patient will need  cab voucher- Edwyna Ready has approved for cab voucher for her and granddaughter to get to hotel. Will need to email [employer] her room number so they can pick her up on Monday for dialysis I have notified Claiborne Billings with [place] - but they will need her room number also,  she will have [place], [place].      Expected Discharge Plan: [place] Barriers to Discharge: Transportation  Expected Discharge Plan and Services Expected Discharge Plan: [place]       Living arrangements for the past 2 months:  Single Family Home                                       Social Determinants of Health (SDOH) Interventions    Readmission Risk Interventions     No data to display

## 2022-02-17 NOTE — Progress Notes (Signed)
  Progress Note   Patient: Ann [name] Gagan GEZ:[id number] DOB: 12-07-1950 DOA: 02/01/2022     16 DOS: the patient was seen and examined on 02/17/2022   Brief hospital course: 71/F with history of ESRD, type 2 diabetes mellitus, CAD, hypertension, chronic anemia, history of DVT on Eliquis, diastolic CHF presented to the ED with fatigue and weakness.  Missed 2 weeks of dialysis on account of bedbugs, transportation service would not take her until bedbugs were exterminated. -In the ED hemoglobin was 7.6, potassium was normal, still makes urine -Transfused 2 units of PRBC -Medically stable after resuming HD, awaiting safe dispo, has no transportation back and forth during hemodialysis, TOC following    Assessment and Plan: Normocytic anemia -Likely primarily secondary to CKD and hemodilution -Transfused 2 units of PRBC earlier --hemoglobin 11 range -Continue EPO with HD  Chronic lower back pain Seems to be an chronic issue with positioning does get better/worse-no new issues-continues on Tylenol and Norco 1-2 every 4 as needed -Ambulate, PT OT eval> Home health PT   ESRD on hemodialysis -Missed 2 weeks of hemodialysis on account of loss of transportation due to bedbug infestation -Nephrology following, recommending HD given elevated albumin/Creat clearance -no friends or family that can transport her to hemodialysis, TOC following to assist with HD transportation -see below discussion   History of DVT -Continue Eliquis 5 twice daily   Acute on chronic diastolic CHF -Volume managed with HD  diabetes mellitus on insulin sugars as high as 220 but mostly below 200 only on sliding scale coverage at this time Was on Toujeo 4 units bedtime, Tradjenta 5 AM and can probably resume these in the outpatient setting  CAD -Stable continue metoprolol 25 and Pravachol   Hypertension -Continue Norvasc 5, hydralazine 50 3 times daily , Imdur 30 every morning, Lopressor   Depression on Cymbalta  60 and hydroxyzine 10 3 times daily as needed Cymbalta shouldn't be used in HD patient--will taper to 30 mg for  1 week of taper and initiate prozac 10 mg on d/c      Subjective:   Seen on HD unit Does continue to have back pain no fever chills or other issue a little bit sleepy   Physical Exam: Vitals:   02/17/22 0930 02/17/22 1000 02/17/22 1030 02/17/22 1100  BP: 135/72 (!) 160/86 131/64 138/70  Pulse:  92 95 98  Resp:  '15 10 14  '$ Temp:      TempSrc:      SpO2:  99% 99% 98%  Weight:      Height:       EOMI NCAT no added sound HD access in right upper arm working well ROM intact moving all 4 limbs Abdomen soft no rebound S1-S2 no murmur  Data Reviewed:  Sodium 132 potassium 4.3  Hemoglobin 11.8  Family Communication: pt in room, family not at bedside  Disposition: Status is: Inpatient Remains inpatient appropriate because: -Discharge planning in progress. Issues with bed bugs at home, hindering transportation and HD -[facility] leadership searching for altenratives to home until bed-bug sitaition cleared  Planned Discharge Destination: Home    Author: Nita Sells, MD 02/17/2022 11:30 AM  For on call review [email or web address].

## 2022-02-18 DIAGNOSIS — D649 Anemia, unspecified: Secondary | ICD-10-CM | POA: Diagnosis not present

## 2022-02-18 LAB — HEMOGLOBIN A1C
Hgb A1c MFr Bld: 5.4 % (ref 4.8–5.6)
Mean Plasma Glucose: 108.28 mg/dL

## 2022-02-18 LAB — RENAL FUNCTION PANEL
Albumin: 3.3 g/dL — ABNORMAL LOW (ref 3.5–5.0)
Anion gap: 13 (ref 5–15)
BUN: 35 mg/dL — ABNORMAL HIGH (ref 8–23)
CO2: 30 mmol/L (ref 22–32)
Calcium: 9.3 mg/dL (ref 8.9–10.3)
Chloride: 91 mmol/L — ABNORMAL LOW (ref 98–111)
Creatinine, Ser: 4.24 mg/dL — ABNORMAL HIGH (ref 0.44–1.00)
GFR, Estimated: 11 mL/min — ABNORMAL LOW (ref >60)
Glucose, Bld: 213 mg/dL — ABNORMAL HIGH (ref 70–99)
Phosphorus: 3.3 mg/dL (ref 2.5–4.6)
Potassium: 4.3 mmol/L (ref 3.5–5.1)
Sodium: 134 mmol/L — ABNORMAL LOW (ref 135–145)

## 2022-02-18 LAB — GLUCOSE, CAPILLARY
Glucose-Capillary: 203 mg/dL — ABNORMAL HIGH (ref 70–99)
Glucose-Capillary: 221 mg/dL — ABNORMAL HIGH (ref 70–99)

## 2022-02-18 MED ORDER — AMLODIPINE BESYLATE 5 MG PO TABS: 5.0000 mg | ORAL_TABLET | Freq: Every morning | ORAL | 2 refills | Status: AC

## 2022-02-18 MED ORDER — DULOXETINE HCL 30 MG PO CPEP: 30.0000 mg | ORAL_CAPSULE | Freq: Every morning | ORAL | 1 refills | Status: AC

## 2022-02-18 MED ORDER — METOPROLOL TARTRATE 25 MG PO TABS: 25.0000 mg | ORAL_TABLET | Freq: Every day | ORAL | 2 refills | Status: AC

## 2022-02-18 MED ORDER — HYDROCODONE-ACETAMINOPHEN 5-325 MG PO TABS: 1.0000 | ORAL_TABLET | ORAL | 0 refills | Status: AC | PRN

## 2022-02-18 NOTE — TOC Transition Note (Addendum)
Transition of Care [facility]) - CM/SW Discharge Note   Patient Details  Name: CHERRELL MAYBEE MRN: [id number] Date of Birth: [birth year]  [facility]) CM/SW Contact:  Carles Collet, RN Phone Number: 02/18/2022, 1:27 PM   Clinical Narrative:    Updated from [place] with address for hotel for patient to DC.  Notified attending.  Texted Claiborne Billings w [place] to call granddaughter Monday to get room number for [facility]. [place] [phone]. Provided nurse with cab voucher. Spoke w granddaughter twice today and she was updated that DC order written and she should be on her way to the hospital with her grandmother's walker.  Provided with buss passes, and 1 set of clothes.  Patient understands to email [employer] with her room number. [employer] updated with address of hotel and provided with granddaughter's number as well and instructed to call prior to pick up Monday to confirm room number.    Addendum- received call from [place] at [facility] [phone] stating that she has spoken with granddaughter and she will call her back once they get to hotel with room number.   Final next level of care: [place] Barriers to Discharge: No Barriers Identified   Patient Goals and CMS Choice Patient states their goals for this hospitalization and ongoing recovery are:: to go home      Discharge Placement                       Discharge Plan and Services                DME Arranged: Walker rolling with seat DME Agency: [employer] Date DME Agency Contacted: 02/18/22 Time DME Agency Contacted: [phone] Representative spoke with at DME Agency: Delana Meyer     Date [place]: 02/18/22 Time [place]: 1326 Representative spoke with at [place]: [place]  Social Determinants of Health ([place]) Interventions     Readmission Risk Interventions     No data to display

## 2022-02-18 NOTE — Discharge Summary (Signed)
Physician Discharge Summary  Ann [name] OTL:[id number] DOB: Sep 20, 1950 DOA: 02/01/2022  PCP: Antony Blackbird, MD  Admit date: 02/01/2022 Discharge date: 02/18/2022  Time spent: 36 minutes  Recommendations for Outpatient Follow-up:  Needs CBC renal panel and iron studies as per renal in the outpatient setting Discharging to home with home health as bedbugs at her house and transportation has been arranged by TOC to and from dialysis Patient should be transitioned off of Cymbalta completely in about 2 weeks time on follow-up with PCP and may be can use Prozac 10 mg after this has been tapered  Discharge Diagnoses:  MAIN problem for hospitalization   Symptomatic anemia secondary to anemia of chronic disease normocytic Bedbug infestation at home and house was being cleaned Chronic low back pain--no red flags History of DVT on Eliquis Prior CAD Depression neuropathy  Please see below for itemized issues addressed in [place]- refer to other progress notes for clarity if needed  Discharge Condition: Improved  Diet recommendation: Renal diabetic  Filed Weights   02/17/22 0750 02/17/22 1153 02/18/22 0043  Weight: 78.1 kg 76.6 kg 79 kg    History of present illness:  71/F with history of ESRD, type 2 diabetes mellitus, CAD, hypertension, chronic anemia, history of DVT on Eliquis, diastolic CHF presented to the ED with fatigue and weakness.  Missed 2 weeks of dialysis on account of bedbugs, transportation service would not take her until bedbugs were exterminated. -In the ED hemoglobin was 7.6, potassium was normal, still makes urine -Transfused 2 units of PRBC -Medically stable after resuming HD, awaiting safe dispo, has no transportation back and forth during hemodialysis, TOC following  Hospital Course:  Normocytic anemia -Likely primarily secondary to CKD and hemodilution -Transfused 2 units of PRBC earlier --hemoglobin 11 range and has been stable -Continue EPO with HD    Chronic lower back pain Seems to be an chronic issue with positioning does get better/worse-no new issues-continues on Tylenol and Norco 1-2 every 4 as needed -Ambulate, PT OT eval> Home health PT/OT and have been ordered on discharge   ESRD on hemodialysis -Missed 2 weeks of hemodialysis on account of loss of transportation due to bedbug infestation -Nephrology following, recommending HD given elevated albumin/Creat clearance -no friends or family that can transport her to hemodialysis, TOC following to assist with HD transportation -see below discussion   History of DVT -Continue Eliquis 5 twice daily   Acute on chronic diastolic CHF -Volume managed with HD   diabetes mellitus on insulin sugars predominantly in the 200 range only on sliding scale coverage at this time Resumed Toujeo 4 units bedtime, Tradjenta 5 has been resumed as an outpatient   CAD -Stable continue metoprolol 25 and Pravachol   Hypertension -Continue Norvasc 5 (dropped from 10 daily), hydralazine 50 3 times daily , Imdur 30 every morning, Lopressor was increased this admission to 25 twice daily   Depression on Cymbalta 60 and hydroxyzine 10 3 times daily as needed Cymbalta shouldn't be used in HD patient--will taper to 30 mg for  1 week of taper and initiate prozac 10 mg on d/c  Procedures:   Discharge Exam: Vitals:   02/18/22 0624 02/18/22 1111  BP: (!) 124/57 (!) 136/59  Pulse: 86 74  Resp: 20 18  Temp: 97.7 F (36.5 C)   SpO2: 100% 98%    Subj on day of d/c   Awake alert no distress doing fair No icterus no pallor no nausea no vomiting Chest is clear no added sound  no wheeze no rales no rhonchi Abdomen is soft no rebound Neuro-power intact. Skin--no edema   Discharge Instructions   Discharge Instructions     Diet - low sodium heart healthy   Complete by: As directed    Increase activity slowly   Complete by: As directed       Allergies as of 02/18/2022       Reactions    Penicillins Anaphylaxis   Codeine Nausea And Vomiting   Metronidazole Itching   Latex Itching   Believes it was powder        Medication List     STOP taking these medications    loratadine 10 MG tablet Commonly known as: CLARITIN       TAKE these medications    Accu-Chek FastClix Lancets Misc USE AS DIRECTED TO CHECK BLOOD SUGAR FOUR TIMES A DAY(BEFORE MEALS AND AT BEDTIME).   Accu-Chek SmartView test strip Generic drug: glucose blood CHECK BLOOD SUGAR BEFORE MEALS AND AT BEDTIME   acetaminophen 325 MG tablet Commonly known as: TYLENOL Take 650 mg by mouth 2 (two) times daily as needed (for back pain).   albuterol 108 (90 Base) MCG/ACT inhaler Commonly known as: VENTOLIN HFA Inhale 2 puffs into the lungs every 4 (four) hours as needed for wheezing or shortness of breath.   amLODipine 5 MG tablet Commonly known as: NORVASC Take 1 tablet (5 mg total) by mouth in the morning. Start taking on: February 19, 2022 What changed:  medication strength how much to take   apixaban 5 MG Tabs tablet Commonly known as: ELIQUIS Take 1 tablet (5 mg total) by mouth 2 (two) times daily.   DULoxetine 30 MG capsule Commonly known as: CYMBALTA Take 1 capsule (30 mg total) by mouth in the morning. Start taking on: February 19, 2022 What changed:  medication strength how much to take   hydrALAZINE 50 MG tablet Commonly known as: APRESOLINE Take 1 tablet (50 mg total) by mouth 3 (three) times daily.   HYDROcodone-acetaminophen 5-325 MG tablet Commonly known as: NORCO/VICODIN Take 1 tablet by mouth every 4 (four) hours as needed for moderate pain. What changed: reasons to take this   Insulin Pen Needle 31G X 8 MM Misc Commonly known as: Easy Touch Pen Needles Use to inject insulin as instructed.   isosorbide mononitrate 30 MG 24 hr tablet Commonly known as: IMDUR Take 30 mg by mouth in the morning.   lidocaine-prilocaine cream Commonly known as: EMLA 1 application  daily as needed (port access).   linagliptin 5 MG Tabs tablet Commonly known as: TRADJENTA Take 5 mg by mouth in the morning.   metoprolol tartrate 25 MG tablet Commonly known as: LOPRESSOR Take 1 tablet (25 mg total) by mouth daily. Start taking on: February 19, 2022   pantoprazole 20 MG tablet Commonly known as: PROTONIX Take 1 tablet (20 mg total) by mouth in the morning.   pravastatin 40 MG tablet Commonly known as: PRAVACHOL Take 1 tablet (40 mg total) by mouth in the morning.   rOPINIRole 0.25 MG tablet Commonly known as: REQUIP Take 0.25 mg by mouth daily.   Toujeo SoloStar 300 UNIT/ML Solostar Pen Generic drug: insulin glargine (1 Unit Dial) Inject 4 Units into the skin at bedtime.               Durable Medical Equipment  (From admission, onward)           Start     Ordered   02/18/22 [phone]  For home use only DME 4 wheeled rolling walker with seat  Once       Question:  Patient needs a walker to treat with the following condition  Answer:  Weakness   02/18/22 0910           Allergies  Allergen Reactions   Penicillins Anaphylaxis   Codeine Nausea And Vomiting   Metronidazole Itching   Latex Itching    Believes it was powder    Follow-up Information     [facility] Follow up.   Specialty: [place] Why: Agency will call you to set up apt times Contact information: [address] [place] [address] [phone]                  The results of significant diagnostics from this hospitalization (including imaging, microbiology, ancillary and laboratory) are listed below for reference.    Significant Diagnostic Studies: ECHOCARDIOGRAM COMPLETE  Result Date: 02/03/2022    ECHOCARDIOGRAM REPORT   Patient Name:   RODERICK SWEEZY Mohr Date of Exam: 02/02/2022 Medical Rec #:  [id number]     Height:       62.0 in Accession #:    [id number]    Weight:       174.2 lb Date of Birth:  [birth year]     BSA:          1.803  m Patient Age:    30 years      BP:           173/67 mmHg Patient Gender: F             HR:           69 bpm. Exam Location:  Inpatient Procedure: 2D Echo, Cardiac Doppler and Color Doppler Indications:    CHF- Acute diastolic  History:        Patient has prior history of Echocardiogram examinations, most                 recent 04/04/2020. CAD; Risk Factors:Sleep Apnea, Dyslipidemia                 and Hypertension.  Sonographer:    Jefferey Pica Referring Phys: [place]  1. Left ventricular ejection fraction, by estimation, is 60 to 65%. The left ventricle has normal function. The left ventricle has no regional wall motion abnormalities. There is moderate concentric left ventricular hypertrophy. Left ventricular diastolic parameters are consistent with Grade I diastolic dysfunction (impaired relaxation). Elevated left ventricular end-diastolic pressure.  2. Right ventricular systolic function is normal. The right ventricular size is normal. There is normal pulmonary artery systolic pressure.  3. Left atrial size was severely dilated.  4. The mitral valve is degenerative. Trivial mitral valve regurgitation. No evidence of mitral stenosis.  5. The aortic valve is calcified. There is moderate calcification of the aortic valve. There is moderate thickening of the aortic valve. Aortic valve regurgitation is not visualized. Aortic valve sclerosis/calcification is present, without any evidence of aortic stenosis.  6. Aortic dilatation noted. There is mild dilatation of the ascending aorta, measuring 37 mm.  7. The inferior vena cava is normal in size with greater than 50% respiratory variability, suggesting right atrial pressure of 3 mmHg. FINDINGS  Left Ventricle: Left ventricular ejection fraction, by estimation, is 60 to 65%. The left ventricle has normal function. The left ventricle has no regional wall motion abnormalities. The left ventricular internal cavity size was normal  in size.  There is  moderate concentric left ventricular hypertrophy. Left ventricular diastolic parameters are consistent with Grade I diastolic dysfunction (impaired relaxation). Elevated left ventricular end-diastolic pressure. Right Ventricle: The right ventricular size is normal. No increase in right ventricular wall thickness. Right ventricular systolic function is normal. There is normal pulmonary artery systolic pressure. The tricuspid regurgitant velocity is 2.29 m/s, and  with an assumed right atrial pressure of 3 mmHg, the estimated right ventricular systolic pressure is 27.0 mmHg. Left Atrium: Left atrial size was severely dilated. Right Atrium: Right atrial size was normal in size. Pericardium: There is no evidence of pericardial effusion. Mitral Valve: The mitral valve is degenerative in appearance. There is moderate thickening of the anterior and posterior mitral valve leaflet(s). Trivial mitral valve regurgitation. No evidence of mitral valve stenosis. Tricuspid Valve: The tricuspid valve is normal in structure. Tricuspid valve regurgitation is mild . No evidence of tricuspid stenosis. Aortic Valve: The aortic valve is calcified. There is moderate calcification of the aortic valve. There is moderate thickening of the aortic valve. Aortic valve regurgitation is not visualized. Aortic valve sclerosis/calcification is present, without any  evidence of aortic stenosis. Aortic valve peak gradient measures 12.4 mmHg. Pulmonic Valve: The pulmonic valve was normal in structure. Pulmonic valve regurgitation is mild. No evidence of pulmonic stenosis. Aorta: Aortic dilatation noted. There is mild dilatation of the ascending aorta, measuring 37 mm. Venous: The inferior vena cava is normal in size with greater than 50% respiratory variability, suggesting right atrial pressure of 3 mmHg. IAS/Shunts: No atrial level shunt detected by color flow Doppler.  LEFT VENTRICLE PLAX 2D LVIDd:         3.80 cm   Diastology LVIDs:          2.40 cm   LV e' medial:    3.32 cm/s LV PW:         1.60 cm   LV E/e' medial:  20.8 LV IVS:        1.50 cm   LV e' lateral:   5.85 cm/s LVOT diam:     1.90 cm   LV E/e' lateral: 11.8 LV SV:         83 LV SV Index:   46 LVOT Area:     2.84 cm  RIGHT VENTRICLE             IVC RV Basal diam:  3.40 cm     IVC diam: 1.80 cm RV Mid diam:    3.10 cm RV S prime:     13.80 cm/s TAPSE (M-mode): 2.0 cm LEFT ATRIUM             Index        RIGHT ATRIUM           Index LA diam:        4.10 cm 2.27 cm/m   RA Area:     16.20 cm LA Vol (A2C):   75.9 ml 42.11 ml/m  RA Volume:   44.80 ml  24.85 ml/m LA Vol (A4C):   78.6 ml 43.61 ml/m LA Biplane Vol: 79.6 ml 44.16 ml/m  AORTIC VALVE                 PULMONIC VALVE AV Area (Vmax): 1.93 cm     PV Vmax:       0.84 m/s AV Vmax:        176.00 cm/s  PV Peak grad:  2.8 mmHg AV Peak Grad:  12.4 mmHg LVOT Vmax:      120.00 cm/s LVOT Vmean:     84.700 cm/s LVOT VTI:       0.291 m  AORTA Ao Root diam: 3.30 cm Ao Asc diam:  3.70 cm MITRAL VALVE                TRICUSPID VALVE MV Area (PHT): 2.51 cm     TV Peak grad:   26.6 mmHg MV Decel Time: 302 msec     TV Vmax:        2.58 m/s MV E velocity: 69.00 cm/s   TR Peak grad:   21.0 mmHg MV A velocity: 108.00 cm/s  TR Vmax:        229.00 cm/s MV E/A ratio:  0.64                             SHUNTS                             Systemic VTI:  0.29 m                             Systemic Diam: 1.90 cm Skeet Latch MD Electronically signed by Skeet Latch MD Signature Date/Time: 02/03/2022/5:59:12 AM    Final    DG Chest Portable 1 View  Result Date: 02/01/2022 CLINICAL DATA:  Fatigue EXAM: PORTABLE CHEST 1 VIEW COMPARISON:  Previous studies including the examination of 05/25/2021 FINDINGS: Transverse diameter of heart is slightly increased. Thoracic aorta is tortuous and ectatic. Lung fields are clear of any infiltrates or pulmonary edema. There is no pleural effusion or pneumothorax. IMPRESSION: No active disease. Electronically  Signed   By: Elmer Picker M.D.   On: 02/01/2022 18:52    Microbiology: No results found for this or any previous visit (from the past 240 hour(s)).   Labs: Basic Metabolic Panel: Recent Labs  Lab 02/13/22 0906 02/15/22 0242 02/17/22 0135 02/18/22 0030  NA 130* 129* 132* 134*  K 4.9 4.5 4.3 4.3  CL 95* 88* 92* 91*  CO2 '27 28 29 30  '$ GLUCOSE 170* 127* 150* 213*  BUN 76* 62* 57* 35*  CREATININE 6.60* 6.33* 5.91* 4.24*  CALCIUM 9.1 9.3 9.1 9.3  PHOS 4.4 5.4* 4.3 3.3   Liver Function Tests: Recent Labs  Lab 02/13/22 0906 02/15/22 0242 02/17/22 0135 02/18/22 0030  ALBUMIN 3.2* 3.4* 3.3* 3.3*   No results for input(s): "LIPASE", "AMYLASE" in the last 168 hours. No results for input(s): "AMMONIA" in the last 168 hours. CBC: Recent Labs  Lab 02/13/22 0906 02/17/22 0135  WBC 7.2 6.0  HGB 11.6* 11.8*  HCT 34.8* 36.8  MCV 92.1 92.9  PLT 189 194   Cardiac Enzymes: No results for input(s): "CKTOTAL", "CKMB", "CKMBINDEX", "TROPONINI" in the last 168 hours. BNP: BNP (last 3 results) No results for input(s): "BNP" in the last 8760 hours.  ProBNP (last 3 results) No results for input(s): "PROBNP" in the last 8760 hours.  CBG: Recent Labs  Lab 02/17/22 1412 02/17/22 1655 02/17/22 2119 02/18/22 0629 02/18/22 1135  GLUCAP 240* 228* 156* 203* 221*      Signed:  Nita Sells MD   [facility] 02/18/2022, 12:52 PM

## 2022-02-18 NOTE — Care Management (Addendum)
Discharge plan reviewed with patient and granddaughter   Patient to discharge to   [facility] [place], [place] [address] Phone  [phone]  Patient will check in at the front desk.  Room is reserved under the patient's name. This will be a first floor room. Room number will be determined at time of check in.  Check in is between 3pm and 6pm.   Patient will need to notify [employer] of room number and address for transportation to dialysis Monday.  Email for [employer] is: [email or web address] I have sent email to [employer] to update hotel address and include Granddaughter Chudasama [phone] phone number and requested they also call her to verify room number.   Patient provided with clothes, and 4 bus passes as well at [place].   Carles Collet RN [employer]  [phone]  This note has been printed and provided to patient. Patient instructed to call me if there are any problems getting checked in to hotel.

## 2022-02-20 NOTE — Progress Notes (Signed)
Late Note Entry:  Contacted [place] to provide update regarding pt's d/c this weekend to motel and that pt should resume care today. Clinic provided address and details per RN CM note on Saturday.   Melven Sartorius Renal Navigator [phone]

## 2023-10-07 IMAGING — CR DG LUMBAR SPINE 2-3V
3 series · 3 of 3 positions shown · non-contrast
Comparison: None

CLINICAL DATA: Falls.  Pain.

EXAM:
LUMBAR SPINE - 2-3 VIEW

[l-spine ap]
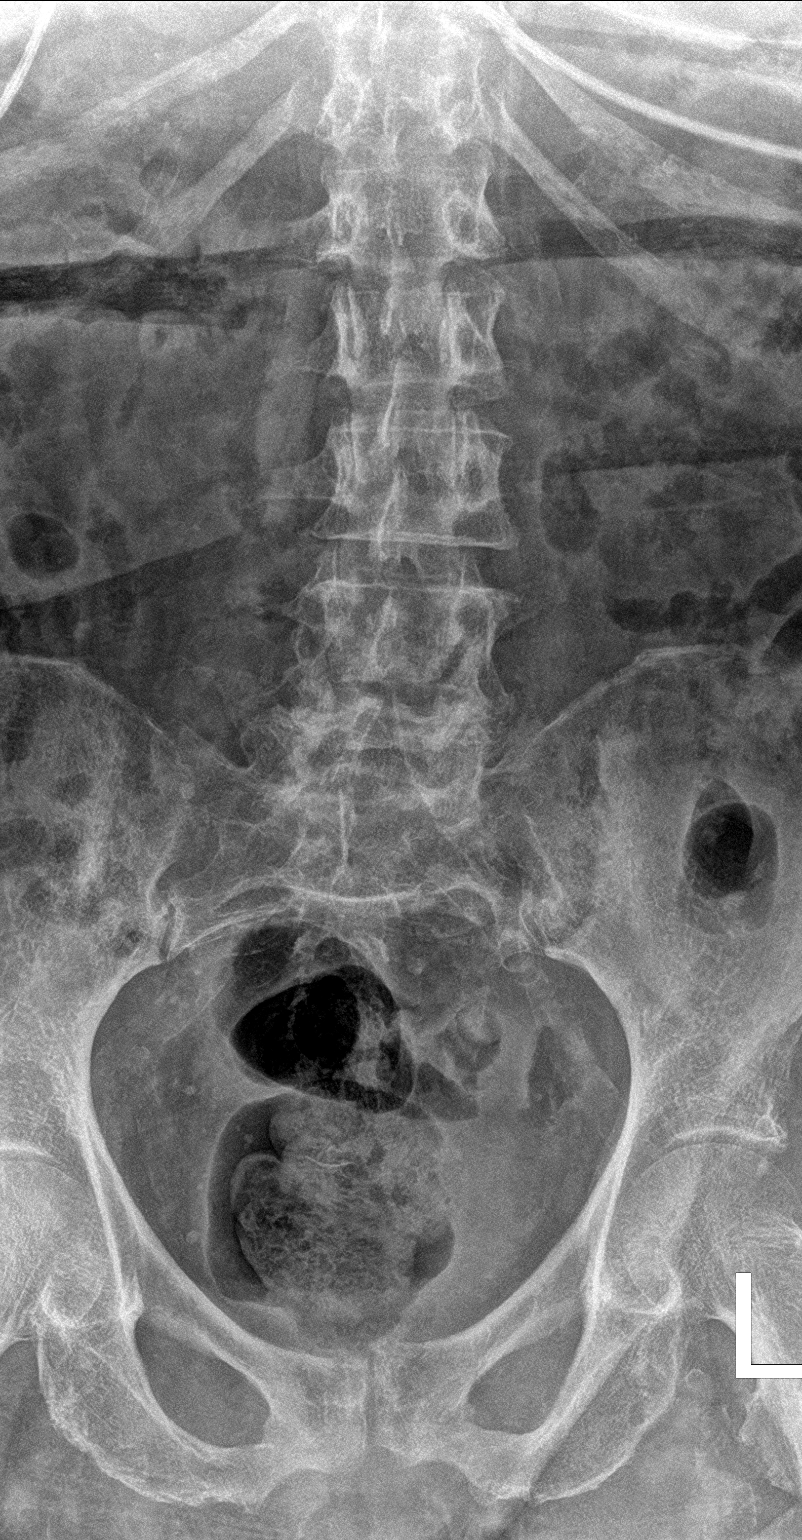

[l-spine lat]
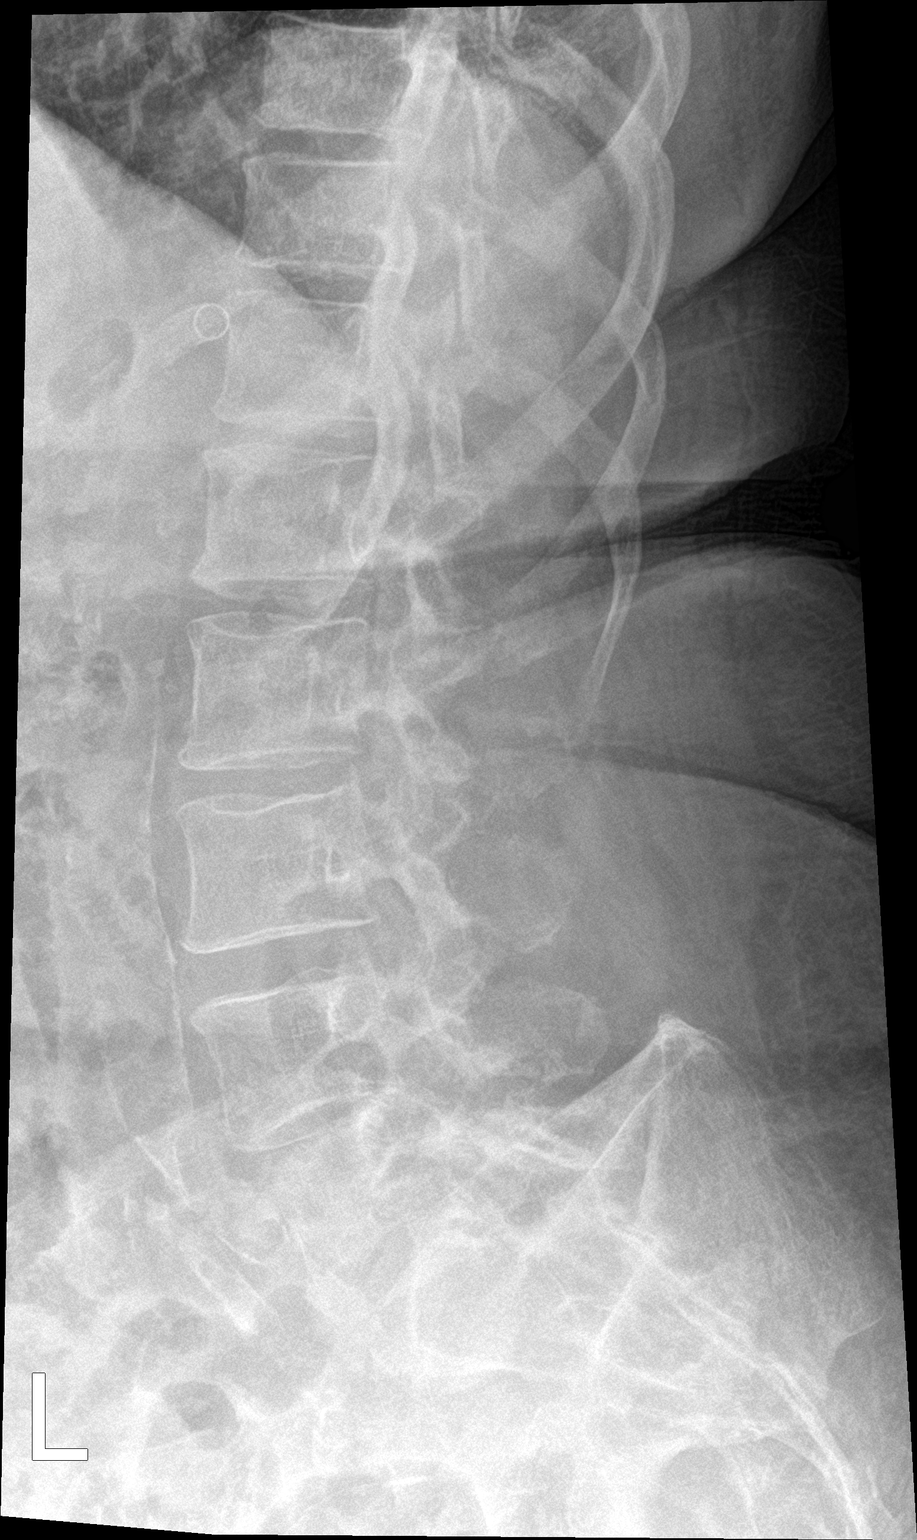

[l-spine spot]
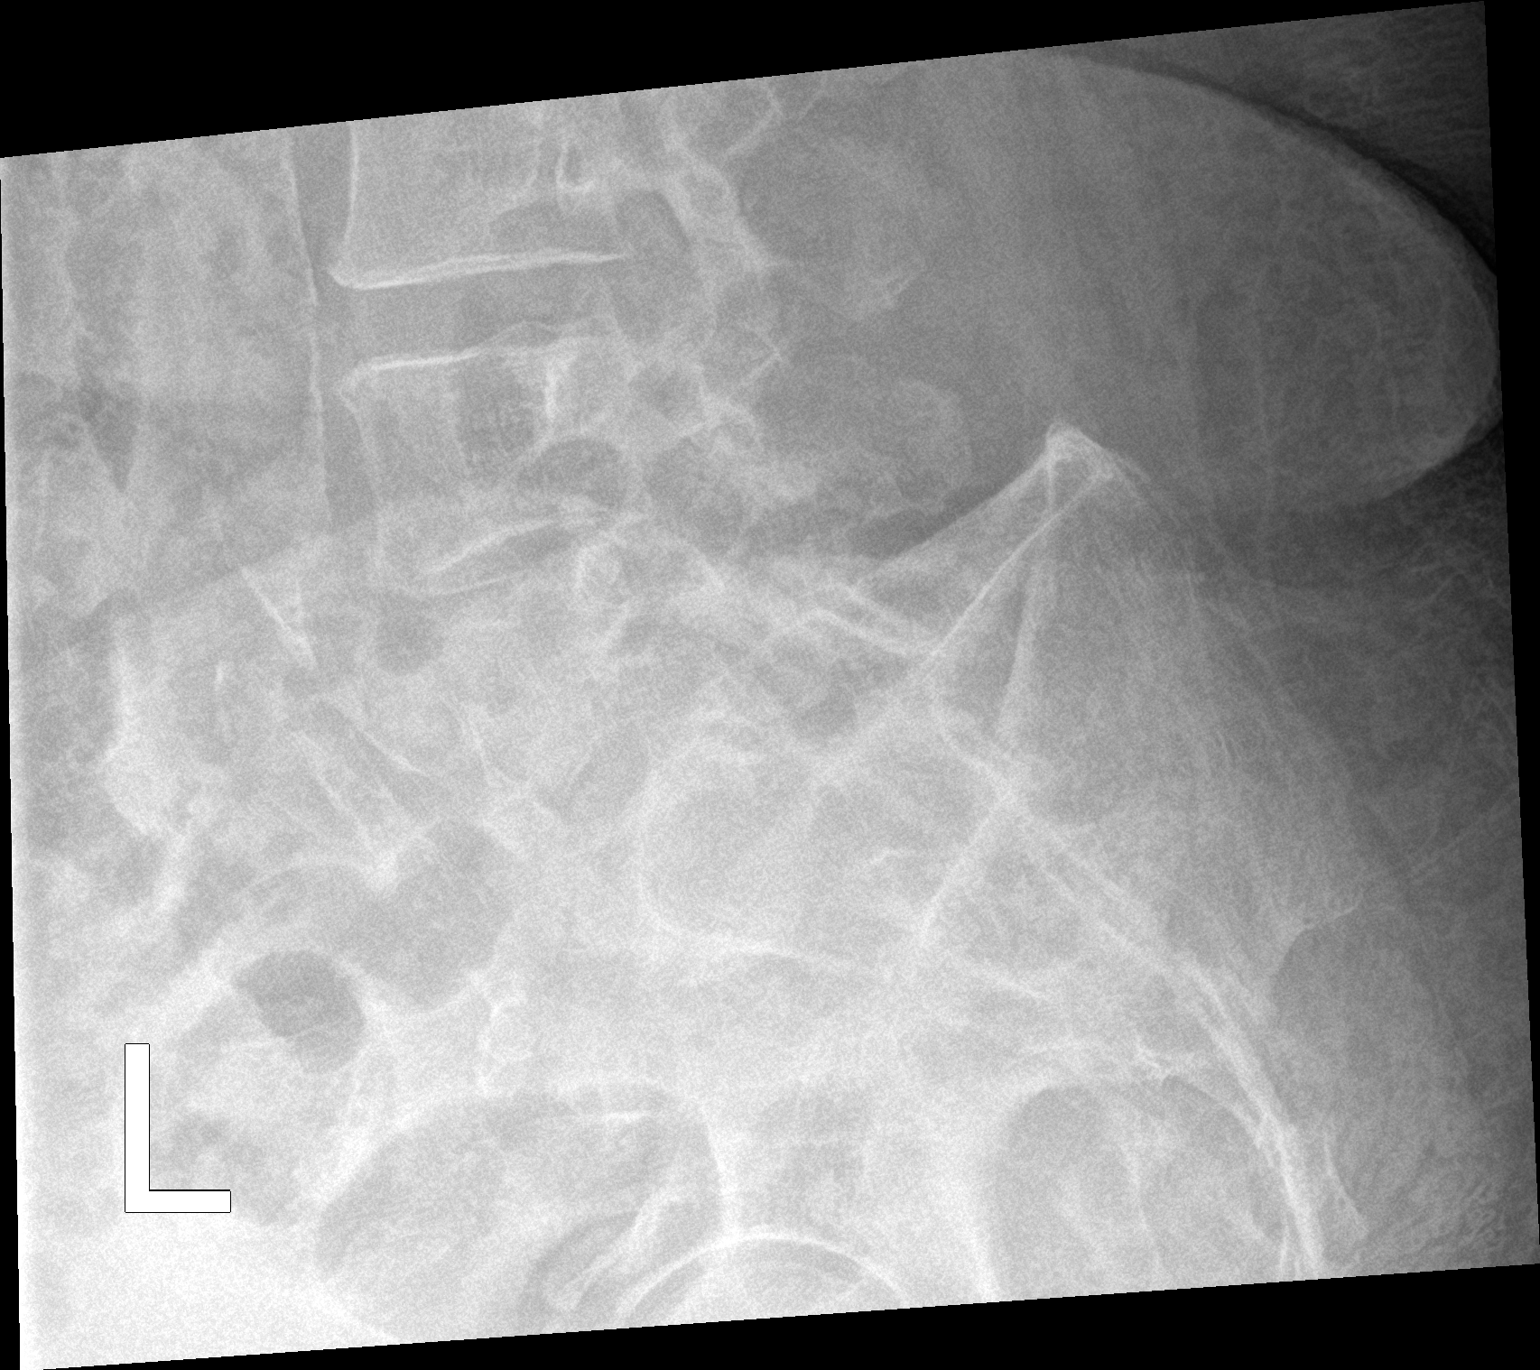

[3 of 3 positions shown; findings below may reference images not displayed]

FINDINGS: L5 is not well visualized on the lateral view. Within this
limitation, no fracture or traumatic malalignment identified. Facet
degenerative changes are noted. Calcified atherosclerosis is
identified in the abdominal aorta.
IMPRESSION: 1. Limited visualization of L5 on the lateral view. No fracture or
traumatic malalignment identified.
2. Facet degenerative changes.
3. Calcified atherosclerosis in the abdominal aorta.

## 2023-10-08 IMAGING — DX DG HIP (WITH OR WITHOUT PELVIS) 2-3V*R*
2 series · 3 of 3 positions shown · non-contrast
Comparison: None Available.

CLINICAL DATA: Pain.

EXAM:
DG HIP (WITH OR WITHOUT PELVIS) 2-3V RIGHT

[pelvis]
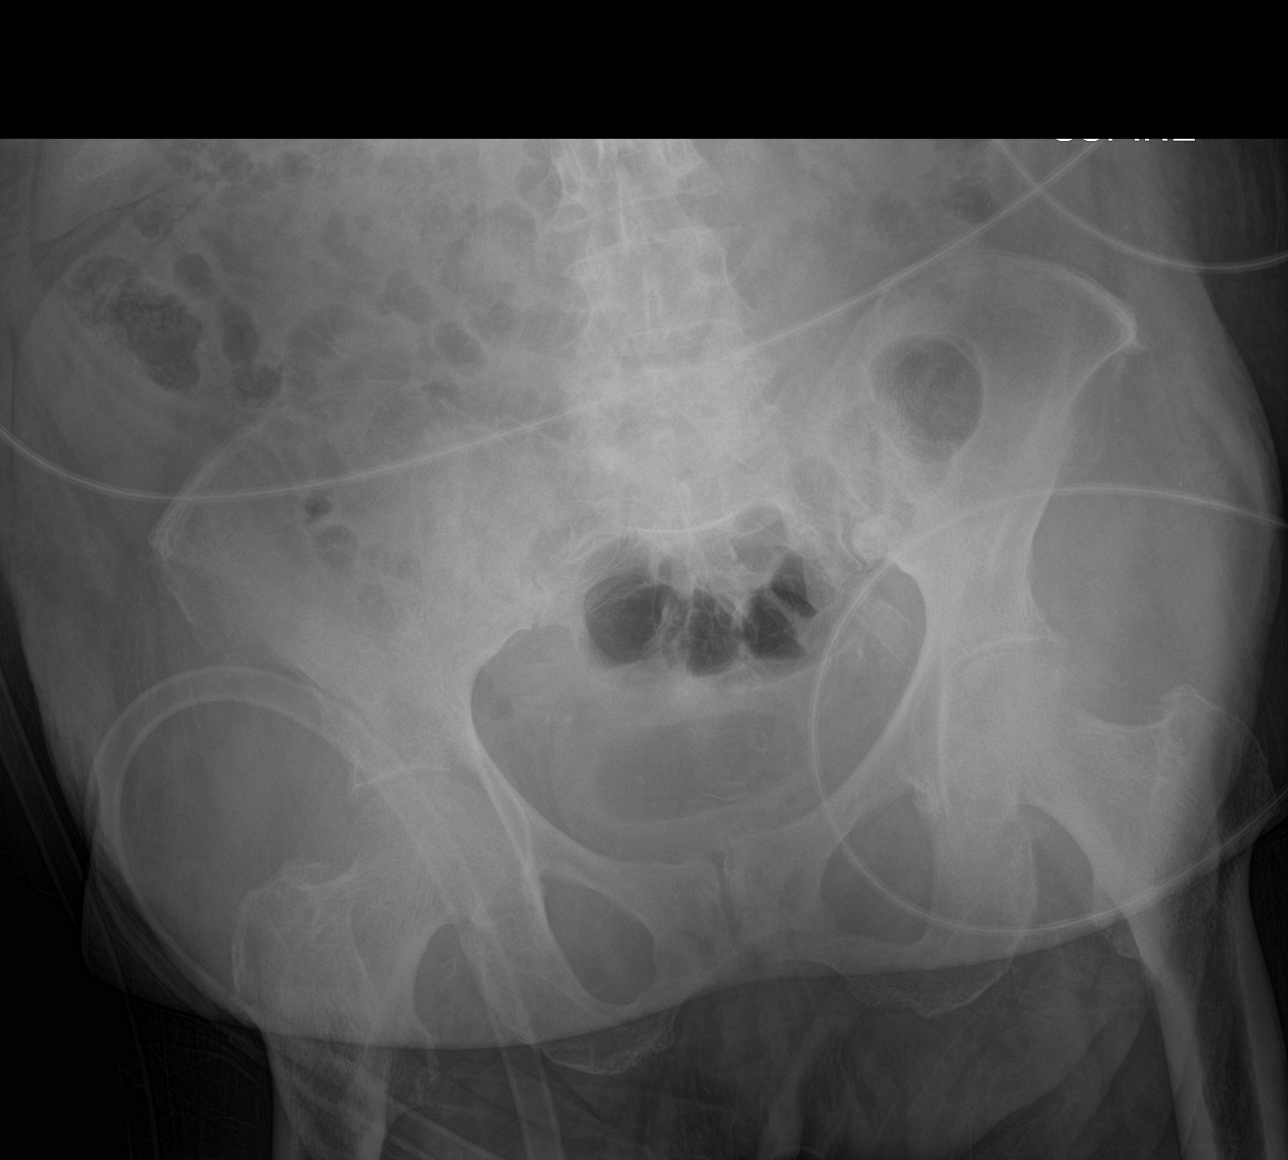

[Series 2: hip · 0.14mm/px · 2 of 2 slices shown]
[im 1/2]
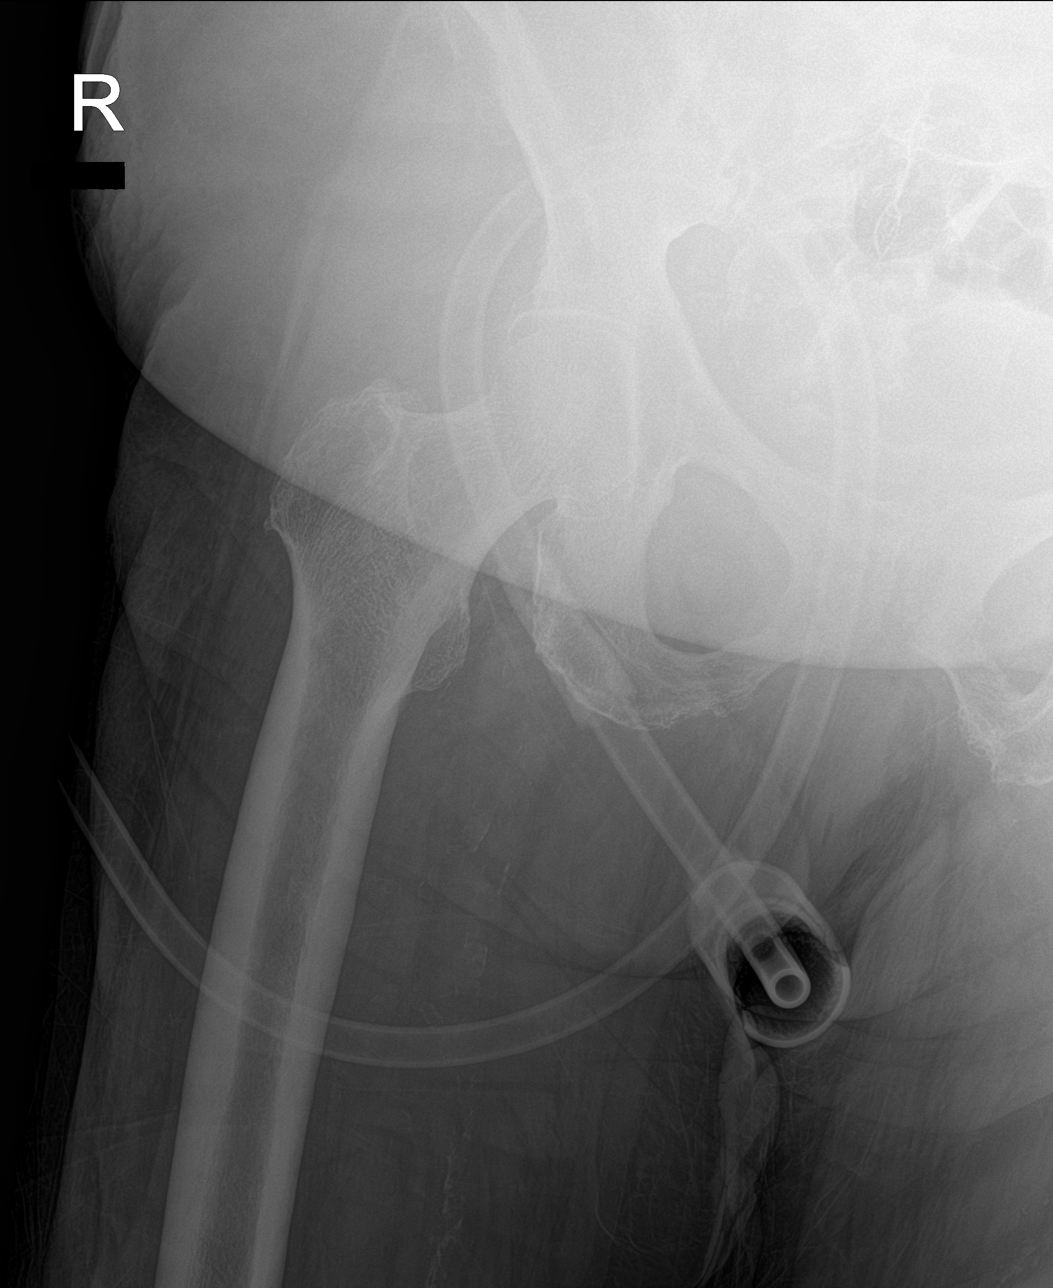
[im 2/2]
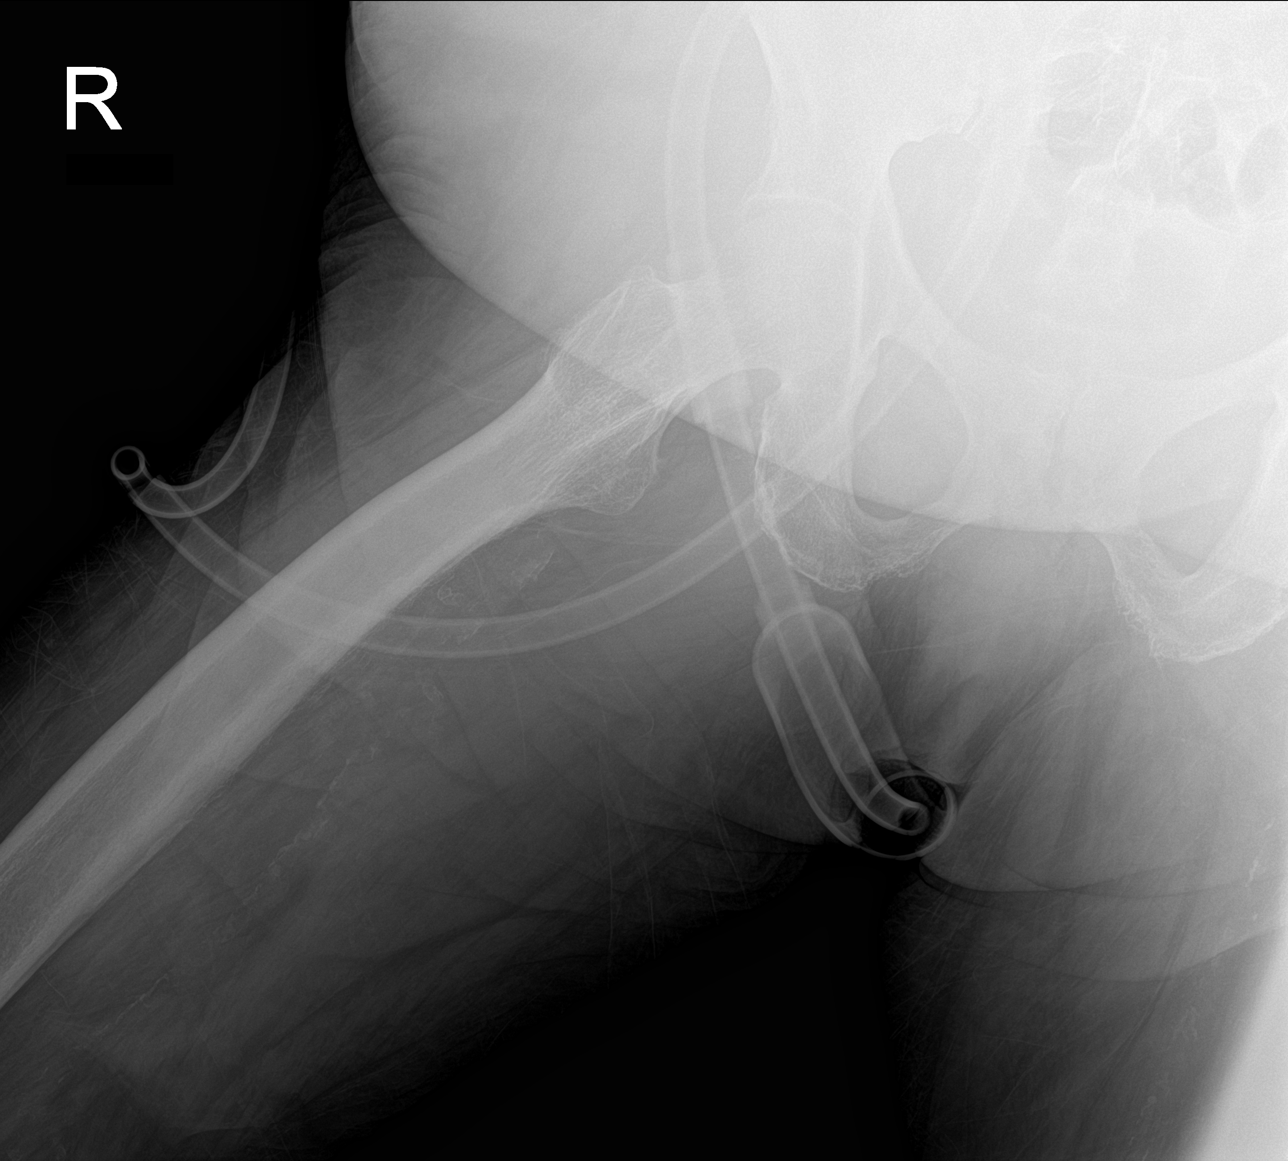

[3 of 3 positions shown; findings below may reference images not displayed]

FINDINGS: There is mild right hip joint space narrowing. The femoral head is
well seated. Mild spurring of the acetabulum. No evidence of pelvis
or right hip fracture. No erosive change or bone destruction. No
radiographic evidence of avascular necrosis. Pubic symphysis and
sacroiliac joints are congruent, both with degenerative change.
Technically limited exam due to soft tissue attenuation from
habitus. Peripheral vascular calcifications are seen.
IMPRESSION: Mild osteoarthritis of the right hip.
